# Patient Record
Sex: Female | Born: 1935 | Race: White | Hispanic: No | Marital: Married | Smoking: Former smoker
Health system: Southern US, Community
[De-identification: ages and names within clinical notes are randomized; demographics above are authoritative.]

## PROBLEM LIST (undated history)

## (undated) ENCOUNTER — Emergency Department (HOSPITAL_COMMUNITY): Admission: EM | Payer: Medicare HMO

## (undated) DIAGNOSIS — K635 Polyp of colon: Secondary | ICD-10-CM

## (undated) DIAGNOSIS — I10 Essential (primary) hypertension: Secondary | ICD-10-CM

## (undated) DIAGNOSIS — I509 Heart failure, unspecified: Secondary | ICD-10-CM

## (undated) DIAGNOSIS — K219 Gastro-esophageal reflux disease without esophagitis: Secondary | ICD-10-CM

## (undated) DIAGNOSIS — K8020 Calculus of gallbladder without cholecystitis without obstruction: Secondary | ICD-10-CM

## (undated) DIAGNOSIS — E119 Type 2 diabetes mellitus without complications: Secondary | ICD-10-CM

## (undated) DIAGNOSIS — A059 Bacterial foodborne intoxication, unspecified: Secondary | ICD-10-CM

## (undated) DIAGNOSIS — F419 Anxiety disorder, unspecified: Secondary | ICD-10-CM

## (undated) DIAGNOSIS — K5790 Diverticulosis of intestine, part unspecified, without perforation or abscess without bleeding: Secondary | ICD-10-CM

## (undated) DIAGNOSIS — C4492 Squamous cell carcinoma of skin, unspecified: Secondary | ICD-10-CM

## (undated) DIAGNOSIS — E669 Obesity, unspecified: Secondary | ICD-10-CM

## (undated) DIAGNOSIS — E785 Hyperlipidemia, unspecified: Secondary | ICD-10-CM

## (undated) DIAGNOSIS — I214 Non-ST elevation (NSTEMI) myocardial infarction: Secondary | ICD-10-CM

## (undated) HISTORY — PX: CHOLECYSTECTOMY: SHX55

## (undated) HISTORY — DX: Hyperlipidemia, unspecified: E78.5

## (undated) HISTORY — PX: ABDOMINAL HYSTERECTOMY: SHX81

## (undated) HISTORY — DX: Squamous cell carcinoma of skin, unspecified: C44.92

## (undated) HISTORY — PX: REPLACEMENT TOTAL KNEE BILATERAL: SUR1225

## (undated) HISTORY — DX: Essential (primary) hypertension: I10

## (undated) HISTORY — DX: Anxiety disorder, unspecified: F41.9

## (undated) HISTORY — DX: Non-ST elevation (NSTEMI) myocardial infarction: I21.4

## (undated) HISTORY — DX: Polyp of colon: K63.5

## (undated) HISTORY — DX: Type 2 diabetes mellitus without complications: E11.9

## (undated) HISTORY — DX: Calculus of gallbladder without cholecystitis without obstruction: K80.20

## (undated) HISTORY — DX: Gastro-esophageal reflux disease without esophagitis: K21.9

## (undated) HISTORY — DX: Diverticulosis of intestine, part unspecified, without perforation or abscess without bleeding: K57.90

## (undated) HISTORY — DX: Obesity, unspecified: E66.9

## (undated) HISTORY — DX: Bacterial foodborne intoxication, unspecified: A05.9

## (undated) HISTORY — PX: JOINT REPLACEMENT: SHX530

## (undated) HISTORY — PX: BREAST BIOPSY: SHX20

---
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
## 2015-12-25 LAB — BASIC METABOLIC PANEL
BUN: 17 (ref 4–21)
BUN: 17 (ref 4–21)
Creatinine: 0.8 (ref 0.5–1.1)
Creatinine: 0.8 (ref <=1.1)
GLUCOSE: 156
GLUCOSE: 156
Potassium: 4.7 (ref 3.4–5.3)
Potassium: 4.7 (ref 3.4–5.3)
SODIUM: 144 (ref 137–147)
SODIUM: 144 (ref 137–147)

## 2015-12-25 LAB — HEMOGLOBIN A1C
HEMOGLOBIN A1C: 7.1
Hemoglobin A1C: 7.1

## 2015-12-25 LAB — MICROALBUMIN, URINE
Microalb, Ur: 18
Microalb, Ur: 18

## 2016-07-14 LAB — HM DEXA SCAN

## 2016-07-31 LAB — BASIC METABOLIC PANEL
BUN: 14 (ref 4–21)
Creatinine: 0.8 (ref 0.5–1.1)
Glucose: 213
Potassium: 4.2 (ref 3.4–5.3)
Sodium: 142 (ref 137–147)

## 2016-07-31 LAB — CBC AND DIFFERENTIAL
HEMATOCRIT: 37 (ref 36–46)
HEMOGLOBIN: 12.7 (ref 12.0–16.0)
PLATELETS: 188 (ref 150–399)
WBC: 5.8

## 2016-07-31 LAB — HEMOGLOBIN A1C: Hemoglobin A1C: 7.4

## 2016-07-31 LAB — LIPID PANEL
Cholesterol: 101 (ref 0–200)
HDL: 33 — AB (ref 35–70)
LDL CALC: 59
TRIGLYCERIDES: 91 (ref 40–160)

## 2016-07-31 LAB — HEPATIC FUNCTION PANEL
ALK PHOS: 98 (ref 25–125)
ALT: 22 (ref 7–35)
AST: 17 (ref 13–35)
BILIRUBIN, TOTAL: 0.2

## 2016-10-19 ENCOUNTER — Ambulatory Visit: Payer: Self-pay | Admitting: Family Medicine

## 2017-01-13 LAB — CBC AND DIFFERENTIAL
HCT: 41 (ref 36–46)
Hemoglobin: 13 (ref 12.0–16.0)
Platelets: 220 (ref 150–399)
WBC: 10

## 2017-01-13 LAB — BASIC METABOLIC PANEL
BUN: 17 (ref 4–21)
CREATININE: 0.9 (ref 0.5–1.1)
GLUCOSE: 162
Potassium: 4 (ref 3.4–5.3)
Sodium: 140 (ref 137–147)

## 2017-01-13 LAB — HEPATIC FUNCTION PANEL
ALK PHOS: 93 (ref 25–125)
ALT: 32 (ref 7–35)
AST: 28 (ref 13–35)
BILIRUBIN, TOTAL: 0.4

## 2017-01-13 LAB — HEMOGLOBIN A1C: Hemoglobin A1C: 8.1

## 2017-01-13 LAB — TSH: TSH: 2.35 (ref 0.41–5.90)

## 2017-04-22 ENCOUNTER — Encounter: Payer: Self-pay | Admitting: Adult Health

## 2017-04-22 ENCOUNTER — Ambulatory Visit (INDEPENDENT_AMBULATORY_CARE_PROVIDER_SITE_OTHER): Payer: Medicare Other | Admitting: Adult Health

## 2017-04-22 DIAGNOSIS — E785 Hyperlipidemia, unspecified: Secondary | ICD-10-CM | POA: Insufficient documentation

## 2017-04-22 DIAGNOSIS — K219 Gastro-esophageal reflux disease without esophagitis: Secondary | ICD-10-CM | POA: Insufficient documentation

## 2017-04-22 DIAGNOSIS — F419 Anxiety disorder, unspecified: Secondary | ICD-10-CM | POA: Diagnosis not present

## 2017-04-22 DIAGNOSIS — E1159 Type 2 diabetes mellitus with other circulatory complications: Secondary | ICD-10-CM | POA: Insufficient documentation

## 2017-04-22 DIAGNOSIS — R7309 Other abnormal glucose: Secondary | ICD-10-CM | POA: Diagnosis not present

## 2017-04-22 DIAGNOSIS — I10 Essential (primary) hypertension: Secondary | ICD-10-CM | POA: Insufficient documentation

## 2017-04-22 MED ORDER — LORAZEPAM 1 MG PO TABS: 1.0000 mg | ORAL_TABLET | Freq: Every day | ORAL | 0 refills | Status: DC | PRN

## 2017-04-22 NOTE — Assessment & Plan Note (Signed)
She has been pre-diabetic > 5 years then last A1c April 2018 was >8-her Metformin was increased to 1000mg  Q HS-she is tolerating well and denies SE/hypoglycemia. Will re-check A1c 2 months and will adjust meds as indicated. Provided info on Diabetic diet and instructed to check AM BS and bring log at next appt.

## 2017-04-22 NOTE — Progress Notes (Signed)
Subjective:    Patient ID: Andrea Brooks, female    DOB: 20-Apr-1936, 81 y.o.   MRN: [id number]  HPI:  [name] presents to establish as a new pt.  She is a very pleasant 81 year old female.  PMH:  HTN, GERD, HL, impaired glucose metabolism, and anxiety.  She recently moved from [place] and is living with her daughter while their home is being renovated-which has caused acute increase in daily stress/anxiety.  She has been taking Lorazepam > 15 years and denies sedation or abuse of med (she has not record in [place] Controlled Substance Database).  She denies tobacco/ETOH use.  She has been pre-diabetic > 5 years then last A1c April 2018 was >8-her Metformin was increased to 1000mg  Q HS-she is tolerating well and denies SE/hypoglycemia.  She denies acute pain and only active compliant and anxiety.  Patient Care Team    Relationship Specialty Notifications Start End  Esaw Grandchild, NP PCP - General Family Medicine  04/22/17     Patient Active Problem List   Diagnosis Date Noted  . Hyperlipidemia 04/22/2017  . GERD (gastroesophageal reflux disease) 04/22/2017  . Impaired glucose metabolism 04/22/2017  . HTN (hypertension) 04/22/2017  . Anxiety 04/22/2017     Past Medical History:  Diagnosis Date  . Diabetes mellitus without complication ([place])   . Hyperlipidemia   . Hypertension      Past Surgical History:  Procedure Laterality Date  . ABDOMINAL HYSTERECTOMY     total  . CHOLECYSTECTOMY    . JOINT REPLACEMENT Bilateral    knee     Family History  Problem Relation Age of Onset  . Cancer Mother        colon  . Hypertension Mother   . Stroke Father   . Stroke Sister   . Stroke Brother   . Graves' disease Daughter   . Cancer Paternal Aunt        breast  . Graves' disease Sister      History  Drug Use No     History  Alcohol Use No     History  Smoking Status  . Former Smoker  . Packs/day: 1.00  . Years: 2.00  . Quit date: 11/24/1959  Smokeless Tobacco   . Never Used     Outpatient Encounter Prescriptions as of 04/22/2017  Medication Sig  . aspirin EC 81 MG tablet Take 81 mg by mouth daily.  Marland Kitchen ezetimibe-simvastatin (VYTORIN) 10-20 MG tablet Take 1 tablet by mouth daily.  Marland Kitchen LORazepam (ATIVAN) 1 MG tablet Take 1 tablet (1 mg total) by mouth daily as needed for anxiety.  . metFORMIN (GLUCOPHAGE) 500 MG tablet Take 1,000 mg by mouth 2 (two) times daily with a meal.  . omeprazole (PRILOSEC) 10 MG capsule Take 10 mg by mouth daily as needed.  . valsartan (DIOVAN) 160 MG tablet Take 160 mg by mouth daily.  . [DISCONTINUED] LORazepam (ATIVAN) 1 MG tablet Take 1 mg by mouth every 8 (eight) hours as needed for anxiety.   No facility-administered encounter medications on file as of 04/22/2017.     Allergies: Patient has no known allergies.  Body mass index is 35.72 kg/m.  Blood pressure (!) 147/83, pulse 86, height 5' 3.75" (1.619 m), weight 206 lb 8 oz (93.7 kg).     Review of Systems  Constitutional: Positive for fatigue. Negative for activity change, appetite change, chills, diaphoresis, fever and unexpected weight change.  HENT: Negative for congestion.   Eyes: Negative  for visual disturbance.  Respiratory: Negative for cough, chest tightness, shortness of breath, wheezing and stridor.   Cardiovascular: Negative for chest pain, palpitations and leg swelling.  Gastrointestinal: Negative for abdominal distention, abdominal pain, blood in stool, constipation, diarrhea, nausea and vomiting.  Endocrine: Negative for cold intolerance, heat intolerance, polydipsia, polyphagia and polyuria.  Genitourinary: Negative for difficulty urinating, flank pain and hematuria.  Musculoskeletal: Positive for arthralgias, joint swelling and myalgias. Negative for back pain, gait problem, neck pain and neck stiffness.  Skin: Negative for color change, pallor, rash and wound.  Allergic/Immunologic: Negative for immunocompromised state.  Neurological:  Negative for dizziness, tremors, weakness, light-headedness and headaches.  Hematological: Does not bruise/bleed easily.  Psychiatric/Behavioral: Negative for agitation, behavioral problems, confusion, decreased concentration, dysphoric mood, hallucinations, self-injury, sleep disturbance and suicidal ideas. The patient is nervous/anxious. The patient is not hyperactive.        Objective:   Physical Exam  Constitutional: She is oriented to person, place, and time. She appears well-developed and well-nourished. No distress.  HENT:  Head: Normocephalic and atraumatic.  Right Ear: External ear normal.  Left Ear: External ear normal.  Eyes: Conjunctivae are normal. Pupils are equal, round, and reactive to light.  Neck: Normal range of motion. Neck supple.  Cardiovascular: Normal rate, regular rhythm, normal heart sounds and intact distal pulses.   No murmur heard. Pulmonary/Chest: Effort normal and breath sounds normal. No respiratory distress. She has no wheezes. She has no rales. She exhibits no tenderness.  Lymphadenopathy:    She has no cervical adenopathy.  Neurological: She is alert and oriented to person, place, and time. Coordination normal.  Skin: Skin is warm and dry. No rash noted. She is not diaphoretic. No erythema. No pallor.  Psychiatric: She has a normal mood and affect. Her behavior is normal. Judgment and thought content normal.  Nursing note and vitals reviewed.         Assessment & Plan:   1. Hyperlipidemia, unspecified hyperlipidemia type   2. Gastroesophageal reflux disease, esophagitis presence not specified   3. Impaired glucose metabolism   4. Essential hypertension   5. Anxiety     Impaired glucose metabolism She has been pre-diabetic > 5 years then last A1c April 2018 was >8-her Metformin was increased to 1000mg  Q HS-she is tolerating well and denies SE/hypoglycemia. Will re-check A1c 2 months and will adjust meds as indicated. Provided info on  Diabetic diet and instructed to check AM BS and bring log at next appt.  GERD (gastroesophageal reflux disease) Continue Omeprazole daily. Avoid acidic foods.  Hyperlipidemia Continue Vytorin 10/20 daily. Will obtain last lipid panel from previous PCP.  HTN (hypertension) Continue valsartan 160mg  daily. Reduce sodium intake, increase water intake.  Anxiety  She has been taking Lorazepam > 15 years and denies sedation or abuse of med (she has not record in [place] Controlled Substance Database).  She denies tobacco/ETOH use. Recent move has increased acute stress/anxiety. Discussed this class of medication is not recommended for geriatric pt's. Pt prefers to continue since med control sx's well and she has not had SE. 30d Lorazepam Rx provided.    FOLLOW-UP:  Return in about 2 months (around 06/22/2017) for Regular Follow Up, HTN, Diabetes, General Anxiety Disorder.

## 2017-04-22 NOTE — Patient Instructions (Addendum)
Carbohydrate Counting for Diabetes Mellitus, Adult Carbohydrate counting is a method for keeping track of how many carbohydrates you eat. Eating carbohydrates naturally increases the amount of sugar (glucose) in the blood. Counting how many carbohydrates you eat helps keep your blood glucose within normal limits, which helps you manage your diabetes (diabetes mellitus). It is important to know how many carbohydrates you can safely have in each meal. This is different for every person. A diet and nutrition specialist (registered dietitian) can help you make a meal plan and calculate how many carbohydrates you should have at each meal and snack. Carbohydrates are found in the following foods:  Grains, such as breads and cereals.  Dried beans and soy products.  Starchy vegetables, such as potatoes, peas, and corn.  Fruit and fruit juices.  Milk and yogurt.  Sweets and snack foods, such as cake, cookies, candy, chips, and soft drinks.  How do I count carbohydrates? There are two ways to count carbohydrates in food. You can use either of the methods or a combination of both. Reading "Nutrition Facts" on packaged food The "Nutrition Facts" list is included on the labels of almost all packaged foods and beverages in the [place] It includes:  The serving size.  Information about nutrients in each serving, including the grams (g) of carbohydrate per serving.  To use the "Nutrition Facts":  Decide how many servings you will have.  Multiply the number of servings by the number of carbohydrates per serving.  The resulting number is the total amount of carbohydrates that you will be having.  Learning standard serving sizes of other foods When you eat foods containing carbohydrates that are not packaged or do not include "Nutrition Facts" on the label, you need to measure the servings in order to count the amount of carbohydrates:  Measure the foods that you will eat with a food scale or  measuring cup, if needed.  Decide how many standard-size servings you will eat.  Multiply the number of servings by 15. Most carbohydrate-rich foods have about 15 g of carbohydrates per serving. ? For example, if you eat 8 oz (170 g) of strawberries, you will have eaten 2 servings and 30 g of carbohydrates (2 servings x 15 g = 30 g).  For foods that have more than one food mixed, such as soups and casseroles, you must count the carbohydrates in each food that is included.  The following list contains standard serving sizes of common carbohydrate-rich foods. Each of these servings has about 15 g of carbohydrates:   hamburger bun or  English muffin.   oz (15 mL) syrup.   oz (14 g) jelly.  1 slice of bread.  1 six-inch tortilla.  3 oz (85 g) cooked rice or pasta.  4 oz (113 g) cooked dried beans.  4 oz (113 g) starchy vegetable, such as peas, corn, or potatoes.  4 oz (113 g) hot cereal.  4 oz (113 g) mashed potatoes or  of a large baked potato.  4 oz (113 g) canned or frozen fruit.  4 oz (120 mL) fruit juice.  4-6 crackers.  6 chicken nuggets.  6 oz (170 g) unsweetened dry cereal.  6 oz (170 g) plain fat-free yogurt or yogurt sweetened with artificial sweeteners.  8 oz (240 mL) milk.  8 oz (170 g) fresh fruit or one small piece of fruit.  24 oz (680 g) popped popcorn.  Example of carbohydrate counting Sample meal  3 oz (85 g) chicken breast.    6 oz (170 g) brown rice.  4 oz (113 g) corn.  8 oz (240 mL) milk.  8 oz (170 g) strawberries with sugar-free whipped topping. Carbohydrate calculation 1. Identify the foods that contain carbohydrates: ? Rice. ? Corn. ? Milk. ? Strawberries. 2. Calculate how many servings you have of each food: ? 2 servings rice. ? 1 serving corn. ? 1 serving milk. ? 1 serving strawberries. 3. Multiply each number of servings by 15 g: ? 2 servings rice x 15 g = 30 g. ? 1 serving corn x 15 g = 15 g. ? 1 serving milk x 15  g = 15 g. ? 1 serving strawberries x 15 g = 15 g. 4. Add together all of the amounts to find the total grams of carbohydrates eaten: ? 30 g + 15 g + 15 g + 15 g = 75 g of carbohydrates total. This information is not intended to replace advice given to you by your health care provider. Make sure you discuss any questions you have with your health care provider. Document Released: 11/09/2005 Document Revised: 05/29/2016 Document Reviewed: 04/22/2016 Elsevier Interactive Patient Education  2018 [place].   Blood Glucose Monitoring, Adult Monitoring your blood sugar (glucose) helps you manage your diabetes. It also helps you and your health care provider determine how well your diabetes management plan is working. Blood glucose monitoring involves checking your blood glucose as often as directed, and keeping a record (log) of your results over time. Why should I monitor my blood glucose? Checking your blood glucose regularly can:  Help you understand how food, exercise, illnesses, and medicines affect your blood glucose.  Let you know what your blood glucose is at any time. You can quickly tell if you are having low blood glucose (hypoglycemia) or high blood glucose (hyperglycemia).  Help you and your health care provider adjust your medicines as needed.  When should I check my blood glucose? Follow instructions from your health care provider about how often to check your blood glucose. This may depend on:  The type of diabetes you have.  How well-controlled your diabetes is.  Medicines you are taking.  If you have type 1 diabetes:  Check your blood glucose at least 2 times a day.  Also check your blood glucose: ? Before every insulin injection. ? Before and after exercise. ? Between meals. ? 2 hours after a meal. ? Occasionally between 2:00 a.m. and 3:00 a.m., as directed. ? Before potentially dangerous tasks, like driving or using heavy machinery. ? At bedtime.  You may  need to check your blood glucose more often, up to 6-10 times a day: ? If you use an insulin pump. ? If you need multiple daily injections (MDI). ? If your diabetes is not well-controlled. ? If you are ill. ? If you have a history of severe hypoglycemia. ? If you have a history of not knowing when your blood glucose is getting low (hypoglycemia unawareness). If you have type 2 diabetes:  If you take insulin or other diabetes medicines, check your blood glucose at least 2 times a day.  If you are on intensive insulin therapy, check your blood glucose at least 4 times a day. Occasionally, you may also need to check between 2:00 a.m. and 3:00 a.m., as directed.  Also check your blood glucose: ? Before and after exercise. ? Before potentially dangerous tasks, like driving or using heavy machinery.  You may need to check your blood glucose more often  if: ? Your medicine is being adjusted. ? Your diabetes is not well-controlled. ? You are ill. What is a blood glucose log?  A blood glucose log is a record of your blood glucose readings. It helps you and your health care provider: ? Look for patterns in your blood glucose over time. ? Adjust your diabetes management plan as needed.  Every time you check your blood glucose, write down your result and notes about things that may be affecting your blood glucose, such as your diet and exercise for the day.  Most glucose meters store a record of glucose readings in the meter. Some meters allow you to download your records to a computer. How do I check my blood glucose? Follow these steps to get accurate readings of your blood glucose: Supplies needed   Blood glucose meter.  Test strips for your meter. Each meter has its own strips. You must use the strips that come with your meter.  A needle to prick your finger (lancet). Do not use lancets more than once.  A device that holds the lancet (lancing device).  A journal or log book to write  down your results. Procedure  Wash your hands with soap and water.  Prick the side of your finger (not the tip) with the lancet. Use a different finger each time.  Gently rub the finger until a small drop of blood appears.  Follow instructions that come with your meter for inserting the test strip, applying blood to the strip, and using your blood glucose meter.  Write down your result and any notes. Alternative testing sites  Some meters allow you to use areas of your body other than your finger (alternative sites) to test your blood.  If you think you may have hypoglycemia, or if you have hypoglycemia unawareness, do not use alternative sites. Use your finger instead.  Alternative sites may not be as accurate as the fingers, because blood flow is slower in these areas. This means that the result you get may be delayed, and it may be different from the result that you would get from your finger.  The most common alternative sites are: ? Forearm. ? Thigh. ? Palm of the hand. Additional tips  Always keep your supplies with you.  If you have questions or need help, all blood glucose meters have a 24-hour "hotline" number that you can call. You may also contact your health care provider.  After you use a few boxes of test strips, adjust (calibrate) your blood glucose meter by following instructions that came with your meter. This information is not intended to replace advice given to you by your health care provider. Make sure you discuss any questions you have with your health care provider.   Document Released: 11/12/2003 Document Revised: 05/29/2016 Document Reviewed: 04/20/2016 Elsevier Interactive Patient Education  2017 [place] all medications as directed. [place] for Lorazepam provided. Please bring blood sugar log with you at next appt in 2 months. Please call clinic with any questions/concerns.

## 2017-04-22 NOTE — Assessment & Plan Note (Signed)
Continue Vytorin 10/20 daily. Will obtain last lipid panel from previous PCP.

## 2017-04-22 NOTE — Assessment & Plan Note (Addendum)
She has been taking Lorazepam > 15 years and denies sedation or abuse of med (she has not record in [place] Controlled Substance Database).  She denies tobacco/ETOH use. Recent move has increased acute stress/anxiety. Discussed this class of medication is not recommended for geriatric pt's. Pt prefers to continue since med control sx's well and she has not had SE. 30d Lorazepam Rx provided.

## 2017-04-22 NOTE — Assessment & Plan Note (Signed)
Continue valsartan 160mg  daily. Reduce sodium intake, increase water intake.

## 2017-04-22 NOTE — Assessment & Plan Note (Signed)
Continue Omeprazole daily. Avoid acidic foods.

## 2017-05-20 ENCOUNTER — Telehealth: Payer: Self-pay

## 2017-05-20 NOTE — Telephone Encounter (Signed)
Prior authorization for lorazepam done via cover my meds.  Medication approved, pharmacy aware.

## 2017-05-27 LAB — HM DIABETES EYE EXAM

## 2017-06-24 ENCOUNTER — Encounter: Payer: Self-pay | Admitting: Adult Health

## 2017-06-24 ENCOUNTER — Ambulatory Visit (INDEPENDENT_AMBULATORY_CARE_PROVIDER_SITE_OTHER): Payer: Medicare Other | Admitting: Adult Health

## 2017-06-24 VITALS — BP 134/74 | HR 77 | Ht 63.75 in | Wt 197.2 lb

## 2017-06-24 DIAGNOSIS — M546 Pain in thoracic spine: Secondary | ICD-10-CM | POA: Insufficient documentation

## 2017-06-24 DIAGNOSIS — I10 Essential (primary) hypertension: Secondary | ICD-10-CM | POA: Diagnosis not present

## 2017-06-24 DIAGNOSIS — E785 Hyperlipidemia, unspecified: Secondary | ICD-10-CM | POA: Diagnosis not present

## 2017-06-24 DIAGNOSIS — R7309 Other abnormal glucose: Secondary | ICD-10-CM | POA: Diagnosis not present

## 2017-06-24 DIAGNOSIS — R195 Other fecal abnormalities: Secondary | ICD-10-CM | POA: Insufficient documentation

## 2017-06-24 DIAGNOSIS — F419 Anxiety disorder, unspecified: Secondary | ICD-10-CM | POA: Diagnosis not present

## 2017-06-24 DIAGNOSIS — G8929 Other chronic pain: Secondary | ICD-10-CM

## 2017-06-24 LAB — POCT GLYCOSYLATED HEMOGLOBIN (HGB A1C): Hemoglobin A1C: 6.4

## 2017-06-24 NOTE — Assessment & Plan Note (Signed)
BP at goal 134/74 Continue valsartan 160mg  daily. Denies cardiac sx's at present.

## 2017-06-24 NOTE — Assessment & Plan Note (Signed)
Continue heart healthy diet and still awaiting labs from previous PCP. CPE in 4 months.

## 2017-06-24 NOTE — Assessment & Plan Note (Addendum)
A1c today 6.4 Reduced Metformin 500mg  2 tabs BID to 1 tab BID. She has lost 10lbs since last OV Continue heart healthy diet and daily walking. Diabetic foot exam normal today. Will re-check A1c in 4 months at her CPE.

## 2017-06-24 NOTE — Assessment & Plan Note (Signed)
Very well controlled, only required single dose of Lorazepam 1mg  in May 2018.

## 2017-06-24 NOTE — Assessment & Plan Note (Signed)
She was hospitalized 2014 for pneumonia and strong/constant cough caused R thoracic pain that eventually resolved, since then it will re-emerge with strenuous exertion (ie. Lifting boxes/moving that last few months). A few weeks ago R thoracic "soreness" developed rated 8/10 she has been treating with heating pad and OTC Acetaminophen.  Since the move completed last month, the soreness resolved.  She just wanted to make mention in case is occurs again.

## 2017-06-24 NOTE — Patient Instructions (Signed)

## 2017-06-24 NOTE — Progress Notes (Signed)
Subjective:    Patient ID: Andrea Brooks, female    DOB: Jan 21, 1936, 81 y.o.   MRN: [id number]  HPI:  Andrea Brooks is here for f/u:  HTN, T2D.  She has been taking Metformin 1000mg  BID, following a heart healthy diet, performing daily lifting/walking/moving boxes due to a move/home remodel since April 2018.  She has lost 10lbs since last OV and A1c today 6.4 (last A1c >8 in Spring 2018)-GREAT JOB!  She continues to take Vytorin10/20mg , Omeprazole 10mg , Diovan 160mg  daily. She has only required one dose of Lorazepam 1mg  Q HS in May.  She and her husband now live in 1800 sq foot "in law suite" attached to her daughter's home.  She  Reports am BS running 110-130s, denies episodes of hypoglycemia.  She denies EOTH/tobacco use. Two acute complaints today: 1)   She was hospitalized 2014 for pneumonia and strong/constant cough caused R thoracic pain that eventually resolved, since then it will re-emerge with strenuous exertion (ie. Lifting boxes/moving that last few months). A few weeks ago R thoracic "soreness" developed rated 8/10 she has been treating with heating pad and OTC Acetaminophen.  Since the move completed last month, the soreness resolved.  She just wanted to make mention in case is occurs again. 2) Loose stools accompanied with "tightness around my anus" that occurs 2-3 times a week in the morning.  She denies abdominal pain, or blood in urine/stool.  Andrea Brooks' last colonoscopy revealed a polyp and her repeat screening is due 2019.  Her mother had colon ca.  She had 10 lb wt loss since last OV, however she has been improving diet, Metformin dosage was increased in Spring, and she has been moving a lot more than usual with the move, home re-model.  Patient Care Team    Relationship Specialty Notifications Start End  Esaw Grandchild, NP PCP - General Family Medicine  04/22/17     Patient Active Problem List   Diagnosis Date Noted  . Loose stools 06/24/2017  . Right-sided thoracic back pain  06/24/2017  . Hyperlipidemia 04/22/2017  . GERD (gastroesophageal reflux disease) 04/22/2017  . Impaired glucose metabolism 04/22/2017  . HTN (hypertension) 04/22/2017  . Anxiety 04/22/2017     Past Medical History:  Diagnosis Date  . Diabetes mellitus without complication ([place])   . Hyperlipidemia   . Hypertension      Past Surgical History:  Procedure Laterality Date  . ABDOMINAL HYSTERECTOMY     total  . CHOLECYSTECTOMY    . JOINT REPLACEMENT Bilateral    knee     Family History  Problem Relation Age of Onset  . Cancer Mother        colon  . Hypertension Mother   . Stroke Father   . Stroke Sister   . Stroke Brother   . Graves' disease Daughter   . Cancer Paternal Aunt        breast  . Graves' disease Sister      History  Drug Use No     History  Alcohol Use No     History  Smoking Status  . Former Smoker  . Packs/day: 1.00  . Years: 2.00  . Quit date: 11/24/1959  Smokeless Tobacco  . Never Used     Outpatient Encounter Prescriptions as of 06/24/2017  Medication Sig  . aspirin EC 81 MG tablet Take 81 mg by mouth daily.  Marland Kitchen ezetimibe-simvastatin (VYTORIN) 10-20 MG tablet Take 1 tablet by mouth daily.  Marland Kitchen LORazepam (ATIVAN)  1 MG tablet Take 1 tablet (1 mg total) by mouth daily as needed for anxiety.  . metFORMIN (GLUCOPHAGE) 500 MG tablet Take 500 mg by mouth 2 (two) times daily with a meal.  . omeprazole (PRILOSEC) 10 MG capsule Take 10 mg by mouth daily as needed.  . valsartan (DIOVAN) 160 MG tablet Take 160 mg by mouth daily.   No facility-administered encounter medications on file as of 06/24/2017.     Allergies: Patient has no known allergies.  Body mass index is 34.12 kg/m.  Blood pressure 134/74, pulse 77, height 5' 3.75" (1.619 m), weight 197 lb 3.2 oz (89.4 kg).     Review of Systems  Constitutional: Positive for fatigue. Negative for activity change, appetite change, chills, diaphoresis, fever and unexpected weight change.   Eyes: Negative for visual disturbance.  Respiratory: Negative for cough, chest tightness, shortness of breath, wheezing and stridor.   Cardiovascular: Negative for chest pain, palpitations and leg swelling.  Gastrointestinal: Positive for diarrhea. Negative for abdominal distention, abdominal pain, blood in stool, constipation, nausea and vomiting.  Endocrine: Negative for cold intolerance, heat intolerance, polydipsia, polyphagia and polyuria.  Genitourinary: Negative for difficulty urinating, flank pain and hematuria.  Musculoskeletal: Negative for arthralgias, back pain, gait problem, joint swelling, myalgias, neck pain and neck stiffness.  Skin: Negative for color change, pallor, rash and wound.  Allergic/Immunologic: Negative for immunocompromised state.  Neurological: Negative for dizziness, tremors, weakness and headaches.  Hematological: Does not bruise/bleed easily.  Psychiatric/Behavioral: Negative for agitation, dysphoric mood, self-injury, sleep disturbance and suicidal ideas. The patient is not nervous/anxious.        Objective:   Physical Exam  Constitutional: She is oriented to person, place, and time. She appears well-developed and well-nourished. No distress.  HENT:  Head: Normocephalic and atraumatic.  Right Ear: External ear normal.  Left Ear: External ear normal.  Eyes: Pupils are equal, round, and reactive to light. Conjunctivae are normal.  Neck: Normal range of motion. Neck supple.  Cardiovascular: Normal rate, regular rhythm, normal heart sounds and intact distal pulses.   No murmur heard. Pulmonary/Chest: Breath sounds normal. No respiratory distress. She has no wheezes. She has no rales. She exhibits no tenderness.  Abdominal: Soft. Bowel sounds are normal. She exhibits no distension and no mass. There is no tenderness. There is no rebound and no guarding.  Musculoskeletal: Normal range of motion. She exhibits no edema, tenderness or deformity.   Lymphadenopathy:    She has no cervical adenopathy.  Neurological: She is alert and oriented to person, place, and time. Coordination normal.  Skin: Skin is warm and dry. No rash noted. She is not diaphoretic. No erythema. No pallor.  Psychiatric: She has a normal mood and affect. Her behavior is normal. Judgment and thought content normal.  Nursing note and vitals reviewed.         Assessment & Plan:   1. Impaired glucose metabolism   2. Loose stools   3. Essential hypertension   4. Hyperlipidemia, unspecified hyperlipidemia type   5. Anxiety   6. Chronic right-sided thoracic back pain     Impaired glucose metabolism A1c today 6.4 Reduced Metformin 500mg  2 tabs BID to 1 tab BID. She has lost 10lbs since last OV Continue heart healthy diet and daily walking. Diabetic foot exam normal today. Will re-check A1c in 4 months at her CPE.  HTN (hypertension) BP at goal 134/74 Continue valsartan 160mg  daily. Denies cardiac sx's at present.  Hyperlipidemia Continue heart healthy diet and still  awaiting labs from previous PCP. CPE in 4 months.  Anxiety Very well controlled, only required single dose of Lorazepam 1mg  in May 2018.  Loose stools Family hx of colon ca. GI referral placed.   Right-sided thoracic back pain She was hospitalized 2014 for pneumonia and strong/constant cough caused R thoracic pain that eventually resolved, since then it will re-emerge with strenuous exertion (ie. Lifting boxes/moving that last few months). A few weeks ago R thoracic "soreness" developed rated 8/10 she has been treating with heating pad and OTC Acetaminophen.  Since the move completed last month, the soreness resolved.  She just wanted to make mention in case is occurs again.    FOLLOW-UP:  Return in about 4 months (around 10/24/2017) for Regular Follow Up, CPE.

## 2017-06-24 NOTE — Assessment & Plan Note (Signed)
Family hx of colon ca. GI referral placed.

## 2017-06-25 ENCOUNTER — Encounter: Payer: Self-pay | Admitting: Physician Assistant

## 2017-07-06 ENCOUNTER — Ambulatory Visit (INDEPENDENT_AMBULATORY_CARE_PROVIDER_SITE_OTHER): Payer: Medicare Other | Admitting: Adult Health

## 2017-07-06 ENCOUNTER — Encounter: Payer: Self-pay | Admitting: Adult Health

## 2017-07-06 ENCOUNTER — Ambulatory Visit: Payer: Medicare Other

## 2017-07-06 VITALS — BP 112/72 | HR 76 | Ht 63.75 in | Wt 197.1 lb

## 2017-07-06 DIAGNOSIS — M79671 Pain in right foot: Secondary | ICD-10-CM | POA: Diagnosis not present

## 2017-07-06 NOTE — Patient Instructions (Addendum)
Heel Spur A heel spur is a bony growth that forms on the bottom of your heel bone (calcaneus). Heel spurs are common and do not always cause pain. However, heel spurs often cause inflammation in the strong band of tissue that runs underneath the bone of your foot (plantar fascia). When this happens, you may feel pain on the bottom of your foot, near your heel. What are the causes? The cause of heel spurs is not completely understood. They may be caused by pressure on the heel. Or, they may stem from the muscle attachments (tendons) near the spur pulling on the heel. What increases the risk? You may be at risk for a heel spur if you:  Are older than 40.  Are overweight.  Have wear and tear arthritis (osteoarthritis).  Have plantar fascia inflammation.  What are the signs or symptoms? Some people have heel spurs but no symptoms. If you do have symptoms, they may include:  Pain in the bottom of your heel.  Pain that is worse when you first get out of bed.  Pain that gets worse after walking or standing.  How is this diagnosed? Your health care provider may diagnose a heel spur based on your symptoms and a physical exam. You may also have an X-ray of your foot to check for a bony growth coming from the calcaneus. How is this treated? Treatment aims to relieve the pain from the heel spur. This may include:  Stretching exercises.  Losing weight.  Wearing specific shoes, inserts, or orthotics for comfort and support.  Wearing splints at night to properly position your feet.  Taking over-the-counter medicine to relieve pain.  Being treated with high-intensity sound waves to break up the heel spur (extracorporeal shock wave therapy).  Getting steroid injections in your heel to reduce swelling and ease pain.  Having surgery if your heel spur causes long-term (chronic) pain.  Follow these instructions at home:  Take medicines only as directed by your health care provider.  Ask  your health care provider if you should use ice or cold packs on the painful areas of your heel or foot.  Avoid activities that cause you pain until you recover or as directed by your health care provider.  Stretch before exercising or being physically active.  Wear supportive shoes that fit well as directed by your health care provider. You might need to buy new shoes. Wearing old shoes or shoes that do not fit correctly may not provide the support that you need.  Lose weight if your health care provider thinks you should. This can relieve pressure on your foot that may be causing pain and discomfort. Contact a health care provider if:  Your pain continues or gets worse. This information is not intended to replace advice given to you by your health care provider. Make sure you discuss any questions you have with your health care provider. Document Released: 12/16/2005 Document Revised: 04/16/2016 Document Reviewed: 01/10/2014 Elsevier Interactive Patient Education  [employer].   We will call when xray results are available. Apply ice to R heel for 20 mins several times daily. Rest as needed. Alternate OTC Acetaminophen and Aleve per manufacturer's instructions. If symptoms worsen/persist, then please call clinic. NICE TO SEE YOU!

## 2017-07-06 NOTE — Progress Notes (Signed)
Subjective:    Patient ID: Andrea Brooks, female    DOB: 1936-06-10, 81 y.o.   MRN: [id number]  HPI:  Ms. Hintze presents with R heel pain the spontaneously began > 2 weeks ago.  She denies acute injury/accident prior to onset of pain.  The pain is localized over heel and most pronounced with the first steps after waking and standing up from seated position.  Pain is rated at 8/10 and described as a dull ache.  She was previously dx'd with plantar fasciitis a few months ago and sx's successfully treated with "frozen water bottle" rolling.  She has been resting/elevating/OTC Aleve to tx pain. She denies this type of pain ever occurring before. Husband at [facility] during [place].  Patient Care Team    Relationship Specialty Notifications Start End  Esaw Grandchild, NP PCP - General Family Medicine  04/22/17     Patient Active Problem List   Diagnosis Date Noted  . Pain of right heel 07/06/2017  . Loose stools 06/24/2017  . Right-sided thoracic back pain 06/24/2017  . Hyperlipidemia 04/22/2017  . GERD (gastroesophageal reflux disease) 04/22/2017  . Impaired glucose metabolism 04/22/2017  . HTN (hypertension) 04/22/2017  . Anxiety 04/22/2017     Past Medical History:  Diagnosis Date  . Diabetes mellitus without complication ([place])   . Hyperlipidemia   . Hypertension      Past Surgical History:  Procedure Laterality Date  . ABDOMINAL HYSTERECTOMY     total  . CHOLECYSTECTOMY    . JOINT REPLACEMENT Bilateral    knee     Family History  Problem Relation Age of Onset  . Cancer Mother        colon  . Hypertension Mother   . Stroke Father   . Stroke Sister   . Stroke Brother   . Graves' disease Daughter   . Cancer Paternal Aunt        breast  . Graves' disease Sister      History  Drug Use No     History  Alcohol Use No     History  Smoking Status  . Former Smoker  . Packs/day: 1.00  . Years: 2.00  . Quit date: 11/24/1959  Smokeless Tobacco  . Never Used      Outpatient Encounter Prescriptions as of 07/06/2017  Medication Sig  . aspirin EC 81 MG tablet Take 81 mg by mouth daily.  Marland Kitchen ezetimibe-simvastatin (VYTORIN) 10-20 MG tablet Take 1 tablet by mouth daily.  Marland Kitchen LORazepam (ATIVAN) 1 MG tablet Take 1 tablet (1 mg total) by mouth daily as needed for anxiety.  . metFORMIN (GLUCOPHAGE) 500 MG tablet Take 500 mg by mouth 2 (two) times daily with a meal.  . omeprazole (PRILOSEC) 10 MG capsule Take 10 mg by mouth daily as needed.  . valsartan (DIOVAN) 160 MG tablet Take 160 mg by mouth daily.   No facility-administered encounter medications on file as of 07/06/2017.     Allergies: Patient has no known allergies.  Body mass index is 34.1 kg/m.  Blood pressure 112/72, pulse 76, height 5' 3.75" (1.619 m), weight 197 lb 1.6 oz (89.4 kg).    Review of Systems  Constitutional: Positive for activity change and fatigue. Negative for appetite change, chills, diaphoresis, fever and unexpected weight change.  Respiratory: Negative for cough, chest tightness, shortness of breath, wheezing and stridor.   Cardiovascular: Negative for chest pain, palpitations and leg swelling.  Musculoskeletal: Positive for arthralgias, gait problem and myalgias. Negative for  back pain, joint swelling, neck pain and neck stiffness.  Hematological: Does not bruise/bleed easily.  Psychiatric/Behavioral: Positive for sleep disturbance.       Objective:   Physical Exam  Constitutional: She is oriented to person, place, and time. She appears well-developed and well-nourished. No distress.  HENT:  Head: Normocephalic and atraumatic.  Right Ear: External ear normal.  Left Ear: External ear normal.  Eyes: Pupils are equal, round, and reactive to light. Conjunctivae are normal.  Musculoskeletal: Normal range of motion. She exhibits tenderness. She exhibits no edema or deformity.       Right ankle: Achilles tendon exhibits no pain, no defect and normal Thompson's test  results.       Right foot: There is tenderness and bony tenderness. There is normal range of motion, no swelling, normal capillary refill, no crepitus, no deformity and no laceration.       Feet:  TTP over heel  Neurological: She is alert and oriented to person, place, and time. Coordination normal.  Skin: Skin is warm and dry. No rash noted. She is not diaphoretic. No erythema. No pallor.  Psychiatric: She has a normal mood and affect. Her behavior is normal. Judgment and thought content normal.          Assessment & Plan:   1. Pain of right heel     Pain of right heel XRay Results: FINDINGS: AP and lateral views of the hindfoot only are submitted. The AP view is limited due to technical fractures. The lateral view reveals normal contour of the calcaneus and talus. There is is small plantar calcaneal spur as well as Achilles region spur. There is no acute bony abnormality of the calcaneus. Limited visualization of the ankle reveals preservation of the joint mortise. The soft tissues of the heel are unremarkable. IMPRESSION: Small plantar and Achilles region calcaneal spurs. No acute bony Abnormality. TX:  Stretching exercises.  Losing weight.  Wearing specific shoes, inserts, or orthotics for comfort and support.  Wearing splints at night to properly position your feet.  Taking over-the-counter medicine to relieve pain.  If pain continues then we can discuss more aggressive therapy-  1) extracorporeal shock eave therapy 2) cortisone injections 3) referral for surgery.  Pt called/updated.    FOLLOW-UP:  Return if symptoms worsen or fail to improve.

## 2017-07-06 NOTE — Assessment & Plan Note (Signed)
XRay Results: FINDINGS: AP and lateral views of the hindfoot only are submitted. The AP view is limited due to technical fractures. The lateral view reveals normal contour of the calcaneus and talus. There is is small plantar calcaneal spur as well as Achilles region spur. There is no acute bony abnormality of the calcaneus. Limited visualization of the ankle reveals preservation of the joint mortise. The soft tissues of the heel are unremarkable. IMPRESSION: Small plantar and Achilles region calcaneal spurs. No acute bony Abnormality. TX:  Stretching exercises.  Losing weight.  Wearing specific shoes, inserts, or orthotics for comfort and support.  Wearing splints at night to properly position your feet.  Taking over-the-counter medicine to relieve pain.  If pain continues then we can discuss more aggressive therapy-  1) extracorporeal shock eave therapy 2) cortisone injections 3) referral for surgery.  Pt called/updated.

## 2017-07-06 NOTE — Progress Notes (Signed)
Good Afternoon Andrea Brooks, Can you please call Andrea Brooks and share that her R foot xray revealed a small planar and achilles region spurs, no acute boy abnormality noted.  To treat the pain please try:  Stretching exercises.  Losing weight.  Wearing specific shoes, inserts, or orthotics for comfort and support.  Wearing splints at night to properly position your feet.  Taking over-the-counter medicine to relieve pain. Apply ice  If pain continues then we can discuss more aggressive therapy-  1) extracorporeal shock eave therapy 2) cortisone injections 3) referral for surgery

## 2017-07-13 ENCOUNTER — Ambulatory Visit (INDEPENDENT_AMBULATORY_CARE_PROVIDER_SITE_OTHER): Payer: Medicare Other | Admitting: Physician Assistant

## 2017-07-13 ENCOUNTER — Other Ambulatory Visit (INDEPENDENT_AMBULATORY_CARE_PROVIDER_SITE_OTHER): Payer: Medicare Other

## 2017-07-13 ENCOUNTER — Encounter: Payer: Self-pay | Admitting: Physician Assistant

## 2017-07-13 VITALS — BP 124/76 | HR 84 | Ht 63.75 in | Wt 199.5 lb

## 2017-07-13 DIAGNOSIS — Z1211 Encounter for screening for malignant neoplasm of colon: Secondary | ICD-10-CM

## 2017-07-13 DIAGNOSIS — R634 Abnormal weight loss: Secondary | ICD-10-CM

## 2017-07-13 DIAGNOSIS — Z86010 Personal history of colonic polyps: Secondary | ICD-10-CM | POA: Diagnosis not present

## 2017-07-13 DIAGNOSIS — Z860101 Personal history of adenomatous and serrated colon polyps: Secondary | ICD-10-CM

## 2017-07-13 DIAGNOSIS — R197 Diarrhea, unspecified: Secondary | ICD-10-CM | POA: Diagnosis not present

## 2017-07-13 DIAGNOSIS — Z800 Family history of malignant neoplasm of digestive organs: Secondary | ICD-10-CM | POA: Diagnosis not present

## 2017-07-13 DIAGNOSIS — R194 Change in bowel habit: Secondary | ICD-10-CM

## 2017-07-13 LAB — COMPREHENSIVE METABOLIC PANEL
ALBUMIN: 3.7 g/dL (ref 3.5–5.2)
ALK PHOS: 66 U/L (ref 39–117)
ALT: 14 U/L (ref 0–35)
AST: 19 U/L (ref 0–37)
BUN: 15 mg/dL (ref 6–23)
CALCIUM: 9.3 mg/dL (ref 8.4–10.5)
CO2: 28 mEq/L (ref 19–32)
CREATININE: 0.81 mg/dL (ref 0.40–1.20)
Chloride: 104 mEq/L (ref 96–112)
GFR: 72.5 mL/min (ref >60.00)
Glucose, Bld: 125 mg/dL — ABNORMAL HIGH (ref 70–99)
POTASSIUM: 4.2 meq/L (ref 3.5–5.1)
Sodium: 139 mEq/L (ref 135–145)
Total Bilirubin: 0.4 mg/dL (ref 0.2–1.2)
Total Protein: 7.2 g/dL (ref 6.0–8.3)

## 2017-07-13 LAB — CBC WITH DIFFERENTIAL/PLATELET
BASOS ABS: 0 10*3/uL (ref 0.0–0.1)
Basophils Relative: 0.4 % (ref 0.0–3.0)
EOS ABS: 0.1 10*3/uL (ref 0.0–0.7)
Eosinophils Relative: 1.4 % (ref 0.0–5.0)
HEMATOCRIT: 37.6 % (ref 36.0–46.0)
HEMOGLOBIN: 12.6 g/dL (ref 12.0–15.0)
LYMPHS PCT: 19.3 % (ref 12.0–46.0)
Lymphs Abs: 1.4 10*3/uL (ref 0.7–4.0)
MCHC: 33.6 g/dL (ref 30.0–36.0)
MCV: 86.8 fl (ref 78.0–100.0)
Monocytes Absolute: 0.5 10*3/uL (ref 0.1–1.0)
Monocytes Relative: 7.8 % (ref 3.0–12.0)
Neutro Abs: 5 10*3/uL (ref 1.4–7.7)
Neutrophils Relative %: 71.1 % (ref 43.0–77.0)
Platelets: 193 10*3/uL (ref 150.0–400.0)
RBC: 4.33 Mil/uL (ref 3.87–5.11)
RDW: 14.4 % (ref 11.5–15.5)
WBC: 7.1 10*3/uL (ref 4.0–10.5)

## 2017-07-13 MED ORDER — DICYCLOMINE HCL 10 MG PO CAPS: ORAL_CAPSULE | ORAL | 2 refills | Status: DC

## 2017-07-13 MED ORDER — NA SULFATE-K SULFATE-MG SULF 17.5-3.13-1.6 GM/177ML PO SOLN: ORAL | 0 refills | Status: DC

## 2017-07-13 NOTE — Progress Notes (Signed)
Subjective:    Patient ID: Andrea Brooks, female    DOB: 09/25/1936, 81 y.o.   MRN: [id number]  HPI Andrea Brooks is a pleasant 81 year old white female, new to GI today referred by Kerby Nora NP/family medicine for evaluation of loose stools and weight loss. Patient has history of hypertension, GERD, adult-onset diabetes mellitus, remote cholecystectomy, diverticulosis and history of colon polyps. She and her husband recently relocated to [place] from [place] to live near their daughter. Patient has had several colonoscopies and brought reports with her. Last colonoscopy was done in 2014 per Dr. Marin Olp with finding of sigmoid diverticulosis, and no polyps found at that time. She had colonoscopy also in 2013 and a path report from that colonoscopy showed 1 tubular adenoma 5 mm. She also had 2 adenomatous polyps removed in 2012. She reports family history of colon cancer in her mother deceased in her early 48s secondary to colon cancer. Patient also had an episode of diverticulitis about 4 years ago Patient says her current symptoms started 8 or 9 months ago. She says she has been very stressed about moving and packing up there home. She says she was working a lot during the day and definitely eating less than usual and gradually lost about 30 pounds. She also started having diarrhea which would typically occur after eating breakfast. She says occasionally later in the day she would develop extreme urgency and have another more explosive episode of diarrhea. She has not had any changes to her medication regimen. She has been on metformin long-term. Interestingly over the past couple of weeks now that they are settled in [place] her diarrhea has pretty much subsided and she's actually been a bit constipated. Her weight has also leveled off between 197 and 199 pounds. She denies any abdominal pain or cramping, no nausea no melena or hematochezia. She is concerned about colon cancer given the changes  in the bowel habits and weight loss.  Review of Systems Pertinent positive and negative review of systems were noted in the above HPI section.  All other review of systems was otherwise negative.  Outpatient Encounter Prescriptions as of 07/13/2017  Medication Sig  . aspirin EC 81 MG tablet Take 81 mg by mouth daily.  Marland Kitchen ezetimibe-simvastatin (VYTORIN) 10-20 MG tablet Take 1 tablet by mouth daily.  Marland Kitchen LORazepam (ATIVAN) 1 MG tablet Take 1 tablet (1 mg total) by mouth daily as needed for anxiety.  . metFORMIN (GLUCOPHAGE) 500 MG tablet Take 500 mg by mouth 2 (two) times daily with a meal.  . omeprazole (PRILOSEC) 10 MG capsule Take 10 mg by mouth daily as needed.  . valsartan (DIOVAN) 160 MG tablet Take 160 mg by mouth daily.  Marland Kitchen dicyclomine (BENTYL) 10 MG capsule Take 1 tab by mouth 30 min before a meal  as needed for cramping and diarrhea.  . Na Sulfate-K Sulfate-Mg Sulf 17.5-3.13-1.6 GM/180ML SOLN Take as directed for colonoscopy.   No facility-administered encounter medications on file as of 07/13/2017.    No Known Allergies Patient Active Problem List   Diagnosis Date Noted  . Pain of right heel 07/06/2017  . Loose stools 06/24/2017  . Right-sided thoracic back pain 06/24/2017  . Hyperlipidemia 04/22/2017  . GERD (gastroesophageal reflux disease) 04/22/2017  . Impaired glucose metabolism 04/22/2017  . HTN (hypertension) 04/22/2017  . Anxiety 04/22/2017   Social History   Social History  . Marital status: Married    Spouse name: N/A  . Number of children: 1  . Years of  education: N/A   Occupational History  . retired    Social History Main Topics  . Smoking status: Former Smoker    Packs/day: 1.00    Years: 2.00    Quit date: 11/24/1959  . Smokeless tobacco: Never Used  . Alcohol use No  . Drug use: No  . Sexual activity: Not Currently   Other Topics Concern  . Not on file   Social History Narrative  . No narrative on file    Andrea Brooks's family history includes  Cancer in her paternal aunt; Colon cancer in her mother; Berenice Primas' disease in her daughter and sister; Heart failure in her father; Hypertension in her mother; Stroke in her brother, father, and sister.      Objective:    Vitals:   07/13/17 0947  BP: 124/76  Pulse: 84    Physical Exam  well-developed older white female in no acute distress, very pleasant accompanied by her husband blood pressure 124/76 pulse 84 with 3, weight 199, BMI of 34.5. HEENT; nontraumatic normocephalic EOMI PERRLA sclerae anicteric, Cardiovascular; regular rate and rhythm with S1-S2 no murmur rub or gallop, Pulmonary ;clear bilaterally, Abdomen; soft, nontender no palpable mass or hepatosplenomegaly bowel sounds are present, cholecystectomy scar Rectal ;exam not done, Extremities ;no clubbing cyanosis or edema skin warm and dry, Neuropsych ;mood and affect appropriate         Assessment & Plan:   #38 81 year old white female with 8-9 month history of diarrhea generally postprandially and gradual weight loss of 30 pounds. These both coincided with significant increased stress surrounding a moved from [place] to [place]. Weight has now leveled off over the past several weeks and diarrhea has subsided. I suspect both stress-induced/IBS, however with significant family history, personal history of adenomatous colon polyps we will rule out occult colon lesion #2 positive family history of colon cancer in patient's mother #3 personal history of adenomatous colon polyps-last colonoscopy 2014 done in [place] #4 diverticulosis #5 GERD #6 adult-onset diabetes mellitus #7 hypertension  Plan; CBC with differential, CMET Patient will be scheduled for colonoscopy with Dr. Loletha Carrow . Procedure discussed in detail with the patient including risks and benefits and she is agreeable to proceed Start Bentyl 10 mg one half hour before meals on a when necessary basis. Patient is asked to contact us should she  have any significant recurrence of diarrhea, and  will monitor her weight.   Andrea Brooks S Rodolph Hagemann PA-C 07/13/2017   Cc: Esaw Grandchild, NP

## 2017-07-13 NOTE — Patient Instructions (Addendum)
Please go to the basement level to have your labs drawn.  We have sent the following medications to your pharmacy for you to pick up at your convenience: Andrea Brooks.  1. Suprep for the colonoscopy 2. Bentyl ( Dicyclomine) 10 mg.   You have been scheduled for a colonoscopy. Please follow written instructions given to you at your visit today.  Please pick up your prep supplies at the pharmacy within the next 1-3 days.  If you use inhalers (even only as needed), please bring them with you on the day of your procedure. Your physician has requested that you go to www.startemmi.com and enter the access code given to you at your visit today. This web site gives a general overview about your procedure. However, you should still follow specific instructions given to you by our office regarding your preparation for the procedure.

## 2017-07-14 NOTE — Progress Notes (Signed)
Thank you for sending this case to me. I have reviewed the entire note, and the outlined plan seems appropriate.  It does sound like stress-related IBS symptoms. Colonoscopy warranted because she had a TA polyp 5 yrs ago and to take Bx to rule out microscopic colitis.  Wilfrid Lund, MD

## 2017-07-20 ENCOUNTER — Telehealth: Payer: Self-pay

## 2017-07-20 NOTE — Telephone Encounter (Signed)
Pt received letter from [place] regarding recall of valsartan from certain manufacturers.  Pt wanted to ensure that her supply is not affected.    Contacted [facility] and spoke [place].  She stated that this pt's supply was from a manufacturer that IS NOT affected by the recall.  LVM informing pt of this information.  Charyl Bigger, CMA

## 2017-07-28 ENCOUNTER — Encounter: Payer: Self-pay | Admitting: Gastroenterology

## 2017-07-30 ENCOUNTER — Other Ambulatory Visit: Payer: Self-pay | Admitting: Adult Health

## 2017-07-30 NOTE — Telephone Encounter (Signed)
Patient left VM requesting we contact [facility] w/ new Rx for her metformin meds--no refills on file at mail order pharmacy.--- Pls  call pt if any questions. --glh

## 2017-08-02 MED ORDER — METFORMIN HCL 500 MG PO TABS: 500.0000 mg | ORAL_TABLET | Freq: Two times a day (BID) | ORAL | 2 refills | Status: DC

## 2017-08-02 NOTE — Telephone Encounter (Signed)
We have not prescribed these medications for the patient previously.  Please review and refill if appropriate.  T. Yitzel Shasteen, CMA  

## 2017-08-03 ENCOUNTER — Other Ambulatory Visit: Payer: Self-pay

## 2017-08-03 MED ORDER — VALSARTAN 160 MG PO TABS: 160.0000 mg | ORAL_TABLET | Freq: Every day | ORAL | 2 refills | Status: DC

## 2017-08-03 MED ORDER — VALSARTAN 160 MG PO TABS
160.0000 mg | ORAL_TABLET | Freq: Every day | ORAL | 2 refills | Status: DC
Start: 2017-08-03 — End: 2017-08-03

## 2017-08-03 NOTE — Telephone Encounter (Signed)
We have not prescribed these medications for the patient previously.  Please review and refill if appropriate.  T. Nelson, CMA  

## 2017-08-03 NOTE — Addendum Note (Signed)
Addended by: Fonnie Mu on: 08/03/2017 02:36 PM   Modules accepted: Orders

## 2017-08-05 ENCOUNTER — Ambulatory Visit (INDEPENDENT_AMBULATORY_CARE_PROVIDER_SITE_OTHER): Payer: Medicare Other | Admitting: Podiatry

## 2017-08-05 ENCOUNTER — Ambulatory Visit (INDEPENDENT_AMBULATORY_CARE_PROVIDER_SITE_OTHER): Payer: Medicare Other

## 2017-08-05 ENCOUNTER — Encounter: Payer: Self-pay | Admitting: Podiatry

## 2017-08-05 ENCOUNTER — Telehealth: Payer: Self-pay

## 2017-08-05 VITALS — BP 126/80 | HR 66 | Resp 16

## 2017-08-05 DIAGNOSIS — M722 Plantar fascial fibromatosis: Secondary | ICD-10-CM

## 2017-08-05 MED ORDER — MELOXICAM 15 MG PO TABS: 15.0000 mg | ORAL_TABLET | Freq: Every day | ORAL | 3 refills | Status: DC

## 2017-08-05 MED ORDER — LOSARTAN POTASSIUM 100 MG PO TABS: 100.0000 mg | ORAL_TABLET | Freq: Every day | ORAL | 3 refills | Status: DC

## 2017-08-05 NOTE — Telephone Encounter (Signed)
[facility] called stating that valsartan has been recalled by the manufacturer and is unavailable.  Andrea Brooks spoke with pharmacist and changed therapy to losartan 100mg  QD.  Charyl Bigger, CMA

## 2017-08-05 NOTE — Patient Instructions (Signed)

## 2017-08-05 NOTE — Progress Notes (Signed)
Subjective:  Patient ID: Andrea Brooks, female    DOB: Jan 14, 1936,  MRN: [id number] HPI Chief Complaint  Patient presents with  . Foot Pain    Plantar heel right - aching x 2 months, AM pain, uses a cane somedays, tried ice and advil    81 y.o. female presents with the above complaint.   She presents today with chief complaint of bilateral heel pain has been aching for 2 months states that she uses a cane on some days and she's tried ice and Advil to no avail. She states that her hemoglobin A1c was 6.8.  Past Medical History:  Diagnosis Date  . Colon polyps   . Diabetes mellitus without complication ([place])   . Diverticulosis   . Gallstones   . GERD (gastroesophageal reflux disease)   . Hyperlipidemia   . Hypertension   . Obesity    Past Surgical History:  Procedure Laterality Date  . ABDOMINAL HYSTERECTOMY     total  . CHOLECYSTECTOMY    . JOINT REPLACEMENT Bilateral    knee  . REPLACEMENT TOTAL KNEE BILATERAL      Current Outpatient Prescriptions:  .  aspirin EC 81 MG tablet, Take 81 mg by mouth daily., Disp: , Rfl:  .  dicyclomine (BENTYL) 10 MG capsule, Take 1 tab by mouth 30 min before a meal  as needed for cramping and diarrhea., Disp: 30 capsule, Rfl: 2 .  ezetimibe-simvastatin (VYTORIN) 10-20 MG tablet, Take 1 tablet by mouth daily., Disp: , Rfl:  .  LORazepam (ATIVAN) 1 MG tablet, Take 1 tablet (1 mg total) by mouth daily as needed for anxiety., Disp: 30 tablet, Rfl: 0 .  losartan (COZAAR) 100 MG tablet, Take 1 tablet (100 mg total) by mouth daily., Disp: 90 tablet, Rfl: 3 .  metFORMIN (GLUCOPHAGE) 500 MG tablet, Take 1 tablet (500 mg total) by mouth 2 (two) times daily with a meal., Disp: 180 tablet, Rfl: 2 .  Na Sulfate-K Sulfate-Mg Sulf 17.5-3.13-1.6 GM/180ML SOLN, Take as directed for colonoscopy., Disp: 354 mL, Rfl: 0 .  omeprazole (PRILOSEC) 10 MG capsule, Take 10 mg by mouth daily as needed., Disp: , Rfl:  .  valsartan (DIOVAN) 160 MG tablet, , Disp: , Rfl:     No Known Allergies Review of Systems  Musculoskeletal: Positive for gait problem.  All other systems reviewed and are negative.  Objective:   Vitals:   08/05/17 1334  BP: 126/80  Pulse: 66  Resp: 16   General AA&O x3. Patient is alert and oriented.  Vascular Dorsalis pedis and posterior tibial pulses 2/4 bilat. Brisk capillary refill to all digits. Pedal hair present.  Neurologic Epicritic sensation grossly intact.  Dermatologic No open lesions. Interspaces clear of maceration. Nails well groomed and normal in appearance.  Orthopedic: MMT 5/5 in dorsiflexion, plantarflexion, inversion, and eversion. Normal joint ROM without pain or crepitus.She has pain on palpation medial calcaneal tubercle of the right heel.    Radiographs:  Radiographs 3 views taken office today demonstrate osseously mature individual no fractures are identified. Soft tissue increasing density at the plantar fascia calcaneal insertion site is identified.  Assessment & Plan:   Assessment: Plantar fasciitis right.  Plan: We discussed the etiology pathology conservative versus surgical therapies. We also discussed appropriate shoe gear stretching exercises ice therapy issue here modifications. Injected the right heel today with Kenalog and local anesthetic. Posterior compartment pressure brace. I will follow up with her in 1 month. Also dispensed night splint.     Karel Turpen  T. [place], [place]

## 2017-08-10 ENCOUNTER — Ambulatory Visit (AMBULATORY_SURGERY_CENTER): Payer: Medicare Other | Admitting: Gastroenterology

## 2017-08-10 ENCOUNTER — Encounter: Payer: Self-pay | Admitting: Gastroenterology

## 2017-08-10 VITALS — BP 115/55 | HR 60 | Temp 97.1°F | Resp 12 | Ht 63.0 in | Wt 199.0 lb

## 2017-08-10 DIAGNOSIS — D123 Benign neoplasm of transverse colon: Secondary | ICD-10-CM

## 2017-08-10 DIAGNOSIS — Z86010 Personal history of colonic polyps: Secondary | ICD-10-CM

## 2017-08-10 DIAGNOSIS — D124 Benign neoplasm of descending colon: Secondary | ICD-10-CM | POA: Diagnosis not present

## 2017-08-10 DIAGNOSIS — Z800 Family history of malignant neoplasm of digestive organs: Secondary | ICD-10-CM

## 2017-08-10 MED ORDER — SODIUM CHLORIDE 0.9 % IV SOLN: 500.0000 mL | INTRAVENOUS | Status: DC

## 2017-08-10 NOTE — Patient Instructions (Signed)
YOU HAD AN ENDOSCOPIC PROCEDURE TODAY AT [facility]:   Refer to the procedure report that was given to you for any specific questions about what was found during the examination.  If the procedure report does not answer your questions, please call your gastroenterologist to clarify.  If you requested that your care partner not be given the details of your procedure findings, then the procedure report has been included in a sealed envelope for you to review at your convenience later.  YOU SHOULD EXPECT: Some feelings of bloating in the abdomen. Passage of more gas than usual.  Walking can help get rid of the air that was put into your GI tract during the procedure and reduce the bloating. If you had a lower endoscopy (such as a colonoscopy or flexible sigmoidoscopy) you may notice spotting of blood in your stool or on the toilet paper. If you underwent a bowel prep for your procedure, you may not have a normal bowel movement for a few days.  Please Note:  You might notice some irritation and congestion in your nose or some drainage.  This is from the oxygen used during your procedure.  There is no need for concern and it should clear up in a day or so.  SYMPTOMS TO REPORT IMMEDIATELY:   Following lower endoscopy (colonoscopy or flexible sigmoidoscopy):  Excessive amounts of blood in the stool  Significant tenderness or worsening of abdominal pains  Swelling of the abdomen that is new, acute  Fever of 100F or higher   Please see handouts given for Diverticulosis and Polyps. For urgent or emergent issues, a gastroenterologist can be reached at any hour by calling [phone].   DIET:  We do recommend a small meal at first, but then you may proceed to your regular diet.  Drink plenty of fluids but you should avoid alcoholic beverages for 24 hours.  ACTIVITY:  You should plan to take it easy for the rest of today and you should NOT DRIVE or use heavy machinery until tomorrow  (because of the sedation medicines used during the test).    FOLLOW UP: Our staff will call the number listed on your records the next business day following your procedure to check on you and address any questions or concerns that you may have regarding the information given to you following your procedure. If we do not reach you, we will leave a message.  However, if you are feeling well and you are not experiencing any problems, there is no need to return our call.  We will assume that you have returned to your regular daily activities without incident.  If any biopsies were taken you will be contacted by phone or by letter within the next 1-3 weeks.  Please call us at [phone] if you have not heard about the biopsies in 3 weeks.    SIGNATURES/CONFIDENTIALITY: You and/or your care partner have signed paperwork which will be entered into your electronic medical record.  These signatures attest to the fact that that the information above on your After Visit Summary has been reviewed and is understood.  Full responsibility of the confidentiality of this discharge information lies with you and/or your care-partner.  Thank you for letting us take care of your healthcare needs today.

## 2017-08-10 NOTE — Op Note (Signed)
[facility] Patient Name: Andrea Brooks Procedure Date: 08/10/2017 8:27 AM MRN: [id number] Endoscopist: [place]. Loletha Carrow , MD Age: 81 Referring MD:  Date of Birth: [birth year] Gender: Female Account #: [id number] Procedure:                Colonoscopy Indications:              Surveillance: Personal history of adenomatous                            polyps on last colonoscopy 5 years ago Medicines:                Monitored Anesthesia Care Procedure:                Pre-Anesthesia Assessment:                           - Prior to the procedure, a History and Physical                            was performed, and patient medications and                            allergies were reviewed. The patient's tolerance of                            previous anesthesia was also reviewed. The risks                            and benefits of the procedure and the sedation                            options and risks were discussed with the patient.                            All questions were answered, and informed consent                            was obtained. Anticoagulants: The patient has taken                            aspirin. It was decided not to withhold this                            medication prior to the procedure. ASA Grade                            Assessment: II - A patient with mild systemic                            disease. After reviewing the risks and benefits,                            the patient was deemed in satisfactory condition to  undergo the procedure.                           After obtaining informed consent, the colonoscope                            was passed under direct vision. Throughout the                            procedure, the patient's blood pressure, pulse, and                            oxygen saturations were monitored continuously. The                            Colonoscope was introduced through the anus and                           advanced to the the cecum, identified by                            appendiceal orifice and ileocecal valve. The                            colonoscopy was performed without difficulty. The                            patient tolerated the procedure well. The quality                            of the bowel preparation was excellent. The                            ileocecal valve, appendiceal orifice, and rectum                            were photographed. The quality of the bowel                            preparation was evaluated using the BBPS [facility]                            Bowel Preparation Scale) with scores of: Right                            Colon = 3, Transverse Colon = 3 and Left Colon = 3                            (entire mucosa seen well with no residual staining,                            small fragments of stool or opaque liquid). The  total BBPS score equals 9. The bowel preparation                            used was SUPREP. Scope In: 8:36:55 AM Scope Out: 8:52:48 AM Scope Withdrawal Time: 0 hours 12 minutes 32 seconds  Total Procedure Duration: 0 hours 15 minutes 53 seconds  Findings:                 The digital rectal exam findings include decreased                            sphincter tone.                           Diverticula were found in the left colon.                           Three sessile polyps were found in the descending                            colon and transverse colon. The polyps were 4 mm in                            size. These polyps were removed with a cold snare.                            Resection and retrieval were complete.                           The exam was otherwise without abnormality on                            direct and retroflexion views. Complications:            No immediate complications. Estimated Blood Loss:     Estimated blood loss was minimal. Impression:                - Decreased sphincter tone found on digital rectal                            exam.                           - Diverticulosis in the left colon.                           - Three 4 mm polyps in the descending colon and in                            the transverse colon, removed with a cold snare.                            Resected and retrieved.                           - The examination was otherwise normal  on direct                            and retroflexion views.                           The patient had been having diarrhea that resolved                            a month ago, and was suspected to have been                            IBS-related. Recommendation:           - Patient has a contact number available for                            emergencies. The signs and symptoms of potential                            delayed complications were discussed with the                            patient. Return to normal activities tomorrow.                            Written discharge instructions were provided to the                            patient.                           - Resume previous diet.                           - Continue present medications.                           - Await pathology results.                           - No repeat polyp surveillance colonoscopy due to                            age. [name], MD 08/10/2017 8:57:02 AM This report has been signed electronically.

## 2017-08-10 NOTE — Progress Notes (Signed)
Called to room to assist during endoscopic procedure.  Patient ID and intended procedure confirmed with present staff. Received instructions for my participation in the procedure from the performing physician.  

## 2017-08-10 NOTE — Progress Notes (Signed)
Spontaneous respirations throughout. VSS. Resting comfortably. To PACU on room air. Report to  RN. 

## 2017-08-11 ENCOUNTER — Telehealth: Payer: Self-pay | Admitting: *Deleted

## 2017-08-11 NOTE — Telephone Encounter (Signed)
  Follow up Call-  Call back number 08/10/2017  Post procedure Call Back phone  # [phone]  Permission to leave phone message Yes  Some recent data might be hidden     Patient questions:  Do you have a fever, pain , or abdominal swelling? No. Pain Score  0 *  Have you tolerated food without any problems? Yes.    Have you been able to return to your normal activities? Yes.    Do you have any questions about your discharge instructions: Diet   No. Medications  No. Follow up visit  No.  Do you have questions or concerns about your Care? No.  Actions: * If pain score is 4 or above: No action needed, pain <4.

## 2017-08-12 ENCOUNTER — Encounter: Payer: Self-pay | Admitting: Gastroenterology

## 2017-09-02 ENCOUNTER — Encounter: Payer: Self-pay | Admitting: Podiatry

## 2017-09-02 ENCOUNTER — Ambulatory Visit (INDEPENDENT_AMBULATORY_CARE_PROVIDER_SITE_OTHER): Payer: Medicare Other | Admitting: Podiatry

## 2017-09-02 DIAGNOSIS — M722 Plantar fascial fibromatosis: Secondary | ICD-10-CM

## 2017-09-02 NOTE — Progress Notes (Signed)
She presents today for follow-up of her plantar fasciitis to her right heel. She states that she is doing nearly 100% better or the very least 80% better.  Objective: Vital signs are stable she is alert and oriented 3. Pulses are palpable. She still retains pain to the plantar central calcaneal tubercle or central band of the plantar fascia insertion.  Assessment: Plantar fasciitis.  Plan: Injected the right heel again today with Kenalog and local anesthetic. Continue walker conservative therapies.

## 2017-09-13 ENCOUNTER — Telehealth: Payer: Self-pay

## 2017-09-13 NOTE — Telephone Encounter (Signed)
Pt brought in papers from [facility] pertaining to Losartan be used as a replacement for Valsartan since this medication has been temporarily unavailable.  Pt had questions re: which one she should use.  Advised pt that if valsartan is available, she should continue this medication, but if it is not, then she should take the losartan.  Reminded pt to NOT take both of these medications, but to only use one or the other.  Pt expressed understanding and is agreeable.  Charyl Bigger, CMA

## 2017-09-30 ENCOUNTER — Encounter: Payer: Self-pay | Admitting: Podiatry

## 2017-09-30 ENCOUNTER — Ambulatory Visit: Payer: Medicare Other | Admitting: Podiatry

## 2017-09-30 DIAGNOSIS — M722 Plantar fascial fibromatosis: Secondary | ICD-10-CM

## 2017-09-30 NOTE — Progress Notes (Signed)
She presents today for follow-up of her plantar fasciitis. She states that it hurts right here and he just does not seem to be getting any better. She states that she is 80% improved but this one spot is severely painful.  Objective: Vital signs are stable she is alert and oriented 3. Pulses are palpable. Neurologic sensorium is intact. She has severe pain on direct palpation of the medial band of plantar fascia at his insertion site right heel.  Assessment: Medial band plantar fasciitis insertion site.  Plan: I injected the area directly at the point of attachment of the calcaneus and the plantar fascia today. She states that this was exquisitely painful but she tolerated it very well. She is going continue all of her other current therapies and follow up with me in the near future.

## 2017-10-28 ENCOUNTER — Ambulatory Visit: Payer: Medicare Other | Admitting: Podiatry

## 2017-10-28 ENCOUNTER — Encounter: Payer: Self-pay | Admitting: Podiatry

## 2017-10-28 DIAGNOSIS — M722 Plantar fascial fibromatosis: Secondary | ICD-10-CM | POA: Diagnosis not present

## 2017-10-28 NOTE — Progress Notes (Signed)
She presents today with a chief complaint of plantar fasciitis.  States that the plantar medial side has healed up and feels great except for now it feels like this in the middle of the lateral aspect of the right heel.  Continues to wear plantar fascial brace.  Objective: Vital signs are stable alert and oriented x3 pulses remain palpable no calf pain.  No pain on palpation medial calcaneal tubercle.  She does however have pain on palpation to the lateral calcaneal tubercle with no overlying erythema no edema.  Assessment: Resolution of the fasciitis to the medial band of the right foot however she does have central and lateral band plantar fasciitis.  Plan: Went ahead and injected the central and lateral plantar fascial bands today after sterile Betadine skin prep with 20 mg of Kenalog and local anesthetic 5 mg of Marcaine.  She tolerated procedure well and will follow up with me in 1 month.

## 2017-11-04 ENCOUNTER — Ambulatory Visit (INDEPENDENT_AMBULATORY_CARE_PROVIDER_SITE_OTHER): Payer: Medicare Other | Admitting: Adult Health

## 2017-11-04 ENCOUNTER — Encounter: Payer: Self-pay | Admitting: Adult Health

## 2017-11-04 VITALS — BP 138/78 | HR 76 | Ht 63.75 in | Wt 200.9 lb

## 2017-11-04 DIAGNOSIS — Z1231 Encounter for screening mammogram for malignant neoplasm of breast: Secondary | ICD-10-CM

## 2017-11-04 DIAGNOSIS — E7849 Other hyperlipidemia: Secondary | ICD-10-CM | POA: Diagnosis not present

## 2017-11-04 DIAGNOSIS — Z0000 Encounter for general adult medical examination without abnormal findings: Secondary | ICD-10-CM | POA: Diagnosis not present

## 2017-11-04 DIAGNOSIS — I10 Essential (primary) hypertension: Secondary | ICD-10-CM | POA: Diagnosis not present

## 2017-11-04 DIAGNOSIS — R7309 Other abnormal glucose: Secondary | ICD-10-CM | POA: Diagnosis not present

## 2017-11-04 DIAGNOSIS — Z1239 Encounter for other screening for malignant neoplasm of breast: Secondary | ICD-10-CM

## 2017-11-04 NOTE — Progress Notes (Signed)
Subjective:   Andrea Brooks is a 81 y.o. female who presents for Medicare Annual (Subsequent) preventive examination. She denies acute complaints or any sig change in current health status.  She feels that overall her health is "very good" and she is "very happy in my life". Review of Systems: General:   Denies fever, chills, unexplained weight loss.  Optho/Auditory:   Denies visual changes, blurred vision/LOV Respiratory:   Denies SOB, DOE more than baseline levels.  Cardiovascular:   Denies chest pain, palpitations, new onset peripheral edema  Gastrointestinal:   Denies nausea, vomiting, diarrhea.  Genitourinary: Denies dysuria, freq/ urgency, flank pain or discharge from genitals.  Endocrine:     Denies hot or cold intolerance, polyuria, polydipsia. Musculoskeletal:   Denies unexplained myalgias, joint swelling, unexplained arthralgias, gait problems.  Skin:  Denies rash, suspicious lesions Neurological:     Denies dizziness, unexplained weakness, numbness  Psychiatric/Behavioral:   Denies mood changes, suicidal or homicidal ideations, hallucinations          Objective:     Vitals: BP 138/78   Pulse 76   Ht 5' 3.75" (1.619 m)   Wt 200 lb 14.4 oz (91.1 kg)   BMI 34.76 kg/m    Body mass index is 34.76 kg/m.  Advanced Directives 08/10/2017 04/22/2017  Does Patient Have a Medical Advance Directive? Yes Yes  Type of Paramedic of [place];Living will [place];Living will    Tobacco Social History   Tobacco Use  Smoking Status Former Smoker  . Packs/day: 1.00  . Years: 2.00  . Pack years: 2.00  . Last attempt to quit: 11/24/1959  . Years since quitting: 57.9  Smokeless Tobacco Never Used     Counseling given: Not Answered  Past Medical History:  Diagnosis Date  . Anxiety   . Colon polyps   . Diabetes mellitus without complication ([place])   . Diverticulosis   . Gallstones   . GERD (gastroesophageal reflux disease)    . Hyperlipidemia   . Hypertension   . Obesity    Past Surgical History:  Procedure Laterality Date  . ABDOMINAL HYSTERECTOMY     total  . CHOLECYSTECTOMY    . JOINT REPLACEMENT Bilateral    knee  . REPLACEMENT TOTAL KNEE BILATERAL     Family History  Problem Relation Age of Onset  . Hypertension Mother   . Colon cancer Mother   . Stroke Father   . Heart failure Father   . Stroke Sister   . Stroke Brother   . Graves' disease Daughter   . Cancer Paternal Aunt        breast  . Graves' disease Sister   . Bone cancer Sister   . Esophageal cancer Neg Hx   . Rectal cancer Neg Hx   . Liver cancer Neg Hx    Social History   Socioeconomic History  . Marital status: Married    Spouse name: None  . Number of children: 1  . Years of education: None  . Highest education level: None  Social Needs  . Financial resource strain: None  . Food insecurity - worry: None  . Food insecurity - inability: None  . Transportation needs - medical: None  . Transportation needs - non-medical: None  Occupational History  . Occupation: retired  Tobacco Use  . Smoking status: Former Smoker    Packs/day: 1.00    Years: 2.00    Pack years: 2.00    Last attempt to  quit: 11/24/1959    Years since quitting: 57.9  . Smokeless tobacco: Never Used  Substance and Sexual Activity  . Alcohol use: No  . Drug use: No  . Sexual activity: Not Currently  Other Topics Concern  . None  Social History Narrative  . None    Outpatient Encounter Medications as of 11/04/2017  Medication Sig  . aspirin EC 81 MG tablet Take 81 mg by mouth daily.  Marland Kitchen dicyclomine (BENTYL) 10 MG capsule Take 1 tab by mouth 30 min before a meal  as needed for cramping and diarrhea.  . ezetimibe-simvastatin (VYTORIN) 10-20 MG tablet Take 1 tablet by mouth daily.  Marland Kitchen LORazepam (ATIVAN) 1 MG tablet Take 1 tablet (1 mg total) by mouth daily as needed for anxiety.  Marland Kitchen losartan (COZAAR) 100 MG tablet Take 1 tablet (100 mg total) by  mouth daily.  . metFORMIN (GLUCOPHAGE) 500 MG tablet Take 1 tablet (500 mg total) by mouth 2 (two) times daily with a meal.  . omeprazole (PRILOSEC) 10 MG capsule Take 10 mg by mouth daily as needed.  . valsartan (DIOVAN) 160 MG tablet Take 160 mg by mouth daily.   . [DISCONTINUED] meloxicam (MOBIC) 15 MG tablet Take 1 tablet (15 mg total) by mouth daily.   Facility-Administered Encounter Medications as of 11/04/2017  Medication  . 0.9 %  sodium chloride infusion    Activities of Daily Living In your present state of health, do you have any difficulty performing the following activities: 11/04/2017  Hearing? N  Vision? N  Difficulty concentrating or making decisions? N  Walking or climbing stairs? N  Dressing or bathing? N  Doing errands, shopping? N  Some recent data might be hidden    Timed Get Up and Go performed: No  Patient Care Team: Esaw Grandchild, NP as PCP - General (Family Medicine)    Assessment:    Exercise Activities and Dietary recommendations Current Exercise Habits: Home exercise routine, Type of exercise: walking, Time (Minutes): 10, Frequency (Times/Week): 5, Weekly Exercise (Minutes/Week): 50, Intensity: Mild  Goals- Continue daily walking and heart healthy diet  Continue current medications Mammogram order placed, does not require additional screening measures at this time. Continue with Podiatrist as directed. Schedule full physical in 6 months  Fall Risk Fall Risk  11/04/2017 04/22/2017  Falls in the past year? No No   Is the patient's home free of loose throw rugs in walkways, pet beds, electrical cords, etc?   no      Grab bars in the bathroom? no      Handrails on the stairs?   yes      Adequate lighting?   yes  Depression Screen PHQ 2/9 Scores 11/04/2017 04/22/2017  PHQ - 2 Score 0 0  PHQ- 9 Score 3 -     Cognitive Function     6CIT Screen 11/04/2017  What Year? 0 points  What month? 0 points  What time? 0 points  Count back from 20  0 points  Months in reverse 0 points  Repeat phrase 0 points  Total Score 0    Immunization History  Administered Date(s) Administered  . Influenza-Unspecified 10/05/2017  . Pneumococcal Conjugate-13 10/08/2014, 10/05/2017  . Pneumococcal Polysaccharide-23 09/08/1999  . Tdap 07/14/2016  . Zoster 09/05/2012   Screening Tests Health Maintenance  Topic Date Due  . PNA vac Low Risk Adult (2 of 2 - PPSV23) 10/09/2015  . INFLUENZA VACCINE  06/23/2017  . TETANUS/TDAP  07/14/2026  . DEXA SCAN  Completed   Cancer Screenings: Lung:Low Dose CT Chest recommended if Age 59-80 years, 30 pack-year currently smoking OR have quit w/in 15years. Patient does not qualify. Breast: Up to date on Mammogram? No  - order placed Up to date of Bone Density/Dexa? Yes Colorectal: Not indicated  Additional Screenings:  Hepatitis B/HIV/Syphillis: Not indicated Hepatitis C Screening: Not indicated     Plan:    I have personally reviewed and noted the following in the patient's chart:   . Medical and social history . Use of alcohol, tobacco or illicit drugs  . Current medications and supplements . Functional ability and status . Nutritional status . Physical activity . Advanced directives . List of other physicians . Hospitalizations, surgeries, and ER visits in previous 12 months . Vitals . Screenings to include cognitive, depression, and falls . Referrals and appointments  In addition, I have reviewed and discussed with patient certain preventive protocols, quality metrics, and best practice recommendations. A written personalized care plan for preventive services as well as general preventive health recommendations were provided to patient.     Esaw Grandchild, NP  11/04/2017

## 2017-11-04 NOTE — Patient Instructions (Addendum)
Andrea Brooks , Thank you for taking time to come for your Medicare Wellness Visit. I appreciate your ongoing commitment to your health goals. Please review the following plan we discussed and let me know if I can assist you in the future.   These are the goals we discussed: Continue daily walking and heart healthy diet  Continue current medications Mammogram order placed, does not require additional screening measures at this time. Continue with Podiatrist as directed.  This is a list of the screening recommended for you and due dates:  Health Maintenance  Topic Date Due  . Pneumonia vaccines (2 of 2 - PPSV23) 10/05/2018  . Tetanus Vaccine  07/14/2026  . Flu Shot  Completed  . DEXA scan (bone density measurement)  Completed    Heart-Healthy Eating Plan Many factors influence your heart health, including eating and exercise habits. Heart (coronary) risk increases with abnormal blood fat (lipid) levels. Heart-healthy meal planning includes limiting unhealthy fats, increasing healthy fats, and making other small dietary changes. This includes maintaining a healthy body weight to help keep lipid levels within a normal range. What is my plan? Your health care provider recommends that you:  Get no more than __25___% of the total calories in your daily diet from fat.  Limit your intake of saturated fat to less than __5____% of your total calories each day.  Limit the amount of cholesterol in your diet to less than ___300___ mg per day.  What types of fat should I choose?  Choose healthy fats more often. Choose monounsaturated and polyunsaturated fats, such as olive oil and canola oil, flaxseeds, walnuts, almonds, and seeds.  Eat more omega-3 fats. Good choices include salmon, mackerel, sardines, tuna, flaxseed oil, and ground flaxseeds. Aim to eat fish at least two times each week.  Limit saturated fats. Saturated fats are primarily found in animal products, such as meats, butter, and  cream. Plant sources of saturated fats include palm oil, palm kernel oil, and coconut oil.  Avoid foods with partially hydrogenated oils in them. These contain trans fats. Examples of foods that contain trans fats are stick margarine, some tub margarines, cookies, crackers, and other baked goods. What general guidelines do I need to follow?  Check food labels carefully to identify foods with trans fats or high amounts of saturated fat.  Fill one half of your plate with vegetables and green salads. Eat 4-5 servings of vegetables per day. A serving of vegetables equals 1 cup of raw leafy vegetables,  cup of raw or cooked cut-up vegetables, or  cup of vegetable juice.  Fill one fourth of your plate with whole grains. Look for the word "whole" as the first word in the ingredient list.  Fill one fourth of your plate with lean protein foods.  Eat 4-5 servings of fruit per day. A serving of fruit equals one medium whole fruit,  cup of dried fruit,  cup of fresh, frozen, or canned fruit, or  cup of 100% fruit juice.  Eat more foods that contain soluble fiber. Examples of foods that contain this type of fiber are apples, broccoli, carrots, beans, peas, and barley. Aim to get 20-30 g of fiber per day.  Eat more home-cooked food and less restaurant, buffet, and fast food.  Limit or avoid alcohol.  Limit foods that are high in starch and sugar.  Avoid fried foods.  Cook foods by using methods other than frying. Baking, boiling, grilling, and broiling are all great options. Other fat-reducing suggestions include: ?  Removing the skin from poultry. ? Removing all visible fats from meats. ? Skimming the fat off of stews, soups, and gravies before serving them. ? Steaming vegetables in water or broth.  Lose weight if you are overweight. Losing just 5-10% of your initial body weight can help your overall health and prevent diseases such as diabetes and heart disease.  Increase your consumption of  nuts, legumes, and seeds to 4-5 servings per week. One serving of dried beans or legumes equals  cup after being cooked, one serving of nuts equals 1 ounces, and one serving of seeds equals  ounce or 1 tablespoon.  You may need to monitor your salt (sodium) intake, especially if you have high blood pressure. Talk with your health care provider or dietitian to get more information about reducing sodium. What foods can I eat? Grains  Breads, including [place], white, pita, wheat, raisin, rye, oatmeal, and [place]. Tortillas that are neither fried nor made with lard or trans fat. Low-fat rolls, including hotdog and hamburger buns and English muffins. Biscuits. Muffins. Waffles. Pancakes. Light popcorn. Whole-grain cereals. Flatbread. Melba toast. Pretzels. Breadsticks. Rusks. Low-fat snacks and crackers, including oyster, saltine, matzo, graham, animal, and rye. Rice and pasta, including brown rice and those that are made with whole wheat. Vegetables All vegetables. Fruits All fruits, but limit coconut. Meats and Other Protein Sources Lean, well-trimmed beef, veal, pork, and lamb. Chicken and [place] without skin. All fish and shellfish. Wild duck, rabbit, pheasant, and venison. Egg whites or low-cholesterol egg substitutes. Dried beans, peas, lentils, and tofu.Seeds and most nuts. Dairy Low-fat or nonfat cheeses, including ricotta, string, and mozzarella. Skim or 1% milk that is liquid, powdered, or evaporated. Buttermilk that is made with low-fat milk. Nonfat or low-fat yogurt. Beverages Mineral water. Diet carbonated beverages. Sweets and Desserts Sherbets and fruit ices. Honey, jam, marmalade, jelly, and syrups. Meringues and gelatins. Pure sugar candy, such as hard candy, jelly beans, gumdrops, mints, marshmallows, and small amounts of dark chocolate. [employer] Eat all sweets and desserts in moderation. Fats and Oils Nonhydrogenated (trans-free) margarines. Vegetable oils, including  soybean, sesame, sunflower, olive, peanut, safflower, corn, canola, and cottonseed. Salad dressings or mayonnaise that are made with a vegetable oil. Limit added fats and oils that you use for cooking, baking, salads, and as spreads. Other Cocoa powder. Coffee and tea. All seasonings and condiments. The items listed above may not be a complete list of recommended foods or beverages. Contact your dietitian for more options. What foods are not recommended? Grains Breads that are made with saturated or trans fats, oils, or whole milk. Croissants. Butter rolls. Cheese breads. Sweet rolls. Donuts. Buttered popcorn. Chow mein noodles. High-fat crackers, such as cheese or butter crackers. Meats and Other Protein Sources Fatty meats, such as hotdogs, short ribs, sausage, spareribs, bacon, ribeye roast or steak, and mutton. High-fat deli meats, such as salami and bologna. Caviar. Domestic duck and goose. Organ meats, such as kidney, liver, sweetbreads, brains, gizzard, chitterlings, and heart. Dairy Cream, sour cream, cream cheese, and creamed cottage cheese. Whole milk cheeses, including blue (bleu), Monterey Jack, [place], [place], American, [place], Swiss, [place], [place], and [place]. Whole or 2% milk that is liquid, evaporated, or condensed. Whole buttermilk. Cream sauce or high-fat cheese sauce. Yogurt that is made from whole milk. Beverages Regular sodas and drinks with added sugar. Sweets and Desserts Frosting. Pudding. Cookies. Cakes other than angel food cake. Candy that has milk chocolate or white chocolate, hydrogenated fat, butter, coconut, or unknown  ingredients. Buttered syrups. Full-fat ice cream or ice cream drinks. Fats and Oils Gravy that has suet, meat fat, or shortening. Cocoa butter, hydrogenated oils, palm oil, coconut oil, palm kernel oil. These can often be found in baked products, candy, fried foods, nondairy creamers, and whipped toppings. Solid fats and shortenings, including bacon  fat, salt pork, lard, and butter. Nondairy cream substitutes, such as coffee creamers and sour cream substitutes. Salad dressings that are made of unknown oils, cheese, or sour cream. The items listed above may not be a complete list of foods and beverages to avoid. Contact your dietitian for more information. This information is not intended to replace advice given to you by your health care provider. Make sure you discuss any questions you have with your health care provider. Document Released: 08/18/2008 Document Revised: 05/29/2016 Document Reviewed: 05/03/2014 Elsevier Interactive Patient Education  2017 [employer].  Please schedule full physical in 6 months GREAT TO SEE YOU!!!

## 2017-11-05 ENCOUNTER — Telehealth: Payer: Self-pay | Admitting: Adult Health

## 2017-11-05 LAB — LIPID PANEL
CHOL/HDL RATIO: 3.5 ratio (ref 0.0–4.4)
Cholesterol, Total: 156 mg/dL (ref 100–199)
HDL: 44 mg/dL (ref >39)
LDL CALC: 94 mg/dL (ref 0–99)
Triglycerides: 92 mg/dL (ref 0–149)
VLDL CHOLESTEROL CAL: 18 mg/dL (ref 5–40)

## 2017-11-05 LAB — COMPREHENSIVE METABOLIC PANEL
ALK PHOS: 68 IU/L (ref 39–117)
ALT: 15 IU/L (ref 0–32)
AST: 20 IU/L (ref 0–40)
Albumin/Globulin Ratio: 1.5 (ref 1.2–2.2)
Albumin: 4.2 g/dL (ref 3.5–4.8)
BILIRUBIN TOTAL: 0.5 mg/dL (ref 0.0–1.2)
BUN/Creatinine Ratio: 20 (ref 12–28)
BUN: 15 mg/dL (ref 8–27)
CHLORIDE: 104 mmol/L (ref 96–106)
CO2: 29 mmol/L (ref 20–29)
Calcium: 9.7 mg/dL (ref 8.7–10.3)
Creatinine, Ser: 0.75 mg/dL (ref 0.57–1.00)
GFR calc Af Amer: 88 mL/min/{1.73_m2} (ref >59)
GFR calc non Af Amer: 76 mL/min/{1.73_m2} (ref >59)
GLUCOSE: 108 mg/dL — AB (ref 65–99)
Globulin, Total: 2.8 g/dL (ref 1.5–4.5)
Potassium: 4.6 mmol/L (ref 3.5–5.2)
Sodium: 144 mmol/L (ref 134–144)
Total Protein: 7 g/dL (ref 6.0–8.5)

## 2017-11-05 LAB — CBC WITH DIFFERENTIAL/PLATELET
BASOS ABS: 0 10*3/uL (ref 0.0–0.2)
Basos: 0 %
EOS (ABSOLUTE): 0.1 10*3/uL (ref 0.0–0.4)
Eos: 1 %
Hematocrit: 40 % (ref 34.0–46.6)
Hemoglobin: 13.1 g/dL (ref 11.1–15.9)
IMMATURE GRANS (ABS): 0 10*3/uL (ref 0.0–0.1)
Immature Granulocytes: 0 %
LYMPHS ABS: 1.4 10*3/uL (ref 0.7–3.1)
LYMPHS: 20 %
MCH: 28.7 pg (ref 26.6–33.0)
MCHC: 32.8 g/dL (ref 31.5–35.7)
MCV: 88 fL (ref 79–97)
Monocytes Absolute: 0.5 10*3/uL (ref 0.1–0.9)
Monocytes: 8 %
NEUTROS ABS: 4.9 10*3/uL (ref 1.4–7.0)
Neutrophils: 71 %
PLATELETS: 217 10*3/uL (ref 150–379)
RBC: 4.56 x10E6/uL (ref 3.77–5.28)
RDW: 14.4 % (ref 12.3–15.4)
WBC: 6.9 10*3/uL (ref 3.4–10.8)

## 2017-11-05 LAB — HEMOGLOBIN A1C
ESTIMATED AVERAGE GLUCOSE: 137 mg/dL
HEMOGLOBIN A1C: 6.4 % — AB (ref 4.8–5.6)

## 2017-11-05 LAB — TSH: TSH: 2.23 u[IU]/mL (ref 0.450–4.500)

## 2017-11-05 NOTE — Telephone Encounter (Signed)
Patient called states she was reading AVS and saw her Mammogram was scheduled @ [facility]-- Pt does NOT want to go there advised she told provider she wanted the [facility] @ [facility]-[phone])-forwarding message to [place] to reschedule .  -glh

## 2017-11-30 ENCOUNTER — Ambulatory Visit: Payer: Medicare Other | Admitting: Podiatry

## 2017-11-30 ENCOUNTER — Encounter: Payer: Self-pay | Admitting: Podiatry

## 2017-11-30 DIAGNOSIS — M722 Plantar fascial fibromatosis: Secondary | ICD-10-CM | POA: Diagnosis not present

## 2017-11-30 NOTE — Progress Notes (Signed)
She presents today for follow-up of plantar fasciitis states that it still seems to bother me a bit but is on the outside of the foot as she points to the right foot.  Objective: Vital signs are stable alert and oriented x3.  Pulses are palpable.  She has no pain on palpation medial calcaneal tubercle of the right heel though she does have tenderness on palpation of the lateral calcaneal tubercle of the right heel.  No open lesions or wounds are noted.  Assessment: Well-healing medial plantar fasciitis lateral plantar fasciitis prevalent today.  Plan: After sterile Betadine skin prep and verbal confirmation I injected 20 mg of Kenalog 5 mg Marcaine to the lateral aspect of the right heel.  Tolerated this well.  We discussed the alternative options such as orthotics and physical therapy should this fail to render her asymptomatic.  Follow-up with her in 1 month.

## 2017-12-13 ENCOUNTER — Other Ambulatory Visit: Payer: Self-pay | Admitting: Adult Health

## 2017-12-13 ENCOUNTER — Ambulatory Visit
Admission: RE | Admit: 2017-12-13 | Discharge: 2017-12-13 | Disposition: A | Discharge status: Home / Self Care | Payer: Medicare Other | Source: Ambulatory Visit | Attending: Adult Health | Admitting: Adult Health

## 2017-12-13 DIAGNOSIS — Z1231 Encounter for screening mammogram for malignant neoplasm of breast: Secondary | ICD-10-CM | POA: Insufficient documentation

## 2017-12-13 DIAGNOSIS — Z1239 Encounter for other screening for malignant neoplasm of breast: Secondary | ICD-10-CM

## 2017-12-13 MED ORDER — VALSARTAN 160 MG PO TABS: 160.0000 mg | ORAL_TABLET | Freq: Every day | ORAL | 0 refills | Status: DC

## 2017-12-13 MED ORDER — LORAZEPAM 1 MG PO TABS: 1.0000 mg | ORAL_TABLET | Freq: Every day | ORAL | 0 refills | Status: DC | PRN

## 2017-12-13 NOTE — Telephone Encounter (Signed)
We have not prescribed the valsartan for the patient previously.  Please review and refill if appropriate.  Charyl Bigger, CMA

## 2017-12-13 NOTE — Telephone Encounter (Signed)
Patient is requesting a refill of her lorazepam and valsartan sent to [facility]

## 2017-12-13 NOTE — Addendum Note (Signed)
Addended by: Fonnie Mu on: 12/13/2017 01:48 PM   Modules accepted: Orders

## 2017-12-21 ENCOUNTER — Other Ambulatory Visit: Payer: Self-pay | Admitting: *Deleted

## 2017-12-21 ENCOUNTER — Inpatient Hospital Stay
Admission: RE | Admit: 2017-12-21 | Discharge: 2017-12-21 | Disposition: A | Discharge status: Home / Self Care | Payer: Self-pay | Source: Ambulatory Visit | Attending: *Deleted | Admitting: *Deleted

## 2017-12-21 DIAGNOSIS — Z9289 Personal history of other medical treatment: Secondary | ICD-10-CM

## 2017-12-30 ENCOUNTER — Encounter: Payer: Self-pay | Admitting: Podiatry

## 2017-12-30 ENCOUNTER — Ambulatory Visit: Payer: Medicare Other | Admitting: Podiatry

## 2017-12-30 DIAGNOSIS — M722 Plantar fascial fibromatosis: Secondary | ICD-10-CM | POA: Diagnosis not present

## 2018-01-02 NOTE — Progress Notes (Signed)
She presents today for follow-up of her plantar fasciitis.  States that is 95% improved.  There is only 1 [address] right here that even hurts as she points to the medial calcaneal tubercle.  Assessment: Residual plantar fasciitis 95% improved.  Plan: After sterile Betadine skin prep injected dexamethasone to the point of maximal tenderness a total of 2 mg was injected with local anesthetic and a 30-gauge needle.  Tolerated procedure well follow-up with her as needed.

## 2018-02-23 ENCOUNTER — Telehealth: Payer: Self-pay

## 2018-02-23 NOTE — Telephone Encounter (Signed)
We received a telephone advice record from the [facility] that stated that the patient's daughter Daphane Shepherd) had called on 02/17/2018 @ 8:13 PM.  "Patient c/o vomiting and fever - 101. Patient was very tired but alert. Last urin 8 hours ago but feels like she can now. No trouble urinating. Vomiting start abt 1 AM and last time patient vomited was at 1 pm same day."  The fax does not list what was done for the patient and sates that triage will not be completed.  States that the patient is not a patient at the practice. Caller had stated that she was not a patient, more likely meaning her and not the patient.  I called the patient yesterday and left a message for her to call me back.  Patient called me and states that she is feeling much better and that she has been pushing water.  She states that everyone in the household has gotten the same thing, but all are improving.  Patient states that she did receive a call back from the nurse that night with instructions on what to do and red flag symptoms.  MPulliam, CMA/RT(R)

## 2018-03-13 ENCOUNTER — Other Ambulatory Visit: Payer: Self-pay | Admitting: Adult Health

## 2018-03-16 NOTE — Progress Notes (Signed)
Subjective:    Patient ID: Andrea Brooks, female    DOB: 04-29-1936, 83 y.o.   MRN: [id number]  HPI:  06/24/17 OV: Andrea Brooks is here for f/u:  HTN, T2D.  She has been taking Metformin 1000mg  BID, following a heart healthy diet, performing daily lifting/walking/moving boxes due to a move/home remodel since April 2018.  She has lost 10lbs since last OV and A1c today 6.4 (last A1c >8 in Spring 2018)-GREAT JOB!  She continues to take Vytorin10/20mg , Omeprazole 10mg , Diovan 160mg  daily. She has only required one dose of Lorazepam 1mg  Q HS in May.  She and her husband now live in 1800 sq foot "in law suite" attached to her daughter's home.  She  Reports am BS running 110-130s, denies episodes of hypoglycemia.  She denies EOTH/tobacco use. Two acute complaints today: 1)   She was hospitalized 2014 for pneumonia and strong/constant cough caused R thoracic pain that eventually resolved, since then it will re-emerge with strenuous exertion (ie. Lifting boxes/moving that last few months). A few weeks ago R thoracic "soreness" developed rated 8/10 she has been treating with heating pad and OTC Acetaminophen.  Since the move completed last month, the soreness resolved.  She just wanted to make mention in case is occurs again. 2) Loose stools accompanied with "tightness around my anus" that occurs 2-3 times a week in the morning.  She denies abdominal pain, or blood in urine/stool.  Andrea Brooks' last colonoscopy revealed a polyp and her repeat screening is due 2019.  Her mother had colon ca.  She had 10 lb wt loss since last OV, however she has been improving diet, Metformin dosage was increased in Spring, and she has been moving a lot more than usual with the move, home re-model.  03/17/18 OV: Andrea Brooks is her first regular f/u: HTN, T2D She reports medication compliance and denies SE. AM BS 120-130s, denies episodes of hypoglycemia She remains active with walking and yard/house work. Home renovation is about  "995 finished". She continues to use Lorazepam sparingly   Lab Results  Component Value Date   HGBA1C 5.9 03/17/2018   HGBA1C 6.4 (H) 11/04/2017   HGBA1C 6.4 06/24/2017   Patient Care Team    Relationship Specialty Notifications Start End  Esaw Grandchild, NP PCP - General Family Medicine  04/22/17     Patient Active Problem List   Diagnosis Date Noted  . Healthcare maintenance 03/17/2018  . Pain of right heel 07/06/2017  . Loose stools 06/24/2017  . Right-sided thoracic back pain 06/24/2017  . Hyperlipidemia 04/22/2017  . GERD (gastroesophageal reflux disease) 04/22/2017  . Impaired glucose metabolism 04/22/2017  . HTN (hypertension) 04/22/2017  . Anxiety 04/22/2017     Past Medical History:  Diagnosis Date  . Anxiety   . Colon polyps   . Diabetes mellitus without complication ([place])   . Diverticulosis   . Gallstones   . GERD (gastroesophageal reflux disease)   . Hyperlipidemia   . Hypertension   . Obesity      Past Surgical History:  Procedure Laterality Date  . ABDOMINAL HYSTERECTOMY     total  . BREAST BIOPSY Left    neg  . CHOLECYSTECTOMY    . JOINT REPLACEMENT Bilateral    knee  . REPLACEMENT TOTAL KNEE BILATERAL       Family History  Problem Relation Age of Onset  . Hypertension Mother   . Colon cancer Mother   . Stroke Father   . Heart failure  Father   . Stroke Sister   . Stroke Brother   . Graves' disease Daughter   . Cancer Paternal Aunt        breast  . Breast cancer Paternal Aunt   . Graves' disease Sister   . Bone cancer Sister   . Breast cancer Maternal Aunt   . Esophageal cancer Neg Hx   . Rectal cancer Neg Hx   . Liver cancer Neg Hx      Social History   Substance and Sexual Activity  Drug Use No     Social History   Substance and Sexual Activity  Alcohol Use No     Social History   Tobacco Use  Smoking Status Former Smoker  . Packs/day: 1.00  . Years: 2.00  . Pack years: 2.00  . Last attempt to quit:  11/24/1959  . Years since quitting: 58.3  Smokeless Tobacco Never Used     Outpatient Encounter Medications as of 03/17/2018  Medication Sig  . aspirin EC 81 MG tablet Take 81 mg by mouth daily.  Marland Kitchen dicyclomine (BENTYL) 10 MG capsule Take 1 tab by mouth 30 min before a meal  as needed for cramping and diarrhea.  . ezetimibe-simvastatin (VYTORIN) 10-20 MG tablet Take 1 tablet by mouth daily.  Marland Kitchen LORazepam (ATIVAN) 1 MG tablet Take 1 tablet (1 mg total) by mouth daily as needed for anxiety.  . metFORMIN (GLUCOPHAGE) 500 MG tablet Take 1 tablet (500 mg total) by mouth 2 (two) times daily with a meal.  . omeprazole (PRILOSEC) 10 MG capsule Take 2.5 mg by mouth daily.   . valsartan (DIOVAN) 160 MG tablet Take 1 tablet (160 mg total) by mouth daily. PATIENT MUST HAVE OFFICE VISIT PRIOR TO ANY FURTHER REFILLS  . [DISCONTINUED] losartan (COZAAR) 100 MG tablet Take 1 tablet (100 mg total) by mouth daily.  . [DISCONTINUED] valsartan (DIOVAN) 160 MG tablet Take 1 tablet (160 mg total) by mouth daily. PATIENT MUST HAVE OFFICE VISIT PRIOR TO ANY FURTHER REFILLS  . [DISCONTINUED] doxycycline (VIBRA-TABS) 100 MG tablet Take 1 tablet (100 mg total) by mouth 2 (two) times daily.   Facility-Administered Encounter Medications as of 03/17/2018  Medication  . 0.9 %  sodium chloride infusion    Allergies: Patient has no known allergies.  Body mass index is 35.27 kg/m.  Blood pressure (!) 159/76, pulse 76, height 5' 3.75" (1.619 m), weight 203 lb 14.4 oz (92.5 kg), SpO2 98 %.  Review of Systems  Constitutional: Positive for fatigue. Negative for activity change, appetite change, chills, diaphoresis, fever and unexpected weight change.  Eyes: Negative for visual disturbance.  Respiratory: Negative for cough, chest tightness, shortness of breath, wheezing and stridor.   Cardiovascular: Negative for chest pain, palpitations and leg swelling.  Gastrointestinal: Negative for abdominal distention, abdominal pain,  blood in stool, constipation, diarrhea, nausea and vomiting.  Endocrine: Negative for cold intolerance, heat intolerance, polydipsia, polyphagia and polyuria.  Genitourinary: Negative for difficulty urinating, flank pain and hematuria.  Musculoskeletal: Positive for arthralgias and myalgias. Negative for back pain, gait problem, joint swelling, neck pain and neck stiffness.  Skin: Negative for color change, pallor, rash and wound.  Allergic/Immunologic: Negative for immunocompromised state.  Neurological: Negative for dizziness, tremors, weakness and headaches.  Hematological: Does not bruise/bleed easily.  Psychiatric/Behavioral: Negative for agitation, dysphoric mood, self-injury, sleep disturbance and suicidal ideas. The patient is not nervous/anxious.        Objective:   Physical Exam  Constitutional: She is oriented to  person, place, and time. She appears well-developed and well-nourished. No distress.  HENT:  Head: Normocephalic and atraumatic.  Right Ear: External ear normal.  Left Ear: External ear normal.  Eyes: Pupils are equal, round, and reactive to light. Conjunctivae are normal.  Cardiovascular: Normal rate, regular rhythm, normal heart sounds and intact distal pulses.  No murmur heard. Pulmonary/Chest: Breath sounds normal. No respiratory distress. She has no wheezes. She has no rales. She exhibits no tenderness.  Neurological: She is alert and oriented to person, place, and time. Coordination normal.  Skin: Skin is warm and dry. No rash noted. She is not diaphoretic. No erythema. No pallor.  Psychiatric: She has a normal mood and affect. Her behavior is normal. Judgment and thought content normal.  Nursing note and vitals reviewed.     Assessment & Plan:   1. Impaired glucose metabolism   2. Hypertension, unspecified type   3. Healthcare maintenance     HTN (hypertension) BP slightly above goal Continue Valsartan 160 QD  Losartan 100mg  d/c'd- this was used to  replace Valsartan during recall and not meant to be used with Losartan. Explained to pt. If BP remains above goal at f/u, will consider increasing dosage- max dosage 320mg /day  Impaired glucose metabolism Lab Results  Component Value Date   HGBA1C 5.9 03/17/2018   HGBA1C 6.4 (H) 11/04/2017   HGBA1C 6.4 06/24/2017  AM BS 120-130s, denies episodes of hypoglycemia Microalbumin-normal Continue Metformin 500mg  BID   Healthcare maintenance Please continue all medications as directed. A1c looks great today- 5.9! Microalbumin- normal! If you need refill on Lorazepam please call your pharmacy and we will send rx. For insomnia, please try OTC Melatonin. Recommend Medicare Wellness visit in 3 months.    FOLLOW-UP:  Return in about 3 months (around 06/16/2018) for Medical Wellness.

## 2018-03-17 ENCOUNTER — Telehealth: Payer: Self-pay

## 2018-03-17 ENCOUNTER — Encounter: Payer: Self-pay | Admitting: Adult Health

## 2018-03-17 ENCOUNTER — Ambulatory Visit: Payer: Medicare Other | Admitting: Adult Health

## 2018-03-17 VITALS — BP 159/76 | HR 76 | Ht 63.75 in | Wt 203.9 lb

## 2018-03-17 DIAGNOSIS — I10 Essential (primary) hypertension: Secondary | ICD-10-CM

## 2018-03-17 DIAGNOSIS — Z0000 Encounter for general adult medical examination without abnormal findings: Secondary | ICD-10-CM

## 2018-03-17 DIAGNOSIS — R7309 Other abnormal glucose: Secondary | ICD-10-CM | POA: Diagnosis not present

## 2018-03-17 LAB — POCT UA - MICROALBUMIN
CREATININE, POC: 200 mg/dL
Microalbumin Ur, POC: 30 mg/L

## 2018-03-17 LAB — POCT GLYCOSYLATED HEMOGLOBIN (HGB A1C): Hemoglobin A1C: 5.9

## 2018-03-17 MED ORDER — DOXYCYCLINE HYCLATE 100 MG PO TABS: 100.0000 mg | ORAL_TABLET | Freq: Two times a day (BID) | ORAL | 0 refills | Status: DC

## 2018-03-17 MED ORDER — VALSARTAN 160 MG PO TABS: 160.0000 mg | ORAL_TABLET | Freq: Every day | ORAL | 2 refills | Status: DC

## 2018-03-17 NOTE — Patient Instructions (Signed)
Mediterranean Diet A Mediterranean diet refers to food and lifestyle choices that are based on the traditions of countries located on the [place]. This way of eating has been shown to help prevent certain conditions and improve outcomes for people who have chronic diseases, like kidney disease and heart disease. What are tips for following this plan? Lifestyle  Cook and eat meals together with your family, when possible.  Drink enough fluid to keep your urine clear or pale yellow.  Be physically active every day. This includes: ? Aerobic exercise like running or swimming. ? Leisure activities like gardening, walking, or housework.  Get 7-8 hours of sleep each night.  If recommended by your health care provider, drink red wine in moderation. This means 1 glass a day for nonpregnant women and 2 glasses a day for men. A glass of wine equals 5 oz (150 mL). Reading food labels  Check the serving size of packaged foods. For foods such as rice and pasta, the serving size refers to the amount of cooked product, not dry.  Check the total fat in packaged foods. Avoid foods that have saturated fat or trans fats.  Check the ingredients list for added sugars, such as corn syrup. Shopping  At the grocery store, buy most of your food from the areas near the walls of the store. This includes: ? Fresh fruits and vegetables (produce). ? Grains, beans, nuts, and seeds. Some of these may be available in unpackaged forms or large amounts (in bulk). ? Fresh seafood. ? Poultry and eggs. ? Low-fat dairy products.  Buy whole ingredients instead of prepackaged foods.  Buy fresh fruits and vegetables in-season from local farmers markets.  Buy frozen fruits and vegetables in resealable bags.  If you do not have access to quality fresh seafood, buy precooked frozen shrimp or canned fish, such as tuna, salmon, or sardines.  Buy small amounts of raw or cooked vegetables, salads, or olives from the  deli or salad bar at your store.  Stock your pantry so you always have certain foods on hand, such as olive oil, canned tuna, canned tomatoes, rice, pasta, and beans. Cooking  Cook foods with extra-virgin olive oil instead of using butter or other vegetable oils.  Have meat as a side dish, and have vegetables or grains as your main dish. This means having meat in small portions or adding small amounts of meat to foods like pasta or stew.  Use beans or vegetables instead of meat in common dishes like chili or lasagna.  Experiment with different cooking methods. Try roasting or broiling vegetables instead of steaming or sauteing them.  Add frozen vegetables to soups, stews, pasta, or rice.  Add nuts or seeds for added healthy fat at each meal. You can add these to yogurt, salads, or vegetable dishes.  Marinate fish or vegetables using olive oil, lemon juice, garlic, and fresh herbs. Meal planning  Plan to eat 1 vegetarian meal one day each week. Try to work up to 2 vegetarian meals, if possible.  Eat seafood 2 or more times a week.  Have healthy snacks readily available, such as: ? Vegetable sticks with hummus. ? Greek yogurt. ? Fruit and nut trail mix.  Eat balanced meals throughout the week. This includes: ? Fruit: 2-3 servings a day ? Vegetables: 4-5 servings a day ? Low-fat dairy: 2 servings a day ? Fish, poultry, or lean meat: 1 serving a day ? Beans and legumes: 2 or more servings a week ? Nuts   and seeds: 1-2 servings a day ? Whole grains: 6-8 servings a day ? Extra-virgin olive oil: 3-4 servings a day  Limit red meat and sweets to only a few servings a month What are my food choices?  Mediterranean diet ? Recommended ? Grains: Whole-grain pasta. Brown rice. Bulgar wheat. Polenta. Couscous. Whole-wheat bread. [place]. ? Vegetables: Artichokes. Beets. Broccoli. Cabbage. Carrots. Eggplant. Green beans. Chard. Kale. Spinach. Onions. Leeks. Peas. Squash.  Tomatoes. Peppers. Radishes. ? Fruits: Apples. Apricots. Avocado. Berries. Bananas. Cherries. Dates. Figs. Grapes. Lemons. Melon. Oranges. Peaches. Plums. Pomegranate. ? Meats and other protein foods: Beans. Almonds. Sunflower seeds. Pine nuts. Peanuts. [place]. Salmon. Scallops. Shrimp. [place]. Tilapia. Clams. Oysters. Eggs. ? Dairy: Low-fat milk. Cheese. Greek yogurt. ? Beverages: Water. Red wine. Herbal tea. ? Fats and oils: Extra virgin olive oil. Avocado oil. Grape seed oil. ? Sweets and desserts: [place] yogurt with honey. Baked apples. Poached pears. Trail mix. ? Seasoning and other foods: Basil. Cilantro. Coriander. Cumin. Mint. Parsley. Sage. Rosemary. Tarragon. Garlic. Oregano. Thyme. Pepper. Balsalmic vinegar. Tahini. Hummus. Tomato sauce. Olives. Mushrooms. ? Limit these ? Grains: Prepackaged pasta or rice dishes. Prepackaged cereal with added sugar. ? Vegetables: Deep fried potatoes (french fries). ? Fruits: Fruit canned in syrup. ? Meats and other protein foods: Beef. Pork. Lamb. Poultry with skin. Hot dogs. [place] Salines. ? Dairy: Ice cream. Sour cream. Whole milk. ? Beverages: Juice. Sugar-sweetened soft drinks. Beer. Liquor and spirits. ? Fats and oils: Butter. Canola oil. Vegetable oil. Beef fat (tallow). Lard. ? Sweets and desserts: Cookies. Cakes. Pies. Candy. ? Seasoning and other foods: Mayonnaise. Premade sauces and marinades. ? The items listed may not be a complete list. Talk with your dietitian about what dietary choices are right for you. Summary  The Mediterranean diet includes both food and lifestyle choices.  Eat a variety of fresh fruits and vegetables, beans, nuts, seeds, and whole grains.  Limit the amount of red meat and sweets that you eat.  Talk with your health care provider about whether it is safe for you to drink red wine in moderation. This means 1 glass a day for nonpregnant women and 2 glasses a day for men. A glass of wine equals 5 oz (150 mL). This information  is not intended to replace advice given to you by your health care provider. Make sure you discuss any questions you have with your health care provider. Document Released: 07/02/2016 Document Revised: 08/04/2016 Document Reviewed: 07/02/2016 Elsevier Interactive Patient Education  2018 [employer].   Hypertension Hypertension, commonly called high blood pressure, is when the force of blood pumping through the arteries is too strong. The arteries are the blood vessels that carry blood from the heart throughout the body. Hypertension forces the heart to work harder to pump blood and may cause arteries to become narrow or stiff. Having untreated or uncontrolled hypertension can cause heart attacks, strokes, kidney disease, and other problems. A blood pressure reading consists of a higher number over a lower number. Ideally, your blood pressure should be below 120/80. The first ("top") number is called the systolic pressure. It is a measure of the pressure in your arteries as your heart beats. The second ("bottom") number is called the diastolic pressure. It is a measure of the pressure in your arteries as the heart relaxes. What are the causes? The cause of this condition is not known. What increases the risk? Some risk factors for high blood pressure are under your control. Others are not. Factors you can  change  Smoking.  Having type 2 diabetes mellitus, high cholesterol, or both.  Not getting enough exercise or physical activity.  Being overweight.  Having too much fat, sugar, calories, or salt (sodium) in your diet.  Drinking too much alcohol. Factors that are difficult or impossible to change  Having chronic kidney disease.  Having a family history of high blood pressure.  Age. Risk increases with age.  Race. You may be at higher risk if you are African-American.  Gender. Men are at higher risk than women before age 70. After age 39, women are at higher risk than  men.  Having obstructive sleep apnea.  Stress. What are the signs or symptoms? Extremely high blood pressure (hypertensive crisis) may cause:  Headache.  Anxiety.  Shortness of breath.  Nosebleed.  Nausea and vomiting.  Severe chest pain.  Jerky movements you cannot control (seizures).  How is this diagnosed? This condition is diagnosed by measuring your blood pressure while you are seated, with your arm resting on a surface. The cuff of the blood pressure monitor will be placed directly against the skin of your upper arm at the level of your heart. It should be measured at least twice using the same arm. Certain conditions can cause a difference in blood pressure between your right and left arms. Certain factors can cause blood pressure readings to be lower or higher than normal (elevated) for a short period of time:  When your blood pressure is higher when you are in a health care provider's office than when you are at home, this is called white coat hypertension. Most people with this condition do not need medicines.  When your blood pressure is higher at home than when you are in a health care provider's office, this is called masked hypertension. Most people with this condition may need medicines to control blood pressure.  If you have a high blood pressure reading during one visit or you have normal blood pressure with other risk factors:  You may be asked to return on a different day to have your blood pressure checked again.  You may be asked to monitor your blood pressure at home for 1 week or longer.  If you are diagnosed with hypertension, you may have other blood or imaging tests to help your health care provider understand your overall risk for other conditions. How is this treated? This condition is treated by making healthy lifestyle changes, such as eating healthy foods, exercising more, and reducing your alcohol intake. Your health care provider may prescribe  medicine if lifestyle changes are not enough to get your blood pressure under control, and if:  Your systolic blood pressure is above 130.  Your diastolic blood pressure is above 80.  Your personal target blood pressure may vary depending on your medical conditions, your age, and other factors. Follow these instructions at home: Eating and drinking  Eat a diet that is high in fiber and potassium, and low in sodium, added sugar, and fat. An example eating plan is called the DASH (Dietary Approaches to Stop Hypertension) diet. To eat this way: ? Eat plenty of fresh fruits and vegetables. Try to fill half of your plate at each meal with fruits and vegetables. ? Eat whole grains, such as whole wheat pasta, brown rice, or whole grain bread. Fill about one quarter of your plate with whole grains. ? Eat or drink low-fat dairy products, such as skim milk or low-fat yogurt. ? Avoid fatty cuts of meat, processed  or cured meats, and poultry with skin. Fill about one quarter of your plate with lean proteins, such as fish, chicken without skin, beans, eggs, and tofu. ? Avoid premade and processed foods. These tend to be higher in sodium, added sugar, and fat.  Reduce your daily sodium intake. Most people with hypertension should eat less than 1,500 mg of sodium a day.  Limit alcohol intake to no more than 1 drink a day for nonpregnant women and 2 drinks a day for men. One drink equals 12 oz of beer, 5 oz of wine, or 1 oz of hard liquor. Lifestyle  Work with your health care provider to maintain a healthy body weight or to lose weight. Ask what an ideal weight is for you.  Get at least 30 minutes of exercise that causes your heart to beat faster (aerobic exercise) most days of the week. Activities may include walking, swimming, or biking.  Include exercise to strengthen your muscles (resistance exercise), such as pilates or lifting weights, as part of your weekly exercise routine. Try to do these types  of exercises for 30 minutes at least 3 days a week.  Do not use any products that contain nicotine or tobacco, such as cigarettes and e-cigarettes. If you need help quitting, ask your health care provider.  Monitor your blood pressure at home as told by your health care provider.  Keep all follow-up visits as told by your health care provider. This is important. Medicines  Take over-the-counter and prescription medicines only as told by your health care provider. Follow directions carefully. Blood pressure medicines must be taken as prescribed.  Do not skip doses of blood pressure medicine. Doing this puts you at risk for problems and can make the medicine less effective.  Ask your health care provider about side effects or reactions to medicines that you should watch for. Contact a health care provider if:  You think you are having a reaction to a medicine you are taking.  You have headaches that keep coming back (recurring).  You feel dizzy.  You have swelling in your ankles.  You have trouble with your vision. Get help right away if:  You develop a severe headache or confusion.  You have unusual weakness or numbness.  You feel faint.  You have severe pain in your chest or abdomen.  You vomit repeatedly.  You have trouble breathing. Summary  Hypertension is when the force of blood pumping through your arteries is too strong. If this condition is not controlled, it may put you at risk for serious complications.  Your personal target blood pressure may vary depending on your medical conditions, your age, and other factors. For most people, a normal blood pressure is less than 120/80.  Hypertension is treated with lifestyle changes, medicines, or a combination of both. Lifestyle changes include weight loss, eating a healthy, low-sodium diet, exercising more, and limiting alcohol. This information is not intended to replace advice given to you by your health care provider.  Make sure you discuss any questions you have with your health care provider. Document Released: 11/09/2005 Document Revised: 10/07/2016 Document Reviewed: 10/07/2016 Elsevier Interactive Patient Education  2018 [employer].  Hypertension Hypertension, commonly called high blood pressure, is when the force of blood pumping through the arteries is too strong. The arteries are the blood vessels that carry blood from the heart throughout the body. Hypertension forces the heart to work harder to pump blood and may cause arteries to become narrow or stiff.  Having untreated or uncontrolled hypertension can cause heart attacks, strokes, kidney disease, and other problems. A blood pressure reading consists of a higher number over a lower number. Ideally, your blood pressure should be below 120/80. The first ("top") number is called the systolic pressure. It is a measure of the pressure in your arteries as your heart beats. The second ("bottom") number is called the diastolic pressure. It is a measure of the pressure in your arteries as the heart relaxes. What are the causes? The cause of this condition is not known. What increases the risk? Some risk factors for high blood pressure are under your control. Others are not. Factors you can change  Smoking.  Having type 2 diabetes mellitus, high cholesterol, or both.  Not getting enough exercise or physical activity.  Being overweight.  Having too much fat, sugar, calories, or salt (sodium) in your diet.  Drinking too much alcohol. Factors that are difficult or impossible to change  Having chronic kidney disease.  Having a family history of high blood pressure.  Age. Risk increases with age.  Race. You may be at higher risk if you are African-American.  Gender. Men are at higher risk than women before age 61. After age 29, women are at higher risk than men.  Having obstructive sleep apnea.  Stress. What are the signs or  symptoms? Extremely high blood pressure (hypertensive crisis) may cause:  Headache.  Anxiety.  Shortness of breath.  Nosebleed.  Nausea and vomiting.  Severe chest pain.  Jerky movements you cannot control (seizures).  How is this diagnosed? This condition is diagnosed by measuring your blood pressure while you are seated, with your arm resting on a surface. The cuff of the blood pressure monitor will be placed directly against the skin of your upper arm at the level of your heart. It should be measured at least twice using the same arm. Certain conditions can cause a difference in blood pressure between your right and left arms. Certain factors can cause blood pressure readings to be lower or higher than normal (elevated) for a short period of time:  When your blood pressure is higher when you are in a health care provider's office than when you are at home, this is called white coat hypertension. Most people with this condition do not need medicines.  When your blood pressure is higher at home than when you are in a health care provider's office, this is called masked hypertension. Most people with this condition may need medicines to control blood pressure.  If you have a high blood pressure reading during one visit or you have normal blood pressure with other risk factors:  You may be asked to return on a different day to have your blood pressure checked again.  You may be asked to monitor your blood pressure at home for 1 week or longer.  If you are diagnosed with hypertension, you may have other blood or imaging tests to help your health care provider understand your overall risk for other conditions. How is this treated? This condition is treated by making healthy lifestyle changes, such as eating healthy foods, exercising more, and reducing your alcohol intake. Your health care provider may prescribe medicine if lifestyle changes are not enough to get your blood pressure  under control, and if:  Your systolic blood pressure is above 130.  Your diastolic blood pressure is above 80.  Your personal target blood pressure may vary depending on your medical conditions, your age, and  other factors. Follow these instructions at home: Eating and drinking  Eat a diet that is high in fiber and potassium, and low in sodium, added sugar, and fat. An example eating plan is called the DASH (Dietary Approaches to Stop Hypertension) diet. To eat this way: ? Eat plenty of fresh fruits and vegetables. Try to fill half of your plate at each meal with fruits and vegetables. ? Eat whole grains, such as whole wheat pasta, brown rice, or whole grain bread. Fill about one quarter of your plate with whole grains. ? Eat or drink low-fat dairy products, such as skim milk or low-fat yogurt. ? Avoid fatty cuts of meat, processed or cured meats, and poultry with skin. Fill about one quarter of your plate with lean proteins, such as fish, chicken without skin, beans, eggs, and tofu. ? Avoid premade and processed foods. These tend to be higher in sodium, added sugar, and fat.  Reduce your daily sodium intake. Most people with hypertension should eat less than 1,500 mg of sodium a day.  Limit alcohol intake to no more than 1 drink a day for nonpregnant women and 2 drinks a day for men. One drink equals 12 oz of beer, 5 oz of wine, or 1 oz of hard liquor. Lifestyle  Work with your health care provider to maintain a healthy body weight or to lose weight. Ask what an ideal weight is for you.  Get at least 30 minutes of exercise that causes your heart to beat faster (aerobic exercise) most days of the week. Activities may include walking, swimming, or biking.  Include exercise to strengthen your muscles (resistance exercise), such as pilates or lifting weights, as part of your weekly exercise routine. Try to do these types of exercises for 30 minutes at least 3 days a week.  Do not use any  products that contain nicotine or tobacco, such as cigarettes and e-cigarettes. If you need help quitting, ask your health care provider.  Monitor your blood pressure at home as told by your health care provider.  Keep all follow-up visits as told by your health care provider. This is important. Medicines  Take over-the-counter and prescription medicines only as told by your health care provider. Follow directions carefully. Blood pressure medicines must be taken as prescribed.  Do not skip doses of blood pressure medicine. Doing this puts you at risk for problems and can make the medicine less effective.  Ask your health care provider about side effects or reactions to medicines that you should watch for. Contact a health care provider if:  You think you are having a reaction to a medicine you are taking.  You have headaches that keep coming back (recurring).  You feel dizzy.  You have swelling in your ankles.  You have trouble with your vision. Get help right away if:  You develop a severe headache or confusion.  You have unusual weakness or numbness.  You feel faint.  You have severe pain in your chest or abdomen.  You vomit repeatedly.  You have trouble breathing. Summary  Hypertension is when the force of blood pumping through your arteries is too strong. If this condition is not controlled, it may put you at risk for serious complications.  Your personal target blood pressure may vary depending on your medical conditions, your age, and other factors. For most people, a normal blood pressure is less than 120/80.  Hypertension is treated with lifestyle changes, medicines, or a combination of both. Lifestyle changes  include weight loss, eating a healthy, low-sodium diet, exercising more, and limiting alcohol. This information is not intended to replace advice given to you by your health care provider. Make sure you discuss any questions you have with your health care  provider. Document Released: 11/09/2005 Document Revised: 10/07/2016 Document Reviewed: 10/07/2016 Elsevier Interactive Patient Education  2018 [employer]. Insomnia Insomnia is a sleep disorder that makes it difficult to fall asleep or to stay asleep. Insomnia can cause tiredness (fatigue), low energy, difficulty concentrating, mood swings, and poor performance at work or school. There are three different ways to classify insomnia:  Difficulty falling asleep.  Difficulty staying asleep.  Waking up too early in the morning.  Any type of insomnia can be long-term (chronic) or short-term (acute). Both are common. Short-term insomnia usually lasts for three months or less. Chronic insomnia occurs at least three times a week for longer than three months. What are the causes? Insomnia may be caused by another condition, situation, or substance, such as:  Anxiety.  Certain medicines.  Gastroesophageal reflux disease (GERD) or other gastrointestinal conditions.  Asthma or other breathing conditions.  Restless legs syndrome, sleep apnea, or other sleep disorders.  Chronic pain.  Menopause. This may include hot flashes.  Stroke.  Abuse of alcohol, tobacco, or illegal drugs.  Depression.  Caffeine.  Neurological disorders, such as Alzheimer disease.  An overactive thyroid (hyperthyroidism).  The cause of insomnia may not be known. What increases the risk? Risk factors for insomnia include:  Gender. Women are more commonly affected than men.  Age. Insomnia is more common as you get older.  Stress. This may involve your professional or personal life.  Income. Insomnia is more common in people with lower income.  Lack of exercise.  Irregular work schedule or night shifts.  Traveling between different time zones.  What are the signs or symptoms? If you have insomnia, trouble falling asleep or trouble staying asleep is the main symptom. This may lead to other  symptoms, such as:  Feeling fatigued.  Feeling nervous about going to sleep.  Not feeling rested in the morning.  Having trouble concentrating.  Feeling irritable, anxious, or depressed.  How is this treated? Treatment for insomnia depends on the cause. If your insomnia is caused by an underlying condition, treatment will focus on addressing the condition. Treatment may also include:  Medicines to help you sleep.  Counseling or therapy.  Lifestyle adjustments.  Follow these instructions at home:  Take medicines only as directed by your health care provider.  Keep regular sleeping and waking hours. Avoid naps.  Keep a sleep diary to help you and your health care provider figure out what could be causing your insomnia. Include: ? When you sleep. ? When you wake up during the night. ? How well you sleep. ? How rested you feel the next day. ? Any side effects of medicines you are taking. ? What you eat and drink.  Make your bedroom a comfortable place where it is easy to fall asleep: ? Put up shades or special blackout curtains to block light from outside. ? Use a white noise machine to block noise. ? Keep the temperature cool.  Exercise regularly as directed by your health care provider. Avoid exercising right before bedtime.  Use relaxation techniques to manage stress. Ask your health care provider to suggest some techniques that may work well for you. These may include: ? Breathing exercises. ? Routines to release muscle tension. ? Visualizing peaceful scenes.  Cut back on alcohol, caffeinated beverages, and cigarettes, especially close to bedtime. These can disrupt your sleep.  Do not overeat or eat spicy foods right before bedtime. This can lead to digestive discomfort that can make it hard for you to sleep.  Limit screen use before bedtime. This includes: ? Watching TV. ? Using your smartphone, tablet, and computer.  Stick to a routine. This can help you fall  asleep faster. Try to do a quiet activity, brush your teeth, and go to bed at the same time each night.  Get out of bed if you are still awake after 15 minutes of trying to sleep. Keep the lights down, but try reading or doing a quiet activity. When you feel sleepy, go back to bed.  Make sure that you drive carefully. Avoid driving if you feel very sleepy.  Keep all follow-up appointments as directed by your health care provider. This is important. Contact a health care provider if:  You are tired throughout the day or have trouble in your daily routine due to sleepiness.  You continue to have sleep problems or your sleep problems get worse. Get help right away if:  You have serious thoughts about hurting yourself or someone else. This information is not intended to replace advice given to you by your health care provider. Make sure you discuss any questions you have with your health care provider. Document Released: 11/06/2000 Document Revised: 04/10/2016 Document Reviewed: 08/10/2014 Elsevier Interactive Patient Education  [employer].   Please continue all medications as directed. A1c looks great today- 5.9! Microalbumin- normal! If you need refill on Lorazepam please call your pharmacy and we will send rx. For insomnia, please try OTC Melatonin. Recommend Medicare Wellness visit in 3 months. YOU ARE DOING GREAT! NICE TO SEE YOU!

## 2018-03-17 NOTE — Telephone Encounter (Signed)
Pt is to only take valsartan and d/c losartan per Mina Marble, NP.  LVM for pt to return call to discuss.  Charyl Bigger, CMA

## 2018-03-17 NOTE — Telephone Encounter (Signed)
Left 2nd VM requesting pt call to discuss ASAP.  Charyl Bigger, CMA

## 2018-03-17 NOTE — Assessment & Plan Note (Addendum)
BP slightly above goal Continue Valsartan 160 QD  Losartan 100mg  d/c'd- this was used to replace Valsartan during recall and not meant to be used with Losartan. Explained to pt. If BP remains above goal at f/u, will consider increasing dosage- max dosage 320mg /day

## 2018-03-17 NOTE — Telephone Encounter (Signed)
Pt informed to DC losartan, check BPs daily and if consistently >140/90 she is to call our office.  Pt expressed understanding and is agreeable.  Charyl Bigger, CMA

## 2018-03-17 NOTE — Assessment & Plan Note (Signed)
Please continue all medications as directed. A1c looks great today- 5.9! Microalbumin- normal! If you need refill on Lorazepam please call your pharmacy and we will send rx. For insomnia, please try OTC Melatonin. Recommend Medicare Wellness visit in 3 months.

## 2018-03-17 NOTE — Assessment & Plan Note (Addendum)
Lab Results  Component Value Date   HGBA1C 5.9 03/17/2018   HGBA1C 6.4 (H) 11/04/2017   HGBA1C 6.4 06/24/2017  AM BS 120-130s, denies episodes of hypoglycemia Microalbumin-normal Continue Metformin 500mg  BID

## 2018-03-24 ENCOUNTER — Telehealth: Payer: Self-pay | Admitting: Adult Health

## 2018-03-24 NOTE — Telephone Encounter (Signed)
Please advise.  T. Amica Harron, CMA 

## 2018-03-24 NOTE — Telephone Encounter (Signed)
Patient walked in and said she has been having bad episodes of diverticulitis the past couple days. Talked with [name] who recommends f/u with GI and patient is agreeable (need referral in Epic please). But in the short term she would like to speak with someone clinical about things she can do in the short term to help. Please advise

## 2018-03-24 NOTE — Telephone Encounter (Signed)
Pt stated that she is feeling better and that she has been eating clear liquid diet. She stated that she had CT 8-10 years ago but she hasn't had a flare up until recently. She stated that she will try OTC medication. W.Lakeesha Fontanilla, CCMA.

## 2018-03-24 NOTE — Telephone Encounter (Signed)
Good Afternoon Whitney, Can you please cal Ms. Hestand and tell her that treatment of diverticulitis includes clear liquid diet (to rest the bowel) and OTC Acetaminophen per manufacturer's instructions. Can you please ask if she has ever had CT of abdomen to confirm dx of diverticulitis? She will need to be evaluated for further treatment. Thanks! [name]

## 2018-03-30 ENCOUNTER — Encounter: Payer: Self-pay | Admitting: Family Medicine

## 2018-03-30 ENCOUNTER — Ambulatory Visit: Payer: Medicare Other | Admitting: Family Medicine

## 2018-03-30 VITALS — BP 131/86 | HR 87 | Ht 63.75 in | Wt 195.2 lb

## 2018-03-30 DIAGNOSIS — K582 Mixed irritable bowel syndrome: Secondary | ICD-10-CM | POA: Diagnosis not present

## 2018-03-30 DIAGNOSIS — K580 Irritable bowel syndrome with diarrhea: Secondary | ICD-10-CM | POA: Insufficient documentation

## 2018-03-30 DIAGNOSIS — K5792 Diverticulitis of intestine, part unspecified, without perforation or abscess without bleeding: Secondary | ICD-10-CM | POA: Diagnosis not present

## 2018-03-30 NOTE — Patient Instructions (Signed)
For the next 48 hours I want you to follow a clear liquid diet.     Then, if you remain completely asymptomatic without any abdominal pain or discomfort, for the next 5 days I want you to have a softened, bland diet.   Please see the handouts that I provided to you for details on what this means. -After 7 days following the above diet, then you can slowly progress to your normal diet.    Diverticulitis Diverticulitis is infection or inflammation of small pouches (diverticula) in the colon that form due to a condition called diverticulosis. Diverticula can trap stool (feces) and bacteria, causing infection and inflammation. Diverticulitis may cause severe stomach pain and diarrhea. It may lead to tissue damage in the colon that causes bleeding. The diverticula may also burst (rupture) and cause infected stool to enter other areas of the abdomen. Complications of diverticulitis can include:  Bleeding.  Severe infection.  Severe pain.  Rupture (perforation) of the colon.  Blockage (obstruction) of the colon.  What are the causes? This condition is caused by stool becoming trapped in the diverticula, which allows bacteria to grow in the diverticula. This leads to inflammation and infection. What increases the risk? You are more likely to develop this condition if:  You have diverticulosis. The risk for diverticulosis increases if: ? You are overweight or obese. ? You use tobacco products. ? You do not get enough exercise.  You eat a diet that does not include enough fiber. High-fiber foods include fruits, vegetables, beans, nuts, and whole grains.  What are the signs or symptoms? Symptoms of this condition may include:  Pain and tenderness in the abdomen. The pain is normally located on the left side of the abdomen, but it may occur in other areas.  Fever and chills.  Bloating.  Cramping.  Nausea.  Vomiting.  Changes in bowel routines.  Blood in your stool.  How is this  diagnosed? This condition is diagnosed based on:  Your medical history.  A physical exam.  Tests to make sure there is nothing else causing your condition. These tests may include: ? Blood tests. ? Urine tests. ? Imaging tests of the abdomen, including X-rays, ultrasounds, MRIs, or CT scans.  How is this treated? Most cases of this condition are mild and can be treated at home. Treatment may include:  Taking over-the-counter pain medicines.  Following a clear liquid diet.  Taking antibiotic medicines by mouth.  Rest.  More severe cases may need to be treated at a hospital. Treatment may include:  Not eating or drinking.  Taking prescription pain medicine.  Receiving antibiotic medicines through an IV tube.  Receiving fluids and nutrition through an IV tube.  Surgery.  When your condition is under control, your health care provider may recommend that you have a colonoscopy. This is an exam to look at the entire large intestine. During the exam, a lubricated, bendable tube is inserted into the anus and then passed into the rectum, colon, and other parts of the large intestine. A colonoscopy can show how severe your diverticula are and whether something else may be causing your symptoms. Follow these instructions at home: Medicines  Take over-the-counter and prescription medicines only as told by your health care provider. These include fiber supplements, probiotics, and stool softeners.  If you were prescribed an antibiotic medicine, take it as told by your health care provider. Do not stop taking the antibiotic even if you start to feel better.  Do not  drive or use heavy machinery while taking prescription pain medicine. General instructions  Follow a full liquid diet or another diet as directed by your health care provider. After your symptoms improve, your health care provider may tell you to change your diet. He or she may recommend that you eat a diet that contains at  least 25 g (25 grams) of fiber daily. Fiber makes it easier to pass stool. Healthy sources of fiber include: ? Berries. One cup contains 4-8 grams of fiber. ? Beans or lentils. One half cup contains 5-8 grams of fiber. ? Green vegetables. One cup contains 4 grams of fiber.  Exercise for at least 30 minutes, 3 times each week. You should exercise hard enough to raise your heart rate and break a sweat.  Keep all follow-up visits as told by your health care provider. This is important. You may need a colonoscopy. Contact a health care provider if:  Your pain does not improve.  You have a hard time drinking or eating food.  Your bowel movements do not return to normal. Get help right away if:  Your pain gets worse.  Your symptoms do not get better with treatment.  Your symptoms suddenly get worse.  You have a fever.  You vomit more than one time.  You have stools that are bloody, black, or tarry. Summary  Diverticulitis is infection or inflammation of small pouches (diverticula) in the colon that form due to a condition called diverticulosis. Diverticula can trap stool (feces) and bacteria, causing infection and inflammation.  You are at higher risk for this condition if you have diverticulosis and you eat a diet that does not include enough fiber.  Most cases of this condition are mild and can be treated at home. More severe cases may need to be treated at a hospital.  When your condition is under control, your health care provider may recommend that you have an exam called a colonoscopy. This exam can show how severe your diverticula are and whether something else may be causing your symptoms. This information is not intended to replace advice given to you by your health care provider. Make sure you discuss any questions you have with your health care provider. Document Released: 08/19/2005 Document Revised: 12/12/2016 Document Reviewed: 12/12/2016 Elsevier Interactive Patient  Education  [employer].

## 2018-03-30 NOTE — Progress Notes (Signed)
Pt here for an acute care OV today   Impression and Recommendations:    1. Diverticulitis-  mild symptoms   2. Irritable bowel syndrome with both constipation and diarrhea-has chronic symptoms treated by GI w meds     1. Diverticulitis- mild/ IBS with constipation and diarrhea -Do not eat for the next 48 hours- only clear liquids. Progress from clear liquid to soft diet, slowly over time. See AVS  -Pt understands if she develops new symptoms like fevers, severe abdominal pain etc. - she will go to the emergency department.  She looks very comfortable and stable today.  -call your GI doctor and follow up with them for your chronic diarrhea mixed with constipation due to IBS.   -MULTIPLE Handouts and information provided.    Education and routine counseling performed. Handouts provided  Gross side effects, risk and benefits, and alternatives of medications and treatment plan in general discussed with patient.  Patient is aware that all medications have potential side effects and we are unable to predict every side effect or drug-drug interaction that may occur.   Patient will call with any questions prior to using medication if they have concerns.  Expresses verbal understanding and consents to current therapy and treatment regimen.  No barriers to understanding were identified.  Red flag symptoms and signs discussed in detail.  Patient expressed understanding regarding what to do in case of emergency\urgent symptoms   Please see AVS handed out to patient at the end of our visit for further patient instructions/ counseling done pertaining to today's office visit.   Return if symptoms worsen or fail to improve, for Follow-up with your GI doctor near future for IBS, also with [name] your PCP near future.     Note: This document was prepared occasionally using Dragon voice recognition software and may include unintentional dictation errors in addition to a scribe.   This document  serves as a record of services personally performed by Mellody Dance, DO. It was created on her behalf by Mayer Masker, a trained medical scribe. The creation of this record is based on the scribe's personal observations and the provider's statements to them.   I have reviewed the above medical documentation for accuracy and completeness and I concur.  Mellody Dance 03/30/18 5:28 PM  --------------------------------------------------------------------------------------------------------------------------------------------------------------------------------------------------------------------------------------------    Subjective:    CC:  Chief Complaint  Patient presents with  . Diarrhea    HPI: Andrea Brooks is a 82 y.o. female who presents to [place] at [facility] today for issues as discussed below.  She has a h/o diverticulitis and IBS and believes she is having a current diverticulitis flare up. She has had a colonoscopy recently 08-10-17.   Her symptoms began about 2 weeks ago. She has LLQ abdominal pain, diarrhea (which stopped 2 days ago) and constipation (which comes and goes). She ate some cheerios this morning. She tried taking tylenol without relief.   She denies nausea. She took dicyclomine before, prescribed by her GI doctor. She has taken flagyl before for a previous flare up with relief.  She describes this pain as like being in labor. Currently, she states her symptoms are mild and she is not in any severe pain. She has been eating normal foods without issue.   She usually watches what she eats. She will take out seeds of vegetables and fruits to prevent this from flaring up. She states recently she ate corn and strawberry shortcake.    Problem  Diverticulitis-  mild symptoms  Irritable Bowel Syndrome With Both Constipation and Diarrhea     Wt Readings from Last 3 Encounters:  03/30/18 195 lb 3.2 oz (88.5 kg)  03/17/18 203 lb 14.4 oz  (92.5 kg)  11/04/17 200 lb 14.4 oz (91.1 kg)   BP Readings from Last 3 Encounters:  03/30/18 131/86  03/17/18 (!) 159/76  11/04/17 138/78   BMI Readings from Last 3 Encounters:  03/30/18 33.77 kg/m  03/17/18 35.27 kg/m  11/04/17 34.76 kg/m     Patient Care Team    Relationship Specialty Notifications Start End  Esaw Grandchild, NP PCP - General Family Medicine  04/22/17      Patient Active Problem List   Diagnosis Date Noted  . Diverticulitis-  mild symptoms 03/30/2018  . Irritable bowel syndrome with both constipation and diarrhea 03/30/2018  . Healthcare maintenance 03/17/2018  . Pain of right heel 07/06/2017  . Loose stools 06/24/2017  . Right-sided thoracic back pain 06/24/2017  . Hyperlipidemia 04/22/2017  . GERD (gastroesophageal reflux disease) 04/22/2017  . Impaired glucose metabolism 04/22/2017  . HTN (hypertension) 04/22/2017  . Anxiety 04/22/2017    Past Medical history, Surgical history, Family history, Social history, Allergies and Medications have been entered into the medical record, reviewed and changed as needed.    Current Meds  Medication Sig  . aspirin EC 81 MG tablet Take 81 mg by mouth daily.  Marland Kitchen dicyclomine (BENTYL) 10 MG capsule Take 1 tab by mouth 30 min before a meal  as needed for cramping and diarrhea.  . ezetimibe-simvastatin (VYTORIN) 10-20 MG tablet Take 1 tablet by mouth daily.  Marland Kitchen LORazepam (ATIVAN) 1 MG tablet Take 1 tablet (1 mg total) by mouth daily as needed for anxiety.  . metFORMIN (GLUCOPHAGE) 500 MG tablet Take 1 tablet (500 mg total) by mouth 2 (two) times daily with a meal.  . omeprazole (PRILOSEC) 10 MG capsule Take 2.5 mg by mouth daily.   . valsartan (DIOVAN) 160 MG tablet Take 1 tablet (160 mg total) by mouth daily. PATIENT MUST HAVE OFFICE VISIT PRIOR TO ANY FURTHER REFILLS   Current Facility-Administered Medications for the 03/30/18 encounter (Office Visit) with Mellody Dance, DO  Medication  . 0.9 %  sodium chloride  infusion    Allergies:  No Known Allergies   Review of Systems: General:   Denies fever, chills, unexplained weight loss.  Optho/Auditory:   Denies visual changes, blurred vision/LOV Respiratory:   Denies wheeze, DOE more than baseline levels.  Cardiovascular:   Denies chest pain, palpitations, new onset peripheral edema  Gastrointestinal:   Denies nausea, vomiting.  Genitourinary: Denies dysuria, freq/ urgency, flank pain or discharge from genitals.  Endocrine:     Denies hot or cold intolerance, polyuria, polydipsia. Musculoskeletal:   Denies unexplained myalgias, joint swelling, unexplained arthralgias, gait problems.  Skin:  Denies new onset rash, suspicious lesions Neurological:     Denies dizziness, unexplained weakness, numbness  Psychiatric/Behavioral:   Denies mood changes, suicidal or homicidal ideations, hallucinations    Objective:   Blood pressure 131/86, pulse 87, height 5' 3.75" (1.619 m), weight 195 lb 3.2 oz (88.5 kg), SpO2 97 %. Body mass index is 33.77 kg/m. General:  Well Developed, well nourished, appropriate for stated age.  Neuro:  Alert and oriented,  extra-ocular muscles intact  HEENT:  Normocephalic, atraumatic, neck supple Skin:  no gross rash, warm, pink. Cardiac:  RRR, S1 S2 Respiratory:  ECTA B/L and A/P, Not using accessory muscles, speaking in full  sentences- unlabored. Vascular:  Ext warm, no cyanosis apprec.; cap RF less 2 sec. Psych:  No HI/SI, judgement and insight good, Euthymic mood. Full Affect.

## 2018-05-01 ENCOUNTER — Other Ambulatory Visit: Payer: Self-pay | Admitting: Adult Health

## 2018-05-12 ENCOUNTER — Encounter: Payer: Medicare Other | Admitting: Adult Health

## 2018-06-02 LAB — HM DIABETES EYE EXAM

## 2018-06-15 ENCOUNTER — Encounter: Payer: Self-pay | Admitting: Adult Health

## 2018-06-15 ENCOUNTER — Ambulatory Visit (INDEPENDENT_AMBULATORY_CARE_PROVIDER_SITE_OTHER): Payer: Medicare Other | Admitting: Adult Health

## 2018-06-15 VITALS — BP 139/74 | HR 80 | Ht 63.75 in | Wt 194.2 lb

## 2018-06-15 DIAGNOSIS — Z0000 Encounter for general adult medical examination without abnormal findings: Secondary | ICD-10-CM | POA: Diagnosis not present

## 2018-06-15 DIAGNOSIS — F419 Anxiety disorder, unspecified: Secondary | ICD-10-CM

## 2018-06-15 DIAGNOSIS — E119 Type 2 diabetes mellitus without complications: Secondary | ICD-10-CM

## 2018-06-15 DIAGNOSIS — I10 Essential (primary) hypertension: Secondary | ICD-10-CM

## 2018-06-15 DIAGNOSIS — K582 Mixed irritable bowel syndrome: Secondary | ICD-10-CM

## 2018-06-15 DIAGNOSIS — R7309 Other abnormal glucose: Secondary | ICD-10-CM

## 2018-06-15 LAB — POCT GLYCOSYLATED HEMOGLOBIN (HGB A1C): Hemoglobin A1C: 6.9 % — AB (ref 4.0–5.6)

## 2018-06-15 MED ORDER — LORAZEPAM 1 MG PO TABS: 1.0000 mg | ORAL_TABLET | Freq: Every day | ORAL | 0 refills | Status: DC | PRN

## 2018-06-15 NOTE — Assessment & Plan Note (Signed)
[place] Controlled Substance Database reviewed- no aberrancies noted She continues to use 1/2-1 tab Lorazepam 1mg  very sparingly, estimates once Q1-2 weeks This is 3rd RF on medication- Controlled Substance Contract completed today RF sent in

## 2018-06-15 NOTE — Progress Notes (Addendum)
Subjective:    Patient ID: Andrea Brooks, female    DOB: [birth year], 82 y.o.   MRN: [id number]  HPI  06/24/17 OV: Andrea Brooks is here for f/u:  HTN, T2D.  She has been taking Metformin 1000mg  BID, following a heart healthy diet, performing daily lifting/walking/moving boxes due to a move/home remodel since April 2018.  She has lost 10lbs since last OV and A1c today 6.4 (last A1c >8 in Spring 2018)-GREAT JOB!  She continues to take Vytorin10/20mg , Omeprazole 10mg , Diovan 160mg  daily. She has only required one dose of Lorazepam 1mg  Q HS in May.  She and her husband now live in 1800 sq foot "in law suite" attached to her daughter's home.  She  Reports am BS running 110-130s, denies episodes of hypoglycemia.  She denies EOTH/tobacco use. Two acute complaints today: 1)   She was hospitalized 2014 for pneumonia and strong/constant cough caused R thoracic pain that eventually resolved, since then it will re-emerge with strenuous exertion (ie. Lifting boxes/moving that last few months). A few weeks ago R thoracic "soreness" developed rated 8/10 she has been treating with heating pad and OTC Acetaminophen.  Since the move completed last month, the soreness resolved.  She just wanted to make mention in case is occurs again. 2) Loose stools accompanied with "tightness around my anus" that occurs 2-3 times a week in the morning.  She denies abdominal pain, or blood in urine/stool.  Ms. Althoff' last colonoscopy revealed a polyp and her repeat screening is due 2019.  Her mother had colon ca.  She had 10 lb wt loss since last OV, however she has been improving diet, Metformin dosage was increased in Spring, and she has been moving a lot more than usual with the move, home re-model.  03/17/18 OV: Andrea Brooks is here for regular f/u: HTN, T2D She reports medication compliance and denies SE. AM BS 120-130s, denies episodes of hypoglycemia She remains active with walking and yard/house work. Home renovation is about  "99.5% finished". She continues to use Lorazepam sparingly   06/15/2018 OV: Andrea Brooks is here for regular f/u- HTN, impaired glucose tolerance She reports increase in CHO/sugar intake, "b/c my husband keeps bringing it into the house" She reports AM BS 100-130s, denies episodes of hypoglycemia She estimates to use 1/2 tab of Lorazepam 1mg  once every 1-2 weeks She denies sedation or hx of falls She estimates to drink >75 ox water/day She continue to remain quite active She continues to experience IBS-D, she has upcoming appt with GI on 06/22/18  Lab Results  Component Value Date   HGBA1C 6.9 (A) 06/15/2018   HGBA1C 5.9 03/17/2018   HGBA1C 6.4 (H) 11/04/2017   Patient Care Team    Relationship Specialty Notifications Start End  Esaw Grandchild, NP PCP - General Family Medicine  04/22/17     Patient Active Problem List   Diagnosis Date Noted  . Diabetes mellitus without complication ([facility]) 79/12/4095  . Diverticulitis-  mild symptoms 03/30/2018  . Irritable bowel syndrome with both constipation and diarrhea 03/30/2018  . Healthcare maintenance 03/17/2018  . Pain of right heel 07/06/2017  . Loose stools 06/24/2017  . Right-sided thoracic back pain 06/24/2017  . Hyperlipidemia 04/22/2017  . GERD (gastroesophageal reflux disease) 04/22/2017  . Impaired glucose metabolism 04/22/2017  . HTN (hypertension) 04/22/2017  . Anxiety 04/22/2017     Past Medical History:  Diagnosis Date  . Anxiety   . Colon polyps   . Diabetes mellitus without complication ([place])   .  Diverticulosis   . Gallstones   . GERD (gastroesophageal reflux disease)   . Hyperlipidemia   . Hypertension   . Obesity      Past Surgical History:  Procedure Laterality Date  . ABDOMINAL HYSTERECTOMY     total  . BREAST BIOPSY Left    neg  . CHOLECYSTECTOMY    . JOINT REPLACEMENT Bilateral    knee  . REPLACEMENT TOTAL KNEE BILATERAL       Family History  Problem Relation Age of Onset  . Hypertension  Mother   . Colon cancer Mother   . Stroke Father   . Heart failure Father   . Stroke Sister   . Stroke Brother   . Graves' disease Daughter   . Cancer Paternal Aunt        breast  . Breast cancer Paternal Aunt   . Graves' disease Sister   . Bone cancer Sister   . Breast cancer Maternal Aunt   . Esophageal cancer Neg Hx   . Rectal cancer Neg Hx   . Liver cancer Neg Hx      Social History   Substance and Sexual Activity  Drug Use No     Social History   Substance and Sexual Activity  Alcohol Use No     Social History   Tobacco Use  Smoking Status Former Smoker  . Packs/day: 1.00  . Years: 2.00  . Pack years: 2.00  . Last attempt to quit: 11/24/1959  . Years since quitting: 58.5  Smokeless Tobacco Never Used     Outpatient Encounter Medications as of 06/15/2018  Medication Sig  . aspirin EC 81 MG tablet Take 81 mg by mouth daily.  Marland Kitchen dicyclomine (BENTYL) 10 MG capsule Take 1 tab by mouth 30 min before a meal  as needed for cramping and diarrhea.  . ezetimibe-simvastatin (VYTORIN) 10-20 MG tablet Take 1 tablet by mouth daily.  Marland Kitchen LORazepam (ATIVAN) 1 MG tablet Take 1 tablet (1 mg total) by mouth daily as needed for anxiety.  . metFORMIN (GLUCOPHAGE) 500 MG tablet TAKE 1 TABLET TWICE A DAY WITH MEALS  . omeprazole (PRILOSEC) 10 MG capsule Take 2.5 mg by mouth daily.   . valsartan (DIOVAN) 160 MG tablet Take 1 tablet (160 mg total) by mouth daily. PATIENT MUST HAVE OFFICE VISIT PRIOR TO ANY FURTHER REFILLS  . [DISCONTINUED] LORazepam (ATIVAN) 1 MG tablet Take 1 tablet (1 mg total) by mouth daily as needed for anxiety.   Facility-Administered Encounter Medications as of 06/15/2018  Medication  . 0.9 %  sodium chloride infusion    Allergies: Patient has no known allergies.  Body mass index is 33.6 kg/m.  Blood pressure 139/74, pulse 80, height 5' 3.75" (1.619 m), weight 194 lb 3.2 oz (88.1 kg), SpO2 99 %.  Review of Systems  Constitutional: Positive for  fatigue. Negative for activity change, appetite change, chills, diaphoresis, fever and unexpected weight change.  Eyes: Negative for visual disturbance.  Respiratory: Negative for cough, chest tightness, shortness of breath, wheezing and stridor.   Cardiovascular: Negative for chest pain, palpitations and leg swelling.  Gastrointestinal: Negative for abdominal distention, abdominal pain, blood in stool, constipation, diarrhea, nausea and vomiting.  Endocrine: Negative for cold intolerance, heat intolerance, polydipsia, polyphagia and polyuria.  Genitourinary: Negative for difficulty urinating, flank pain and hematuria.  Musculoskeletal: Positive for arthralgias and myalgias. Negative for back pain, gait problem, joint swelling, neck pain and neck stiffness.  Skin: Negative for color change, pallor, rash and wound.  Allergic/Immunologic: Negative for immunocompromised state.  Neurological: Negative for dizziness, tremors, weakness and headaches.  Hematological: Does not bruise/bleed easily.  Psychiatric/Behavioral: Negative for agitation, dysphoric mood, self-injury, sleep disturbance and suicidal ideas. The patient is not nervous/anxious.        Objective:   Physical Exam  Constitutional: She is oriented to person, place, and time. She appears well-developed and well-nourished. No distress.  HENT:  Head: Normocephalic and atraumatic.  Right Ear: External ear normal.  Left Ear: External ear normal.  Eyes: Pupils are equal, round, and reactive to light. Conjunctivae are normal.  Cardiovascular: Normal rate, regular rhythm, normal heart sounds and intact distal pulses.  No murmur heard. Pulmonary/Chest: Breath sounds normal. No respiratory distress. She has no wheezes. She has no rales. She exhibits no tenderness.  Neurological: She is alert and oriented to person, place, and time. Coordination normal.  Skin: Skin is warm and dry. No rash noted. She is not diaphoretic. No erythema. No  pallor.  Psychiatric: She has a normal mood and affect. Her behavior is normal. Judgment and thought content normal.  Nursing note and vitals reviewed.     Assessment & Plan:   1. Impaired glucose metabolism   2. Hypertension, unspecified type   3. Diabetes mellitus without complication ([facility])   4. Anxiety   5. Irritable bowel syndrome with both constipation and diarrhea   6. Healthcare maintenance     HTN (hypertension) BP at goal 139/74, HR 80 Continue on Valsartan 160mg  QD  Diabetes mellitus without complication ([facility]) Lab Results  Component Value Date   HGBA1C 6.9 (A) 06/15/2018   HGBA1C 5.9 03/17/2018   HGBA1C 6.4 (H) 11/04/2017   Continue Metformin 500mg  BID Reduce sugar intake by 50% Reduce overall CHO intake and continue to move as often as possible.   [place] Controlled Substance Database reviewed- no aberrancies noted She continues to use 1/2-1 tab Lorazepam 1mg  very sparingly, estimates once Q1-2 weeks This is 3rd RF on medication- Controlled Substance Contract completed today RF sent in   Irritable bowel syndrome with both constipation and diarrhea Has appt with GI on 06/22/18  Healthcare maintenance Slight increase in A1c- reduce sugar intake by at least 50%. Continue to remain as active as possible. Follow diabetic diet and continue checking your blood sugar. Controlled Substance Contract completed today- refill sent in on Lorazepam- continue to use sparingly. Follow-up in 3 months, sooner if needed.    FOLLOW-UP:  No follow-ups on file.

## 2018-06-15 NOTE — Assessment & Plan Note (Signed)
Has appt with GI on 06/22/18

## 2018-06-15 NOTE — Assessment & Plan Note (Signed)
BP at goal 139/74, HR 80 Continue on Valsartan 160mg  QD

## 2018-06-15 NOTE — Assessment & Plan Note (Signed)
Slight increase in A1c- reduce sugar intake by at least 50%. Continue to remain as active as possible. Follow diabetic diet and continue checking your blood sugar. Controlled Substance Contract completed today- refill sent in on Lorazepam- continue to use sparingly. Follow-up in 3 months, sooner if needed.

## 2018-06-15 NOTE — Patient Instructions (Signed)
Diabetes Mellitus and Nutrition When you have diabetes (diabetes mellitus), it is very important to have healthy eating habits because your blood sugar (glucose) levels are greatly affected by what you eat and drink. Eating healthy foods in the appropriate amounts, at about the same times every day, can help you:  Control your blood glucose.  Lower your risk of heart disease.  Improve your blood pressure.  Reach or maintain a healthy weight.  Every person with diabetes is different, and each person has different needs for a meal plan. Your health care provider may recommend that you work with a diet and nutrition specialist (dietitian) to make a meal plan that is best for you. Your meal plan may vary depending on factors such as:  The calories you need.  The medicines you take.  Your weight.  Your blood glucose, blood pressure, and cholesterol levels.  Your activity level.  Other health conditions you have, such as heart or kidney disease.  How do carbohydrates affect me? Carbohydrates affect your blood glucose level more than any other type of food. Eating carbohydrates naturally increases the amount of glucose in your blood. Carbohydrate counting is a method for keeping track of how many carbohydrates you eat. Counting carbohydrates is important to keep your blood glucose at a healthy level, especially if you use insulin or take certain oral diabetes medicines. It is important to know how many carbohydrates you can safely have in each meal. This is different for every person. Your dietitian can help you calculate how many carbohydrates you should have at each meal and for snack. Foods that contain carbohydrates include:  Bread, cereal, rice, pasta, and crackers.  Potatoes and corn.  Peas, beans, and lentils.  Milk and yogurt.  Fruit and juice.  Desserts, such as cakes, cookies, ice cream, and candy.  How does alcohol affect me? Alcohol can cause a sudden decrease in blood  glucose (hypoglycemia), especially if you use insulin or take certain oral diabetes medicines. Hypoglycemia can be a life-threatening condition. Symptoms of hypoglycemia (sleepiness, dizziness, and confusion) are similar to symptoms of having too much alcohol. If your health care provider says that alcohol is safe for you, follow these guidelines:  Limit alcohol intake to no more than 1 drink per day for nonpregnant women and 2 drinks per day for men. One drink equals 12 oz of beer, 5 oz of wine, or 1 oz of hard liquor.  Do not drink on an empty stomach.  Keep yourself hydrated with water, diet soda, or unsweetened iced tea.  Keep in mind that regular soda, juice, and other mixers may contain a lot of sugar and must be counted as carbohydrates.  What are tips for following this plan? Reading food labels  Start by checking the serving size on the label. The amount of calories, carbohydrates, fats, and other nutrients listed on the label are based on one serving of the food. Many foods contain more than one serving per package.  Check the total grams (g) of carbohydrates in one serving. You can calculate the number of servings of carbohydrates in one serving by dividing the total carbohydrates by 15. For example, if a food has 30 g of total carbohydrates, it would be equal to 2 servings of carbohydrates.  Check the number of grams (g) of saturated and trans fats in one serving. Choose foods that have low or no amount of these fats.  Check the number of milligrams (mg) of sodium in one serving. Most people   should limit total sodium intake to less than 2,300 mg per day.  Always check the nutrition information of foods labeled as "low-fat" or "nonfat". These foods may be higher in added sugar or refined carbohydrates and should be avoided.  Talk to your dietitian to identify your daily goals for nutrients listed on the label. Shopping  Avoid buying canned, premade, or processed foods. These  foods tend to be high in fat, sodium, and added sugar.  Shop around the outside edge of the grocery store. This includes fresh fruits and vegetables, bulk grains, fresh meats, and fresh dairy. Cooking  Use low-heat cooking methods, such as baking, instead of high-heat cooking methods like deep frying.  Cook using healthy oils, such as olive, canola, or sunflower oil.  Avoid cooking with butter, cream, or high-fat meats. Meal planning  Eat meals and snacks regularly, preferably at the same times every day. Avoid going long periods of time without eating.  Eat foods high in fiber, such as fresh fruits, vegetables, beans, and whole grains. Talk to your dietitian about how many servings of carbohydrates you can eat at each meal.  Eat 4-6 ounces of lean protein each day, such as lean meat, chicken, fish, eggs, or tofu. 1 ounce is equal to 1 ounce of meat, chicken, or fish, 1 egg, or 1/4 cup of tofu.  Eat some foods each day that contain healthy fats, such as avocado, nuts, seeds, and fish. Lifestyle   Check your blood glucose regularly.  Exercise at least 30 minutes 5 or more days each week, or as told by your health care provider.  Take medicines as told by your health care provider.  Do not use any products that contain nicotine or tobacco, such as cigarettes and e-cigarettes. If you need help quitting, ask your health care provider.  Work with a Social worker or diabetes educator to identify strategies to manage stress and any emotional and social challenges. What are some questions to ask my health care provider?  Do I need to meet with a diabetes educator?  Do I need to meet with a dietitian?  What number can I call if I have questions?  When are the best times to check my blood glucose? Where to find more information:  American Diabetes Association: diabetes.org/food-and-fitness/food  Academy of Nutrition and Dietetics:  PokerClues.dk  [employer] of Diabetes and Digestive and Kidney Diseases (NIH): ContactWire.be Summary  A healthy meal plan will help you control your blood glucose and maintain a healthy lifestyle.  Working with a diet and nutrition specialist (dietitian) can help you make a meal plan that is best for you.  Keep in mind that carbohydrates and alcohol have immediate effects on your blood glucose levels. It is important to count carbohydrates and to use alcohol carefully. This information is not intended to replace advice given to you by your health care provider. Make sure you discuss any questions you have with your health care provider. Document Released: 08/06/2005 Document Revised: 12/14/2016 Document Reviewed: 12/14/2016 Elsevier Interactive Patient Education  2018 [employer].  Insomnia Insomnia is a sleep disorder that makes it difficult to fall asleep or to stay asleep. Insomnia can cause tiredness (fatigue), low energy, difficulty concentrating, mood swings, and poor performance at work or school. There are three different ways to classify insomnia:  Difficulty falling asleep.  Difficulty staying asleep.  Waking up too early in the morning.  Any type of insomnia can be long-term (chronic) or short-term (acute). Both are common. Short-term insomnia  usually lasts for three months or less. Chronic insomnia occurs at least three times a week for longer than three months. What are the causes? Insomnia may be caused by another condition, situation, or substance, such as:  Anxiety.  Certain medicines.  Gastroesophageal reflux disease (GERD) or other gastrointestinal conditions.  Asthma or other breathing conditions.  Restless legs syndrome, sleep apnea, or other sleep disorders.  Chronic pain.  Menopause. This may include hot  flashes.  Stroke.  Abuse of alcohol, tobacco, or illegal drugs.  Depression.  Caffeine.  Neurological disorders, such as Alzheimer disease.  An overactive thyroid (hyperthyroidism).  The cause of insomnia may not be known. What increases the risk? Risk factors for insomnia include:  Gender. Women are more commonly affected than men.  Age. Insomnia is more common as you get older.  Stress. This may involve your professional or personal life.  Income. Insomnia is more common in people with lower income.  Lack of exercise.  Irregular work schedule or night shifts.  Traveling between different time zones.  What are the signs or symptoms? If you have insomnia, trouble falling asleep or trouble staying asleep is the main symptom. This may lead to other symptoms, such as:  Feeling fatigued.  Feeling nervous about going to sleep.  Not feeling rested in the morning.  Having trouble concentrating.  Feeling irritable, anxious, or depressed.  How is this treated? Treatment for insomnia depends on the cause. If your insomnia is caused by an underlying condition, treatment will focus on addressing the condition. Treatment may also include:  Medicines to help you sleep.  Counseling or therapy.  Lifestyle adjustments.  Follow these instructions at home:  Take medicines only as directed by your health care provider.  Keep regular sleeping and waking hours. Avoid naps.  Keep a sleep diary to help you and your health care provider figure out what could be causing your insomnia. Include: ? When you sleep. ? When you wake up during the night. ? How well you sleep. ? How rested you feel the next day. ? Any side effects of medicines you are taking. ? What you eat and drink.  Make your bedroom a comfortable place where it is easy to fall asleep: ? Put up shades or special blackout curtains to block light from outside. ? Use a white noise machine to block noise. ? Keep  the temperature cool.  Exercise regularly as directed by your health care provider. Avoid exercising right before bedtime.  Use relaxation techniques to manage stress. Ask your health care provider to suggest some techniques that may work well for you. These may include: ? Breathing exercises. ? Routines to release muscle tension. ? Visualizing peaceful scenes.  Cut back on alcohol, caffeinated beverages, and cigarettes, especially close to bedtime. These can disrupt your sleep.  Do not overeat or eat spicy foods right before bedtime. This can lead to digestive discomfort that can make it hard for you to sleep.  Limit screen use before bedtime. This includes: ? Watching TV. ? Using your smartphone, tablet, and computer.  Stick to a routine. This can help you fall asleep faster. Try to do a quiet activity, brush your teeth, and go to bed at the same time each night.  Get out of bed if you are still awake after 15 minutes of trying to sleep. Keep the lights down, but try reading or doing a quiet activity. When you feel sleepy, go back to bed.  Make sure that you drive  carefully. Avoid driving if you feel very sleepy.  Keep all follow-up appointments as directed by your health care provider. This is important. Contact a health care provider if:  You are tired throughout the day or have trouble in your daily routine due to sleepiness.  You continue to have sleep problems or your sleep problems get worse. Get help right away if:  You have serious thoughts about hurting yourself or someone else. This information is not intended to replace advice given to you by your health care provider. Make sure you discuss any questions you have with your health care provider. Document Released: 11/06/2000 Document Revised: 04/10/2016 Document Reviewed: 08/10/2014 Elsevier Interactive Patient Education  2018 [place].  Slight increase in A1c- reduce sugar intake by at least 50%. Continue to  remain as active as possible. Follow diabetic diet and continue checking your blood sugar. Controlled Substance Contract completed today- refill sent in on Lorazepam- continue to use sparingly. Follow-up in 3 months, sooner if needed. NICE TO SEE YOU!

## 2018-06-15 NOTE — Assessment & Plan Note (Signed)
Lab Results  Component Value Date   HGBA1C 6.9 (A) 06/15/2018   HGBA1C 5.9 03/17/2018   HGBA1C 6.4 (H) 11/04/2017   Continue Metformin 500mg  BID Reduce sugar intake by 50% Reduce overall CHO intake and continue to move as often as possible.

## 2018-06-15 NOTE — Addendum Note (Signed)
Addended by: Mina Marble D on: 06/15/2018 12:00 PM   Modules accepted: Level of Service

## 2018-06-17 ENCOUNTER — Other Ambulatory Visit: Payer: Self-pay

## 2018-06-17 DIAGNOSIS — C4492 Squamous cell carcinoma of skin, unspecified: Secondary | ICD-10-CM

## 2018-06-17 HISTORY — DX: Squamous cell carcinoma of skin, unspecified: C44.92

## 2018-06-21 ENCOUNTER — Ambulatory Visit: Payer: Medicare Other | Admitting: Gastroenterology

## 2018-06-27 ENCOUNTER — Telehealth: Payer: Self-pay | Admitting: Adult Health

## 2018-06-27 ENCOUNTER — Telehealth: Payer: Self-pay | Admitting: Gastroenterology

## 2018-06-27 NOTE — Telephone Encounter (Signed)
Pt states that she has been having watery diarrhea x 2 days.  She also called GI and was instructed to use Gatorade, but they could not get her in for an OV.  Pt denies any other symptoms.  Discussed with pt proper use of Imodium as she was taking it incorrectly.  Advised pt to follow directions on box and to drink either Gatorade or Pedialyte to prevent dehydration.  Pt expressed understanding and is agreeable.  Charyl Bigger, CMA

## 2018-06-27 NOTE — Telephone Encounter (Addendum)
(  I am seeing this after hours.)  Andrea Brooks, I have not seen this patient since her colonoscopy last year, and am afraid I do not have an appointment sooner than what she has been given.  I recommend imodium and a visit with either her primary care provider or an urgent care to make sure she is not dehydrated and perhaps perform some stool testing if they think appropriate after seeing her.

## 2018-06-27 NOTE — Telephone Encounter (Signed)
Patient has been having diarrhea since Saturday. Denies abdominal pain, no N/V. Numerous stools daily, very watery. She has tried several doses of imodium, not really helping much. She did start drinking some Gatorade yesterday, did eat some oats/banana this morning.

## 2018-06-27 NOTE — Telephone Encounter (Signed)
Patient called states she has had severe diarrhea for the past 48 hrs and doesn't know what to do-- Pt wants to know if she should come in to be seen by PCP or go to ED.  --advised pt to contact GI provider to see what they suggest & I am forwarding message to medical assistant to review w/ provider & call pt back w/advice@ [phone]  --glh

## 2018-06-28 ENCOUNTER — Emergency Department (HOSPITAL_BASED_OUTPATIENT_CLINIC_OR_DEPARTMENT_OTHER): Payer: Medicare Other

## 2018-06-28 ENCOUNTER — Other Ambulatory Visit: Payer: Self-pay

## 2018-06-28 ENCOUNTER — Inpatient Hospital Stay (HOSPITAL_BASED_OUTPATIENT_CLINIC_OR_DEPARTMENT_OTHER)
Admission: EM | Admit: 2018-06-28 | Discharge: 2018-07-02 | DRG: 682 | Disposition: A | Discharge status: Home / Self Care | Payer: Medicare Other | Attending: Internal Medicine | Admitting: Internal Medicine

## 2018-06-28 ENCOUNTER — Encounter (HOSPITAL_BASED_OUTPATIENT_CLINIC_OR_DEPARTMENT_OTHER): Payer: Self-pay | Admitting: *Deleted

## 2018-06-28 DIAGNOSIS — E860 Dehydration: Secondary | ICD-10-CM

## 2018-06-28 DIAGNOSIS — I152 Hypertension secondary to endocrine disorders: Secondary | ICD-10-CM | POA: Diagnosis present

## 2018-06-28 DIAGNOSIS — R197 Diarrhea, unspecified: Secondary | ICD-10-CM

## 2018-06-28 DIAGNOSIS — K219 Gastro-esophageal reflux disease without esophagitis: Secondary | ICD-10-CM | POA: Diagnosis present

## 2018-06-28 DIAGNOSIS — I700 Atherosclerosis of aorta: Secondary | ICD-10-CM | POA: Diagnosis not present

## 2018-06-28 DIAGNOSIS — A09 Infectious gastroenteritis and colitis, unspecified: Secondary | ICD-10-CM | POA: Diagnosis not present

## 2018-06-28 DIAGNOSIS — I5031 Acute diastolic (congestive) heart failure: Secondary | ICD-10-CM | POA: Diagnosis not present

## 2018-06-28 DIAGNOSIS — Z9049 Acquired absence of other specified parts of digestive tract: Secondary | ICD-10-CM

## 2018-06-28 DIAGNOSIS — R748 Abnormal levels of other serum enzymes: Secondary | ICD-10-CM | POA: Diagnosis not present

## 2018-06-28 DIAGNOSIS — E876 Hypokalemia: Secondary | ICD-10-CM | POA: Diagnosis not present

## 2018-06-28 DIAGNOSIS — N183 Chronic kidney disease, stage 3 (moderate): Secondary | ICD-10-CM | POA: Diagnosis not present

## 2018-06-28 DIAGNOSIS — E785 Hyperlipidemia, unspecified: Secondary | ICD-10-CM | POA: Diagnosis not present

## 2018-06-28 DIAGNOSIS — A040 Enteropathogenic Escherichia coli infection: Secondary | ICD-10-CM | POA: Diagnosis present

## 2018-06-28 DIAGNOSIS — I2510 Atherosclerotic heart disease of native coronary artery without angina pectoris: Secondary | ICD-10-CM | POA: Diagnosis not present

## 2018-06-28 DIAGNOSIS — E1122 Type 2 diabetes mellitus with diabetic chronic kidney disease: Secondary | ICD-10-CM | POA: Diagnosis not present

## 2018-06-28 DIAGNOSIS — I051 Rheumatic mitral insufficiency: Secondary | ICD-10-CM | POA: Diagnosis not present

## 2018-06-28 DIAGNOSIS — I214 Non-ST elevation (NSTEMI) myocardial infarction: Secondary | ICD-10-CM | POA: Diagnosis not present

## 2018-06-28 DIAGNOSIS — Z7984 Long term (current) use of oral hypoglycemic drugs: Secondary | ICD-10-CM | POA: Diagnosis not present

## 2018-06-28 DIAGNOSIS — N179 Acute kidney failure, unspecified: Secondary | ICD-10-CM | POA: Diagnosis present

## 2018-06-28 DIAGNOSIS — Z96653 Presence of artificial knee joint, bilateral: Secondary | ICD-10-CM | POA: Diagnosis present

## 2018-06-28 DIAGNOSIS — Z90710 Acquired absence of both cervix and uterus: Secondary | ICD-10-CM

## 2018-06-28 DIAGNOSIS — I130 Hypertensive heart and chronic kidney disease with heart failure and stage 1 through stage 4 chronic kidney disease, or unspecified chronic kidney disease: Secondary | ICD-10-CM | POA: Diagnosis present

## 2018-06-28 DIAGNOSIS — Z7982 Long term (current) use of aspirin: Secondary | ICD-10-CM | POA: Diagnosis not present

## 2018-06-28 DIAGNOSIS — E669 Obesity, unspecified: Secondary | ICD-10-CM | POA: Diagnosis present

## 2018-06-28 DIAGNOSIS — Z6833 Body mass index (BMI) 33.0-33.9, adult: Secondary | ICD-10-CM | POA: Diagnosis not present

## 2018-06-28 DIAGNOSIS — I248 Other forms of acute ischemic heart disease: Secondary | ICD-10-CM | POA: Diagnosis present

## 2018-06-28 DIAGNOSIS — Z823 Family history of stroke: Secondary | ICD-10-CM | POA: Diagnosis not present

## 2018-06-28 DIAGNOSIS — E119 Type 2 diabetes mellitus without complications: Secondary | ICD-10-CM | POA: Diagnosis not present

## 2018-06-28 DIAGNOSIS — R7989 Other specified abnormal findings of blood chemistry: Secondary | ICD-10-CM | POA: Diagnosis not present

## 2018-06-28 DIAGNOSIS — K580 Irritable bowel syndrome with diarrhea: Secondary | ICD-10-CM | POA: Diagnosis not present

## 2018-06-28 DIAGNOSIS — Z87891 Personal history of nicotine dependence: Secondary | ICD-10-CM

## 2018-06-28 DIAGNOSIS — I420 Dilated cardiomyopathy: Secondary | ICD-10-CM | POA: Diagnosis not present

## 2018-06-28 DIAGNOSIS — Z8249 Family history of ischemic heart disease and other diseases of the circulatory system: Secondary | ICD-10-CM | POA: Diagnosis not present

## 2018-06-28 DIAGNOSIS — F419 Anxiety disorder, unspecified: Secondary | ICD-10-CM | POA: Diagnosis present

## 2018-06-28 DIAGNOSIS — J90 Pleural effusion, not elsewhere classified: Secondary | ICD-10-CM | POA: Diagnosis not present

## 2018-06-28 DIAGNOSIS — Z86010 Personal history of colonic polyps: Secondary | ICD-10-CM | POA: Diagnosis not present

## 2018-06-28 DIAGNOSIS — Z79899 Other long term (current) drug therapy: Secondary | ICD-10-CM | POA: Diagnosis not present

## 2018-06-28 DIAGNOSIS — I10 Essential (primary) hypertension: Secondary | ICD-10-CM | POA: Diagnosis not present

## 2018-06-28 DIAGNOSIS — I5033 Acute on chronic diastolic (congestive) heart failure: Secondary | ICD-10-CM | POA: Diagnosis not present

## 2018-06-28 LAB — URINALYSIS, ROUTINE W REFLEX MICROSCOPIC
Bilirubin Urine: NEGATIVE
GLUCOSE, UA: NEGATIVE mg/dL
Hgb urine dipstick: NEGATIVE
KETONES UR: NEGATIVE mg/dL
LEUKOCYTES UA: NEGATIVE
NITRITE: NEGATIVE
PROTEIN: NEGATIVE mg/dL
Specific Gravity, Urine: <1.005 — ABNORMAL LOW (ref 1.005–1.030)
pH: 6 (ref 5.0–8.0)

## 2018-06-28 LAB — CBC
HEMATOCRIT: 35.5 % — AB (ref 36.0–46.0)
Hemoglobin: 11.6 g/dL — ABNORMAL LOW (ref 12.0–15.0)
MCH: 28.8 pg (ref 26.0–34.0)
MCHC: 32.7 g/dL (ref 30.0–36.0)
MCV: 88.1 fL (ref 78.0–100.0)
PLATELETS: 173 10*3/uL (ref 150–400)
RBC: 4.03 MIL/uL (ref 3.87–5.11)
RDW: 14.4 % (ref 11.5–15.5)
WBC: 5.4 10*3/uL (ref 4.0–10.5)

## 2018-06-28 LAB — CBC WITH DIFFERENTIAL/PLATELET
BASOS ABS: 0 10*3/uL (ref 0.0–0.1)
Basophils Relative: 0 %
Eosinophils Absolute: 0.1 10*3/uL (ref 0.0–0.7)
Eosinophils Relative: 1 %
HEMATOCRIT: 39.3 % (ref 36.0–46.0)
Hemoglobin: 13.1 g/dL (ref 12.0–15.0)
LYMPHS PCT: 19 %
Lymphs Abs: 1.1 10*3/uL (ref 0.7–4.0)
MCH: 29 pg (ref 26.0–34.0)
MCHC: 33.3 g/dL (ref 30.0–36.0)
MCV: 86.9 fL (ref 78.0–100.0)
MONO ABS: 1.1 10*3/uL — AB (ref 0.1–1.0)
Monocytes Relative: 19 %
NEUTROS ABS: 3.4 10*3/uL (ref 1.7–7.7)
Neutrophils Relative %: 61 %
PLATELETS: 172 10*3/uL (ref 150–400)
RBC: 4.52 MIL/uL (ref 3.87–5.11)
RDW: 14.6 % (ref 11.5–15.5)
WBC: 5.6 10*3/uL (ref 4.0–10.5)

## 2018-06-28 LAB — COMPREHENSIVE METABOLIC PANEL
ALT: 20 U/L (ref 0–44)
ANION GAP: 11 (ref 5–15)
AST: 29 U/L (ref 15–41)
Albumin: 3.6 g/dL (ref 3.5–5.0)
Alkaline Phosphatase: 61 U/L (ref 38–126)
BILIRUBIN TOTAL: 0.5 mg/dL (ref 0.3–1.2)
BUN: 25 mg/dL — ABNORMAL HIGH (ref 8–23)
CHLORIDE: 105 mmol/L (ref 98–111)
CO2: 22 mmol/L (ref 22–32)
Calcium: 8.7 mg/dL — ABNORMAL LOW (ref 8.9–10.3)
Creatinine, Ser: 2.53 mg/dL — ABNORMAL HIGH (ref 0.44–1.00)
GFR, EST AFRICAN AMERICAN: 20 mL/min — AB (ref >60)
GFR, EST NON AFRICAN AMERICAN: 17 mL/min — AB (ref >60)
Glucose, Bld: 114 mg/dL — ABNORMAL HIGH (ref 70–99)
POTASSIUM: 3.9 mmol/L (ref 3.5–5.1)
Sodium: 138 mmol/L (ref 135–145)
TOTAL PROTEIN: 7.5 g/dL (ref 6.5–8.1)

## 2018-06-28 LAB — CREATININE, SERUM
Creatinine, Ser: 2.54 mg/dL — ABNORMAL HIGH (ref 0.44–1.00)
GFR, EST AFRICAN AMERICAN: 20 mL/min — AB (ref >60)
GFR, EST NON AFRICAN AMERICAN: 17 mL/min — AB (ref >60)

## 2018-06-28 LAB — C DIFFICILE QUICK SCREEN W PCR REFLEX
C DIFFICILE (CDIFF) INTERP: NOT DETECTED
C DIFFICILE (CDIFF) TOXIN: NEGATIVE
C Diff antigen: NEGATIVE

## 2018-06-28 LAB — HEMOGLOBIN A1C
Hgb A1c MFr Bld: 6.3 % — ABNORMAL HIGH (ref 4.8–5.6)
Mean Plasma Glucose: 134.11 mg/dL

## 2018-06-28 LAB — LIPASE, BLOOD: LIPASE: 29 U/L (ref 11–51)

## 2018-06-28 MED ORDER — INSULIN ASPART 100 UNIT/ML ~~LOC~~ SOLN
0.0000 [IU] | Freq: Three times a day (TID) | SUBCUTANEOUS | Status: DC
  Administered 2018-06-29: 1 [IU] via SUBCUTANEOUS

## 2018-06-28 MED ORDER — ACETAMINOPHEN 325 MG PO TABS: 650.0000 mg | ORAL_TABLET | Freq: Four times a day (QID) | ORAL | Status: DC | PRN

## 2018-06-28 MED ORDER — ASPIRIN EC 81 MG PO TBEC
81.0000 mg | DELAYED_RELEASE_TABLET | Freq: Every day | ORAL | Status: DC
  Administered 2018-06-29 – 2018-07-02 (×4): 81 mg via ORAL
  Filled 2018-06-28 (×4): qty 1

## 2018-06-28 MED ORDER — PANTOPRAZOLE SODIUM 20 MG PO TBEC
20.0000 mg | DELAYED_RELEASE_TABLET | Freq: Every day | ORAL | Status: DC
  Administered 2018-06-29 – 2018-07-02 (×4): 20 mg via ORAL
  Filled 2018-06-28 (×4): qty 1

## 2018-06-28 MED ORDER — LORAZEPAM 1 MG PO TABS: 1.0000 mg | ORAL_TABLET | Freq: Every day | ORAL | Status: DC | PRN

## 2018-06-28 MED ORDER — LACTATED RINGERS IV SOLN
INTRAVENOUS | Status: DC
  Administered 2018-06-28 – 2018-06-29 (×3): via INTRAVENOUS

## 2018-06-28 MED ORDER — INSULIN ASPART 100 UNIT/ML ~~LOC~~ SOLN: 0.0000 [IU] | Freq: Every day | SUBCUTANEOUS | Status: DC

## 2018-06-28 MED ORDER — SODIUM CHLORIDE 0.9 % IV BOLUS
1000.0000 mL | Freq: Once | INTRAVENOUS | Status: AC
  Administered 2018-06-28: 1000 mL via INTRAVENOUS

## 2018-06-28 MED ORDER — EZETIMIBE-SIMVASTATIN 10-20 MG PO TABS
1.0000 | ORAL_TABLET | Freq: Every day | ORAL | Status: DC
  Administered 2018-06-29 – 2018-07-02 (×4): 1 via ORAL
  Filled 2018-06-28 (×4): qty 1

## 2018-06-28 MED ORDER — ACETAMINOPHEN 650 MG RE SUPP: 650.0000 mg | Freq: Four times a day (QID) | RECTAL | Status: DC | PRN

## 2018-06-28 MED ORDER — IOPAMIDOL (ISOVUE-300) INJECTION 61%
30.0000 mL | Freq: Once | INTRAVENOUS | Status: AC | PRN
  Administered 2018-06-28: 30 mL via ORAL

## 2018-06-28 MED ORDER — HEPARIN SODIUM (PORCINE) 5000 UNIT/ML IJ SOLN
5000.0000 [IU] | Freq: Three times a day (TID) | INTRAMUSCULAR | Status: DC
  Administered 2018-06-28 – 2018-07-01 (×10): 5000 [IU] via SUBCUTANEOUS
  Filled 2018-06-28 (×11): qty 1

## 2018-06-28 NOTE — ED Provider Notes (Signed)
Emergency Department Provider Note   I have reviewed the triage vital signs and the nursing notes.   HISTORY  Chief Complaint Weakness and Diarrhea   HPI Andrea Brooks is a 82 y.o. female with PMH of DM, Diverticulosis, HLD, HTN, and elevated BMI from and for evaluation of watery diarrhea for the past 4 days.  Patient denies any abdominal pain or cramping.  He is able to drink fluids but does not feel like eating solid foods.  She has not seen any blood in the bowel movements.  She has been taking Imodium and trying to drink Gatorade and Pedialyte.  She has not been on antibiotics in the last 30 days.  She denies any recent travel history.  She states that her husband had some mild GI illness 1 to 2 weeks ago but nothing this severe.  She does have history of diverticulitis and reports a history of IBS but states the flare symptoms are not felt like this in the past. No UTI symptoms.    Past Medical History:  Diagnosis Date  . Anxiety   . Colon polyps   . Diabetes mellitus without complication ([place])   . Diverticulosis   . Gallstones   . GERD (gastroesophageal reflux disease)   . Hyperlipidemia   . Hypertension   . Obesity     Patient Active Problem List   Diagnosis Date Noted  . AKI (acute kidney injury) ([place]) 06/28/2018  . Watery diarrhea 06/28/2018  . Diabetes mellitus without complication ([place]) 39/76/7341  . Diverticulitis-  mild symptoms 03/30/2018  . Irritable bowel syndrome with both constipation and diarrhea 03/30/2018  . Healthcare maintenance 03/17/2018  . Pain of right heel 07/06/2017  . Loose stools 06/24/2017  . Right-sided thoracic back pain 06/24/2017  . Hyperlipidemia 04/22/2017  . GERD (gastroesophageal reflux disease) 04/22/2017  . Impaired glucose metabolism 04/22/2017  . Essential hypertension 04/22/2017  . Anxiety 04/22/2017    Past Surgical History:  Procedure Laterality Date  . ABDOMINAL HYSTERECTOMY     total  . BREAST BIOPSY Left    neg   . CHOLECYSTECTOMY    . JOINT REPLACEMENT Bilateral    knee  . REPLACEMENT TOTAL KNEE BILATERAL      Allergies Patient has no known allergies.  Family History  Problem Relation Age of Onset  . Hypertension Mother   . Colon cancer Mother   . Stroke Father   . Heart failure Father   . Stroke Sister   . Stroke Brother   . Graves' disease Daughter   . Cancer Paternal Aunt        breast  . Breast cancer Paternal Aunt   . Graves' disease Sister   . Bone cancer Sister   . Breast cancer Maternal Aunt   . Esophageal cancer Neg Hx   . Rectal cancer Neg Hx   . Liver cancer Neg Hx     Social History Social History   Tobacco Use  . Smoking status: Former Smoker    Packs/day: 1.00    Years: 2.00    Pack years: 2.00    Last attempt to quit: 11/24/1959    Years since quitting: 58.6  . Smokeless tobacco: Never Used  Substance Use Topics  . Alcohol use: No  . Drug use: No    Review of Systems  Constitutional: No fever/chills Eyes: No visual changes. ENT: No sore throat. Cardiovascular: Denies chest pain. Respiratory: Denies shortness of breath. Gastrointestinal: No abdominal pain.  No nausea, no vomiting. Positive  diarrhea.  No constipation. Genitourinary: Negative for dysuria. Musculoskeletal: Negative for back pain. Skin: Negative for rash. Neurological: Negative for headaches, focal weakness or numbness.  10-point ROS otherwise negative.  ____________________________________________   PHYSICAL EXAM:  VITAL SIGNS: ED Triage Vitals  Enc Vitals Group     BP 06/28/18 1116 (!) 131/52     Pulse Rate 06/28/18 1116 96     Resp 06/28/18 1116 18     Temp 06/28/18 1116 98.7 F (37.1 C)     Temp Source 06/28/18 1116 Oral     SpO2 06/28/18 1116 100 %     Weight 06/28/18 1117 194 lb (88 kg)     Height 06/28/18 1117 5\' 3"  (1.6 m)     Pain Score 06/28/18 1117 0   Constitutional: Alert and oriented. Well appearing and in no acute distress. Eyes: Conjunctivae are normal.   Head: Atraumatic. Nose: No congestion/rhinnorhea. Mouth/Throat: Mucous membranes are slightly dry.  Neck: No stridor. Cardiovascular: Normal rate, regular rhythm. Good peripheral circulation. Grossly normal heart sounds.   Respiratory: Normal respiratory effort.  No retractions. Lungs CTAB. Gastrointestinal: Soft and nontender. No distention.  Musculoskeletal: No lower extremity tenderness nor edema. No gross deformities of extremities. Neurologic:  Normal speech and language. No gross focal neurologic deficits are appreciated.  Skin:  Skin is warm, dry and intact. No rash noted.  ____________________________________________   LABS (all labs ordered are listed, but only abnormal results are displayed)  Labs Reviewed  COMPREHENSIVE METABOLIC PANEL - Abnormal; Notable for the following components:      Result Value   Glucose, Bld 114 (*)    BUN 25 (*)    Creatinine, Ser 2.53 (*)    Calcium 8.7 (*)    GFR calc non Af Amer 17 (*)    GFR calc Af Amer 20 (*)    All other components within normal limits  CBC WITH DIFFERENTIAL/PLATELET - Abnormal; Notable for the following components:   Monocytes Absolute 1.1 (*)    All other components within normal limits  URINALYSIS, ROUTINE W REFLEX MICROSCOPIC - Abnormal; Notable for the following components:   Specific Gravity, Urine <1.005 (*)    All other components within normal limits  C DIFFICILE QUICK SCREEN W PCR REFLEX  GASTROINTESTINAL PANEL BY PCR, STOOL (REPLACES STOOL CULTURE)  LIPASE, BLOOD   ____________________________________________  RADIOLOGY  Ct Abdomen Pelvis Wo Contrast  Result Date: 06/28/2018 CLINICAL DATA:  Diarrhea over the last 4 days.  Elevated creatinine. EXAM: CT ABDOMEN AND PELVIS WITHOUT CONTRAST TECHNIQUE: Multidetector CT imaging of the abdomen and pelvis was performed following the standard protocol without IV contrast. COMPARISON:  None. FINDINGS: Lower chest: Normal except for a small calcified granuloma  in the right lower lobe. Hepatobiliary: No liver parenchymal abnormality is seen. Previous cholecystectomy. Pancreas: Normal Spleen: Normal Adrenals/Urinary Tract: Adrenal glands are normal. The right kidney is normal. Left kidney is normal except for a 2.7 cm cyst in the lateral midportion. No hydronephrosis. No bladder abnormality seen. Stomach/Bowel: No sign of bowel obstruction. Liquid stool noted within the colon consistent with the clinical history of diarrhea. Sigmoid diverticulosis without imaging evidence of diverticulitis presently. No sign of bowel wall thickening. Vascular/Lymphatic: Aortic atherosclerosis. No aneurysm. IVC is normal. No retroperitoneal adenopathy. Reproductive: Previous hysterectomy.  No pelvic mass. Other: No free fluid or air. Musculoskeletal: Ordinary chronic lower lumbar degenerative changes. IMPRESSION: Liquid stool throughout the colon consistent with the clinical history of diarrhea. No evidence of bowel obstruction or convincing bowel wall edema.  Previous cholecystectomy.  Previous hysterectomy. Aortic atherosclerosis. Left renal cyst. Sigmoid diverticulosis without evidence of diverticulitis. Low level diverticulitis can be inapparent at imaging. Electronically Signed   By: Nelson Chimes M.D.   On: 06/28/2018 14:53    ____________________________________________   PROCEDURES  Procedure(s) performed:   Procedures  None  ____________________________________________   INITIAL IMPRESSION / ASSESSMENT AND PLAN / ED COURSE  Pertinent labs & imaging results that were available during my care of the patient were reviewed by me and considered in my medical decision making (see chart for details).  Resents to the emergency department for evaluation of diarrhea which is been persistent and watery for the past 4 days.  No blood.  Patient does have some mild umbilical abdominal discomfort but no rebound or guarding.  She also tells me that she has had some unintentional  weight loss over the past year but her last colonoscopy was in 2018 and she states was normal at that time.  She was told by gastroneurology at that time she would not need any further colonoscopy or screening.  Given the patient's age and some mild periumbilical discomfort plan for CT abdomen pelvis along with labs, IV fluids.   03:00 PM CT reviewed with no acute findings other than diarrhea. No colitis. With significant AKI and continued symptoms here plan for admit for IVF and follow stool cultures.   Discussed patient's case with Hospitalist to request admission. Patient and family (if present) updated with plan. Care transferred to Hospitalist service.  I reviewed all nursing notes, vitals, pertinent old records, EKGs, labs, imaging (as available).  ____________________________________________  FINAL CLINICAL IMPRESSION(S) / ED DIAGNOSES  Final diagnoses:  AKI (acute kidney injury) ([place])  Dehydration  Diarrhea of presumed infectious origin     MEDICATIONS GIVEN DURING THIS VISIT:  Medications  lactated ringers infusion ( Intravenous New Bag/Given 06/28/18 1514)  sodium chloride 0.9 % bolus 1,000 mL (0 mLs Intravenous Stopped 06/28/18 1315)  iopamidol (ISOVUE-300) 61 % injection 30 mL (30 mLs Oral Contrast Given 06/28/18 1226)    Note:  This document was prepared using Dragon voice recognition software and may include unintentional dictation errors.  Nanda Quinton, MD Emergency Medicine    Shizuo Biskup, [facility], MD 06/28/18 [phone]

## 2018-06-28 NOTE — Telephone Encounter (Signed)
Patient advised to continue imodium and contact PCP or go to urgent care. Patient would prefer to go to an urgent care to evaluate for dehydration possible stool studies.

## 2018-06-28 NOTE — ED Triage Notes (Signed)
PT HAS HAD DIARRHEA SINCE Saturday, she also feels weak.

## 2018-06-28 NOTE — H&P (Signed)
History and Physical    Andrea Brooks BDZ:[id number] DOB: [birth year] DOA: 06/28/2018  PCP: Esaw Grandchild, NP  Patient coming from: home  I have personally briefly reviewed patient's old medical records in [place]  Chief Complaint: diarrhea  HPI: Andrea Brooks is a 82 y.o. female with medical history significant of anxiety, type 2 diabetes, reflux, hypertension who presents with diarrhea since last Saturday.  She notes that her diarrhea started around noon on Saturday.  She describes it as being violent and nonstop.  Sunday she felt generally poorly.  Yesterday she forced fluids all day.  She called her doctors who told her to try Pedialyte and Gatorade.  This morning it was suggested she go to the emergency department due to her persistent symptoms.  She denies any chest pain, shortness of breath, fevers, chills.  She denies any abdominal pain or nausea or vomiting.  She notes a history of diverticulitis and some cramping when she eats certain foods.  She denies any dysuria.  Her husband did notably have diarrhea few weeks ago, but nothing like hers.  She denies any recent travel or antibiotic use.  ED Course: Labs, CT, IVF. Admit for diarrhea and AKI.  Review of Systems: As per HPI otherwise 10 point review of systems negative.   Past Medical History:  Diagnosis Date  . Anxiety   . Colon polyps   . Diabetes mellitus without complication ([place])   . Diverticulosis   . Gallstones   . GERD (gastroesophageal reflux disease)   . Hyperlipidemia   . Hypertension   . Obesity     Past Surgical History:  Procedure Laterality Date  . ABDOMINAL HYSTERECTOMY     total  . BREAST BIOPSY Left    neg  . CHOLECYSTECTOMY    . JOINT REPLACEMENT Bilateral    knee  . REPLACEMENT TOTAL KNEE BILATERAL       reports that she quit smoking about 58 years ago. She has a 2.00 pack-year smoking history. She has never used smokeless tobacco. She reports that she does not drink alcohol or use  drugs.  No Known Allergies  Family History  Problem Relation Age of Onset  . Hypertension Mother   . Colon cancer Mother   . Stroke Father   . Heart failure Father   . Stroke Sister   . Stroke Brother   . Graves' disease Daughter   . Cancer Paternal Aunt        breast  . Breast cancer Paternal Aunt   . Graves' disease Sister   . Bone cancer Sister   . Breast cancer Maternal Aunt   . Esophageal cancer Neg Hx   . Rectal cancer Neg Hx   . Liver cancer Neg Hx    Prior to Admission medications   Medication Sig Start Date End Date Taking? Authorizing Provider  aspirin EC 81 MG tablet Take 81 mg by mouth daily.   Yes [provider]  ezetimibe-simvastatin (VYTORIN) 10-20 MG tablet Take 1 tablet by mouth daily.   Yes [provider]  LORazepam (ATIVAN) 1 MG tablet Take 1 tablet (1 mg total) by mouth daily as needed for anxiety. 06/15/18  Yes Danford, [name] D, NP  metFORMIN (GLUCOPHAGE) 500 MG tablet TAKE 1 TABLET TWICE A DAY WITH MEALS 05/02/18  Yes Danford, Katy D, NP  Omeprazole (PRILOSEC PO) Take 2.5 mg by mouth daily.   Yes [provider]  valsartan (DIOVAN) 160 MG tablet Take 1 tablet (160 mg total)  by mouth daily. PATIENT MUST HAVE OFFICE VISIT PRIOR TO ANY FURTHER REFILLS 03/17/18  Yes Danford, Berna Spare, NP  dicyclomine (BENTYL) 10 MG capsule Take 1 tab by mouth 30 min before a meal  as needed for cramping and diarrhea. 07/13/17   Alfredia Ferguson, PA-C    Physical Exam: Vitals:   06/28/18 1116 06/28/18 1117 06/28/18 1411 06/28/18 1515  BP: (!) 131/52  (!) 126/50 123/62  Pulse: 96  76 77  Resp: 18  18   Temp: 98.7 F (37.1 C)     TempSrc: Oral     SpO2: 100%  97% 98%  Weight:  88 kg (194 lb)    Height:  5\' 3"  (1.6 m)      Constitutional: NAD, calm, comfortable Vitals:   06/28/18 1116 06/28/18 1117 06/28/18 1411 06/28/18 1515  BP: (!) 131/52  (!) 126/50 123/62  Pulse: 96  76 77  Resp: 18  18   Temp: 98.7 F (37.1 C)     TempSrc: Oral       SpO2: 100%  97% 98%  Weight:  88 kg (194 lb)    Height:  5\' 3"  (1.6 m)     Eyes: PERRL, lids and conjunctivae normal ENMT: Mucous membranes are moist. Posterior pharynx clear of any exudate or lesions.Normal dentition.  Neck: normal, supple, no masses, no thyromegaly Respiratory: clear to auscultation bilaterally, no wheezing, no crackles. Normal respiratory effort. No accessory muscle use.  Cardiovascular: Regular rate and rhythm, no murmurs / rubs / gallops. No extremity edema. 2+ pedal pulses. No carotid bruits.  Abdomen: no tenderness, no masses palpated. No hepatosplenomegaly. Bowel sounds positive.  Musculoskeletal: no clubbing / cyanosis. No joint deformity upper and lower extremities. Good ROM, no contractures. Normal muscle tone.  Skin: no rashes, lesions, ulcers. No induration Neurologic: CN 2-12 grossly intact. Sensation intact. Strength 5/5 in all 4.  Psychiatric: Normal judgment and insight. Alert and oriented x 3. Normal mood.   Labs on Admission: I have personally reviewed following labs and imaging studies  CBC: Recent Labs  Lab 06/28/18 1134 06/28/18 1745  WBC 5.6 5.4  NEUTROABS 3.4  --   HGB 13.1 11.6*  HCT 39.3 35.5*  MCV 86.9 88.1  PLT 172 443   Basic Metabolic Panel: Recent Labs  Lab 06/28/18 1134 06/28/18 1745  NA 138  --   K 3.9  --   CL 105  --   CO2 22  --   GLUCOSE 114*  --   BUN 25*  --   CREATININE 2.53* 2.54*  CALCIUM 8.7*  --    GFR: Estimated Creatinine Clearance: 18.9 mL/min (A) (by C-G formula based on SCr of 2.54 mg/dL (H)). Liver Function Tests: Recent Labs  Lab 06/28/18 1134  AST 29  ALT 20  ALKPHOS 61  BILITOT 0.5  PROT 7.5  ALBUMIN 3.6   Recent Labs  Lab 06/28/18 1134  LIPASE 29   No results for input(s): AMMONIA in the last 168 hours. Coagulation Profile: No results for input(s): INR, PROTIME in the last 168 hours. Cardiac Enzymes: No results for input(s): CKTOTAL, CKMB, CKMBINDEX, TROPONINI in the last 168  hours. BNP (last 3 results) No results for input(s): PROBNP in the last 8760 hours. HbA1C: No results for input(s): HGBA1C in the last 72 hours. CBG: No results for input(s): GLUCAP in the last 168 hours. Lipid Profile: No results for input(s): CHOL, HDL, LDLCALC, TRIG, CHOLHDL, LDLDIRECT in the last 72 hours. Thyroid Function Tests: No  results for input(s): TSH, T4TOTAL, FREET4, T3FREE, THYROIDAB in the last 72 hours. Anemia Panel: No results for input(s): VITAMINB12, FOLATE, FERRITIN, TIBC, IRON, RETICCTPCT in the last 72 hours. Urine analysis:    Component Value Date/Time   COLORURINE YELLOW 06/28/2018 [place] 06/28/2018 1340   LABSPEC <1.005 (L) 06/28/2018 1340   PHURINE 6.0 06/28/2018 1340   GLUCOSEU NEGATIVE 06/28/2018 1340   HGBUR NEGATIVE 06/28/2018 1340   BILIRUBINUR NEGATIVE 06/28/2018 1340   KETONESUR NEGATIVE 06/28/2018 1340   PROTEINUR NEGATIVE 06/28/2018 1340   NITRITE NEGATIVE 06/28/2018 1340   LEUKOCYTESUR NEGATIVE 06/28/2018 1340    Radiological Exams on Admission: Ct Abdomen Pelvis Wo Contrast  Result Date: 06/28/2018 CLINICAL DATA:  Diarrhea over the last 4 days.  Elevated creatinine. EXAM: CT ABDOMEN AND PELVIS WITHOUT CONTRAST TECHNIQUE: Multidetector CT imaging of the abdomen and pelvis was performed following the standard protocol without IV contrast. COMPARISON:  None. FINDINGS: Lower chest: Normal except for a small calcified granuloma in the right lower lobe. Hepatobiliary: No liver parenchymal abnormality is seen. Previous cholecystectomy. Pancreas: Normal Spleen: Normal Adrenals/Urinary Tract: Adrenal glands are normal. The right kidney is normal. Left kidney is normal except for a 2.7 cm cyst in the lateral midportion. No hydronephrosis. No bladder abnormality seen. Stomach/Bowel: No sign of bowel obstruction. Liquid stool noted within the colon consistent with the clinical history of diarrhea. Sigmoid diverticulosis without imaging  evidence of diverticulitis presently. No sign of bowel wall thickening. Vascular/Lymphatic: Aortic atherosclerosis. No aneurysm. IVC is normal. No retroperitoneal adenopathy. Reproductive: Previous hysterectomy.  No pelvic mass. Other: No free fluid or air. Musculoskeletal: Ordinary chronic lower lumbar degenerative changes. IMPRESSION: Liquid stool throughout the colon consistent with the clinical history of diarrhea. No evidence of bowel obstruction or convincing bowel wall edema. Previous cholecystectomy.  Previous hysterectomy. Aortic atherosclerosis. Left renal cyst. Sigmoid diverticulosis without evidence of diverticulitis. Low level diverticulitis can be inapparent at imaging. Electronically Signed   By: Nelson Chimes M.D.   On: 06/28/2018 14:53    EKG: Independently reviewed. none  Assessment/Plan Active Problems:   Essential hypertension   Diabetes mellitus without complication (H[facility])   AKI (acute kidney injury) ([place])   Watery diarrhea  Diarrhea: Patient has had diarrhea since Saturday.  Follow C. difficile and GI pathogen panel.  Suspect infectious gastroenteritis. Soft diet Maintenance IV fluids GI pathogen panel and C. difficile pending CT abdomen pelvis with liquid stool throughout the colon without evidence of obstruction or bowel wall edema  Acute kidney injury Baseline creatinine less than 1, elevated to 2.53 today in the setting of diarrhea above Likely secondary to dehydration from above Urine is bland Hold ARB and follow with IV fluids  Hypertension Hold ARB  Type 2 diabetes Hold metformin Sliding scale insulin  Hyperlipidemia: Continue home meds, continue aspirin for primary prevention  Anxiety: Continue Ativan as needed  GERD: Continue PPI  DVT prophylaxis: heparin  Code Status: full  Family Communication: none at bedside  Disposition Plan: pending Consults called: none  Admission status: inpatient   Fayrene Helper MD [facility] Pager  [phone]  If 7PM-7AM, please contact night-coverage [email or web address] Password [facility]  06/28/2018, 6:41 PM

## 2018-06-28 NOTE — Progress Notes (Addendum)
82 year old female with past medical history of diabetes mellitus without complications, diverticulosis, essential hypertension with no recent exposure to antibiotics, has not been recently admitted to the hospital, no recent travel history that comes into the med center [place] for 4 days of watery diarrhea and abdominal cramping.  As per patient her husband had a something similar 1 to 2 weeks prior but nothing as severe. She has not been able to keep solid foods down she denies any nausea or vomiting, but has been able to drink fluids.  We will admit her to MedSurg floor with AKI. C. difficile PCR and viral panel have been sent by the ED physician

## 2018-06-29 DIAGNOSIS — E119 Type 2 diabetes mellitus without complications: Secondary | ICD-10-CM

## 2018-06-29 DIAGNOSIS — I10 Essential (primary) hypertension: Secondary | ICD-10-CM

## 2018-06-29 DIAGNOSIS — K580 Irritable bowel syndrome with diarrhea: Secondary | ICD-10-CM

## 2018-06-29 LAB — GASTROINTESTINAL PANEL BY PCR, STOOL (REPLACES STOOL CULTURE)
Adenovirus F40/41: NOT DETECTED
Astrovirus: NOT DETECTED
CRYPTOSPORIDIUM: NOT DETECTED
CYCLOSPORA CAYETANENSIS: NOT DETECTED
Campylobacter species: NOT DETECTED
ENTAMOEBA HISTOLYTICA: NOT DETECTED
Enteroaggregative E coli (EAEC): NOT DETECTED
Enteropathogenic E coli (EPEC): DETECTED — AB
Enterotoxigenic E coli (ETEC): NOT DETECTED
Giardia lamblia: NOT DETECTED
NOROVIRUS GI/GII: NOT DETECTED
Plesimonas shigelloides: NOT DETECTED
Rotavirus A: NOT DETECTED
SALMONELLA SPECIES: NOT DETECTED
SAPOVIRUS (I, II, IV, AND V): NOT DETECTED
SHIGELLA/ENTEROINVASIVE E COLI (EIEC): NOT DETECTED
Shiga like toxin producing E coli (STEC): NOT DETECTED
VIBRIO CHOLERAE: NOT DETECTED
VIBRIO SPECIES: NOT DETECTED
YERSINIA ENTEROCOLITICA: NOT DETECTED

## 2018-06-29 LAB — MAGNESIUM: Magnesium: 1.6 mg/dL — ABNORMAL LOW (ref 1.7–2.4)

## 2018-06-29 LAB — COMPREHENSIVE METABOLIC PANEL
ALK PHOS: 56 U/L (ref 38–126)
ALT: 17 U/L (ref 0–44)
AST: 23 U/L (ref 15–41)
Albumin: 3.1 g/dL — ABNORMAL LOW (ref 3.5–5.0)
Anion gap: 9 (ref 5–15)
BILIRUBIN TOTAL: 0.7 mg/dL (ref 0.3–1.2)
BUN: 26 mg/dL — AB (ref 8–23)
CO2: 22 mmol/L (ref 22–32)
CREATININE: 2.32 mg/dL — AB (ref 0.44–1.00)
Calcium: 8.8 mg/dL — ABNORMAL LOW (ref 8.9–10.3)
Chloride: 110 mmol/L (ref 98–111)
GFR calc Af Amer: 22 mL/min — ABNORMAL LOW (ref >60)
GFR calc non Af Amer: 19 mL/min — ABNORMAL LOW (ref >60)
GLUCOSE: 124 mg/dL — AB (ref 70–99)
Potassium: 4 mmol/L (ref 3.5–5.1)
Sodium: 141 mmol/L (ref 135–145)
TOTAL PROTEIN: 6.3 g/dL — AB (ref 6.5–8.1)

## 2018-06-29 LAB — GLUCOSE, CAPILLARY
GLUCOSE-CAPILLARY: 102 mg/dL — AB (ref 70–99)
GLUCOSE-CAPILLARY: 131 mg/dL — AB (ref 70–99)
GLUCOSE-CAPILLARY: 114 mg/dL — AB (ref 70–99)
GLUCOSE-CAPILLARY: 117 mg/dL — AB (ref 70–99)
Glucose-Capillary: 127 mg/dL — ABNORMAL HIGH (ref 70–99)
Glucose-Capillary: 137 mg/dL — ABNORMAL HIGH (ref 70–99)

## 2018-06-29 LAB — CBC
HCT: 35.8 % — ABNORMAL LOW (ref 36.0–46.0)
Hemoglobin: 11.8 g/dL — ABNORMAL LOW (ref 12.0–15.0)
MCH: 28.8 pg (ref 26.0–34.0)
MCHC: 33 g/dL (ref 30.0–36.0)
MCV: 87.3 fL (ref 78.0–100.0)
PLATELETS: 167 10*3/uL (ref 150–400)
RBC: 4.1 MIL/uL (ref 3.87–5.11)
RDW: 14.4 % (ref 11.5–15.5)
WBC: 4.9 10*3/uL (ref 4.0–10.5)

## 2018-06-29 MED ORDER — SODIUM CHLORIDE 0.45 % IV SOLN
INTRAVENOUS | Status: DC
  Administered 2018-06-29 – 2018-07-01 (×5): via INTRAVENOUS

## 2018-06-29 MED ORDER — DICYCLOMINE HCL 10 MG PO CAPS
10.0000 mg | ORAL_CAPSULE | Freq: Three times a day (TID) | ORAL | Status: DC | PRN
  Administered 2018-07-01: 10 mg via ORAL
  Filled 2018-06-29 (×3): qty 1

## 2018-06-29 MED ORDER — ONDANSETRON HCL 4 MG/2ML IJ SOLN
4.0000 mg | Freq: Four times a day (QID) | INTRAMUSCULAR | Status: DC | PRN
  Administered 2018-06-29: 4 mg via INTRAVENOUS
  Filled 2018-06-29: qty 2

## 2018-06-29 MED ORDER — SODIUM CHLORIDE 0.9 % IV SOLN
500.0000 mg | INTRAVENOUS | Status: AC
  Administered 2018-06-29 – 2018-07-01 (×3): 500 mg via INTRAVENOUS
  Filled 2018-06-29 (×3): qty 500

## 2018-06-29 MED ORDER — MAGNESIUM SULFATE 2 GM/50ML IV SOLN
2.0000 g | Freq: Once | INTRAVENOUS | Status: AC
  Administered 2018-06-29: 2 g via INTRAVENOUS
  Filled 2018-06-29: qty 50

## 2018-06-29 MED ORDER — ZOLPIDEM TARTRATE 5 MG PO TABS
5.0000 mg | ORAL_TABLET | Freq: Every evening | ORAL | Status: DC | PRN
Start: 2018-06-29 — End: 2018-07-02
  Administered 2018-06-29 – 2018-07-01 (×3): 5 mg via ORAL
  Filled 2018-06-29 (×3): qty 1

## 2018-06-29 NOTE — Progress Notes (Signed)
Nutrition Brief Note  Patient identified on the Malnutrition Screening Tool (MST) Report  Patient consumed 100% of breakfast this morning providing ~860 kcal and 14g protein. Weights are stable x 1 year.  Wt Readings from Last 15 Encounters:  06/29/18 194 lb 3.6 oz (88.1 kg)  06/15/18 194 lb 3.2 oz (88.1 kg)  03/30/18 195 lb 3.2 oz (88.5 kg)  03/17/18 203 lb 14.4 oz (92.5 kg)  11/04/17 200 lb 14.4 oz (91.1 kg)  08/10/17 199 lb (90.3 kg)  07/13/17 199 lb 8 oz (90.5 kg)  07/06/17 197 lb 1.6 oz (89.4 kg)  06/24/17 197 lb 3.2 oz (89.4 kg)  04/22/17 206 lb 8 oz (93.7 kg)    Body mass index is 34.41 kg/m. Patient meets criteria for obesity based on current BMI.   Current diet order is soft, patient is consuming approximately 100% of meals at this time. Labs and medications reviewed.   No nutrition interventions warranted at this time. If nutrition issues arise, please consult RD.   Clayton Bibles, MS, RD, [place] Dietitian Pager: [phone] After Hours Pager: [phone]

## 2018-06-29 NOTE — Progress Notes (Signed)
PROGRESS NOTE  Andrea Brooks ZSW:[id number] DOB: 07/31/1936 DOA: 06/28/2018 PCP: Esaw Grandchild, NP  HPI/Recap of past 23 hours: 82 year old female with medical history significant for type 2 diabetes, anxiety, GERD, hypertension, IBS presented to the ER with worsening diarrhea for the past 5 days with multiple, watery, non-bloody episodes per day.  Denies any significant abdominal pain/nausea/vomiting.  Reported husband had diarrhea a few weeks ago but resolved spontaneously and was not as bad as has.  Patient denies any recent travel or any antibiotic use.  Patient has been following with [facility] and has been diagnosed with irritable bowel syndrome (reports alternating diarrhea with constipation).  In the ED, patient noted to have significant AKI.  Patient admitted for further management.   Today, patient reported diarrhea has slowed down, had only one episode last night none since this a.m.  Denies any abdominal pain, nausea/vomiting, chest pain, fever/chills.  Assessment/Plan: Active Problems:   Essential hypertension   Diabetes mellitus without complication (HCC)   AKI (acute kidney injury) ([place])   Watery diarrhea  Diarrhea likely 2/2 IBS flare, r/o infectious etiology Vs diverticulitis Afebrile, no leukocytosis History of irritable bowel syndrome (reports alternating episodes of diarrhea with constipation). Follows with [facility] for IBS C. difficile negative, stool panel pending CT abdomen/pelvis showed liquid stool throughout the colon consistent with the clinical history of diarrhea, no evidence of bowel obstruction.  Sigmoid diverticulosis without evidence of diverticulitis PO Bentyl prn Continue IV fluids  AKI Likely due to above--> dehydration Normal baseline creatinine, 2.53 on admission Continue IV fluids Continue to hold ARB  Hypertension BP somewhat soft Hold ARB  Type 2 diabetes Last A1c 6.3 SSI, Accu-Cheks, hypoglycemic protocol Hold home metformin due to  AKI  Anxiety Continue Ativan as needed  GERD Continue PPI  Hyperlipidemia Continue home meds     Code Status: Full  Family Communication: None at bedside  Disposition Plan: Home once creatinine significantly improves   Consultants:  None  Procedures:  None  Antimicrobials:  None  DVT prophylaxis: Heparin   Objective: Vitals:   06/28/18 1515 06/28/18 1719 06/28/18 2046 06/29/18 0526  BP: 123/62 (!) 151/61 (!) 133/50 (!) 128/57  Pulse: 77 70 71 69  Resp:   19 19  Temp:   98.2 F (36.8 C) 98.1 F (36.7 C)  TempSrc:   Oral Oral  SpO2: 98% 97% 100% 98%  Weight:    88.1 kg (194 lb 3.6 oz)  Height:        Intake/Output Summary (Last 24 hours) at 06/29/2018 1322 Last data filed at 06/29/2018 0800 Gross per 24 hour  Intake 1458.73 ml  Output -  Net 1458.73 ml   Filed Weights   06/28/18 1117 06/29/18 0526  Weight: 88 kg (194 lb) 88.1 kg (194 lb 3.6 oz)    Exam:   General: NAD  Cardiovascular: S1, S2 present  Respiratory: CTA B  Abdomen: Soft, nontender, nondistended, bowel sounds present  Musculoskeletal: No pedal edema bilaterally  Skin: Normal  Psychiatry: Normal mood   Data Reviewed: CBC: Recent Labs  Lab 06/28/18 1134 06/28/18 1745 06/29/18 0613  WBC 5.6 5.4 4.9  NEUTROABS 3.4  --   --   HGB 13.1 11.6* 11.8*  HCT 39.3 35.5* 35.8*  MCV 86.9 88.1 87.3  PLT 172 173 322   Basic Metabolic Panel: Recent Labs  Lab 06/28/18 1134 06/28/18 1745 06/29/18 0613  NA 138  --  141  K 3.9  --  4.0  CL 105  --  110  CO2 22  --  22  GLUCOSE 114*  --  124*  BUN 25*  --  26*  CREATININE 2.53* 2.54* 2.32*  CALCIUM 8.7*  --  8.8*  MG  --   --  1.6*   GFR: Estimated Creatinine Clearance: 20.7 mL/min (A) (by C-G formula based on SCr of 2.32 mg/dL (H)). Liver Function Tests: Recent Labs  Lab 06/28/18 1134 06/29/18 0613  AST 29 23  ALT 20 17  ALKPHOS 61 56  BILITOT 0.5 0.7  PROT 7.5 6.3*  ALBUMIN 3.6 3.1*   Recent Labs  Lab  06/28/18 1134  LIPASE 29   No results for input(s): AMMONIA in the last 168 hours. Coagulation Profile: No results for input(s): INR, PROTIME in the last 168 hours. Cardiac Enzymes: No results for input(s): CKTOTAL, CKMB, CKMBINDEX, TROPONINI in the last 168 hours. BNP (last 3 results) No results for input(s): PROBNP in the last 8760 hours. HbA1C: Recent Labs    06/28/18 1745  HGBA1C 6.3*   CBG: No results for input(s): GLUCAP in the last 168 hours. Lipid Profile: No results for input(s): CHOL, HDL, LDLCALC, TRIG, CHOLHDL, LDLDIRECT in the last 72 hours. Thyroid Function Tests: No results for input(s): TSH, T4TOTAL, FREET4, T3FREE, THYROIDAB in the last 72 hours. Anemia Panel: No results for input(s): VITAMINB12, FOLATE, FERRITIN, TIBC, IRON, RETICCTPCT in the last 72 hours. Urine analysis:    Component Value Date/Time   COLORURINE YELLOW 06/28/2018 1340   APPEARANCEUR CLEAR 06/28/2018 1340   LABSPEC <1.005 (L) 06/28/2018 1340   PHURINE 6.0 06/28/2018 1340   GLUCOSEU NEGATIVE 06/28/2018 1340   HGBUR NEGATIVE 06/28/2018 1340   BILIRUBINUR NEGATIVE 06/28/2018 1340   KETONESUR NEGATIVE 06/28/2018 1340   PROTEINUR NEGATIVE 06/28/2018 1340   NITRITE NEGATIVE 06/28/2018 1340   LEUKOCYTESUR NEGATIVE 06/28/2018 1340   Sepsis Labs: @LABRCNTIP (procalcitonin:4,lacticidven:4)  ) Recent Results (from the past 240 hour(s))  C difficile quick scan w PCR reflex     Status: None   Collection Time: 06/28/18  3:35 PM  Result Value Ref Range Status   C Diff antigen NEGATIVE NEGATIVE Final   C Diff toxin NEGATIVE NEGATIVE Final   C Diff interpretation No C. difficile detected.  Final    Comment: Performed at [facility], [place] [address], [place], [place] [address]      Studies: Ct Abdomen Pelvis Wo Contrast  Result Date: 06/28/2018 CLINICAL DATA:  Diarrhea over the last 4 days.  Elevated creatinine. EXAM: CT ABDOMEN AND PELVIS WITHOUT CONTRAST TECHNIQUE: Multidetector CT  imaging of the abdomen and pelvis was performed following the standard protocol without IV contrast. COMPARISON:  None. FINDINGS: Lower chest: Normal except for a small calcified granuloma in the right lower lobe. Hepatobiliary: No liver parenchymal abnormality is seen. Previous cholecystectomy. Pancreas: Normal Spleen: Normal Adrenals/Urinary Tract: Adrenal glands are normal. The right kidney is normal. Left kidney is normal except for a 2.7 cm cyst in the lateral midportion. No hydronephrosis. No bladder abnormality seen. Stomach/Bowel: No sign of bowel obstruction. Liquid stool noted within the colon consistent with the clinical history of diarrhea. Sigmoid diverticulosis without imaging evidence of diverticulitis presently. No sign of bowel wall thickening. Vascular/Lymphatic: Aortic atherosclerosis. No aneurysm. IVC is normal. No retroperitoneal adenopathy. Reproductive: Previous hysterectomy.  No pelvic mass. Other: No free fluid or air. Musculoskeletal: Ordinary chronic lower lumbar degenerative changes. IMPRESSION: Liquid stool throughout the colon consistent with the clinical history of diarrhea. No evidence of bowel obstruction or convincing bowel wall  edema. Previous cholecystectomy.  Previous hysterectomy. Aortic atherosclerosis. Left renal cyst. Sigmoid diverticulosis without evidence of diverticulitis. Low level diverticulitis can be inapparent at imaging. Electronically Signed   By: Nelson Chimes M.D.   On: 06/28/2018 14:53    Scheduled Meds: . aspirin EC  81 mg Oral Daily  . ezetimibe-simvastatin  1 tablet Oral Daily  . heparin  5,000 Units Subcutaneous Q8H  . insulin aspart  0-5 Units Subcutaneous QHS  . insulin aspart  0-9 Units Subcutaneous TID WC  . pantoprazole  20 mg Oral Daily    Continuous Infusions: . sodium chloride 125 mL/hr at 06/29/18 1026     LOS: 1 day     Alma Friendly, MD [facility]   If 7PM-7AM, please contact  night-coverage [email or web address] Password [email or web address] 06/29/2018, 1:22 PM

## 2018-06-30 LAB — CBC WITH DIFFERENTIAL/PLATELET
BASOS PCT: 0 %
Basophils Absolute: 0 10*3/uL (ref 0.0–0.1)
EOS ABS: 0 10*3/uL (ref 0.0–0.7)
Eosinophils Relative: 1 %
HCT: 31.6 % — ABNORMAL LOW (ref 36.0–46.0)
Hemoglobin: 10.6 g/dL — ABNORMAL LOW (ref 12.0–15.0)
Lymphocytes Relative: 17 %
Lymphs Abs: 1.2 10*3/uL (ref 0.7–4.0)
MCH: 29 pg (ref 26.0–34.0)
MCHC: 33.5 g/dL (ref 30.0–36.0)
MCV: 86.6 fL (ref 78.0–100.0)
MONO ABS: 0.6 10*3/uL (ref 0.1–1.0)
MONOS PCT: 9 %
Neutro Abs: 5.1 10*3/uL (ref 1.7–7.7)
Neutrophils Relative %: 73 %
PLATELETS: 189 10*3/uL (ref 150–400)
RBC: 3.65 MIL/uL — ABNORMAL LOW (ref 3.87–5.11)
RDW: 14.3 % (ref 11.5–15.5)
WBC: 7 10*3/uL (ref 4.0–10.5)

## 2018-06-30 LAB — BASIC METABOLIC PANEL
Anion gap: 8 (ref 5–15)
BUN: 24 mg/dL — ABNORMAL HIGH (ref 8–23)
CALCIUM: 8.4 mg/dL — AB (ref 8.9–10.3)
CO2: 22 mmol/L (ref 22–32)
CREATININE: 1.74 mg/dL — AB (ref 0.44–1.00)
Chloride: 109 mmol/L (ref 98–111)
GFR, EST AFRICAN AMERICAN: 31 mL/min — AB (ref >60)
GFR, EST NON AFRICAN AMERICAN: 27 mL/min — AB (ref >60)
Glucose, Bld: 107 mg/dL — ABNORMAL HIGH (ref 70–99)
Potassium: 4 mmol/L (ref 3.5–5.1)
SODIUM: 139 mmol/L (ref 135–145)

## 2018-06-30 LAB — GLUCOSE, CAPILLARY
GLUCOSE-CAPILLARY: 78 mg/dL (ref 70–99)
Glucose-Capillary: 104 mg/dL — ABNORMAL HIGH (ref 70–99)
Glucose-Capillary: 119 mg/dL — ABNORMAL HIGH (ref 70–99)
Glucose-Capillary: 134 mg/dL — ABNORMAL HIGH (ref 70–99)

## 2018-06-30 LAB — MAGNESIUM: MAGNESIUM: 1.9 mg/dL (ref 1.7–2.4)

## 2018-06-30 NOTE — Progress Notes (Signed)
PROGRESS NOTE  Andrea Brooks QIW:[id number] DOB: 1936/09/03 DOA: 06/28/2018 PCP: Esaw Grandchild, NP  HPI/Recap of past 72 hours: 82 year old female with medical history significant for type 2 diabetes, anxiety, GERD, hypertension, IBS presented to the ER with worsening diarrhea for the past 5 days with multiple, watery, non-bloody episodes per day.  Denies any significant abdominal pain/nausea/vomiting.  Reported husband had diarrhea a few weeks ago but resolved spontaneously and was not as bad as has.  Patient denies any recent travel or any antibiotic use.  Patient has been following with [facility] and has been diagnosed with irritable bowel syndrome (reports alternating diarrhea with constipation).  In the ED, patient noted to have significant AKI.  Patient admitted for further management.   Today, patient reported significant multiple episodes of diarrhea overnight. Had an episode of vomiting. Denies any abdominal pain, chest pain, fever/chills.  Assessment/Plan: Active Problems:   Essential hypertension   Diabetes mellitus without complication (HCC)   AKI (acute kidney injury) ([place])   Watery diarrhea  Acute gastroenteritis 2/2 Enteropathogenic E.coli Still with ongoing diarrhea Afebrile, no leukocytosis History of irritable bowel syndrome (reports alternating episodes of diarrhea with constipation). Follows with [facility] for IBS C. difficile negative, stool panel revealed E.coli CT abdomen/pelvis showed liquid stool throughout the colon consistent with the clinical history of diarrhea, no evidence of bowel obstruction.  Sigmoid diverticulosis without evidence of diverticulitis Started on IV Azithromycin for 3 doses Continue IV fluids  AKI Improving Likely due to above--> dehydration Normal baseline creatinine, 2.53 on admission Continue IV fluids Continue to hold ARB  Hypertension BP somewhat soft Hold ARB  Type 2 diabetes Last A1c 6.3 SSI, Accu-Cheks, hypoglycemic  protocol Hold home metformin due to AKI  Anxiety Continue Ativan as needed  GERD Continue PPI  Hyperlipidemia Continue home meds     Code Status: Full  Family Communication: None at bedside  Disposition Plan: Home once creatinine significantly improves and diarrhea somewhat slows down   Consultants:  None  Procedures:  None  Antimicrobials:  Azithromycin  DVT prophylaxis: Heparin   Objective: Vitals:   06/29/18 1434 06/29/18 2019 06/30/18 0535 06/30/18 1431  BP: (!) 125/57 (!) 147/64 (!) 123/55 (!) 135/57  Pulse: 66 79 75 68  Resp:  19 18   Temp:  98.2 F (36.8 C) 99.1 F (37.3 C) 97.7 F (36.5 C)  TempSrc:    Oral  SpO2: 98% 98% 98% 100%  Weight:      Height:        Intake/Output Summary (Last 24 hours) at 06/30/2018 1742 Last data filed at 06/30/2018 0900 Gross per 24 hour  Intake 1660 ml  Output -  Net 1660 ml   Filed Weights   06/28/18 1117 06/29/18 0526  Weight: 88 kg 88.1 kg    Exam:   General: NAD  Cardiovascular: S1, S2 present  Respiratory: CTA B  Abdomen: Soft, nontender, nondistended, bowel sounds present  Musculoskeletal: No pedal edema bilaterally  Skin: Normal  Psychiatry: Normal mood   Data Reviewed: CBC: Recent Labs  Lab 06/28/18 1134 06/28/18 1745 06/29/18 0613 06/30/18 0548  WBC 5.6 5.4 4.9 7.0  NEUTROABS 3.4  --   --  5.1  HGB 13.1 11.6* 11.8* 10.6*  HCT 39.3 35.5* 35.8* 31.6*  MCV 86.9 88.1 87.3 86.6  PLT 172 173 167 417   Basic Metabolic Panel: Recent Labs  Lab 06/28/18 1134 06/28/18 1745 06/29/18 0613 06/30/18 0548  NA 138  --  141 139  K  3.9  --  4.0 4.0  CL 105  --  110 109  CO2 22  --  22 22  GLUCOSE 114*  --  124* 107*  BUN 25*  --  26* 24*  CREATININE 2.53* 2.54* 2.32* 1.74*  CALCIUM 8.7*  --  8.8* 8.4*  MG  --   --  1.6* 1.9   GFR: Estimated Creatinine Clearance: 27.6 mL/min (A) (by C-G formula based on SCr of 1.74 mg/dL (H)). Liver Function Tests: Recent Labs  Lab  06/28/18 1134 06/29/18 0613  AST 29 23  ALT 20 17  ALKPHOS 61 56  BILITOT 0.5 0.7  PROT 7.5 6.3*  ALBUMIN 3.6 3.1*   Recent Labs  Lab 06/28/18 1134  LIPASE 29   No results for input(s): AMMONIA in the last 168 hours. Coagulation Profile: No results for input(s): INR, PROTIME in the last 168 hours. Cardiac Enzymes: No results for input(s): CKTOTAL, CKMB, CKMBINDEX, TROPONINI in the last 168 hours. BNP (last 3 results) No results for input(s): PROBNP in the last 8760 hours. HbA1C: Recent Labs    06/28/18 1745  HGBA1C 6.3*   CBG: Recent Labs  Lab 06/29/18 1106 06/29/18 1639 06/29/18 2021 06/30/18 0753 06/30/18 1115  GLUCAP 127* 137* 117* 78 104*   Lipid Profile: No results for input(s): CHOL, HDL, LDLCALC, TRIG, CHOLHDL, LDLDIRECT in the last 72 hours. Thyroid Function Tests: No results for input(s): TSH, T4TOTAL, FREET4, T3FREE, THYROIDAB in the last 72 hours. Anemia Panel: No results for input(s): VITAMINB12, FOLATE, FERRITIN, TIBC, IRON, RETICCTPCT in the last 72 hours. Urine analysis:    Component Value Date/Time   COLORURINE YELLOW 06/28/2018 1340   APPEARANCEUR CLEAR 06/28/2018 1340   LABSPEC <1.005 (L) 06/28/2018 1340   PHURINE 6.0 06/28/2018 1340   GLUCOSEU NEGATIVE 06/28/2018 1340   HGBUR NEGATIVE 06/28/2018 1340   BILIRUBINUR NEGATIVE 06/28/2018 1340   KETONESUR NEGATIVE 06/28/2018 1340   PROTEINUR NEGATIVE 06/28/2018 1340   NITRITE NEGATIVE 06/28/2018 1340   LEUKOCYTESUR NEGATIVE 06/28/2018 1340   Sepsis Labs: @LABRCNTIP (procalcitonin:4,lacticidven:4)  ) Recent Results (from the past 240 hour(s))  C difficile quick scan w PCR reflex     Status: None   Collection Time: 06/28/18  3:35 PM  Result Value Ref Range Status   C Diff antigen NEGATIVE NEGATIVE Final   C Diff toxin NEGATIVE NEGATIVE Final   C Diff interpretation No C. difficile detected.  Final    Comment: Performed at [facility], [place] [address] [place], [place] [address]   Gastrointestinal Panel by PCR , Stool     Status: Abnormal   Collection Time: 06/28/18  3:35 PM  Result Value Ref Range Status   Campylobacter species NOT DETECTED NOT DETECTED Final   Plesimonas shigelloides NOT DETECTED NOT DETECTED Final   Salmonella species NOT DETECTED NOT DETECTED Final   Yersinia enterocolitica NOT DETECTED NOT DETECTED Final   Vibrio species NOT DETECTED NOT DETECTED Final   Vibrio cholerae NOT DETECTED NOT DETECTED Final   Enteroaggregative E coli (EAEC) NOT DETECTED NOT DETECTED Final   Enteropathogenic E coli (EPEC) DETECTED (A) NOT DETECTED Final    Comment: RESULT CALLED TO, READ BACK BY AND VERIFIED WITH: THEHAITSE GEBRU @2041  06/29/18 AKT    Enterotoxigenic E coli (ETEC) NOT DETECTED NOT DETECTED Final   Shiga like toxin producing E coli (STEC) NOT DETECTED NOT DETECTED Final   Shigella/Enteroinvasive E coli (EIEC) NOT DETECTED NOT DETECTED Final   Cryptosporidium NOT DETECTED NOT DETECTED Final  Cyclospora cayetanensis NOT DETECTED NOT DETECTED Final   Entamoeba histolytica NOT DETECTED NOT DETECTED Final   Giardia lamblia NOT DETECTED NOT DETECTED Final   Adenovirus F40/41 NOT DETECTED NOT DETECTED Final   Astrovirus NOT DETECTED NOT DETECTED Final   Norovirus GI/GII NOT DETECTED NOT DETECTED Final   Rotavirus A NOT DETECTED NOT DETECTED Final   Sapovirus (I, II, IV, and V) NOT DETECTED NOT DETECTED Final    Comment: Performed at [facility], [address], [place], [place] [address]      Studies: No results found.  Scheduled Meds: . aspirin EC  81 mg Oral Daily  . ezetimibe-simvastatin  1 tablet Oral Daily  . heparin  5,000 Units Subcutaneous Q8H  . insulin aspart  0-5 Units Subcutaneous QHS  . insulin aspart  0-9 Units Subcutaneous TID WC  . pantoprazole  20 mg Oral Daily    Continuous Infusions: . sodium chloride 100 mL/hr at 06/30/18 1035  . azithromycin 500 mg (06/29/18 2201)     LOS: 2 days     Alma Friendly, MD [facility]   If 7PM-7AM, please contact night-coverage [email or web address] Password [email or web address] 06/30/2018, 5:42 PM

## 2018-06-30 NOTE — Care Management Note (Signed)
Case Management Note  Patient Details  Name: Andrea Brooks MRN: [id number] Date of Birth: 1936-05-29  Subjective/Objective:          Constant diarrhea in patient withonfirmed ibs.  Family hx of husband having same two weeks ago but resolved on its own. Iv flds and iv abx.  Action/Plan: Will follow for hhc needs and progress.  Expected Discharge Date:  (unknown)               Expected Discharge Plan:  Home/Self Care  In-House Referral:     Discharge planning Services  CM Consult  Post Acute Care Choice:    Choice offered to:     DME Arranged:    DME Agency:     HH Arranged:    HH Agency:     Status of Service:  In process, will continue to follow  If discussed at Long Length of Stay Meetings, dates discussed:    Additional Comments:  Leeroy Cha, RN 06/30/2018, 3:50 PM

## 2018-07-01 DIAGNOSIS — A09 Infectious gastroenteritis and colitis, unspecified: Secondary | ICD-10-CM

## 2018-07-01 LAB — BASIC METABOLIC PANEL
Anion gap: 9 (ref 5–15)
BUN: 19 mg/dL (ref 8–23)
CHLORIDE: 111 mmol/L (ref 98–111)
CO2: 22 mmol/L (ref 22–32)
Calcium: 8.2 mg/dL — ABNORMAL LOW (ref 8.9–10.3)
Creatinine, Ser: 1.51 mg/dL — ABNORMAL HIGH (ref 0.44–1.00)
GFR, EST AFRICAN AMERICAN: 37 mL/min — AB (ref >60)
GFR, EST NON AFRICAN AMERICAN: 32 mL/min — AB (ref >60)
Glucose, Bld: 109 mg/dL — ABNORMAL HIGH (ref 70–99)
POTASSIUM: 3.9 mmol/L (ref 3.5–5.1)
SODIUM: 142 mmol/L (ref 135–145)

## 2018-07-01 LAB — GLUCOSE, CAPILLARY
GLUCOSE-CAPILLARY: 120 mg/dL — AB (ref 70–99)
GLUCOSE-CAPILLARY: 160 mg/dL — AB (ref 70–99)
Glucose-Capillary: 118 mg/dL — ABNORMAL HIGH (ref 70–99)
Glucose-Capillary: 116 mg/dL — ABNORMAL HIGH (ref 70–99)

## 2018-07-01 NOTE — Progress Notes (Signed)
PROGRESS NOTE  Andrea Brooks FVC:[id number] DOB: [birth year] DOA: 06/28/2018 PCP: Esaw Grandchild, NP  HPI/Recap of past 28 hours: 82 year old female with medical history significant for type 2 diabetes, anxiety, GERD, hypertension, IBS presented to the ER with worsening diarrhea for the past 5 days with multiple, watery, non-bloody episodes per day.  Denies any significant abdominal pain/nausea/vomiting.  Reported husband had diarrhea a few weeks ago but resolved spontaneously and was not as bad as has.  Patient denies any recent travel or any antibiotic use.  Patient has been following with [facility] and has been diagnosed with irritable bowel syndrome (reports alternating diarrhea with constipation).  In the ED, patient noted to have significant AKI.  Patient admitted for further management.   Today, patient reported feeling better, diarrhea is slowing down. Denies any abdominal pain, N/V, chest pain, fever/chills.  Assessment/Plan: Active Problems:   Essential hypertension   Diabetes mellitus without complication (HCC)   AKI (acute kidney injury) ([place])   Watery diarrhea  Acute gastroenteritis 2/2 Enteropathogenic E.coli Diarrhea resolving Afebrile, no leukocytosis History of irritable bowel syndrome (reports alternating episodes of diarrhea with constipation). Follows with [facility] for IBS C. difficile negative, stool panel revealed E.coli CT abdomen/pelvis showed liquid stool throughout the colon consistent with the clinical history of diarrhea, no evidence of bowel obstruction.  Sigmoid diverticulosis without evidence of diverticulitis Started on IV Azithromycin X 3 doses Continue gentle IV fluids  AKI Improving Likely due to above--> dehydration Normal baseline creatinine, 2.53 on admission Continue IV fluids Continue to hold ARB  Hypertension BP stable Continue to hold ARB  Type 2 diabetes Last A1c 6.3 SSI, Accu-Cheks, hypoglycemic protocol Hold home metformin due  to AKI  Anxiety Continue Ativan as needed  GERD Continue PPI  Hyperlipidemia Continue home meds     Code Status: Full  Family Communication: None at bedside  Disposition Plan: Home likely 07/02/18   Consultants:  None  Procedures:  None  Antimicrobials:  Azithromycin  DVT prophylaxis: Heparin   Objective: Vitals:   06/30/18 1431 07/01/18 0452 07/01/18 0453 07/01/18 1505  BP: (!) 135/57 (!) 136/57  (!) 145/61  Pulse: 68 69  66  Resp:  20  20  Temp: 97.7 F (36.5 C) 98.4 F (36.9 C)  98.1 F (36.7 C)  TempSrc: Oral   Oral  SpO2: 100% 95%  98%  Weight:   88.1 kg   Height:        Intake/Output Summary (Last 24 hours) at 07/01/2018 1549 Last data filed at 07/01/2018 1400 Gross per 24 hour  Intake 840 ml  Output -  Net 840 ml   Filed Weights   06/28/18 1117 06/29/18 0526 07/01/18 0453  Weight: 88 kg 88.1 kg 88.1 kg    Exam:   General: NAD  Cardiovascular: S1, S2 present  Respiratory: CTA B  Abdomen: Soft, nontender, nondistended, bowel sounds present  Musculoskeletal: No pedal edema bilaterally  Skin: Normal  Psychiatry: Normal mood   Data Reviewed: CBC: Recent Labs  Lab 06/28/18 1134 06/28/18 1745 06/29/18 0613 06/30/18 0548  WBC 5.6 5.4 4.9 7.0  NEUTROABS 3.4  --   --  5.1  HGB 13.1 11.6* 11.8* 10.6*  HCT 39.3 35.5* 35.8* 31.6*  MCV 86.9 88.1 87.3 86.6  PLT 172 173 167 638   Basic Metabolic Panel: Recent Labs  Lab 06/28/18 1134 06/28/18 1745 06/29/18 0613 06/30/18 0548 07/01/18 0558  NA 138  --  141 139 142  K 3.9  --  4.0 4.0 3.9  CL 105  --  110 109 111  CO2 22  --  22 22 22   GLUCOSE 114*  --  124* 107* 109*  BUN 25*  --  26* 24* 19  CREATININE 2.53* 2.54* 2.32* 1.74* 1.51*  CALCIUM 8.7*  --  8.8* 8.4* 8.2*  MG  --   --  1.6* 1.9  --    GFR: Estimated Creatinine Clearance: 31.8 mL/min (A) (by C-G formula based on SCr of 1.51 mg/dL (H)). Liver Function Tests: Recent Labs  Lab 06/28/18 1134 06/29/18 0613    AST 29 23  ALT 20 17  ALKPHOS 61 56  BILITOT 0.5 0.7  PROT 7.5 6.3*  ALBUMIN 3.6 3.1*   Recent Labs  Lab 06/28/18 1134  LIPASE 29   No results for input(s): AMMONIA in the last 168 hours. Coagulation Profile: No results for input(s): INR, PROTIME in the last 168 hours. Cardiac Enzymes: No results for input(s): CKTOTAL, CKMB, CKMBINDEX, TROPONINI in the last 168 hours. BNP (last 3 results) No results for input(s): PROBNP in the last 8760 hours. HbA1C: Recent Labs    06/28/18 1745  HGBA1C 6.3*   CBG: Recent Labs  Lab 06/30/18 1115 06/30/18 1703 06/30/18 2118 07/01/18 0736 07/01/18 1226  GLUCAP 104* 119* 134* 120* 118*   Lipid Profile: No results for input(s): CHOL, HDL, LDLCALC, TRIG, CHOLHDL, LDLDIRECT in the last 72 hours. Thyroid Function Tests: No results for input(s): TSH, T4TOTAL, FREET4, T3FREE, THYROIDAB in the last 72 hours. Anemia Panel: No results for input(s): VITAMINB12, FOLATE, FERRITIN, TIBC, IRON, RETICCTPCT in the last 72 hours. Urine analysis:    Component Value Date/Time   COLORURINE YELLOW 06/28/2018 1340   APPEARANCEUR CLEAR 06/28/2018 1340   LABSPEC <1.005 (L) 06/28/2018 1340   PHURINE 6.0 06/28/2018 1340   GLUCOSEU NEGATIVE 06/28/2018 1340   HGBUR NEGATIVE 06/28/2018 1340   BILIRUBINUR NEGATIVE 06/28/2018 1340   KETONESUR NEGATIVE 06/28/2018 1340   PROTEINUR NEGATIVE 06/28/2018 1340   NITRITE NEGATIVE 06/28/2018 1340   LEUKOCYTESUR NEGATIVE 06/28/2018 1340   Sepsis Labs: @LABRCNTIP (procalcitonin:4,lacticidven:4)  ) Recent Results (from the past 240 hour(s))  C difficile quick scan w PCR reflex     Status: None   Collection Time: 06/28/18  3:35 PM  Result Value Ref Range Status   C Diff antigen NEGATIVE NEGATIVE Final   C Diff toxin NEGATIVE NEGATIVE Final   C Diff interpretation No C. difficile detected.  Final    Comment: Performed at [facility], [place] [address] [place], [place] [address]  Gastrointestinal Panel by  PCR , Stool     Status: Abnormal   Collection Time: 06/28/18  3:35 PM  Result Value Ref Range Status   Campylobacter species NOT DETECTED NOT DETECTED Final   Plesimonas shigelloides NOT DETECTED NOT DETECTED Final   Salmonella species NOT DETECTED NOT DETECTED Final   Yersinia enterocolitica NOT DETECTED NOT DETECTED Final   Vibrio species NOT DETECTED NOT DETECTED Final   Vibrio cholerae NOT DETECTED NOT DETECTED Final   Enteroaggregative E coli (EAEC) NOT DETECTED NOT DETECTED Final   Enteropathogenic E coli (EPEC) DETECTED (A) NOT DETECTED Final    Comment: RESULT CALLED TO, READ BACK BY AND VERIFIED WITH: THEHAITSE GEBRU @2041  06/29/18 AKT    Enterotoxigenic E coli (ETEC) NOT DETECTED NOT DETECTED Final   Shiga like toxin producing E coli (STEC) NOT DETECTED NOT DETECTED Final   Shigella/Enteroinvasive E coli (EIEC) NOT DETECTED NOT DETECTED Final   Cryptosporidium  NOT DETECTED NOT DETECTED Final   Cyclospora cayetanensis NOT DETECTED NOT DETECTED Final   Entamoeba histolytica NOT DETECTED NOT DETECTED Final   Giardia lamblia NOT DETECTED NOT DETECTED Final   Adenovirus F40/41 NOT DETECTED NOT DETECTED Final   Astrovirus NOT DETECTED NOT DETECTED Final   Norovirus GI/GII NOT DETECTED NOT DETECTED Final   Rotavirus A NOT DETECTED NOT DETECTED Final   Sapovirus (I, II, IV, and V) NOT DETECTED NOT DETECTED Final    Comment: Performed at [facility], [address], [place], [place] [address]      Studies: No results found.  Scheduled Meds: . aspirin EC  81 mg Oral Daily  . ezetimibe-simvastatin  1 tablet Oral Daily  . heparin  5,000 Units Subcutaneous Q8H  . insulin aspart  0-5 Units Subcutaneous QHS  . insulin aspart  0-9 Units Subcutaneous TID WC  . pantoprazole  20 mg Oral Daily    Continuous Infusions: . sodium chloride 75 mL/hr at 07/01/18 0732  . azithromycin 500 mg (06/30/18 2120)     LOS: 3 days     Alma Friendly, MD [facility]   If 7PM-7AM, please contact night-coverage [email or web address] Password [facility] 07/01/2018, 3:49 PM

## 2018-07-01 NOTE — Care Management Important Message (Signed)
Important Message  Patient Details  Name: Andrea Brooks MRN: [id number] Date of Birth: 1936-03-01   Medicare Important Message Given:  Yes    Kerin Salen 07/01/2018, 10:55 AMImportant Message  Patient Details  Name: Andrea Brooks MRN: [id number] Date of Birth: 1935/12/18   Medicare Important Message Given:  Yes    Kerin Salen 07/01/2018, 10:55 AM

## 2018-07-02 ENCOUNTER — Other Ambulatory Visit: Payer: Self-pay

## 2018-07-02 ENCOUNTER — Observation Stay (HOSPITAL_COMMUNITY)
Admission: EM | Admit: 2018-07-02 | Discharge: 2018-07-05 | Disposition: A | Discharge status: Home / Self Care | Payer: Medicare Other | Attending: Internal Medicine | Admitting: Internal Medicine

## 2018-07-02 ENCOUNTER — Encounter (HOSPITAL_COMMUNITY): Payer: Self-pay | Admitting: *Deleted

## 2018-07-02 ENCOUNTER — Emergency Department (HOSPITAL_COMMUNITY): Payer: Medicare Other

## 2018-07-02 DIAGNOSIS — K219 Gastro-esophageal reflux disease without esophagitis: Secondary | ICD-10-CM | POA: Insufficient documentation

## 2018-07-02 DIAGNOSIS — E1122 Type 2 diabetes mellitus with diabetic chronic kidney disease: Secondary | ICD-10-CM | POA: Insufficient documentation

## 2018-07-02 DIAGNOSIS — Z7982 Long term (current) use of aspirin: Secondary | ICD-10-CM | POA: Insufficient documentation

## 2018-07-02 DIAGNOSIS — Z87891 Personal history of nicotine dependence: Secondary | ICD-10-CM | POA: Insufficient documentation

## 2018-07-02 DIAGNOSIS — I509 Heart failure, unspecified: Secondary | ICD-10-CM

## 2018-07-02 DIAGNOSIS — N183 Chronic kidney disease, stage 3 (moderate): Secondary | ICD-10-CM | POA: Insufficient documentation

## 2018-07-02 DIAGNOSIS — I700 Atherosclerosis of aorta: Secondary | ICD-10-CM | POA: Insufficient documentation

## 2018-07-02 DIAGNOSIS — Z90710 Acquired absence of both cervix and uterus: Secondary | ICD-10-CM | POA: Insufficient documentation

## 2018-07-02 DIAGNOSIS — Z96653 Presence of artificial knee joint, bilateral: Secondary | ICD-10-CM | POA: Insufficient documentation

## 2018-07-02 DIAGNOSIS — I420 Dilated cardiomyopathy: Secondary | ICD-10-CM | POA: Insufficient documentation

## 2018-07-02 DIAGNOSIS — Z79899 Other long term (current) drug therapy: Secondary | ICD-10-CM | POA: Insufficient documentation

## 2018-07-02 DIAGNOSIS — R748 Abnormal levels of other serum enzymes: Secondary | ICD-10-CM | POA: Diagnosis not present

## 2018-07-02 DIAGNOSIS — I5031 Acute diastolic (congestive) heart failure: Secondary | ICD-10-CM | POA: Diagnosis not present

## 2018-07-02 DIAGNOSIS — Z6833 Body mass index (BMI) 33.0-33.9, adult: Secondary | ICD-10-CM | POA: Insufficient documentation

## 2018-07-02 DIAGNOSIS — I051 Rheumatic mitral insufficiency: Secondary | ICD-10-CM | POA: Insufficient documentation

## 2018-07-02 DIAGNOSIS — Z823 Family history of stroke: Secondary | ICD-10-CM | POA: Insufficient documentation

## 2018-07-02 DIAGNOSIS — E1159 Type 2 diabetes mellitus with other circulatory complications: Secondary | ICD-10-CM | POA: Diagnosis present

## 2018-07-02 DIAGNOSIS — I130 Hypertensive heart and chronic kidney disease with heart failure and stage 1 through stage 4 chronic kidney disease, or unspecified chronic kidney disease: Secondary | ICD-10-CM | POA: Insufficient documentation

## 2018-07-02 DIAGNOSIS — Z86010 Personal history of colonic polyps: Secondary | ICD-10-CM | POA: Insufficient documentation

## 2018-07-02 DIAGNOSIS — I214 Non-ST elevation (NSTEMI) myocardial infarction: Secondary | ICD-10-CM

## 2018-07-02 DIAGNOSIS — E785 Hyperlipidemia, unspecified: Secondary | ICD-10-CM | POA: Insufficient documentation

## 2018-07-02 DIAGNOSIS — E119 Type 2 diabetes mellitus without complications: Secondary | ICD-10-CM | POA: Diagnosis not present

## 2018-07-02 DIAGNOSIS — Z9889 Other specified postprocedural states: Secondary | ICD-10-CM | POA: Insufficient documentation

## 2018-07-02 DIAGNOSIS — Z8249 Family history of ischemic heart disease and other diseases of the circulatory system: Secondary | ICD-10-CM | POA: Insufficient documentation

## 2018-07-02 DIAGNOSIS — Z9049 Acquired absence of other specified parts of digestive tract: Secondary | ICD-10-CM | POA: Insufficient documentation

## 2018-07-02 DIAGNOSIS — Z7984 Long term (current) use of oral hypoglycemic drugs: Secondary | ICD-10-CM | POA: Insufficient documentation

## 2018-07-02 DIAGNOSIS — I2510 Atherosclerotic heart disease of native coronary artery without angina pectoris: Secondary | ICD-10-CM | POA: Diagnosis not present

## 2018-07-02 DIAGNOSIS — I10 Essential (primary) hypertension: Secondary | ICD-10-CM | POA: Diagnosis not present

## 2018-07-02 DIAGNOSIS — E669 Obesity, unspecified: Secondary | ICD-10-CM | POA: Insufficient documentation

## 2018-07-02 DIAGNOSIS — R778 Other specified abnormalities of plasma proteins: Secondary | ICD-10-CM | POA: Diagnosis present

## 2018-07-02 DIAGNOSIS — J90 Pleural effusion, not elsewhere classified: Secondary | ICD-10-CM | POA: Insufficient documentation

## 2018-07-02 DIAGNOSIS — F419 Anxiety disorder, unspecified: Secondary | ICD-10-CM | POA: Insufficient documentation

## 2018-07-02 DIAGNOSIS — R7989 Other specified abnormal findings of blood chemistry: Secondary | ICD-10-CM | POA: Diagnosis present

## 2018-07-02 DIAGNOSIS — I5033 Acute on chronic diastolic (congestive) heart failure: Secondary | ICD-10-CM | POA: Insufficient documentation

## 2018-07-02 LAB — I-STAT TROPONIN, ED: TROPONIN I, POC: 0.09 ng/mL — AB (ref 0.00–0.08)

## 2018-07-02 LAB — COMPREHENSIVE METABOLIC PANEL
ALBUMIN: 3.4 g/dL — AB (ref 3.5–5.0)
ALK PHOS: 60 U/L (ref 38–126)
ALT: 19 U/L (ref 0–44)
ANION GAP: 11 (ref 5–15)
AST: 28 U/L (ref 15–41)
BILIRUBIN TOTAL: 0.3 mg/dL (ref 0.3–1.2)
BUN: 12 mg/dL (ref 8–23)
CALCIUM: 8.4 mg/dL — AB (ref 8.9–10.3)
CO2: 22 mmol/L (ref 22–32)
Chloride: 107 mmol/L (ref 98–111)
Creatinine, Ser: 1.1 mg/dL — ABNORMAL HIGH (ref 0.44–1.00)
GFR calc Af Amer: 54 mL/min — ABNORMAL LOW (ref >60)
GFR, EST NON AFRICAN AMERICAN: 46 mL/min — AB (ref >60)
GLUCOSE: 143 mg/dL — AB (ref 70–99)
Potassium: 3.7 mmol/L (ref 3.5–5.1)
Sodium: 140 mmol/L (ref 135–145)
TOTAL PROTEIN: 7.2 g/dL (ref 6.5–8.1)

## 2018-07-02 LAB — BASIC METABOLIC PANEL
ANION GAP: 7 (ref 5–15)
BUN: 14 mg/dL (ref 8–23)
CALCIUM: 7.9 mg/dL — AB (ref 8.9–10.3)
CO2: 20 mmol/L — AB (ref 22–32)
CREATININE: 1.15 mg/dL — AB (ref 0.44–1.00)
Chloride: 112 mmol/L — ABNORMAL HIGH (ref 98–111)
GFR, EST AFRICAN AMERICAN: 51 mL/min — AB (ref >60)
GFR, EST NON AFRICAN AMERICAN: 44 mL/min — AB (ref >60)
Glucose, Bld: 105 mg/dL — ABNORMAL HIGH (ref 70–99)
Potassium: 3.7 mmol/L (ref 3.5–5.1)
SODIUM: 139 mmol/L (ref 135–145)

## 2018-07-02 LAB — CBC WITH DIFFERENTIAL/PLATELET
BASOS PCT: 0 %
Basophils Absolute: 0 10*3/uL (ref 0.0–0.1)
Eosinophils Absolute: 0 10*3/uL (ref 0.0–0.7)
Eosinophils Relative: 0 %
HCT: 35.9 % — ABNORMAL LOW (ref 36.0–46.0)
HEMOGLOBIN: 12 g/dL (ref 12.0–15.0)
Lymphocytes Relative: 9 %
Lymphs Abs: 0.9 10*3/uL (ref 0.7–4.0)
MCH: 28.6 pg (ref 26.0–34.0)
MCHC: 33.4 g/dL (ref 30.0–36.0)
MCV: 85.7 fL (ref 78.0–100.0)
MONOS PCT: 9 %
Monocytes Absolute: 0.9 10*3/uL (ref 0.1–1.0)
NEUTROS PCT: 82 %
Neutro Abs: 8.7 10*3/uL — ABNORMAL HIGH (ref 1.7–7.7)
Platelets: 220 10*3/uL (ref 150–400)
RBC: 4.19 MIL/uL (ref 3.87–5.11)
RDW: 13.8 % (ref 11.5–15.5)
WBC: 10.5 10*3/uL (ref 4.0–10.5)

## 2018-07-02 LAB — GLUCOSE, CAPILLARY
Glucose-Capillary: 112 mg/dL — ABNORMAL HIGH (ref 70–99)
Glucose-Capillary: 107 mg/dL — ABNORMAL HIGH (ref 70–99)

## 2018-07-02 LAB — BRAIN NATRIURETIC PEPTIDE: B Natriuretic Peptide: 345.5 pg/mL — ABNORMAL HIGH (ref 0.0–100.0)

## 2018-07-02 LAB — D-DIMER, QUANTITATIVE: D-Dimer, Quant: 1.31 ug/mL-FEU — ABNORMAL HIGH (ref 0.00–0.50)

## 2018-07-02 LAB — TROPONIN I: Troponin I: 0.14 ng/mL (ref <0.03)

## 2018-07-02 MED ORDER — EZETIMIBE-SIMVASTATIN 10-20 MG PO TABS
1.0000 | ORAL_TABLET | Freq: Every day | ORAL | Status: DC
  Filled 2018-07-02: qty 1

## 2018-07-02 MED ORDER — IOPAMIDOL (ISOVUE-370) INJECTION 76%
INTRAVENOUS | Status: AC
  Filled 2018-07-02: qty 100

## 2018-07-02 MED ORDER — PANTOPRAZOLE SODIUM 20 MG PO TBEC
20.0000 mg | DELAYED_RELEASE_TABLET | Freq: Every day | ORAL | Status: DC
  Administered 2018-07-03 – 2018-07-05 (×2): 20 mg via ORAL
  Filled 2018-07-02 (×3): qty 1

## 2018-07-02 MED ORDER — ASPIRIN 81 MG PO CHEW
324.0000 mg | CHEWABLE_TABLET | Freq: Once | ORAL | Status: AC
  Administered 2018-07-02: 324 mg via ORAL
  Filled 2018-07-02: qty 4

## 2018-07-02 MED ORDER — IOPAMIDOL (ISOVUE-370) INJECTION 76%
100.0000 mL | Freq: Once | INTRAVENOUS | Status: AC | PRN
  Administered 2018-07-02: 75 mL via INTRAVENOUS

## 2018-07-02 MED ORDER — INSULIN ASPART 100 UNIT/ML ~~LOC~~ SOLN
0.0000 [IU] | Freq: Three times a day (TID) | SUBCUTANEOUS | Status: DC
  Administered 2018-07-03: 2 [IU] via SUBCUTANEOUS
  Administered 2018-07-03 – 2018-07-04 (×2): 1 [IU] via SUBCUTANEOUS
  Administered 2018-07-05: 2 [IU] via SUBCUTANEOUS
  Administered 2018-07-05: 1 [IU] via SUBCUTANEOUS

## 2018-07-02 MED ORDER — NITROGLYCERIN 0.4 MG SL SUBL
0.4000 mg | SUBLINGUAL_TABLET | SUBLINGUAL | Status: AC | PRN
  Administered 2018-07-02 – 2018-07-03 (×3): 0.4 mg via SUBLINGUAL
  Filled 2018-07-02 (×2): qty 1

## 2018-07-02 MED ORDER — SODIUM CHLORIDE 0.9 % IV SOLN
250.0000 mL | INTRAVENOUS | Status: DC | PRN
  Administered 2018-07-03: 250 mL via INTRAVENOUS

## 2018-07-02 MED ORDER — ZOLPIDEM TARTRATE 5 MG PO TABS: 5.0000 mg | ORAL_TABLET | Freq: Every evening | ORAL | 0 refills | Status: DC | PRN

## 2018-07-02 MED ORDER — PANTOPRAZOLE SODIUM 20 MG PO TBEC: 20.0000 mg | DELAYED_RELEASE_TABLET | Freq: Every day | ORAL | 0 refills | Status: DC

## 2018-07-02 MED ORDER — LORAZEPAM 1 MG PO TABS: 1.0000 mg | ORAL_TABLET | Freq: Every day | ORAL | Status: DC | PRN

## 2018-07-02 MED ORDER — ONDANSETRON HCL 4 MG/2ML IJ SOLN: 4.0000 mg | Freq: Four times a day (QID) | INTRAMUSCULAR | Status: DC | PRN

## 2018-07-02 MED ORDER — DICYCLOMINE HCL 10 MG PO CAPS: 10.0000 mg | ORAL_CAPSULE | Freq: Three times a day (TID) | ORAL | Status: DC | PRN

## 2018-07-02 MED ORDER — IRBESARTAN 150 MG PO TABS
150.0000 mg | ORAL_TABLET | Freq: Every day | ORAL | Status: DC
  Administered 2018-07-03 – 2018-07-05 (×2): 150 mg via ORAL
  Filled 2018-07-02 (×2): qty 1

## 2018-07-02 MED ORDER — FUROSEMIDE 10 MG/ML IJ SOLN
40.0000 mg | Freq: Once | INTRAMUSCULAR | Status: AC
  Administered 2018-07-02: 40 mg via INTRAVENOUS
  Filled 2018-07-02: qty 4

## 2018-07-02 MED ORDER — DICYCLOMINE HCL 10 MG PO CAPS: 10.0000 mg | ORAL_CAPSULE | Freq: Three times a day (TID) | ORAL | 0 refills | Status: DC | PRN

## 2018-07-02 MED ORDER — ACETAMINOPHEN 500 MG PO TABS
1000.0000 mg | ORAL_TABLET | Freq: Four times a day (QID) | ORAL | Status: DC | PRN
  Administered 2018-07-03 – 2018-07-04 (×4): 1000 mg via ORAL
  Filled 2018-07-02 (×4): qty 2

## 2018-07-02 MED ORDER — SODIUM CHLORIDE 0.9% FLUSH
3.0000 mL | Freq: Two times a day (BID) | INTRAVENOUS | Status: DC
Start: 2018-07-02 — End: 2018-07-05
  Administered 2018-07-03 – 2018-07-05 (×4): 3 mL via INTRAVENOUS

## 2018-07-02 MED ORDER — ENOXAPARIN SODIUM 40 MG/0.4ML ~~LOC~~ SOLN
40.0000 mg | SUBCUTANEOUS | Status: DC
  Administered 2018-07-03: 40 mg via SUBCUTANEOUS
  Filled 2018-07-02: qty 0.4

## 2018-07-02 MED ORDER — ASPIRIN EC 81 MG PO TBEC
81.0000 mg | DELAYED_RELEASE_TABLET | Freq: Every day | ORAL | Status: DC
  Administered 2018-07-03 – 2018-07-05 (×2): 81 mg via ORAL
  Filled 2018-07-02 (×3): qty 1

## 2018-07-02 MED ORDER — SODIUM CHLORIDE 0.9% FLUSH: 3.0000 mL | INTRAVENOUS | Status: DC | PRN

## 2018-07-02 MED ORDER — ZOLPIDEM TARTRATE 5 MG PO TABS
5.0000 mg | ORAL_TABLET | Freq: Every evening | ORAL | Status: DC | PRN
  Administered 2018-07-03 – 2018-07-04 (×3): 5 mg via ORAL
  Filled 2018-07-02 (×3): qty 1

## 2018-07-02 NOTE — H&P (Signed)
History and Physical    Andrea Brooks FFM:[id number] DOB: 06/05/1936 DOA: 07/02/2018  PCP: Esaw Grandchild, NP  Patient coming from: Home  I have personally briefly reviewed patient's old medical records in [place]  Chief Complaint: SOB, chest tightness  HPI: Andrea Brooks is a 82 y.o. female with medical history significant of DM2, HTN.  Patient was recently admitted to our service on 8/6, discharged earlier today due to AKI associated with E.Coli diarrhea.  Diarrhea and AKI resolved (creat back down to 1.1, and one formed stool this AM, none since today).  However, patient had SOB, chest tightness at home.  She remains off of Losartan at home which was held for AKI during admit.  Presents to ED.  Chest tightness intermittent, in chest and B shoulders.  Some SOB.   ED Course: Chest tightness resolved with 1 SL NTG.  Trop 0.14, BNP 345.  CTA chest shows no PE, shows small B pleural effusions.   Review of Systems: As per HPI otherwise 10 point review of systems negative.   Past Medical History:  Diagnosis Date  . Anxiety   . Colon polyps   . Diabetes mellitus without complication (Andrea Brooks)   . Diverticulosis   . Gallstones   . GERD (gastroesophageal reflux disease)   . Hyperlipidemia   . Hypertension   . Obesity     Past Surgical History:  Procedure Laterality Date  . ABDOMINAL HYSTERECTOMY     total  . BREAST BIOPSY Left    neg  . CHOLECYSTECTOMY    . JOINT REPLACEMENT Bilateral    knee  . REPLACEMENT TOTAL KNEE BILATERAL       reports that she quit smoking about 58 years ago. She has a 2.00 pack-year smoking history. She has never used smokeless tobacco. She reports that she does not drink alcohol or use drugs.  No Known Allergies  Family History  Problem Relation Age of Onset  . Hypertension Mother   . Colon cancer Mother   . Stroke Father   . Heart failure Father   . Stroke Sister   . Stroke Brother   . Graves' disease Daughter   . Cancer Paternal  Aunt        breast  . Breast cancer Paternal Aunt   . Graves' disease Sister   . Bone cancer Sister   . Breast cancer Maternal Aunt   . Esophageal cancer Neg Hx   . Rectal cancer Neg Hx   . Liver cancer Neg Hx      Prior to Admission medications   Medication Sig Start Date End Date Taking? Authorizing Provider  acetaminophen (TYLENOL) 500 MG tablet Take 1,000 mg by mouth every 6 (six) hours as needed for moderate pain.   Yes [provider]  aspirin EC 81 MG tablet Take 81 mg by mouth daily.   Yes [provider]  dicyclomine (BENTYL) 10 MG capsule Take 1 capsule (10 mg total) by mouth 3 (three) times daily as needed for spasms. 07/02/18 08/01/18 Yes Alma Friendly, MD  ezetimibe-simvastatin (VYTORIN) 10-20 MG tablet Take 1 tablet by mouth daily.   Yes [provider]  LORazepam (ATIVAN) 1 MG tablet Take 1 tablet (1 mg total) by mouth daily as needed for anxiety. 06/15/18  Yes Danford, [name] D, NP  metFORMIN (GLUCOPHAGE) 500 MG tablet TAKE 1 TABLET TWICE A DAY WITH MEALS Patient taking differently: Take 500 mg by mouth 2 (two) times daily with a meal.  05/02/18  Yes Danford, [name] D, NP  pantoprazole (PROTONIX) 20 MG tablet Take 1 tablet (20 mg total) by mouth daily. 07/03/18 08/02/18 Yes Alma Friendly, MD  valsartan (DIOVAN) 160 MG tablet Take 1 tablet (160 mg total) by mouth daily. PATIENT MUST HAVE OFFICE VISIT PRIOR TO ANY FURTHER REFILLS Patient taking differently: Take 160 mg by mouth daily.  03/17/18  Yes Danford, [name] D, NP  zolpidem (AMBIEN) 5 MG tablet Take 1 tablet (5 mg total) by mouth at bedtime as needed for sleep. 07/02/18  Yes Alma Friendly, MD    Physical Exam: Vitals:   07/02/18 2020 07/02/18 2155  BP: (!) 172/87 (!) 171/71  Pulse: 77 75  Resp: (!) 25 (!) 35  Temp: 98.2 F (36.8 C)   TempSrc: Oral   SpO2: 94% 95%    Constitutional: NAD, calm, comfortable Eyes: PERRL, lids and conjunctivae normal ENMT: Mucous membranes are  moist. Posterior pharynx clear of any exudate or lesions.Normal dentition.  Neck: normal, supple, no masses, no thyromegaly Respiratory: clear to auscultation bilaterally, no wheezing, no crackles. Normal respiratory effort. No accessory muscle use.  Cardiovascular: Trace BLE edema Abdomen: no tenderness, no masses palpated. No hepatosplenomegaly. Bowel sounds positive.  Musculoskeletal: no clubbing / cyanosis. No joint deformity upper and lower extremities. Good ROM, no contractures. Normal muscle tone.  Skin: no rashes, lesions, ulcers. No induration Neurologic: CN 2-12 grossly intact. Sensation intact, DTR normal. Strength 5/5 in all 4.  Psychiatric: Normal judgment and insight. Alert and oriented x 3. Normal mood.    Labs on Admission: I have personally reviewed following labs and imaging studies  CBC: Recent Labs  Lab 06/28/18 1134 06/28/18 1745 06/29/18 0613 06/30/18 0548 07/02/18 2106  WBC 5.6 5.4 4.9 7.0 10.5  NEUTROABS 3.4  --   --  5.1 8.7*  HGB 13.1 11.6* 11.8* 10.6* 12.0  HCT 39.3 35.5* 35.8* 31.6* 35.9*  MCV 86.9 88.1 87.3 86.6 85.7  PLT 172 173 167 189 287   Basic Metabolic Panel: Recent Labs  Lab 06/29/18 0613 06/30/18 0548 07/01/18 0558 07/02/18 0611 07/02/18 2106  NA 141 139 142 139 140  K 4.0 4.0 3.9 3.7 3.7  CL 110 109 111 112* 107  CO2 22 22 22  20* 22  GLUCOSE 124* 107* 109* 105* 143*  BUN 26* 24* 19 14 12   CREATININE 2.32* 1.74* 1.51* 1.15* 1.10*  CALCIUM 8.8* 8.4* 8.2* 7.9* 8.4*  MG 1.6* 1.9  --   --   --    GFR: Estimated Creatinine Clearance: 43 mL/min (A) (by C-G formula based on SCr of 1.1 mg/dL (H)). Liver Function Tests: Recent Labs  Lab 06/28/18 1134 06/29/18 0613 07/02/18 2106  AST 29 23 28   ALT 20 17 19   ALKPHOS 61 56 60  BILITOT 0.5 0.7 0.3  PROT 7.5 6.3* 7.2  ALBUMIN 3.6 3.1* 3.4*   Recent Labs  Lab 06/28/18 1134  LIPASE 29   No results for input(s): AMMONIA in the last 168 hours. Coagulation Profile: No results for  input(s): INR, PROTIME in the last 168 hours. Cardiac Enzymes: Recent Labs  Lab 07/02/18 2106  TROPONINI 0.14*   BNP (last 3 results) No results for input(s): PROBNP in the last 8760 hours. HbA1C: No results for input(s): HGBA1C in the last 72 hours. CBG: Recent Labs  Lab 07/01/18 1226 07/01/18 1620 07/01/18 2003 07/02/18 0740 07/02/18 1141  GLUCAP 118* 116* 160* 112* 107*   Lipid Profile: No results for input(s): CHOL, HDL, LDLCALC, TRIG, CHOLHDL, LDLDIRECT  in the last 72 hours. Thyroid Function Tests: No results for input(s): TSH, T4TOTAL, FREET4, T3FREE, THYROIDAB in the last 72 hours. Anemia Panel: No results for input(s): VITAMINB12, FOLATE, FERRITIN, TIBC, IRON, RETICCTPCT in the last 72 hours. Urine analysis:    Component Value Date/Time   COLORURINE YELLOW 06/28/2018 1340   APPEARANCEUR CLEAR 06/28/2018 1340   LABSPEC <1.005 (L) 06/28/2018 1340   PHURINE 6.0 06/28/2018 1340   GLUCOSEU NEGATIVE 06/28/2018 1340   HGBUR NEGATIVE 06/28/2018 1340   BILIRUBINUR NEGATIVE 06/28/2018 1340   KETONESUR NEGATIVE 06/28/2018 1340   PROTEINUR NEGATIVE 06/28/2018 1340   NITRITE NEGATIVE 06/28/2018 1340   LEUKOCYTESUR NEGATIVE 06/28/2018 1340    Radiological Exams on Admission: Dg Chest 2 View  Result Date: 07/02/2018 CLINICAL DATA:  Shortness of breath. EXAM: CHEST - 2 VIEW COMPARISON:  None FINDINGS: Mild cardiac enlargement. Pulmonary vascular congestion. No airspace opacities. The visualized skeletal structures are unremarkable. IMPRESSION: 1. Cardiac enlargement and pulmonary vascular congestion. Electronically Signed   By: Kerby Moors M.D.   On: 07/02/2018 20:48   Ct Angio Chest Pe W/cm &/or Wo Cm  Result Date: 07/02/2018 CLINICAL DATA:  Intermediate probability for pulmonary embolus. Mid sternal chest pain. EXAM: CT ANGIOGRAPHY CHEST WITH CONTRAST TECHNIQUE: Multidetector CT imaging of the chest was performed using the standard protocol during bolus administration  of intravenous contrast. Multiplanar CT image reconstructions and MIPs were obtained to evaluate the vascular anatomy. CONTRAST:  76mL ISOVUE-370 IOPAMIDOL (ISOVUE-370) INJECTION 76% COMPARISON:  None. FINDINGS: Cardiovascular: Moderate cardiac enlargement. No pericardial effusion. Aortic atherosclerosis. Calcification in the LAD and RCA coronary artery noted. The main pulmonary artery appears patent. No saddle embolus. No lobar or segmental pulmonary artery filling defects identified. Mediastinum/Nodes: Normal appearance of the thyroid gland. There is diminished AP diameter of the trachea which may be a manifestation of tracheobronchomalacia. Normal appearance of the esophagus. No supraclavicular, axillary or mediastinal adenopathy no hilar adenopathy. Lungs/Pleura: Small bilateral pleural effusions noted, left greater than right. Subsegmental atelectasis within the left lower lobe. Mosaic attenuation pattern is identified bilaterally which may be a manifestation of small airways disease. Upper Abdomen: No acute abnormality. Musculoskeletal: No chest wall abnormality. No acute or significant osseous findings. Review of the MIP images confirms the above findings. IMPRESSION: 1. No evidence for acute pulmonary embolus. 2. Small bilateral pleural effusions left greater than right. 3. Multi vessel coronary artery atherosclerotic calcifications. Aortic Atherosclerosis (ICD10-I70.0). Electronically Signed   By: Kerby Moors M.D.   On: 07/02/2018 22:45    EKG: Independently reviewed.  Assessment/Plan Principal Problem:   Acute diastolic CHF (congestive heart failure) (HCC) Active Problems:   Essential hypertension   Diabetes mellitus without complication (HCC)   Elevated troponin    1. Acute CHF - most likely patient has fluid overload from our (sucessful) fluid resuscitation for her AKI and diarrhea over the last 4 days.  DDx includes ACS though this seems less likely and cards wasn't too impressed  either.  PE and Aortic dissection have been reasonably ruled out by the CTA study. 1. CHF pathway 2. Lasix 40mg  IV x1 now 3. Will hold off on ordering further lasix for the moment to allow for re-assessment in AM 4. Serial trops 5. Tele monitor 6. 2d echo 7. Got ASA in ED 8. EDP spoke with cards who will put her on the list and see in AM. 9. Didn't recommend heparin gtt if pain resolved 10. PRN SL NTG for CP (currently resolved) 2. HTN - 1. Resume ARB  3. DM2 - 1. Hold metformin (got contrast study) 2. Sensitive SSI AC  DVT prophylaxis: Lovenox Code Status: Full Family Communication: Family at bedside Disposition Plan: Home after admit Consults called: EDP spoke with  Admission status: Place in [place], [place] [facility] Pager [phone] Only works nights!  If 7AM-7PM, please contact the primary day team physician taking care of patient  [email or web address] Password [facility]  07/02/2018, 11:27 PM

## 2018-07-02 NOTE — ED Triage Notes (Signed)
Pt arrives to ED with her family with c/o SOB. Pt reports she was just d/c today from the hospital and since being home she has had shortness of breath and soreness in her shoulders and back area. She says she feels like she cannot take a full deep breath.

## 2018-07-02 NOTE — ED Notes (Addendum)
Date and time results received: 07/02/18 2251   Test: troponin Critical Value: 0.14  Name of Provider Notified: Regenia Skeeter, MD  Orders Received? Or Actions Taken?:

## 2018-07-02 NOTE — ED Provider Notes (Signed)
[facility] DEPT Provider Note   CSN: [id number] Arrival date & time: 07/02/18  2009     History   Chief Complaint Chief Complaint  Patient presents with  . Shortness of Breath    HPI Andrea Brooks is a 82 y.o. female.  HPI  82 year old female who was discharged this afternoon from the hospital for acute kidney injury and diarrhea presents with shortness of breath and pain.  She states that just prior to leaving the hospital she developed soreness across her chest, anterior neck, and back.  She states that she did not tell anyone about this.  On the way home she started complaining of it as well as trouble getting a full breath.  Pain does not seem to worsen with breathing but she feels like she can get a full breath.  No coughing or leg swelling.  She has been receiving IV fluids for multiple days due to the acute kidney injury.  No heart history.  She does have hypertension, hyperlipidemia, diabetes.  She took Tylenol without any relief.  Her discomforts about a 7 out of 10.  Occasionally the chest pain will go to her back but often times the back pain and chest pain are separate.  No abdominal pain or recurrent vomiting or diarrhea.  Past Medical History:  Diagnosis Date  . Anxiety   . Colon polyps   . Diabetes mellitus without complication ([place])   . Diverticulosis   . Gallstones   . GERD (gastroesophageal reflux disease)   . Hyperlipidemia   . Hypertension   . Obesity     Patient Active Problem List   Diagnosis Date Noted  . Acute diastolic CHF (congestive heart failure) ([place]) 07/02/2018  . Elevated troponin 07/02/2018  . AKI (acute kidney injury) ([place]) 06/28/2018  . Watery diarrhea 06/28/2018  . Diabetes mellitus without complication ([place]) 09/98/3382  . Diverticulitis-  mild symptoms 03/30/2018  . Irritable bowel syndrome with both constipation and diarrhea 03/30/2018  . Healthcare maintenance 03/17/2018  . Pain of right heel 07/06/2017   . Loose stools 06/24/2017  . Right-sided thoracic back pain 06/24/2017  . Hyperlipidemia 04/22/2017  . GERD (gastroesophageal reflux disease) 04/22/2017  . Impaired glucose metabolism 04/22/2017  . Essential hypertension 04/22/2017  . Anxiety 04/22/2017    Past Surgical History:  Procedure Laterality Date  . ABDOMINAL HYSTERECTOMY     total  . BREAST BIOPSY Left    neg  . CHOLECYSTECTOMY    . JOINT REPLACEMENT Bilateral    knee  . REPLACEMENT TOTAL KNEE BILATERAL       OB History   None      Home Medications    Prior to Admission medications   Medication Sig Start Date End Date Taking? Authorizing Provider  acetaminophen (TYLENOL) 500 MG tablet Take 1,000 mg by mouth every 6 (six) hours as needed for moderate pain.   Yes [provider]  aspirin EC 81 MG tablet Take 81 mg by mouth daily.   Yes [provider]  dicyclomine (BENTYL) 10 MG capsule Take 1 capsule (10 mg total) by mouth 3 (three) times daily as needed for spasms. 07/02/18 08/01/18 Yes Alma Friendly, MD  ezetimibe-simvastatin (VYTORIN) 10-20 MG tablet Take 1 tablet by mouth daily.   Yes [provider]  LORazepam (ATIVAN) 1 MG tablet Take 1 tablet (1 mg total) by mouth daily as needed for anxiety. 06/15/18  Yes Danford, [name] D, NP  metFORMIN (GLUCOPHAGE) 500 MG tablet TAKE 1 TABLET  TWICE A DAY WITH MEALS Patient taking differently: Take 500 mg by mouth 2 (two) times daily with a meal.  05/02/18  Yes Danford, Katy D, NP  pantoprazole (PROTONIX) 20 MG tablet Take 1 tablet (20 mg total) by mouth daily. 07/03/18 08/02/18 Yes Alma Friendly, MD  valsartan (DIOVAN) 160 MG tablet Take 1 tablet (160 mg total) by mouth daily. PATIENT MUST HAVE OFFICE VISIT PRIOR TO ANY FURTHER REFILLS Patient taking differently: Take 160 mg by mouth daily.  03/17/18  Yes Danford, [name] D, NP  zolpidem (AMBIEN) 5 MG tablet Take 1 tablet (5 mg total) by mouth at bedtime as needed for sleep. 07/02/18  Yes  Alma Friendly, MD    Family History Family History  Problem Relation Age of Onset  . Hypertension Mother   . Colon cancer Mother   . Stroke Father   . Heart failure Father   . Stroke Sister   . Stroke Brother   . Graves' disease Daughter   . Cancer Paternal Aunt        breast  . Breast cancer Paternal Aunt   . Graves' disease Sister   . Bone cancer Sister   . Breast cancer Maternal Aunt   . Esophageal cancer Neg Hx   . Rectal cancer Neg Hx   . Liver cancer Neg Hx     Social History Social History   Tobacco Use  . Smoking status: Former Smoker    Packs/day: 1.00    Years: 2.00    Pack years: 2.00    Last attempt to quit: 11/24/1959    Years since quitting: 58.6  . Smokeless tobacco: Never Used  Substance Use Topics  . Alcohol use: No  . Drug use: No     Allergies   Patient has no known allergies.   Review of Systems Review of Systems  Constitutional: Negative for fever.  Respiratory: Positive for shortness of breath.   Cardiovascular: Positive for chest pain. Negative for leg swelling.  Gastrointestinal: Negative for abdominal pain, diarrhea and vomiting.  Musculoskeletal: Positive for back pain and neck pain.  All other systems reviewed and are negative.    Physical Exam Updated Vital Signs BP (!) 159/78 (BP Location: Left Arm)   Pulse 75   Temp 98.2 F (36.8 C) (Oral)   Resp 11   SpO2 95%   Physical Exam  Constitutional: She is oriented to person, place, and time. She appears well-developed and well-nourished.  Non-toxic appearance. She does not appear ill. No distress.  HENT:  Head: Normocephalic and atraumatic.  Right Ear: External ear normal.  Left Ear: External ear normal.  Nose: Nose normal.  Eyes: Right eye exhibits no discharge. Left eye exhibits no discharge.  Cardiovascular: Normal rate, regular rhythm and normal heart sounds.  Pulmonary/Chest: Effort normal. No accessory muscle usage. Tachypnea noted. No respiratory distress.  She has no wheezes. She has rales (mild, bibasilar).  Abdominal: Soft. She exhibits no distension. There is no tenderness.  Musculoskeletal:       Right lower leg: She exhibits no edema.       Left lower leg: She exhibits no edema.  Neurological: She is alert and oriented to person, place, and time.  Skin: Skin is warm and dry.  Nursing note and vitals reviewed.    ED Treatments / Results  Labs (all labs ordered are listed, but only abnormal results are displayed) Labs Reviewed  COMPREHENSIVE METABOLIC PANEL - Abnormal; Notable for the following components:  Result Value   Glucose, Bld 143 (*)    Creatinine, Ser 1.10 (*)    Calcium 8.4 (*)    Albumin 3.4 (*)    GFR calc non Af Amer 46 (*)    GFR calc Af Amer 54 (*)    All other components within normal limits  BRAIN NATRIURETIC PEPTIDE - Abnormal; Notable for the following components:   B Natriuretic Peptide 345.5 (*)    All other components within normal limits  CBC WITH DIFFERENTIAL/PLATELET - Abnormal; Notable for the following components:   HCT 35.9 (*)    Neutro Abs 8.7 (*)    All other components within normal limits  D-DIMER, QUANTITATIVE (NOT AT [facility]) - Abnormal; Notable for the following components:   D-Dimer, Quant 1.31 (*)    All other components within normal limits  TROPONIN I - Abnormal; Notable for the following components:   Troponin I 0.14 (*)    All other components within normal limits  I-STAT TROPONIN, ED - Abnormal; Notable for the following components:   Troponin i, poc 0.09 (*)    All other components within normal limits  TROPONIN I  TROPONIN I  TROPONIN I  BASIC METABOLIC PANEL    EKG EKG Interpretation  Date/Time:  Saturday July 02 2018 20:26:05 EDT Ventricular Rate:  77 PR Interval:    QRS Duration: 96 QT Interval:  364 QTC Calculation: 412 R Axis:   -28 Text Interpretation:  Sinus rhythm Borderline left axis deviation Low voltage, precordial leads Consider anterior infarct  Baseline wander in lead(s) I III aVL No old tracing to compare Confirmed by Sherwood Gambler [phone]) on 07/02/2018 8:28:14 PM   Radiology Dg Chest 2 View  Result Date: 07/02/2018 CLINICAL DATA:  Shortness of breath. EXAM: CHEST - 2 VIEW COMPARISON:  None FINDINGS: Mild cardiac enlargement. Pulmonary vascular congestion. No airspace opacities. The visualized skeletal structures are unremarkable. IMPRESSION: 1. Cardiac enlargement and pulmonary vascular congestion. Electronically Signed   By: Kerby Moors M.D.   On: 07/02/2018 20:48   Ct Angio Chest Pe W/cm &/or Wo Cm  Result Date: 07/02/2018 CLINICAL DATA:  Intermediate probability for pulmonary embolus. Mid sternal chest pain. EXAM: CT ANGIOGRAPHY CHEST WITH CONTRAST TECHNIQUE: Multidetector CT imaging of the chest was performed using the standard protocol during bolus administration of intravenous contrast. Multiplanar CT image reconstructions and MIPs were obtained to evaluate the vascular anatomy. CONTRAST:  85mL ISOVUE-370 IOPAMIDOL (ISOVUE-370) INJECTION 76% COMPARISON:  None. FINDINGS: Cardiovascular: Moderate cardiac enlargement. No pericardial effusion. Aortic atherosclerosis. Calcification in the LAD and RCA coronary artery noted. The main pulmonary artery appears patent. No saddle embolus. No lobar or segmental pulmonary artery filling defects identified. Mediastinum/Nodes: Normal appearance of the thyroid gland. There is diminished AP diameter of the trachea which may be a manifestation of tracheobronchomalacia. Normal appearance of the esophagus. No supraclavicular, axillary or mediastinal adenopathy no hilar adenopathy. Lungs/Pleura: Small bilateral pleural effusions noted, left greater than right. Subsegmental atelectasis within the left lower lobe. Mosaic attenuation pattern is identified bilaterally which may be a manifestation of small airways disease. Upper Abdomen: No acute abnormality. Musculoskeletal: No chest wall abnormality. No  acute or significant osseous findings. Review of the MIP images confirms the above findings. IMPRESSION: 1. No evidence for acute pulmonary embolus. 2. Small bilateral pleural effusions left greater than right. 3. Multi vessel coronary artery atherosclerotic calcifications. Aortic Atherosclerosis (ICD10-I70.0). Electronically Signed   By: Kerby Moors M.D.   On: 07/02/2018 22:45    Procedures .Critical Care  Performed by: Sherwood Gambler, MD Authorized by: Sherwood Gambler, MD   Critical care provider statement:    Critical care time (minutes):  35   Critical care time was exclusive of:  Separately billable procedures and treating other patients   Critical care was necessary to treat or prevent imminent or life-threatening deterioration of the following conditions:  Cardiac failure and respiratory failure   Critical care was time spent personally by me on the following activities:  Development of treatment plan with patient or surrogate, discussions with consultants, evaluation of patient's response to treatment, examination of patient, obtaining history from patient or surrogate, ordering and performing treatments and interventions, ordering and review of laboratory studies, ordering and review of radiographic studies, pulse oximetry, re-evaluation of patient's condition and review of old charts   (including critical care time)  Medications Ordered in ED Medications  nitroGLYCERIN (NITROSTAT) SL tablet 0.4 mg (0.4 mg Sublingual Given 07/02/18 2158)  iopamidol (ISOVUE-370) 76 % injection (has no administration in time range)  acetaminophen (TYLENOL) tablet 1,000 mg (has no administration in time range)  aspirin EC tablet 81 mg (has no administration in time range)  dicyclomine (BENTYL) capsule 10 mg (has no administration in time range)  ezetimibe-simvastatin (VYTORIN) 10-20 MG per tablet 1 tablet (has no administration in time range)  LORazepam (ATIVAN) tablet 1 mg (has no administration in  time range)  pantoprazole (PROTONIX) EC tablet 20 mg (has no administration in time range)  irbesartan (AVAPRO) tablet 150 mg (has no administration in time range)  zolpidem (AMBIEN) tablet 5 mg (has no administration in time range)  sodium chloride flush (NS) 0.9 % injection 3 mL (has no administration in time range)  sodium chloride flush (NS) 0.9 % injection 3 mL (has no administration in time range)  0.9 %  sodium chloride infusion (has no administration in time range)  ondansetron (ZOFRAN) injection 4 mg (has no administration in time range)  enoxaparin (LOVENOX) injection 40 mg (has no administration in time range)  insulin aspart (novoLOG) injection 0-9 Units (has no administration in time range)  aspirin chewable tablet 324 mg (324 mg Oral Given 07/02/18 2106)  iopamidol (ISOVUE-370) 76 % injection 100 mL (75 mLs Intravenous Contrast Given 07/02/18 2209)  furosemide (LASIX) injection 40 mg (40 mg Intravenous Given 07/02/18 2335)     Initial Impression / Assessment and Plan / ED Course  I have reviewed the triage vital signs and the nursing notes.  Pertinent labs & imaging results that were available during my care of the patient were reviewed by me and considered in my medical decision making (see chart for details).     Patient's shortness of breath and chest discomfort appear to be coming from fluid overload.  This is likely related to her recent admission where she was given IV fluids for significant dehydration and acute kidney injury.  No known cardiac history.  Low level troponin is probably from the strain on her heart as a secondary mechanism given the CHF.  I discussed with cardiology fellow, Dr. Charissa Bash, who agrees and recommends heparin only if the patient develops ischemic chest pain.  She was given 1 nitroglycerin and her chest discomfort is gone.  However she also states is very positional.  I have given her aspirin and a dose of Lasix.  CT does not show a PE. Discussed with  Dr. Alcario Drought, will hold off on heparin as she is pain free at this time. Cards to consult tomorrow.   Final Clinical Impressions(s) /  ED Diagnoses   Final diagnoses:  Acute congestive heart failure, unspecified heart failure type [facility])    ED Discharge Orders    None       Sherwood Gambler, MD 07/02/18 [phone]

## 2018-07-02 NOTE — ED Notes (Signed)
ED TO INPATIENT HANDOFF REPORT  Name/Age/Gender Andrea Brooks 82 y.o. female  Code Status    Code Status Orders  (From admission, onward)         Start     Ordered   07/02/18 2310  Full code  Continuous     07/02/18 2311        Code Status History    Date Active Date Inactive Code Status Order ID Comments User Context   06/28/2018 1719 07/02/2018 1551 Full Code [id number]  Elodia Florence., MD Inpatient      Home/SNF/Other Home  Chief Complaint Shortness of Breath/ Sorness of Mouth and Shoulders  Level of Care/Admitting Diagnosis ED Disposition    ED Disposition Condition [facility] Area: [facility] [100102]  Level of Care: Telemetry [5]  Admit to tele based on following criteria: Monitor for Ischemic changes  Admit to tele based on following criteria: Acute CHF  Diagnosis: Acute diastolic CHF (congestive heart failure) [facility]) [[id number]]  Admitting Physician: Etta Quill [phone]  Attending Physician: Etta Quill [[id number]]  PT Class (Do Not Modify): Observation [104]  PT Acc Code (Do Not Modify): Observation [10022]       Medical History Past Medical History:  Diagnosis Date  . Anxiety   . Colon polyps   . Diabetes mellitus without complication ([place])   . Diverticulosis   . Gallstones   . GERD (gastroesophageal reflux disease)   . Hyperlipidemia   . Hypertension   . Obesity     Allergies No Known Allergies  IV Location/Drains/Wounds Patient Lines/Drains/Airways Status   Active Line/Drains/Airways    Name:   Placement date:   Placement time:   Site:   Days:   Peripheral IV 07/02/18 Right Antecubital   07/02/18    2105    Antecubital   less than 1          Labs/Imaging Results for orders placed or performed during the hospital encounter of 07/02/18 (from the past 48 hour(s))  Brain natriuretic peptide     Status: Abnormal   Collection Time: 07/02/18  8:48 PM  Result Value Ref Range   B Natriuretic  Peptide 345.5 (H) 0.0 - 100.0 pg/mL    Comment: Performed at [facility], [place] [address] [place], [place] [address]  Comprehensive metabolic panel     Status: Abnormal   Collection Time: 07/02/18  9:06 PM  Result Value Ref Range   Sodium 140 135 - 145 mmol/L   Potassium 3.7 3.5 - 5.1 mmol/L   Chloride 107 98 - 111 mmol/L   CO2 22 22 - 32 mmol/L   Glucose, Bld 143 (H) 70 - 99 mg/dL   BUN 12 8 - 23 mg/dL   Creatinine, Ser 1.10 (H) 0.44 - 1.00 mg/dL   Calcium 8.4 (L) 8.9 - 10.3 mg/dL   Total Protein 7.2 6.5 - 8.1 g/dL   Albumin 3.4 (L) 3.5 - 5.0 g/dL   AST 28 15 - 41 U/L   ALT 19 0 - 44 U/L   Alkaline Phosphatase 60 38 - 126 U/L   Total Bilirubin 0.3 0.3 - 1.2 mg/dL   GFR calc non Af Amer 46 (L) >60 mL/min   GFR calc Af Amer 54 (L) >60 mL/min    Comment: (NOTE) The eGFR has been calculated using the CKD EPI equation. This calculation has not been validated in all clinical situations. eGFR's persistently <60 mL/min signify possible Chronic Kidney Disease.  Anion gap 11 5 - 15    Comment: Performed at [facility], [place] [address], [place], [place] [address]  CBC with Differential     Status: Abnormal   Collection Time: 07/02/18  9:06 PM  Result Value Ref Range   WBC 10.5 4.0 - 10.5 K/uL   RBC 4.19 3.87 - 5.11 MIL/uL   Hemoglobin 12.0 12.0 - 15.0 g/dL   HCT 35.9 (L) 36.0 - 46.0 %   MCV 85.7 78.0 - 100.0 fL   MCH 28.6 26.0 - 34.0 pg   MCHC 33.4 30.0 - 36.0 g/dL   RDW 13.8 11.5 - 15.5 %   Platelets 220 150 - 400 K/uL   Neutrophils Relative % 82 %   Neutro Abs 8.7 (H) 1.7 - 7.7 K/uL   Lymphocytes Relative 9 %   Lymphs Abs 0.9 0.7 - 4.0 K/uL   Monocytes Relative 9 %   Monocytes Absolute 0.9 0.1 - 1.0 K/uL   Eosinophils Relative 0 %   Eosinophils Absolute 0.0 0.0 - 0.7 K/uL   Basophils Relative 0 %   Basophils Absolute 0.0 0.0 - 0.1 K/uL    Comment: Performed at [facility], [place] [address] [place], [place]  [address]  D-dimer, quantitative     Status: Abnormal   Collection Time: 07/02/18  9:06 PM  Result Value Ref Range   D-Dimer, Quant 1.31 (H) 0.00 - 0.50 ug/mL-FEU    Comment: (NOTE) At the manufacturer cut-off of 0.50 ug/mL FEU, this assay has been documented to exclude PE with a sensitivity and negative predictive value of 97 to 99%.  At this time, this assay has not been approved by the FDA to exclude DVT/VTE. Results should be correlated with clinical presentation. Performed at [facility], [place] [address], [place], [place] [address]   Troponin I     Status: Abnormal   Collection Time: 07/02/18  9:06 PM  Result Value Ref Range   Troponin I 0.14 (HH) <0.03 ng/mL    Comment: CRITICAL RESULT CALLED TO, READ BACK BY AND VERIFIED WITH: Geryl Councilman RN [id number] 07/02/2018 [place] Performed at [place] [address], [place], [place] [address]   I-stat troponin, ED     Status: Abnormal   Collection Time: 07/02/18  9:15 PM  Result Value Ref Range   Troponin i, poc 0.09 (HH) 0.00 - 0.08 ng/mL   Comment NOTIFIED PHYSICIAN    Comment 3            Comment: Due to the release kinetics of cTnI, a negative result within the first hours of the onset of symptoms does not rule out myocardial infarction with certainty. If myocardial infarction is still suspected, repeat the test at appropriate intervals.    Dg Chest 2 View  Result Date: 07/02/2018 CLINICAL DATA:  Shortness of breath. EXAM: CHEST - 2 VIEW COMPARISON:  None FINDINGS: Mild cardiac enlargement. Pulmonary vascular congestion. No airspace opacities. The visualized skeletal structures are unremarkable. IMPRESSION: 1. Cardiac enlargement and pulmonary vascular congestion. Electronically Signed   By: Kerby Moors M.D.   On: 07/02/2018 20:48   Ct Angio Chest Pe W/cm &/or Wo Cm  Result Date: 07/02/2018 CLINICAL DATA:  Intermediate probability for pulmonary embolus. Mid sternal chest pain. EXAM: CT  ANGIOGRAPHY CHEST WITH CONTRAST TECHNIQUE: Multidetector CT imaging of the chest was performed using the standard protocol during bolus administration of intravenous contrast. Multiplanar CT image reconstructions and MIPs were obtained to evaluate  the vascular anatomy. CONTRAST:  66m ISOVUE-370 IOPAMIDOL (ISOVUE-370) INJECTION 76% COMPARISON:  None. FINDINGS: Cardiovascular: Moderate cardiac enlargement. No pericardial effusion. Aortic atherosclerosis. Calcification in the LAD and RCA coronary artery noted. The main pulmonary artery appears patent. No saddle embolus. No lobar or segmental pulmonary artery filling defects identified. Mediastinum/Nodes: Normal appearance of the thyroid gland. There is diminished AP diameter of the trachea which may be a manifestation of tracheobronchomalacia. Normal appearance of the esophagus. No supraclavicular, axillary or mediastinal adenopathy no hilar adenopathy. Lungs/Pleura: Small bilateral pleural effusions noted, left greater than right. Subsegmental atelectasis within the left lower lobe. Mosaic attenuation pattern is identified bilaterally which may be a manifestation of small airways disease. Upper Abdomen: No acute abnormality. Musculoskeletal: No chest wall abnormality. No acute or significant osseous findings. Review of the MIP images confirms the above findings. IMPRESSION: 1. No evidence for acute pulmonary embolus. 2. Small bilateral pleural effusions left greater than right. 3. Multi vessel coronary artery atherosclerotic calcifications. Aortic Atherosclerosis (ICD10-I70.0). Electronically Signed   By: TKerby MoorsM.D.   On: 07/02/2018 22:45    Pending Labs Unresulted Labs (From admission, onward)    Start     Ordered   07/03/18 01610 Basic metabolic panel  Daily,   R     07/02/18 2311   07/02/18 2308  Troponin I (q 6hr x 3)  Now then every 6 hours,   R     07/02/18 2307          Vitals/Pain Today's Vitals   07/02/18 2020 07/02/18 2110 07/02/18  2155 07/02/18 2333  BP: (!) 172/87  (!) 171/71 (!) 159/78  Pulse: 77  75 75  Resp: (!) 25  (!) 35 11  Temp: 98.2 F (36.8 C)     TempSrc: Oral     SpO2: 94%  95% 95%  PainSc:  8       Isolation Precautions No active isolations  Medications Medications  nitroGLYCERIN (NITROSTAT) SL tablet 0.4 mg (0.4 mg Sublingual Given 07/02/18 2158)  iopamidol (ISOVUE-370) 76 % injection (has no administration in time range)  acetaminophen (TYLENOL) tablet 1,000 mg (has no administration in time range)  aspirin EC tablet 81 mg (has no administration in time range)  dicyclomine (BENTYL) capsule 10 mg (has no administration in time range)  ezetimibe-simvastatin (VYTORIN) 10-20 MG per tablet 1 tablet (has no administration in time range)  LORazepam (ATIVAN) tablet 1 mg (has no administration in time range)  pantoprazole (PROTONIX) EC tablet 20 mg (has no administration in time range)  irbesartan (AVAPRO) tablet 150 mg (has no administration in time range)  zolpidem (AMBIEN) tablet 5 mg (has no administration in time range)  sodium chloride flush (NS) 0.9 % injection 3 mL (has no administration in time range)  sodium chloride flush (NS) 0.9 % injection 3 mL (has no administration in time range)  0.9 %  sodium chloride infusion (has no administration in time range)  ondansetron (ZOFRAN) injection 4 mg (has no administration in time range)  enoxaparin (LOVENOX) injection 40 mg (has no administration in time range)  insulin aspart (novoLOG) injection 0-9 Units (has no administration in time range)  aspirin chewable tablet 324 mg (324 mg Oral Given 07/02/18 2106)  iopamidol (ISOVUE-370) 76 % injection 100 mL (75 mLs Intravenous Contrast Given 07/02/18 2209)  furosemide (LASIX) injection 40 mg (40 mg Intravenous Given 07/02/18 2335)    Mobility walks with person assist

## 2018-07-02 NOTE — Progress Notes (Signed)
Discharge instructions and medications discussed with patient.  AVS given to patient.  All questions answered.  

## 2018-07-02 NOTE — Discharge Summary (Signed)
Discharge Summary  Andrea Brooks BJS:[id number] DOB: [birth year]  PCP: Andrea Marble D, NP  Admit date: 06/28/2018 Discharge date: 07/02/2018  Time spent: 40 mins  Recommendations for Outpatient Follow-up:  1. PCP  Discharge Diagnoses:  Active Hospital Problems   Diagnosis Date Noted  . AKI (acute kidney injury) ([place]) 06/28/2018  . Watery diarrhea 06/28/2018  . Diabetes mellitus without complication ([place]) 60/73/7106  . Essential hypertension 04/22/2017    Resolved Hospital Problems  No resolved problems to display.    Discharge Condition: Stable  Diet recommendation: Heart healthy, diary free  Vitals:   07/01/18 1505 07/02/18 0410  BP: (!) 145/61 (!) 130/59  Pulse: 66 64  Resp: 20 18  Temp: 98.1 F (36.7 C) 98.7 F (37.1 C)  SpO2: 98% 95%    History of present illness:  82 year old female with medical history significant for type 2 diabetes, anxiety, GERD, hypertension, IBS presented to the ER with worsening diarrhea for the past 5 days with multiple, watery, non-bloody episodes per day.  Denies any significant abdominal pain/nausea/vomiting.  Reported husband had diarrhea a few weeks ago but resolved spontaneously and was not as bad as has.  Patient denies any recent travel or any antibiotic use.  Patient has been following with [facility] and has been diagnosed with irritable bowel syndrome (reports alternating diarrhea with constipation).  In the ED, patient noted to have significant AKI.  Patient admitted for further management.   Today, patient reported feeling better. Had 1 episode of diarrhea overnight which was more formed. Denies any abdominal pain, N/V, chest pain, fever/chills. [name], all questions answered.  Hospital Course:  Active Problems:   Essential hypertension   Diabetes mellitus without complication ([place])   AKI (acute kidney injury) ([place])   Watery diarrhea  Acute gastroenteritis 2/2 Enteropathogenic E.coli Improved, diarrhea  resolved Afebrile, no leukocytosis History of irritable bowel syndrome (reports alternating episodes of diarrhea with constipation). Follows with [facility] for IBS C. difficile negative, stool panel revealed E.coli CT abdomen/pelvis showed liquid stool throughout the colon consistent with the clinical history of diarrhea, no evidence of bowel obstruction.  Sigmoid diverticulosis without evidence of diverticulitis Completed IV Azithromycin X 3 doses Advised to stay hydrated  Hx of IBS Continue home bentyl prn  AKI Improving Likely due to above--> dehydration Normal baseline creatinine, 2.53 on admission-->1.15 S/P IV fluids, encouraged to stay hydrated Follow up with PCP with repeat labs  Hypertension BP stable Continue to home ARB  Type 2 diabetes Last A1c 6.3 Continue home metformin  Anxiety Continue Ativan as needed  GERD Continue PPI  Hyperlipidemia Continue home meds   Procedures:  None   Consultations:  None  Discharge Exam: BP (!) 130/59 (BP Location: Left Arm)   Pulse 64   Temp 98.7 F (37.1 C) (Oral)   Resp 18   Ht 5\' 3"  (1.6 m)   Wt 85.7 kg   SpO2 95%   BMI 33.47 kg/m   General: NAD Cardiovascular: S1, S2 present Respiratory: CTAB   Discharge Instructions You were cared for by a hospitalist during your hospital stay. If you have any questions about your discharge medications or the care you received while you were in the hospital after you are discharged, you can call the unit and asked to speak with the hospitalist on call if the hospitalist that took care of you is not available. Once you are discharged, your primary care physician will handle any further medical issues. Please note that NO REFILLS for  any discharge medications will be authorized once you are discharged, as it is imperative that you return to your primary care physician (or establish a relationship with a primary care physician if you do not have one) for your aftercare  needs so that they can reassess your need for medications and monitor your lab values.   Allergies as of 07/02/2018   No Known Allergies     Medication List    STOP taking these medications   omeprazole 10 MG capsule Commonly known as:  PRILOSEC Replaced by:  pantoprazole 20 MG tablet   PRILOSEC PO     TAKE these medications   aspirin EC 81 MG tablet Take 81 mg by mouth daily.   dicyclomine 10 MG capsule Commonly known as:  BENTYL Take 1 capsule (10 mg total) by mouth 3 (three) times daily as needed for spasms. What changed:    how much to take  how to take this  when to take this  reasons to take this  additional instructions   ezetimibe-simvastatin 10-20 MG tablet Commonly known as:  VYTORIN Take 1 tablet by mouth daily.   LORazepam 1 MG tablet Commonly known as:  ATIVAN Take 1 tablet (1 mg total) by mouth daily as needed for anxiety.   metFORMIN 500 MG tablet Commonly known as:  GLUCOPHAGE TAKE 1 TABLET TWICE A DAY WITH MEALS   pantoprazole 20 MG tablet Commonly known as:  PROTONIX Take 1 tablet (20 mg total) by mouth daily. Start taking on:  07/03/2018 Replaces:  omeprazole 10 MG capsule   valsartan 160 MG tablet Commonly known as:  DIOVAN Take 1 tablet (160 mg total) by mouth daily. PATIENT MUST HAVE OFFICE VISIT PRIOR TO ANY FURTHER REFILLS   zolpidem 5 MG tablet Commonly known as:  AMBIEN Take 1 tablet (5 mg total) by mouth at bedtime as needed for sleep.      No Known Allergies Follow-up Information    Danford, Berna Spare, NP. Schedule an appointment as soon as possible for a visit in 1 week(s).   Specialty:  Family Medicine Contact information: [place] [address] [phone]            The results of significant diagnostics from this hospitalization (including imaging, microbiology, ancillary and laboratory) are listed below for reference.    Significant Diagnostic Studies: Ct Abdomen Pelvis Wo  Contrast  Result Date: 06/28/2018 CLINICAL DATA:  Diarrhea over the last 4 days.  Elevated creatinine. EXAM: CT ABDOMEN AND PELVIS WITHOUT CONTRAST TECHNIQUE: Multidetector CT imaging of the abdomen and pelvis was performed following the standard protocol without IV contrast. COMPARISON:  None. FINDINGS: Lower chest: Normal except for a small calcified granuloma in the right lower lobe. Hepatobiliary: No liver parenchymal abnormality is seen. Previous cholecystectomy. Pancreas: Normal Spleen: Normal Adrenals/Urinary Tract: Adrenal glands are normal. The right kidney is normal. Left kidney is normal except for a 2.7 cm cyst in the lateral midportion. No hydronephrosis. No bladder abnormality seen. Stomach/Bowel: No sign of bowel obstruction. Liquid stool noted within the colon consistent with the clinical history of diarrhea. Sigmoid diverticulosis without imaging evidence of diverticulitis presently. No sign of bowel wall thickening. Vascular/Lymphatic: Aortic atherosclerosis. No aneurysm. IVC is normal. No retroperitoneal adenopathy. Reproductive: Previous hysterectomy.  No pelvic mass. Other: No free fluid or air. Musculoskeletal: Ordinary chronic lower lumbar degenerative changes. IMPRESSION: Liquid stool throughout the colon consistent with the clinical history of diarrhea. No evidence of bowel obstruction or convincing bowel wall edema.  Previous cholecystectomy.  Previous hysterectomy. Aortic atherosclerosis. Left renal cyst. Sigmoid diverticulosis without evidence of diverticulitis. Low level diverticulitis can be inapparent at imaging. Electronically Signed   By: Nelson Chimes M.Brooks.   On: 06/28/2018 14:53    Microbiology: Recent Results (from the past 240 hour(s))  C difficile quick scan w PCR reflex     Status: None   Collection Time: 06/28/18  3:35 PM  Result Value Ref Range Status   C Diff antigen NEGATIVE NEGATIVE Final   C Diff toxin NEGATIVE NEGATIVE Final   C Diff interpretation No C.  difficile detected.  Final    Comment: Performed at [facility], [place] [address] [place], [place] [address]  Gastrointestinal Panel by PCR , Stool     Status: Abnormal   Collection Time: 06/28/18  3:35 PM  Result Value Ref Range Status   Campylobacter species NOT DETECTED NOT DETECTED Final   Plesimonas shigelloides NOT DETECTED NOT DETECTED Final   Salmonella species NOT DETECTED NOT DETECTED Final   Yersinia enterocolitica NOT DETECTED NOT DETECTED Final   Vibrio species NOT DETECTED NOT DETECTED Final   Vibrio cholerae NOT DETECTED NOT DETECTED Final   Enteroaggregative E coli (EAEC) NOT DETECTED NOT DETECTED Final   Enteropathogenic E coli (EPEC) DETECTED (A) NOT DETECTED Final    Comment: RESULT CALLED TO, READ BACK BY AND VERIFIED WITH: THEHAITSE GEBRU @2041  06/29/18 AKT    Enterotoxigenic E coli (ETEC) NOT DETECTED NOT DETECTED Final   Shiga like toxin producing E coli (STEC) NOT DETECTED NOT DETECTED Final   Shigella/Enteroinvasive E coli (EIEC) NOT DETECTED NOT DETECTED Final   Cryptosporidium NOT DETECTED NOT DETECTED Final   Cyclospora cayetanensis NOT DETECTED NOT DETECTED Final   Entamoeba histolytica NOT DETECTED NOT DETECTED Final   Giardia lamblia NOT DETECTED NOT DETECTED Final   Adenovirus F40/41 NOT DETECTED NOT DETECTED Final   Astrovirus NOT DETECTED NOT DETECTED Final   Norovirus GI/GII NOT DETECTED NOT DETECTED Final   Rotavirus A NOT DETECTED NOT DETECTED Final   Sapovirus (I, II, IV, and V) NOT DETECTED NOT DETECTED Final    Comment: Performed at [facility], [address] [place], [place] [address]     Labs: Basic Metabolic Panel: Recent Labs  Lab 06/28/18 1134 06/28/18 1745 06/29/18 0613 06/30/18 0548 07/01/18 0558 07/02/18 0611  NA 138  --  141 139 142 139  K 3.9  --  4.0 4.0 3.9 3.7  CL 105  --  110 109 111 112*  CO2 22  --  22 22 22  20*  GLUCOSE 114*  --  124* 107* 109* 105*  BUN 25*  --  26* 24* 19 14  CREATININE 2.53*  2.54* 2.32* 1.74* 1.51* 1.15*  CALCIUM 8.7*  --  8.8* 8.4* 8.2* 7.9*  MG  --   --  1.6* 1.9  --   --    Liver Function Tests: Recent Labs  Lab 06/28/18 1134 06/29/18 0613  AST 29 23  ALT 20 17  ALKPHOS 61 56  BILITOT 0.5 0.7  PROT 7.5 6.3*  ALBUMIN 3.6 3.1*   Recent Labs  Lab 06/28/18 1134  LIPASE 29   No results for input(s): AMMONIA in the last 168 hours. CBC: Recent Labs  Lab 06/28/18 1134 06/28/18 1745 06/29/18 0613 06/30/18 0548  WBC 5.6 5.4 4.9 7.0  NEUTROABS 3.4  --   --  5.1  HGB 13.1 11.6* 11.8* 10.6*  HCT 39.3 35.5* 35.8* 31.6*  MCV 86.9 88.1 87.3 86.6  PLT 172 173 167 189   Cardiac Enzymes: No results for input(s): CKTOTAL, CKMB, CKMBINDEX, TROPONINI in the last 168 hours. BNP: BNP (last 3 results) No results for input(s): BNP in the last 8760 hours.  ProBNP (last 3 results) No results for input(s): PROBNP in the last 8760 hours.  CBG: Recent Labs  Lab 07/01/18 1226 07/01/18 1620 07/01/18 2003 07/02/18 0740 07/02/18 1141  GLUCAP 118* 116* 160* 112* 107*       Signed:  Alma Friendly, MD [facility] 07/02/2018, 12:03 PM

## 2018-07-03 ENCOUNTER — Observation Stay (HOSPITAL_BASED_OUTPATIENT_CLINIC_OR_DEPARTMENT_OTHER): Payer: Medicare Other

## 2018-07-03 ENCOUNTER — Other Ambulatory Visit: Payer: Self-pay

## 2018-07-03 DIAGNOSIS — I10 Essential (primary) hypertension: Secondary | ICD-10-CM

## 2018-07-03 DIAGNOSIS — R7989 Other specified abnormal findings of blood chemistry: Secondary | ICD-10-CM | POA: Diagnosis not present

## 2018-07-03 DIAGNOSIS — R748 Abnormal levels of other serum enzymes: Secondary | ICD-10-CM

## 2018-07-03 DIAGNOSIS — I5033 Acute on chronic diastolic (congestive) heart failure: Secondary | ICD-10-CM | POA: Diagnosis not present

## 2018-07-03 DIAGNOSIS — I5031 Acute diastolic (congestive) heart failure: Secondary | ICD-10-CM | POA: Diagnosis not present

## 2018-07-03 DIAGNOSIS — E119 Type 2 diabetes mellitus without complications: Secondary | ICD-10-CM | POA: Diagnosis not present

## 2018-07-03 DIAGNOSIS — I130 Hypertensive heart and chronic kidney disease with heart failure and stage 1 through stage 4 chronic kidney disease, or unspecified chronic kidney disease: Secondary | ICD-10-CM | POA: Diagnosis not present

## 2018-07-03 DIAGNOSIS — I2510 Atherosclerotic heart disease of native coronary artery without angina pectoris: Secondary | ICD-10-CM | POA: Diagnosis not present

## 2018-07-03 DIAGNOSIS — I509 Heart failure, unspecified: Secondary | ICD-10-CM | POA: Diagnosis not present

## 2018-07-03 DIAGNOSIS — E7800 Pure hypercholesterolemia, unspecified: Secondary | ICD-10-CM

## 2018-07-03 DIAGNOSIS — I340 Nonrheumatic mitral (valve) insufficiency: Secondary | ICD-10-CM

## 2018-07-03 DIAGNOSIS — I214 Non-ST elevation (NSTEMI) myocardial infarction: Secondary | ICD-10-CM

## 2018-07-03 LAB — BASIC METABOLIC PANEL
ANION GAP: 11 (ref 5–15)
BUN: 11 mg/dL (ref 8–23)
CALCIUM: 8.4 mg/dL — AB (ref 8.9–10.3)
CHLORIDE: 104 mmol/L (ref 98–111)
CO2: 25 mmol/L (ref 22–32)
CREATININE: 1.2 mg/dL — AB (ref 0.44–1.00)
GFR calc non Af Amer: 42 mL/min — ABNORMAL LOW (ref >60)
GFR, EST AFRICAN AMERICAN: 48 mL/min — AB (ref >60)
Glucose, Bld: 123 mg/dL — ABNORMAL HIGH (ref 70–99)
Potassium: 3.3 mmol/L — ABNORMAL LOW (ref 3.5–5.1)
SODIUM: 140 mmol/L (ref 135–145)

## 2018-07-03 LAB — ECHOCARDIOGRAM COMPLETE: WEIGHTICAEL: 2982.4 [oz_av]

## 2018-07-03 LAB — GLUCOSE, CAPILLARY
GLUCOSE-CAPILLARY: 124 mg/dL — AB (ref 70–99)
GLUCOSE-CAPILLARY: 147 mg/dL — AB (ref 70–99)
GLUCOSE-CAPILLARY: 188 mg/dL — AB (ref 70–99)
Glucose-Capillary: 106 mg/dL — ABNORMAL HIGH (ref 70–99)
Glucose-Capillary: 142 mg/dL — ABNORMAL HIGH (ref 70–99)

## 2018-07-03 LAB — HEPARIN LEVEL (UNFRACTIONATED): Heparin Unfractionated: 0.13 IU/mL — ABNORMAL LOW (ref 0.30–0.70)

## 2018-07-03 LAB — PROTIME-INR
INR: 1.28
Prothrombin Time: 15.9 seconds — ABNORMAL HIGH (ref 11.4–15.2)

## 2018-07-03 LAB — TROPONIN I
TROPONIN I: 0.1 ng/mL — AB (ref <0.03)
TROPONIN I: 0.13 ng/mL — AB (ref <0.03)
Troponin I: 0.11 ng/mL (ref <0.03)

## 2018-07-03 LAB — APTT: aPTT: 37 seconds — ABNORMAL HIGH (ref 24–36)

## 2018-07-03 MED ORDER — METOPROLOL TARTRATE 25 MG PO TABS
25.0000 mg | ORAL_TABLET | Freq: Two times a day (BID) | ORAL | Status: DC
  Administered 2018-07-03 (×2): 25 mg via ORAL
  Filled 2018-07-03 (×2): qty 1

## 2018-07-03 MED ORDER — SODIUM CHLORIDE 0.9% FLUSH
3.0000 mL | Freq: Two times a day (BID) | INTRAVENOUS | Status: DC
  Administered 2018-07-03 – 2018-07-04 (×2): 3 mL via INTRAVENOUS

## 2018-07-03 MED ORDER — POTASSIUM CHLORIDE CRYS ER 20 MEQ PO TBCR
40.0000 meq | EXTENDED_RELEASE_TABLET | Freq: Once | ORAL | Status: AC
  Administered 2018-07-03: 40 meq via ORAL
  Filled 2018-07-03: qty 2

## 2018-07-03 MED ORDER — ATORVASTATIN CALCIUM 40 MG PO TABS
40.0000 mg | ORAL_TABLET | Freq: Every day | ORAL | Status: DC
  Administered 2018-07-03: 40 mg via ORAL
  Filled 2018-07-03: qty 1

## 2018-07-03 MED ORDER — SODIUM CHLORIDE 0.9% FLUSH: 3.0000 mL | INTRAVENOUS | Status: DC | PRN

## 2018-07-03 MED ORDER — HEPARIN BOLUS VIA INFUSION
3500.0000 [IU] | Freq: Once | INTRAVENOUS | Status: AC
  Administered 2018-07-03: 3500 [IU] via INTRAVENOUS
  Filled 2018-07-03: qty 3500

## 2018-07-03 MED ORDER — ASPIRIN 81 MG PO CHEW
81.0000 mg | CHEWABLE_TABLET | ORAL | Status: AC
  Administered 2018-07-04: 81 mg via ORAL
  Filled 2018-07-03: qty 1

## 2018-07-03 MED ORDER — NITROGLYCERIN 2 % TD OINT
0.5000 [in_us] | TOPICAL_OINTMENT | Freq: Three times a day (TID) | TRANSDERMAL | Status: DC
  Administered 2018-07-03 – 2018-07-05 (×4): 0.5 [in_us] via TOPICAL
  Filled 2018-07-03: qty 30

## 2018-07-03 MED ORDER — SODIUM CHLORIDE 0.9 % IV SOLN: 250.0000 mL | INTRAVENOUS | Status: DC | PRN

## 2018-07-03 MED ORDER — HEPARIN (PORCINE) IN NACL 100-0.45 UNIT/ML-% IJ SOLN
850.0000 [IU]/h | INTRAMUSCULAR | Status: DC
  Administered 2018-07-03: 850 [IU]/h via INTRAVENOUS
  Filled 2018-07-03: qty 250

## 2018-07-03 MED ORDER — SODIUM CHLORIDE 0.9 % IV SOLN
INTRAVENOUS | Status: DC
  Administered 2018-07-04: 06:00:00 via INTRAVENOUS

## 2018-07-03 MED ORDER — HEPARIN (PORCINE) IN NACL 100-0.45 UNIT/ML-% IJ SOLN
1100.0000 [IU]/h | INTRAMUSCULAR | Status: DC
  Administered 2018-07-04: 1100 [IU]/h via INTRAVENOUS
  Filled 2018-07-03: qty 250

## 2018-07-03 MED ORDER — HEPARIN BOLUS VIA INFUSION
2000.0000 [IU] | Freq: Once | INTRAVENOUS | Status: AC
  Administered 2018-07-03: 2000 [IU] via INTRAVENOUS
  Filled 2018-07-03: qty 2000

## 2018-07-03 MED ORDER — MAGNESIUM SULFATE 2 GM/50ML IV SOLN
2.0000 g | Freq: Once | INTRAVENOUS | Status: AC
Start: 2018-07-03 — End: 2018-07-03
  Administered 2018-07-03: 2 g via INTRAVENOUS
  Filled 2018-07-03: qty 50

## 2018-07-03 NOTE — Progress Notes (Signed)
Nurse notified me of CP - has 2 types - 1 is SSCP, other is left chest wall soreness under left breast/axilla from where echo tech had to push probe to get images. VSS, mildly hypertensive today per chart. 2 SL NTG resolved the SSCP. EKG nonacute (reviewed with Dr. Aundra Dubin). Continue IV heparin per pharmacy. Add NTG paste. Plan cath tomorrow as outlined in chart. If anginal-type CP recurs and unable to get pain free or develops evolving EKG changes, will need to expedite cath. D/w nurse. Damaree Sargent PA-C

## 2018-07-03 NOTE — Progress Notes (Signed)
Nitro paste removed at 10:00pm per orders. Will continue to monitor patient.

## 2018-07-03 NOTE — Progress Notes (Signed)
RN in room with patient who appeared very tired, had been sitting up in chair for hours.  This RN helped patient back to bed, upon laying down flat patient immediately c/o chest pain rated at a 9/10, some shortness of breath.  Nitro given sublingual at 1407, reduced pain to 8.  Second nitro given, pain gone per patient.  Cardiology pa notified of above, orders received.  Will continue to monitor.

## 2018-07-03 NOTE — Progress Notes (Signed)
  Echocardiogram 2D Echocardiogram has been performed.  Andrea Brooks 07/03/2018, 8:27 AM

## 2018-07-03 NOTE — Progress Notes (Signed)
Troponi in ER tonight was .14 - Lab reports her last Troponi is .13.  Pt remains asymptomatic, VSS, diuresing well.  Next Troponi will be at 0700.   She remains SR in the 80's.   [facility] on call has been notified.

## 2018-07-03 NOTE — Progress Notes (Addendum)
PROGRESS NOTE  Andrea Brooks NTI:[id number] DOB: Mar 11, 1936 DOA: 07/02/2018 PCP: Esaw Grandchild, NP  HPI/Recap of past 24 hours: Andrea Brooks is a 82 y.o. female with medical history significant of DM2, HTN.  Patient was recently admitted to our service on 8/6, discharged earlier today due to AKI associated with E.Coli diarrhea.  Diarrhea and AKI resolved (creat back down to 1.1, and one formed stool this AM, none since today).  However, patient had SOB, chest tightness at home.  She remains off of Losartan at home which was held for AKI during admit.  Presents to ED. Trop 0.14, BNP 345.  CTA chest shows no PE, shows small B pleural effusions.  07/03/2018: Patient seen and examined at her bedside.  Reports intermittent moderate chest pain substernal nonradiating.  Denies dyspnea or palpitations.  Assessment/Plan: Principal Problem:   Acute diastolic CHF (congestive heart failure) (HCC) Active Problems:   Essential hypertension   Diabetes mellitus without complication ([facility])   Elevated troponin   Acute congestive heart failure (HCC)   Non-ST elevation (NSTEMI) myocardial infarction (HCC)  Acute diastolic CHF exacerbation 2D echo done on 07/03/2018 revealed LVEF 60 to 65% with normal wall motion Cardiology consulted and following Strict I's and O's and daily weight Cardiac meds per cardiology  Elevated troponin most likely demand ischemia Peaked at 0.14 and trended down Personally reviewed twelve-lead EKG done on 07/02/2018 which revealed sinus rhythm rate of 77 with no specific ST-T changes. Continue to monitor on telemetry Cardiology recommends cardiac catheterization to define coronary anatomy  Hypokalemia Potassium 3.3 Repleted with p.o. potassium supplement and IV magnesium once  Hypertension Resume home medications Continue Irbesartan Start Lopressor 25 twice daily per cardiology  Hyperlipidemia Resume home medications Start Lipitor 40 mg daily per  cardiology  Obesity Weight loss outpatient  CKD 3 Creatinine appears to be at baseline GFR 42 Avoid nephrotoxic agent/hypotension/dehydration Repeat BMP in the morning     Code Status: Full  Family Communication: None bedside  Disposition Plan: Home when cardiology signs off   Consultants:  Cardiology  Procedures:  None  Antimicrobials: None  DVT prophylaxis: Heparin drip   Objective: Vitals:   07/03/18 0200 07/03/18 0457 07/03/18 0500 07/03/18 1013  BP: (!) 154/60 (!) 138/59  (!) 153/64  Pulse: 86 78  84  Resp: 18 16    Temp:  98.4 F (36.9 C)    TempSrc:  Oral    SpO2:  96%  97%  Weight:   84.6 kg     Intake/Output Summary (Last 24 hours) at 07/03/2018 1236 Last data filed at 07/03/2018 1025 Gross per 24 hour  Intake 720 ml  Output 4050 ml  Net -3330 ml   Filed Weights   07/03/18 0500  Weight: 84.6 kg    Exam:  . General: 82 y.o. year-old female well developed well nourished in no acute distress.  Alert and oriented x3. . Cardiovascular: Regular rate and rhythm with no rubs or gallops.  No thyromegaly or JVD noted.   Marland Kitchen Respiratory: Clear to auscultation with no wheezes or rales. Good inspiratory effort. . Abdomen: Soft nontender nondistended with normal bowel sounds x4 quadrants. . Musculoskeletal: No lower extremity edema. 2/4 pulses in all 4 extremities. . Skin: No ulcerative lesions noted or rashes, . Psychiatry: Mood is appropriate for condition and setting   Data Reviewed: CBC: Recent Labs  Lab 06/28/18 1134 06/28/18 1745 06/29/18 0613 06/30/18 0548 07/02/18 2106  WBC 5.6 5.4 4.9 7.0 10.5  NEUTROABS 3.4  --   --  5.1 8.7*  HGB 13.1 11.6* 11.8* 10.6* 12.0  HCT 39.3 35.5* 35.8* 31.6* 35.9*  MCV 86.9 88.1 87.3 86.6 85.7  PLT 172 173 167 189 381   Basic Metabolic Panel: Recent Labs  Lab 06/29/18 0613 06/30/18 0548 07/01/18 0558 07/02/18 0611 07/02/18 2106 07/03/18 0657  NA 141 139 142 139 140 140  K 4.0 4.0 3.9 3.7 3.7  3.3*  CL 110 109 111 112* 107 104  CO2 22 22 22  20* 22 25  GLUCOSE 124* 107* 109* 105* 143* 123*  BUN 26* 24* 19 14 12 11   CREATININE 2.32* 1.74* 1.51* 1.15* 1.10* 1.20*  CALCIUM 8.8* 8.4* 8.2* 7.9* 8.4* 8.4*  MG 1.6* 1.9  --   --   --   --    GFR: Estimated Creatinine Clearance: 39.2 mL/min (A) (by C-G formula based on SCr of 1.2 mg/dL (H)). Liver Function Tests: Recent Labs  Lab 06/28/18 1134 06/29/18 0613 07/02/18 2106  AST 29 23 28   ALT 20 17 19   ALKPHOS 61 56 60  BILITOT 0.5 0.7 0.3  PROT 7.5 6.3* 7.2  ALBUMIN 3.6 3.1* 3.4*   Recent Labs  Lab 06/28/18 1134  LIPASE 29   No results for input(s): AMMONIA in the last 168 hours. Coagulation Profile: Recent Labs  Lab 07/03/18 0954  INR 1.28   Cardiac Enzymes: Recent Labs  Lab 07/02/18 2106 07/03/18 0050 07/03/18 0657  TROPONINI 0.14* 0.13* 0.11*   BNP (last 3 results) No results for input(s): PROBNP in the last 8760 hours. HbA1C: No results for input(s): HGBA1C in the last 72 hours. CBG: Recent Labs  Lab 07/02/18 0740 07/02/18 1141 07/03/18 0029 07/03/18 0721 07/03/18 1138  GLUCAP 112* 107* 124* 106* 188*   Lipid Profile: No results for input(s): CHOL, HDL, LDLCALC, TRIG, CHOLHDL, LDLDIRECT in the last 72 hours. Thyroid Function Tests: No results for input(s): TSH, T4TOTAL, FREET4, T3FREE, THYROIDAB in the last 72 hours. Anemia Panel: No results for input(s): VITAMINB12, FOLATE, FERRITIN, TIBC, IRON, RETICCTPCT in the last 72 hours. Urine analysis:    Component Value Date/Time   COLORURINE YELLOW 06/28/2018 1340   APPEARANCEUR CLEAR 06/28/2018 1340   LABSPEC <1.005 (L) 06/28/2018 1340   PHURINE 6.0 06/28/2018 1340   GLUCOSEU NEGATIVE 06/28/2018 1340   HGBUR NEGATIVE 06/28/2018 1340   BILIRUBINUR NEGATIVE 06/28/2018 1340   KETONESUR NEGATIVE 06/28/2018 1340   PROTEINUR NEGATIVE 06/28/2018 1340   NITRITE NEGATIVE 06/28/2018 1340   LEUKOCYTESUR NEGATIVE 06/28/2018 1340   Sepsis  Labs: @LABRCNTIP (procalcitonin:4,lacticidven:4)  ) Recent Results (from the past 240 hour(s))  C difficile quick scan w PCR reflex     Status: None   Collection Time: 06/28/18  3:35 PM  Result Value Ref Range Status   C Diff antigen NEGATIVE NEGATIVE Final   C Diff toxin NEGATIVE NEGATIVE Final   C Diff interpretation No C. difficile detected.  Final    Comment: Performed at [facility], [place] [address] [place], [place] [address]  Gastrointestinal Panel by PCR , Stool     Status: Abnormal   Collection Time: 06/28/18  3:35 PM  Result Value Ref Range Status   Campylobacter species NOT DETECTED NOT DETECTED Final   Plesimonas shigelloides NOT DETECTED NOT DETECTED Final   Salmonella species NOT DETECTED NOT DETECTED Final   Yersinia enterocolitica NOT DETECTED NOT DETECTED Final   Vibrio species NOT DETECTED NOT DETECTED Final   Vibrio cholerae NOT DETECTED NOT DETECTED Final   Enteroaggregative E coli (EAEC) NOT DETECTED  NOT DETECTED Final   Enteropathogenic E coli (EPEC) DETECTED (A) NOT DETECTED Final    Comment: RESULT CALLED TO, READ BACK BY AND VERIFIED WITH: THEHAITSE GEBRU @2041  06/29/18 AKT    Enterotoxigenic E coli (ETEC) NOT DETECTED NOT DETECTED Final   Shiga like toxin producing E coli (STEC) NOT DETECTED NOT DETECTED Final   Shigella/Enteroinvasive E coli (EIEC) NOT DETECTED NOT DETECTED Final   Cryptosporidium NOT DETECTED NOT DETECTED Final   Cyclospora cayetanensis NOT DETECTED NOT DETECTED Final   Entamoeba histolytica NOT DETECTED NOT DETECTED Final   Giardia lamblia NOT DETECTED NOT DETECTED Final   Adenovirus F40/41 NOT DETECTED NOT DETECTED Final   Astrovirus NOT DETECTED NOT DETECTED Final   Norovirus GI/GII NOT DETECTED NOT DETECTED Final   Rotavirus A NOT DETECTED NOT DETECTED Final   Sapovirus (I, II, IV, and V) NOT DETECTED NOT DETECTED Final    Comment: Performed at [facility], [address] [place], [place] [address]       Studies: Dg Chest 2 View  Result Date: 07/02/2018 CLINICAL DATA:  Shortness of breath. EXAM: CHEST - 2 VIEW COMPARISON:  None FINDINGS: Mild cardiac enlargement. Pulmonary vascular congestion. No airspace opacities. The visualized skeletal structures are unremarkable. IMPRESSION: 1. Cardiac enlargement and pulmonary vascular congestion. Electronically Signed   By: Kerby Moors M.D.   On: 07/02/2018 20:48   Ct Angio Chest Pe W/cm &/or Wo Cm  Result Date: 07/02/2018 CLINICAL DATA:  Intermediate probability for pulmonary embolus. Mid sternal chest pain. EXAM: CT ANGIOGRAPHY CHEST WITH CONTRAST TECHNIQUE: Multidetector CT imaging of the chest was performed using the standard protocol during bolus administration of intravenous contrast. Multiplanar CT image reconstructions and MIPs were obtained to evaluate the vascular anatomy. CONTRAST:  4mL ISOVUE-370 IOPAMIDOL (ISOVUE-370) INJECTION 76% COMPARISON:  None. FINDINGS: Cardiovascular: Moderate cardiac enlargement. No pericardial effusion. Aortic atherosclerosis. Calcification in the LAD and RCA coronary artery noted. The main pulmonary artery appears patent. No saddle embolus. No lobar or segmental pulmonary artery filling defects identified. Mediastinum/Nodes: Normal appearance of the thyroid gland. There is diminished AP diameter of the trachea which may be a manifestation of tracheobronchomalacia. Normal appearance of the esophagus. No supraclavicular, axillary or mediastinal adenopathy no hilar adenopathy. Lungs/Pleura: Small bilateral pleural effusions noted, left greater than right. Subsegmental atelectasis within the left lower lobe. Mosaic attenuation pattern is identified bilaterally which may be a manifestation of small airways disease. Upper Abdomen: No acute abnormality. Musculoskeletal: No chest wall abnormality. No acute or significant osseous findings. Review of the MIP images confirms the above findings. IMPRESSION: 1. No evidence for  acute pulmonary embolus. 2. Small bilateral pleural effusions left greater than right. 3. Multi vessel coronary artery atherosclerotic calcifications. Aortic Atherosclerosis (ICD10-I70.0). Electronically Signed   By: Kerby Moors M.D.   On: 07/02/2018 22:45    Scheduled Meds: . [START ON 07/04/2018] aspirin  81 mg Oral Pre-Cath  . aspirin EC  81 mg Oral Daily  . atorvastatin  40 mg Oral q1800  . insulin aspart  0-9 Units Subcutaneous TID WC  . irbesartan  150 mg Oral Daily  . metoprolol tartrate  25 mg Oral BID  . pantoprazole  20 mg Oral Daily  . sodium chloride flush  3 mL Intravenous Q12H  . sodium chloride flush  3 mL Intravenous Q12H    Continuous Infusions: . sodium chloride    . sodium chloride    . [START ON 07/04/2018] sodium chloride    . heparin 850 Units/hr (07/03/18  1019)     LOS: 0 days     Kayleen Memos, MD [facility] Pager [phone]  If 7PM-7AM, please contact night-coverage [email or web address] Password [email or web address] 07/03/2018, 12:36 PM

## 2018-07-03 NOTE — Progress Notes (Signed)
ANTICOAGULATION CONSULT NOTE - Initial Consult  Pharmacy Consult for heparin drip Indication: chest pain/ACS  No Known Allergies  Patient Measurements: Weight: 186 lb 6.4 oz (84.6 kg) Heparin Dosing Weight: 72 kg  Vital Signs: Temp: 98.4 F (36.9 C) (08/11 0457) Temp Source: Oral (08/11 0457) BP: 138/59 (08/11 0457) Pulse Rate: 78 (08/11 0457)  Labs: Recent Labs    07/02/18 0611 07/02/18 2106 07/03/18 0050 07/03/18 0657  HGB  --  12.0  --   --   HCT  --  35.9*  --   --   PLT  --  220  --   --   CREATININE 1.15* 1.10*  --  1.20*  TROPONINI  --  0.14* 0.13* 0.11*    Estimated Creatinine Clearance: 39.2 mL/min (A) (by C-G formula based on SCr of 1.2 mg/dL (H)).   Medical History: Past Medical History:  Diagnosis Date  . Anxiety   . Colon polyps   . Diabetes mellitus without complication ([place])   . Diverticulosis   . Gallstones   . GERD (gastroesophageal reflux disease)   . Hyperlipidemia   . Hypertension   . Obesity     Assessment: Patient's PMH significant for T2DM, HTN, HLD - hospitalized last week with AKI and E.coli diarrhea. Patient admitted with SOB and chest tightness. Cardiology following, planning for cardiac cath.   Patient was not taking any anticoagulants prior to admission, but did receive enoxaparin 40 mg subQ ~0100 this morning.  Baseline labs ordered  Today, 07/03/18  Hgb 12  Plt 220  Troponin: 0.14 --> 0.13 --> 0.11  Received enoxaparin 40 mg subQ at 0100 this morning. Has been discontinued  Goal of Therapy:  Heparin level 0.3-0.7 units/ml Monitor platelets by anticoagulation protocol: Yes   Plan:  Give 3500 units bolus x 1 (slightly lower bolus as pt received enoxaparin dose overnight) Start heparin infusion at 850 units/hr Check anti-Xa level in 8 hours and daily while on heparin Continue to monitor H&H and platelets  Lenis Noon, PharmD Clinical Pharmacist 07/03/2018,9:53 AM

## 2018-07-03 NOTE — Progress Notes (Addendum)
Admission weight at 0001 was incorrect.   Much care taken with this AM's standing weight.   Pt has voided 2800 cc's plus 1 uncollected urine since admission 6 hours ago.  Her weight this AM is 84.55 kgs.  Her weight prior to discharge on 8/10 was 85.7 Kgs.

## 2018-07-03 NOTE — Consult Note (Signed)
Admit date: 07/02/2018 Referring Physician Dr. Nevada Crane Primary Physician  Dr. Mina Marble Primary Cardiologist None - New Reason for Consultation  CHF  HPI: Andrea Brooks is a 82 y.o. female who is being seen today for the evaluation of CHF at the request of Dr. Nevada Crane.  This is a 82 year old female with a history of type 2 diabetes mellitus, hypertension, GERD, hyperlipidemia and obesity who was hospitalized last week with AKI associated with E. coli diarrhea.  This resolved and creatinine at discharge was 1.1.  While at home the patient started having shortness of breath and chest tightness with radiation to her shoulders bilaterally and presented to the emergency room.  She was noted to have a troponin of 0.14 and a BNP of 345.  Chest CTA showed no evidence of PE but did show small bilateral pleural effusions.  There was also noted to be calcifications of the LAD and RCA with moderate cardiac enlargement on the chest CT.  Cardiology is now asked to evaluate.  EKG showed normal sinus rhythm with poor R wave progression in the anterior precordial leads but no acute ST changes.  2D echocardiogram is pending.       PMH:   Past Medical History:  Diagnosis Date  . Anxiety   . Colon polyps   . Diabetes mellitus without complication ([place])   . Diverticulosis   . Gallstones   . GERD (gastroesophageal reflux disease)   . Hyperlipidemia   . Hypertension   . Obesity      PSH:   Past Surgical History:  Procedure Laterality Date  . ABDOMINAL HYSTERECTOMY     total  . BREAST BIOPSY Left    neg  . CHOLECYSTECTOMY    . JOINT REPLACEMENT Bilateral    knee  . REPLACEMENT TOTAL KNEE BILATERAL      Allergies:  Patient has no known allergies. Prior to Admit Meds:   Medications Prior to Admission  Medication Sig Dispense Refill Last Dose  . acetaminophen (TYLENOL) 500 MG tablet Take 1,000 mg by mouth every 6 (six) hours as needed for moderate pain.   07/02/2018 at Unknown time  . aspirin EC  81 MG tablet Take 81 mg by mouth daily.   07/02/2018 at Unknown time  . dicyclomine (BENTYL) 10 MG capsule Take 1 capsule (10 mg total) by mouth 3 (three) times daily as needed for spasms. 60 capsule 0 07/02/2018 at Unknown time  . ezetimibe-simvastatin (VYTORIN) 10-20 MG tablet Take 1 tablet by mouth daily.   07/02/2018 at Unknown time  . LORazepam (ATIVAN) 1 MG tablet Take 1 tablet (1 mg total) by mouth daily as needed for anxiety. 30 tablet 0 Past Month at Unknown time  . metFORMIN (GLUCOPHAGE) 500 MG tablet TAKE 1 TABLET TWICE A DAY WITH MEALS (Patient taking differently: Take 500 mg by mouth 2 (two) times daily with a meal. ) 180 tablet 0 07/02/2018 at Unknown time  . pantoprazole (PROTONIX) 20 MG tablet Take 1 tablet (20 mg total) by mouth daily. 30 tablet 0 07/02/2018 at Unknown time  . valsartan (DIOVAN) 160 MG tablet Take 1 tablet (160 mg total) by mouth daily. PATIENT MUST HAVE OFFICE VISIT PRIOR TO ANY FURTHER REFILLS (Patient taking differently: Take 160 mg by mouth daily. ) 90 tablet 2 07/02/2018 at Unknown time  . zolpidem (AMBIEN) 5 MG tablet Take 1 tablet (5 mg total) by mouth at bedtime as needed for sleep. 30 tablet 0 07/01/2018 at Unknown time   Fam HX:  Family History  Problem Relation Age of Onset  . Hypertension Mother   . Colon cancer Mother   . Stroke Father   . Heart failure Father   . Stroke Sister   . Stroke Brother   . Graves' disease Daughter   . Cancer Paternal Aunt        breast  . Breast cancer Paternal Aunt   . Graves' disease Sister   . Bone cancer Sister   . Breast cancer Maternal Aunt   . Esophageal cancer Neg Hx   . Rectal cancer Neg Hx   . Liver cancer Neg Hx    Social HX:    Social History   Socioeconomic History  . Marital status: Married    Spouse name: Not on file  . Number of children: 1  . Years of education: Not on file  . Highest education level: Not on file  Occupational History  . Occupation: retired  Scientific laboratory technician  . Financial  resource strain: Not on file  . Food insecurity:    Worry: Not on file    Inability: Not on file  . Transportation needs:    Medical: Not on file    Non-medical: Not on file  Tobacco Use  . Smoking status: Former Smoker    Packs/day: 1.00    Years: 2.00    Pack years: 2.00    Last attempt to quit: 11/24/1959    Years since quitting: 58.6  . Smokeless tobacco: Never Used  Substance and Sexual Activity  . Alcohol use: No  . Drug use: No  . Sexual activity: Not Currently  Lifestyle  . Physical activity:    Days per week: Not on file    Minutes per session: Not on file  . Stress: Not on file  Relationships  . Social connections:    Talks on phone: Not on file    Gets together: Not on file    Attends religious service: Not on file    Active member of club or organization: Not on file    Attends meetings of clubs or organizations: Not on file    Relationship status: Not on file  . Intimate partner violence:    Fear of current or ex partner: Not on file    Emotionally abused: Not on file    Physically abused: Not on file    Forced sexual activity: Not on file  Other Topics Concern  . Not on file  Social History Narrative  . Not on file     ROS:  All  ROS were addressed and are negative except what is stated in the HPI  Physical Exam: Blood pressure (!) 138/59, pulse 78, temperature 98.4 F (36.9 C), temperature source Oral, resp. rate 16, weight 84.6 kg, SpO2 96 %.    General: Well developed, well nourished, in no acute distress Head: Eyes PERRLA, No xanthomas.   Normal cephalic and atramatic  Lungs:   Clear bilaterally to auscultation and percussion. Heart:   HRRR S1 S2 Pulses are 2+ & equal.            No carotid bruit. No JVD.  No abdominal bruits. No femoral bruits. Abdomen: Bowel sounds are positive, abdomen soft and non-tender without masses or                  Hernia's noted. Msk:  Back normal, normal gait. Normal strength and tone for age. Extremities:   No  clubbing, cyanosis or edema.  DP +1 Neuro:  Alert and oriented X 3. Psych:  Good affect, responds appropriately    Labs:   Lab Results  Component Value Date   WBC 10.5 07/02/2018   HGB 12.0 07/02/2018   HCT 35.9 (L) 07/02/2018   MCV 85.7 07/02/2018   PLT 220 07/02/2018    Recent Labs  Lab 07/02/18 2106 07/03/18 0657  NA 140 140  K 3.7 3.3*  CL 107 104  CO2 22 25  BUN 12 11  CREATININE 1.10* 1.20*  CALCIUM 8.4* 8.4*  PROT 7.2  --   BILITOT 0.3  --   ALKPHOS 60  --   ALT 19  --   AST 28  --   GLUCOSE 143* 123*   No results found for: PTT No results found for: INR, PROTIME Lab Results  Component Value Date   TROPONINI 0.11 (HH) 07/03/2018     Lab Results  Component Value Date   CHOL 156 11/04/2017   CHOL 101 07/31/2016   Lab Results  Component Value Date   HDL 44 11/04/2017   HDL 33 (A) 07/31/2016   Lab Results  Component Value Date   LDLCALC 94 11/04/2017   LDLCALC 59 07/31/2016   Lab Results  Component Value Date   TRIG 92 11/04/2017   TRIG 91 07/31/2016   Lab Results  Component Value Date   CHOLHDL 3.5 11/04/2017   No results found for: LDLDIRECT    Radiology:  Dg Chest 2 View  Result Date: 07/02/2018 CLINICAL DATA:  Shortness of breath. EXAM: CHEST - 2 VIEW COMPARISON:  None FINDINGS: Mild cardiac enlargement. Pulmonary vascular congestion. No airspace opacities. The visualized skeletal structures are unremarkable. IMPRESSION: 1. Cardiac enlargement and pulmonary vascular congestion. Electronically Signed   By: Kerby Moors M.D.   On: 07/02/2018 20:48   Ct Angio Chest Pe W/cm &/or Wo Cm  Result Date: 07/02/2018 CLINICAL DATA:  Intermediate probability for pulmonary embolus. Mid sternal chest pain. EXAM: CT ANGIOGRAPHY CHEST WITH CONTRAST TECHNIQUE: Multidetector CT imaging of the chest was performed using the standard protocol during bolus administration of intravenous contrast. Multiplanar CT image reconstructions and MIPs were obtained to  evaluate the vascular anatomy. CONTRAST:  37mL ISOVUE-370 IOPAMIDOL (ISOVUE-370) INJECTION 76% COMPARISON:  None. FINDINGS: Cardiovascular: Moderate cardiac enlargement. No pericardial effusion. Aortic atherosclerosis. Calcification in the LAD and RCA coronary artery noted. The main pulmonary artery appears patent. No saddle embolus. No lobar or segmental pulmonary artery filling defects identified. Mediastinum/Nodes: Normal appearance of the thyroid gland. There is diminished AP diameter of the trachea which may be a manifestation of tracheobronchomalacia. Normal appearance of the esophagus. No supraclavicular, axillary or mediastinal adenopathy no hilar adenopathy. Lungs/Pleura: Small bilateral pleural effusions noted, left greater than right. Subsegmental atelectasis within the left lower lobe. Mosaic attenuation pattern is identified bilaterally which may be a manifestation of small airways disease. Upper Abdomen: No acute abnormality. Musculoskeletal: No chest wall abnormality. No acute or significant osseous findings. Review of the MIP images confirms the above findings. IMPRESSION: 1. No evidence for acute pulmonary embolus. 2. Small bilateral pleural effusions left greater than right. 3. Multi vessel coronary artery atherosclerotic calcifications. Aortic Atherosclerosis (ICD10-I70.0). Electronically Signed   By: Kerby Moors M.D.   On: 07/02/2018 22:45     Telemetry    Normal sinus rhythm- Personally Reviewed  ECG    Normal sinus rhythm with poor R wave progression anterior leads but no acute ST changes- Personally Reviewed   ASSESSMENT/PLAN:   1.  Chest pain/NSTEMI -Worrisome for  possible underlying coronary ischemia -Troponin only minimally elevated with flat trend (0.14>0.13>0.11) and could be related to acute CHF -Chest CT shows coronary artery calcifications in the LAD and RCA. -EKG is nonischemic but does have poor R wave progression in the anterior precordial leads -2D echo  pending -She has multiple cardiac risk factors including her post menopausal state, type 2 diabetes mellitus, hypertension, obesity and remote history of tobacco use -Recommend cardiac catheterization to define coronary anatomy. -Cardiac catheterization was discussed with the patient fully. The patient understands that risks include but are not limited to stroke (1 in 1000), death (1 in 26), kidney failure [usually temporary] (1 in 500), bleeding (1 in 200), allergic reaction [possibly serious] (1 in 200).  The patient understands and is willing to proceed.   -will make NPO after MN for cath in am -Continue aspirin 81 mg daily, statin -Start IV heparin per pharmacy -Start Lopressor 25 mg twice daily  2.  Acute CHF -2D echo pending to assess LV function -BNP mildly elevated at 345 -Chest CT showed small bilateral pleural effusions -She received 40 mg IV Lasix yesterday on admission and put out 3.3 L and is now net -3.2 L.  3.  Type 2 diabetes mellitus -Check HbA1c -Management per TRH  4.  Hypertension -BP borderline controlled at 138-154/59-60 mmHg -Continue ARB -Start Lopressor 25 mg twice daily  5.  Hyperlipidemia -LDL was 94 in December 2018 -Change Vytorin to Lipitor 40 mg daily -Repeat FLP in a.m.  6.  Hypokalemia -Replete per TRH   Fransico Him, MD  07/03/2018  8:40 AM

## 2018-07-03 NOTE — Progress Notes (Signed)
Substernal chest pain resolved at this time, Tylenol and heat packs in use for left axillary pain which patient has had since echo this morning.  Patient resting in bed, will continue to monitor.

## 2018-07-03 NOTE — Plan of Care (Signed)
Patient watched PCI video, consent form signed.  Verbalizes understanding that she is nothing by mouth after midnight for cardiac cath 07/04/18.  No further CP since episode at 1400, patient verbalizes understanding she is to alert nurse immediately if she has any further CP/jaw pain/shortness of breath episodes.  Husband at bedside.

## 2018-07-03 NOTE — Progress Notes (Addendum)
[facility] for heparin drip Indication: chest pain/ACS  No Known Allergies  Patient Measurements: Weight: 186 lb 6.4 oz (84.6 kg) Heparin Dosing Weight: 72 kg  Vital Signs: Temp: 99 F (37.2 C) (08/11 2048) Temp Source: Oral (08/11 2048) BP: 147/67 (08/11 2048) Pulse Rate: 69 (08/11 2048)  Labs: Recent Labs    07/02/18 9480  07/02/18 2106 07/03/18 0050 07/03/18 0657 07/03/18 0954 07/03/18 1222 07/03/18 1822  HGB  --   --  12.0  --   --   --   --   --   HCT  --   --  35.9*  --   --   --   --   --   PLT  --   --  220  --   --   --   --   --   APTT  --   --   --   --   --  37*  --   --   LABPROT  --   --   --   --   --  15.9*  --   --   INR  --   --   --   --   --  1.28  --   --   HEPARINUNFRC  --   --   --   --   --   --   --  0.13*  CREATININE 1.15*  --  1.10*  --  1.20*  --   --   --   TROPONINI  --    < > 0.14* 0.13* 0.11*  --  0.10*  --    < > = values in this interval not displayed.    Estimated Creatinine Clearance: 39.2 mL/min (A) (by C-G formula based on SCr of 1.2 mg/dL (H)).   Medical History: Past Medical History:  Diagnosis Date  . Anxiety   . Colon polyps   . Diabetes mellitus without complication ([place])   . Diverticulosis   . Gallstones   . GERD (gastroesophageal reflux disease)   . Hyperlipidemia   . Hypertension   . Obesity     Assessment: Patient's PMH significant for T2DM, HTN, HLD - hospitalized last week with AKI and E.coli diarrhea. Patient admitted with SOB and chest tightness. Cardiology following, planning for cardiac cath.   Patient was not taking any anticoagulants prior to admission, but did receive enoxaparin 40 mg subQ ~0100 this morning.   Today, 07/03/18  Baseline PT/INR: 15.9/1.28, aPTT: 37 seconds   CBC: Hgb and Pltc WNL   Troponin: 0.14 --> 0.13 --> 0.11 --> 0.1  Received enoxaparin 40 mg subQ at 0100 this morning. Has been discontinued  1822 heparin level = 0.13 units/mL,  subtherapeutic on heparin infusion at 850 units/hr  No bleeding or complications of therapy per nursing   Goal of Therapy:  Heparin level 0.3-0.7 units/ml Monitor platelets by anticoagulation protocol: Yes   Plan:   Heparin 2000 unit IV bolus x 1, then increase heparin infusion to 1100 units/hr  Heparin level 8 hours after rate change  CBC in AM  F/u timing of cardiac cath (per notes, will be tomorrow)    Lindell Spar, PharmD, BCPS Pager: [phone] 07/03/2018 9:07 PM

## 2018-07-04 ENCOUNTER — Encounter (HOSPITAL_COMMUNITY)
Admission: EM | Disposition: A | Discharge status: Home / Self Care | Payer: Self-pay | Source: Home / Self Care | Attending: Emergency Medicine

## 2018-07-04 DIAGNOSIS — I5031 Acute diastolic (congestive) heart failure: Secondary | ICD-10-CM | POA: Diagnosis not present

## 2018-07-04 DIAGNOSIS — R748 Abnormal levels of other serum enzymes: Secondary | ICD-10-CM | POA: Diagnosis not present

## 2018-07-04 DIAGNOSIS — I214 Non-ST elevation (NSTEMI) myocardial infarction: Secondary | ICD-10-CM | POA: Diagnosis not present

## 2018-07-04 DIAGNOSIS — I10 Essential (primary) hypertension: Secondary | ICD-10-CM | POA: Diagnosis not present

## 2018-07-04 DIAGNOSIS — E119 Type 2 diabetes mellitus without complications: Secondary | ICD-10-CM | POA: Diagnosis not present

## 2018-07-04 HISTORY — PX: LEFT HEART CATH AND CORONARY ANGIOGRAPHY: CATH118249

## 2018-07-04 LAB — BASIC METABOLIC PANEL
Anion gap: 10 (ref 5–15)
BUN: 14 mg/dL (ref 8–23)
CALCIUM: 8.1 mg/dL — AB (ref 8.9–10.3)
CO2: 23 mmol/L (ref 22–32)
CREATININE: 1.01 mg/dL — AB (ref 0.44–1.00)
Chloride: 104 mmol/L (ref 98–111)
GFR calc non Af Amer: 52 mL/min — ABNORMAL LOW (ref >60)
GFR, EST AFRICAN AMERICAN: 60 mL/min — AB (ref >60)
Glucose, Bld: 134 mg/dL — ABNORMAL HIGH (ref 70–99)
Potassium: 3.7 mmol/L (ref 3.5–5.1)
SODIUM: 137 mmol/L (ref 135–145)

## 2018-07-04 LAB — LIPID PANEL
CHOLESTEROL: 85 mg/dL (ref 0–200)
HDL: 24 mg/dL — ABNORMAL LOW (ref >40)
LDL CALC: 35 mg/dL (ref 0–99)
TRIGLYCERIDES: 128 mg/dL (ref <150)
Total CHOL/HDL Ratio: 3.5 RATIO
VLDL: 26 mg/dL (ref 0–40)

## 2018-07-04 LAB — CBC
HEMATOCRIT: 35.1 % — AB (ref 36.0–46.0)
HEMOGLOBIN: 11.6 g/dL — AB (ref 12.0–15.0)
MCH: 28.4 pg (ref 26.0–34.0)
MCHC: 33 g/dL (ref 30.0–36.0)
MCV: 86 fL (ref 78.0–100.0)
Platelets: 244 10*3/uL (ref 150–400)
RBC: 4.08 MIL/uL (ref 3.87–5.11)
RDW: 14.4 % (ref 11.5–15.5)
WBC: 11.1 10*3/uL — ABNORMAL HIGH (ref 4.0–10.5)

## 2018-07-04 LAB — HEPARIN LEVEL (UNFRACTIONATED): Heparin Unfractionated: 0.26 IU/mL — ABNORMAL LOW (ref 0.30–0.70)

## 2018-07-04 LAB — GLUCOSE, CAPILLARY
GLUCOSE-CAPILLARY: 138 mg/dL — AB (ref 70–99)
GLUCOSE-CAPILLARY: 100 mg/dL — AB (ref 70–99)
Glucose-Capillary: 111 mg/dL — ABNORMAL HIGH (ref 70–99)
Glucose-Capillary: 196 mg/dL — ABNORMAL HIGH (ref 70–99)

## 2018-07-04 SURGERY — LEFT HEART CATH AND CORONARY ANGIOGRAPHY
Anesthesia: LOCAL

## 2018-07-04 MED ORDER — HEPARIN SODIUM (PORCINE) 1000 UNIT/ML IJ SOLN
INTRAMUSCULAR | Status: AC
  Filled 2018-07-04: qty 1

## 2018-07-04 MED ORDER — MIDAZOLAM HCL 2 MG/2ML IJ SOLN
INTRAMUSCULAR | Status: DC | PRN
  Administered 2018-07-04: 1 mg via INTRAVENOUS

## 2018-07-04 MED ORDER — HEPARIN SODIUM (PORCINE) 1000 UNIT/ML IJ SOLN
INTRAMUSCULAR | Status: DC | PRN
  Administered 2018-07-04: 4500 [IU] via INTRAVENOUS

## 2018-07-04 MED ORDER — SODIUM CHLORIDE 0.9% FLUSH
3.0000 mL | Freq: Two times a day (BID) | INTRAVENOUS | Status: DC
  Administered 2018-07-04 – 2018-07-05 (×2): 3 mL via INTRAVENOUS

## 2018-07-04 MED ORDER — HEPARIN (PORCINE) IN NACL 1000-0.9 UT/500ML-% IV SOLN
INTRAVENOUS | Status: AC
  Filled 2018-07-04: qty 500

## 2018-07-04 MED ORDER — HEPARIN (PORCINE) IN NACL 2-0.9 UNITS/ML
INTRAMUSCULAR | Status: DC | PRN
  Administered 2018-07-04: 10 mL via INTRA_ARTERIAL

## 2018-07-04 MED ORDER — SODIUM CHLORIDE 0.9 % IV SOLN: INTRAVENOUS | Status: AC

## 2018-07-04 MED ORDER — HEPARIN (PORCINE) IN NACL 1000-0.9 UT/500ML-% IV SOLN
INTRAVENOUS | Status: DC | PRN
  Administered 2018-07-04 (×2): 500 mL

## 2018-07-04 MED ORDER — VERAPAMIL HCL 2.5 MG/ML IV SOLN
INTRAVENOUS | Status: AC
  Filled 2018-07-04: qty 2

## 2018-07-04 MED ORDER — HEPARIN (PORCINE) IN NACL 100-0.45 UNIT/ML-% IJ SOLN: 1300.0000 [IU]/h | INTRAMUSCULAR | Status: DC

## 2018-07-04 MED ORDER — LIDOCAINE HCL (PF) 1 % IJ SOLN
INTRAMUSCULAR | Status: AC
  Filled 2018-07-04: qty 30

## 2018-07-04 MED ORDER — IOHEXOL 350 MG/ML SOLN
INTRAVENOUS | Status: DC | PRN
  Administered 2018-07-04: 60 mL via INTRA_ARTERIAL

## 2018-07-04 MED ORDER — FENTANYL CITRATE (PF) 100 MCG/2ML IJ SOLN
INTRAMUSCULAR | Status: DC | PRN
  Administered 2018-07-04 (×2): 25 ug via INTRAVENOUS

## 2018-07-04 MED ORDER — FENTANYL CITRATE (PF) 100 MCG/2ML IJ SOLN
INTRAMUSCULAR | Status: AC
  Filled 2018-07-04: qty 2

## 2018-07-04 MED ORDER — ACETAMINOPHEN 325 MG PO TABS
650.0000 mg | ORAL_TABLET | ORAL | Status: DC | PRN
  Administered 2018-07-04: 650 mg via ORAL
  Filled 2018-07-04: qty 2

## 2018-07-04 MED ORDER — HEPARIN SODIUM (PORCINE) 5000 UNIT/ML IJ SOLN
5000.0000 [IU] | Freq: Three times a day (TID) | INTRAMUSCULAR | Status: DC
  Administered 2018-07-04 – 2018-07-05 (×3): 5000 [IU] via SUBCUTANEOUS
  Filled 2018-07-04 (×3): qty 1

## 2018-07-04 MED ORDER — LIDOCAINE HCL (PF) 1 % IJ SOLN
INTRAMUSCULAR | Status: DC | PRN
  Administered 2018-07-04: 2 mL

## 2018-07-04 MED ORDER — ONDANSETRON HCL 4 MG/2ML IJ SOLN: 4.0000 mg | Freq: Four times a day (QID) | INTRAMUSCULAR | Status: DC | PRN

## 2018-07-04 MED ORDER — MIDAZOLAM HCL 2 MG/2ML IJ SOLN
INTRAMUSCULAR | Status: AC
  Filled 2018-07-04: qty 2

## 2018-07-04 MED ORDER — SODIUM CHLORIDE 0.9% FLUSH: 3.0000 mL | INTRAVENOUS | Status: DC | PRN

## 2018-07-04 MED ORDER — SODIUM CHLORIDE 0.9 % IV SOLN: 250.0000 mL | INTRAVENOUS | Status: DC | PRN

## 2018-07-04 SURGICAL SUPPLY — 11 items
CATH 5FR JL3.5 JR4 ANG PIG MP (CATHETERS) ×2 IMPLANT
CATH INFINITI 5 FR 3DRC (CATHETERS) ×2 IMPLANT
CATH INFINITI 5FR AL1 (CATHETERS) ×2 IMPLANT
DEVICE RAD COMP TR BAND LRG (VASCULAR PRODUCTS) ×2 IMPLANT
GLIDESHEATH SLEND SS 6F .021 (SHEATH) ×2 IMPLANT
GUIDEWIRE INQWIRE 1.5J.035X260 (WIRE) ×1 IMPLANT
INQWIRE 1.5J .035X260CM (WIRE) ×2
KIT HEART LEFT (KITS) ×2 IMPLANT
PACK CARDIAC CATHETERIZATION (CUSTOM PROCEDURE TRAY) ×2 IMPLANT
TRANSDUCER W/STOPCOCK (MISCELLANEOUS) ×2 IMPLANT
TUBING CIL FLEX 10 FLL-RA (TUBING) ×2 IMPLANT

## 2018-07-04 NOTE — Progress Notes (Signed)
Ambulated to bathroom; back to stretcher laying down. Tolerated activity well.Family visiting. Ate [place] box meal.

## 2018-07-04 NOTE — Interval H&P Note (Signed)
Cath Lab Visit (complete for each Cath Lab visit)  Clinical Evaluation Leading to the Procedure:   ACS: Yes.    Non-ACS:    Anginal Classification: CCS IV  Anti-ischemic medical therapy: Minimal Therapy (1 class of medications)  Non-Invasive Test Results: No non-invasive testing performed  Prior CABG: No previous CABG      History and Physical Interval Note:  07/04/2018 2:59 PM  Andrea Brooks  has presented today for surgery, with the diagnosis of NSTEMI  The various methods of treatment have been discussed with the patient and family. After consideration of risks, benefits and other options for treatment, the patient has consented to  Procedure(s): LEFT HEART CATH AND CORONARY ANGIOGRAPHY (N/A) as a surgical intervention .  The patient's history has been reviewed, patient examined, no change in status, stable for surgery.  I have reviewed the patient's chart and labs.  Questions were answered to the patient's satisfaction.     Larae Grooms

## 2018-07-04 NOTE — Progress Notes (Signed)
Offered CHF program to pt, however the pt declined at present time.

## 2018-07-04 NOTE — Progress Notes (Signed)
PROGRESS NOTE  Andrea Brooks RCV:[id number] DOB: 24-Aug-1936 DOA: 07/02/2018 PCP: Esaw Grandchild, NP  HPI/Recap of past 24 hours: Andrea Brooks is a 82 y.o. female with medical history significant of DM2, HTN.  Patient was recently admitted to our service on 8/6, discharged earlier today due to AKI associated with E.Coli diarrhea.  Diarrhea and AKI resolved (creat back down to 1.1, and one formed stool this AM, none since today).  However, patient had SOB, chest tightness at home.  She remains off of Losartan at home which was held for AKI during admit.  Presents to ED. Trop 0.14, BNP 345.  CTA chest shows no PE, shows small B pleural effusions. Cardiology consulted and following.  07/04/18: Seen and examined at her bedside this morning. Reports persistent mild discomfort to left chest and jaw. Taken to the cath lab and returned this evening.  Assessment/Plan: Principal Problem:   Acute diastolic CHF (congestive heart failure) (HCC) Active Problems:   Essential hypertension   Diabetes mellitus without complication (HCC)   Elevated troponin   Acute congestive heart failure (HCC)   Non-ST elevation (NSTEMI) myocardial infarction (HCC)  Acute diastolic CHF exacerbation 2D echo done on 07/03/2018 revealed LVEF 60 to 65% with normal wall motion Cardiology consulted and following Strict I's and O's and daily weight Cardiac meds per cardiology  Elevated troponin most likely demand ischemia Peaked at 0.14 and trended down Personally reviewed twelve-lead EKG done on 07/02/2018 which revealed sinus rhythm rate of 77 with no specific ST-T changes. Post Left heart cath Needs to continue medical management  Hypokalemia, repleted Repleted with p.o. potassium supplement and IV magnesium once Repeat BMP in am  Hypertension BP is stable Continue Irbesartan C/w Lopressor 25 twice daily per cardiology  Hyperlipidemia C/w Lipitor 40 mg daily per cardiology  Obesity Weight loss  outpatient  CKD 3 Creatinine appears to be at baseline GFR 42 Avoid nephrotoxic agent/hypotension/dehydration Repeat BMP in the morning     Code Status: Full  Family Communication: None bedside  Disposition Plan: Home, possibly tomorrow, when cardiology signs off   Consultants:  Cardiology  Procedures:  Left heart cath  Antimicrobials: None  DVT prophylaxis: Heparin drip   Objective: Vitals:   07/04/18 1821 07/04/18 1835 07/04/18 1850 07/04/18 1920  BP: (!) 165/65 (!) 166/59 (!) 157/59 (!) 152/56  Pulse: 82 80 78 82  Resp: (!) 24 (!) 22 (!) 23 (!) 26  Temp:      TempSrc:      SpO2: 95% 92% 97% 97%  Weight:      Height:        Intake/Output Summary (Last 24 hours) at 07/04/2018 2048 Last data filed at 07/04/2018 0700 Gross per 24 hour  Intake 40.77 ml  Output 600 ml  Net -559.23 ml   Filed Weights   07/03/18 0500 07/04/18 0529  Weight: 84.6 kg 84.6 kg    Exam:  . General: 83 y.o. year-old female WD WN NAD A&O x3 . Cardiovascular: RRR no rubs or gallops. No JVD or thyromegaly. Marland Kitchen Respiratory: Clear to auscultation with no wheezes or rales. Good inspiratory effort. . Abdomen: Soft nontender nondistended with normal bowel sounds x4 quadrants. . Musculoskeletal: No lower extremity edema. 2/4 pulses in all 4 extremities. . Skin: No ulcerative lesions noted or rashes, . Psychiatry: Mood is appropriate for condition and setting   Data Reviewed: CBC: Recent Labs  Lab 06/28/18 1134 06/28/18 1745 06/29/18 0613 06/30/18 0548 07/02/18 2106 07/04/18 0526  WBC 5.6 5.4 4.9  7.0 10.5 11.1*  NEUTROABS 3.4  --   --  5.1 8.7*  --   HGB 13.1 11.6* 11.8* 10.6* 12.0 11.6*  HCT 39.3 35.5* 35.8* 31.6* 35.9* 35.1*  MCV 86.9 88.1 87.3 86.6 85.7 86.0  PLT 172 173 167 189 220 924   Basic Metabolic Panel: Recent Labs  Lab 06/29/18 0613 06/30/18 0548 07/01/18 0558 07/02/18 0611 07/02/18 2106 07/03/18 0657 07/04/18 0526  NA 141 139 142 139 140 140 137  K  4.0 4.0 3.9 3.7 3.7 3.3* 3.7  CL 110 109 111 112* 107 104 104  CO2 22 22 22  20* 22 25 23   GLUCOSE 124* 107* 109* 105* 143* 123* 134*  BUN 26* 24* 19 14 12 11 14   CREATININE 2.32* 1.74* 1.51* 1.15* 1.10* 1.20* 1.01*  CALCIUM 8.8* 8.4* 8.2* 7.9* 8.4* 8.4* 8.1*  MG 1.6* 1.9  --   --   --   --   --    GFR: Estimated Creatinine Clearance: 46.6 mL/min (A) (by C-G formula based on SCr of 1.01 mg/dL (H)). Liver Function Tests: Recent Labs  Lab 06/28/18 1134 06/29/18 0613 07/02/18 2106  AST 29 23 28   ALT 20 17 19   ALKPHOS 61 56 60  BILITOT 0.5 0.7 0.3  PROT 7.5 6.3* 7.2  ALBUMIN 3.6 3.1* 3.4*   Recent Labs  Lab 06/28/18 1134  LIPASE 29   No results for input(s): AMMONIA in the last 168 hours. Coagulation Profile: Recent Labs  Lab 07/03/18 0954  INR 1.28   Cardiac Enzymes: Recent Labs  Lab 07/02/18 2106 07/03/18 0050 07/03/18 0657 07/03/18 1222  TROPONINI 0.14* 0.13* 0.11* 0.10*   BNP (last 3 results) No results for input(s): PROBNP in the last 8760 hours. HbA1C: No results for input(s): HGBA1C in the last 72 hours. CBG: Recent Labs  Lab 07/03/18 1615 07/03/18 2050 07/04/18 0723 07/04/18 1125 07/04/18 1632  GLUCAP 147* 142* 138* 111* 100*   Lipid Profile: Recent Labs    07/04/18 0526  CHOL 85  HDL 24*  LDLCALC 35  TRIG 128  CHOLHDL 3.5   Thyroid Function Tests: No results for input(s): TSH, T4TOTAL, FREET4, T3FREE, THYROIDAB in the last 72 hours. Anemia Panel: No results for input(s): VITAMINB12, FOLATE, FERRITIN, TIBC, IRON, RETICCTPCT in the last 72 hours. Urine analysis:    Component Value Date/Time   COLORURINE YELLOW 06/28/2018 1340   APPEARANCEUR CLEAR 06/28/2018 1340   LABSPEC <1.005 (L) 06/28/2018 1340   PHURINE 6.0 06/28/2018 1340   GLUCOSEU NEGATIVE 06/28/2018 1340   HGBUR NEGATIVE 06/28/2018 1340   BILIRUBINUR NEGATIVE 06/28/2018 1340   KETONESUR NEGATIVE 06/28/2018 1340   PROTEINUR NEGATIVE 06/28/2018 1340   NITRITE NEGATIVE  06/28/2018 1340   LEUKOCYTESUR NEGATIVE 06/28/2018 1340   Sepsis Labs: @LABRCNTIP (procalcitonin:4,lacticidven:4)  ) Recent Results (from the past 240 hour(s))  C difficile quick scan w PCR reflex     Status: None   Collection Time: 06/28/18  3:35 PM  Result Value Ref Range Status   C Diff antigen NEGATIVE NEGATIVE Final   C Diff toxin NEGATIVE NEGATIVE Final   C Diff interpretation No C. difficile detected.  Final    Comment: Performed at [facility], [place] [address], [place], [place] [address]  Gastrointestinal Panel by PCR , Stool     Status: Abnormal   Collection Time: 06/28/18  3:35 PM  Result Value Ref Range Status   Campylobacter species NOT DETECTED NOT DETECTED Final   Plesimonas shigelloides NOT DETECTED NOT DETECTED Final  Salmonella species NOT DETECTED NOT DETECTED Final   Yersinia enterocolitica NOT DETECTED NOT DETECTED Final   Vibrio species NOT DETECTED NOT DETECTED Final   Vibrio cholerae NOT DETECTED NOT DETECTED Final   Enteroaggregative E coli (EAEC) NOT DETECTED NOT DETECTED Final   Enteropathogenic E coli (EPEC) DETECTED (A) NOT DETECTED Final    Comment: RESULT CALLED TO, READ BACK BY AND VERIFIED WITH: THEHAITSE GEBRU @2041  06/29/18 AKT    Enterotoxigenic E coli (ETEC) NOT DETECTED NOT DETECTED Final   Shiga like toxin producing E coli (STEC) NOT DETECTED NOT DETECTED Final   Shigella/Enteroinvasive E coli (EIEC) NOT DETECTED NOT DETECTED Final   Cryptosporidium NOT DETECTED NOT DETECTED Final   Cyclospora cayetanensis NOT DETECTED NOT DETECTED Final   Entamoeba histolytica NOT DETECTED NOT DETECTED Final   Giardia lamblia NOT DETECTED NOT DETECTED Final   Adenovirus F40/41 NOT DETECTED NOT DETECTED Final   Astrovirus NOT DETECTED NOT DETECTED Final   Norovirus GI/GII NOT DETECTED NOT DETECTED Final   Rotavirus A NOT DETECTED NOT DETECTED Final   Sapovirus (I, II, IV, and V) NOT DETECTED NOT DETECTED Final    Comment: Performed at [facility], [address], [place], [place] [address]      Studies: No results found.  Scheduled Meds: . aspirin EC  81 mg Oral Daily  . atorvastatin  40 mg Oral q1800  . heparin  5,000 Units Subcutaneous Q8H  . insulin aspart  0-9 Units Subcutaneous TID WC  . irbesartan  150 mg Oral Daily  . nitroGLYCERIN  0.5 inch Topical TID  . pantoprazole  20 mg Oral Daily  . sodium chloride flush  3 mL Intravenous Q12H  . sodium chloride flush  3 mL Intravenous Q12H    Continuous Infusions: . sodium chloride 250 mL (07/03/18 1509)  . sodium chloride       LOS: 0 days     Kayleen Memos, MD [facility] Pager [phone]  If 7PM-7AM, please contact night-coverage [email or web address] Password [facility] 07/04/2018, 8:48 PM

## 2018-07-04 NOTE — Progress Notes (Signed)
ANTICOAGULATION CONSULT NOTE   Pharmacy Consult for heparin drip Indication: chest pain/ACS  No Known Allergies  Patient Measurements: Height: 5\' 3"  (160 cm) Weight: 186 lb 8 oz (84.6 kg) IBW/kg (Calculated) : 52.4 Heparin Dosing Weight: 72 kg  Vital Signs: Temp: 98.9 F (37.2 C) (08/12 0529) Temp Source: Oral (08/12 0529) BP: 186/87 (08/12 0529) Pulse Rate: 71 (08/12 0529)  Labs: Recent Labs    07/02/18 2106 07/03/18 0050 07/03/18 0657 07/03/18 0954 07/03/18 1222 07/03/18 1822 07/04/18 0526  HGB 12.0  --   --   --   --   --  11.6*  HCT 35.9*  --   --   --   --   --  35.1*  PLT 220  --   --   --   --   --  244  APTT  --   --   --  37*  --   --   --   LABPROT  --   --   --  15.9*  --   --   --   INR  --   --   --  1.28  --   --   --   HEPARINUNFRC  --   --   --   --   --  0.13* 0.26*  CREATININE 1.10*  --  1.20*  --   --   --  1.01*  TROPONINI 0.14* 0.13* 0.11*  --  0.10*  --   --     Estimated Creatinine Clearance: 46.6 mL/min (A) (by C-G formula based on SCr of 1.01 mg/dL (H)).   Medical History: Past Medical History:  Diagnosis Date  . Anxiety   . Colon polyps   . Diabetes mellitus without complication ([place])   . Diverticulosis   . Gallstones   . GERD (gastroesophageal reflux disease)   . Hyperlipidemia   . Hypertension   . Obesity     Assessment: Patient's PMH significant for T2DM, HTN, HLD - hospitalized last week with AKI and E.coli diarrhea. Patient admitted with SOB and chest tightness. Cardiology following, planning for cardiac cath.   Patient was not taking any anticoagulants prior to admission, but did receive enoxaparin 40 mg subQ ~0100 this morning.   Today, 07/04/18  Baseline PT/INR: 15.9/1.28, aPTT: 37 seconds   CBC: Hgb and Pltc WNL   Troponin: 0.14 --> 0.13 --> 0.11 --> 0.1  Received enoxaparin 40 mg subQ at 0100 this morning. Has been discontinued  1822 heparin level = 0.13 units/mL, subtherapeutic on heparin infusion at 850  units/hr  No bleeding or complications of therapy per nursing   Goal of Therapy:  Heparin level 0.3-0.7 units/ml Monitor platelets by anticoagulation protocol: Yes   Plan:    Increase heparin infusion to 1300 units/hr  Recheck HL in 8 hours  CBC in AM  F/u timing of cardiac cath    Dorrene German 07/03/2018 9:07 PM

## 2018-07-04 NOTE — Progress Notes (Signed)
Initial Nutrition Assessment  DOCUMENTATION CODES:   Obesity unspecified  INTERVENTION:  - Diet advancement as medically feasible. - Will perform NFPE at follow-up.  NUTRITION DIAGNOSIS:   Inadequate oral intake related to inability to eat as evidenced by NPO status.  GOAL:   Patient will meet greater than or equal to 90% of their needs  MONITOR:   Diet advancement, PO intake, Weight trends, Labs, I & O's  REASON FOR ASSESSMENT:   Malnutrition Screening Tool  ASSESSMENT:   82 year old female with hx of HTN, DM, and hyperlipidemia. She discharged from the hospital on 8/9 (admit for AKI and diarrhea) and presented to the ED later that day for SOB and pain. She states that just prior to leaving the hospital she developed soreness across her chest, anterior neck, and back; she had not told anyone about this. No coughing or leg swelling.  She had been receiving IV fluids for multiple days due to the AKI. No abdominal pain or recurrent vomiting or diarrhea.  Patient placed on Carb Modified diet 8/10 at 11:11 PM and then to NPO at midnight but allowed to have clears until 5:00 AM. Flow sheet indicates that patient age a bagel for breakfast and tuna salad for lunch yesterday. She was [facility] to [facility] Lab about 40 minutes ago. Unable to obtain any information from previous hospitalization or since returning to the hospital on 8/10.   Per Dr. Juel Burrow note yesterday afternoon: acute on CHF exacerbation, elevated troponin thought to be 2/2 demand ischemia, hypokalemia, HTN, obesity with plan for weight loss as an outpatient.    Medications reviewed; sliding scale Novolog, 2 g IV Mg sulfate x1 dose yesterday, 40 mEq oral KCl x1 dose yesterday. Labs reviewed; CBGs: 138 and 111 mg/dL today, creatinine: 1.01 mg/dL, Ca: 8.1 mg/dL, GFR: 52 mL/min.     NUTRITION - FOCUSED PHYSICAL EXAM:  Unable to perform.   Diet Order:   Diet Order            Diet NPO time specified Except for: Sips  with Meds  Diet effective midnight              EDUCATION NEEDS:   No education needs have been identified at this time  Skin:  Skin Assessment: Reviewed RN Assessment  Last BM:  8/10  Height:   Ht Readings from Last 1 Encounters:  07/03/18 5\' 3"  (1.6 m)    Weight:   Wt Readings from Last 1 Encounters:  07/04/18 84.6 kg    Ideal Body Weight:  52.27 kg  BMI:  Body mass index is 33.04 kg/m.  Estimated Nutritional Needs:   Kcal:  1690-1860 (20-22 kcal/kg)  Protein:  75-85 grams  Fluid:  >/= 1.2 L/day     Jarome Matin, MS, RD, LDN, [facility] Inpatient Clinical Dietitian Pager # [phone] After hours/weekend pager # [phone]

## 2018-07-04 NOTE — H&P (View-Only) (Signed)
Progress Note  Patient Name: Andrea Brooks Date of Encounter: 07/04/2018  Primary Cardiologist: Fransico Him, MD   Subjective   Ms. Mckeag denies any further substernal chest discomfort.  She has having left lower rib pain with movement and deep breathing, reproducible to palpation.  She denies dyspnea or orthopnea.  Inpatient Medications    Scheduled Meds: . aspirin EC  81 mg Oral Daily  . atorvastatin  40 mg Oral q1800  . insulin aspart  0-9 Units Subcutaneous TID WC  . irbesartan  150 mg Oral Daily  . metoprolol tartrate  25 mg Oral BID  . nitroGLYCERIN  0.5 inch Topical TID  . pantoprazole  20 mg Oral Daily  . sodium chloride flush  3 mL Intravenous Q12H  . sodium chloride flush  3 mL Intravenous Q12H   Continuous Infusions: . sodium chloride 250 mL (07/03/18 1509)  . sodium chloride    . sodium chloride 10 mL/hr at 07/04/18 0549  . heparin 1,300 Units/hr (07/04/18 0615)   PRN Meds: sodium chloride, sodium chloride, acetaminophen, dicyclomine, LORazepam, ondansetron (ZOFRAN) IV, sodium chloride flush, sodium chloride flush, zolpidem   Vital Signs    Vitals:   07/03/18 2048 07/03/18 2216 07/04/18 0529 07/04/18 0728  BP: (!) 147/67  (!) 186/87 139/63  Pulse: 69  71 70  Resp: 16  16   Temp: 99 F (37.2 C)  98.9 F (37.2 C)   TempSrc: Oral  Oral   SpO2: 97%  92%   Weight:   84.6 kg   Height:  5\' 3"  (1.6 m)      Intake/Output Summary (Last 24 hours) at 07/04/2018 1022 Last data filed at 07/04/2018 0700 Gross per 24 hour  Intake 2246.17 ml  Output 1500 ml  Net 746.17 ml   Filed Weights   07/03/18 0500 07/04/18 0529  Weight: 84.6 kg 84.6 kg    Telemetry    This rhythm in the 70's- Personally Reviewed  ECG    Sinus rhythm with 1st degree A-V block, Low voltage QRS, poor R wave progression, 70 bpm- Personally Reviewed  Physical Exam   GEN: No acute distress.   Neck: No JVD Cardiac: RRR, no murmurs, rubs, or gallops.  Respiratory: Clear to  auscultation bilaterally. GI: Soft, nontender, non-distended  MS: No edema; No deformity. Neuro:  Nonfocal  Psych: Normal affect   Labs    Chemistry Recent Labs  Lab 06/28/18 1134  06/29/18 0613  07/02/18 2106 07/03/18 0657 07/04/18 0526  NA 138  --  141   < > 140 140 137  K 3.9  --  4.0   < > 3.7 3.3* 3.7  CL 105  --  110   < > 107 104 104  CO2 22  --  22   < > 22 25 23   GLUCOSE 114*  --  124*   < > 143* 123* 134*  BUN 25*  --  26*   < > 12 11 14   CREATININE 2.53*   < > 2.32*   < > 1.10* 1.20* 1.01*  CALCIUM 8.7*  --  8.8*   < > 8.4* 8.4* 8.1*  PROT 7.5  --  6.3*  --  7.2  --   --   ALBUMIN 3.6  --  3.1*  --  3.4*  --   --   AST 29  --  23  --  28  --   --   ALT 20  --  17  --  19  --   --   ALKPHOS 61  --  56  --  60  --   --   BILITOT 0.5  --  0.7  --  0.3  --   --   GFRNONAA 17*   < > 19*   < > 46* 42* 52*  GFRAA 20*   < > 22*   < > 54* 48* 60*  ANIONGAP 11  --  9   < > 11 11 10    < > = values in this interval not displayed.     Hematology Recent Labs  Lab 06/30/18 0548 07/02/18 2106 07/04/18 0526  WBC 7.0 10.5 11.1*  RBC 3.65* 4.19 4.08  HGB 10.6* 12.0 11.6*  HCT 31.6* 35.9* 35.1*  MCV 86.6 85.7 86.0  MCH 29.0 28.6 28.4  MCHC 33.5 33.4 33.0  RDW 14.3 13.8 14.4  PLT 189 220 244    Cardiac Enzymes Recent Labs  Lab 07/02/18 2106 07/03/18 0050 07/03/18 0657 07/03/18 1222  TROPONINI 0.14* 0.13* 0.11* 0.10*    Recent Labs  Lab 07/02/18 2115  TROPIPOC 0.09*     BNP Recent Labs  Lab 07/02/18 2048  BNP 345.5*     DDimer  Recent Labs  Lab 07/02/18 2106  DDIMER 1.31*     Radiology    Dg Chest 2 View  Result Date: 07/02/2018 CLINICAL DATA:  Shortness of breath. EXAM: CHEST - 2 VIEW COMPARISON:  None FINDINGS: Mild cardiac enlargement. Pulmonary vascular congestion. No airspace opacities. The visualized skeletal structures are unremarkable. IMPRESSION: 1. Cardiac enlargement and pulmonary vascular congestion. Electronically Signed   By:  Kerby Moors M.D.   On: 07/02/2018 20:48   Ct Angio Chest Pe W/cm &/or Wo Cm  Result Date: 07/02/2018 CLINICAL DATA:  Intermediate probability for pulmonary embolus. Mid sternal chest pain. EXAM: CT ANGIOGRAPHY CHEST WITH CONTRAST TECHNIQUE: Multidetector CT imaging of the chest was performed using the standard protocol during bolus administration of intravenous contrast. Multiplanar CT image reconstructions and MIPs were obtained to evaluate the vascular anatomy. CONTRAST:  80mL ISOVUE-370 IOPAMIDOL (ISOVUE-370) INJECTION 76% COMPARISON:  None. FINDINGS: Cardiovascular: Moderate cardiac enlargement. No pericardial effusion. Aortic atherosclerosis. Calcification in the LAD and RCA coronary artery noted. The main pulmonary artery appears patent. No saddle embolus. No lobar or segmental pulmonary artery filling defects identified. Mediastinum/Nodes: Normal appearance of the thyroid gland. There is diminished AP diameter of the trachea which may be a manifestation of tracheobronchomalacia. Normal appearance of the esophagus. No supraclavicular, axillary or mediastinal adenopathy no hilar adenopathy. Lungs/Pleura: Small bilateral pleural effusions noted, left greater than right. Subsegmental atelectasis within the left lower lobe. Mosaic attenuation pattern is identified bilaterally which may be a manifestation of small airways disease. Upper Abdomen: No acute abnormality. Musculoskeletal: No chest wall abnormality. No acute or significant osseous findings. Review of the MIP images confirms the above findings. IMPRESSION: 1. No evidence for acute pulmonary embolus. 2. Small bilateral pleural effusions left greater than right. 3. Multi vessel coronary artery atherosclerotic calcifications. Aortic Atherosclerosis (ICD10-I70.0). Electronically Signed   By: Kerby Moors M.D.   On: 07/02/2018 22:45    Cardiac Studies   Echocardiogram 07/03/2018 Study Conclusions - Left ventricle: The cavity size was normal.  Wall thickness was   increased in a pattern of moderate LVH. Systolic function was   normal. The estimated ejection fraction was in the range of 60%   to 65%. Diastolic function is abnormal, indeterminant grade.   There is evidence of elevated  LA pressure. Wall motion was   normal; there were no regional wall motion abnormalities. - Aortic valve: Valve area (VTI): 2.01 cm^2. Valve area (Vmax):   2.11 cm^2. Valve area (Vmean): 2.38 cm^2. - Mitral valve: There was mild regurgitation. - Left atrium: The atrium was mildly dilated. - Right ventricle: The cavity size was mildly dilated. Wall   thickness was normal. - Pulmonary arteries: Systolic pressure was mildly increased. PA   peak pressure: 34 mm Hg (S). - Technically adequate study.   Patient Profile     82 y.o. female with history of type 2 diabetes, hypertension, GERD, hyperlipidemia and obesity who was hospitalized last week with acute kidney injury associated with E. coli diarrhea.  On 07/02/2018 she developed shortness of breath and chest pain that radiated to her shoulders.  Troponin 0 0.14  Assessment & Plan    Non-STEMI -Initial troponin 0.14, trended down to 0.10.  BNP 345.5. -No evidence of PE on CTA chest, but did show small bilateral pleural effusions.  Also noted coronary calcifications of the LAD and RCA with moderate cardiac enlargement on the chest CT. -EKG with poor R wave progression, but no acute ST changes -Echo showed normal LV function with moderate LVH and no regional wall motion abnormalities -IV heparin infusing.  Continued on aspirin and statin.  Beta-blocker added. -No further substernal chest discomfort that radiated to the neck as she had on presentation but she is now having left lower rib pain worse with deep breathing and movement. -Plan for cardiac catheterization today to further evaluate coronary arteries -Creatinine stable today 1.01, down from 1.20 yesterday  Acute CHF -Echo was normal LV  function -BNP 345 -Bilateral pleural effusions on chest CT -She has received 1 dose of Lasix 40 mg IV   Type 2 diabetes mellitus -HbA1c 6.3, adequate control -Management per [facility]  Hypertension -BP borderline controlled at 138-186/60's mmHg -Continue ARB -Lopressor 25 mg twice daily added  Hyperlipidemia -LDL was 94 in December 2018.  Lipid panel done this morning shows LDL of 35. -Vytorin to high intensity statin, Lipitor 40 mg daily  Hypokalemia -Up from 3.3-3.7 with repletion by primary      For questions or updates, please contact [facility] Please consult [email or web address] for contact info under Cardiology/STEMI.      Signed, Daune Perch, NP  07/04/2018, 10:22 AM

## 2018-07-04 NOTE — Progress Notes (Signed)
TR BAND REMOVAL  LOCATION:    Radial rt wrist  DEFLATED PER PROTOCOL:   yes  TIME BAND OFF / DRESSING APPLIED:    1900/ gauze and tegaderm  SITE UPON ARRIVAL:    Level 0  SITE AFTER BAND REMOVAL:    Level 0; faint bruise around puncture site where band was compressing  CIRCULATION SENSATION AND MOVEMENT:    Within Normal Limits : yes, rt hand and fingers warm and pink, sensation present, rt arm elevated on pillow  COMMENTS:

## 2018-07-04 NOTE — Progress Notes (Signed)
Progress Note  Patient Name: Andrea Brooks Date of Encounter: 07/04/2018  Primary Cardiologist: Fransico Him, MD   Subjective   Ms. Couzens denies any further substernal chest discomfort.  She has having left lower rib pain with movement and deep breathing, reproducible to palpation.  She denies dyspnea or orthopnea.  Inpatient Medications    Scheduled Meds: . aspirin EC  81 mg Oral Daily  . atorvastatin  40 mg Oral q1800  . insulin aspart  0-9 Units Subcutaneous TID WC  . irbesartan  150 mg Oral Daily  . metoprolol tartrate  25 mg Oral BID  . nitroGLYCERIN  0.5 inch Topical TID  . pantoprazole  20 mg Oral Daily  . sodium chloride flush  3 mL Intravenous Q12H  . sodium chloride flush  3 mL Intravenous Q12H   Continuous Infusions: . sodium chloride 250 mL (07/03/18 1509)  . sodium chloride    . sodium chloride 10 mL/hr at 07/04/18 0549  . heparin 1,300 Units/hr (07/04/18 0615)   PRN Meds: sodium chloride, sodium chloride, acetaminophen, dicyclomine, LORazepam, ondansetron (ZOFRAN) IV, sodium chloride flush, sodium chloride flush, zolpidem   Vital Signs    Vitals:   07/03/18 2048 07/03/18 2216 07/04/18 0529 07/04/18 0728  BP: (!) 147/67  (!) 186/87 139/63  Pulse: 69  71 70  Resp: 16  16   Temp: 99 F (37.2 C)  98.9 F (37.2 C)   TempSrc: Oral  Oral   SpO2: 97%  92%   Weight:   84.6 kg   Height:  5\' 3"  (1.6 m)      Intake/Output Summary (Last 24 hours) at 07/04/2018 1022 Last data filed at 07/04/2018 0700 Gross per 24 hour  Intake 2246.17 ml  Output 1500 ml  Net 746.17 ml   Filed Weights   07/03/18 0500 07/04/18 0529  Weight: 84.6 kg 84.6 kg    Telemetry    This rhythm in the 70's- Personally Reviewed  ECG    Sinus rhythm with 1st degree A-V block, Low voltage QRS, poor R wave progression, 70 bpm- Personally Reviewed  Physical Exam   GEN: No acute distress.   Neck: No JVD Cardiac: RRR, no murmurs, rubs, or gallops.  Respiratory: Clear to  auscultation bilaterally. GI: Soft, nontender, non-distended  MS: No edema; No deformity. Neuro:  Nonfocal  Psych: Normal affect   Labs    Chemistry Recent Labs  Lab 06/28/18 1134  06/29/18 0613  07/02/18 2106 07/03/18 0657 07/04/18 0526  NA 138  --  141   < > 140 140 137  K 3.9  --  4.0   < > 3.7 3.3* 3.7  CL 105  --  110   < > 107 104 104  CO2 22  --  22   < > 22 25 23   GLUCOSE 114*  --  124*   < > 143* 123* 134*  BUN 25*  --  26*   < > 12 11 14   CREATININE 2.53*   < > 2.32*   < > 1.10* 1.20* 1.01*  CALCIUM 8.7*  --  8.8*   < > 8.4* 8.4* 8.1*  PROT 7.5  --  6.3*  --  7.2  --   --   ALBUMIN 3.6  --  3.1*  --  3.4*  --   --   AST 29  --  23  --  28  --   --   ALT 20  --  17  --  19  --   --   ALKPHOS 61  --  56  --  60  --   --   BILITOT 0.5  --  0.7  --  0.3  --   --   GFRNONAA 17*   < > 19*   < > 46* 42* 52*  GFRAA 20*   < > 22*   < > 54* 48* 60*  ANIONGAP 11  --  9   < > 11 11 10    < > = values in this interval not displayed.     Hematology Recent Labs  Lab 06/30/18 0548 07/02/18 2106 07/04/18 0526  WBC 7.0 10.5 11.1*  RBC 3.65* 4.19 4.08  HGB 10.6* 12.0 11.6*  HCT 31.6* 35.9* 35.1*  MCV 86.6 85.7 86.0  MCH 29.0 28.6 28.4  MCHC 33.5 33.4 33.0  RDW 14.3 13.8 14.4  PLT 189 220 244    Cardiac Enzymes Recent Labs  Lab 07/02/18 2106 07/03/18 0050 07/03/18 0657 07/03/18 1222  TROPONINI 0.14* 0.13* 0.11* 0.10*    Recent Labs  Lab 07/02/18 2115  TROPIPOC 0.09*     BNP Recent Labs  Lab 07/02/18 2048  BNP 345.5*     DDimer  Recent Labs  Lab 07/02/18 2106  DDIMER 1.31*     Radiology    Dg Chest 2 View  Result Date: 07/02/2018 CLINICAL DATA:  Shortness of breath. EXAM: CHEST - 2 VIEW COMPARISON:  None FINDINGS: Mild cardiac enlargement. Pulmonary vascular congestion. No airspace opacities. The visualized skeletal structures are unremarkable. IMPRESSION: 1. Cardiac enlargement and pulmonary vascular congestion. Electronically Signed   By:  Kerby Moors M.D.   On: 07/02/2018 20:48   Ct Angio Chest Pe W/cm &/or Wo Cm  Result Date: 07/02/2018 CLINICAL DATA:  Intermediate probability for pulmonary embolus. Mid sternal chest pain. EXAM: CT ANGIOGRAPHY CHEST WITH CONTRAST TECHNIQUE: Multidetector CT imaging of the chest was performed using the standard protocol during bolus administration of intravenous contrast. Multiplanar CT image reconstructions and MIPs were obtained to evaluate the vascular anatomy. CONTRAST:  98mL ISOVUE-370 IOPAMIDOL (ISOVUE-370) INJECTION 76% COMPARISON:  None. FINDINGS: Cardiovascular: Moderate cardiac enlargement. No pericardial effusion. Aortic atherosclerosis. Calcification in the LAD and RCA coronary artery noted. The main pulmonary artery appears patent. No saddle embolus. No lobar or segmental pulmonary artery filling defects identified. Mediastinum/Nodes: Normal appearance of the thyroid gland. There is diminished AP diameter of the trachea which may be a manifestation of tracheobronchomalacia. Normal appearance of the esophagus. No supraclavicular, axillary or mediastinal adenopathy no hilar adenopathy. Lungs/Pleura: Small bilateral pleural effusions noted, left greater than right. Subsegmental atelectasis within the left lower lobe. Mosaic attenuation pattern is identified bilaterally which may be a manifestation of small airways disease. Upper Abdomen: No acute abnormality. Musculoskeletal: No chest wall abnormality. No acute or significant osseous findings. Review of the MIP images confirms the above findings. IMPRESSION: 1. No evidence for acute pulmonary embolus. 2. Small bilateral pleural effusions left greater than right. 3. Multi vessel coronary artery atherosclerotic calcifications. Aortic Atherosclerosis (ICD10-I70.0). Electronically Signed   By: Kerby Moors M.D.   On: 07/02/2018 22:45    Cardiac Studies   Echocardiogram 07/03/2018 Study Conclusions - Left ventricle: The cavity size was normal.  Wall thickness was   increased in a pattern of moderate LVH. Systolic function was   normal. The estimated ejection fraction was in the range of 60%   to 65%. Diastolic function is abnormal, indeterminant grade.   There is evidence of elevated  LA pressure. Wall motion was   normal; there were no regional wall motion abnormalities. - Aortic valve: Valve area (VTI): 2.01 cm^2. Valve area (Vmax):   2.11 cm^2. Valve area (Vmean): 2.38 cm^2. - Mitral valve: There was mild regurgitation. - Left atrium: The atrium was mildly dilated. - Right ventricle: The cavity size was mildly dilated. Wall   thickness was normal. - Pulmonary arteries: Systolic pressure was mildly increased. PA   peak pressure: 34 mm Hg (S). - Technically adequate study.   Patient Profile     82 y.o. female with history of type 2 diabetes, hypertension, GERD, hyperlipidemia and obesity who was hospitalized last week with acute kidney injury associated with E. coli diarrhea.  On 07/02/2018 she developed shortness of breath and chest pain that radiated to her shoulders.  Troponin 0 0.14  Assessment & Plan    Non-STEMI -Initial troponin 0.14, trended down to 0.10.  BNP 345.5. -No evidence of PE on CTA chest, but did show small bilateral pleural effusions.  Also noted coronary calcifications of the LAD and RCA with moderate cardiac enlargement on the chest CT. -EKG with poor R wave progression, but no acute ST changes -Echo showed normal LV function with moderate LVH and no regional wall motion abnormalities -IV heparin infusing.  Continued on aspirin and statin.  Beta-blocker added. -No further substernal chest discomfort that radiated to the neck as she had on presentation but she is now having left lower rib pain worse with deep breathing and movement. -Plan for cardiac catheterization today to further evaluate coronary arteries -Creatinine stable today 1.01, down from 1.20 yesterday  Acute CHF -Echo was normal LV  function -BNP 345 -Bilateral pleural effusions on chest CT -She has received 1 dose of Lasix 40 mg IV   Type 2 diabetes mellitus -HbA1c 6.3, adequate control -Management per [facility]  Hypertension -BP borderline controlled at 138-186/60's mmHg -Continue ARB -Lopressor 25 mg twice daily added  Hyperlipidemia -LDL was 94 in December 2018.  Lipid panel done this morning shows LDL of 35. -Vytorin to high intensity statin, Lipitor 40 mg daily  Hypokalemia -Up from 3.3-3.7 with repletion by primary      For questions or updates, please contact [facility] Please consult [email or web address] for contact info under Cardiology/STEMI.      Signed, Daune Perch, NP  07/04/2018, 10:22 AM

## 2018-07-04 NOTE — Progress Notes (Addendum)
PROGRESS NOTE  Andrea Brooks CHY:[id number] DOB: [birth year] DOA: 06/28/2018 PCP: Esaw Grandchild, NP  HPI/Recap of past 24 hours: Andrea Brooks is a 82 y.o. female with medical history significant of DM2, HTN.  Patient was recently admitted to our service on 8/6, discharged earlier today due to AKI associated with E.Coli diarrhea.  Diarrhea and AKI resolved (creat back down to 1.1, and one formed stool this AM, none since today).  However, patient had SOB, chest tightness at home.  She remains off of Losartan at home which was held for AKI during admit.  Presents to ED. Trop 0.14, BNP 345.  CTA chest shows no PE, shows small B pleural effusions.  07/04/2018: Patient seen and examined at her bedside.  Reports intermittent mild pain to her left chest and jaw.  Denies dyspnea or palpitations.  Left heart cath planned today.    Assessment/Plan: Active Problems:   Essential hypertension   Diabetes mellitus without complication (HCC)   AKI (acute kidney injury) ([place])   Watery diarrhea  Acute diastolic CHF exacerbation 2D echo done on 07/03/2018 revealed LVEF 60 to 65% with normal wall motion Cardiology consulted and following Strict I's and O's and daily weight Cardiac meds per cardiology  Elevated troponin most likely demand ischemia Peaked at 0.14 and trended down Personally reviewed twelve-lead EKG done on 07/02/2018 which revealed sinus rhythm rate of 77 with no specific ST-T changes. Cardiology recommends cardiac catheterization to define coronary anatomy Left heart cath on 07/04/2018 Monitor on telemetry  Resolved hypokalemia After repletion  Hypertension Resume home medications Continue Irbesartan Continue Lopressor 25 twice daily per cardiology  Hyperlipidemia Resume home medications Continue Lipitor 40 mg daily per cardiology  Obesity Weight loss outpatient  CKD 3, stable Creatinine improving from 1.20 to 1.01 Creatinine at baseline, with GFR of 52 Avoid nephrotoxic  agent/hypotension/dehydration Repeat BMP in the morning     Code Status: Full  Family Communication: None bedside  Disposition Plan: Home when cardiology signs off.   Consultants:  Cardiology  Procedures:  None  Antimicrobials: None  DVT prophylaxis: Heparin drip   Objective: Vitals:   07/01/18 0453 07/01/18 1505 07/02/18 0410 07/02/18 0500  BP:  (!) 145/61 (!) 130/59   Pulse:  66 64   Resp:  20 18   Temp:  98.1 F (36.7 C) 98.7 F (37.1 C)   TempSrc:  Oral Oral   SpO2:  98% 95%   Weight: 88.1 kg   85.7 kg  Height:       No intake or output data in the 24 hours ending 07/04/18 1528 Filed Weights   06/29/18 0526 07/01/18 0453 07/02/18 0500  Weight: 88.1 kg 88.1 kg 85.7 kg    Exam:  . General: 82 y.o. year-old female well-developed well-nourished no acute distress.  Alert and oriented x3. . Cardiovascular: Regular rate and rhythm with no rubs or gallops.  No thyromegaly or JVD noted.   Marland Kitchen Respiratory: Clear to auscultation with no wheezes or rales. Good inspiratory effort. . Abdomen: Soft nontender nondistended with normal bowel sounds x4 quadrants. . Musculoskeletal: No lower extremity edema. 2/4 pulses in all 4 extremities. . Skin: No ulcerative lesions noted or rashes . Psychiatry: Mood is appropriate for condition and setting   Data Reviewed: CBC: Recent Labs  Lab 06/28/18 1134 06/28/18 1745 06/29/18 0613 06/30/18 0548 07/02/18 2106 07/04/18 0526  WBC 5.6 5.4 4.9 7.0 10.5 11.1*  NEUTROABS 3.4  --   --  5.1 8.7*  --   HGB  13.1 11.6* 11.8* 10.6* 12.0 11.6*  HCT 39.3 35.5* 35.8* 31.6* 35.9* 35.1*  MCV 86.9 88.1 87.3 86.6 85.7 86.0  PLT 172 173 167 189 220 578   Basic Metabolic Panel: Recent Labs  Lab 06/29/18 0613 06/30/18 0548 07/01/18 0558 07/02/18 0611 07/02/18 2106 07/03/18 0657 07/04/18 0526  NA 141 139 142 139 140 140 137  K 4.0 4.0 3.9 3.7 3.7 3.3* 3.7  CL 110 109 111 112* 107 104 104  CO2 22 22 22  20* 22 25 23   GLUCOSE  124* 107* 109* 105* 143* 123* 134*  BUN 26* 24* 19 14 12 11 14   CREATININE 2.32* 1.74* 1.51* 1.15* 1.10* 1.20* 1.01*  CALCIUM 8.8* 8.4* 8.2* 7.9* 8.4* 8.4* 8.1*  MG 1.6* 1.9  --   --   --   --   --    GFR: Estimated Creatinine Clearance: 46.8 mL/min (A) (by C-G formula based on SCr of 1.01 mg/dL (H)). Liver Function Tests: Recent Labs  Lab 06/28/18 1134 06/29/18 0613 07/02/18 2106  AST 29 23 28   ALT 20 17 19   ALKPHOS 61 56 60  BILITOT 0.5 0.7 0.3  PROT 7.5 6.3* 7.2  ALBUMIN 3.6 3.1* 3.4*   Recent Labs  Lab 06/28/18 1134  LIPASE 29   No results for input(s): AMMONIA in the last 168 hours. Coagulation Profile: Recent Labs  Lab 07/03/18 0954  INR 1.28   Cardiac Enzymes: Recent Labs  Lab 07/02/18 2106 07/03/18 0050 07/03/18 0657 07/03/18 1222  TROPONINI 0.14* 0.13* 0.11* 0.10*   BNP (last 3 results) No results for input(s): PROBNP in the last 8760 hours. HbA1C: No results for input(s): HGBA1C in the last 72 hours. CBG: Recent Labs  Lab 07/03/18 1138 07/03/18 1615 07/03/18 2050 07/04/18 0723 07/04/18 1125  GLUCAP 188* 147* 142* 138* 111*   Lipid Profile: Recent Labs    07/04/18 0526  CHOL 85  HDL 24*  LDLCALC 35  TRIG 128  CHOLHDL 3.5   Thyroid Function Tests: No results for input(s): TSH, T4TOTAL, FREET4, T3FREE, THYROIDAB in the last 72 hours. Anemia Panel: No results for input(s): VITAMINB12, FOLATE, FERRITIN, TIBC, IRON, RETICCTPCT in the last 72 hours. Urine analysis:    Component Value Date/Time   COLORURINE YELLOW 06/28/2018 1340   APPEARANCEUR CLEAR 06/28/2018 1340   LABSPEC <1.005 (L) 06/28/2018 1340   PHURINE 6.0 06/28/2018 1340   GLUCOSEU NEGATIVE 06/28/2018 1340   HGBUR NEGATIVE 06/28/2018 1340   BILIRUBINUR NEGATIVE 06/28/2018 1340   KETONESUR NEGATIVE 06/28/2018 1340   PROTEINUR NEGATIVE 06/28/2018 1340   NITRITE NEGATIVE 06/28/2018 1340   LEUKOCYTESUR NEGATIVE 06/28/2018 1340   Sepsis  Labs: @LABRCNTIP (procalcitonin:4,lacticidven:4)  ) Recent Results (from the past 240 hour(s))  C difficile quick scan w PCR reflex     Status: None   Collection Time: 06/28/18  3:35 PM  Result Value Ref Range Status   C Diff antigen NEGATIVE NEGATIVE Final   C Diff toxin NEGATIVE NEGATIVE Final   C Diff interpretation No C. difficile detected.  Final    Comment: Performed at [facility], [place] [address] [place], [place] [address]  Gastrointestinal Panel by PCR , Stool     Status: Abnormal   Collection Time: 06/28/18  3:35 PM  Result Value Ref Range Status   Campylobacter species NOT DETECTED NOT DETECTED Final   Plesimonas shigelloides NOT DETECTED NOT DETECTED Final   Salmonella species NOT DETECTED NOT DETECTED Final   Yersinia enterocolitica NOT DETECTED NOT DETECTED Final  Vibrio species NOT DETECTED NOT DETECTED Final   Vibrio cholerae NOT DETECTED NOT DETECTED Final   Enteroaggregative E coli (EAEC) NOT DETECTED NOT DETECTED Final   Enteropathogenic E coli (EPEC) DETECTED (A) NOT DETECTED Final    Comment: RESULT CALLED TO, READ BACK BY AND VERIFIED WITH: THEHAITSE GEBRU @2041  06/29/18 AKT    Enterotoxigenic E coli (ETEC) NOT DETECTED NOT DETECTED Final   Shiga like toxin producing E coli (STEC) NOT DETECTED NOT DETECTED Final   Shigella/Enteroinvasive E coli (EIEC) NOT DETECTED NOT DETECTED Final   Cryptosporidium NOT DETECTED NOT DETECTED Final   Cyclospora cayetanensis NOT DETECTED NOT DETECTED Final   Entamoeba histolytica NOT DETECTED NOT DETECTED Final   Giardia lamblia NOT DETECTED NOT DETECTED Final   Adenovirus F40/41 NOT DETECTED NOT DETECTED Final   Astrovirus NOT DETECTED NOT DETECTED Final   Norovirus GI/GII NOT DETECTED NOT DETECTED Final   Rotavirus A NOT DETECTED NOT DETECTED Final   Sapovirus (I, II, IV, and V) NOT DETECTED NOT DETECTED Final    Comment: Performed at [facility], [address], [place], [place] [address]       Studies: No results found.  Scheduled Meds:   Continuous Infusions:    LOS: 4 days     Kayleen Memos, MD [facility] Pager [phone]  If 7PM-7AM, please contact night-coverage [email or web address] Password [facility] 07/04/2018, 3:28 PM

## 2018-07-05 ENCOUNTER — Encounter (HOSPITAL_COMMUNITY): Payer: Self-pay | Admitting: Interventional Cardiology

## 2018-07-05 DIAGNOSIS — I5031 Acute diastolic (congestive) heart failure: Secondary | ICD-10-CM | POA: Diagnosis not present

## 2018-07-05 DIAGNOSIS — R748 Abnormal levels of other serum enzymes: Secondary | ICD-10-CM | POA: Diagnosis not present

## 2018-07-05 DIAGNOSIS — I10 Essential (primary) hypertension: Secondary | ICD-10-CM | POA: Diagnosis not present

## 2018-07-05 LAB — GLUCOSE, CAPILLARY
GLUCOSE-CAPILLARY: 135 mg/dL — AB (ref 70–99)
Glucose-Capillary: 170 mg/dL — ABNORMAL HIGH (ref 70–99)

## 2018-07-05 MED ORDER — METOPROLOL TARTRATE 25 MG PO TABS
12.5000 mg | ORAL_TABLET | Freq: Two times a day (BID) | ORAL | Status: DC
  Administered 2018-07-05: 12.5 mg via ORAL
  Filled 2018-07-05: qty 1

## 2018-07-05 MED ORDER — ATORVASTATIN CALCIUM 40 MG PO TABS: 40.0000 mg | ORAL_TABLET | Freq: Every day | ORAL | 0 refills | Status: DC

## 2018-07-05 MED ORDER — LACTULOSE 10 GM/15ML PO SOLN
10.0000 g | Freq: Once | ORAL | Status: AC
  Administered 2018-07-05: 10 g via ORAL
  Filled 2018-07-05: qty 30

## 2018-07-05 MED ORDER — METOPROLOL TARTRATE 25 MG PO TABS: 12.5000 mg | ORAL_TABLET | Freq: Two times a day (BID) | ORAL | 0 refills | Status: DC

## 2018-07-05 MED ORDER — METFORMIN HCL 500 MG PO TABS: 500.0000 mg | ORAL_TABLET | Freq: Two times a day (BID) | ORAL | 0 refills | Status: DC

## 2018-07-05 NOTE — Discharge Summary (Signed)
Physician Discharge Summary  Andrea Brooks YHC:[id number] DOB: [birth year] DOA: 07/02/2018  PCP: Esaw Grandchild, NP  Admit date: 07/02/2018 Discharge date: 07/05/2018  Admitted From: home  Disposition: home Recommendations for Outpatient Follow-up:  1. Follow up with PCP in 1-2 weeks 2. Please obtain BMP/CBC in one week 3. Follow-up with cardiology on August 30th.   Home Health:none Equipment/Devices none Discharge Condition: stable CODE STATUS full Diet recommendation cardiac brief/Interim Summary:81 y.o.femalewith medical history significant ofDM2, HTN. Patient was recently admitted to our service on 8/6, discharged earlier today due to AKI associated with E.Coli diarrhea. Diarrhea and AKI resolved (creat back down to 1.1, and one formed stool this AM, none since today). However, patient had SOB, chest tightness at home. She remains off of Losartan at home which was held for AKI during admit. Presents to ED. Trop 0.14, BNP 345. CTA chest shows no PE, shows small B pleural effusions. Cardiology consulted and following.  07/04/18: Seen and examined at her bedside this morning. Reports persistent mild discomfort to left chest and jaw. Taken to the cath lab and returned this evening.   Discharge Diagnoses:  Principal Problem:   Acute diastolic CHF (congestive heart failure) (HCC) Active Problems:   Essential hypertension   Diabetes mellitus without complication (HCC)   Elevated troponin   Acute congestive heart failure (HCC)   Non-ST elevation (NSTEMI) myocardial infarction (HCC)  nstemi-s/p cath-nonobstructive CAD.continue apirin,ace and metoprolol.  Follow-up with cardiology. Hypertension BP is stable Continue Irbesartan C/w Lopressor 25 twice daily per cardiology  Hyperlipidemia C/w Lipitor 40 mg daily per cardiology  CKD 3 Creatinine appears to be at baseline  DM restart metformin 07/07/18 due to recent cath.  Constipation she was given a dose of lactulose on  the day of discharge constipation was resolved.   Discharge Instructions   Allergies as of 07/05/2018   No Known Allergies     Medication List    TAKE these medications   acetaminophen 500 MG tablet Commonly known as:  TYLENOL Take 1,000 mg by mouth every 6 (six) hours as needed for moderate pain.   aspirin EC 81 MG tablet Take 81 mg by mouth daily.   atorvastatin 40 MG tablet Commonly known as:  LIPITOR Take 1 tablet (40 mg total) by mouth daily at 6 PM.   dicyclomine 10 MG capsule Commonly known as:  BENTYL Take 1 capsule (10 mg total) by mouth 3 (three) times daily as needed for spasms.   ezetimibe-simvastatin 10-20 MG tablet Commonly known as:  VYTORIN Take 1 tablet by mouth daily.   LORazepam 1 MG tablet Commonly known as:  ATIVAN Take 1 tablet (1 mg total) by mouth daily as needed for anxiety.   metFORMIN 500 MG tablet Commonly known as:  GLUCOPHAGE Take 1 tablet (500 mg total) by mouth 2 (two) times daily with a meal. Start taking 07/07/2018 What changed:  additional instructions   pantoprazole 20 MG tablet Commonly known as:  PROTONIX Take 1 tablet (20 mg total) by mouth daily.   valsartan 160 MG tablet Commonly known as:  DIOVAN Take 1 tablet (160 mg total) by mouth daily. PATIENT MUST HAVE OFFICE VISIT PRIOR TO ANY FURTHER REFILLS What changed:  additional instructions   zolpidem 5 MG tablet Commonly known as:  AMBIEN Take 1 tablet (5 mg total) by mouth at bedtime as needed for sleep.      Follow-up Information    Hilty, Nadean Corwin, MD Follow up.   Specialty:  Cardiology Why:  Cardiology  hospital follow up on August 30th at 10:00. Please arrive 15 minutes early for check in.  Contact information: [place] [address] [phone]          No Known Allergies  Consultations:  Cardiology   Procedures/Studies: Ct Abdomen Pelvis Wo Contrast  Result Date: 06/28/2018 CLINICAL DATA:  Diarrhea over the last 4  days.  Elevated creatinine. EXAM: CT ABDOMEN AND PELVIS WITHOUT CONTRAST TECHNIQUE: Multidetector CT imaging of the abdomen and pelvis was performed following the standard protocol without IV contrast. COMPARISON:  None. FINDINGS: Lower chest: Normal except for a small calcified granuloma in the right lower lobe. Hepatobiliary: No liver parenchymal abnormality is seen. Previous cholecystectomy. Pancreas: Normal Spleen: Normal Adrenals/Urinary Tract: Adrenal glands are normal. The right kidney is normal. Left kidney is normal except for a 2.7 cm cyst in the lateral midportion. No hydronephrosis. No bladder abnormality seen. Stomach/Bowel: No sign of bowel obstruction. Liquid stool noted within the colon consistent with the clinical history of diarrhea. Sigmoid diverticulosis without imaging evidence of diverticulitis presently. No sign of bowel wall thickening. Vascular/Lymphatic: Aortic atherosclerosis. No aneurysm. IVC is normal. No retroperitoneal adenopathy. Reproductive: Previous hysterectomy.  No pelvic mass. Other: No free fluid or air. Musculoskeletal: Ordinary chronic lower lumbar degenerative changes. IMPRESSION: Liquid stool throughout the colon consistent with the clinical history of diarrhea. No evidence of bowel obstruction or convincing bowel wall edema. Previous cholecystectomy.  Previous hysterectomy. Aortic atherosclerosis. Left renal cyst. Sigmoid diverticulosis without evidence of diverticulitis. Low level diverticulitis can be inapparent at imaging. Electronically Signed   By: Nelson Chimes M.D.   On: 06/28/2018 14:53   Dg Chest 2 View  Result Date: 07/02/2018 CLINICAL DATA:  Shortness of breath. EXAM: CHEST - 2 VIEW COMPARISON:  None FINDINGS: Mild cardiac enlargement. Pulmonary vascular congestion. No airspace opacities. The visualized skeletal structures are unremarkable. IMPRESSION: 1. Cardiac enlargement and pulmonary vascular congestion. Electronically Signed   By: Kerby Moors M.D.    On: 07/02/2018 20:48   Ct Angio Chest Pe W/cm &/or Wo Cm  Result Date: 07/02/2018 CLINICAL DATA:  Intermediate probability for pulmonary embolus. Mid sternal chest pain. EXAM: CT ANGIOGRAPHY CHEST WITH CONTRAST TECHNIQUE: Multidetector CT imaging of the chest was performed using the standard protocol during bolus administration of intravenous contrast. Multiplanar CT image reconstructions and MIPs were obtained to evaluate the vascular anatomy. CONTRAST:  93mL ISOVUE-370 IOPAMIDOL (ISOVUE-370) INJECTION 76% COMPARISON:  None. FINDINGS: Cardiovascular: Moderate cardiac enlargement. No pericardial effusion. Aortic atherosclerosis. Calcification in the LAD and RCA coronary artery noted. The main pulmonary artery appears patent. No saddle embolus. No lobar or segmental pulmonary artery filling defects identified. Mediastinum/Nodes: Normal appearance of the thyroid gland. There is diminished AP diameter of the trachea which may be a manifestation of tracheobronchomalacia. Normal appearance of the esophagus. No supraclavicular, axillary or mediastinal adenopathy no hilar adenopathy. Lungs/Pleura: Small bilateral pleural effusions noted, left greater than right. Subsegmental atelectasis within the left lower lobe. Mosaic attenuation pattern is identified bilaterally which may be a manifestation of small airways disease. Upper Abdomen: No acute abnormality. Musculoskeletal: No chest wall abnormality. No acute or significant osseous findings. Review of the MIP images confirms the above findings. IMPRESSION: 1. No evidence for acute pulmonary embolus. 2. Small bilateral pleural effusions left greater than right. 3. Multi vessel coronary artery atherosclerotic calcifications. Aortic Atherosclerosis (ICD10-I70.0). Electronically Signed   By: Kerby Moors M.D.   On: 07/02/2018 22:45    (Echo, Carotid, EGD, Colonoscopy, ERCP)  Subjective:   Discharge Exam: Vitals:   07/05/18 0530 07/05/18 1401  BP: 139/62 (!)  134/58  Pulse: 74 77  Resp: 18   Temp: 98.2 F (36.8 C) 98.9 F (37.2 C)  SpO2: 94% 94%   Vitals:   07/04/18 1920 07/04/18 2134 07/05/18 0530 07/05/18 1401  BP: (!) 152/56 138/62 139/62 (!) 134/58  Pulse: 82 82 74 77  Resp: (!) 26 19 18    Temp:  99.3 F (37.4 C) 98.2 F (36.8 C) 98.9 F (37.2 C)  TempSrc:  Oral Oral Oral  SpO2: 97% 98% 94% 94%  Weight:   84.8 kg   Height:        General: Pt is alert, awake, not in acute distress Cardiovascular: RRR, S1/S2 +, no rubs, no gallops Respiratory: CTA bilaterally, no wheezing, no rhonchi Abdominal: Soft, NT, ND, bowel sounds + Extremities: no edema, no cyanosis    The results of significant diagnostics from this hospitalization (including imaging, microbiology, ancillary and laboratory) are listed below for reference.     Microbiology: Recent Results (from the past 240 hour(s))  C difficile quick scan w PCR reflex     Status: None   Collection Time: 06/28/18  3:35 PM  Result Value Ref Range Status   C Diff antigen NEGATIVE NEGATIVE Final   C Diff toxin NEGATIVE NEGATIVE Final   C Diff interpretation No C. difficile detected.  Final    Comment: Performed at [facility], [place] [address] [place], [place] [address]  Gastrointestinal Panel by PCR , Stool     Status: Abnormal   Collection Time: 06/28/18  3:35 PM  Result Value Ref Range Status   Campylobacter species NOT DETECTED NOT DETECTED Final   Plesimonas shigelloides NOT DETECTED NOT DETECTED Final   Salmonella species NOT DETECTED NOT DETECTED Final   Yersinia enterocolitica NOT DETECTED NOT DETECTED Final   Vibrio species NOT DETECTED NOT DETECTED Final   Vibrio cholerae NOT DETECTED NOT DETECTED Final   Enteroaggregative E coli (EAEC) NOT DETECTED NOT DETECTED Final   Enteropathogenic E coli (EPEC) DETECTED (A) NOT DETECTED Final    Comment: RESULT CALLED TO, READ BACK BY AND VERIFIED WITH: THEHAITSE GEBRU @2041  06/29/18 AKT    Enterotoxigenic E coli (ETEC)  NOT DETECTED NOT DETECTED Final   Shiga like toxin producing E coli (STEC) NOT DETECTED NOT DETECTED Final   Shigella/Enteroinvasive E coli (EIEC) NOT DETECTED NOT DETECTED Final   Cryptosporidium NOT DETECTED NOT DETECTED Final   Cyclospora cayetanensis NOT DETECTED NOT DETECTED Final   Entamoeba histolytica NOT DETECTED NOT DETECTED Final   Giardia lamblia NOT DETECTED NOT DETECTED Final   Adenovirus F40/41 NOT DETECTED NOT DETECTED Final   Astrovirus NOT DETECTED NOT DETECTED Final   Norovirus GI/GII NOT DETECTED NOT DETECTED Final   Rotavirus A NOT DETECTED NOT DETECTED Final   Sapovirus (I, II, IV, and V) NOT DETECTED NOT DETECTED Final    Comment: Performed at [facility], [place] [address] [place], [place] [address]     Labs: BNP (last 3 results) Recent Labs    07/02/18 2048  BNP 703.5*   Basic Metabolic Panel: Recent Labs  Lab 06/29/18 0613 06/30/18 0548 07/01/18 0558 07/02/18 0611 07/02/18 2106 07/03/18 0657 07/04/18 0526  NA 141 139 142 139 140 140 137  K 4.0 4.0 3.9 3.7 3.7 3.3* 3.7  CL 110 109 111 112* 107 104 104  CO2 22 22 22  20* 22 25 23   GLUCOSE 124* 107* 109*  105* 143* 123* 134*  BUN 26* 24* 19 14 12 11 14   CREATININE 2.32* 1.74* 1.51* 1.15* 1.10* 1.20* 1.01*  CALCIUM 8.8* 8.4* 8.2* 7.9* 8.4* 8.4* 8.1*  MG 1.6* 1.9  --   --   --   --   --    Liver Function Tests: Recent Labs  Lab 06/29/18 0613 07/02/18 2106  AST 23 28  ALT 17 19  ALKPHOS 56 60  BILITOT 0.7 0.3  PROT 6.3* 7.2  ALBUMIN 3.1* 3.4*   No results for input(s): LIPASE, AMYLASE in the last 168 hours. No results for input(s): AMMONIA in the last 168 hours. CBC: Recent Labs  Lab 06/28/18 1745 06/29/18 0613 06/30/18 0548 07/02/18 2106 07/04/18 0526  WBC 5.4 4.9 7.0 10.5 11.1*  NEUTROABS  --   --  5.1 8.7*  --   HGB 11.6* 11.8* 10.6* 12.0 11.6*  HCT 35.5* 35.8* 31.6* 35.9* 35.1*  MCV 88.1 87.3 86.6 85.7 86.0  PLT 173 167 189 220 244   Cardiac Enzymes: Recent Labs   Lab 07/02/18 2106 07/03/18 0050 07/03/18 0657 07/03/18 1222  TROPONINI 0.14* 0.13* 0.11* 0.10*   BNP: Invalid input(s): POCBNP CBG: Recent Labs  Lab 07/04/18 1125 07/04/18 1632 07/04/18 2139 07/05/18 0730 07/05/18 1154  GLUCAP 111* 100* 196* 135* 170*   D-Dimer Recent Labs    07/02/18 2106  DDIMER 1.31*   Hgb A1c No results for input(s): HGBA1C in the last 72 hours. Lipid Profile Recent Labs    07/04/18 0526  CHOL 85  HDL 24*  LDLCALC 35  TRIG 128  CHOLHDL 3.5   Thyroid function studies No results for input(s): TSH, T4TOTAL, T3FREE, THYROIDAB in the last 72 hours.  Invalid input(s): FREET3 Anemia work up No results for input(s): VITAMINB12, FOLATE, FERRITIN, TIBC, IRON, RETICCTPCT in the last 72 hours. Urinalysis    Component Value Date/Time   COLORURINE YELLOW 06/28/2018 1340   APPEARANCEUR CLEAR 06/28/2018 1340   LABSPEC <1.005 (L) 06/28/2018 1340   PHURINE 6.0 06/28/2018 1340   GLUCOSEU NEGATIVE 06/28/2018 1340   HGBUR NEGATIVE 06/28/2018 1340   BILIRUBINUR NEGATIVE 06/28/2018 1340   KETONESUR NEGATIVE 06/28/2018 1340   PROTEINUR NEGATIVE 06/28/2018 1340   NITRITE NEGATIVE 06/28/2018 1340   LEUKOCYTESUR NEGATIVE 06/28/2018 1340   Sepsis Labs Invalid input(s): PROCALCITONIN,  WBC,  LACTICIDVEN Microbiology Recent Results (from the past 240 hour(s))  C difficile quick scan w PCR reflex     Status: None   Collection Time: 06/28/18  3:35 PM  Result Value Ref Range Status   C Diff antigen NEGATIVE NEGATIVE Final   C Diff toxin NEGATIVE NEGATIVE Final   C Diff interpretation No C. difficile detected.  Final    Comment: Performed at [facility], [place] [address], [place], [place] [address]  Gastrointestinal Panel by PCR , Stool     Status: Abnormal   Collection Time: 06/28/18  3:35 PM  Result Value Ref Range Status   Campylobacter species NOT DETECTED NOT DETECTED Final   Plesimonas shigelloides NOT DETECTED NOT DETECTED Final   Salmonella  species NOT DETECTED NOT DETECTED Final   Yersinia enterocolitica NOT DETECTED NOT DETECTED Final   Vibrio species NOT DETECTED NOT DETECTED Final   Vibrio cholerae NOT DETECTED NOT DETECTED Final   Enteroaggregative E coli (EAEC) NOT DETECTED NOT DETECTED Final   Enteropathogenic E coli (EPEC) DETECTED (A) NOT DETECTED Final    Comment: RESULT CALLED TO, READ BACK BY AND VERIFIED WITH: THEHAITSE GEBRU @2041   06/29/18 AKT    Enterotoxigenic E coli (ETEC) NOT DETECTED NOT DETECTED Final   Shiga like toxin producing E coli (STEC) NOT DETECTED NOT DETECTED Final   Shigella/Enteroinvasive E coli (EIEC) NOT DETECTED NOT DETECTED Final   Cryptosporidium NOT DETECTED NOT DETECTED Final   Cyclospora cayetanensis NOT DETECTED NOT DETECTED Final   Entamoeba histolytica NOT DETECTED NOT DETECTED Final   Giardia lamblia NOT DETECTED NOT DETECTED Final   Adenovirus F40/41 NOT DETECTED NOT DETECTED Final   Astrovirus NOT DETECTED NOT DETECTED Final   Norovirus GI/GII NOT DETECTED NOT DETECTED Final   Rotavirus A NOT DETECTED NOT DETECTED Final   Sapovirus (I, II, IV, and V) NOT DETECTED NOT DETECTED Final    Comment: Performed at [facility], [address], [place], [place] [address]     Time coordinating discharge:  35  minutes  SIGNED:   Georgette Shell, MD  [facility] 07/05/2018, 2:09 PM Pager   If 7PM-7AM, please contact night-coverage [email or web address] Password [email or web address]

## 2018-07-05 NOTE — Progress Notes (Signed)
Patient discharge teaching given, including activity, diet, follow-up appoints, and medications. Patient verbalized understanding of all discharge instructions. IV access was d/c'd. Vitals are stable. Skin is intact except as charted in most recent assessments. Pt to be escorted out by NT, to be driven home by family. 

## 2018-07-05 NOTE — Progress Notes (Addendum)
Pt continue to decline HH at present time. Pt is home with her husband.

## 2018-07-05 NOTE — Progress Notes (Signed)
Progress Note  Patient Name: Andrea Brooks Date of Encounter: 07/05/2018  Primary Cardiologist: Fransico Him, MD   Subjective   Feeling well.  No chest pain or shortness of breath.   Inpatient Medications    Scheduled Meds: . aspirin EC  81 mg Oral Daily  . atorvastatin  40 mg Oral q1800  . heparin  5,000 Units Subcutaneous Q8H  . insulin aspart  0-9 Units Subcutaneous TID WC  . irbesartan  150 mg Oral Daily  . nitroGLYCERIN  0.5 inch Topical TID  . pantoprazole  20 mg Oral Daily  . sodium chloride flush  3 mL Intravenous Q12H  . sodium chloride flush  3 mL Intravenous Q12H   Continuous Infusions: . sodium chloride Stopped (07/05/18 0806)  . sodium chloride     PRN Meds: sodium chloride, sodium chloride, acetaminophen, dicyclomine, LORazepam, ondansetron (ZOFRAN) IV, sodium chloride flush, sodium chloride flush, zolpidem   Vital Signs    Vitals:   07/04/18 1850 07/04/18 1920 07/04/18 2134 07/05/18 0530  BP: (!) 157/59 (!) 152/56 138/62 139/62  Pulse: 78 82 82 74  Resp: (!) 23 (!) 26 19 18   Temp:   99.3 F (37.4 C) 98.2 F (36.8 C)  TempSrc:   Oral Oral  SpO2: 97% 97% 98% 94%  Weight:    84.8 kg  Height:        Intake/Output Summary (Last 24 hours) at 07/05/2018 1107 Last data filed at 07/05/2018 0720 Gross per 24 hour  Intake 120 ml  Output 600 ml  Net -480 ml   Filed Weights   07/03/18 0500 07/04/18 0529 07/05/18 0530  Weight: 84.6 kg 84.6 kg 84.8 kg    Telemetry    Sinus rhythm 60's-70's - Personally Reviewed  ECG    No new tracings - Personally Reviewed  Physical Exam   GEN: No acute distress.   Neck: No JVD Cardiac: RRR, no murmurs, rubs, or gallops.  Respiratory: Clear to auscultation bilaterally. GI: Soft, nontender, non-distended  MS: No edema; No deformity. Neuro:  Nonfocal  Psych: Normal affect   Labs    Chemistry Recent Labs  Lab 06/28/18 1134  06/29/18 0613  07/02/18 2106 07/03/18 0657 07/04/18 0526  NA 138  --  141    < > 140 140 137  K 3.9  --  4.0   < > 3.7 3.3* 3.7  CL 105  --  110   < > 107 104 104  CO2 22  --  22   < > 22 25 23   GLUCOSE 114*  --  124*   < > 143* 123* 134*  BUN 25*  --  26*   < > 12 11 14   CREATININE 2.53*   < > 2.32*   < > 1.10* 1.20* 1.01*  CALCIUM 8.7*  --  8.8*   < > 8.4* 8.4* 8.1*  PROT 7.5  --  6.3*  --  7.2  --   --   ALBUMIN 3.6  --  3.1*  --  3.4*  --   --   AST 29  --  23  --  28  --   --   ALT 20  --  17  --  19  --   --   ALKPHOS 61  --  56  --  60  --   --   BILITOT 0.5  --  0.7  --  0.3  --   --   GFRNONAA 17*   < >  19*   < > 46* 42* 52*  GFRAA 20*   < > 22*   < > 54* 48* 60*  ANIONGAP 11  --  9   < > 11 11 10    < > = values in this interval not displayed.     Hematology Recent Labs  Lab 06/30/18 0548 07/02/18 2106 07/04/18 0526  WBC 7.0 10.5 11.1*  RBC 3.65* 4.19 4.08  HGB 10.6* 12.0 11.6*  HCT 31.6* 35.9* 35.1*  MCV 86.6 85.7 86.0  MCH 29.0 28.6 28.4  MCHC 33.5 33.4 33.0  RDW 14.3 13.8 14.4  PLT 189 220 244    Cardiac Enzymes Recent Labs  Lab 07/02/18 2106 07/03/18 0050 07/03/18 0657 07/03/18 1222  TROPONINI 0.14* 0.13* 0.11* 0.10*    Recent Labs  Lab 07/02/18 2115  TROPIPOC 0.09*     BNP Recent Labs  Lab 07/02/18 2048  BNP 345.5*     DDimer  Recent Labs  Lab 07/02/18 2106  DDIMER 1.31*     Radiology    No results found.  Cardiac Studies   LEFT HEART CATH AND CORONARY ANGIOGRAPHY 07/04/18  Conclusion    Prox LAD lesion is 25% stenosed.  Mid RCA lesion is 10% stenosed.  Prox Cx to Mid Cx lesion is 10% stenosed.  LV end diastolic pressure is mildly elevated. LVEDP 16 mm Hg.  There is no aortic valve stenosis.   Nonobstructive CAD.    Continue aggressive secondary prevention and management of diastolic heart failure.      Echocardiogram 07/03/2018 Study Conclusions - Left ventricle: The cavity size was normal. Wall thickness was increased in a pattern of moderate LVH. Systolic function was normal.  The estimated ejection fraction was in the range of 60% to 65%. Diastolic function is abnormal, indeterminant grade. There is evidence of elevated LA pressure. Wall motion was normal; there were no regional wall motion abnormalities. - Aortic valve: Valve area (VTI): 2.01 cm^2. Valve area (Vmax): 2.11 cm^2. Valve area (Vmean): 2.38 cm^2. - Mitral valve: There was mild regurgitation. - Left atrium: The atrium was mildly dilated. - Right ventricle: The cavity size was mildly dilated. Wall thickness was normal. - Pulmonary arteries: Systolic pressure was mildly increased. PA peak pressure: 34 mm Hg (S). - Technically adequate study.  Patient Profile     82 y.o. female with history of type 2 diabetes, hypertension, GERD, hyperlipidemia and obesity who was hospitalized last week with acute kidney injury associated with E. coli diarrhea.  On 07/02/2018 she developed shortness of breath and chest pain that radiated to her shoulders.  Troponin 0.14  Assessment & Plan    Elevated troponin -Max troponin 0.14, trended down to 0.10.  BNP 345.5 No evidence of PE on CTA of the chest, but did show small bilateral pleural effusions.  Also noted coronary calcifications of the LAD and RCA with moderate cardiac enlargement on the chest CT. -Patient was taken for cardiac catheterization yesterday for definitive coronary evaluation and found to have minimal nonobstructive disease. LV end diastolic pressure is mildly elevated. LVEDP 16 mm Hg. Recommendation for treatment of Diastolic failure. -Cath site stable.  -Renal function is stable post cath with serum creatinine 1.01 this morning -No further chest pain.  Will discontinue nitroglycerin.  Acute diastolic CHF -Echo was normal LV function -BNP 345 -Bilateral pleural effusions on chest CT -She has received 1 dose of Lasix 40 mg IV -Elevated end diastolic pressure on catheterization -No orthopnea, PND or dyspnea.  -continue good BP  control,  low sodium/heart healthy diet discussed with pt. I will arrange for follow up in our office. Also advised to increase her activity level starting with some seated exercises. She wishes to see Dr. Debara Pickett as he is her husband's cardiologist.   [facility] will sign off.   Medication Recommendations:  Continue current meds Other recommendations (labs, testing, etc):  n/a Follow up as an outpatient:  I will arrange for follow up with Dr. Debara Pickett.  Her husband sees him as well.   For questions or updates, please contact [place] Please consult [email or web address] for contact info under Cardiology/STEMI.      Signed, Toure Edmonds C. Oval Linsey, MD, [facility]  07/05/2018 2:39 PM

## 2018-07-08 ENCOUNTER — Telehealth: Payer: Self-pay | Admitting: Internal Medicine

## 2018-07-08 NOTE — Telephone Encounter (Signed)
Called patient she stated that she had a small knot show up on her arm and was concerned.  Patient denies any pain, swelling, redness, not hot to touch, no tingling in fingers or arm.   Patient only mentions the slight raised area, along with bruising. Patient was advised to continue to ice it as she was doing, and of course to call if any of the above symptoms began, and to rest arm. Patient verbalized understanding.

## 2018-07-08 NOTE — Telephone Encounter (Signed)
New Message    Patient is calling because she just had a heart cath and now just has a knot at the injection site. Please call.

## 2018-07-12 ENCOUNTER — Encounter: Payer: Self-pay | Admitting: Adult Health

## 2018-07-12 ENCOUNTER — Ambulatory Visit: Payer: Medicare Other | Admitting: Adult Health

## 2018-07-12 VITALS — BP 116/77 | HR 93 | Ht 63.75 in | Wt 187.3 lb

## 2018-07-12 DIAGNOSIS — I10 Essential (primary) hypertension: Secondary | ICD-10-CM

## 2018-07-12 DIAGNOSIS — E119 Type 2 diabetes mellitus without complications: Secondary | ICD-10-CM

## 2018-07-12 DIAGNOSIS — R197 Diarrhea, unspecified: Secondary | ICD-10-CM | POA: Diagnosis not present

## 2018-07-12 DIAGNOSIS — N179 Acute kidney failure, unspecified: Secondary | ICD-10-CM | POA: Diagnosis not present

## 2018-07-12 DIAGNOSIS — Z09 Encounter for follow-up examination after completed treatment for conditions other than malignant neoplasm: Secondary | ICD-10-CM | POA: Diagnosis not present

## 2018-07-12 NOTE — Assessment & Plan Note (Signed)
  Continue all medications as directed, with two exceptions- Hold off on stating on Atorvastatin 40mg  until you see Dr. Debara Pickett.  You are already taking Vytorin and your last lipid panel 07/07/18 was stable. Discuss GERD medications with Gastroenterologist tomorrow. Do not take Zolpidem (Ambien) 5mg  Continue to stay well hydrated and follow a Mediterranean diet. Do not over exert yourself until you are seen my Cardiology- follow up in Aug 30,2019 Please keep follow up with GI tomorrow. We will call you when lab results are available. Follow up here in 3 months.

## 2018-07-12 NOTE — Progress Notes (Signed)
Subjective:    Patient ID: Andrea Brooks, female    DOB: March 20, 1936, 82 y.o.   MRN: [id number]  HPI:  Andrea Brooks is here for hospital f/u after TWO recent admissions- 06/28/18-/80/10-19: 82 year old female with medical history significant for type 2 diabetes, anxiety, GERD, hypertension, IBS presented to the ER with worsening diarrhea for the past 5 days with multiple, watery, non-bloody episodes per day. Denies any significant abdominal pain/nausea/vomiting. Reported husband had diarrhea a few weeks ago but resolved spontaneously and was not as bad as has. Patient denies any recent travel or any antibiotic use. Patient has been following with [facility] and has been diagnosed with irritable bowel syndrome (reports alternating diarrhea with constipation). In the ED, patient noted to have significant AKI. Patient admitted for further management. C. difficile negative, stool panel revealed E.coli CT abdomen/pelvis showed liquid stool throughout the colon consistent with the clinical history of diarrhea, no evidence of bowel obstruction. Sigmoid diverticulosis without evidence of diverticulitis Completed IV AzithromycinX 3doses Followed by  GI Normal baseline creatinine, 2.53 on admission-->1.15 S/P IV fluids, encouraged to stay hydrated   07/02/18-07/05/18 discharged earlier today due to AKI associated with E.Coli diarrhea. Diarrhea and AKI resolved (creat back down to 1.1, and one formed stool this AM, none since today). However, patient had SOB, chest tightness at home. She remains off of Losartan at home which was held for AKI during admit. Presents to ED. Trop 0.14, BNP 345. CTA chest shows no PE, shows small B pleural effusions.Cardiology consulted and following. 07/04/18:LEFT HEART CATH AND CORONARY ANGIOGRAPHY nstemi-s/p cath-nonobstructive CAD.continue apirin,ace and metoprolol.  Follow-up with cardiology. Hypertension BP is stable Continue Irbesartan C/wLopressor 25  twice daily per cardiology Hyperlipidemia C/wLipitor 40 mg daily per cardiology CKD 3 Creatinine appears to be at baseline DM restart metformin 07/07/18 due to recent cath.  She has follow up with Cardiology- 07/22/18/Dr. Hilty BMP/CBC drawn today She is feeling "a little better each day" She reports bowel/bladder habits have returned to normal She denies acute cardiac sx's since d/c  Reviewed all hospital notes,labs, imaging, procedures at length  Patient Care Team    Relationship Specialty Notifications Start End  Esaw Grandchild, NP PCP - General Family Medicine  04/22/17   Sueanne Margarita, MD PCP - Cardiology Cardiology Admissions 07/04/18     Patient Active Problem List   Diagnosis Date Noted  . Hospital discharge follow-up 07/12/2018  . Acute congestive heart failure ([place])   . Non-ST elevation (NSTEMI) myocardial infarction ([place])   . Acute diastolic CHF (congestive heart failure) ([place]) 07/02/2018  . Elevated troponin 07/02/2018  . AKI (acute kidney injury) ([place]) 06/28/2018  . Diabetes mellitus without complication ([place]) 03/50/0938  . Diverticulitis-  mild symptoms 03/30/2018  . Irritable bowel syndrome with both constipation and diarrhea 03/30/2018  . Pain of right heel 07/06/2017  . Loose stools 06/24/2017  . Right-sided thoracic back pain 06/24/2017  . Hyperlipidemia 04/22/2017  . GERD (gastroesophageal reflux disease) 04/22/2017  . Impaired glucose metabolism 04/22/2017  . Essential hypertension 04/22/2017  . Anxiety 04/22/2017     Past Medical History:  Diagnosis Date  . Anxiety   . Colon polyps   . Diabetes mellitus without complication ([place])   . Diverticulosis   . Gallstones   . GERD (gastroesophageal reflux disease)   . Hyperlipidemia   . Hypertension   . Obesity      Past Surgical History:  Procedure Laterality Date  . ABDOMINAL HYSTERECTOMY     total  .  BREAST BIOPSY Left    neg  . CHOLECYSTECTOMY    . JOINT REPLACEMENT Bilateral     knee  . LEFT HEART CATH AND CORONARY ANGIOGRAPHY N/A 07/04/2018   Procedure: LEFT HEART CATH AND CORONARY ANGIOGRAPHY;  Surgeon: Jettie Booze, MD;  Location: [facility];  Service: Cardiovascular;  Laterality: N/A;  . REPLACEMENT TOTAL KNEE BILATERAL       Family History  Problem Relation Age of Onset  . Hypertension Mother   . Colon cancer Mother   . Stroke Father   . Heart failure Father   . Stroke Sister   . Stroke Brother   . Graves' disease Daughter   . Cancer Paternal Aunt        breast  . Breast cancer Paternal Aunt   . Graves' disease Sister   . Bone cancer Sister   . Breast cancer Maternal Aunt   . Esophageal cancer Neg Hx   . Rectal cancer Neg Hx   . Liver cancer Neg Hx      Social History   Substance and Sexual Activity  Drug Use No     Social History   Substance and Sexual Activity  Alcohol Use No     Social History   Tobacco Use  Smoking Status Former Smoker  . Packs/day: 1.00  . Years: 2.00  . Pack years: 2.00  . Last attempt to quit: 11/24/1959  . Years since quitting: 58.6  Smokeless Tobacco Never Used     Outpatient Encounter Medications as of 07/12/2018  Medication Sig Note  . acetaminophen (TYLENOL) 500 MG tablet Take 1,000 mg by mouth every 6 (six) hours as needed for moderate pain.   Marland Kitchen aspirin EC 81 MG tablet Take 81 mg by mouth daily.   Marland Kitchen dicyclomine (BENTYL) 10 MG capsule Take 1 capsule (10 mg total) by mouth 3 (three) times daily as needed for spasms.   Marland Kitchen ezetimibe-simvastatin (VYTORIN) 10-20 MG tablet Take 1 tablet by mouth daily.   Marland Kitchen LORazepam (ATIVAN) 1 MG tablet Take 1 tablet (1 mg total) by mouth daily as needed for anxiety.   . metFORMIN (GLUCOPHAGE) 500 MG tablet Take 1 tablet (500 mg total) by mouth 2 (two) times daily with a meal. Start taking 07/07/2018   . valsartan (DIOVAN) 160 MG tablet Take 1 tablet (160 mg total) by mouth daily. PATIENT MUST HAVE OFFICE VISIT PRIOR TO ANY FURTHER REFILLS (Patient taking  differently: Take 160 mg by mouth daily. ) 06/28/2018: On Hold.  . [DISCONTINUED] atorvastatin (LIPITOR) 40 MG tablet Take 1 tablet (40 mg total) by mouth daily at 6 PM.   . [DISCONTINUED] metoprolol tartrate (LOPRESSOR) 25 MG tablet Take 0.5 tablets (12.5 mg total) by mouth 2 (two) times daily.   . [DISCONTINUED] pantoprazole (PROTONIX) 20 MG tablet Take 1 tablet (20 mg total) by mouth daily.   . [DISCONTINUED] zolpidem (AMBIEN) 5 MG tablet Take 1 tablet (5 mg total) by mouth at bedtime as needed for sleep.    No facility-administered encounter medications on file as of 07/12/2018.     Allergies: Patient has no known allergies.  Body mass index is 32.4 kg/m.  Blood pressure 116/77, pulse 93, height 5' 3.75" (1.619 m), weight 187 lb 4.8 oz (85 kg), SpO2 98 %.   Review of Systems  Constitutional: Positive for fatigue. Negative for activity change, appetite change, chills, diaphoresis, fever and unexpected weight change.  Eyes: Negative for visual disturbance.  Respiratory: Negative for cough, chest tightness, shortness  of breath, wheezing and stridor.   Cardiovascular: Negative for chest pain, palpitations and leg swelling.  Gastrointestinal: Negative for abdominal distention, abdominal pain, blood in stool, constipation, diarrhea, nausea and vomiting.  Genitourinary: Negative for difficulty urinating and flank pain.  Musculoskeletal: Positive for arthralgias, joint swelling and myalgias. Negative for back pain, gait problem, neck pain and neck stiffness.  Skin: Positive for color change and wound. Negative for pallor and rash.  Neurological: Negative for dizziness.  Hematological: Does not bruise/bleed easily.  Psychiatric/Behavioral: Negative for behavioral problems, confusion, decreased concentration, dysphoric mood, hallucinations, self-injury, sleep disturbance and suicidal ideas. The patient is nervous/anxious. The patient is not hyperactive.        Objective:   Physical Exam    Constitutional: She is oriented to person, place, and time. She appears well-developed and well-nourished. No distress.  HENT:  Head: Normocephalic and atraumatic.  Right Ear: External ear normal.  Left Ear: External ear normal.  Nose: Nose normal.  Mouth/Throat: Oropharynx is clear and moist.  Eyes: Pupils are equal, round, and reactive to light. Conjunctivae and EOM are normal.  Cardiovascular: Normal rate, regular rhythm, normal heart sounds and intact distal pulses.  No murmur heard. Pulmonary/Chest: Effort normal and breath sounds normal. No stridor. No respiratory distress. She has no wheezes. She has no rales. She exhibits no tenderness.  Neurological: She is alert and oriented to person, place, and time.  Skin: Skin is warm and dry. Capillary refill takes less than 2 seconds. Bruising noted. No rash noted. She is not diaphoretic. No erythema. No pallor.     Ecchymosis noted on R distal radius One small, mobile cyst noted  No open tissue/drainage noted  Psychiatric: She has a normal mood and affect. Her behavior is normal. Judgment and thought content normal.      Assessment & Plan:   1. AKI (acute kidney injury) ([place])   2. Watery diarrhea   3. Hospital discharge follow-up   4. Diabetes mellitus without complication ([place])   5. Essential hypertension     Hospital discharge follow-up  Continue all medications as directed, with two exceptions- Hold off on stating on Atorvastatin 40mg  until you see Dr. Debara Pickett.  You are already taking Vytorin and your last lipid panel 07/07/18 was stable. Discuss GERD medications with Gastroenterologist tomorrow. Do not take Zolpidem (Ambien) 5mg  Continue to stay well hydrated and follow a Mediterranean diet. Do not over exert yourself until you are seen my Cardiology- follow up in Aug 30,2019 Please keep follow up with GI tomorrow. We will call you when lab results are available. Follow up here in 3 months.  AKI (acute kidney injury)  ([place]) 07/12/18 BMP- Improving  Creat 0.96, GFR 56   Diabetes mellitus without complication ([place]) Lab Results  Component Value Date   HGBA1C 6.3 (H) 06/28/2018   HGBA1C 6.9 (A) 06/15/2018   HGBA1C 5.9 03/17/2018    Essential hypertension BP at goal  116/77, HR 93 Currently on Valsartan 160mg  QD  Pt was in the office today for 45+ minutes, I spent >75% of time in face to face counseling of various medical concerns and in coordination of care  FOLLOW-UP:  No follow-ups on file.

## 2018-07-12 NOTE — Patient Instructions (Addendum)
Radial Site Care Refer to this sheet in the next few weeks. These instructions provide you with information about caring for yourself after your procedure. Your health care provider may also give you more specific instructions. Your treatment has been planned according to current medical practices, but problems sometimes occur. Call your health care provider if you have any problems or questions after your procedure. What can I expect after the procedure? After your procedure, it is typical to have the following:  Bruising at the radial site that usually fades within 1-2 weeks.  Blood collecting in the tissue (hematoma) that may be painful to the touch. It should usually decrease in size and tenderness within 1-2 weeks.  Follow these instructions at home:  Take medicines only as directed by your health care provider.  You may shower 24-48 hours after the procedure or as directed by your health care provider. Remove the bandage (dressing) and gently wash the site with plain soap and water. Pat the area dry with a clean towel. Do not rub the site, because this may cause bleeding.  Do not take baths, swim, or use a hot tub until your health care provider approves.  Check your insertion site every day for redness, swelling, or drainage.  Do not apply powder or lotion to the site.  Do not flex or bend the affected arm for 24 hours or as directed by your health care provider.  Do not push or pull heavy objects with the affected arm for 24 hours or as directed by your health care provider.  Do not lift over 10 lb (4.5 kg) for 5 days after your procedure or as directed by your health care provider.  Ask your health care provider when it is okay to: ? Return to work or school. ? Resume usual physical activities or sports. ? Resume sexual activity.  Do not drive home if you are discharged the same day as the procedure. Have someone else drive you.  You may drive 24 hours after the procedure  unless otherwise instructed by your health care provider.  Do not operate machinery or power tools for 24 hours after the procedure.  If your procedure was done as an outpatient procedure, which means that you went home the same day as your procedure, a responsible adult should be with you for the first 24 hours after you arrive home.  Keep all follow-up visits as directed by your health care provider. This is important. Contact a health care provider if:  You have a fever.  You have chills.  You have increased bleeding from the radial site. Hold pressure on the site. Get help right away if:  You have unusual pain at the radial site.  You have redness, warmth, or swelling at the radial site.  You have drainage (other than a small amount of blood on the dressing) from the radial site.  The radial site is bleeding, and the bleeding does not stop after 30 minutes of holding steady pressure on the site.  Your arm or hand becomes pale, cool, tingly, or numb. This information is not intended to replace advice given to you by your health care provider. Make sure you discuss any questions you have with your health care provider. Document Released: 12/12/2010 Document Revised: 04/16/2016 Document Reviewed: 05/28/2014 Elsevier Interactive Patient Education  2018 [place] all medications as directed, with two exceptions- Hold off on stating on Atorvastatin 40mg  until you see Dr. Debara Pickett.  You are already taking  Vytorin and your last lipid panel 07/07/18 was stable. Discuss GERD medications with Gastroenterologist tomorrow. Do not take Zolpidem (Ambien) 5mg  Continue to stay well hydrated and follow a Mediterranean diet. Do not over exert yourself until you are seen my Cardiology- follow up in Aug 30,2019 Please keep follow up with GI tomorrow. We will call you when lab results are available. Follow up here in 3 months. NICE TO SEE YOU!

## 2018-07-13 ENCOUNTER — Encounter

## 2018-07-13 ENCOUNTER — Encounter: Payer: Self-pay | Admitting: Gastroenterology

## 2018-07-13 ENCOUNTER — Ambulatory Visit: Payer: Medicare Other | Admitting: Gastroenterology

## 2018-07-13 VITALS — BP 118/62 | HR 69 | Ht 64.0 in | Wt 186.0 lb

## 2018-07-13 DIAGNOSIS — K580 Irritable bowel syndrome with diarrhea: Secondary | ICD-10-CM

## 2018-07-13 DIAGNOSIS — R140 Abdominal distension (gaseous): Secondary | ICD-10-CM

## 2018-07-13 LAB — CBC WITH DIFFERENTIAL/PLATELET
BASOS: 0 %
Basophils Absolute: 0 10*3/uL (ref 0.0–0.2)
EOS (ABSOLUTE): 0.1 10*3/uL (ref 0.0–0.4)
Eos: 1 %
Hematocrit: 35.5 % (ref 34.0–46.6)
Hemoglobin: 11.1 g/dL (ref 11.1–15.9)
Immature Grans (Abs): 0 10*3/uL (ref 0.0–0.1)
Immature Granulocytes: 0 %
LYMPHS ABS: 1.2 10*3/uL (ref 0.7–3.1)
Lymphs: 14 %
MCH: 27.4 pg (ref 26.6–33.0)
MCHC: 31.3 g/dL — AB (ref 31.5–35.7)
MCV: 88 fL (ref 79–97)
MONOS ABS: 0.8 10*3/uL (ref 0.1–0.9)
Monocytes: 10 %
NEUTROS ABS: 6.3 10*3/uL (ref 1.4–7.0)
NEUTROS PCT: 75 %
PLATELETS: 385 10*3/uL (ref 150–450)
RBC: 4.05 x10E6/uL (ref 3.77–5.28)
RDW: 14.5 % (ref 12.3–15.4)
WBC: 8.5 10*3/uL (ref 3.4–10.8)

## 2018-07-13 LAB — COMPREHENSIVE METABOLIC PANEL
A/G RATIO: 1 — AB (ref 1.2–2.2)
ALT: 13 IU/L (ref 0–32)
AST: 21 IU/L (ref 0–40)
Albumin: 3.5 g/dL (ref 3.5–4.8)
Alkaline Phosphatase: 80 IU/L (ref 39–117)
BUN/Creatinine Ratio: 17 (ref 12–28)
BUN: 16 mg/dL (ref 8–27)
Bilirubin Total: 0.3 mg/dL (ref 0.0–1.2)
CO2: 19 mmol/L — ABNORMAL LOW (ref 20–29)
Calcium: 9.1 mg/dL (ref 8.7–10.3)
Chloride: 104 mmol/L (ref 96–106)
Creatinine, Ser: 0.96 mg/dL (ref 0.57–1.00)
GFR calc Af Amer: 65 mL/min/{1.73_m2} (ref >59)
GFR calc non Af Amer: 56 mL/min/{1.73_m2} — ABNORMAL LOW (ref >59)
Globulin, Total: 3.4 g/dL (ref 1.5–4.5)
Glucose: 157 mg/dL — ABNORMAL HIGH (ref 65–99)
POTASSIUM: 5.5 mmol/L — AB (ref 3.5–5.2)
SODIUM: 142 mmol/L (ref 134–144)
Total Protein: 6.9 g/dL (ref 6.0–8.5)

## 2018-07-13 NOTE — Assessment & Plan Note (Signed)
BP at goal  116/77, HR 93 Currently on Valsartan 160mg  QD

## 2018-07-13 NOTE — Progress Notes (Signed)
Andrea Brooks GI Progress Note  Chief Complaint: Chronic diarrhea  Subjective  History:  This is a very pleasant 82 year old woman last seen for colonoscopy in September 2018 for history of adenomatous colon polyps.  She had seen our PA a month prior to that for over 6 months of chronic diarrhea and weight loss that the patient had largely attributed to stress moving from [place] to [place] to be near her daughter.  The symptoms have largely subsided at the time of office visit. Colonoscopy September 2018 revealed 3 diminutive adenomatous polyps and otherwise normal colon; no additional biopsies were taken because the patient reported resolution of the diarrhea.  No future surveillance colonoscopy was recommended due to her age. She has been on metformin for years and had a prior cholecystectomy.  Andrea Brooks was seen in the ED on August 6 for severe diarrhea with volume depletion and elevated creatinine.  Stool study from August 6 revealed enteropathogenic E. coli for which she was given 3 days of azithromycin. She was then readmitted August 10 with chest pressure, troponin 0 0.14 and elevated BNP at 345.  She is felt to have pulmonary edema and underwent cardiac catheterization.  Impression was nonobstructive coronary disease requiring medical management, status post NSTEMI, according to the discharge summary.  She had brief constipation during that hospital stay relieved with lactulose.  Aug 18 wt 199 lbs, 06/15/18  Wt 194 Jun 28, 2018 194, 07/13/18 186 lbs (after hospital diuresis)   For about 6 weeks prior to the recent admission, she was having more problems with intermittent diarrhea, sometimes a few times per day with some urgency and accidents.  She denies nocturnal diarrhea, her appetite is good but she was still concerned about ongoing weight loss.  Above records indicate that her weight has been overall stable for about the last year. She denies rectal bleeding. Andrea Brooks  previously took the dicyclomine on rare occasions, and it most only ever took it once a day.  She has not needed any since recent hospital discharge.  She had some abdominal cramps and loose stool yesterday.  In fact, her diarrhea symptoms have been improved overall since the recent hospitalization for infection compared to before.  She had originally made this appointment when things were doing poorly.  ROS: Cardiovascular:  no chest pain since recent hospital discharge. Respiratory: no dyspnea She still feels generally fatigued and hopes to get back to feeling like herself  in the next several weeks. Arthralgias Remainder systems negative except as above The patient's Past Medical, Family and Social History were reviewed and are on file in the EMR.  Objective:  Med list reviewed  Current Outpatient Medications:  .  acetaminophen (TYLENOL) 500 MG tablet, Take 1,000 mg by mouth every 6 (six) hours as needed for moderate pain., Disp: , Rfl:  .  aspirin EC 81 MG tablet, Take 81 mg by mouth daily., Disp: , Rfl:  .  dicyclomine (BENTYL) 10 MG capsule, Take 1 capsule (10 mg total) by mouth 3 (three) times daily as needed for spasms., Disp: 60 capsule, Rfl: 0 .  ezetimibe-simvastatin (VYTORIN) 10-20 MG tablet, Take 1 tablet by mouth daily., Disp: , Rfl:  .  LORazepam (ATIVAN) 1 MG tablet, Take 1 tablet (1 mg total) by mouth daily as needed for anxiety., Disp: 30 tablet, Rfl: 0 .  metFORMIN (GLUCOPHAGE) 500 MG tablet, Take 1 tablet (500 mg total) by mouth 2 (two) times daily with a meal. Start taking 07/07/2018, Disp: 180 tablet, Rfl:  0 .  omeprazole (PRILOSEC OTC) 20 MG tablet, Take 20 mg by mouth daily., Disp: , Rfl:    Vital signs in last 24 hrs: Vitals:   07/13/18 1333  BP: 118/62  Pulse: 69    Physical Exam  She is well-appearing, well-hydrated, alert, conversational and pleasant and accompanied by her husband.  HEENT: sclera anicteric, oral mucosa moist without lesions  Neck:  supple, no thyromegaly, JVD or lymphadenopathy  Cardiac: RRR without murmurs, S1S2 heard, no peripheral edema  Pulm: clear to auscultation bilaterally, normal RR and effort noted.  She is not able to completely lay back due to neck pain.  Abdomen: soft, no tenderness, with active bowel sounds. No guarding or palpable hepatosplenomegaly.  Skin; warm and dry, no jaundice or rash  Recent Labs:  CMP Latest Ref Rng & Units 07/12/2018 07/04/2018 07/03/2018  Glucose 65 - 99 mg/dL 157(H) 134(H) 123(H)  BUN 8 - 27 mg/dL 16 14 11   Creatinine 0.57 - 1.00 mg/dL 0.96 1.01(H) 1.20(H)  Sodium 134 - 144 mmol/L 142 137 140  Potassium 3.5 - 5.2 mmol/L 5.5(H) 3.7 3.3(L)  Chloride 96 - 106 mmol/L 104 104 104  CO2 20 - 29 mmol/L 19(L) 23 25  Calcium 8.7 - 10.3 mg/dL 9.1 8.1(L) 8.4(L)  Total Protein 6.0 - 8.5 g/dL 6.9 - -  Total Bilirubin 0.0 - 1.2 mg/dL 0.3 - -  Alkaline Phos 39 - 117 IU/L 80 - -  AST 0 - 40 IU/L 21 - -  ALT 0 - 32 IU/L 13 - -     Radiologic studies:  CT abdomen pelvis 06/28/2018 with copious liquid stool in colon consistent with diarrheal illness.  Diverticulosis without diverticulitis.  Other chronic stable findings as reported.  Full report on file.  @ASSESSMENTPLANBEGIN @ Assessment: Encounter Diagnoses  Name Primary?  . Irritable bowel syndrome with diarrhea Yes  . Abdominal bloating     She has chronic IBS with diarrhea that has come and gone over time.  It was flaring up on her a few months ago for unclear reasons.  It is lately improved for unclear reasons.  Biopsies were not taken during colonoscopy to rule out microscopic colitis because the patient reports that her diarrhea had resolved.  While that remains a possibility, this still seems more like IBS and the weight has behaved.  It also typically responds well to dicyclomine, though not sure she has taken sufficient doses during periods of worsened symptoms to achieve adequate control.  I do not think further GI testing is  needed at this time. Plan: Some written dietary advice related to IBS and gas/bloating were given. As needed use of dicyclomine, can increase to regular dosing as needed. She was encouraged to call me as needed for advice.   Total time 30 minutes, over half spent face-to-face with patient in counseling and coordination of care.  Extensive recent records were reviewed.  Nelida Meuse III

## 2018-07-13 NOTE — Assessment & Plan Note (Signed)
Lab Results  Component Value Date   HGBA1C 6.3 (H) 06/28/2018   HGBA1C 6.9 (A) 06/15/2018   HGBA1C 5.9 03/17/2018

## 2018-07-13 NOTE — Patient Instructions (Addendum)
If you are age 80 or older, your body mass index should be between 23-30. Your Body mass index is 31.93 kg/m. If this is out of the aforementioned range listed, please consider follow up with your Primary Care Provider.  If you are age 47 or younger, your body mass index should be between 19-25. Your Body mass index is 31.93 kg/m. If this is out of the aformentioned range listed, please consider follow up with your Primary Care Provider.   It was a pleasure to see you today!  Dr. Loletha Carrow   Food Guidelines for a sensitive stomach  Many people have difficulty digesting certain foods, causing a variety of distressing and embarrassing symptoms such as abdominal pain, bloating and gas.  These foods may need to be avoided or consumed in small amounts.  Here are some tips that might be helpful for you.  1.   Lactose intolerance is the difficulty or complete inability to digest lactose, the natural sugar in milk and anything made from milk.  This condition is harmless, common, and can begin any time during life.  Some people can digest a modest amount of lactose while others cannot tolerate any.  Also, not all dairy products contain equal amounts of lactose.  For example, hard cheeses such as parmesan have less lactose than soft cheeses such as cheddar.  Yogurt has less lactose than milk or cheese.  Many packaged foods (even many brands of bread) have milk, so read ingredient lists carefully.  It is difficult to test for lactose intolerance, so just try avoiding lactose as much as possible for a week and see what happens with your symptoms.  If you seem to be lactose intolerant, the best plan is to avoid it (but make sure you get calcium from another source).  The next best thing is to use lactase enzyme supplements, available over the counter everywhere.  Just know that many lactose intolerant people need to take several tablets with each serving of dairy to avoid symptoms.  Lastly, a lot of restaurant food is  made with milk or butter.  Many are things you might not suspect, such as mashed potatoes, rice and pasta (cooked with butter) and "grilled" items.  If you are lactose intolerant, it never hurts to ask your server what has milk or butter.  2.   Fiber is an important part of your diet, but not all fiber is well-tolerated.  Insoluble fiber such as bran is often consumed by normal gut bacteria and converted into gas.  Soluble fiber such as oats, squash, carrots and green beans are typically tolerated better.  3.   Some types of carbohydrates can be poorly digested.  Examples include: fructose (apples, cherries, pears, raisins and other dried fruits), fructans (onions, zucchini, large amounts of wheat), sorbitol/mannitol/xylitol and sucralose/Splenda (common artificial sweeteners), and raffinose (lentils, broccoli, cabbage, asparagus, brussel sprouts, many types of beans).  Do a Development worker, community for [employer] and you will find helpful information. Beano, a dietary supplement, will often help with raffinose-containing foods.  As with lactase tablets, you may need several per serving.  4.   Whenever possible, avoid processed food&meats and chemical additives.  High fructose corn syrup, a common sweetener, may be difficult to digest.  Eggs and soy (comes from the soybean, and added to many foods now) are other common bloating/gassy foods.  - Dr. Herma Ard [facility]

## 2018-07-13 NOTE — Assessment & Plan Note (Addendum)
07/12/18 BMP- Improving  Creat 0.96, GFR 56

## 2018-07-14 ENCOUNTER — Telehealth: Payer: Self-pay

## 2018-07-14 NOTE — Telephone Encounter (Signed)
Andrea Brooks is feeling better. She states the GI doctor would like her to stop the Metformin for a few days to see if it is what is causing the stomach issues.   She also received a beta blocker, Lopressor 12.5 mg half tablet bid, and is not happy about taking it. She wants to take Diovan only. Please advise.

## 2018-07-14 NOTE — Telephone Encounter (Signed)
Unable to leave message, voicemail is full.

## 2018-07-14 NOTE — Telephone Encounter (Signed)
Afternoon Levada Dy, Thank you for forwarding the communication. Ms. Rogowski should call her Cardiologist in regards to the BB that he started her on. Thanks! [name]

## 2018-07-22 ENCOUNTER — Ambulatory Visit: Payer: Medicare Other | Admitting: Internal Medicine

## 2018-07-22 ENCOUNTER — Encounter: Payer: Self-pay | Admitting: Internal Medicine

## 2018-07-22 VITALS — BP 148/82 | HR 78 | Ht 65.0 in | Wt 187.8 lb

## 2018-07-22 DIAGNOSIS — I2510 Atherosclerotic heart disease of native coronary artery without angina pectoris: Secondary | ICD-10-CM | POA: Diagnosis not present

## 2018-07-22 DIAGNOSIS — I5031 Acute diastolic (congestive) heart failure: Secondary | ICD-10-CM | POA: Diagnosis not present

## 2018-07-22 DIAGNOSIS — E7849 Other hyperlipidemia: Secondary | ICD-10-CM

## 2018-07-22 MED ORDER — CARVEDILOL 3.125 MG PO TABS: 3.1250 mg | ORAL_TABLET | Freq: Two times a day (BID) | ORAL | 3 refills | Status: DC

## 2018-07-22 NOTE — Patient Instructions (Signed)
Medication Instructions:   START carvedilol 3.125mg  twice daily CONTINUE other current medications  Labwork:  FASTING lab work in 3 months to recheck cholesterol  Testing/Procedures:  NONE  Follow-Up:  Your physician recommends that you schedule a follow-up appointment in [place] with Dr. Debara Pickett (after lab work)   If you need a refill on your cardiac medications before your next appointment, please call your pharmacy.  Any Other Special Instructions Will Be Listed Below (If Applicable).

## 2018-07-22 NOTE — Progress Notes (Signed)
OFFICE NOTE  Chief Complaint:  Hospital follow-up  Primary Care Physician: Andrea Grandchild, NP  HPI:  Andrea Brooks is a 82 y.o. female with a past medial history significant for anxiety, type 2 diabetes, GERD, hypertension and dyslipidemia, who recently was admitted for persistent diarrhea, found to have an E. coli infection.  Subsequently after significant hydration she developed what was suspected to be acute diastolic congestive heart failure.  This required diuresis and she was seen by Dr. Radford Brooks and Dr. Oval Brooks.  She was noted to have elevated troponin was referred for heart catheterization which showed mild nonobstructive coronary disease.  As her husband is a patient of mine, she requested to follow-up with me.  She reports her diarrhea has improved significantly.  She saw Dr. Loletha Brooks, who is been working with her to treat that.  While hospitalized they recommended starting appropriate heart failure treatment.  An echo was performed which showed normal systolic function and mild diastolic dysfunction.  It was recommended she go on to low-dose metoprolol and that her Vytorin's be switched over to atorvastatin for better LDL reduction.  She was leery of making those changes and wanted to discuss it with me further.  She did see her primary care provider in follow-up in a repeat lipid profile was performed.  This was 2 weeks ago and demonstrated total cholesterol of 85, triglycerides 128, HDL 24 and LDL 35.  At this point, it is not likely that she needs to switch cholesterol medications, and the low numbers are most likely related to her diarrhea over the past several weeks.  I suspect they are in fact falsely lowered because of this.  Overall though she feels better, denying any worsening shortness of breath or chest pain.  Pressure is mildly elevated 148/82 today.  She says is been running in the 140s at home.  PMHx:  Past Medical History:  Diagnosis Date  . Anxiety   . Colon polyps   .  Diabetes mellitus without complication ([place])   . Diverticulosis   . Gallstones   . GERD (gastroesophageal reflux disease)   . Hyperlipidemia   . Hypertension   . Obesity     Past Surgical History:  Procedure Laterality Date  . ABDOMINAL HYSTERECTOMY     total  . BREAST BIOPSY Left    neg  . CHOLECYSTECTOMY    . JOINT REPLACEMENT Bilateral    knee  . LEFT HEART CATH AND CORONARY ANGIOGRAPHY N/A 07/04/2018   Procedure: LEFT HEART CATH AND CORONARY ANGIOGRAPHY;  Surgeon: Andrea Booze, MD;  Location: [facility];  Service: Cardiovascular;  Laterality: N/A;  . REPLACEMENT TOTAL KNEE BILATERAL      FAMHx:  Family History  Problem Relation Age of Onset  . Hypertension Mother   . Colon cancer Mother   . Stroke Father   . Heart failure Father   . Stroke Sister   . Stroke Brother   . Graves' disease Daughter   . Breast cancer Paternal Aunt   . Graves' disease Sister   . Bone cancer Sister   . Breast cancer Maternal Aunt   . Esophageal cancer Neg Hx   . Rectal cancer Neg Hx   . Liver cancer Neg Hx     SOCHx:   reports that she quit smoking about 58 years ago. She has a 2.00 pack-year smoking history. She has never used smokeless tobacco. She reports that she does not drink alcohol or use drugs.  ALLERGIES:  No  Known Allergies  ROS: Pertinent items noted in HPI and remainder of comprehensive ROS otherwise negative.  HOME MEDS: Current Outpatient Medications on File Prior to Visit  Medication Sig Dispense Refill  . acetaminophen (TYLENOL) 500 MG tablet Take 1,000 mg by mouth every 6 (six) hours as needed for moderate pain.    Marland Kitchen aspirin EC 81 MG tablet Take 81 mg by mouth daily.    Marland Kitchen dicyclomine (BENTYL) 10 MG capsule Take 1 capsule (10 mg total) by mouth 3 (three) times daily as needed for spasms. 60 capsule 0  . ezetimibe-simvastatin (VYTORIN) 10-20 MG tablet Take 1 tablet by mouth daily.    Marland Kitchen LORazepam (ATIVAN) 1 MG tablet Take 1 tablet (1 mg total) by  mouth daily as needed for anxiety. 30 tablet 0  . metFORMIN (GLUCOPHAGE) 500 MG tablet Take 1 tablet (500 mg total) by mouth 2 (two) times daily with a meal. Start taking 07/07/2018 180 tablet 0  . omeprazole (PRILOSEC OTC) 20 MG tablet Take 20 mg by mouth daily.    . valsartan (DIOVAN) 160 MG tablet Take 160 mg by mouth daily.     No current facility-administered medications on file prior to visit.     LABS/IMAGING: No results found for this or any previous visit (from the past 48 hour(s)). No results found.  LIPID PANEL:    Component Value Date/Time   CHOL 85 07/04/2018 0526   CHOL 156 11/04/2017 1131   TRIG 128 07/04/2018 0526   HDL 24 (L) 07/04/2018 0526   HDL 44 11/04/2017 1131   CHOLHDL 3.5 07/04/2018 0526   VLDL 26 07/04/2018 0526   LDLCALC 35 07/04/2018 0526   LDLCALC 94 11/04/2017 1131     WEIGHTS: Wt Readings from Last 3 Encounters:  07/22/18 187 lb 12.8 oz (85.2 kg)  07/13/18 186 lb (84.4 kg)  07/12/18 187 lb 4.8 oz (85 kg)    VITALS: BP (!) 148/82   Pulse 78   Ht 5\' 5"  (1.651 m)   Wt 187 lb 12.8 oz (85.2 kg)   SpO2 96%   BMI 31.25 kg/m   EXAM: General appearance: alert and no distress Neck: no carotid bruit, no JVD and thyroid not enlarged, symmetric, no tenderness/mass/nodules Lungs: clear to auscultation bilaterally Heart: regular rate and rhythm, S1, S2 normal, no murmur, click, rub or gallop Abdomen: soft, non-tender; bowel sounds normal; no masses,  no organomegaly Extremities: extremities normal, atraumatic, no cyanosis or edema Pulses: 2+ and symmetric Skin: Skin color, texture, turgor normal. No rashes or lesions Neurologic: Grossly normal Psych: Pleasant  EKG: Deferred  ASSESSMENT: 1. Acute diastolic congestive heart failure (EF 60 to 65%, 06/2018) 2. Dyslipidemia 3. Pretension 4. Mild nonobstructive coronary disease (cath 06/2018)  PLAN: 1.   Mrs. Andrea Brooks had what sounds like acute diastolic congestive heart failure after volume  overload which was iatrogenic treatment for E. coli diarrhea.  Fortunately that resolved, but suggest that she has an underlying stiff heart.  She was found to have mild nonobstructive coronary disease by cath but needs aggressive medical therapy.  Fortunately with her recent diarrhea on top of her current combination statin/Zetia, she had significant reduction in her lipid profile.  I do not see indication to switch her to atorvastatin.  I do agree with recommendation for beta-blocker, but would recommend carvedilol 3.125 mg twice daily for better blood pressure control.  This should also improve filling time and decrease her tendency for diastolic congestive heart failure.  Follow-up in 6 months.  Chrissie Noa  C. Debara Pickett, MD, [facility], [place] Director of the [facility]  [facility] Diplomate of the American Board of Clinical Lipidology Attending Cardiologist  Direct Dial: [phone]  Fax: [phone]  Website:  [email or web address] 07/22/2018, 4:58 PM

## 2018-07-31 ENCOUNTER — Other Ambulatory Visit: Payer: Self-pay | Admitting: Adult Health

## 2018-08-19 ENCOUNTER — Telehealth: Payer: Self-pay | Admitting: Adult Health

## 2018-08-19 NOTE — Telephone Encounter (Signed)
Patient called states she called [facility] for  Rx refill on: metFORMIN (GLUCOPHAGE) 500 MG tablet [[id number]]   Order Details  Dose: 500 mg Route: Oral Frequency: 2 times daily with meals  Dispense Quantity: 180 tablet Refills: 0 Fills remaining: --        Sig: Take 1 tablet (500 mg total) by mouth 2 (two) times daily with a meal. Start taking 07/07/2018          --- ( But was told needed to speak with her provider--- See on 07/05/18 medication [facility] to [facility] to [facility], pt says only has 5 days supply left (take 2 pills daily).   --Patient states should be sent to:  Preferred Pharmacies      [place] [place] [phone] (Phone) [phone] (Fax)   --forwarding request to medical assistant. --glh

## 2018-08-19 NOTE — Telephone Encounter (Signed)
Spoke with pt and advised RX was sent to [facility] on 07/05/18.  Pt states that she was unaware of RX, but she is concerned that the RX at [facility] may cost more than $10, which is her copay thru [facility].  Advised pt to contact [facility] for pricing.  Pt called back and states that a 90 day supply is $1.97 so she will obtain this RX from [facility].  Charyl Bigger, CMA

## 2018-09-08 ENCOUNTER — Encounter (HOSPITAL_COMMUNITY): Payer: Self-pay

## 2018-09-08 ENCOUNTER — Other Ambulatory Visit: Payer: Self-pay

## 2018-09-08 ENCOUNTER — Emergency Department (HOSPITAL_COMMUNITY)
Admission: EM | Admit: 2018-09-08 | Discharge: 2018-09-08 | Disposition: A | Discharge status: Home / Self Care | Payer: Medicare Other | Attending: Emergency Medicine | Admitting: Emergency Medicine

## 2018-09-08 DIAGNOSIS — Z7984 Long term (current) use of oral hypoglycemic drugs: Secondary | ICD-10-CM | POA: Insufficient documentation

## 2018-09-08 DIAGNOSIS — Z79899 Other long term (current) drug therapy: Secondary | ICD-10-CM | POA: Diagnosis not present

## 2018-09-08 DIAGNOSIS — Z87891 Personal history of nicotine dependence: Secondary | ICD-10-CM | POA: Insufficient documentation

## 2018-09-08 DIAGNOSIS — Z7982 Long term (current) use of aspirin: Secondary | ICD-10-CM | POA: Insufficient documentation

## 2018-09-08 DIAGNOSIS — I10 Essential (primary) hypertension: Secondary | ICD-10-CM | POA: Insufficient documentation

## 2018-09-08 DIAGNOSIS — Z96653 Presence of artificial knee joint, bilateral: Secondary | ICD-10-CM | POA: Insufficient documentation

## 2018-09-08 DIAGNOSIS — R112 Nausea with vomiting, unspecified: Secondary | ICD-10-CM

## 2018-09-08 DIAGNOSIS — E119 Type 2 diabetes mellitus without complications: Secondary | ICD-10-CM | POA: Insufficient documentation

## 2018-09-08 LAB — COMPREHENSIVE METABOLIC PANEL
ALK PHOS: 65 U/L (ref 38–126)
ALT: 17 U/L (ref 0–44)
ANION GAP: 13 (ref 5–15)
AST: 24 U/L (ref 15–41)
Albumin: 4.2 g/dL (ref 3.5–5.0)
BILIRUBIN TOTAL: 1 mg/dL (ref 0.3–1.2)
BUN: 16 mg/dL (ref 8–23)
CALCIUM: 9.8 mg/dL (ref 8.9–10.3)
CO2: 25 mmol/L (ref 22–32)
Chloride: 102 mmol/L (ref 98–111)
Creatinine, Ser: 0.93 mg/dL (ref 0.44–1.00)
GFR calc Af Amer: >60 mL/min (ref >60)
GFR, EST NON AFRICAN AMERICAN: 57 mL/min — AB (ref >60)
GLUCOSE: 174 mg/dL — AB (ref 70–99)
POTASSIUM: 3.7 mmol/L (ref 3.5–5.1)
Sodium: 140 mmol/L (ref 135–145)
Total Protein: 8.5 g/dL — ABNORMAL HIGH (ref 6.5–8.1)

## 2018-09-08 LAB — CBC WITH DIFFERENTIAL/PLATELET
Abs Immature Granulocytes: 0.05 10*3/uL (ref 0.00–0.07)
BASOS PCT: 0 %
Basophils Absolute: 0 10*3/uL (ref 0.0–0.1)
Eosinophils Absolute: 0 10*3/uL (ref 0.0–0.5)
Eosinophils Relative: 0 %
HCT: 44.2 % (ref 36.0–46.0)
Hemoglobin: 13.9 g/dL (ref 12.0–15.0)
Immature Granulocytes: 0 %
Lymphocytes Relative: 9 %
Lymphs Abs: 1 10*3/uL (ref 0.7–4.0)
MCH: 27.5 pg (ref 26.0–34.0)
MCHC: 31.4 g/dL (ref 30.0–36.0)
MCV: 87.4 fL (ref 80.0–100.0)
MONO ABS: 0.6 10*3/uL (ref 0.1–1.0)
MONOS PCT: 5 %
Neutro Abs: 9.9 10*3/uL — ABNORMAL HIGH (ref 1.7–7.7)
Neutrophils Relative %: 86 %
PLATELETS: 239 10*3/uL (ref 150–400)
RBC: 5.06 MIL/uL (ref 3.87–5.11)
RDW: 14 % (ref 11.5–15.5)
WBC: 11.7 10*3/uL — ABNORMAL HIGH (ref 4.0–10.5)
nRBC: 0 % (ref 0.0–0.2)

## 2018-09-08 LAB — URINALYSIS, ROUTINE W REFLEX MICROSCOPIC
Bacteria, UA: NONE SEEN
GLUCOSE, UA: NEGATIVE mg/dL
Hgb urine dipstick: NEGATIVE
Ketones, ur: 5 mg/dL — AB
Nitrite: NEGATIVE
Specific Gravity, Urine: 1.034 — ABNORMAL HIGH (ref 1.005–1.030)
pH: 5 (ref 5.0–8.0)

## 2018-09-08 LAB — LIPASE, BLOOD: Lipase: 33 U/L (ref 11–51)

## 2018-09-08 MED ORDER — METOCLOPRAMIDE HCL 10 MG PO TABS: 10.0000 mg | ORAL_TABLET | Freq: Four times a day (QID) | ORAL | 0 refills | Status: DC | PRN

## 2018-09-08 MED ORDER — METOCLOPRAMIDE HCL 5 MG/ML IJ SOLN: 5.0000 mg | Freq: Once | INTRAMUSCULAR | Status: DC | PRN

## 2018-09-08 MED ORDER — SODIUM CHLORIDE 0.9 % IV BOLUS
1000.0000 mL | Freq: Once | INTRAVENOUS | Status: AC
  Administered 2018-09-08: 1000 mL via INTRAVENOUS

## 2018-09-08 MED ORDER — ONDANSETRON HCL 4 MG/2ML IJ SOLN
4.0000 mg | Freq: Once | INTRAMUSCULAR | Status: AC
  Administered 2018-09-08: 4 mg via INTRAVENOUS
  Filled 2018-09-08: qty 2

## 2018-09-08 MED ORDER — ONDANSETRON 8 MG PO TBDP: 8.0000 mg | ORAL_TABLET | Freq: Three times a day (TID) | ORAL | 0 refills | Status: DC | PRN

## 2018-09-08 NOTE — ED Provider Notes (Signed)
[facility] DEPT Provider Note: Georgena Spurling, MD, FACEP  CSN: [id number] MRN: [id number] ARRIVAL: 09/08/18 at [place]: [place]  Vomiting   HISTORY OF PRESENT ILLNESS  09/08/18 3:41 AM Andrea Brooks is a 82 y.o. female with about a 24-hour history of nausea and vomiting.  The vomiting has been profuse and is described as bilious.  It has also contains some undigested food.  It has not contain blood.  She has not had associated fever, chills, abdominal pain, abdominal cramping or diarrhea.  She denies chest pain or shortness of breath.  She does have generalized weakness.  Beginning about 930PM yesterday evening she developed hiccups which caused epigastric discomfort but the hiccups have subsequently resolved.    Past Medical History:  Diagnosis Date  . Anxiety   . Colon polyps   . Diabetes mellitus without complication ([place])   . Diverticulosis   . Gallstones   . GERD (gastroesophageal reflux disease)   . Hyperlipidemia   . Hypertension   . Obesity     Past Surgical History:  Procedure Laterality Date  . ABDOMINAL HYSTERECTOMY     total  . BREAST BIOPSY Left    neg  . CHOLECYSTECTOMY    . JOINT REPLACEMENT Bilateral    knee  . LEFT HEART CATH AND CORONARY ANGIOGRAPHY N/A 07/04/2018   Procedure: LEFT HEART CATH AND CORONARY ANGIOGRAPHY;  Surgeon: Jettie Booze, MD;  Location: [facility];  Service: Cardiovascular;  Laterality: N/A;  . REPLACEMENT TOTAL KNEE BILATERAL      Family History  Problem Relation Age of Onset  . Hypertension Mother   . Colon cancer Mother   . Stroke Father   . Heart failure Father   . Stroke Sister   . Stroke Brother   . Graves' disease Daughter   . Breast cancer Paternal Aunt   . Graves' disease Sister   . Bone cancer Sister   . Breast cancer Maternal Aunt   . Esophageal cancer Neg Hx   . Rectal cancer Neg Hx   . Liver cancer Neg Hx     Social History   Tobacco Use  . Smoking status:  Former Smoker    Packs/day: 1.00    Years: 2.00    Pack years: 2.00    Last attempt to quit: 11/24/1959    Years since quitting: 58.8  . Smokeless tobacco: Never Used  Substance Use Topics  . Alcohol use: No  . Drug use: No    Prior to Admission medications   Medication Sig Start Date End Date Taking? Authorizing Provider  aspirin EC 81 MG tablet Take 81 mg by mouth daily.   Yes [provider]  carvedilol (COREG) 3.125 MG tablet Take 1 tablet (3.125 mg total) by mouth 2 (two) times daily. 07/22/18 10/20/18 Yes Hilty, Nadean Corwin, MD  ezetimibe-simvastatin (VYTORIN) 10-20 MG tablet Take 1 tablet by mouth daily.   Yes [provider]  LORazepam (ATIVAN) 1 MG tablet Take 1 tablet (1 mg total) by mouth daily as needed for anxiety. 06/15/18  Yes Danford, [name] D, NP  metFORMIN (GLUCOPHAGE) 500 MG tablet Take 1 tablet (500 mg total) by mouth 2 (two) times daily with a meal. Start taking 07/07/2018 07/05/18  Yes Georgette Shell, MD  omeprazole (PRILOSEC OTC) 20 MG tablet Take 20 mg by mouth daily.   Yes [provider]  valsartan (DIOVAN) 160 MG tablet Take 160 mg by mouth daily.   Yes [provider]  acetaminophen (TYLENOL) 500 MG tablet Take 1,000 mg by mouth every 6 (six) hours as needed for moderate pain.    [provider]  dicyclomine (BENTYL) 10 MG capsule Take 1 capsule (10 mg total) by mouth 3 (three) times daily as needed for spasms. 07/02/18 08/01/18  Alma Friendly, MD    Allergies Patient has no known allergies.   REVIEW OF SYSTEMS  Negative except as noted here or in the History of Present Illness.   PHYSICAL EXAMINATION  Initial Vital Signs Blood pressure (!) 159/93, pulse 94, temperature 98 F (36.7 C), temperature source Oral, resp. rate 16, SpO2 97 %.  Examination General: Well-developed, well-nourished female in no acute distress; appearance consistent with age of record HENT: normocephalic; atraumatic Eyes: pupils  equal, round and reactive to light; extraocular muscles intact Neck: supple Heart: regular rate and rhythm Lungs: clear to auscultation bilaterally Abdomen: soft; nondistended; nontender; no masses or hepatosplenomegaly; bowel sounds present Extremities: No deformity; full range of motion; pulses normal Neurologic: Awake, alert and oriented; motor function intact in all extremities and symmetric; no facial droop Skin: Warm and dry Psychiatric: Normal mood and affect   RESULTS  Summary of this visit's results, reviewed by myself:   EKG Interpretation  Date/Time:    Ventricular Rate:    PR Interval:    QRS Duration:   QT Interval:    QTC Calculation:   R Axis:     Text Interpretation:        Laboratory Studies: Results for orders placed or performed during the hospital encounter of 09/08/18 (from the past 24 hour(s))  Urinalysis, Routine w reflex microscopic     Status: Abnormal   Collection Time: 09/08/18  4:01 AM  Result Value Ref Range   Color, Urine AMBER (A) YELLOW   APPearance HAZY (A) CLEAR   Specific Gravity, Urine 1.034 (H) 1.005 - 1.030   pH 5.0 5.0 - 8.0   Glucose, UA NEGATIVE NEGATIVE mg/dL   Hgb urine dipstick NEGATIVE NEGATIVE   Bilirubin Urine SMALL (A) NEGATIVE   Ketones, ur 5 (A) NEGATIVE mg/dL   Protein, ur >=300 (A) NEGATIVE mg/dL   Nitrite NEGATIVE NEGATIVE   Leukocytes, UA TRACE (A) NEGATIVE   RBC / HPF 0-5 0 - 5 RBC/hpf   WBC, UA 11-20 0 - 5 WBC/hpf   Bacteria, UA NONE SEEN NONE SEEN   Squamous Epithelial / LPF 0-5 0 - 5   Mucus PRESENT    Hyaline Casts, UA PRESENT    Non Squamous Epithelial 0-5 (A) NONE SEEN  Lipase, blood     Status: None   Collection Time: 09/08/18  4:12 AM  Result Value Ref Range   Lipase 33 11 - 51 U/L  Comprehensive metabolic panel     Status: Abnormal   Collection Time: 09/08/18  4:12 AM  Result Value Ref Range   Sodium 140 135 - 145 mmol/L   Potassium 3.7 3.5 - 5.1 mmol/L   Chloride 102 98 - 111 mmol/L   CO2 25  22 - 32 mmol/L   Glucose, Bld 174 (H) 70 - 99 mg/dL   BUN 16 8 - 23 mg/dL   Creatinine, Ser 0.93 0.44 - 1.00 mg/dL   Calcium 9.8 8.9 - 10.3 mg/dL   Total Protein 8.5 (H) 6.5 - 8.1 g/dL   Albumin 4.2 3.5 - 5.0 g/dL   AST 24 15 - 41 U/L   ALT 17 0 - 44 U/L   Alkaline Phosphatase 65  38 - 126 U/L   Total Bilirubin 1.0 0.3 - 1.2 mg/dL   GFR calc non Af Amer 57 (L) >60 mL/min   GFR calc Af Amer >60 >60 mL/min   Anion gap 13 5 - 15  CBC with Differential/Platelet     Status: Abnormal   Collection Time: 09/08/18  4:12 AM  Result Value Ref Range   WBC 11.7 (H) 4.0 - 10.5 K/uL   RBC 5.06 3.87 - 5.11 MIL/uL   Hemoglobin 13.9 12.0 - 15.0 g/dL   HCT 44.2 36.0 - 46.0 %   MCV 87.4 80.0 - 100.0 fL   MCH 27.5 26.0 - 34.0 pg   MCHC 31.4 30.0 - 36.0 g/dL   RDW 14.0 11.5 - 15.5 %   Platelets 239 150 - 400 K/uL   nRBC 0.0 0.0 - 0.2 %   Neutrophils Relative % 86 %   Neutro Abs 9.9 (H) 1.7 - 7.7 K/uL   Lymphocytes Relative 9 %   Lymphs Abs 1.0 0.7 - 4.0 K/uL   Monocytes Relative 5 %   Monocytes Absolute 0.6 0.1 - 1.0 K/uL   Eosinophils Relative 0 %   Eosinophils Absolute 0.0 0.0 - 0.5 K/uL   Basophils Relative 0 %   Basophils Absolute 0.0 0.0 - 0.1 K/uL   Immature Granulocytes 0 %   Abs Immature Granulocytes 0.05 0.00 - 0.07 K/uL   Imaging Studies: No results found.  ED COURSE and MDM  Nursing notes and initial vitals signs, including pulse oximetry, reviewed.  Vitals:   09/08/18 0333 09/08/18 0430 09/08/18 0500 09/08/18 0530  BP: (!) 159/93 130/66 (!) 144/69 (!) 152/66  Pulse: 94 85 68 73  Resp: 16   14  Temp: 98 F (36.7 C)     TempSrc: Oral     SpO2: 97% 93% 92% 93%   5:52 AM Patient has not vomited since receiving IV fluid bolus and Zofran.  She does not currently have hiccups.  She is drinking fluids without emesis.  PROCEDURES    ED DIAGNOSES     ICD-10-CM   1. Nausea and vomiting in adult R11.2        Dainel Arcidiacono, Jenny Reichmann, MD 09/08/18 [phone]

## 2018-09-08 NOTE — ED Triage Notes (Signed)
Pt reports that she has been vomiting for the last 24 hours, but worsened with breakfast this morning. She endorses an e. coli infection in August. She also endorses hiccups that were causing epigastric pain, but they have subsided. A&Ox4. Ambulatory.

## 2018-09-08 NOTE — ED Notes (Addendum)
Patient able to transfer from bed to wheelchair to go to BR with no assistance; tolerated well. Per family, patient needing to use wheelchair.

## 2018-09-14 ENCOUNTER — Ambulatory Visit (INDEPENDENT_AMBULATORY_CARE_PROVIDER_SITE_OTHER): Payer: Medicare Other | Admitting: Adult Health

## 2018-09-14 ENCOUNTER — Encounter: Payer: Self-pay | Admitting: Adult Health

## 2018-09-14 VITALS — BP 139/85 | HR 67 | Ht 65.0 in | Wt 186.0 lb

## 2018-09-14 DIAGNOSIS — Z780 Asymptomatic menopausal state: Secondary | ICD-10-CM | POA: Diagnosis not present

## 2018-09-14 DIAGNOSIS — I10 Essential (primary) hypertension: Secondary | ICD-10-CM | POA: Diagnosis not present

## 2018-09-14 DIAGNOSIS — E119 Type 2 diabetes mellitus without complications: Secondary | ICD-10-CM

## 2018-09-14 DIAGNOSIS — E7849 Other hyperlipidemia: Secondary | ICD-10-CM

## 2018-09-14 DIAGNOSIS — F419 Anxiety disorder, unspecified: Secondary | ICD-10-CM

## 2018-09-14 DIAGNOSIS — E2839 Other primary ovarian failure: Secondary | ICD-10-CM | POA: Diagnosis not present

## 2018-09-14 MED ORDER — EZETIMIBE-SIMVASTATIN 10-20 MG PO TABS: 1.0000 | ORAL_TABLET | Freq: Every day | ORAL | 0 refills | Status: DC

## 2018-09-14 MED ORDER — LORAZEPAM 1 MG PO TABS: 1.0000 mg | ORAL_TABLET | Freq: Every day | ORAL | 0 refills | Status: DC | PRN

## 2018-09-14 NOTE — Patient Instructions (Addendum)
Diabetes Mellitus and Nutrition When you have diabetes (diabetes mellitus), it is very important to have healthy eating habits because your blood sugar (glucose) levels are greatly affected by what you eat and drink. Eating healthy foods in the appropriate amounts, at about the same times every day, can help you:  Control your blood glucose.  Lower your risk of heart disease.  Improve your blood pressure.  Reach or maintain a healthy weight.  Every person with diabetes is different, and each person has different needs for a meal plan. Your health care provider may recommend that you work with a diet and nutrition specialist (dietitian) to make a meal plan that is best for you. Your meal plan may vary depending on factors such as:  The calories you need.  The medicines you take.  Your weight.  Your blood glucose, blood pressure, and cholesterol levels.  Your activity level.  Other health conditions you have, such as heart or kidney disease.  How do carbohydrates affect me? Carbohydrates affect your blood glucose level more than any other type of food. Eating carbohydrates naturally increases the amount of glucose in your blood. Carbohydrate counting is a method for keeping track of how many carbohydrates you eat. Counting carbohydrates is important to keep your blood glucose at a healthy level, especially if you use insulin or take certain oral diabetes medicines. It is important to know how many carbohydrates you can safely have in each meal. This is different for every person. Your dietitian can help you calculate how many carbohydrates you should have at each meal and for snack. Foods that contain carbohydrates include:  Bread, cereal, rice, pasta, and crackers.  Potatoes and corn.  Peas, beans, and lentils.  Milk and yogurt.  Fruit and juice.  Desserts, such as cakes, cookies, ice cream, and candy.  How does alcohol affect me? Alcohol can cause a sudden decrease in blood  glucose (hypoglycemia), especially if you use insulin or take certain oral diabetes medicines. Hypoglycemia can be a life-threatening condition. Symptoms of hypoglycemia (sleepiness, dizziness, and confusion) are similar to symptoms of having too much alcohol. If your health care provider says that alcohol is safe for you, follow these guidelines:  Limit alcohol intake to no more than 1 drink per day for nonpregnant women and 2 drinks per day for men. One drink equals 12 oz of beer, 5 oz of wine, or 1 oz of hard liquor.  Do not drink on an empty stomach.  Keep yourself hydrated with water, diet soda, or unsweetened iced tea.  Keep in mind that regular soda, juice, and other mixers may contain a lot of sugar and must be counted as carbohydrates.  What are tips for following this plan? Reading food labels  Start by checking the serving size on the label. The amount of calories, carbohydrates, fats, and other nutrients listed on the label are based on one serving of the food. Many foods contain more than one serving per package.  Check the total grams (g) of carbohydrates in one serving. You can calculate the number of servings of carbohydrates in one serving by dividing the total carbohydrates by 15. For example, if a food has 30 g of total carbohydrates, it would be equal to 2 servings of carbohydrates.  Check the number of grams (g) of saturated and trans fats in one serving. Choose foods that have low or no amount of these fats.  Check the number of milligrams (mg) of sodium in one serving. Most people   should limit total sodium intake to less than 2,300 mg per day.  Always check the nutrition information of foods labeled as "low-fat" or "nonfat". These foods may be higher in added sugar or refined carbohydrates and should be avoided.  Talk to your dietitian to identify your daily goals for nutrients listed on the label. Shopping  Avoid buying canned, premade, or processed foods. These  foods tend to be high in fat, sodium, and added sugar.  Shop around the outside edge of the grocery store. This includes fresh fruits and vegetables, bulk grains, fresh meats, and fresh dairy. Cooking  Use low-heat cooking methods, such as baking, instead of high-heat cooking methods like deep frying.  Cook using healthy oils, such as olive, canola, or sunflower oil.  Avoid cooking with butter, cream, or high-fat meats. Meal planning  Eat meals and snacks regularly, preferably at the same times every day. Avoid going long periods of time without eating.  Eat foods high in fiber, such as fresh fruits, vegetables, beans, and whole grains. Talk to your dietitian about how many servings of carbohydrates you can eat at each meal.  Eat 4-6 ounces of lean protein each day, such as lean meat, chicken, fish, eggs, or tofu. 1 ounce is equal to 1 ounce of meat, chicken, or fish, 1 egg, or 1/4 cup of tofu.  Eat some foods each day that contain healthy fats, such as avocado, nuts, seeds, and fish. Lifestyle   Check your blood glucose regularly.  Exercise at least 30 minutes 5 or more days each week, or as told by your health care provider.  Take medicines as told by your health care provider.  Do not use any products that contain nicotine or tobacco, such as cigarettes and e-cigarettes. If you need help quitting, ask your health care provider.  Work with a Social worker or diabetes educator to identify strategies to manage stress and any emotional and social challenges. What are some questions to ask my health care provider?  Do I need to meet with a diabetes educator?  Do I need to meet with a dietitian?  What number can I call if I have questions?  When are the best times to check my blood glucose? Where to find more information:  American Diabetes Association: diabetes.org/food-and-fitness/food  Academy of Nutrition and Dietetics:  PokerClues.dk  [employer] of Diabetes and Digestive and Kidney Diseases (NIH): ContactWire.be Summary  A healthy meal plan will help you control your blood glucose and maintain a healthy lifestyle.  Working with a diet and nutrition specialist (dietitian) can help you make a meal plan that is best for you.  Keep in mind that carbohydrates and alcohol have immediate effects on your blood glucose levels. It is important to count carbohydrates and to use alcohol carefully. This information is not intended to replace advice given to you by your health care provider. Make sure you discuss any questions you have with your health care provider. Document Released: 08/06/2005 Document Revised: 12/14/2016 Document Reviewed: 12/14/2016 Elsevier Interactive Patient Education  2018 [employer].  1) Continue all medications as directed. Lorazepam refilled. 2) DEXA ordered.  Please start Women's Once a day multi-vitamin, please ask local pharmacist for their recommendation. 3) Please schedule morning follow-up appt in 3 weeks, we will re-check cholesterol and A1c at that time. NICE TO SEE YOU!

## 2018-09-14 NOTE — Assessment & Plan Note (Signed)
Lab Results  Component Value Date   HGBA1C 6.3 (H) 06/28/2018   HGBA1C 6.9 (A) 06/15/2018   HGBA1C 5.9 03/17/2018   Currently on Metformin 500mg  BID Denies episodes of hypogylcemia Follows diabetic diet

## 2018-09-14 NOTE — Assessment & Plan Note (Signed)
BP at goal 139/85, HR 67 Continue Coreg 3.125 BID  Followed by cards Q6M

## 2018-09-14 NOTE — Progress Notes (Signed)
Subjective:    Patient ID: Andrea Brooks, female    DOB: 1936/07/24, 82 y.o.   MRN: [id number]  HPI: [name] is here with several issues to discuss: 1) Reports "feelong more stooped over".  She reports never having had DEXA scan. She is not currently taking Vit d or Calcium Supplment 2) She reports home BP readings: SBP 120-140 DBP 70-80 Denies CP/increased dyspnea/palpitations 3) Bout of N/V/hiccups last week that required ED visit, treated with Ondansetron and Metoclopramide. She denies any GI sx's currently, just lingering fatigue She is non-fasting today, therefore cannot obtain lipid panel today Of note- Cards OV 07/22/18: Started on carvedilol 3.125 mg twice daily for better blood pressure f/u in 6 months  Patient Care Team    Relationship Specialty Notifications Start End  Esaw Grandchild, NP PCP - General Family Medicine  04/22/17   Sueanne Margarita, MD PCP - Cardiology Cardiology Admissions 07/04/18     Patient Active Problem List   Diagnosis Date Noted  . Hospital discharge follow-up 07/12/2018  . Acute congestive heart failure ([place])   . Non-ST elevation (NSTEMI) myocardial infarction ([place])   . Acute diastolic (congestive) heart failure ([place]) 07/02/2018  . Elevated troponin 07/02/2018  . AKI (acute kidney injury) ([place]) 06/28/2018  . Diabetes mellitus without complication ([place]) 34/19/6222  . Diverticulitis-  mild symptoms 03/30/2018  . Irritable bowel syndrome with both constipation and diarrhea 03/30/2018  . Pain of right heel 07/06/2017  . Loose stools 06/24/2017  . Right-sided thoracic back pain 06/24/2017  . Hyperlipidemia 04/22/2017  . GERD (gastroesophageal reflux disease) 04/22/2017  . Impaired glucose metabolism 04/22/2017  . Essential hypertension 04/22/2017  . Anxiety 04/22/2017     Past Medical History:  Diagnosis Date  . Anxiety   . Colon polyps   . Diabetes mellitus without complication ([place])   . Diverticulosis   . Food poisoning   .  Gallstones   . GERD (gastroesophageal reflux disease)   . Hyperlipidemia   . Hypertension   . Obesity      Past Surgical History:  Procedure Laterality Date  . ABDOMINAL HYSTERECTOMY     total  . BREAST BIOPSY Left    neg  . CHOLECYSTECTOMY    . JOINT REPLACEMENT Bilateral    knee  . LEFT HEART CATH AND CORONARY ANGIOGRAPHY N/A 07/04/2018   Procedure: LEFT HEART CATH AND CORONARY ANGIOGRAPHY;  Surgeon: Jettie Booze, MD;  Location: [facility];  Service: Cardiovascular;  Laterality: N/A;  . REPLACEMENT TOTAL KNEE BILATERAL       Family History  Problem Relation Age of Onset  . Hypertension Mother   . Colon cancer Mother   . Stroke Father   . Heart failure Father   . Stroke Sister   . Stroke Brother   . Graves' disease Daughter   . Breast cancer Paternal Aunt   . Graves' disease Sister   . Bone cancer Sister   . Breast cancer Maternal Aunt   . Esophageal cancer Neg Hx   . Rectal cancer Neg Hx   . Liver cancer Neg Hx      Social History   Substance and Sexual Activity  Drug Use No     Social History   Substance and Sexual Activity  Alcohol Use No     Social History   Tobacco Use  Smoking Status Former Smoker  . Packs/day: 1.00  . Years: 2.00  . Pack years: 2.00  . Last attempt to quit: 11/24/1959  .  Years since quitting: 58.8  Smokeless Tobacco Never Used     Outpatient Encounter Medications as of 09/14/2018  Medication Sig  . acetaminophen (TYLENOL) 500 MG tablet Take 1,000 mg by mouth every 6 (six) hours as needed for moderate pain.  Marland Kitchen aspirin EC 81 MG tablet Take 81 mg by mouth daily.  . carvedilol (COREG) 3.125 MG tablet Take 1 tablet (3.125 mg total) by mouth 2 (two) times daily.  Marland Kitchen ezetimibe-simvastatin (VYTORIN) 10-20 MG tablet Take 1 tablet by mouth daily.  Marland Kitchen LORazepam (ATIVAN) 1 MG tablet Take 1 tablet (1 mg total) by mouth daily as needed for anxiety.  . metFORMIN (GLUCOPHAGE) 500 MG tablet Take 1 tablet (500 mg total) by  mouth 2 (two) times daily with a meal. Start taking 07/07/2018  . omeprazole (PRILOSEC OTC) 20 MG tablet Take 20 mg by mouth daily.  . valsartan (DIOVAN) 160 MG tablet Take 160 mg by mouth daily.  Marland Kitchen dicyclomine (BENTYL) 10 MG capsule Take 1 capsule (10 mg total) by mouth 3 (three) times daily as needed for spasms.  . [DISCONTINUED] metoCLOPramide (REGLAN) 10 MG tablet Take 1 tablet (10 mg total) by mouth every 6 (six) hours as needed (Hiccups).  . [DISCONTINUED] ondansetron (ZOFRAN ODT) 8 MG disintegrating tablet Take 1 tablet (8 mg total) by mouth every 8 (eight) hours as needed for nausea or vomiting.   No facility-administered encounter medications on file as of 09/14/2018.     Allergies: Patient has no known allergies.  Body mass index is 30.95 kg/m.  Blood pressure 139/85, pulse 67, height 5\' 5"  (1.651 m), weight 186 lb (84.4 kg), SpO2 100 %.  Review of Systems  Constitutional: Positive for fatigue. Negative for activity change, appetite change, chills, diaphoresis, fever and unexpected weight change.  Respiratory: Negative for cough, chest tightness, shortness of breath, wheezing and stridor.   Cardiovascular: Negative for chest pain, palpitations and leg swelling.  Gastrointestinal: Negative for abdominal distention, abdominal pain, blood in stool, constipation, diarrhea, nausea and vomiting.  Genitourinary: Negative for difficulty urinating and flank pain.  Skin: Negative for color change, pallor, rash and wound.  Neurological: Negative for dizziness and headaches.  Psychiatric/Behavioral: Positive for sleep disturbance. Negative for behavioral problems, confusion, decreased concentration, dysphoric mood, hallucinations, self-injury and suicidal ideas. The patient is nervous/anxious. The patient is not hyperactive.        Objective:   Physical Exam  Constitutional: She is oriented to person, place, and time. She appears well-developed and well-nourished. No distress.  HENT:   Head: Normocephalic and atraumatic.  Right Ear: External ear normal.  Left Ear: External ear normal.  Nose: Nose normal.  Mouth/Throat: Oropharynx is clear and moist.  Cardiovascular: Normal rate, regular rhythm, normal heart sounds and intact distal pulses.  No murmur heard. Pulmonary/Chest: Effort normal and breath sounds normal. No stridor. No respiratory distress. She has no wheezes. She has no rales. She exhibits no tenderness.  Neurological: She is alert and oriented to person, place, and time.  Skin: Skin is warm and dry. Capillary refill takes less than 2 seconds. No rash noted. She is not diaphoretic. No erythema. No pallor.  Psychiatric: She has a normal mood and affect. Her behavior is normal. Judgment and thought content normal.  Vitals reviewed.     Assessment & Plan:   1. Postmenopausal   2. Estrogen deficiency   3. Essential hypertension   4. Diabetes mellitus without complication ([place])   5. Anxiety   6. Other hyperlipidemia  Essential hypertension BP at goal 139/85, HR 67 Continue Coreg 3.125 BID  Followed by cards Q6M  Diabetes mellitus without complication (HCC) Lab Results  Component Value Date   HGBA1C 6.3 (H) 06/28/2018   HGBA1C 6.9 (A) 06/15/2018   HGBA1C 5.9 03/17/2018   Currently on Metformin 500mg  BID Denies episodes of hypogylcemia Follows diabetic diet  [place] Controlled Substance Database reviewed- no aberrancies noted. Refill on Lorazepam provided She in on controlled substance contract  Hyperlipidemia Will check lipids at f/u in 3 weeks Currently on vytorin 10/20mg  QD    FOLLOW-UP:  Return in about 3 weeks (around 10/05/2018) for Regular Follow Up, HTN, Fasting Labs, A1c re-check.

## 2018-09-14 NOTE — Assessment & Plan Note (Signed)
[place] Controlled Substance Database reviewed- no aberrancies noted. Refill on Lorazepam provided She in on controlled substance contract

## 2018-09-14 NOTE — Assessment & Plan Note (Signed)
Will check lipids at f/u in 3 weeks Currently on vytorin 10/20mg  QD

## 2018-09-28 NOTE — Progress Notes (Signed)
Subjective:    Patient ID: Andrea Brooks, female    DOB: [birth year], 82 y.o.   MRN: [id number]  HPI:09/14/18 OV:  [name] is here with several issues to discuss: 1) Reports "feeling more stooped over".  She reports never having had DEXA scan. She is not currently taking Vit d or Calcium Supplment 2) She reports home BP readings: SBP 120-140 DBP 70-80 Denies CP/increased dyspnea/palpitations 3) Bout of N/V/hiccups last week that required ED visit, treated with Ondansetron and Metoclopramide. She denies any GI sx's currently, just lingering fatigue She is non-fasting today, therefore cannot obtain lipid panel today Of note- Cards OV 07/22/18: Started on carvedilol 3.125 mg twice daily for better blood pressure control f/u in 6 months  10/03/18 OV: [name] is here for f/u: HTN, T2D She reports AM BS 110-120s, denies episodes of hypoglycemia She has been increasing regular walking with dog and in her yard She has dramatically reduced CHO/sugar intake She denies CP/dyspnea/dizziness/HA/palpitations Recent OV with GI, she likely has IBS treated with diet modifications Foot Examination- normal Next cards OV 11/12/29/17  Lab Results  Component Value Date   HGBA1C 6.3 (H) 06/28/2018   HGBA1C 6.9 (A) 06/15/2018   HGBA1C 5.9 03/17/2018    Patient Care Team    Relationship Specialty Notifications Start End  Esaw Grandchild, NP PCP - General Family Medicine  04/22/17   Sueanne Margarita, MD PCP - Cardiology Cardiology Admissions 07/04/18     Patient Active Problem List   Diagnosis Date Noted  . Acute congestive heart failure ([place])   . Non-ST elevation (NSTEMI) myocardial infarction ([place])   . Acute diastolic (congestive) heart failure ([place]) 07/02/2018  . Elevated troponin 07/02/2018  . Diabetes mellitus without complication ([place]) 09/98/3382  . Diverticulitis-  mild symptoms 03/30/2018  . Irritable bowel syndrome with both constipation and diarrhea 03/30/2018  . Healthcare  maintenance 03/17/2018  . Pain of right heel 07/06/2017  . Loose stools 06/24/2017  . Right-sided thoracic back pain 06/24/2017  . Hyperlipidemia 04/22/2017  . GERD (gastroesophageal reflux disease) 04/22/2017  . Impaired glucose metabolism 04/22/2017  . Essential hypertension 04/22/2017  . Anxiety 04/22/2017     Past Medical History:  Diagnosis Date  . Anxiety   . Colon polyps   . Diabetes mellitus without complication ([place])   . Diverticulosis   . Food poisoning   . Gallstones   . GERD (gastroesophageal reflux disease)   . Hyperlipidemia   . Hypertension   . Obesity      Past Surgical History:  Procedure Laterality Date  . ABDOMINAL HYSTERECTOMY     total  . BREAST BIOPSY Left    neg  . CHOLECYSTECTOMY    . JOINT REPLACEMENT Bilateral    knee  . LEFT HEART CATH AND CORONARY ANGIOGRAPHY N/A 07/04/2018   Procedure: LEFT HEART CATH AND CORONARY ANGIOGRAPHY;  Surgeon: Jettie Booze, MD;  Location: [facility];  Service: Cardiovascular;  Laterality: N/A;  . REPLACEMENT TOTAL KNEE BILATERAL       Family History  Problem Relation Age of Onset  . Hypertension Mother   . Colon cancer Mother   . Stroke Father   . Heart failure Father   . Stroke Sister   . Stroke Brother   . Graves' disease Daughter   . Breast cancer Paternal Aunt   . Graves' disease Sister   . Bone cancer Sister   . Breast cancer Maternal Aunt   . Esophageal cancer Neg Hx   .  Rectal cancer Neg Hx   . Liver cancer Neg Hx      Social History   Substance and Sexual Activity  Drug Use No     Social History   Substance and Sexual Activity  Alcohol Use No     Social History   Tobacco Use  Smoking Status Former Smoker  . Packs/day: 1.00  . Years: 2.00  . Pack years: 2.00  . Last attempt to quit: 11/24/1959  . Years since quitting: 58.8  Smokeless Tobacco Never Used     Outpatient Encounter Medications as of 10/03/2018  Medication Sig  . acetaminophen (TYLENOL) 500  MG tablet Take 1,000 mg by mouth every 6 (six) hours as needed for moderate pain.  Marland Kitchen aspirin EC 81 MG tablet Take 81 mg by mouth daily.  . carvedilol (COREG) 3.125 MG tablet Take 1 tablet (3.125 mg total) by mouth 2 (two) times daily.  Marland Kitchen ezetimibe-simvastatin (VYTORIN) 10-20 MG tablet Take 1 tablet by mouth daily.  Marland Kitchen LORazepam (ATIVAN) 1 MG tablet Take 1 tablet (1 mg total) by mouth daily as needed for anxiety.  . metFORMIN (GLUCOPHAGE) 500 MG tablet 1 tab with breakfast and 1/2 tab with dinner  . omeprazole (PRILOSEC OTC) 20 MG tablet Take 20 mg by mouth daily.  . valsartan (DIOVAN) 160 MG tablet Take 160 mg by mouth daily.  . [DISCONTINUED] metFORMIN (GLUCOPHAGE) 500 MG tablet Take 1 tablet (500 mg total) by mouth 2 (two) times daily with a meal. Start taking 07/07/2018  . dicyclomine (BENTYL) 10 MG capsule Take 1 capsule (10 mg total) by mouth 3 (three) times daily as needed for spasms.   No facility-administered encounter medications on file as of 10/03/2018.     Allergies: Patient has no known allergies.  Body mass index is 31.62 kg/m.  Blood pressure (!) 149/83, pulse 73, resp. rate 16, weight 190 lb (86.2 kg), SpO2 99 %.  Review of Systems  Constitutional: Positive for fatigue. Negative for activity change, appetite change, chills, diaphoresis, fever and unexpected weight change.  Respiratory: Negative for cough, chest tightness, shortness of breath, wheezing and stridor.   Cardiovascular: Negative for chest pain, palpitations and leg swelling.  Gastrointestinal: Negative for abdominal distention, abdominal pain, blood in stool, constipation, diarrhea, nausea and vomiting.  Genitourinary: Negative for difficulty urinating and flank pain.  Skin: Negative for color change, pallor, rash and wound.  Neurological: Negative for dizziness and headaches.  Psychiatric/Behavioral: Positive for sleep disturbance. Negative for behavioral problems, confusion, decreased concentration, dysphoric  mood, hallucinations, self-injury and suicidal ideas. The patient is nervous/anxious. The patient is not hyperactive.        Objective:   Physical Exam  Constitutional: She is oriented to person, place, and time. She appears well-developed and well-nourished. No distress.  HENT:  Head: Normocephalic and atraumatic.  Right Ear: External ear normal.  Left Ear: External ear normal.  Nose: Nose normal.  Mouth/Throat: Oropharynx is clear and moist.  Cardiovascular: Normal rate, regular rhythm, normal heart sounds and intact distal pulses.  No murmur heard. Pulmonary/Chest: Effort normal and breath sounds normal. No stridor. No respiratory distress. She has no wheezes. She has no rales. She exhibits no tenderness.  Neurological: She is alert and oriented to person, place, and time.  Skin: Skin is warm and dry. Capillary refill takes less than 2 seconds. No rash noted. She is not diaphoretic. No erythema. No pallor.  Psychiatric: She has a normal mood and affect. Her behavior is normal. Judgment and thought content  normal.  Vitals reviewed.     Assessment & Plan:   1. Essential hypertension   2. Diabetes mellitus without complication ([place])   3. Healthcare maintenance   4. Other hyperlipidemia     Diabetes mellitus without complication ([place]) Continue all medications as directed, with one change: Metformin dose- 1/2 tablet with dinner A1c- 5.8, down from 6.3 in August Continue to follow Diabetic diet and walk as often as possible. Continue to check blood sugar in am and if you experience any <100 or consistent >140, please call clinic. Drink plenty of water.   Healthcare maintenance Follow-up in 3 months. Follow-up with Cardiologist as directed.   Essential hypertension BP 149/83, HR 73 Currently taking Carvedilol 3.125 BID Followed by cards, next OV 10/18/18  Hyperlipidemia 06/2018: Tot chol 85 LDL 35 TGs 128 HDL 24 Currently on Vytorin 10/20mg  QD    FOLLOW-UP:   Return in about 3 months (around 01/03/2019) for Regular Follow Up, HTN, Diabetes.

## 2018-10-03 ENCOUNTER — Encounter: Payer: Self-pay | Admitting: Adult Health

## 2018-10-03 ENCOUNTER — Ambulatory Visit (INDEPENDENT_AMBULATORY_CARE_PROVIDER_SITE_OTHER): Payer: Medicare Other | Admitting: Adult Health

## 2018-10-03 VITALS — BP 149/83 | HR 73 | Resp 16 | Wt 190.0 lb

## 2018-10-03 DIAGNOSIS — I10 Essential (primary) hypertension: Secondary | ICD-10-CM | POA: Diagnosis not present

## 2018-10-03 DIAGNOSIS — E7849 Other hyperlipidemia: Secondary | ICD-10-CM

## 2018-10-03 DIAGNOSIS — E119 Type 2 diabetes mellitus without complications: Secondary | ICD-10-CM | POA: Diagnosis not present

## 2018-10-03 DIAGNOSIS — Z0000 Encounter for general adult medical examination without abnormal findings: Secondary | ICD-10-CM | POA: Diagnosis not present

## 2018-10-03 LAB — POCT GLYCOSYLATED HEMOGLOBIN (HGB A1C): HBA1C, POC (PREDIABETIC RANGE): 5.8 % (ref 5.7–6.4)

## 2018-10-03 MED ORDER — METFORMIN HCL 500 MG PO TABS: ORAL_TABLET | ORAL | 1 refills | Status: DC

## 2018-10-03 NOTE — Assessment & Plan Note (Addendum)
BP 149/83, HR 73 Currently taking Carvedilol 3.125 BID Followed by cards, next OV 10/18/18

## 2018-10-03 NOTE — Assessment & Plan Note (Signed)
Follow-up in 3 months. Follow-up with Cardiologist as directed.

## 2018-10-03 NOTE — Assessment & Plan Note (Signed)
Continue all medications as directed, with one change: Metformin dose- 1/2 tablet with dinner A1c- 5.8, down from 6.3 in August Continue to follow Diabetic diet and walk as often as possible. Continue to check blood sugar in am and if you experience any <100 or consistent >140, please call clinic. Drink plenty of water.

## 2018-10-03 NOTE — Assessment & Plan Note (Signed)
06/2018: Tot chol 85 LDL 35 TGs 128 HDL 24 Currently on Vytorin 10/20mg  QD

## 2018-10-03 NOTE — Addendum Note (Signed)
Addended by: Narda Rutherford on: 10/03/2018 09:53 AM   Modules accepted: Orders

## 2018-10-03 NOTE — Patient Instructions (Signed)
Diabetes Insipidus Diabetes insipidus (DI) is a rare condition that causes the body to produce more urine than normal, which leads to thirst and dehydration. The urine is made mostly of water (dilute urine). There are four types of DI:  Central DI. This is the most common type.  Dipsogenic DI.  Nephrogenic DI.  Pregnancy-related DI.  The most common forms are related to decreased production of the hormone that regulates urine production (antidiuretic hormone) or resistance to this hormone. DI can be managed with treatment and is not usually serious. This condition is not related to type 1 or type 2 diabetes mellitus, in which blood sugar (glucose) levels are too high. DI affects mostly adults, but it can happen at any age. What are the causes? Central DI is caused by damage to the pituitary gland or hypothalamus in the brain. Dipsogenic DI is caused by a defect in the thirst mechanism in the brain. This defect causes you to drink too much fluid. These may result from:  Brain surgery.  Infection.  Inflammation.  Brain tumor.  Head injury.  Nephrogenic DI is caused by the kidneys not responding to the antidiuretic hormone in the body. This may result from:  Chronic kidney disease (CKD).  Certain medicines, such as lithium.  Low potassium levels.  High calcium levels.  Pregnancy-related DI is caused by the antidiuretic hormone not working properly in the body. This results from having a temporary form of diabetes mellitus that develops during pregnancy (gestational diabetes mellitus). What are the signs or symptoms? Symptoms of this condition include:  Excessive urination. This means urinating more than 10 cups (2.4 L) during a period of 24 hours.  Excessive thirst.  Frequent nighttime urination (nocturia).  Nausea.  Diarrhea.  How is this diagnosed? This condition may be diagnosed based on:  Your medical history.  A physical exam.  Blood tests.  Urine  tests.  How is this treated? Once your specific type of diabetes insipidus is diagnosed, treatment may include one or more of the following:  Increasing or limiting your fluid intake.  Taking medicines that contain artificial (synthetic) versions of the antidiuretic hormone.  Stopping certain medicines that you take.  Correcting the balance of minerals (electrolytes) in your body.  Changing your diet. You may be put on a low-protein or low-sodium diet.  You may need to visit your health care provider regularly to make sure your condition is being treated properly. You may also need to work with providers who specialize in:  Kidney problems (nephrologist).  Hormone disorders (endocrinologist).  Follow these instructions at home:  Follow instructions from your health care provider about how much fluid and water to drink. You may be directed to drink more fluids and water, or to limit how much fluid and water you drink.  Follow instructions from your health care provider about eating or drinking restrictions.  Take over-the-counter and prescription medicines only as told by your health care provider.  Return to your normal activities as told by your health care provider. Ask your health care provider what activities are safe for you.  If directed, monitor your risk of dehydration in extreme heat.  Keep all follow-up visits as told by your health care provider. This is important.  Carry a medical alert card or wear medical alert jewelry. Contact a health care provider if:  You continue to have symptoms after treatment. Get help right away if:  You have extreme thirst.  You have symptoms of severe dehydration, such as  rapid heart rate, muscle cramps, or confusion. Summary  Diabetes insipidus (DI) is a rare condition that causes the body to produce more urine than normal, which leads to thirst and dehydration.  Follow instructions from your health care provider about eating  or drinking restrictions.  Treatment may include increasing or limiting your fluid intake and correcting the balance of minerals (electrolytes) in your body.  Get help right away if you have symptoms of severe dehydration, such as rapid heart rate, muscle cramps, or confusion. This information is not intended to replace advice given to you by your health care provider. Make sure you discuss any questions you have with your health care provider. Document Released: 11/14/2013 Document Revised: 08/18/2016 Document Reviewed: 08/10/2016 Elsevier Interactive Patient Education  2018 [place] all medications as directed, with one change: Metformin dose- 1/2 tablet with dinner A1c- 5.8, down from 6.3 in August Continue to follow Diabetic diet and walk as often as possible. Continue to check blood sugar in am and if you experience any <100 or consistent >140, please call clinic. Drink plenty of water. Follow-up in 3 months. Follow-up with Cardiologist as directed.  GREAT JOB ON YOUR A1c! NICE TO SEE YOU!

## 2018-10-12 ENCOUNTER — Ambulatory Visit
Admission: RE | Admit: 2018-10-12 | Discharge: 2018-10-12 | Disposition: A | Discharge status: Home / Self Care | Payer: Medicare Other | Source: Ambulatory Visit | Attending: Adult Health | Admitting: Adult Health

## 2018-10-12 DIAGNOSIS — Z780 Asymptomatic menopausal state: Secondary | ICD-10-CM | POA: Diagnosis not present

## 2018-10-12 DIAGNOSIS — E2839 Other primary ovarian failure: Secondary | ICD-10-CM | POA: Insufficient documentation

## 2018-10-13 ENCOUNTER — Other Ambulatory Visit: Payer: Medicare Other

## 2018-10-13 NOTE — Progress Notes (Signed)
Pt brought orders from Dr. Debara Pickett.  Charyl Bigger, CMA

## 2018-10-14 LAB — LIPID PANEL
CHOLESTEROL TOTAL: 120 mg/dL (ref 100–199)
Chol/HDL Ratio: 3.9 ratio (ref 0.0–4.4)
HDL: 31 mg/dL — AB (ref >39)
LDL CALC: 57 mg/dL (ref 0–99)
Triglycerides: 162 mg/dL — ABNORMAL HIGH (ref 0–149)
VLDL CHOLESTEROL CAL: 32 mg/dL (ref 5–40)

## 2018-10-18 ENCOUNTER — Encounter: Payer: Self-pay | Admitting: Internal Medicine

## 2018-10-18 ENCOUNTER — Ambulatory Visit: Payer: Medicare Other | Admitting: Internal Medicine

## 2018-10-18 VITALS — BP 136/78 | HR 78 | Ht 65.0 in | Wt 184.6 lb

## 2018-10-18 DIAGNOSIS — I2510 Atherosclerotic heart disease of native coronary artery without angina pectoris: Secondary | ICD-10-CM

## 2018-10-18 DIAGNOSIS — I10 Essential (primary) hypertension: Secondary | ICD-10-CM | POA: Diagnosis not present

## 2018-10-18 DIAGNOSIS — E7849 Other hyperlipidemia: Secondary | ICD-10-CM

## 2018-10-18 NOTE — Progress Notes (Signed)
OFFICE NOTE  Chief Complaint:  Routine follow-up  Primary Care Physician: Esaw Grandchild, NP  HPI:  Andrea Brooks is a 82 y.o. female with a past medial history significant for anxiety, type 2 diabetes, GERD, hypertension and dyslipidemia, who recently was admitted for persistent diarrhea, found to have an E. coli infection.  Subsequently after significant hydration she developed what was suspected to be acute diastolic congestive heart failure.  This required diuresis and she was seen by Dr. Radford Pax and Dr. Oval Linsey.  She was noted to have elevated troponin was referred for heart catheterization which showed mild nonobstructive coronary disease.  As her husband is a patient of mine, she requested to follow-up with me.  She reports her diarrhea has improved significantly.  She saw Dr. Loletha Carrow, who is been working with her to treat that.  While hospitalized they recommended starting appropriate heart failure treatment.  An echo was performed which showed normal systolic function and mild diastolic dysfunction.  It was recommended she go on to low-dose metoprolol and that her Vytorin's be switched over to atorvastatin for better LDL reduction.  She was leery of making those changes and wanted to discuss it with me further.  She did see her primary care provider in follow-up in a repeat lipid profile was performed.  This was 2 weeks ago and demonstrated total cholesterol of 85, triglycerides 128, HDL 24 and LDL 35.  At this point, it is not likely that she needs to switch cholesterol medications, and the low numbers are most likely related to her diarrhea over the past several weeks.  I suspect they are in fact falsely lowered because of this.  Overall though she feels better, denying any worsening shortness of breath or chest pain.  Pressure is mildly elevated 148/82 today.  She says is been running in the 140s at home.  10/18/2018  Andrea Brooks is seen today in routine follow-up.  Overall she is doing  well.  She is down about 6 pounds since I last saw her.  Some of that was fluid related to her diastolic heart failure also after her significant E. coli infection she is not had much of an appetite and continues to have IBS-D symptoms.  Overall she denies any chest pain or worsening shortness of breath.  We did repeat some labs indicated a small increase in her lipid profile.  Total cholesterol now 120, triglycerides 162, HDL 31 and LDL 57.  She says she did have a decrease in metformin due to improving hemoglobin A1c is 5.6.  EKG today shows sinus rhythm with left anterior fascicular block at 78.  PMHx:  Past Medical History:  Diagnosis Date  . Anxiety   . Colon polyps   . Diabetes mellitus without complication ([place])   . Diverticulosis   . Food poisoning   . Gallstones   . GERD (gastroesophageal reflux disease)   . Hyperlipidemia   . Hypertension   . Obesity     Past Surgical History:  Procedure Laterality Date  . ABDOMINAL HYSTERECTOMY     total  . BREAST BIOPSY Left    neg  . CHOLECYSTECTOMY    . JOINT REPLACEMENT Bilateral    knee  . LEFT HEART CATH AND CORONARY ANGIOGRAPHY N/A 07/04/2018   Procedure: LEFT HEART CATH AND CORONARY ANGIOGRAPHY;  Surgeon: Jettie Booze, MD;  Location: [facility];  Service: Cardiovascular;  Laterality: N/A;  . REPLACEMENT TOTAL KNEE BILATERAL      FAMHx:  Family  History  Problem Relation Age of Onset  . Hypertension Mother   . Colon cancer Mother   . Stroke Father   . Heart failure Father   . Stroke Sister   . Stroke Brother   . Graves' disease Daughter   . Breast cancer Paternal Aunt   . Graves' disease Sister   . Bone cancer Sister   . Breast cancer Maternal Aunt   . Esophageal cancer Neg Hx   . Rectal cancer Neg Hx   . Liver cancer Neg Hx     SOCHx:   reports that she quit smoking about 58 years ago. She has a 2.00 pack-year smoking history. She has never used smokeless tobacco. She reports that she does not drink  alcohol or use drugs.  ALLERGIES:  No Known Allergies  ROS: Pertinent items noted in HPI and remainder of comprehensive ROS otherwise negative.  HOME MEDS: Current Outpatient Medications on File Prior to Visit  Medication Sig Dispense Refill  . acetaminophen (TYLENOL) 500 MG tablet Take 1,000 mg by mouth every 6 (six) hours as needed for moderate pain.    Marland Kitchen aspirin EC 81 MG tablet Take 81 mg by mouth daily.    . carvedilol (COREG) 3.125 MG tablet Take 1 tablet (3.125 mg total) by mouth 2 (two) times daily. 180 tablet 3  . ezetimibe-simvastatin (VYTORIN) 10-20 MG tablet Take 1 tablet by mouth daily. 90 tablet 0  . LORazepam (ATIVAN) 1 MG tablet Take 1 tablet (1 mg total) by mouth daily as needed for anxiety. 30 tablet 0  . metFORMIN (GLUCOPHAGE) 500 MG tablet 1 tab with breakfast and 1/2 tab with dinner 180 tablet 1  . omeprazole (PRILOSEC OTC) 20 MG tablet Take 20 mg by mouth daily.    . valsartan (DIOVAN) 160 MG tablet Take 160 mg by mouth daily.    Marland Kitchen dicyclomine (BENTYL) 10 MG capsule Take 1 capsule (10 mg total) by mouth 3 (three) times daily as needed for spasms. 60 capsule 0   No current facility-administered medications on file prior to visit.     LABS/IMAGING: No results found for this or any previous visit (from the past 48 hour(s)). No results found.  LIPID PANEL:    Component Value Date/Time   CHOL 120 10/13/2018 0903   TRIG 162 (H) 10/13/2018 0903   HDL 31 (L) 10/13/2018 0903   CHOLHDL 3.9 10/13/2018 0903   CHOLHDL 3.5 07/04/2018 0526   VLDL 26 07/04/2018 0526   LDLCALC 57 10/13/2018 0903     WEIGHTS: Wt Readings from Last 3 Encounters:  10/18/18 184 lb 9.6 oz (83.7 kg)  10/03/18 190 lb (86.2 kg)  09/14/18 186 lb (84.4 kg)    VITALS: BP 136/78   Pulse 78   Ht 5\' 5"  (1.651 m)   Wt 184 lb 9.6 oz (83.7 kg)   BMI 30.72 kg/m   EXAM: General appearance: alert and no distress Neck: no carotid bruit, no JVD and thyroid not enlarged, symmetric, no  tenderness/mass/nodules Lungs: clear to auscultation bilaterally Heart: regular rate and rhythm, S1, S2 normal, no murmur, click, rub or gallop Abdomen: soft, non-tender; bowel sounds normal; no masses,  no organomegaly Extremities: extremities normal, atraumatic, no cyanosis or edema Pulses: 2+ and symmetric Skin: Skin color, texture, turgor normal. No rashes or lesions Neurologic: Grossly normal Psych: Pleasant  EKG: Sinus rhythm 78 left anterior fascicular block-personally reviewed  ASSESSMENT: 1. Acute diastolic congestive heart failure (EF 60 to 65%, 06/2018) 2. Dyslipidemia 3. Hypertension 4. Mild  nonobstructive coronary disease (cath 06/2018)  PLAN: 1.   Andrea Brooks continues to do well and is lost an additional 6 pounds has been last saw her.  She appears compensated with regards to heart failure.  Her triglycerides are slightly higher may be related to a decrease in her metformin however her hemoglobin A1c was 5.6.  She still has a fairly well treated lipid profile.  Blood pressures controlled.  Plan follow-up with me in 6 months or sooner as necessary.  Pixie Casino, MD, [facility], [place] Director of the Advanced Lipid Disorders &  Cardiovascular Risk Reduction Clinic Diplomate of the American Board of Clinical Lipidology Attending Cardiologist  Direct Dial: [phone]  Fax: [phone]  Website:  [email or web address] 10/18/2018, 11:58 AM

## 2018-10-18 NOTE — Patient Instructions (Addendum)
Medication Instructions:  Continue current medications If you need a refill on your cardiac medications before your next appointment, please call your pharmacy.   Lab work: NONE  Testing/Procedures: NONE  Follow-Up: At [facility], you and your health needs are our priority.  As part of our continuing mission to provide you with exceptional heart care, we have created designated Provider Care Teams.  These Care Teams include your primary Cardiologist (physician) and Advanced Practice Providers (APPs -  Physician Assistants and Nurse Practitioners) who all work together to provide you with the care you need, when you need it. You will need a follow up appointment in 6 months.  Please call our office 2 months in advance to schedule this appointment.  You may see Dr. Hilty or one of the following Advanced Practice Providers on your designated Care Team: Hao Meng, PA-C . Angela Duke, PA-C  Any Other Special Instructions Will Be Listed Below (If Applicable).    

## 2018-11-04 ENCOUNTER — Other Ambulatory Visit: Payer: Self-pay | Admitting: Adult Health

## 2018-11-04 DIAGNOSIS — Z1231 Encounter for screening mammogram for malignant neoplasm of breast: Secondary | ICD-10-CM

## 2018-12-14 ENCOUNTER — Other Ambulatory Visit: Payer: Self-pay | Admitting: Adult Health

## 2018-12-16 ENCOUNTER — Ambulatory Visit
Admission: RE | Admit: 2018-12-16 | Discharge: 2018-12-16 | Disposition: A | Discharge status: Home / Self Care | Payer: Medicare HMO | Source: Ambulatory Visit | Attending: Adult Health | Admitting: Adult Health

## 2018-12-16 ENCOUNTER — Other Ambulatory Visit: Payer: Self-pay | Admitting: Adult Health

## 2018-12-16 DIAGNOSIS — Z1231 Encounter for screening mammogram for malignant neoplasm of breast: Secondary | ICD-10-CM | POA: Insufficient documentation

## 2018-12-28 NOTE — Progress Notes (Signed)
Subjective:    Patient ID: Andrea Brooks, female    DOB: 1936/05/01, 83 y.o.   MRN: [id number]  HPI:09/14/18 OV:  Andrea Brooks is here with several issues to discuss: 1) Reports "feeling more stooped over".  She reports never having had DEXA scan. She is not currently taking Vit d or Calcium Supplment 2) She reports home BP readings: SBP 120-140 DBP 70-80 Denies CP/increased dyspnea/palpitations 3) Bout of N/V/hiccups last week that required ED visit, treated with Ondansetron and Metoclopramide. She denies any GI sx's currently, just lingering fatigue She is non-fasting today, therefore cannot obtain lipid panel today Of note- Cards OV 07/22/18: Started on carvedilol 3.125 mg twice daily for better blood pressure control f/u in 6 months  10/03/18 OV: Andrea Brooks is here for f/u: HTN, T2D She reports AM BS 110-120s, denies episodes of hypoglycemia She has been increasing regular walking with dog and in her yard She has dramatically reduced CHO/sugar intake She denies CP/dyspnea/dizziness/HA/palpitations Recent OV with GI, she likely has IBS treated with diet modifications Foot Examination- normal Next cards OV 11/12/29/17  01/03/2019 OV: Andrea Brooks is here for 3 month f/u: HTN, T2D Home BG- 110-120, denies episodes of hypoglycemia She reports continues chronic back pain, despite daily stretching and home exercise- agreeable to PT referral She reports medications compliance, denies SE She continues to drink water all day and consistently follows diabetic diet She estimates to 1/2 Lorazepam tablet 1-2 times/month  Lab Results  Component Value Date   HGBA1C 6.3 (A) 01/03/2019   HGBA1C 5.8 10/03/2018   HGBA1C 6.3 (H) 06/28/2018    Patient Care Team    Relationship Specialty Notifications Start End  Mina Marble D, NP PCP - General Family Medicine  04/22/17   Sueanne Margarita, MD PCP - Cardiology Cardiology Admissions 07/04/18     Patient Active Problem List   Diagnosis Date  Noted  . Chronic thoracic back pain 01/03/2019  . Acute congestive heart failure ([place])   . Non-ST elevation (NSTEMI) myocardial infarction ([place])   . Acute diastolic (congestive) heart failure ([place]) 07/02/2018  . Elevated troponin 07/02/2018  . Diabetes mellitus without complication ([place]) 41/93/7902  . Diverticulitis-  mild symptoms 03/30/2018  . Irritable bowel syndrome with both constipation and diarrhea 03/30/2018  . Healthcare maintenance 03/17/2018  . Pain of right heel 07/06/2017  . Loose stools 06/24/2017  . Right-sided thoracic back pain 06/24/2017  . Hyperlipidemia 04/22/2017  . GERD (gastroesophageal reflux disease) 04/22/2017  . Impaired glucose metabolism 04/22/2017  . Essential hypertension 04/22/2017  . Anxiety 04/22/2017     Past Medical History:  Diagnosis Date  . Anxiety   . Colon polyps   . Diabetes mellitus without complication ([place])   . Diverticulosis   . Food poisoning   . Gallstones   . GERD (gastroesophageal reflux disease)   . Hyperlipidemia   . Hypertension   . Obesity      Past Surgical History:  Procedure Laterality Date  . ABDOMINAL HYSTERECTOMY     total  . BREAST BIOPSY Left    neg  . CHOLECYSTECTOMY    . JOINT REPLACEMENT Bilateral    knee  . LEFT HEART CATH AND CORONARY ANGIOGRAPHY N/A 07/04/2018   Procedure: LEFT HEART CATH AND CORONARY ANGIOGRAPHY;  Surgeon: Jettie Booze, MD;  Location: [facility];  Service: Cardiovascular;  Laterality: N/A;  . REPLACEMENT TOTAL KNEE BILATERAL       Family History  Problem Relation Age of Onset  . Hypertension  Mother   . Colon cancer Mother   . Stroke Father   . Heart failure Father   . Stroke Sister   . Stroke Brother   . Graves' disease Daughter   . Breast cancer Paternal Aunt   . Graves' disease Sister   . Bone cancer Sister   . Breast cancer Maternal Aunt   . Esophageal cancer Neg Hx   . Rectal cancer Neg Hx   . Liver cancer Neg Hx      Social History    Substance and Sexual Activity  Drug Use No     Social History   Substance and Sexual Activity  Alcohol Use No     Social History   Tobacco Use  Smoking Status Former Smoker  . Packs/day: 1.00  . Years: 2.00  . Pack years: 2.00  . Last attempt to quit: 11/24/1959  . Years since quitting: 59.1  Smokeless Tobacco Never Used     Outpatient Encounter Medications as of 01/03/2019  Medication Sig  . acetaminophen (TYLENOL) 500 MG tablet Take 1,000 mg by mouth every 6 (six) hours as needed for moderate pain.  Marland Kitchen aspirin EC 81 MG tablet Take 81 mg by mouth daily.  . carvedilol (COREG) 3.125 MG tablet Take 3.125 mg by mouth 2 (two) times daily with a meal.  . dicyclomine (BENTYL) 10 MG capsule Take 10 mg by mouth 4 (four) times daily -  before meals and at bedtime.  Marland Kitchen ezetimibe-simvastatin (VYTORIN) 10-20 MG tablet TAKE 1 TABLET DAILY  . LORazepam (ATIVAN) 1 MG tablet Take 1 tablet (1 mg total) by mouth daily as needed for anxiety.  . metFORMIN (GLUCOPHAGE) 500 MG tablet 1 tab with breakfast and 1 tab with dinner  . omeprazole (PRILOSEC OTC) 20 MG tablet Take 20 mg by mouth daily.  . valsartan (DIOVAN) 160 MG tablet Take 160 mg by mouth daily.  . [DISCONTINUED] LORazepam (ATIVAN) 1 MG tablet Take 1 tablet (1 mg total) by mouth daily as needed for anxiety.  . [DISCONTINUED] metFORMIN (GLUCOPHAGE) 500 MG tablet 1 tab with breakfast and 1/2 tab with dinner  . [DISCONTINUED] carvedilol (COREG) 3.125 MG tablet Take 1 tablet (3.125 mg total) by mouth 2 (two) times daily.  . [DISCONTINUED] dicyclomine (BENTYL) 10 MG capsule Take 1 capsule (10 mg total) by mouth 3 (three) times daily as needed for spasms.   No facility-administered encounter medications on file as of 01/03/2019.     Allergies: Patient has no known allergies.  Body mass index is 30.5 kg/m.  Blood pressure 130/72, pulse 74, temperature 98 F (36.7 C), temperature source Oral, height 5\' 5"  (1.651 m), weight 183 lb 4.8 oz  (83.1 kg), SpO2 98 %.  Review of Systems  Constitutional: Positive for fatigue. Negative for activity change, appetite change, chills, diaphoresis, fever and unexpected weight change.  Respiratory: Negative for cough, chest tightness, shortness of breath, wheezing and stridor.   Cardiovascular: Negative for chest pain, palpitations and leg swelling.  Gastrointestinal: Negative for abdominal distention, abdominal pain, blood in stool, constipation, diarrhea, nausea and vomiting.  Genitourinary: Negative for difficulty urinating and flank pain.  Musculoskeletal: Positive for arthralgias, back pain, gait problem, myalgias and neck stiffness. Negative for joint swelling and neck pain.  Skin: Negative for color change, pallor, rash and wound.  Neurological: Negative for dizziness and headaches.  Hematological: Does not bruise/bleed easily.  Psychiatric/Behavioral: Positive for sleep disturbance. Negative for behavioral problems, confusion, decreased concentration, dysphoric mood, hallucinations, self-injury and suicidal ideas. The patient  is not nervous/anxious and is not hyperactive.        Objective:   Physical Exam Vitals signs reviewed.  Constitutional:      General: She is not in acute distress.    Appearance: She is well-developed. She is not diaphoretic.  HENT:     Head: Normocephalic and atraumatic.     Right Ear: External ear normal.     Left Ear: External ear normal.     Nose: Nose normal.  Cardiovascular:     Rate and Rhythm: Normal rate and regular rhythm.     Heart sounds: Normal heart sounds. No murmur.  Pulmonary:     Effort: Pulmonary effort is normal. No respiratory distress.     Breath sounds: Normal breath sounds. No stridor. No wheezing or rales.  Chest:     Chest wall: No tenderness.  Musculoskeletal:        General: Tenderness present.     Thoracic back: She exhibits decreased range of motion and tenderness.  Skin:    General: Skin is warm and dry.      Capillary Refill: Capillary refill takes less than 2 seconds.     Coloration: Skin is not pale.     Findings: No erythema or rash.  Neurological:     Mental Status: She is alert and oriented to person, place, and time.  Psychiatric:        Behavior: Behavior normal.        Thought Content: Thought content normal.        Judgment: Judgment normal.       Assessment & Plan:   1. Diabetes mellitus without complication ([place])   2. Chronic thoracic back pain, unspecified back pain laterality   3. Healthcare maintenance   4. Anxiety   5. Essential hypertension     Healthcare maintenance Continue all medications as directed, with one change- Metformin 500mg  one tablet with breakfast and dinner (instead of half tab with dinner). Continue to drink plenty of water and follow Diabetic diet. Remain as active as possible and to assist with chronic back pain- referral to Physical Therapy placed. Follow-up 3 months Medicare Wellness.  [place] Controlled Substance Database reviewed- no aberrancies noted She estimates to 1/2 Lorazepam tablet 1-2 times/month Refill sent in  Diabetes mellitus without complication ([facility]) Lab Results  Component Value Date   HGBA1C 6.3 (A) 01/03/2019   HGBA1C 5.8 10/03/2018   HGBA1C 6.3 (H) 06/28/2018  Increased Metformin 500mg  back to BID to adjust for small bump in A1c  Essential hypertension BP at goal 130/72, HR 74 Continue current regime, remain as active as possible  Chronic thoracic back pain PT referral placed    FOLLOW-UP:  Return in about 3 months (around 04/03/2019) for Medical Weight Loss.

## 2019-01-03 ENCOUNTER — Encounter: Payer: Self-pay | Admitting: Adult Health

## 2019-01-03 ENCOUNTER — Ambulatory Visit: Payer: Medicare HMO | Admitting: Adult Health

## 2019-01-03 VITALS — BP 130/72 | HR 74 | Temp 98.0°F | Ht 65.0 in | Wt 183.3 lb

## 2019-01-03 DIAGNOSIS — G8929 Other chronic pain: Secondary | ICD-10-CM

## 2019-01-03 DIAGNOSIS — E119 Type 2 diabetes mellitus without complications: Secondary | ICD-10-CM | POA: Diagnosis not present

## 2019-01-03 DIAGNOSIS — Z0000 Encounter for general adult medical examination without abnormal findings: Secondary | ICD-10-CM | POA: Diagnosis not present

## 2019-01-03 DIAGNOSIS — M546 Pain in thoracic spine: Secondary | ICD-10-CM | POA: Diagnosis not present

## 2019-01-03 DIAGNOSIS — F419 Anxiety disorder, unspecified: Secondary | ICD-10-CM

## 2019-01-03 DIAGNOSIS — R69 Illness, unspecified: Secondary | ICD-10-CM | POA: Diagnosis not present

## 2019-01-03 DIAGNOSIS — I10 Essential (primary) hypertension: Secondary | ICD-10-CM

## 2019-01-03 LAB — POCT GLYCOSYLATED HEMOGLOBIN (HGB A1C): HEMOGLOBIN A1C: 6.3 % — AB (ref 4.0–5.6)

## 2019-01-03 MED ORDER — LORAZEPAM 1 MG PO TABS: 1.0000 mg | ORAL_TABLET | Freq: Every day | ORAL | 0 refills | Status: DC | PRN

## 2019-01-03 MED ORDER — METFORMIN HCL 500 MG PO TABS: ORAL_TABLET | ORAL | 1 refills | Status: DC

## 2019-01-03 NOTE — Assessment & Plan Note (Signed)
Lab Results  Component Value Date   HGBA1C 6.3 (A) 01/03/2019   HGBA1C 5.8 10/03/2018   HGBA1C 6.3 (H) 06/28/2018  Increased Metformin 500mg  back to BID to adjust for small bump in A1c

## 2019-01-03 NOTE — Assessment & Plan Note (Addendum)
BP at goal 130/72, HR 74 Continue current regime, remain as active as possible

## 2019-01-03 NOTE — Patient Instructions (Addendum)

## 2019-01-03 NOTE — Assessment & Plan Note (Signed)
Continue all medications as directed, with one change- Metformin 500mg  one tablet with breakfast and dinner (instead of half tab with dinner). Continue to drink plenty of water and follow Diabetic diet. Remain as active as possible and to assist with chronic back pain- referral to Physical Therapy placed. Follow-up 3 months Medicare Wellness.

## 2019-01-03 NOTE — Assessment & Plan Note (Signed)
[place] Controlled Substance Database reviewed- no aberrancies noted She estimates to 1/2 Lorazepam tablet 1-2 times/month Refill sent in

## 2019-01-03 NOTE — Assessment & Plan Note (Signed)
PT referral placed.

## 2019-01-16 ENCOUNTER — Ambulatory Visit: Payer: Medicare HMO | Attending: Adult Health | Admitting: Physical Therapy

## 2019-01-16 ENCOUNTER — Encounter: Payer: Self-pay | Admitting: Physical Therapy

## 2019-01-16 ENCOUNTER — Other Ambulatory Visit: Payer: Self-pay

## 2019-01-16 DIAGNOSIS — M546 Pain in thoracic spine: Secondary | ICD-10-CM | POA: Diagnosis not present

## 2019-01-16 DIAGNOSIS — R293 Abnormal posture: Secondary | ICD-10-CM

## 2019-01-16 NOTE — Therapy (Signed)
[place], [place], [address] Phone: [phone]   Fax:  [phone]  Physical Therapy Evaluation  Patient Details  Name: Andrea Brooks MRN: [id number] Date of Birth: March 16, 1936 Referring Provider (PT): Mina Marble, NP   Encounter Date: 01/16/2019  PT End of Session - 01/16/19 1313    Visit Number  1    Number of Visits  12    Date for PT Re-Evaluation  02/27/19    Authorization Type  [employer] Medicare    PT Start Time  1147    PT Stop Time  1234    PT Time Calculation (min)  47 min    Activity Tolerance  Patient tolerated treatment well    Behavior During Therapy  [facility] for tasks assessed/performed       Past Medical History:  Diagnosis Date  . Anxiety   . Colon polyps   . Diabetes mellitus without complication ([place])   . Diverticulosis   . Food poisoning   . Gallstones   . GERD (gastroesophageal reflux disease)   . Hyperlipidemia   . Hypertension   . Obesity     Past Surgical History:  Procedure Laterality Date  . ABDOMINAL HYSTERECTOMY     total  . BREAST BIOPSY Left    neg  . CHOLECYSTECTOMY    . JOINT REPLACEMENT Bilateral    knee  . LEFT HEART CATH AND CORONARY ANGIOGRAPHY N/A 07/04/2018   Procedure: LEFT HEART CATH AND CORONARY ANGIOGRAPHY;  Surgeon: Jettie Booze, MD;  Location: [facility];  Service: Cardiovascular;  Laterality: N/A;  . REPLACEMENT TOTAL KNEE BILATERAL      There were no vitals filed for this visit.   Subjective Assessment - 01/16/19 1154    Subjective  Pt. is a 83 y/o female referred to PT with c/o upper back pain. Pt. reports began having increased difficulty with walking tolerance following hospitalization this past August for E. Coli infection, also had further decrease in activity tolerance for walking after hospitalization in October for nausea/vomiting issues-see PMH. Pt. denies radicular symptoms or new bowel/bladder changes. Pt. also had  recent bone density scan which was normal. Her primary complaints are difficulty maintaining upright posture and limited tolerance activities such as shopping in community due to thoracic pain.    Pertinent History  anxiety, diabetes, hospitalizations 8/19, 10/19, IBS    Limitations  Walking;House hold activities    How long can you sit comfortably?  no limitations    How long can you stand comfortably?  no limitations    How long can you walk comfortably?  5 minutes    Patient Stated Goals  "Be erect and walk"    Currently in Pain?  No/denies   no pain at eval but reports dull thoracic region pain 5/10 with walking for shopping        [facility] PT Assessment - 01/16/19 0001      Assessment   Medical Diagnosis  Chronic thoracic back pain    Referring Provider (PT)  Mina Marble, NP    Onset Date/Surgical Date  06/28/18    Next MD Visit  04/04/2019    Prior Therapy  none      Precautions   Precautions  None      Restrictions   Weight Bearing Restrictions  No      Balance Screen   Has the patient fallen in the past 6 months  No      Home Environment  Living Environment  --   lives with spouse in 2 story home     Prior Function   Level of Independence  Independent with community mobility without device      Cognition   Overall Cognitive Status  Within Functional Limits for tasks assessed      Observation/Other Assessments   Focus on Therapeutic Outcomes (FOTO)   60% limited      Posture/Postural Control   Posture Comments  Increased thoracic kyphosis with forward head, rounded shoulders bilat.      ROM / Strength   AROM / PROM / Strength  AROM;Strength      AROM   AROM Assessment Site  Shoulder;Cervical;Lumbar    Right/Left Shoulder  Right;Left    Right Shoulder Flexion  120 Degrees    Right Shoulder ABduction  110 Degrees    Right Shoulder Internal Rotation  --   reach to T10   Right Shoulder External Rotation  --   50 deg at 0 deg abd   Left Shoulder Flexion   130 Degrees    Left Shoulder ABduction  110 Degrees    Left Shoulder Internal Rotation  --   reach to T10   Left Shoulder External Rotation  --   50 deg at 0 deg abd   Cervical Flexion  53    Cervical Extension  32    Cervical - Right Side Bend  30    Cervical - Left Side Bend  30    Cervical - Right Rotation  60    Cervical - Left Rotation  55    Lumbar Flexion  65    Lumbar Extension  10    Lumbar - Right Side Bend  25    Lumbar - Left Side Bend  18    Lumbar - Right Rotation  60%    Lumbar - Left Rotation  50%      Strength   Overall Strength Comments  Bilat. UE/LE MMTs grossly 5/5 excepting right shoulder ER 4/5      Flexibility   Soft Tissue Assessment /Muscle Length  --   Tightness bilat. thoracolumbar paraspinals     Ambulation/Gait   Gait Comments  Pt. ambulates in clinic independently without AD, no signifcant gait deficits noted                Objective measurements completed on examination: See above findings.      [facility] Adult PT Treatment/Exercise - 01/16/19 0001      Exercises   Exercises  --   brief HEP instruction-see handout            PT Education - 01/16/19 1312    Education Details  HEP, posture, symptom etiology    Person(s) Educated  Patient;Spouse    Methods  Explanation;Demonstration;Tactile cues;Verbal cues;Handout    Comprehension  Returned demonstration;Verbalized understanding       PT Short Term Goals - 01/16/19 1319      PT SHORT TERM GOAL #1   Title  Independent with HEP    Baseline  needs HEP    Time  3    Period  Weeks    Status  New      PT SHORT TERM GOAL #2   Title  Return postural demos to decrease thoracic pain and improve walking tolerance    Baseline  needs cues/has difficulty    Time  3    Period  Weeks    Status  New  PT Long Term Goals - 01/16/19 1320      PT LONG TERM GOAL #1   Title  Improve FOTO outcome measure score to 46% or less impairment    Baseline  60% limited    Time  6     Period  Weeks    Status  New    Target Date  02/27/19      PT LONG TERM GOAL #2   Title  Tolerate ambulation for grocery shopping periods 30-40 min with thoracic pain 2/10 or less    Baseline  difficulty/unable, pain 5/10    Time  6    Period  Weeks    Status  New    Target Date  02/27/19      PT LONG TERM GOAL #3   Title  Increase bilat. shoulder flexion AROM 10-20 deg to improve ability to put dishes away in overhead cabinets    Baseline  Rt. 120, Lt. 130    Time  6    Period  Weeks    Status  New    Target Date  02/27/19      PT LONG TERM GOAL #4   Title  Independent with advanced HEP for independent therapy progression    Time  6    Period  Weeks    Status  New    Target Date  02/27/19             Plan - 01/16/19 1313    Clinical Impression Statement  Pt. presents with thoracic pain with postural compromise with associated functional limitations for mobility tolerance. Given exam findings and subjective report would suspect associated with muscular deconditioning s/p hospitalizations. Pt. would benefit from PT to address current associated functional limitations.    History and Personal Factors relevant to plan of care:  Diabetes, anxiety, age, comorbidities, IBS    Clinical Presentation  Stable    Clinical Decision Making  Low    Rehab Potential  Good    PT Frequency  2x / week    PT Duration  6 weeks    PT Treatment/Interventions  ADLs/Self Care Home Management;Moist Heat;Cryotherapy;Ultrasound;Electrical Stimulation;Gait training;Functional mobility training;Manual techniques;Dry needling;Patient/family education;Therapeutic activities;Therapeutic exercise;Neuromuscular re-education;Taping    PT Next Visit Plan  Review HEP as needed, NUSTEP warm up, postural strengthening, add cervical retractions and pec stretch as well as sit<>stand/box squat from table as tolerated, STM if needed thoracolumbar paraspinals, heat prn    PT Home Exercise Plan  seated thoracic  extension, Theraband row/scapular retraction, Theraband ext, supine Theraband horiz. abduction    Consulted and Agree with Plan of Care  Patient       Patient will benefit from skilled therapeutic intervention in order to improve the following deficits and impairments:  Pain, Improper body mechanics, Postural dysfunction, Difficulty walking, Decreased activity tolerance, Decreased endurance, Decreased strength  Visit Diagnosis: Pain in thoracic spine  Abnormal posture     Problem List Patient Active Problem List   Diagnosis Date Noted  . Chronic thoracic back pain 01/03/2019  . Acute congestive heart failure ([place])   . Non-ST elevation (NSTEMI) myocardial infarction ([place])   . Acute diastolic (congestive) heart failure ([place]) 07/02/2018  . Elevated troponin 07/02/2018  . Diabetes mellitus without complication ([place]) 89/16/9450  . Diverticulitis-  mild symptoms 03/30/2018  . Irritable bowel syndrome with both constipation and diarrhea 03/30/2018  . Healthcare maintenance 03/17/2018  . Pain of right heel 07/06/2017  . Loose stools 06/24/2017  . Right-sided thoracic back pain  06/24/2017  . Hyperlipidemia 04/22/2017  . GERD (gastroesophageal reflux disease) 04/22/2017  . Impaired glucose metabolism 04/22/2017  . Essential hypertension 04/22/2017  . Anxiety 04/22/2017   Beaulah Dinning, PT, DPT 01/16/19 1:23 PM  [facility] [address] [place], [place], [address] Phone: [phone]   Fax:  [phone]  Name: Andrea Brooks MRN: [id number] Date of Birth: 11/18/1936

## 2019-01-19 ENCOUNTER — Telehealth: Payer: Self-pay | Admitting: Internal Medicine

## 2019-01-19 ENCOUNTER — Telehealth: Payer: Self-pay | Admitting: Adult Health

## 2019-01-19 NOTE — Telephone Encounter (Signed)
Patient think fatigue / weakness caused by : ezetimibe-simvastatin (VYTORIN) 10-20 MG tablet [[id number]]   Order Details  Dose, Route, Frequency: As Directed   Dispense Quantity: 90 tablet Refills: 0 Fills remaining: --        Sig: TAKE 1 TABLET DAILY     ---Patient request provider / medical assistant contact her to discuss issue ,she has already spoken to Dr. Debara Pickett nurse/ [place] @ [phone] to be removed frm Cholesterol meds but was advised to contact PCP to see if cutting back on Vytorin will help reduce weakness & fatigue.  --glh

## 2019-01-19 NOTE — Telephone Encounter (Signed)
Pt aware of recommendations and pt agrees /cy

## 2019-01-19 NOTE — Telephone Encounter (Signed)
Spoke with pt and pt  has noted for approx 2 months has felt weakness feels like is slumping in posture (bending over when walking) bone density  testing was fine Also notes when putting items in cabinet has limb weakness as well Pt feels that Carvedilol may be culprit. Will forward message to Dr Debara Pickett for review .Adonis Housekeeper

## 2019-01-19 NOTE — Telephone Encounter (Signed)
Seems unlikely - that is a low dose and she has been on it for over 6 months ... not sure why she would have that side effect now. Would advise she mention this to her PCP for more work-up. She needs the beta blocker for her heart failure.  Dr. Lemmie Evens

## 2019-01-19 NOTE — Telephone Encounter (Signed)
  Pt c/o medication issue:  1. Name of Medication: carvedilol (COREG) 3.125 MG tablet  2. How are you currently taking this medication (dosage and times per day)? Take 3.125 mg by mouth 2 (two) times daily with a meal.  3. Are you having a reaction (difficulty breathing--STAT)?  No  4. What is your medication issue? Fatigue, can't walk any length without bending over

## 2019-01-20 NOTE — Telephone Encounter (Signed)
LVM for pt to call to discuss.  T. Delia Slatten, CMA  

## 2019-01-23 ENCOUNTER — Encounter: Payer: Self-pay | Admitting: Physical Therapy

## 2019-01-23 ENCOUNTER — Ambulatory Visit: Payer: Medicare HMO | Attending: Adult Health | Admitting: Physical Therapy

## 2019-01-23 DIAGNOSIS — M546 Pain in thoracic spine: Secondary | ICD-10-CM | POA: Diagnosis not present

## 2019-01-23 DIAGNOSIS — R293 Abnormal posture: Secondary | ICD-10-CM | POA: Diagnosis not present

## 2019-01-23 NOTE — Telephone Encounter (Signed)
Please advise.  T. Nelson, CMA 

## 2019-01-23 NOTE — Therapy (Signed)
[place], [place], [address] Phone: [phone]   Fax:  [phone]  Physical Therapy Treatment  Patient Details  Name: Andrea Brooks MRN: [id number] Date of Birth: [birth year] Referring Provider (PT): Mina Marble, NP   Encounter Date: 01/23/2019  PT End of Session - 01/23/19 1104    Visit Number  2    Number of Visits  12    Date for PT Re-Evaluation  02/27/19    Authorization Type  [employer]na Medicare    PT Start Time  1100    PT Stop Time  1145    PT Time Calculation (min)  45 min       Past Medical History:  Diagnosis Date  . Anxiety   . Colon polyps   . Diabetes mellitus without complication ([place])   . Diverticulosis   . Food poisoning   . Gallstones   . GERD (gastroesophageal reflux disease)   . Hyperlipidemia   . Hypertension   . Obesity     Past Surgical History:  Procedure Laterality Date  . ABDOMINAL HYSTERECTOMY     total  . BREAST BIOPSY Left    neg  . CHOLECYSTECTOMY    . JOINT REPLACEMENT Bilateral    knee  . LEFT HEART CATH AND CORONARY ANGIOGRAPHY N/A 07/04/2018   Procedure: LEFT HEART CATH AND CORONARY ANGIOGRAPHY;  Surgeon: Jettie Booze, MD;  Location: [place] [facility];  Service: Cardiovascular;  Laterality: N/A;  . REPLACEMENT TOTAL KNEE BILATERAL      There were no vitals filed for this visit.                    [facility] Adult PT Treatment/Exercise - 01/23/19 0001      Exercises   Exercises  Shoulder      Shoulder Exercises: Supine   Horizontal ABduction  Strengthening;15 reps;Theraband    Theraband Level (Shoulder Horizontal ABduction)  Level 2 (Red)    External Rotation  Strengthening;15 reps;Theraband    Theraband Level (Shoulder External Rotation)  Level 2 (Red)      Shoulder Exercises: Seated   Other Seated Exercises  Sit-stand -hands on thighs       Shoulder Exercises: Standing   Extension  15 reps    Theraband Level (Shoulder  Extension)  Level 3 (Green)    Row  15 reps    Theraband Level (Shoulder Row)  Level 3 (Green)      Shoulder Exercises: ROM/Strengthening   Other ROM/Strengthening Exercises  open books x 5 each way with tactile cues to maintain lower body on side       Shoulder Exercises: Stretch   Corner Stretch Limitations  doorway and supine T stretch wuth UE     Cross Chest Stretch  10 seconds;5 reps               PT Short Term Goals - 01/16/19 1319      PT SHORT TERM GOAL #1   Title  Independent with HEP    Baseline  needs HEP    Time  3    Period  Weeks    Status  New      PT SHORT TERM GOAL #2   Title  Return postural demos to decrease thoracic pain and improve walking tolerance    Baseline  needs cues/has difficulty    Time  3    Period  Weeks    Status  New  PT Long Term Goals - 01/16/19 1320      PT LONG TERM GOAL #1   Title  Improve FOTO outcome measure score to 46% or less impairment    Baseline  60% limited    Time  6    Period  Weeks    Status  New    Target Date  02/27/19      PT LONG TERM GOAL #2   Title  Tolerate ambulation for grocery shopping periods 30-40 min with thoracic pain 2/10 or less    Baseline  difficulty/unable, pain 5/10    Time  6    Period  Weeks    Status  New    Target Date  02/27/19      PT LONG TERM GOAL #3   Title  Increase bilat. shoulder flexion AROM 10-20 deg to improve ability to put dishes away in overhead cabinets    Baseline  Rt. 120, Lt. 130    Time  6    Period  Weeks    Status  New    Target Date  02/27/19      PT LONG TERM GOAL #4   Title  Independent with advanced HEP for independent therapy progression    Time  6    Period  Weeks    Status  New    Target Date  02/27/19            Plan - 01/23/19 1150    Clinical Impression Statement  Pt reports new onset right buttock pain over last 2 days , improved with NSAIDs OTC. She reports compliance with HEP. Reviewed HEP and progressed per POC to  sit-stands and pec stretching, updated HEP. Increased soreness. Advised her to hold off on HEP for rest of day and see how she feels tomorrow prior to resuming. Advised she should excpect soreness.     PT Next Visit Plan  Review HEP as needed, NUSTEP warm up, postural strengthening, add cervical retractions and pec stretch as well as sit<>stand/box squat from table as tolerated, STM if needed thoracolumbar paraspinals, heat prn    PT Home Exercise Plan  seated thoracic extension, Theraband row/scapular retraction, Theraband ext, supine Theraband horiz. abduction, open books from side, sit-stand, pec stretch in doorway     Consulted and Agree with Plan of Care  Patient       Patient will benefit from skilled therapeutic intervention in order to improve the following deficits and impairments:  Pain, Improper body mechanics, Postural dysfunction, Difficulty walking, Decreased activity tolerance, Decreased endurance, Decreased strength  Visit Diagnosis: Pain in thoracic spine  Abnormal posture     Problem List Patient Active Problem List   Diagnosis Date Noted  . Chronic thoracic back pain 01/03/2019  . Acute congestive heart failure ([place])   . Non-ST elevation (NSTEMI) myocardial infarction ([place])   . Acute diastolic (congestive) heart failure ([place]) 07/02/2018  . Elevated troponin 07/02/2018  . Diabetes mellitus without complication ([place]) 97/67/3419  . Diverticulitis-  mild symptoms 03/30/2018  . Irritable bowel syndrome with both constipation and diarrhea 03/30/2018  . Healthcare maintenance 03/17/2018  . Pain of right heel 07/06/2017  . Loose stools 06/24/2017  . Right-sided thoracic back pain 06/24/2017  . Hyperlipidemia 04/22/2017  . GERD (gastroesophageal reflux disease) 04/22/2017  . Impaired glucose metabolism 04/22/2017  . Essential hypertension 04/22/2017  . Anxiety 04/22/2017    Dorene Ar, PTA 01/23/2019, 11:54 AM  [facility]  [place], [place], [address] Phone: [phone]   Fax:  [phone]  Name: Andrea Brooks MRN: [id number] Date of Birth: September 18, 1936

## 2019-01-24 NOTE — Telephone Encounter (Signed)
Good Morning [name], Can you please call and ask [name] and if she is agreeable to once weekly Vytorin Thanks! [name]

## 2019-01-25 ENCOUNTER — Ambulatory Visit: Payer: Medicare HMO | Admitting: Physical Therapy

## 2019-01-25 DIAGNOSIS — M546 Pain in thoracic spine: Secondary | ICD-10-CM | POA: Diagnosis not present

## 2019-01-25 DIAGNOSIS — R293 Abnormal posture: Secondary | ICD-10-CM

## 2019-01-25 NOTE — Telephone Encounter (Signed)
Spoke with pt who has concerns that she is now taking carvedilol and has starting experiencing fatigue and pain with bending over.  She is concerned that this medication may be reacting with her Vytorin.  Please advise.  Charyl Bigger, CMA

## 2019-01-25 NOTE — Therapy (Signed)
[place], [place], [address] Phone: [phone]   Fax:  [phone]  Physical Therapy Treatment  Patient Details  Name: Andrea Brooks MRN: [id number] Date of Birth: Sep 15, 1936 Referring Provider (PT): Mina Marble, NP   Encounter Date: 01/25/2019  PT End of Session - 01/25/19 1123    Visit Number  3    Number of Visits  12    Date for PT Re-Evaluation  02/27/19    Authorization Type  [employer]na Medicare    PT Start Time  2811    PT Stop Time  1137    PT Time Calculation (min)  48 min    Activity Tolerance  Patient tolerated treatment well    Behavior During Therapy  [facility] for tasks assessed/performed       Past Medical History:  Diagnosis Date  . Anxiety   . Colon polyps   . Diabetes mellitus without complication ([place])   . Diverticulosis   . Food poisoning   . Gallstones   . GERD (gastroesophageal reflux disease)   . Hyperlipidemia   . Hypertension   . Obesity     Past Surgical History:  Procedure Laterality Date  . ABDOMINAL HYSTERECTOMY     total  . BREAST BIOPSY Left    neg  . CHOLECYSTECTOMY    . JOINT REPLACEMENT Bilateral    knee  . LEFT HEART CATH AND CORONARY ANGIOGRAPHY N/A 07/04/2018   Procedure: LEFT HEART CATH AND CORONARY ANGIOGRAPHY;  Surgeon: Jettie Booze, MD;  Location: [facility];  Service: Cardiovascular;  Laterality: N/A;  . REPLACEMENT TOTAL KNEE BILATERAL      There were no vitals filed for this visit.  Subjective Assessment - 01/25/19 1052    Subjective  No upper back pain pre-tx. Having fatigue issues with walking prolonged distances but short distances now "tolerable" with back pain. Pt. continues to have some pain from lumnar region into right buttock, intermittently into right lower leg down to ankle mostly noted with supine positioning/sleep disturbance.    Currently in Pain?  Yes    Pain Score  3     Pain Location  Back    Pain Orientation   Lower;Right    Pain Descriptors / Indicators  Dull;Aching    Pain Type  Acute pain    Pain Radiating Towards  right buttock    Pain Onset  1 to 4 weeks ago    Pain Frequency  Intermittent    Aggravating Factors   supine positioning    Pain Relieving Factors  medication    Effect of Pain on Daily Activities  causes sleep disturbance         [facility] PT Assessment - 01/25/19 0001      AROM   Right Shoulder Flexion  140 Degrees    Left Shoulder Flexion  140 Degrees                   [facility] Adult PT Treatment/Exercise - 01/25/19 0001      Exercises   Exercises  Shoulder;Lumbar      Lumbar Exercises: Stretches   Single Knee to Chest Stretch  Right;Left;3 reps;20 seconds    Single Knee to Chest Stretch Limitations  for thoracolumbar stretch    Double Knee to Chest Stretch  --   x10 with 55 cm ball 5-10 sec holds for thoracolumbar stretch   Piriformis Stretch  Right;3 reps;30 seconds      Shoulder Exercises: Supine  Horizontal ABduction  Strengthening;Both;15 reps    Theraband Level (Shoulder Horizontal ABduction)  Level 2 (Red)    External Rotation  Strengthening;Both;15 reps    Theraband Level (Shoulder External Rotation)  Level 2 (Red)    Flexion  --   wand AAROM in hooklying for thoracic extension x 10     Shoulder Exercises: Standing   Extension  Strengthening;Both;15 reps    Theraband Level (Shoulder Extension)  Level 3 (Green)    Row  Both;15 reps    Theraband Level (Shoulder Row)  Level 4 (Blue)      Manual Therapy   Manual Therapy  Soft tissue mobilization;Muscle Energy Technique    Soft tissue mobilization  right piriformis incl. IASTM with foam roll    Muscle Energy Technique  "shotgun" MET 5 sec x 5 ea. and right hamstring, left hip flexor isometrics 5 sec x 5 ea.             PT Education - 01/25/19 1130    Education Details  POC, potential symptom etiology for buttock/leg pain and recommendation to see MD for assessment of this if symptoms  persist    Person(s) Educated  Patient    Methods  Explanation    Comprehension  Verbalized understanding       PT Short Term Goals - 01/25/19 1127      PT SHORT TERM GOAL #1   Title  Independent with HEP    Baseline  met for initial HEP, will update prn    Time  3    Period  Weeks    Status  Achieved      PT SHORT TERM GOAL #2   Title  Return postural demos to decrease thoracic pain and improve walking tolerance    Baseline  needs cues/has difficulty    Time  3    Period  Weeks    Status  On-going        PT Long Term Goals - 01/25/19 1128      PT LONG TERM GOAL #1   Title  Improve FOTO outcome measure score to 46% or less impairment    Baseline  60% limited at eval, not retested today    Time  6    Period  Weeks    Status  On-going      PT LONG TERM GOAL #2   Title  Tolerate ambulation for grocery shopping periods 30-40 min with thoracic pain 2/10 or less    Baseline  progressing but has difficulty  more prolonged periods    Time  6    Period  Weeks    Status  On-going      PT LONG TERM GOAL #3   Title  Increase bilat. shoulder flexion AROM 10-20 deg to improve ability to put dishes away in overhead cabinets    Baseline  met, 140 deg bilat.    Time  6    Period  Weeks    Status  Achieved      PT LONG TERM GOAL #4   Title  Independent with advanced HEP for independent therapy progression    Baseline  ongoing    Time  6    Period  Weeks    Status  On-going            Plan - 01/25/19 1125    Clinical Impression Statement  Mild improvement of thoracic pain from baseline. Some concern for lumbar radiculopathy re: c/o right buttock pain with radiating intermittently  to right ankle so if these symptoms persist recommend follow up with MD to assess. Brief METs added today due to right anterior innominate rotation noted which suspect could be contriburing to back pain symptoms.    Stability/Clinical Decision Making  Evolving/Moderate complexity   due to right  buttock/right RLE pain   Clinical Decision Making  Moderate    Rehab Potential  Good    PT Frequency  2x / week    PT Duration  6 weeks    PT Treatment/Interventions  ADLs/Self Care Home Management;Moist Heat;Cryotherapy;Ultrasound;Electrical Stimulation;Gait training;Functional mobility training;Manual techniques;Dry needling;Patient/family education;Therapeutic activities;Therapeutic exercise;Neuromuscular re-education;Taping    PT Next Visit Plan  Monitor pain in right buttock region/right leg otherwise continue plan of care    PT Home Exercise Plan  seated thoracic extension, Theraband row/scapular retraction, Theraband ext, supine Theraband horiz. abduction, open books from side, sit-stand, pec stretch in doorway     Consulted and Agree with Plan of Care  Patient       Patient will benefit from skilled therapeutic intervention in order to improve the following deficits and impairments:  Pain, Improper body mechanics, Postural dysfunction, Difficulty walking, Decreased activity tolerance, Decreased endurance, Decreased strength  Visit Diagnosis: Pain in thoracic spine  Abnormal posture     Problem List Patient Active Problem List   Diagnosis Date Noted  . Chronic thoracic back pain 01/03/2019  . Acute congestive heart failure ([place])   . Non-ST elevation (NSTEMI) myocardial infarction ([place])   . Acute diastolic (congestive) heart failure ([place]) 07/02/2018  . Elevated troponin 07/02/2018  . Diabetes mellitus without complication ([place]) 54/86/2824  . Diverticulitis-  mild symptoms 03/30/2018  . Irritable bowel syndrome with both constipation and diarrhea 03/30/2018  . Healthcare maintenance 03/17/2018  . Pain of right heel 07/06/2017  . Loose stools 06/24/2017  . Right-sided thoracic back pain 06/24/2017  . Hyperlipidemia 04/22/2017  . GERD (gastroesophageal reflux disease) 04/22/2017  . Impaired glucose metabolism 04/22/2017  . Essential hypertension 04/22/2017  . Anxiety  04/22/2017   Beaulah Dinning, PT, DPT 01/25/19 11:38 AM  [facility] [address] [place], [place], [address] Phone: [phone]   Fax:  [phone]  Name: Moneisha Vosler MRN: [id number] Date of Birth: 1936/09/27

## 2019-01-25 NOTE — Telephone Encounter (Signed)
LVM for pt to call to discuss.  T. Cristy Colmenares, CMA  

## 2019-01-26 NOTE — Telephone Encounter (Signed)
Left message with pt's spouse for pt to return call.  Charyl Bigger, CMA

## 2019-01-26 NOTE — Telephone Encounter (Signed)
Good Morning [name], I have read several messages between Ms. Corigliano and her cardiologist with the same complaint. She has been on Carvedilol >6 months and she needs to be on BB for cardiac protection and he does not believe that it is causes her sx's. Confirm how she is taking the Vytorin- QD or once weekly I placed a PT referral for her, has she started? I think would be very beneficial to treat the sx's that she is describing If she would like to discuss further please have her make an OV Thanks! [name]

## 2019-01-27 NOTE — Telephone Encounter (Signed)
Spoke with pt who states that she has had 3 sessions this week with PT and has not experienced any relief yet.  Offered pt an appointment to further discuss with [facility].  Pt states that she would like to do this.  Pt transferred to front desk to schedule appt.  Charyl Bigger, CMA

## 2019-01-30 ENCOUNTER — Encounter: Payer: Self-pay | Admitting: Physical Therapy

## 2019-01-30 ENCOUNTER — Ambulatory Visit: Payer: Medicare HMO | Admitting: Physical Therapy

## 2019-01-30 VITALS — BP 148/84 | HR 64

## 2019-01-30 DIAGNOSIS — R293 Abnormal posture: Secondary | ICD-10-CM

## 2019-01-30 DIAGNOSIS — M546 Pain in thoracic spine: Secondary | ICD-10-CM

## 2019-01-30 NOTE — Therapy (Signed)
[place], [place], [address] Phone: [phone]   Fax:  [phone]  Physical Therapy Treatment  Patient Details  Name: Andrea Brooks MRN: [id number] Date of Birth: 08/19/1936 Referring Provider (PT): Mina Marble, NP   Encounter Date: 01/30/2019  PT End of Session - 01/30/19 1248    Visit Number  5    Number of Visits  12    Date for PT Re-Evaluation  02/27/19    Authorization Type  Aetna Medicare    PT Start Time  1050    PT Stop Time  1138    PT Time Calculation (min)  48 min    Activity Tolerance  Patient tolerated treatment well    Behavior During Therapy  [facility] for tasks assessed/performed       Past Medical History:  Diagnosis Date  . Anxiety   . Colon polyps   . Diabetes mellitus without complication ([place])   . Diverticulosis   . Food poisoning   . Gallstones   . GERD (gastroesophageal reflux disease)   . Hyperlipidemia   . Hypertension   . Obesity     Past Surgical History:  Procedure Laterality Date  . ABDOMINAL HYSTERECTOMY     total  . BREAST BIOPSY Left    neg  . CHOLECYSTECTOMY    . JOINT REPLACEMENT Bilateral    knee  . LEFT HEART CATH AND CORONARY ANGIOGRAPHY N/A 07/04/2018   Procedure: LEFT HEART CATH AND CORONARY ANGIOGRAPHY;  Surgeon: Jettie Booze, MD;  Location: [facility];  Service: Cardiovascular;  Laterality: N/A;  . REPLACEMENT TOTAL KNEE BILATERAL      Vitals:   01/30/19 1053  BP: (!) 148/84  Pulse: 64  SpO2: 96%    Subjective Assessment - 01/30/19 1053    Subjective  Pt. reports pain into right buttock and leg has improved since last visit, only about once per week now. She reports tolerance for standing and walking has improved but still c/o difficulty maintaining upright posture. Pt. reports for the past 1-2 months she has had some soreness with "breathing"/walking in chest region.  She reports this has been present but did not mention at  previous visits, she reports mentioned to her PCP but is unclear when. See plan/assessment.    Pertinent History  anxiety, diabetes, hospitalizations 8/19, 10/19, IBS    Patient Stated Goals  "Be erect and walk"    Currently in Pain?  Yes    Pain Score  3     Pain Location  Chest    Pain Orientation  Anterior    Pain Descriptors / Indicators  Dull    Pain Onset  More than a month ago   pt. reports has been having for 1-2 months   Aggravating Factors   walking                       [facility] Adult PT Treatment/Exercise - 01/30/19 0001      Lumbar Exercises: Stretches   Double Knee to Chest Stretch  --   x 15 reps AAROM with 65 cm P-ball   Double Knee to Chest Stretch Limitations  for thoracolumbar stretch      Lumbar Exercises: Aerobic   Nustep  L2 x 5 min UE/LE      Lumbar Exercises: Standing   Other Standing Lumbar Exercises   hip hinge flexion to gluts touching all in standing x 10, "mini"dead lift for 10 lb.  KB pick up from stool (started with low table but switched to stool)    Other Standing Lumbar Exercises  attempted "hip hinge" with stick behind back but unable due to limited shoulder ROM      Lumbar Exercises: Seated   Sit to Stand  10 reps      Shoulder Exercises: Standing   Extension  Strengthening;Both;20 reps    Theraband Level (Shoulder Extension)  Level 3 (Green)    Extension Limitations  cues for hand position and angle ROM    Row  Both;20 reps    Theraband Level (Shoulder Row)  Level 4 (Blue)      Shoulder Exercises: Music therapist Stretch  3 reps;20 seconds    Other Shoulder Stretches  seated thoracic ext in chair with right foot on stool, therapist assist upper body x 10 reps      Manual Therapy   Manual Therapy  Joint mobilization    Joint Mobilization  Gentle thoracolumba mobilization in left sidelying, gentle LAD right hip in supine for lumbopelvic stretch             PT Education - 01/30/19 1247    Education Details  exercise  form, POC, improvements, alert PCP/follow up as needed ongoing chest pain symptoms    Person(s) Educated  Patient    Methods  Explanation;Demonstration;Tactile cues;Verbal cues    Comprehension  Verbalized understanding;Returned demonstration       PT Short Term Goals - 01/25/19 1127      PT SHORT TERM GOAL #1   Title  Independent with HEP    Baseline  met for initial HEP, will update prn    Time  3    Period  Weeks    Status  Achieved      PT SHORT TERM GOAL #2   Title  Return postural demos to decrease thoracic pain and improve walking tolerance    Baseline  needs cues/has difficulty    Time  3    Period  Weeks    Status  On-going        PT Long Term Goals - 01/25/19 1128      PT LONG TERM GOAL #1   Title  Improve FOTO outcome measure score to 46% or less impairment    Baseline  60% limited at eval, not retested today    Time  6    Period  Weeks    Status  On-going      PT LONG TERM GOAL #2   Title  Tolerate ambulation for grocery shopping periods 30-40 min with thoracic pain 2/10 or less    Baseline  progressing but has difficulty  more prolonged periods    Time  6    Period  Weeks    Status  On-going      PT LONG TERM GOAL #3   Title  Increase bilat. shoulder flexion AROM 10-20 deg to improve ability to put dishes away in overhead cabinets    Baseline  met, 140 deg bilat.    Time  6    Period  Weeks    Status  Achieved      PT LONG TERM GOAL #4   Title  Independent with advanced HEP for independent therapy progression    Baseline  ongoing    Time  6    Period  Weeks    Status  On-going            Plan - 01/30/19 1249  Clinical Impression Statement  Pt. had not previously alerted to chest pain symptoms/reports 1-2 month history and thinks she informed PCP (message sent today to alert). BP mildly elevated otherwise vitals within normal limits and no exacerbation of symptoms with session today. Pt. improving in terms of functional status for  positional tolerance and decreased pain. Expect progress to address postural strength and stability will be ongoing/take more time.    Stability/Clinical Decision Making  Evolving/Moderate complexity    Clinical Decision Making  Moderate    Rehab Potential  Good    PT Frequency  2x / week    PT Duration  6 weeks    PT Treatment/Interventions  ADLs/Self Care Home Management;Moist Heat;Cryotherapy;Ultrasound;Electrical Stimulation;Gait training;Functional mobility training;Manual techniques;Dry needling;Patient/family education;Therapeutic activities;Therapeutic exercise;Neuromuscular re-education;Taping    PT Next Visit Plan  Monitor chest pain and pain in right buttock region/right leg otherwise continue plan of care, check vitals as needed    PT Home Exercise Plan  seated thoracic extension, Theraband row/scapular retraction, Theraband ext, supine Theraband horiz. abduction, open books from side, sit-stand, pec stretch in doorway     Consulted and Agree with Plan of Care  Patient       Patient will benefit from skilled therapeutic intervention in order to improve the following deficits and impairments:  Pain, Improper body mechanics, Postural dysfunction, Difficulty walking, Decreased activity tolerance, Decreased endurance, Decreased strength  Visit Diagnosis: Pain in thoracic spine  Abnormal posture     Problem List Patient Active Problem List   Diagnosis Date Noted  . Chronic thoracic back pain 01/03/2019  . Acute congestive heart failure ([place])   . Non-ST elevation (NSTEMI) myocardial infarction ([place])   . Acute diastolic (congestive) heart failure ([place]) 07/02/2018  . Elevated troponin 07/02/2018  . Diabetes mellitus without complication ([place]) 47/18/5501  . Diverticulitis-  mild symptoms 03/30/2018  . Irritable bowel syndrome with both constipation and diarrhea 03/30/2018  . Healthcare maintenance 03/17/2018  . Pain of right heel 07/06/2017  . Loose stools 06/24/2017  .  Right-sided thoracic back pain 06/24/2017  . Hyperlipidemia 04/22/2017  . GERD (gastroesophageal reflux disease) 04/22/2017  . Impaired glucose metabolism 04/22/2017  . Essential hypertension 04/22/2017  . Anxiety 04/22/2017    Beaulah Dinning, PT, DPT 01/30/19 12:56 PM  [facility] [address] [place], [place], [address] Phone: [phone]   Fax:  [phone]  Name: Jailen Lung MRN: [id number] Date of Birth: 06/16/1936

## 2019-02-01 ENCOUNTER — Encounter: Payer: Self-pay | Admitting: Physical Therapy

## 2019-02-01 ENCOUNTER — Other Ambulatory Visit: Payer: Self-pay

## 2019-02-01 ENCOUNTER — Ambulatory Visit: Payer: Medicare HMO | Admitting: Physical Therapy

## 2019-02-01 DIAGNOSIS — M546 Pain in thoracic spine: Secondary | ICD-10-CM | POA: Diagnosis not present

## 2019-02-01 DIAGNOSIS — R293 Abnormal posture: Secondary | ICD-10-CM | POA: Diagnosis not present

## 2019-02-01 NOTE — Therapy (Signed)
[place], [place], [address] Phone: [phone]   Fax:  [phone]  Physical Therapy Treatment  Patient Details  Name: Andrea Brooks MRN: [id number] Date of Birth: Mar 18, 1936 Referring Provider (PT): Mina Marble, NP   Encounter Date: 02/01/2019  PT End of Session - 02/01/19 1132    Visit Number  6    Number of Visits  12    Date for PT Re-Evaluation  02/27/19    Authorization Type  Aetna Medicare    PT Start Time  1100    PT Stop Time  1145    PT Time Calculation (min)  45 min       Past Medical History:  Diagnosis Date  . Anxiety   . Colon polyps   . Diabetes mellitus without complication ([place])   . Diverticulosis   . Food poisoning   . Gallstones   . GERD (gastroesophageal reflux disease)   . Hyperlipidemia   . Hypertension   . Obesity     Past Surgical History:  Procedure Laterality Date  . ABDOMINAL HYSTERECTOMY     total  . BREAST BIOPSY Left    neg  . CHOLECYSTECTOMY    . JOINT REPLACEMENT Bilateral    knee  . LEFT HEART CATH AND CORONARY ANGIOGRAPHY N/A 07/04/2018   Procedure: LEFT HEART CATH AND CORONARY ANGIOGRAPHY;  Surgeon: Jettie Booze, MD;  Location: [facility];  Service: Cardiovascular;  Laterality: N/A;  . REPLACEMENT TOTAL KNEE BILATERAL      There were no vitals filed for this visit.  Subjective Assessment - 02/01/19 1108    Subjective  A little sore. I cannot seem to straighten up. I was sore in hamstrings and lower back after last visit.     Currently in Pain?  Yes    Pain Score  3     Pain Location  Back    Pain Orientation  Lower    Pain Descriptors / Indicators  Sore                       [facility] Adult PT Treatment/Exercise - 02/01/19 0001      Lumbar Exercises: Stretches   Hip Flexor Stretch  60 seconds    Hip Flexor Stretch Limitations  with massage roller       Lumbar Exercises: Aerobic   Nustep  L4 x 5 min UE/LE       Lumbar Exercises: Standing   Other Standing Lumbar Exercises  hip hinge to wall touch- cues to keep shoulders aligned with spine- much difficulty with this. attempted 10# kettle bell squats however she could not maintain good form       Shoulder Exercises: Standing   Row  Both;20 reps    Theraband Level (Shoulder Row)  Level 4 (Blue)      Shoulder Exercises: Stretch   Corner Stretch  3 reps;20 seconds               PT Short Term Goals - 01/25/19 1127      PT SHORT TERM GOAL #1   Title  Independent with HEP    Baseline  met for initial HEP, will update prn    Time  3    Period  Weeks    Status  Achieved      PT SHORT TERM GOAL #2   Title  Return postural demos to decrease thoracic pain and improve walking tolerance  Baseline  needs cues/has difficulty    Time  3    Period  Weeks    Status  On-going        PT Long Term Goals - 01/25/19 1128      PT LONG TERM GOAL #1   Title  Improve FOTO outcome measure score to 46% or less impairment    Baseline  60% limited at eval, not retested today    Time  6    Period  Weeks    Status  On-going      PT LONG TERM GOAL #2   Title  Tolerate ambulation for grocery shopping periods 30-40 min with thoracic pain 2/10 or less    Baseline  progressing but has difficulty  more prolonged periods    Time  6    Period  Weeks    Status  On-going      PT LONG TERM GOAL #3   Title  Increase bilat. shoulder flexion AROM 10-20 deg to improve ability to put dishes away in overhead cabinets    Baseline  met, 140 deg bilat.    Time  6    Period  Weeks    Status  Achieved      PT LONG TERM GOAL #4   Title  Independent with advanced HEP for independent therapy progression    Baseline  ongoing    Time  6    Period  Weeks    Status  On-going            Plan - 02/01/19 1210    Clinical Impression Statement  Primary PT received response from patient's MD. She recommended she keep her current appt with cardiologist and go to  ED or call sooner if any acute chest pain symptoms occur. She reports to me she is not having anymore chest pain. She does feel a pulling in chest when she tries to stand up straight. Continued with hip hinge/dead lift mechanics. She needs max cues to engage lats and keep shoulders back with hip hinge. Stretched hip flexors for assist in upright posture.     PT Next Visit Plan  Monitor chest pain and pain in right buttock region/right leg otherwise continue plan of care, check vitals as needed    PT Home Exercise Plan  seated thoracic extension, Theraband row/scapular retraction, Theraband ext, supine Theraband horiz. abduction, open books from side, sit-stand, pec stretch in doorway     Consulted and Agree with Plan of Care  Patient       Patient will benefit from skilled therapeutic intervention in order to improve the following deficits and impairments:  Pain, Improper body mechanics, Postural dysfunction, Difficulty walking, Decreased activity tolerance, Decreased endurance, Decreased strength  Visit Diagnosis: Pain in thoracic spine  Abnormal posture     Problem List Patient Active Problem List   Diagnosis Date Noted  . Chronic thoracic back pain 01/03/2019  . Acute congestive heart failure ([place])   . Non-ST elevation (NSTEMI) myocardial infarction ([place])   . Acute diastolic (congestive) heart failure ([place]) 07/02/2018  . Elevated troponin 07/02/2018  . Diabetes mellitus without complication ([place]) 01/60/1093  . Diverticulitis-  mild symptoms 03/30/2018  . Irritable bowel syndrome with both constipation and diarrhea 03/30/2018  . Healthcare maintenance 03/17/2018  . Pain of right heel 07/06/2017  . Loose stools 06/24/2017  . Right-sided thoracic back pain 06/24/2017  . Hyperlipidemia 04/22/2017  . GERD (gastroesophageal reflux disease) 04/22/2017  . Impaired glucose metabolism 04/22/2017  .  Essential hypertension 04/22/2017  . Anxiety 04/22/2017    Dorene Ar,  PTA 02/01/2019, 12:20 PM  [facility] [address] [place], [place], [address] Phone: [phone]   Fax:  [phone]  Name: Gabryelle Whitmoyer MRN: [id number] Date of Birth: 1935/12/28

## 2019-02-05 NOTE — Progress Notes (Deleted)
Subjective:    Patient ID: Andrea Brooks, female    DOB: [birth year], 83 y.o.   MRN: [id number]  HPI:  Ms. Truluck presents with   Patient Care Team    Relationship Specialty Notifications Start End  Esaw Grandchild, NP PCP - General Family Medicine  04/22/17   Sueanne Margarita, MD PCP - Cardiology Cardiology Admissions 07/04/18     Patient Active Problem List   Diagnosis Date Noted  . Chronic thoracic back pain 01/03/2019  . Acute congestive heart failure ([place])   . Non-ST elevation (NSTEMI) myocardial infarction ([place])   . Acute diastolic (congestive) heart failure ([place]) 07/02/2018  . Elevated troponin 07/02/2018  . Diabetes mellitus without complication ([place]) 05/16/7627  . Diverticulitis-  mild symptoms 03/30/2018  . Irritable bowel syndrome with both constipation and diarrhea 03/30/2018  . Healthcare maintenance 03/17/2018  . Pain of right heel 07/06/2017  . Loose stools 06/24/2017  . Right-sided thoracic back pain 06/24/2017  . Hyperlipidemia 04/22/2017  . GERD (gastroesophageal reflux disease) 04/22/2017  . Impaired glucose metabolism 04/22/2017  . Essential hypertension 04/22/2017  . Anxiety 04/22/2017     Past Medical History:  Diagnosis Date  . Anxiety   . Colon polyps   . Diabetes mellitus without complication ([place])   . Diverticulosis   . Food poisoning   . Gallstones   . GERD (gastroesophageal reflux disease)   . Hyperlipidemia   . Hypertension   . Obesity      Past Surgical History:  Procedure Laterality Date  . ABDOMINAL HYSTERECTOMY     total  . BREAST BIOPSY Left    neg  . CHOLECYSTECTOMY    . JOINT REPLACEMENT Bilateral    knee  . LEFT HEART CATH AND CORONARY ANGIOGRAPHY N/A 07/04/2018   Procedure: LEFT HEART CATH AND CORONARY ANGIOGRAPHY;  Surgeon: Jettie Booze, MD;  Location: [facility];  Service: Cardiovascular;  Laterality: N/A;  . REPLACEMENT TOTAL KNEE BILATERAL       Family History  Problem Relation Age of Onset  .  Hypertension Mother   . Colon cancer Mother   . Stroke Father   . Heart failure Father   . Stroke Sister   . Stroke Brother   . Graves' disease Daughter   . Breast cancer Paternal Aunt   . Graves' disease Sister   . Bone cancer Sister   . Breast cancer Maternal Aunt   . Esophageal cancer Neg Hx   . Rectal cancer Neg Hx   . Liver cancer Neg Hx      Social History   Substance and Sexual Activity  Drug Use No     Social History   Substance and Sexual Activity  Alcohol Use No     Social History   Tobacco Use  Smoking Status Former Smoker  . Packs/day: 1.00  . Years: 2.00  . Pack years: 2.00  . Last attempt to quit: 11/24/1959  . Years since quitting: 59.2  Smokeless Tobacco Never Used     Outpatient Encounter Medications as of 02/09/2019  Medication Sig  . acetaminophen (TYLENOL) 500 MG tablet Take 1,000 mg by mouth every 6 (six) hours as needed for moderate pain.  Marland Kitchen aspirin EC 81 MG tablet Take 81 mg by mouth daily.  . carvedilol (COREG) 3.125 MG tablet Take 3.125 mg by mouth 2 (two) times daily with a meal.  . dicyclomine (BENTYL) 10 MG capsule Take 10 mg by mouth 4 (four) times daily -  before  meals and at bedtime.  Marland Kitchen ezetimibe-simvastatin (VYTORIN) 10-20 MG tablet TAKE 1 TABLET DAILY  . LORazepam (ATIVAN) 1 MG tablet Take 1 tablet (1 mg total) by mouth daily as needed for anxiety.  . metFORMIN (GLUCOPHAGE) 500 MG tablet 1 tab with breakfast and 1 tab with dinner  . omeprazole (PRILOSEC OTC) 20 MG tablet Take 20 mg by mouth daily.  . valsartan (DIOVAN) 160 MG tablet Take 160 mg by mouth daily.   No facility-administered encounter medications on file as of 02/09/2019.     Allergies: Patient has no known allergies.  There is no height or weight on file to calculate BMI.  There were no vitals taken for this visit.     Review of Systems     Objective:   Physical Exam        Assessment & Plan:  No diagnosis found.  No problem-specific Assessment  & Plan notes found for this encounter.    FOLLOW-UP:  No follow-ups on file.

## 2019-02-06 ENCOUNTER — Ambulatory Visit: Payer: Medicare HMO | Admitting: Physical Therapy

## 2019-02-06 ENCOUNTER — Encounter: Payer: Self-pay | Admitting: Physical Therapy

## 2019-02-06 ENCOUNTER — Other Ambulatory Visit: Payer: Self-pay

## 2019-02-06 DIAGNOSIS — R293 Abnormal posture: Secondary | ICD-10-CM | POA: Diagnosis not present

## 2019-02-06 DIAGNOSIS — M546 Pain in thoracic spine: Secondary | ICD-10-CM | POA: Diagnosis not present

## 2019-02-06 NOTE — Therapy (Signed)
[place], [place], [address] Phone: [phone]   Fax:  [phone]  Physical Therapy Treatment  Patient Details  Name: Andrea Brooks MRN: [id number] Date of Birth: 03/30/1936 Referring Provider (PT): Mina Marble, NP   Encounter Date: 02/06/2019  PT End of Session - 02/06/19 1100    Visit Number  7    Number of Visits  12    Date for PT Re-Evaluation  02/27/19    Authorization Type  [employer] Medicare, progress note by visit 10, KX at 15 visits    PT Start Time  1056    PT Stop Time  1139    PT Time Calculation (min)  43 min    Activity Tolerance  Patient tolerated treatment well    Behavior During Therapy  [facility] for tasks assessed/performed       Past Medical History:  Diagnosis Date  . Anxiety   . Colon polyps   . Diabetes mellitus without complication ([place])   . Diverticulosis   . Food poisoning   . Gallstones   . GERD (gastroesophageal reflux disease)   . Hyperlipidemia   . Hypertension   . Obesity     Past Surgical History:  Procedure Laterality Date  . ABDOMINAL HYSTERECTOMY     total  . BREAST BIOPSY Left    neg  . CHOLECYSTECTOMY    . JOINT REPLACEMENT Bilateral    knee  . LEFT HEART CATH AND CORONARY ANGIOGRAPHY N/A 07/04/2018   Procedure: LEFT HEART CATH AND CORONARY ANGIOGRAPHY;  Surgeon: Jettie Booze, MD;  Location: [facility];  Service: Cardiovascular;  Laterality: N/A;  . REPLACEMENT TOTAL KNEE BILATERAL      There were no vitals filed for this visit.  Subjective Assessment - 02/06/19 1057    Subjective  Pt. reports had frustrating weekend due to weakness-notes difficulty lifting laundry basket, difficulty performing chores involving bending. No current c/o chest pain as had been noted last week.    Currently in Pain?  No/denies    Pain Score  0-No pain         [facility] PT Assessment - 02/06/19 0001      AROM   Lumbar Flexion  70    Lumbar Extension  10     Lumbar - Right Side Bend  20    Lumbar - Left Side Bend  20    Lumbar - Right Rotation  50%    Lumbar - Left Rotation  50%                   [facility] Adult PT Treatment/Exercise - 02/06/19 0001      Lumbar Exercises: Stretches   Double Knee to Chest Stretch  --   DKTC AAROM for thoracolumbar stretch x 15 reps    Double Knee to Chest Stretch Limitations  with 55 cm ball      Lumbar Exercises: Aerobic   Nustep  L4 x 5 min UE/LE      Lumbar Exercises: Standing   Other Standing Lumbar Exercises  partial squat at counter 2x10    Other Standing Lumbar Exercises  pick up crate (unweighted at 8 lb. empty weight) from stool with work on Office manager to simulate lifting and carrying Stage manager      Lumbar Exercises: Supine   Pelvic Tilt  15 reps    Pelvic Tilt Limitations  verbal/tactile and visual cues for form    Bent Knee Raise  10 reps  Bent Knee Raise Limitations  cues for pelvic tilt    Bridge  10 reps    Bridge Limitations  legs on bolster    Other Supine Lumbar Exercises  abd. bracing with pelvic tilt/opp. UE raises x 10 ea. bilat. with 2 lb. DB      Shoulder Exercises: Supine   Horizontal ABduction  Strengthening;Both;20 reps   crossing "X" diagonals   Theraband Level (Shoulder Horizontal ABduction)  Level 2 (Red)   verbal/tactile cues for form     Shoulder Exercises: ROM/Strengthening   Cybex Row Limitations  3 sets of 10 with 15 lb. 1st set, 20 lbs. 2nd 2 sets      Shoulder Exercises: Stretch   Corner Stretch Limitations  doorway pec stretch 3x20 sec cues for foot position and proper distancing with body position             PT Education - 02/06/19 1142    Education Details  POC, lifting mechanics, anatomy with muscle involved for lifting/carrying    Person(s) Educated  Patient    Methods  Explanation;Demonstration    Comprehension  Verbalized understanding;Returned demonstration       PT Short Term Goals - 02/06/19 1145      PT SHORT TERM  GOAL #1   Title  Independent with HEP    Baseline  met for initial HEP, will update prn    Time  3    Period  Weeks    Status  Achieved      PT SHORT TERM GOAL #2   Title  Return postural demos to decrease thoracic pain and improve walking tolerance    Baseline  improving but still needes cues    Time  3    Period  Weeks    Status  On-going        PT Long Term Goals - 02/06/19 1146      PT LONG TERM GOAL #1   Title  Improve FOTO outcome measure score to 46% or less impairment    Baseline  60% limited at eval, not retested today    Time  6    Period  Weeks    Status  On-going      PT LONG TERM GOAL #2   Title  Tolerate ambulation for grocery shopping periods 30-40 min with thoracic pain 2/10 or less    Baseline  progressing but has difficulty  more prolonged periods    Time  6    Period  Weeks    Status  On-going      PT LONG TERM GOAL #3   Title  Increase bilat. shoulder flexion AROM 10-20 deg to improve ability to put dishes away in overhead cabinets    Baseline  met, 140 deg bilat.    Time  6    Period  Weeks    Status  Achieved      PT LONG TERM GOAL #4   Title  Independent with advanced HEP for independent therapy progression    Baseline  ongoing    Time  6    Period  Weeks    Status  On-going            Plan - 02/06/19 1143    Clinical Impression Statement  Postural weakness multi-factorial with combination core and posterior chain weakness with upper back weakness and postural kyphosis. Given factors including age and duration of symptoms expect progress will be gradual due to time needed to achieve functional strength gains.  Stability/Clinical Decision Making  Evolving/Moderate complexity    Clinical Decision Making  Moderate    Rehab Potential  Good    PT Frequency  2x / week    PT Duration  6 weeks    PT Treatment/Interventions  ADLs/Self Care Home Management;Moist Heat;Cryotherapy;Ultrasound;Electrical Stimulation;Gait training;Functional  mobility training;Manual techniques;Dry needling;Patient/family education;Therapeutic activities;Therapeutic exercise;Neuromuscular re-education;Taping    PT Next Visit Plan  Monitor chest pain and pain in right buttock region/right leg otherwise continue plan of care, check vitals as needed    PT Home Exercise Plan  seated thoracic extension, Theraband row/scapular retraction, Theraband ext, supine Theraband horiz. abduction, open books from side, sit-stand, pec stretch in doorway     Consulted and Agree with Plan of Care  Patient       Patient will benefit from skilled therapeutic intervention in order to improve the following deficits and impairments:  Pain, Improper body mechanics, Postural dysfunction, Difficulty walking, Decreased activity tolerance, Decreased endurance, Decreased strength  Visit Diagnosis: Pain in thoracic spine  Abnormal posture     Problem List Patient Active Problem List   Diagnosis Date Noted  . Chronic thoracic back pain 01/03/2019  . Acute congestive heart failure ([place])   . Non-ST elevation (NSTEMI) myocardial infarction ([place])   . Acute diastolic (congestive) heart failure ([place]) 07/02/2018  . Elevated troponin 07/02/2018  . Diabetes mellitus without complication ([place]) 24/49/7530  . Diverticulitis-  mild symptoms 03/30/2018  . Irritable bowel syndrome with both constipation and diarrhea 03/30/2018  . Healthcare maintenance 03/17/2018  . Pain of right heel 07/06/2017  . Loose stools 06/24/2017  . Right-sided thoracic back pain 06/24/2017  . Hyperlipidemia 04/22/2017  . GERD (gastroesophageal reflux disease) 04/22/2017  . Impaired glucose metabolism 04/22/2017  . Essential hypertension 04/22/2017  . Anxiety 04/22/2017    Beaulah Dinning, PT, DPT 02/06/19 11:47 AM  [facility] [address] [place], [place], [address] Phone: [phone]   Fax:  [phone]  Name: Andrea Brooks MRN:  [id number] Date of Birth: 1936-09-01

## 2019-02-08 ENCOUNTER — Telehealth: Payer: Self-pay | Admitting: Physical Therapy

## 2019-02-08 ENCOUNTER — Ambulatory Visit: Payer: Medicare HMO | Admitting: Physical Therapy

## 2019-02-08 NOTE — Telephone Encounter (Signed)
Called patient to check on status regarding today's therapy appointment (did not show for 11 AM appointment but reports had called to cancel). Pt. Wishing to hold off on therapy at this time due to health concerns regarding coronavirus and her higher risk status for this. Further therapy visits cancelled and patient will continue with home exercises.

## 2019-02-08 NOTE — Therapy (Signed)
[facility] [address] [place], [place], [address] Phone: [phone]   Fax:  [phone]  Physical Therapy Treatment/Discharge  Patient Details  Name: Andrea Brooks MRN: [id number] Date of Birth: 10/06/1936 Referring Provider (PT): Katy Danford, NP   Encounter Date: 02/06/2019    Past Medical History:  Diagnosis Date  . Anxiety   . Colon polyps   . Diabetes mellitus without complication ([facility])   . Diverticulosis   . Food poisoning   . Gallstones   . GERD (gastroesophageal reflux disease)   . Hyperlipidemia   . Hypertension   . Obesity     Past Surgical History:  Procedure Laterality Date  . ABDOMINAL HYSTERECTOMY     total  . BREAST BIOPSY Left    neg  . CHOLECYSTECTOMY    . JOINT REPLACEMENT Bilateral    knee  . LEFT HEART CATH AND CORONARY ANGIOGRAPHY N/A 07/04/2018   Procedure: LEFT HEART CATH AND CORONARY ANGIOGRAPHY;  Surgeon: Varanasi, Jayadeep S, MD;  Location: [facility];  Service: Cardiovascular;  Laterality: N/A;  . REPLACEMENT TOTAL KNEE BILATERAL      There were no vitals filed for this visit.                              PT Short Term Goals - 02/06/19 1145      PT SHORT TERM GOAL #1   Title  Independent with HEP    Baseline  met for initial HEP, will update prn    Time  3    Period  Weeks    Status  Achieved      PT SHORT TERM GOAL #2   Title  Return postural demos to decrease thoracic pain and improve walking tolerance    Baseline  improving but still needes cues    Time  3    Period  Weeks    Status  On-going        PT Long Term Goals - 02/06/19 1146      PT LONG TERM GOAL #1   Title  Improve FOTO outcome measure score to 46% or less impairment    Baseline  60% limited at eval, not retested today    Time  6    Period  Weeks    Status  On-going      PT LONG TERM GOAL #2   Title  Tolerate ambulation for grocery shopping periods 30-40 min with  thoracic pain 2/10 or less    Baseline  progressing but has difficulty  more prolonged periods    Time  6    Period  Weeks    Status  On-going      PT LONG TERM GOAL #3   Title  Increase bilat. shoulder flexion AROM 10-20 deg to improve ability to put dishes away in overhead cabinets    Baseline  met, 140 deg bilat.    Time  6    Period  Weeks    Status  Achieved      PT LONG TERM GOAL #4   Title  Independent with advanced HEP for independent therapy progression    Baseline  ongoing    Time  6    Period  Weeks    Status  On-going              Patient will benefit from skilled therapeutic intervention in order to improve the following deficits and   impairments:  Pain, Improper body mechanics, Postural dysfunction, Difficulty walking, Decreased activity tolerance, Decreased endurance, Decreased strength  Visit Diagnosis: Pain in thoracic spine  Abnormal posture     Problem List Patient Active Problem List   Diagnosis Date Noted  . Chronic thoracic back pain 01/03/2019  . Acute congestive heart failure ([place])   . Non-ST elevation (NSTEMI) myocardial infarction ([place])   . Acute diastolic (congestive) heart failure ([place]) 07/02/2018  . Elevated troponin 07/02/2018  . Diabetes mellitus without complication ([place]) 45/85/9292  . Diverticulitis-  mild symptoms 03/30/2018  . Irritable bowel syndrome with both constipation and diarrhea 03/30/2018  . Healthcare maintenance 03/17/2018  . Pain of right heel 07/06/2017  . Loose stools 06/24/2017  . Right-sided thoracic back pain 06/24/2017  . Hyperlipidemia 04/22/2017  . GERD (gastroesophageal reflux disease) 04/22/2017  . Impaired glucose metabolism 04/22/2017  . Essential hypertension 04/22/2017  . Anxiety 04/22/2017      PHYSICAL THERAPY DISCHARGE SUMMARY  Visits from Start of Care: 7  Current functional level related to goals / functional outcomes: Patient requests discharge/wishes to cancel remaining therapy  visits due to concerns regarding coronavirus. She will continue with her home exercises. Remaining deficits: Postural + core weakness   Education / Equipment: POC  Plan: Patient agrees to discharge.  Patient goals were partially met. Patient is being discharged due to the patient's request.  ?????           Beaulah Dinning, PT, DPT 02/08/19 11:32 AM   [facility] [address] [place], [place], [address] Phone: [phone]   Fax:  [phone]  Name: Andrea Brooks MRN: [id number] Date of Birth: May 11, 1936

## 2019-02-09 ENCOUNTER — Ambulatory Visit: Payer: Medicare HMO | Admitting: Adult Health

## 2019-02-13 ENCOUNTER — Ambulatory Visit: Payer: Medicare HMO | Admitting: Physical Therapy

## 2019-02-15 ENCOUNTER — Ambulatory Visit: Payer: Medicare HMO | Admitting: Physical Therapy

## 2019-02-15 ENCOUNTER — Encounter: Payer: Self-pay | Admitting: *Deleted

## 2019-02-16 ENCOUNTER — Other Ambulatory Visit: Payer: Self-pay

## 2019-02-16 ENCOUNTER — Ambulatory Visit (INDEPENDENT_AMBULATORY_CARE_PROVIDER_SITE_OTHER): Payer: Medicare HMO | Admitting: Adult Health

## 2019-02-16 DIAGNOSIS — F419 Anxiety disorder, unspecified: Secondary | ICD-10-CM | POA: Diagnosis not present

## 2019-02-16 DIAGNOSIS — R69 Illness, unspecified: Secondary | ICD-10-CM | POA: Diagnosis not present

## 2019-02-16 DIAGNOSIS — E119 Type 2 diabetes mellitus without complications: Secondary | ICD-10-CM

## 2019-02-16 DIAGNOSIS — K582 Mixed irritable bowel syndrome: Secondary | ICD-10-CM | POA: Diagnosis not present

## 2019-02-16 DIAGNOSIS — I10 Essential (primary) hypertension: Secondary | ICD-10-CM | POA: Diagnosis not present

## 2019-02-16 DIAGNOSIS — G8929 Other chronic pain: Secondary | ICD-10-CM | POA: Diagnosis not present

## 2019-02-16 DIAGNOSIS — M546 Pain in thoracic spine: Secondary | ICD-10-CM | POA: Diagnosis not present

## 2019-02-16 MED ORDER — LORAZEPAM 1 MG PO TABS: 1.0000 mg | ORAL_TABLET | Freq: Every day | ORAL | 0 refills | Status: DC | PRN

## 2019-02-16 MED ORDER — METOCLOPRAMIDE HCL 5 MG PO TABS: 5.0000 mg | ORAL_TABLET | Freq: Three times a day (TID) | ORAL | 0 refills | Status: DC | PRN

## 2019-02-16 NOTE — Addendum Note (Signed)
Addended by: Mina Marble D on: 02/16/2019 11:56 AM   Modules accepted: Level of Service

## 2019-02-16 NOTE — Assessment & Plan Note (Signed)
AM BG 111 Denies episodes of hypoglycemia Continue Metformin 500mg  BID

## 2019-02-16 NOTE — Assessment & Plan Note (Signed)
She was going to PT twice weekly, she completed three weeks therapy Per pt- therapy consisted of lunges, band stretches for upper body, stationary bike riding, message, rolling Last PT appt was 3/9/20200 therapy paused due to COVID-19 Pandemic  Advised to continue home PT and add in heating pad to R hip twice daily. DO not use Oral CBD oil to treat musculoskeletal pain

## 2019-02-16 NOTE — Assessment & Plan Note (Signed)
[place] Controlled Substance Database reviewed- no aberrancies noted Reports increase need for lorazepam due to COVID-19 pandemic- refill sent in She denies medication SE

## 2019-02-16 NOTE — Assessment & Plan Note (Signed)
Home BP 145/68, HR 65 Continue Valsartan 160mg  QD, Carvedilol 3.125mg  BID

## 2019-02-16 NOTE — Assessment & Plan Note (Signed)
With occasional hiccups Refill on metoclopramide (reglan) 5mg  sent in If sx's worsen advised to call GI/Dr. Loletha Carrow

## 2019-02-16 NOTE — Progress Notes (Addendum)
Subjective:    Patient ID: Andrea Brooks, female    DOB: Apr 28, 1936, 83 y.o.   MRN: [id number] Virtual Visit via Telephone Note  I connected with Andrea Brooks on 02/20/19 at  9:45 AM EDT by telephone and verified that I am speaking with the correct person using two identifiers.   I discussed the limitations, risks, security and privacy concerns of performing an evaluation and management service by telephone and the availability of in person appointments. The clerical staff discussed with the patient that there may be a patient responsible charge related to this service. The patient expressed understanding and agreed to proceed.   HPI:  Andrea Brooks calls in today with several issues: 1) 4 days ago she reports 4 x episodes of emesis, then developed hiccups She took two doses Reglan and both sx's resolved She requests refill on Reglan, last rx was from 09/08/2018  ED visit She is followed by GI/Dr. Loletha Carrow- she has not called their office about these GI issues 2)  She was going to PT twice weekly, she completed three weeks therapy Per pt- therapy consisted of lunges, band stretches for upper body, stationary bike riding, message, rolling Last PT appt was 3/9/20200 therapy paused due to COVID-19 Pandemic  She reports continued R sciatica pain and would like MRI- this is not an acute issue Discussed that non-urgent imaging is on hold due to COVID-19 precautions- advised to continue home PT and add in heating pad to R hip twice daily. 3) Oral CBD oil to treat musculoskeletal pain- discussed current FDA recommendations and advised her NOT to use due to possible interactions with rx medications. 4) She requests refill on Lorazepam, she has been using anxiolytic more frequently that usual due to current "world situation" She denies medication SE She denies ETOH use She reports home VS- BP 145/68, HR 65 Oral temp 97.42f AM BG 111, denies episodes of hypoglycemia She denies  CP/dyspnea/dizziness/HA/palpitations  Patient Care Team    Relationship Specialty Notifications Start End  Andrea Grandchild, NP PCP - General Family Medicine  04/22/17   Sueanne Margarita, MD PCP - Cardiology Cardiology Admissions 07/04/18     Patient Active Problem List   Diagnosis Date Noted  . Chronic thoracic back pain 01/03/2019  . Acute congestive heart failure ([place])   . Non-ST elevation (NSTEMI) myocardial infarction ([place])   . Acute diastolic (congestive) heart failure ([place]) 07/02/2018  . Elevated troponin 07/02/2018  . Diabetes mellitus without complication ([place]) 91/63/8466  . Diverticulitis-  mild symptoms 03/30/2018  . Irritable bowel syndrome with both constipation and diarrhea 03/30/2018  . Healthcare maintenance 03/17/2018  . Pain of right heel 07/06/2017  . Loose stools 06/24/2017  . Right-sided thoracic back pain 06/24/2017  . Hyperlipidemia 04/22/2017  . GERD (gastroesophageal reflux disease) 04/22/2017  . Impaired glucose metabolism 04/22/2017  . Essential hypertension 04/22/2017  . Anxiety 04/22/2017     Past Medical History:  Diagnosis Date  . Anxiety   . Colon polyps   . Diabetes mellitus without complication ([place])   . Diverticulosis   . Food poisoning   . Gallstones   . GERD (gastroesophageal reflux disease)   . Hyperlipidemia   . Hypertension   . Obesity   . 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 ABDOMINAL HYSTERECTOMY     total  . BREAST BIOPSY Left  neg  . CHOLECYSTECTOMY    . JOINT REPLACEMENT Bilateral    knee  . LEFT HEART CATH AND CORONARY ANGIOGRAPHY N/A 07/04/2018   Procedure: LEFT HEART CATH AND CORONARY ANGIOGRAPHY;  Surgeon: Jettie Booze, MD;  Location: [facility];  Service: Cardiovascular;  Laterality: N/A;  . REPLACEMENT TOTAL KNEE BILATERAL       Family History  Problem Relation Age of Onset  .  Hypertension Mother   . Colon cancer Mother   . Stroke Father   . Heart failure Father   . Stroke Sister   . Stroke Brother   . Graves' disease Daughter   . Breast cancer Paternal Aunt   . Graves' disease Sister   . Bone cancer Sister   . Breast cancer Maternal Aunt   . Esophageal cancer Neg Hx   . Rectal cancer Neg Hx   . Liver cancer Neg Hx      Social History   Substance and Sexual Activity  Drug Use No     Social History   Substance and Sexual Activity  Alcohol Use No     Social History   Tobacco Use  Smoking Status Former Smoker  . Packs/day: 1.00  . Years: 2.00  . Pack years: 2.00  . Last attempt to quit: 11/24/1959  . Years since quitting: 59.2  Smokeless Tobacco Never Used     Outpatient Encounter Medications as of 02/16/2019  Medication Sig  . acetaminophen (TYLENOL) 500 MG tablet Take 1,000 mg by mouth every 6 (six) hours as needed for moderate pain.  Marland Kitchen aspirin EC 81 MG tablet Take 81 mg by mouth daily.  . carvedilol (COREG) 3.125 MG tablet Take 3.125 mg by mouth 2 (two) times daily with a meal.  . dicyclomine (BENTYL) 10 MG capsule Take 10 mg by mouth 4 (four) times daily -  before meals and at bedtime.  Marland Kitchen ezetimibe-simvastatin (VYTORIN) 10-20 MG tablet TAKE 1 TABLET DAILY  . LORazepam (ATIVAN) 1 MG tablet Take 1 tablet (1 mg total) by mouth daily as needed for anxiety.  . metFORMIN (GLUCOPHAGE) 500 MG tablet 1 tab with breakfast and 1 tab with dinner  . metoCLOPramide (REGLAN) 5 MG tablet Take 1 tablet (5 mg total) by mouth every 8 (eight) hours as needed for nausea.  Marland Kitchen omeprazole (PRILOSEC OTC) 20 MG tablet Take 20 mg by mouth daily.  . valsartan (DIOVAN) 160 MG tablet Take 160 mg by mouth daily.  . [DISCONTINUED] LORazepam (ATIVAN) 1 MG tablet Take 1 tablet (1 mg total) by mouth daily as needed for anxiety.   No facility-administered encounter medications on file as of 02/16/2019.     Allergies: Patient has no known allergies.  There is no  height or weight on file to calculate BMI.  There were no vitals taken for this visit.   Review of Systems  Constitutional: Positive for fatigue. Negative for activity change, appetite change, chills, diaphoresis, fever and unexpected weight change.  HENT: Negative for congestion.   Eyes: Negative for visual disturbance.  Respiratory: Negative for cough, chest tightness, shortness of breath, wheezing and stridor.   Cardiovascular: Negative for chest pain, palpitations and leg swelling.  Gastrointestinal: Negative for abdominal distention, abdominal pain, blood in stool, constipation, diarrhea, nausea and vomiting.  Endocrine: Negative for cold intolerance, heat intolerance, polydipsia, polyphagia and polyuria.  Genitourinary: Negative for difficulty urinating, dysuria and flank pain.  Musculoskeletal: Negative for arthralgias, back pain, gait problem, joint swelling, myalgias, neck pain and neck stiffness.  Skin: Negative for color change, pallor, rash and wound.  Neurological: Negative for dizziness and headaches.  Hematological: Does not bruise/bleed easily.  Psychiatric/Behavioral: Negative for agitation, behavioral problems, confusion, decreased concentration, hallucinations, self-injury, sleep disturbance and suicidal ideas. The patient is nervous/anxious. The patient is not hyperactive.       Objective:   Physical Exam No acute distress noted during telephone conversation         Assessment & Plan:   1. Essential hypertension   2. Anxiety   3. Irritable bowel syndrome with both constipation and diarrhea   4. Diabetes mellitus without complication ([place])   5. Chronic right-sided thoracic back pain     Essential hypertension Home BP 145/68, HR 65 Continue Valsartan 160mg  QD, Carvedilol 3.125mg  BID  Anxiety [place] Controlled Substance Database reviewed- no aberrancies noted Reports increase need for lorazepam due to COVID-19 pandemic- refill sent in She denies  medication SE   Irritable bowel syndrome with both constipation and diarrhea With occasional hiccups Refill on metoclopramide (reglan) 5mg  sent in If sx's worsen advised to call GI/Dr. Loletha Carrow  Diabetes mellitus without complication ([facility]) AM BG 111 Denies episodes of hypoglycemia Continue Metformin 500mg  BID  Right-sided thoracic back pain She was going to PT twice weekly, she completed three weeks therapy Per pt- therapy consisted of lunges, band stretches for upper body, stationary bike riding, message, rolling Last PT appt was 3/9/20200 therapy paused due to COVID-19 Pandemic  Advised to continue home PT and add in heating pad to R hip twice daily. DO not use Oral CBD oil to treat musculoskeletal pain    FOLLOW-UP:  No follow-ups on file.

## 2019-03-14 ENCOUNTER — Other Ambulatory Visit: Payer: Self-pay | Admitting: Adult Health

## 2019-04-04 ENCOUNTER — Other Ambulatory Visit: Payer: Self-pay

## 2019-04-04 ENCOUNTER — Ambulatory Visit (INDEPENDENT_AMBULATORY_CARE_PROVIDER_SITE_OTHER): Payer: Medicare HMO | Admitting: Adult Health

## 2019-04-04 ENCOUNTER — Encounter: Payer: Self-pay | Admitting: Adult Health

## 2019-04-04 VITALS — BP 132/72 | HR 73 | Ht 65.0 in | Wt 175.0 lb

## 2019-04-04 DIAGNOSIS — Z0000 Encounter for general adult medical examination without abnormal findings: Secondary | ICD-10-CM | POA: Diagnosis not present

## 2019-04-04 IMAGING — CT CT ABD-PELV W/O CM
2 of 4 series · 16 of 46 positions shown, 18 images · non-contrast
Comparison: None.

CLINICAL DATA: Diarrhea over the last 4 days.  Elevated creatinine.

EXAM:
CT ABDOMEN AND PELVIS WITHOUT CONTRAST
TECHNIQUE: Multidetector CT imaging of the abdomen and pelvis was performed
following the standard protocol without IV contrast.

[Series 2: axial st · axial · 0.98mm/px · z∈[-480,-56]mm · 13 of 93 slices shown, 15 images]
[im 4/93  soft-tissue]
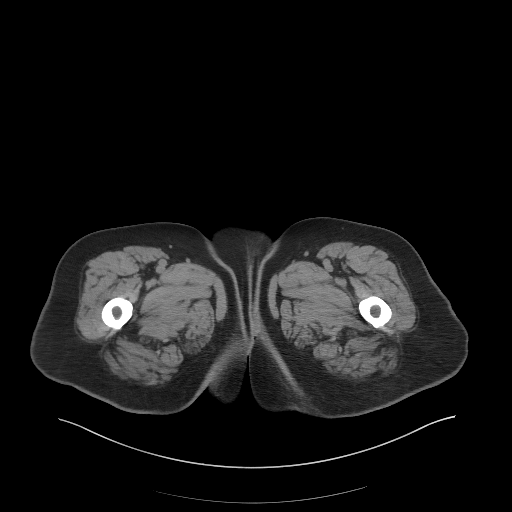
[im 4/93  bone]
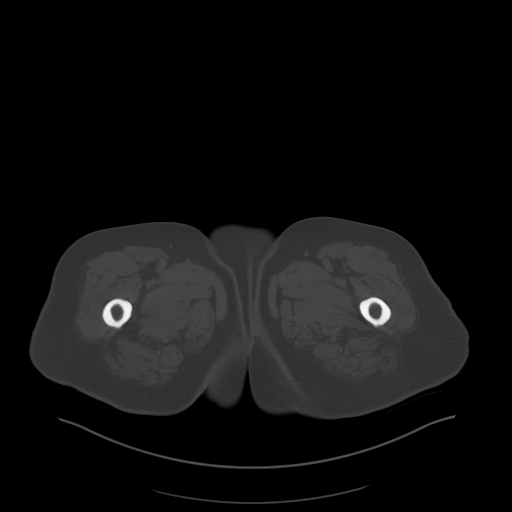
[im 12/93  soft-tissue]
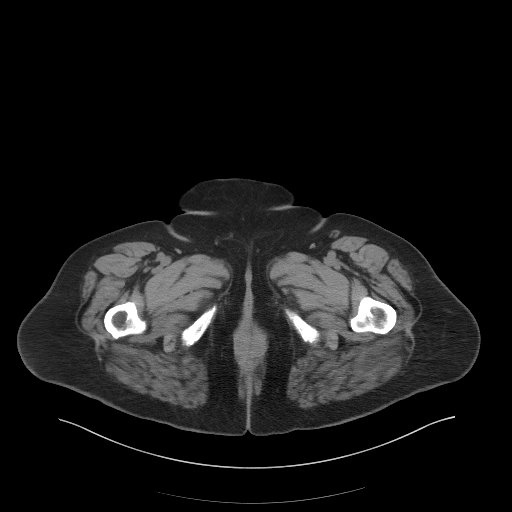
[im 19/93  soft-tissue]
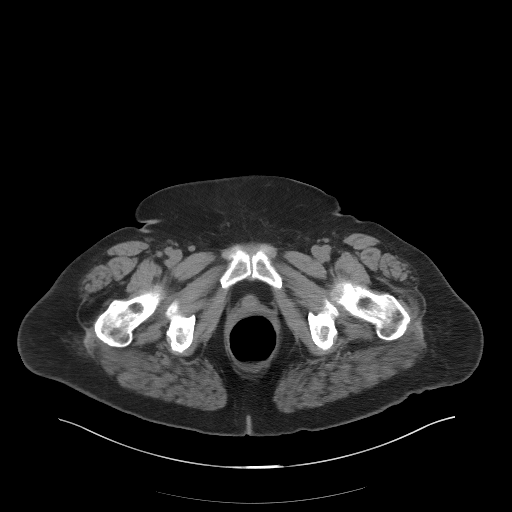
[im 26/93  soft-tissue]
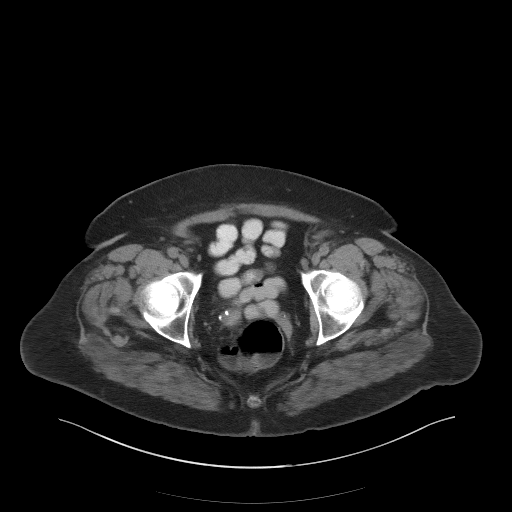
[im 34/93  soft-tissue]
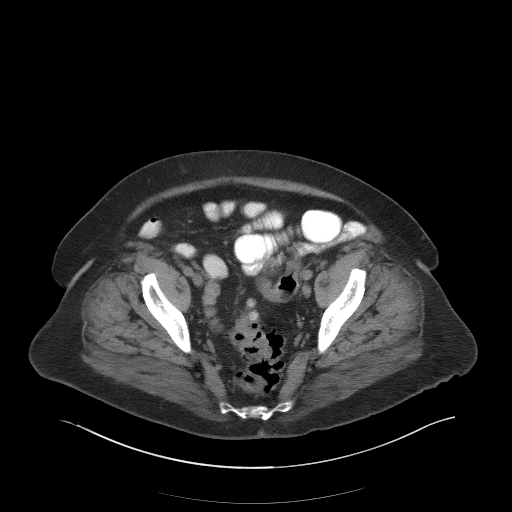
[im 41/93  soft-tissue]
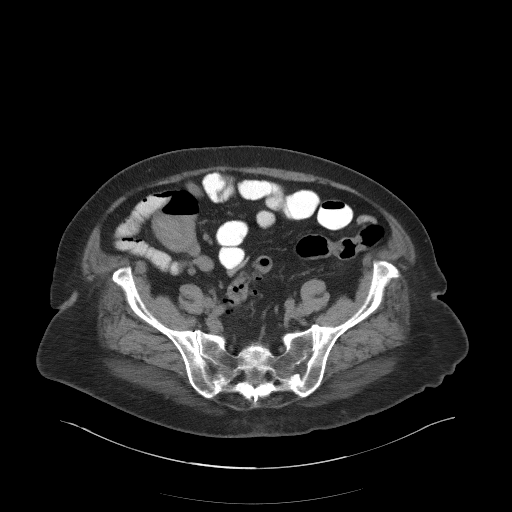
[im 48/93  soft-tissue]
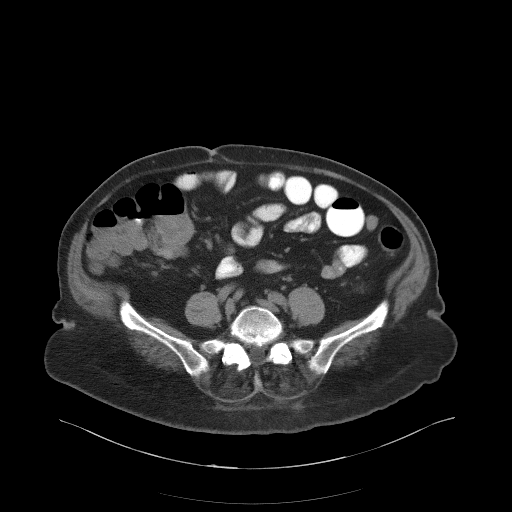
[im 52/93  soft-tissue]
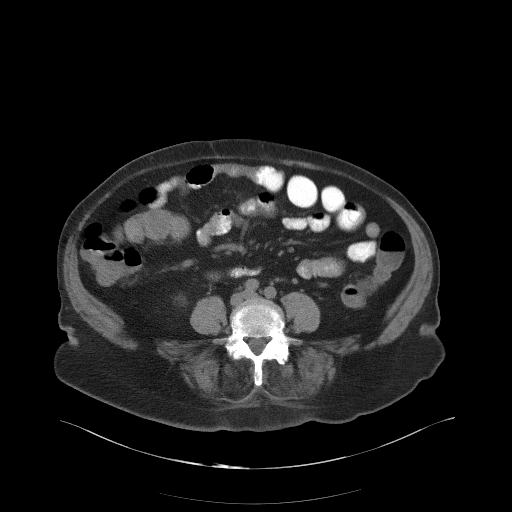
[im 59/93  soft-tissue]
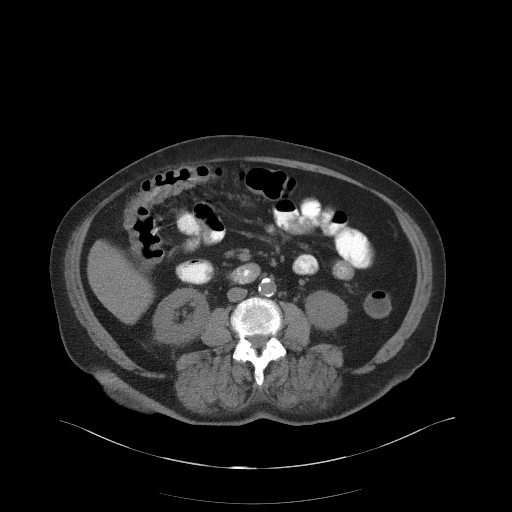
[im 59/93  bone]
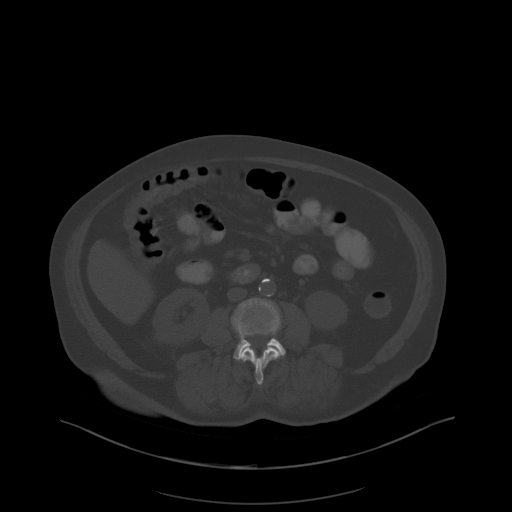
[im 67/93  soft-tissue]
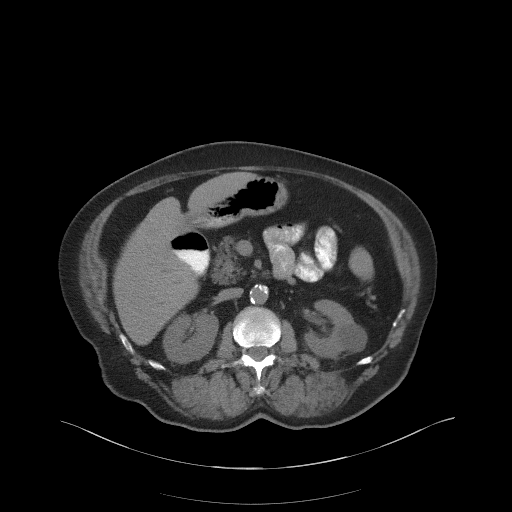
[im 74/93  soft-tissue]
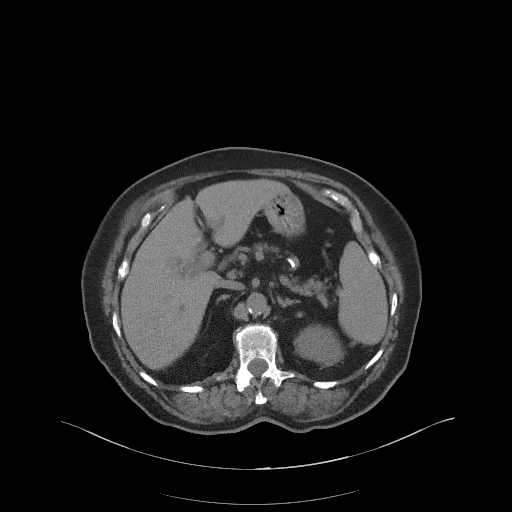
[im 81/93  soft-tissue]
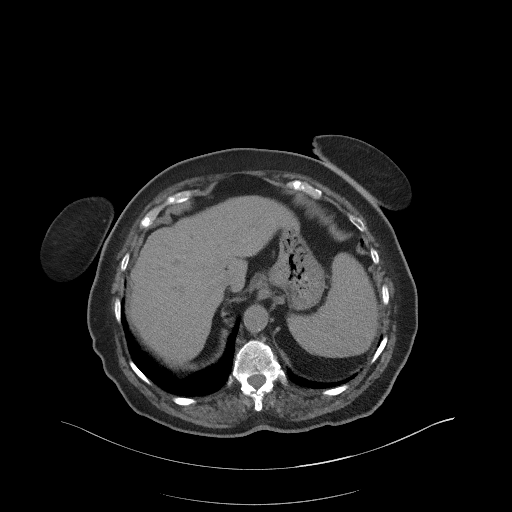
[im 89/93  soft-tissue]
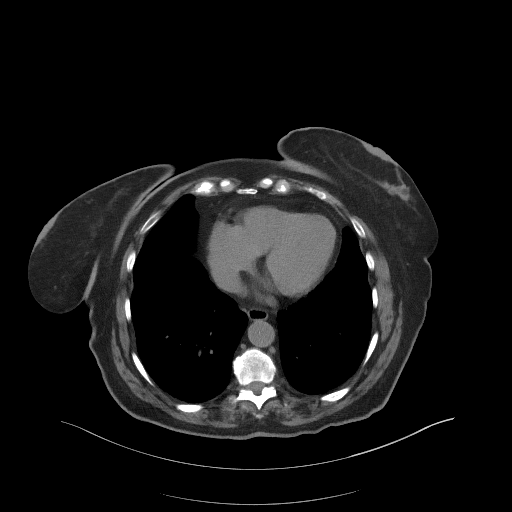

[Series 5: coronal st · coronal · 0.80mm/px · 3 of 101 slices shown]
[im 34/101  soft-tissue]
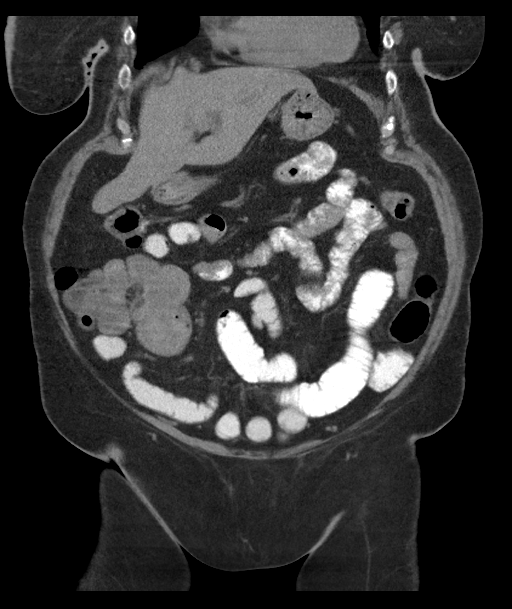
[im 45/101  soft-tissue]
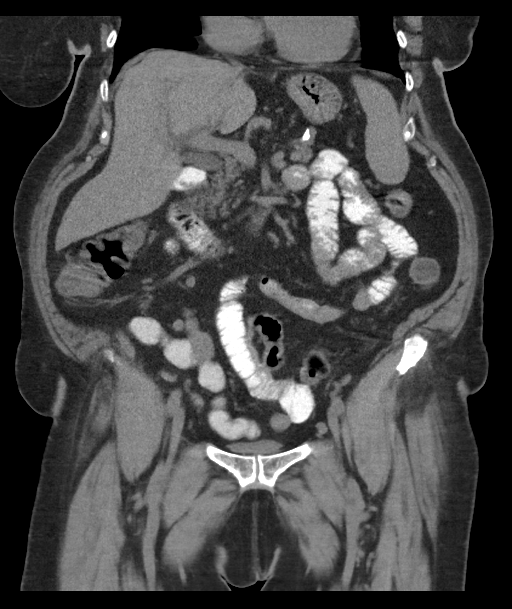
[im 56/101  soft-tissue]
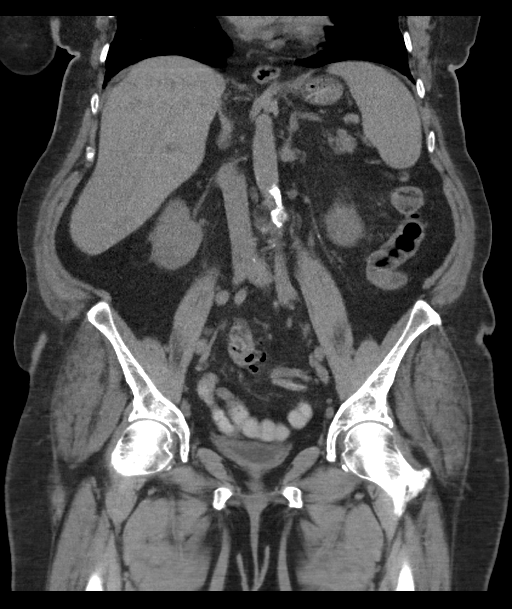

[16 of 46 positions shown; findings below may reference images not displayed]

FINDINGS: Lower chest: Normal except for a small calcified granuloma in the
right lower lobe.

Hepatobiliary: No liver parenchymal abnormality is seen. Previous
cholecystectomy.

Pancreas: Normal

Spleen: Normal

Adrenals/Urinary Tract: Adrenal glands are normal. The right kidney
is normal. Left kidney is normal except for a 2.7 cm cyst in the
lateral midportion. No hydronephrosis. No bladder abnormality seen.

Stomach/Bowel: No sign of bowel obstruction. Liquid stool noted
within the colon consistent with the clinical history of diarrhea.
Sigmoid diverticulosis without imaging evidence of diverticulitis
presently. No sign of bowel wall thickening.

Vascular/Lymphatic: Aortic atherosclerosis. No aneurysm. IVC is
normal. No retroperitoneal adenopathy.

Reproductive: Previous hysterectomy.  No pelvic mass.

Other: No free fluid or air.

Musculoskeletal: Ordinary chronic lower lumbar degenerative changes.
IMPRESSION: Liquid stool throughout the colon consistent with the clinical
history of diarrhea. No evidence of bowel obstruction or convincing
bowel wall edema.

Previous cholecystectomy.  Previous hysterectomy.

Aortic atherosclerosis.

Left renal cyst.

Sigmoid diverticulosis without evidence of diverticulitis. Low level
diverticulitis can be inapparent at imaging.

## 2019-04-04 MED ORDER — LORAZEPAM 1 MG PO TABS: 1.0000 mg | ORAL_TABLET | Freq: Every day | ORAL | 0 refills | Status: DC | PRN

## 2019-04-04 NOTE — Assessment & Plan Note (Signed)
Continue all medications as directed She reports using Lorazepam 1mg : 1/2 tab 1-2 times/week She denies over-sedation issues, denies ETOH use [place] Controlled Substance Database reviewed- no aberrancies noted If Diverticulosis flares increase in frequency, advised to reach out to established GI Remain as active as tolerated, resume PT when she feels comfortable, re: COVID-19 pandemic  F/u 4 months COVID-19 Education: Signs and symptoms of COVID-19 infection were discussed with pt and how to seek care for testing.  The importance of following the Stay at Home order, and when out- Social Distancing and wearing a facial mask were discussed today.  I have personally reviewed and noted the following in the patient's chart:   . Medical and social history . Use of alcohol, tobacco or illicit drugs  . Current medications and supplements . Functional ability and status . Nutritional status . Physical activity . Advanced directives . List of other physicians . Hospitalizations, surgeries, and ER visits in previous 12 months . Vitals . Screenings to include cognitive, depression, and falls . Referrals and appointments  In addition, I have reviewed and discussed with patient certain preventive protocols, quality metrics, and best practice recommendations. A written personalized care plan for preventive services as well as general preventive health recommendations were provided to patient.

## 2019-04-04 NOTE — Progress Notes (Signed)
Virtual Visit via Telephone Note  I connected with Andrea Brooks on 04/04/19 at  1:15 PM EDT by telephone and verified that I am speaking with the correct person using two identifiers.  Location: Patient: Home Provider: In Clinic   I discussed the limitations, risks, security and privacy concerns of performing an evaluation and management service by telephone and the availability of in person appointments. I also discussed with the patient that there may be a patient responsible charge related to this service. The patient expressed understanding and agreed to proceed.   Subjective:   Andrea Brooks is a 83 y.o. female who presents for Medicare Annual (Subsequent) preventive examination.  Review of Systems: General:   No F/C, wt loss Pulm:   No DIB, SOB, pleuritic chest pain Card:  No CP, palpitations Abd:  No n/v/d or pain Ext:  No inc edema from baseline This patient does not have sx concerning for COVID-19 Infection (ie; fever, chills, cough, new or worsening shortness of breath).    Objective:     Vitals: BP 132/72   Pulse 73   Ht 5\' 5"  (1.651 m)   Wt 175 lb (79.4 kg)   BMI 29.12 kg/m   Body mass index is 29.12 kg/m.  Advanced Directives 01/16/2019 09/08/2018 07/02/2018 06/28/2018 08/10/2017 04/22/2017  Does Patient Have a Medical Advance Directive? Yes No No No Yes Yes  Type of Advance Directive Living will;Healthcare Power of [place];Living will [place];Living will  Would patient like information on creating a medical advance directive? - No - Patient declined No - Patient declined No - Patient declined - -    Tobacco Social History   Tobacco Use  Smoking Status Former Smoker  . Packs/day: 1.00  . Years: 2.00  . Pack years: 2.00  . Last attempt to quit: 11/24/1959  . Years since quitting: 59.4  Smokeless Tobacco Never Used     Counseling given: Not Answered  Past Medical History:  Diagnosis Date  . Anxiety    . Colon polyps   . Diabetes mellitus without complication ([place])   . Diverticulosis   . Food poisoning   . Gallstones   . GERD (gastroesophageal reflux disease)   . Hyperlipidemia   . Hypertension   . Obesity   . 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 ABDOMINAL HYSTERECTOMY     total  . BREAST BIOPSY Left    neg  . CHOLECYSTECTOMY    . JOINT REPLACEMENT Bilateral    knee  . LEFT HEART CATH AND CORONARY ANGIOGRAPHY N/A 07/04/2018   Procedure: LEFT HEART CATH AND CORONARY ANGIOGRAPHY;  Surgeon: Jettie Booze, MD;  Location: [facility];  Service: Cardiovascular;  Laterality: N/A;  . REPLACEMENT TOTAL KNEE BILATERAL     Family History  Problem Relation Age of Onset  . Hypertension Mother   . Colon cancer Mother   . Stroke Father   . Heart failure Father   . Stroke Sister   . Stroke Brother   . Graves' disease Daughter   . Breast cancer Paternal Aunt   . Graves' disease Sister   . Bone cancer Sister   . Breast cancer Maternal Aunt   . Esophageal cancer Neg Hx   . Rectal cancer Neg Hx   . Liver cancer Neg Hx    Social History  Socioeconomic History  . Marital status: Married    Spouse name: Not on file  . Number of children: 1  . Years of education: Not on file  . Highest education level: Not on file  Occupational History  . Occupation: retired  Scientific laboratory technician  . Financial resource strain: Not on file  . Food insecurity:    Worry: Not on file    Inability: Not on file  . Transportation needs:    Medical: Not on file    Non-medical: Not on file  Tobacco Use  . Smoking status: Former Smoker    Packs/day: 1.00    Years: 2.00    Pack years: 2.00    Last attempt to quit: 11/24/1959    Years since quitting: 59.4  . Smokeless tobacco: Never Used  Substance and Sexual Activity  . Alcohol use: No  . Drug use: No  . Sexual activity: Not  Currently  Lifestyle  . Physical activity:    Days per week: Not on file    Minutes per session: Not on file  . Stress: Not on file  Relationships  . Social connections:    Talks on phone: Not on file    Gets together: Not on file    Attends religious service: Not on file    Active member of club or organization: Not on file    Attends meetings of clubs or organizations: Not on file    Relationship status: Not on file  Other Topics Concern  . Not on file  Social History Narrative  . Not on file    Outpatient Encounter Medications as of 04/04/2019  Medication Sig  . acetaminophen (TYLENOL) 500 MG tablet Take 1,000 mg by mouth every 6 (six) hours as needed for moderate pain.  Marland Kitchen aspirin EC 81 MG tablet Take 81 mg by mouth daily.  . carvedilol (COREG) 3.125 MG tablet Take 3.125 mg by mouth 2 (two) times daily with a meal.  . dicyclomine (BENTYL) 10 MG capsule Take 10 mg by mouth 4 (four) times daily -  before meals and at bedtime.  Marland Kitchen ezetimibe-simvastatin (VYTORIN) 10-20 MG tablet TAKE 1 TABLET DAILY  . LORazepam (ATIVAN) 1 MG tablet Take 1 tablet (1 mg total) by mouth daily as needed for anxiety.  . metFORMIN (GLUCOPHAGE) 500 MG tablet 1 tab with breakfast and 1 tab with dinner  . metoCLOPramide (REGLAN) 5 MG tablet Take 1 tablet (5 mg total) by mouth every 8 (eight) hours as needed for nausea.  Marland Kitchen omeprazole (PRILOSEC OTC) 20 MG tablet Take 20 mg by mouth daily.  . valsartan (DIOVAN) 160 MG tablet Take 160 mg by mouth daily.   No facility-administered encounter medications on file as of 04/04/2019.     Activities of Daily Living In your present state of health, do you have any difficulty performing the following activities: 04/04/2019 07/03/2018  Hearing? N N  Vision? N N  Difficulty concentrating or making decisions? N N  Walking or climbing stairs? Y Y  Dressing or bathing? N N  Doing errands, shopping? N Y  Some recent data might be hidden    Patient Care Team: Esaw Grandchild, NP as PCP - General (Family Medicine) Sueanne Margarita, MD as PCP - Cardiology (Cardiology)    Assessment:   This is a routine wellness examination for Andrea Brooks.  Exercise Activities and Dietary recommendations Daily stretching and home PT exercises Last formal PT session late March, stopped attending due to COVID-19 pandemic exposure concerns  Trying to consistently follow Diabetic Diet  Fall Risk Fall Risk  04/04/2019 06/15/2018 06/15/2018 11/04/2017 04/22/2017  Falls in the past year? 0 No No No No  Follow up Falls evaluation completed - - - -   Is the patient's home free of loose throw rugs in walkways, pet beds, electrical cords, etc?   yes      Grab bars in the bathroom? no      Handrails on the stairs?   yes      Adequate lighting?   yes  Timed Get Up and Go performed: N/A, encounter not performed in clinic  Depression Screen PHQ 2/9 Scores 04/04/2019 01/03/2019 10/03/2018 07/12/2018  PHQ - 2 Score 0 0 0 0  PHQ- 9 Score 4 3 2 5      Cognitive Function-WNL, excellent     6CIT Screen 04/04/2019 06/15/2018 11/04/2017  What Year? 0 points 0 points 0 points  What month? 0 points 0 points 0 points  What time? 0 points 0 points 0 points  Count back from 20 0 points 0 points 0 points  Months in reverse 0 points 0 points 0 points  Repeat phrase 2 points 0 points 0 points  Total Score 2 0 0    Immunization History  Administered Date(s) Administered  . Influenza, High Dose Seasonal PF 09/19/2018  . Influenza-Unspecified 10/05/2017  . Pneumococcal Conjugate-13 10/08/2014, 10/05/2017  . Pneumococcal Polysaccharide-23 09/08/1999  . Tdap 07/14/2016  . Zoster 09/05/2012  . Zoster Recombinat (Shingrix) 09/22/2018, 01/11/2019    Qualifies for Shingles Vaccine? NO, completed series  Screening Tests Health Maintenance  Topic Date Due  . PNA vac Low Risk Adult (2 of 2 - PPSV23) 10/05/2018  . OPHTHALMOLOGY EXAM  06/03/2019  . INFLUENZA VACCINE  06/24/2019  . HEMOGLOBIN A1C   07/04/2019  . FOOT EXAM  10/04/2019  . TETANUS/TDAP  07/14/2026  . DEXA SCAN  Completed    Cancer Screenings: Lung: Low Dose CT Chest recommended if Age 19-80 years, 30 pack-year currently smoking OR have quit w/in 15years. Patient does not qualify. Breast:  Up to date on Mammogram? Yes   Up to date of Bone Density/Dexa? Yes Colorectal: NA/, aged out  Additional Screenings:  Hepatitis C Screening: N/A     Plan:  Continue all medications as directed She reports using Lorazepam 1mg : 1/2 tab 1-2 times/week She denies over-sedation issues, denies ETOH use [place] Controlled Substance Database reviewed- no aberrancies noted If Diverticulosis flares increase in frequency, advised to reach out to established GI Remain as active as tolerated, resume PT when she feels comfortable, re: COVID-19 pandemic  F/u 4 months COVID-19 Education: Signs and symptoms of COVID-19 infection were discussed with pt and how to seek care for testing.  The importance of following the Stay at Home order, and when out- Social Distancing and wearing a facial mask were discussed today.  I have personally reviewed and noted the following in the patient's chart:   . Medical and social history . Use of alcohol, tobacco or illicit drugs  . Current medications and supplements . Functional ability and status . Nutritional status . Physical activity . Advanced directives . List of other physicians . Hospitalizations, surgeries, and ER visits in previous 12 months . Vitals . Screenings to include cognitive, depression, and falls . Referrals and appointments  In addition, I have reviewed and discussed with patient certain preventive protocols, quality metrics, and best practice recommendations. A written personalized care plan for preventive services as well as general  preventive health recommendations were provided to patient.     Esaw Grandchild, NP  04/04/2019

## 2019-04-06 ENCOUNTER — Other Ambulatory Visit: Payer: Self-pay | Admitting: Adult Health

## 2019-04-08 IMAGING — CR DG CHEST 2V
2 series · 2 of 2 positions shown · non-contrast
Comparison: None

CLINICAL DATA: Shortness of breath.

EXAM:
CHEST - 2 VIEW

[w chest lat]
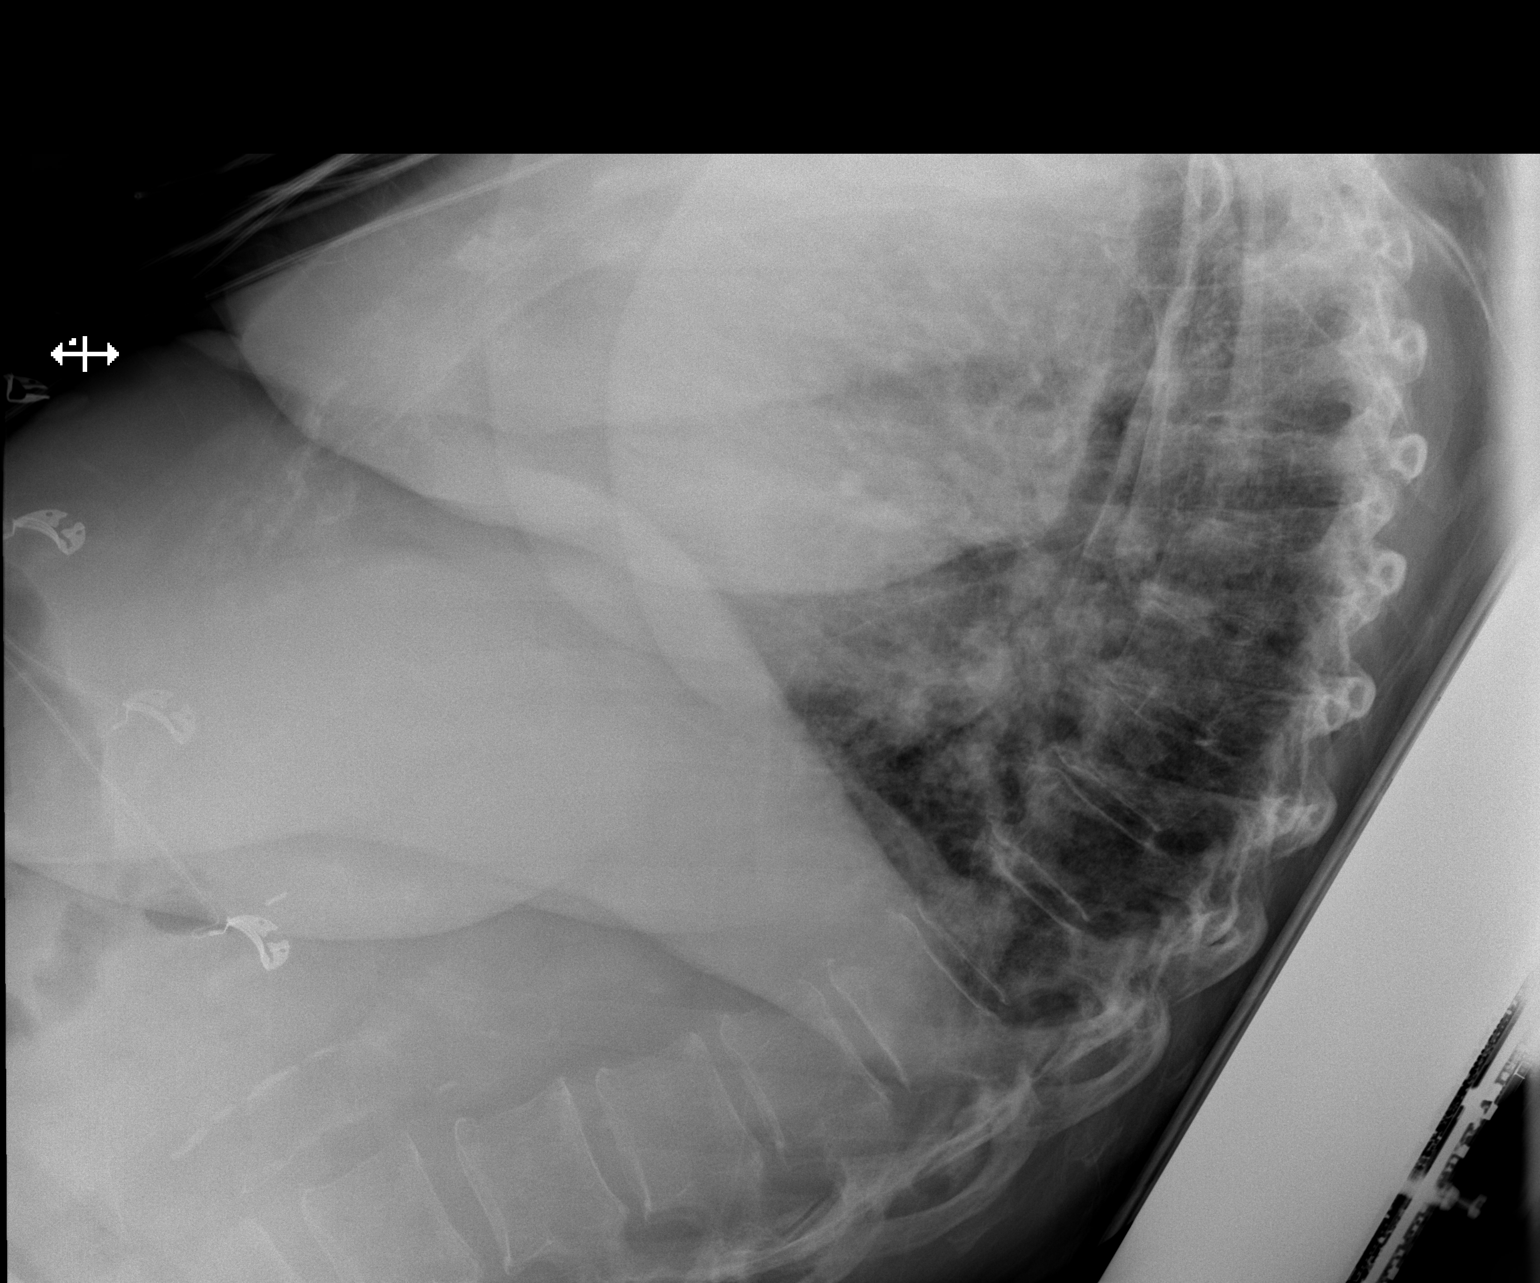

[x chest ap]
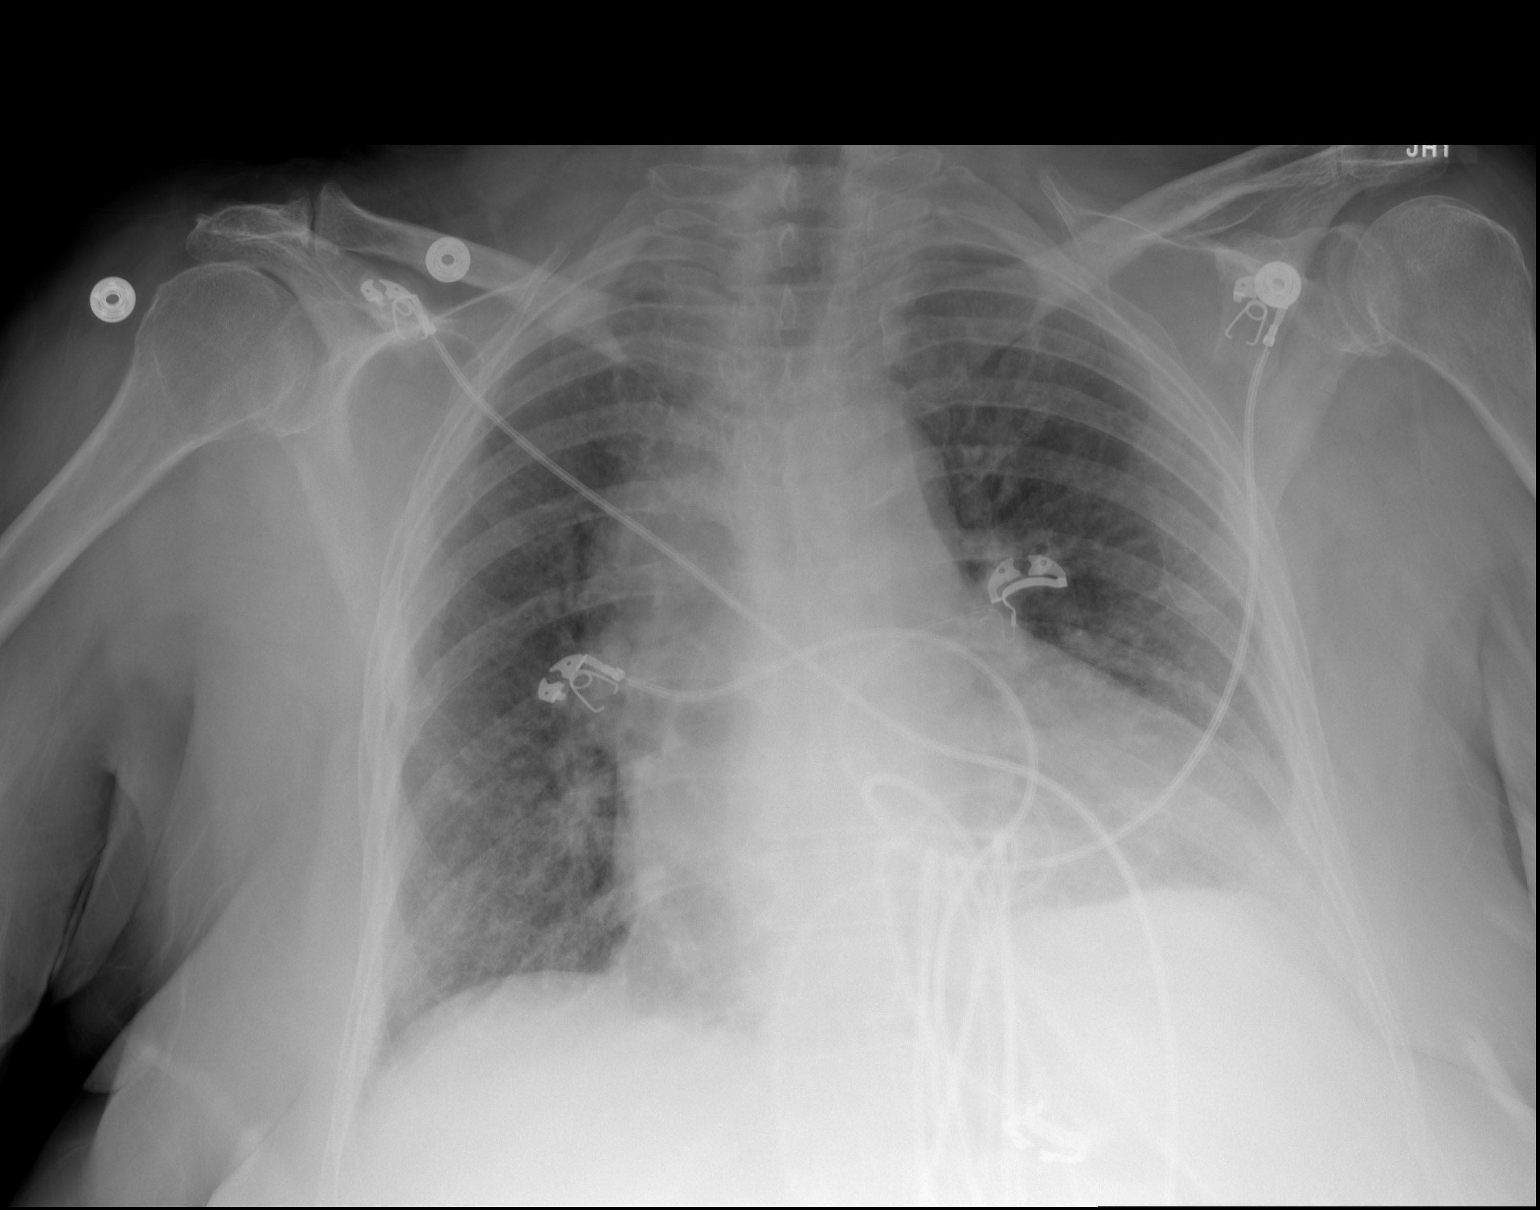

[2 of 2 positions shown; findings below may reference images not displayed]

FINDINGS: Mild cardiac enlargement. Pulmonary vascular congestion. No airspace
opacities. The visualized skeletal structures are unremarkable.
IMPRESSION: 1. Cardiac enlargement and pulmonary vascular congestion.

## 2019-04-19 ENCOUNTER — Telehealth: Payer: Self-pay | Admitting: Internal Medicine

## 2019-04-19 NOTE — Telephone Encounter (Signed)
[facility] pre-reg complete, declined mychart, verbal consent given 04/19/2019 MS

## 2019-04-21 ENCOUNTER — Telehealth (INDEPENDENT_AMBULATORY_CARE_PROVIDER_SITE_OTHER): Payer: Medicare HMO | Admitting: Internal Medicine

## 2019-04-21 ENCOUNTER — Telehealth: Payer: Self-pay | Admitting: Internal Medicine

## 2019-04-21 ENCOUNTER — Encounter: Payer: Self-pay | Admitting: Internal Medicine

## 2019-04-21 DIAGNOSIS — E119 Type 2 diabetes mellitus without complications: Secondary | ICD-10-CM | POA: Diagnosis not present

## 2019-04-21 DIAGNOSIS — I10 Essential (primary) hypertension: Secondary | ICD-10-CM | POA: Diagnosis not present

## 2019-04-21 DIAGNOSIS — K582 Mixed irritable bowel syndrome: Secondary | ICD-10-CM | POA: Diagnosis not present

## 2019-04-21 DIAGNOSIS — I5043 Acute on chronic combined systolic (congestive) and diastolic (congestive) heart failure: Secondary | ICD-10-CM | POA: Diagnosis not present

## 2019-04-21 DIAGNOSIS — E782 Mixed hyperlipidemia: Secondary | ICD-10-CM | POA: Diagnosis not present

## 2019-04-21 MED ORDER — CARVEDILOL 3.125 MG PO TABS: 3.1250 mg | ORAL_TABLET | Freq: Two times a day (BID) | ORAL | 3 refills | Status: DC

## 2019-04-21 NOTE — Telephone Encounter (Signed)
Patient called to review e-visit instructions. Patient aware that the following changes have been made: decrease metformin from BID to QD Patient aware that they will need the following labs: NONE Patient aware that they will need the following test(s): NONE  Recall for 12 months entered.   No further assistance needed at this time.

## 2019-04-21 NOTE — Patient Instructions (Signed)
Medication Instructions:  DECREASE metformin to 500mg  once daily CONTINUE all other medications ** valsartan was refilled by PCP ** Dr. Debara Pickett refilled your carvedilol  If you need a refill on your cardiac medications before your next appointment, please call your pharmacy.    Follow-Up: At [facility], you and your health needs are our priority.  As part of our continuing mission to provide you with exceptional heart care, we have created designated Provider Care Teams.  These Care Teams include your primary Cardiologist (physician) and Advanced Practice Providers (APPs -  Physician Assistants and Nurse Practitioners) who all work together to provide you with the care you need, when you need it. You will need a follow up appointment in 12 months.  Please call our office 2 months in advance to schedule this appointment.  You may see Dr. Debara Pickett or one of the following Advanced Practice Providers on your designated Care Team: Almyra Deforest, [place] . Fabian Sharp, PA-C

## 2019-04-21 NOTE — Progress Notes (Signed)
Virtual Visit via Telephone Note   This visit type was conducted due to national recommendations for restrictions regarding the COVID-19 Pandemic (e.g. social distancing) in an effort to limit this patient's exposure and mitigate transmission in our community.  Due to her co-morbid illnesses, this patient is at least at moderate risk for complications without adequate follow up.  This format is felt to be most appropriate for this patient at this time.  The patient did not have access to video technology/had technical difficulties with video requiring transitioning to audio format only (telephone).  All issues noted in this document were discussed and addressed.  No physical exam could be performed with this format.  Please refer to the patient's chart for her  consent to telehealth for [facility]   Evaluation Performed: Telephone follow-up  Date:  04/21/2019   ID:  Andrea Brooks, DOB 24-Apr-1936, MRN [id number]  Patient Location:  [place] [address]  Provider location:   [address] [place] [address], [place] [address]  PCP:  Esaw Grandchild, NP  Cardiologist:  Fransico Him, MD Electrophysiologist:  None   Chief Complaint: No complaints  History of Present Illness:    Andrea Brooks is a 83 y.o. female who presents via audio/video conferencing for a telehealth visit today.  Ms. Mclaren is a pleasant 83 year old female with a history of diastolic heart failure in the setting of frequent diarrhea and diagnosis of irritable bowel syndrome and GERD, also has hypertension, dyslipidemia and obesity with recent 10 pound weight loss.  She continues to have episodes of some diarrhea.  She had been followed by Dr. Loletha Carrow, but has not been seen in some time.  She also has type 2 diabetes and has been on metformin 500 mg twice daily.  I felt this might be contributing some to her diarrhea however has never been discontinued and the dose has not been decreased.  Her  second most recent hemoglobin A1c was 5.6 however in February it did go up to 6.3.  Despite this her blood sugars recently have been between 101 110 mg/dL on a daily basis.  I think she possibly could reduce the dose of her metformin to 500 mg once a day given her 10 pound weight loss recently.  The patient does not have symptoms concerning for COVID-19 infection (fever, chills, cough, or new SHORTNESS OF BREATH).    Prior CV studies:   The following studies were reviewed today:  Chart review Labwork  PMHx:  Past Medical History:  Diagnosis Date   Anxiety    Colon polyps    Diabetes mellitus without complication ([place])    Diverticulosis    Food poisoning    Gallstones    GERD (gastroesophageal reflux disease)    Hyperlipidemia    Hypertension    Obesity    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   Left forearm and Left forearm superior    Past Surgical History:  Procedure Laterality Date   ABDOMINAL HYSTERECTOMY     total   BREAST BIOPSY Left    neg   CHOLECYSTECTOMY     JOINT REPLACEMENT Bilateral    knee   LEFT HEART CATH AND CORONARY ANGIOGRAPHY N/A 07/04/2018   Procedure: LEFT HEART CATH AND CORONARY ANGIOGRAPHY;  Surgeon: Jettie Booze, MD;  Location: [facility];  Service: Cardiovascular;  Laterality: N/A;   REPLACEMENT TOTAL KNEE BILATERAL      FAMHx:  Family History  Problem  Relation Age of Onset   Hypertension Mother    Colon cancer Mother    Stroke Father    Heart failure Father    Stroke Sister    Stroke Brother    Graves' disease Daughter    Breast cancer Paternal Aunt    Graves' disease Sister    Bone cancer Sister    Breast cancer Maternal Aunt    Esophageal cancer Neg Hx    Rectal cancer Neg Hx    Liver cancer Neg Hx     SOCHx:   reports that she quit smoking about 59 years ago. She has a 2.00 pack-year smoking history. She has never used smokeless tobacco. She reports that she does  not drink alcohol or use drugs.  ALLERGIES:  No Known Allergies  MEDS:  Current Meds  Medication Sig   acetaminophen (TYLENOL) 500 MG tablet Take 1,000 mg by mouth every 6 (six) hours as needed for moderate pain.   aspirin EC 81 MG tablet Take 81 mg by mouth daily.   carvedilol (COREG) 3.125 MG tablet Take 3.125 mg by mouth 2 (two) times daily with a meal.   dicyclomine (BENTYL) 10 MG capsule Take 10 mg by mouth 4 (four) times daily -  before meals and at bedtime.   ezetimibe-simvastatin (VYTORIN) 10-20 MG tablet TAKE 1 TABLET DAILY   LORazepam (ATIVAN) 1 MG tablet Take 1 tablet (1 mg total) by mouth daily as needed for anxiety.   metFORMIN (GLUCOPHAGE) 500 MG tablet 1 tab with breakfast and 1 tab with dinner (Patient taking differently: Take 500 mg by mouth 2 (two) times daily with a meal. 1 tab with breakfast and 1 tab with dinner)   metoCLOPramide (REGLAN) 5 MG tablet Take 1 tablet (5 mg total) by mouth every 8 (eight) hours as needed for nausea.   omeprazole (PRILOSEC OTC) 20 MG tablet Take 20 mg by mouth daily.   valsartan (DIOVAN) 160 MG tablet TAKE 1 TABLET DAILY ( MUST HAVE OFFICE VISIT PRIOR TO ANY FURTHER   REFILLS )     ROS: Pertinent items noted in HPI and remainder of comprehensive ROS otherwise negative.  Labs/Other Tests and Data Reviewed:    Recent Labs: 06/30/2018: Magnesium 1.9 07/02/2018: B Natriuretic Peptide 345.5 09/08/2018: ALT 17; BUN 16; Creatinine, Ser 0.93; Hemoglobin 13.9; Platelets 239; Potassium 3.7; Sodium 140   Recent Lipid Panel Lab Results  Component Value Date/Time   CHOL 120 10/13/2018 09:03 AM   TRIG 162 (H) 10/13/2018 09:03 AM   HDL 31 (L) 10/13/2018 09:03 AM   CHOLHDL 3.9 10/13/2018 09:03 AM   CHOLHDL 3.5 07/04/2018 05:26 AM   LDLCALC 57 10/13/2018 09:03 AM    Wt Readings from Last 3 Encounters:  04/21/19 174 lb 12.8 oz (79.3 kg)  04/04/19 175 lb (79.4 kg)  01/03/19 183 lb 4.8 oz (83.1 kg)     Exam:    Vital Signs:  BP  134/67    Pulse 75    Temp 97.9 F (36.6 C)    Ht 5\' 5"  (1.651 m)    Wt 174 lb 12.8 oz (79.3 kg)    BMI 29.09 kg/m    Exam not performed due to telephone visit  ASSESSMENT & PLAN:    1. Acute diastolic congestive heart failure (EF 60 to 65%, 06/2018) 2. Dyslipidemia 3. Hypertension 4. Mild nonobstructive coronary disease (cath 06/2018)  Mrs. Laperle has had no recurrent diastolic congestive heart failure.  Her blood pressures are generally well controlled.  We  will refill her carvedilol today.  She recently had a refill of the losartan.  She does have mild nonobstructive coronary disease by cath in 2019.  Her goal LDL is less than 70, and in November 2019 her LDL was 57.  Hemoglobin A1c recently was 6.3 however blood sugars have trended lower since then and she has had 10 pound weight loss.  She continues to have some loose stools and "IBS symptoms".  I recommend decreasing her metformin to 500 mg once a day to see if it gives her some improvement and if there is some improvement, she should work with her PCP to come off metformin and consider an alternative such as Jardiance which would have indication given her coronary disease and type 2 diabetes.  COVID-19 Education: The signs and symptoms of COVID-19 were discussed with the patient and how to seek care for testing (follow up with PCP or arrange E-visit).  The importance of social distancing was discussed today.  Patient Risk:   After full review of this patients clinical status, I feel that they are at least moderate risk at this time.  Time:   Today, I have spent 25 minutes with the patient with telehealth technology discussing hypertension, dyslipidemia, type 2 diabetes, diarrhea.     Medication Adjustments/Labs and Tests Ordered: Current medicines are reviewed at length with the patient today.  Concerns regarding medicines are outlined above.   Tests Ordered: No orders of the defined types were placed in this  encounter.   Medication Changes: No orders of the defined types were placed in this encounter.   Disposition:  in 1 year(s)  Pixie Casino, MD, [facility], [place] Director of the Advanced Lipid Disorders &  Cardiovascular Risk Reduction Clinic Diplomate of the American Board of Clinical Lipidology Attending Cardiologist  Direct Dial: [phone]   Fax: [phone]  Website:  [email or web address]  Pixie Casino, MD  04/21/2019 11:02 AM

## 2019-06-08 DIAGNOSIS — H35033 Hypertensive retinopathy, bilateral: Secondary | ICD-10-CM | POA: Diagnosis not present

## 2019-06-08 DIAGNOSIS — H2513 Age-related nuclear cataract, bilateral: Secondary | ICD-10-CM | POA: Diagnosis not present

## 2019-06-08 DIAGNOSIS — H25043 Posterior subcapsular polar age-related cataract, bilateral: Secondary | ICD-10-CM | POA: Diagnosis not present

## 2019-06-08 DIAGNOSIS — H25013 Cortical age-related cataract, bilateral: Secondary | ICD-10-CM | POA: Diagnosis not present

## 2019-06-08 LAB — HM DIABETES EYE EXAM

## 2019-06-15 ENCOUNTER — Other Ambulatory Visit: Payer: Self-pay | Admitting: Adult Health

## 2019-06-15 ENCOUNTER — Telehealth: Payer: Self-pay | Admitting: Adult Health

## 2019-06-15 NOTE — Telephone Encounter (Signed)
Patient got approval from insurance for her lorazepam, she called her pharm and they are requesting a new order to be sent for this med. If approved [facility] [address].

## 2019-06-15 NOTE — Telephone Encounter (Signed)
Refill request sent to Mina Marble, NP.  Charyl Bigger, CMA

## 2019-06-17 ENCOUNTER — Other Ambulatory Visit: Payer: Self-pay | Admitting: Adult Health

## 2019-06-29 ENCOUNTER — Other Ambulatory Visit: Payer: Self-pay

## 2019-06-29 DIAGNOSIS — D0461 Carcinoma in situ of skin of right upper limb, including shoulder: Secondary | ICD-10-CM | POA: Diagnosis not present

## 2019-06-29 DIAGNOSIS — D485 Neoplasm of uncertain behavior of skin: Secondary | ICD-10-CM | POA: Diagnosis not present

## 2019-06-29 DIAGNOSIS — L570 Actinic keratosis: Secondary | ICD-10-CM | POA: Diagnosis not present

## 2019-06-29 DIAGNOSIS — H61009 Unspecified perichondritis of external ear, unspecified ear: Secondary | ICD-10-CM | POA: Diagnosis not present

## 2019-08-07 NOTE — Progress Notes (Signed)
Subjective:    Patient ID: Andrea Brooks, female    DOB: [birth year], 83 y.o.   MRN: [id number]  HPI: 02/16/2019 OV: HPI:  Ms. Grime calls in today with several issues: 1) 4 days ago she reports 4 x episodes of emesis, then developed hiccups She took two doses Reglan and both sx's resolved She requests refill on Reglan, last rx was from 09/08/2018  ED visit She is followed by GI/Dr. Loletha Carrow- she has not called their office about these GI issues 2)  She was going to PT twice weekly, she completed three weeks therapy Per pt- therapy consisted of lunges, band stretches for upper body, stationary bike riding, message, rolling Last PT appt was 3/9/20200 therapy paused due to COVID-19 Pandemic  She reports continued R sciatica pain and would like MRI- this is not an acute issue Discussed that non-urgent imaging is on hold due to COVID-19 precautions- advised to continue home PT and add in heating pad to R hip twice daily. 3) Oral CBD oil to treat musculoskeletal pain- discussed current FDA recommendations and advised her NOT to use due to possible interactions with rx medications. 4) She requests refill on Lorazepam, she has been using anxiolytic more frequently that usual due to current "world situation" She denies medication SE She denies ETOH use She reports home VS- BP 145/68, HR 65 Oral temp 97.50f AM BG 111, denies episodes of hypoglycemia She denies CP/dyspnea/dizziness/HA/palpitations  08/08/2019 OV:  Ms. Hogenson is here for regular f/u: AM BG 100-130s,denies episodes of hypogylcemia She is currently on Metformin 500mg  QD She denies any diarrhea, reports "rabbit pellet type BMs" She has dx of IBS- C/D Lab Results  Component Value Date   HGBA1C 6.2 (A) 08/08/2019   HGBA1C 6.3 (A) 01/03/2019   HGBA1C 5.8 10/03/2018   She walks daily in her yard, denies any other "formal exercise". She reports difficulty standing up straight, esp. When  "trying to put away dishes". 09/2018  DEXA- ASSESSMENT: The BMD measured at AP Spine L1-L4 (L3) is 1.088 g/cm2 with a T-score of -0.8. This patient is considered normal according to [facility]) criteria. The quality of the scan is good. L-3 was excluded due to (degenerative changes.  Discussed optimizing Vit D and Calcium intake and increasing weight bearing exercise. She denies any recent falls.  She reports overall increase in anxiety levels r/t pandemic [place] Controlled Substance Database reviewed-no aberrancies noted Discussed using Lorazepam VERY sparingly She has been on >5 years, denies SE Denies falls She denies any ETOH use  Patient Care Team    Relationship Specialty Notifications Start End  Mina Marble D, NP PCP - General Family Medicine  04/22/17   Pixie Casino, MD PCP - Cardiology Cardiology Admissions 04/21/19     Patient Active Problem List   Diagnosis Date Noted  . Encounter for Medicare annual wellness exam 04/04/2019  . Chronic thoracic back pain 01/03/2019  . Acute congestive heart failure ([place])   . Non-ST elevation (NSTEMI) myocardial infarction ([place])   . Acute diastolic (congestive) heart failure ([place]) 07/02/2018  . Elevated troponin 07/02/2018  . Diabetes mellitus without complication ([place]) 0000000  . Diverticulitis-  mild symptoms 03/30/2018  . Irritable bowel syndrome with both constipation and diarrhea 03/30/2018  . Healthcare maintenance 03/17/2018  . Pain of right heel 07/06/2017  . Loose stools 06/24/2017  . Right-sided thoracic back pain 06/24/2017  . Hyperlipidemia 04/22/2017  . GERD (gastroesophageal reflux disease) 04/22/2017  . Impaired glucose metabolism 04/22/2017  .  Essential hypertension 04/22/2017  . Anxiety 04/22/2017     Past Medical History:  Diagnosis Date  . Anxiety   . Colon polyps   . Diabetes mellitus without complication ([place])   . Diverticulosis   . Food poisoning   . Gallstones   . GERD (gastroesophageal reflux  disease)   . Hyperlipidemia   . Hypertension   . Obesity   . 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 ABDOMINAL HYSTERECTOMY     total  . BREAST BIOPSY Left    neg  . CHOLECYSTECTOMY    . JOINT REPLACEMENT Bilateral    knee  . LEFT HEART CATH AND CORONARY ANGIOGRAPHY N/A 07/04/2018   Procedure: LEFT HEART CATH AND CORONARY ANGIOGRAPHY;  Surgeon: Jettie Booze, MD;  Location: [facility];  Service: Cardiovascular;  Laterality: N/A;  . REPLACEMENT TOTAL KNEE BILATERAL       Family History  Problem Relation Age of Onset  . Hypertension Mother   . Colon cancer Mother   . Stroke Father   . Heart failure Father   . Stroke Sister   . Stroke Brother   . Graves' disease Daughter   . Breast cancer Paternal Aunt   . Graves' disease Sister   . Bone cancer Sister   . Breast cancer Maternal Aunt   . Esophageal cancer Neg Hx   . Rectal cancer Neg Hx   . Liver cancer Neg Hx      Social History   Substance and Sexual Activity  Drug Use No     Social History   Substance and Sexual Activity  Alcohol Use No     Social History   Tobacco Use  Smoking Status Former Smoker  . Packs/day: 1.00  . Years: 2.00  . Pack years: 2.00  . Quit date: 11/24/1959  . Years since quitting: 59.7  Smokeless Tobacco Never Used     Outpatient Encounter Medications as of 08/08/2019  Medication Sig  . acetaminophen (TYLENOL) 500 MG tablet Take 1,000 mg by mouth every 6 (six) hours as needed for moderate pain.  Marland Kitchen aspirin EC 81 MG tablet Take 81 mg by mouth daily.  . carvedilol (COREG) 3.125 MG tablet Take 1 tablet (3.125 mg total) by mouth 2 (two) times daily with a meal.  . dicyclomine (BENTYL) 10 MG capsule Take 10 mg by mouth 4 (four) times daily -  before meals and at bedtime.  Marland Kitchen ezetimibe-simvastatin (VYTORIN) 10-20 MG tablet TAKE 1 TABLET DAILY  . LORazepam  (ATIVAN) 1 MG tablet 1/2 tablet daily as needed for anxiety  . metFORMIN (GLUCOPHAGE) 500 MG tablet Take 1 tablet (500 mg total) by mouth daily with breakfast.  . metoCLOPramide (REGLAN) 5 MG tablet Take 1 tablet (5 mg total) by mouth every 8 (eight) hours as needed for nausea.  Marland Kitchen omeprazole (PRILOSEC OTC) 20 MG tablet Take 20 mg by mouth daily.  . valsartan (DIOVAN) 160 MG tablet Take one tablet by mouth daily  . [DISCONTINUED] LORazepam (ATIVAN) 1 MG tablet TAKE 1 TABLET BY MOUTH DAILY AS NEEDED FOR ANXIETY  . [DISCONTINUED] metFORMIN (GLUCOPHAGE) 500 MG tablet TAKE 1 TABLET WITH BREAKFAST AND 1 TABLET WITH DINNER  . [DISCONTINUED] valsartan (DIOVAN) 160 MG tablet TAKE 1 TABLET DAILY ( MUST HAVE OFFICE VISIT PRIOR TO ANY FURTHER   REFILLS )   No facility-administered encounter medications  on file as of 08/08/2019.     Allergies: Patient has no known allergies.  Body mass index is 29.3 kg/m.  Blood pressure 136/72, pulse 63, temperature 98.5 F (36.9 C), temperature source Oral, height 5\' 5"  (1.651 m), weight 176 lb 1.6 oz (79.9 kg), SpO2 98 %.     Review of Systems  Constitutional: Positive for fatigue. Negative for activity change, appetite change, chills, diaphoresis, fever and unexpected weight change.  Eyes: Negative for visual disturbance.  Respiratory: Negative for cough, chest tightness, shortness of breath, wheezing and stridor.   Cardiovascular: Negative for chest pain and leg swelling.  Gastrointestinal: Positive for abdominal distention. Negative for abdominal pain, blood in stool, constipation, diarrhea, nausea and vomiting.  Endocrine: Negative for cold intolerance, heat intolerance, polydipsia, polyphagia and polyuria.  Genitourinary: Negative for difficulty urinating and flank pain.  Musculoskeletal: Positive for arthralgias, back pain and myalgias. Negative for gait problem, joint swelling, neck pain and neck stiffness.  Skin: Negative for color change, pallor, rash  and wound.  Neurological: Negative for dizziness and headaches.  Hematological: Negative for adenopathy. Does not bruise/bleed easily.  Psychiatric/Behavioral: Positive for sleep disturbance. The patient is nervous/anxious.        Objective:   Physical Exam Vitals signs and nursing note reviewed.  Constitutional:      Appearance: Normal appearance.  HENT:     Head: Normocephalic and atraumatic.  Cardiovascular:     Rate and Rhythm: Normal rate and regular rhythm.     Pulses: Normal pulses.     Heart sounds: Normal heart sounds.  Pulmonary:     Effort: Pulmonary effort is normal. No respiratory distress.     Breath sounds: Normal breath sounds. No stridor. No wheezing, rhonchi or rales.  Chest:     Chest wall: No tenderness.  Musculoskeletal:     Cervical back: She exhibits no tenderness.     Thoracic back: She exhibits no tenderness.     Lumbar back: She exhibits no tenderness.  Skin:    General: Skin is warm and dry.     Capillary Refill: Capillary refill takes less than 2 seconds.  Neurological:     Mental Status: She is alert and oriented to person, place, and time.     Coordination: Coordination normal.  Psychiatric:        Mood and Affect: Mood normal.        Behavior: Behavior normal.        Thought Content: Thought content normal.        Judgment: Judgment normal.       Assessment & Plan:   1. Diabetes mellitus without complication ([place])   2. Essential hypertension   3. Hyperlipidemia, unspecified hyperlipidemia type   4. Healthcare maintenance   5. Anxiety   6. Irritable bowel syndrome with both constipation and diarrhea     Anxiety [place] Controlled Substance Database reviewed-no aberrancies noted Discussed using Lorazepam VERY sparingly She has been on >5 years, denies SE Denies falls  Diabetes mellitus without complication ([place]) Lab Results  Component Value Date   HGBA1C 6.2 (A) 08/08/2019   HGBA1C 6.3 (A) 01/03/2019   HGBA1C 5.8  10/03/2018  AM BG 100-130s,denies episodes of hypogylcemia She is currently on Metformin 500mg  QD Microalbumin-normal On Valsartan 160mg  QD  Essential hypertension BP at goal 136/72, HR 63 Continue Carvedilol 3.125mg  BID, Valsaran 160mg  QD   Irritable bowel syndrome with both constipation and diarrhea Remain well hydrated Probiotic  Hyperlipidemia Vytorin 10-20mg  QD  FOLLOW-UP:  Return in about 3 months (around 11/07/2019) for Regular Follow Up, HTN, Hypercholestermia, Fasting Labs, Diabetes.

## 2019-08-08 ENCOUNTER — Encounter: Payer: Self-pay | Admitting: Adult Health

## 2019-08-08 ENCOUNTER — Other Ambulatory Visit: Payer: Self-pay

## 2019-08-08 ENCOUNTER — Ambulatory Visit: Payer: Medicare HMO | Admitting: Adult Health

## 2019-08-08 VITALS — BP 136/72 | HR 63 | Temp 98.5°F | Ht 65.0 in | Wt 176.1 lb

## 2019-08-08 DIAGNOSIS — I10 Essential (primary) hypertension: Secondary | ICD-10-CM | POA: Diagnosis not present

## 2019-08-08 DIAGNOSIS — E785 Hyperlipidemia, unspecified: Secondary | ICD-10-CM

## 2019-08-08 DIAGNOSIS — K582 Mixed irritable bowel syndrome: Secondary | ICD-10-CM | POA: Diagnosis not present

## 2019-08-08 DIAGNOSIS — E119 Type 2 diabetes mellitus without complications: Secondary | ICD-10-CM | POA: Diagnosis not present

## 2019-08-08 DIAGNOSIS — F419 Anxiety disorder, unspecified: Secondary | ICD-10-CM

## 2019-08-08 DIAGNOSIS — Z0000 Encounter for general adult medical examination without abnormal findings: Secondary | ICD-10-CM

## 2019-08-08 DIAGNOSIS — R69 Illness, unspecified: Secondary | ICD-10-CM | POA: Diagnosis not present

## 2019-08-08 LAB — POCT UA - MICROALBUMIN
Albumin/Creatinine Ratio, Urine, POC: <30
Creatinine, POC: 100 mg/dL
Microalbumin Ur, POC: 10 mg/L

## 2019-08-08 LAB — POCT GLYCOSYLATED HEMOGLOBIN (HGB A1C): Hemoglobin A1C: 6.2 % — AB (ref 4.0–5.6)

## 2019-08-08 MED ORDER — LORAZEPAM 1 MG PO TABS: ORAL_TABLET | ORAL | 0 refills | Status: DC

## 2019-08-08 MED ORDER — METFORMIN HCL 500 MG PO TABS: 500.0000 mg | ORAL_TABLET | Freq: Every day | ORAL | 1 refills | Status: DC

## 2019-08-08 MED ORDER — VALSARTAN 160 MG PO TABS: ORAL_TABLET | ORAL | 1 refills | Status: DC

## 2019-08-08 NOTE — Assessment & Plan Note (Signed)
[place] Controlled Substance Database reviewed-no aberrancies noted Discussed using Lorazepam VERY sparingly She has been on >5 years, denies SE Denies falls

## 2019-08-08 NOTE — Assessment & Plan Note (Signed)
BP at goal 136/72, HR 63 Continue Carvedilol 3.125mg  BID, Valsaran 160mg  QD

## 2019-08-08 NOTE — Assessment & Plan Note (Signed)
Lab Results  Component Value Date   HGBA1C 6.2 (A) 08/08/2019   HGBA1C 6.3 (A) 01/03/2019   HGBA1C 5.8 10/03/2018  AM BG 100-130s,denies episodes of hypogylcemia She is currently on Metformin 500mg  QD Microalbumin-normal On Valsartan 160mg  QD

## 2019-08-08 NOTE — Assessment & Plan Note (Signed)
Remain well hydrated Probiotic

## 2019-08-08 NOTE — Assessment & Plan Note (Signed)
Vytorin 10-20mg  QD

## 2019-08-08 NOTE — Patient Instructions (Addendum)
Diabetes Mellitus and Nutrition, Adult When you have diabetes (diabetes mellitus), it is very important to have healthy eating habits because your blood sugar (glucose) levels are greatly affected by what you eat and drink. Eating healthy foods in the appropriate amounts, at about the same times every day, can help you:  Control your blood glucose.  Lower your risk of heart disease.  Improve your blood pressure.  Reach or maintain a healthy weight. Every person with diabetes is different, and each person has different needs for a meal plan. Your health care provider may recommend that you work with a diet and nutrition specialist (dietitian) to make a meal plan that is best for you. Your meal plan may vary depending on factors such as:  The calories you need.  The medicines you take.  Your weight.  Your blood glucose, blood pressure, and cholesterol levels.  Your activity level.  Other health conditions you have, such as heart or kidney disease. How do carbohydrates affect me? Carbohydrates, also called carbs, affect your blood glucose level more than any other type of food. Eating carbs naturally raises the amount of glucose in your blood. Carb counting is a method for keeping track of how many carbs you eat. Counting carbs is important to keep your blood glucose at a healthy level, especially if you use insulin or take certain oral diabetes medicines. It is important to know how many carbs you can safely have in each meal. This is different for every person. Your dietitian can help you calculate how many carbs you should have at each meal and for each snack. Foods that contain carbs include:  Bread, cereal, rice, pasta, and crackers.  Potatoes and corn.  Peas, beans, and lentils.  Milk and yogurt.  Fruit and juice.  Desserts, such as cakes, cookies, ice cream, and candy. How does alcohol affect me? Alcohol can cause a sudden decrease in blood glucose (hypoglycemia),  especially if you use insulin or take certain oral diabetes medicines. Hypoglycemia can be a life-threatening condition. Symptoms of hypoglycemia (sleepiness, dizziness, and confusion) are similar to symptoms of having too much alcohol. If your health care provider says that alcohol is safe for you, follow these guidelines:  Limit alcohol intake to no more than 1 drink per day for nonpregnant women and 2 drinks per day for men. One drink equals 12 oz of beer, 5 oz of wine, or 1 oz of hard liquor.  Do not drink on an empty stomach.  Keep yourself hydrated with water, diet soda, or unsweetened iced tea.  Keep in mind that regular soda, juice, and other mixers may contain a lot of sugar and must be counted as carbs. What are tips for following this plan?  Reading food labels  Start by checking the serving size on the "Nutrition Facts" label of packaged foods and drinks. The amount of calories, carbs, fats, and other nutrients listed on the label is based on one serving of the item. Many items contain more than one serving per package.  Check the total grams (g) of carbs in one serving. You can calculate the number of servings of carbs in one serving by dividing the total carbs by 15. For example, if a food has 30 g of total carbs, it would be equal to 2 servings of carbs.  Check the number of grams (g) of saturated and trans fats in one serving. Choose foods that have low or no amount of these fats.  Check the number of   milligrams (mg) of salt (sodium) in one serving. Most people should limit total sodium intake to less than 2,300 mg per day.  Always check the nutrition information of foods labeled as "low-fat" or "nonfat". These foods may be higher in added sugar or refined carbs and should be avoided.  Talk to your dietitian to identify your daily goals for nutrients listed on the label. Shopping  Avoid buying canned, premade, or processed foods. These foods tend to be high in fat, sodium,  and added sugar.  Shop around the outside edge of the grocery store. This includes fresh fruits and vegetables, bulk grains, fresh meats, and fresh dairy. Cooking  Use low-heat cooking methods, such as baking, instead of high-heat cooking methods like deep frying.  Cook using healthy oils, such as olive, canola, or sunflower oil.  Avoid cooking with butter, cream, or high-fat meats. Meal planning  Eat meals and snacks regularly, preferably at the same times every day. Avoid going long periods of time without eating.  Eat foods high in fiber, such as fresh fruits, vegetables, beans, and whole grains. Talk to your dietitian about how many servings of carbs you can eat at each meal.  Eat 4-6 ounces (oz) of lean protein each day, such as lean meat, chicken, fish, eggs, or tofu. One oz of lean protein is equal to: ? 1 oz of meat, chicken, or fish. ? 1 egg. ?  cup of tofu.  Eat some foods each day that contain healthy fats, such as avocado, nuts, seeds, and fish. Lifestyle  Check your blood glucose regularly.  Exercise regularly as told by your health care provider. This may include: ? 150 minutes of moderate-intensity or vigorous-intensity exercise each week. This could be brisk walking, biking, or water aerobics. ? Stretching and doing strength exercises, such as yoga or weightlifting, at least 2 times a week.  Take medicines as told by your health care provider.  Do not use any products that contain nicotine or tobacco, such as cigarettes and e-cigarettes. If you need help quitting, ask your health care provider.  Work with a Social worker or diabetes educator to identify strategies to manage stress and any emotional and social challenges. Questions to ask a health care provider  Do I need to meet with a diabetes educator?  Do I need to meet with a dietitian?  What number can I call if I have questions?  When are the best times to check my blood glucose? Where to find more  information:  American Diabetes Association: diabetes.org  Academy of Nutrition and Dietetics: www.eatright.[employer] of Diabetes and Digestive and Kidney Diseases (NIH): [employer] Summary  A healthy meal plan will help you control your blood glucose and maintain a healthy lifestyle.  Working with a diet and nutrition specialist (dietitian) can help you make a meal plan that is best for you.  Keep in mind that carbohydrates (carbs) and alcohol have immediate effects on your blood glucose levels. It is important to count carbs and to use alcohol carefully. This information is not intended to replace advice given to you by your health care provider. Make sure you discuss any questions you have with your health care provider. Document Released: 08/06/2005 Document Revised: 10/22/2017 Document Reviewed: 12/14/2016 Elsevier Patient Education  2020 [employer].  Blood pressure, weight, and A1c (6.2)- all very good. Continue all medications as directed. Continue to use Lorazepam very sparingly. Remain well hydrated, follow heart healthy diet. In addition to daily walking add in 10  mins of light weight/body weight exercises. Continue daily multi-vitamin. Follow-up in 3 months, please come fasting. Continue to social distance and wear a mask when in public. GREAT TO SEE YOU!

## 2019-08-10 DIAGNOSIS — L570 Actinic keratosis: Secondary | ICD-10-CM | POA: Diagnosis not present

## 2019-08-10 DIAGNOSIS — D0461 Carcinoma in situ of skin of right upper limb, including shoulder: Secondary | ICD-10-CM | POA: Diagnosis not present

## 2019-09-05 DIAGNOSIS — K219 Gastro-esophageal reflux disease without esophagitis: Secondary | ICD-10-CM | POA: Diagnosis not present

## 2019-09-05 DIAGNOSIS — R32 Unspecified urinary incontinence: Secondary | ICD-10-CM | POA: Diagnosis not present

## 2019-09-05 DIAGNOSIS — Z6830 Body mass index (BMI) 30.0-30.9, adult: Secondary | ICD-10-CM | POA: Diagnosis not present

## 2019-09-05 DIAGNOSIS — K589 Irritable bowel syndrome without diarrhea: Secondary | ICD-10-CM | POA: Diagnosis not present

## 2019-09-05 DIAGNOSIS — Z7984 Long term (current) use of oral hypoglycemic drugs: Secondary | ICD-10-CM | POA: Diagnosis not present

## 2019-09-05 DIAGNOSIS — E669 Obesity, unspecified: Secondary | ICD-10-CM | POA: Diagnosis not present

## 2019-09-05 DIAGNOSIS — E119 Type 2 diabetes mellitus without complications: Secondary | ICD-10-CM | POA: Diagnosis not present

## 2019-09-05 DIAGNOSIS — I10 Essential (primary) hypertension: Secondary | ICD-10-CM | POA: Diagnosis not present

## 2019-09-05 DIAGNOSIS — R69 Illness, unspecified: Secondary | ICD-10-CM | POA: Diagnosis not present

## 2019-09-05 DIAGNOSIS — E785 Hyperlipidemia, unspecified: Secondary | ICD-10-CM | POA: Diagnosis not present

## 2019-09-10 ENCOUNTER — Other Ambulatory Visit: Payer: Self-pay | Admitting: Adult Health

## 2019-10-02 DIAGNOSIS — Z23 Encounter for immunization: Secondary | ICD-10-CM | POA: Diagnosis not present

## 2019-10-29 ENCOUNTER — Other Ambulatory Visit: Payer: Self-pay

## 2019-10-29 ENCOUNTER — Emergency Department (HOSPITAL_COMMUNITY)
Admission: EM | Admit: 2019-10-29 | Discharge: 2019-10-29 | Disposition: A | Discharge status: Home / Self Care | Payer: Medicare HMO | Attending: Emergency Medicine | Admitting: Emergency Medicine

## 2019-10-29 ENCOUNTER — Encounter (HOSPITAL_COMMUNITY): Payer: Self-pay

## 2019-10-29 DIAGNOSIS — Z7984 Long term (current) use of oral hypoglycemic drugs: Secondary | ICD-10-CM | POA: Diagnosis not present

## 2019-10-29 DIAGNOSIS — R197 Diarrhea, unspecified: Secondary | ICD-10-CM

## 2019-10-29 DIAGNOSIS — E119 Type 2 diabetes mellitus without complications: Secondary | ICD-10-CM | POA: Diagnosis not present

## 2019-10-29 DIAGNOSIS — Z79899 Other long term (current) drug therapy: Secondary | ICD-10-CM | POA: Insufficient documentation

## 2019-10-29 DIAGNOSIS — Z7982 Long term (current) use of aspirin: Secondary | ICD-10-CM | POA: Insufficient documentation

## 2019-10-29 DIAGNOSIS — Z87891 Personal history of nicotine dependence: Secondary | ICD-10-CM | POA: Diagnosis not present

## 2019-10-29 DIAGNOSIS — I10 Essential (primary) hypertension: Secondary | ICD-10-CM | POA: Insufficient documentation

## 2019-10-29 LAB — COMPREHENSIVE METABOLIC PANEL
ALT: 17 U/L (ref 0–44)
AST: 23 U/L (ref 15–41)
Albumin: 3.8 g/dL (ref 3.5–5.0)
Alkaline Phosphatase: 81 U/L (ref 38–126)
Anion gap: 9 (ref 5–15)
BUN: 14 mg/dL (ref 8–23)
CO2: 23 mmol/L (ref 22–32)
Calcium: 9.4 mg/dL (ref 8.9–10.3)
Chloride: 106 mmol/L (ref 98–111)
Creatinine, Ser: 0.84 mg/dL (ref 0.44–1.00)
GFR calc Af Amer: >60 mL/min (ref >60)
GFR calc non Af Amer: >60 mL/min (ref >60)
Glucose, Bld: 134 mg/dL — ABNORMAL HIGH (ref 70–99)
Potassium: 4.7 mmol/L (ref 3.5–5.1)
Sodium: 138 mmol/L (ref 135–145)
Total Bilirubin: 0.6 mg/dL (ref 0.3–1.2)
Total Protein: 7.8 g/dL (ref 6.5–8.1)

## 2019-10-29 LAB — CBC
HCT: 42.3 % (ref 36.0–46.0)
Hemoglobin: 13.7 g/dL (ref 12.0–15.0)
MCH: 29.9 pg (ref 26.0–34.0)
MCHC: 32.4 g/dL (ref 30.0–36.0)
MCV: 92.4 fL (ref 80.0–100.0)
Platelets: 221 10*3/uL (ref 150–400)
RBC: 4.58 MIL/uL (ref 3.87–5.11)
RDW: 13 % (ref 11.5–15.5)
WBC: 9.1 10*3/uL (ref 4.0–10.5)
nRBC: 0 % (ref 0.0–0.2)

## 2019-10-29 LAB — LIPASE, BLOOD: Lipase: 30 U/L (ref 11–51)

## 2019-10-29 MED ORDER — SODIUM CHLORIDE 0.9% FLUSH: 3.0000 mL | Freq: Once | INTRAVENOUS | Status: DC

## 2019-10-29 MED ORDER — ONDANSETRON 8 MG PO TBDP
8.0000 mg | ORAL_TABLET | Freq: Once | ORAL | Status: AC
  Administered 2019-10-29: 8 mg via ORAL
  Filled 2019-10-29: qty 1

## 2019-10-29 NOTE — ED Notes (Signed)
Nurse gave patient a cup of water

## 2019-10-29 NOTE — ED Triage Notes (Signed)
Pt presents with c/o diarrhea for 2 days. Pt reports since this morning around 2 am, she has been going every 15-20 mins. Pt reports she does have a hx of e-coli in her stool.

## 2019-10-29 NOTE — ED Provider Notes (Signed)
[facility] DEPT Provider Note   CSN: [id number] Arrival date & time: 10/29/19  1217     History   Chief Complaint Chief Complaint  Patient presents with  . Diarrhea    HPI Aveah Piercefield is a 83 y.o. female.     Patient c/o diarrhea for past day. Symptoms acute onset, moderate, episodic, recurrent, approximately every 20-30 minutes last night, watery, not bloody. Had single episode emesis this AM, not bloody or bilious. No abd pain. Denies dysuria. Is urinating today, ?sl less than normal. No fever or chills. No known bad food ingestion or ill contacts. No recent new meds or recent antibiotic use.  Denies faintness/dizziness. No chest pain or discomfort. No sob. Patient in waiting room for past few hours, states symptoms improving, no episodes diarrhea or emesis while waiting in ED.   The history is provided by the patient.  Diarrhea Associated symptoms: no abdominal pain, no chills, no fever and no headaches     Past Medical History:  Diagnosis Date  . Anxiety   . Colon polyps   . Diabetes mellitus without complication ([place])   . Diverticulosis   . Food poisoning   . Gallstones   . GERD (gastroesophageal reflux disease)   . Hyperlipidemia   . Hypertension   . Obesity   . 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 Encounter for Medicare annual wellness exam 04/04/2019  . Chronic thoracic back pain 01/03/2019  . Acute congestive heart failure ([place])   . Non-ST elevation (NSTEMI) myocardial infarction ([place])   . Acute diastolic (congestive) heart failure ([place]) 07/02/2018  . Elevated troponin 07/02/2018  . Diabetes mellitus without complication ([place]) 0000000  . Diverticulitis-  mild symptoms 03/30/2018  . Irritable bowel syndrome with both constipation and diarrhea 03/30/2018  . Healthcare maintenance 03/17/2018  . Pain of right  heel 07/06/2017  . Loose stools 06/24/2017  . Right-sided thoracic back pain 06/24/2017  . Hyperlipidemia 04/22/2017  . GERD (gastroesophageal reflux disease) 04/22/2017  . Impaired glucose metabolism 04/22/2017  . Essential hypertension 04/22/2017  . Anxiety 04/22/2017    Past Surgical History:  Procedure Laterality Date  . ABDOMINAL HYSTERECTOMY     total  . BREAST BIOPSY Left    neg  . CHOLECYSTECTOMY    . JOINT REPLACEMENT Bilateral    knee  . LEFT HEART CATH AND CORONARY ANGIOGRAPHY N/A 07/04/2018   Procedure: LEFT HEART CATH AND CORONARY ANGIOGRAPHY;  Surgeon: Jettie Booze, MD;  Location: [facility];  Service: Cardiovascular;  Laterality: N/A;  . REPLACEMENT TOTAL KNEE BILATERAL       OB History   No obstetric history on file.      Home Medications    Prior to Admission medications   Medication Sig Start Date End Date Taking? Authorizing Provider  acetaminophen (TYLENOL) 500 MG tablet Take 1,000 mg by mouth every 6 (six) hours as needed for moderate pain.    [provider]  aspirin EC 81 MG tablet Take 81 mg by mouth daily.    [provider]  carvedilol (COREG) 3.125 MG tablet Take 1 tablet (3.125 mg total) by mouth 2 (two) times daily with a meal. 04/21/19   Hilty, Nadean Corwin, MD  dicyclomine (BENTYL) 10 MG capsule Take 10 mg by mouth 4 (four) times daily -  before meals and at bedtime.  [provider]  ezetimibe-simvastatin (VYTORIN) 10-20 MG tablet TAKE 1 TABLET DAILY 09/11/19   Danford, [name] D, NP  LORazepam (ATIVAN) 1 MG tablet 1/2 tablet daily as needed for anxiety 08/08/19   Mina Marble D, NP  metFORMIN (GLUCOPHAGE) 500 MG tablet Take 1 tablet (500 mg total) by mouth daily with breakfast. 08/08/19   Danford, [name] D, NP  metoCLOPramide (REGLAN) 5 MG tablet Take 1 tablet (5 mg total) by mouth every 8 (eight) hours as needed for nausea. 02/16/19   Danford, [name] D, NP  omeprazole (PRILOSEC OTC) 20 MG tablet Take 20 mg by  mouth daily.    [provider]  valsartan (DIOVAN) 160 MG tablet Take one tablet by mouth daily 08/08/19   Danford, Berna Spare, NP    Family History Family History  Problem Relation Age of Onset  . Hypertension Mother   . Colon cancer Mother   . Stroke Father   . Heart failure Father   . Stroke Sister   . Stroke Brother   . Graves' disease Daughter   . Breast cancer Paternal Aunt   . Graves' disease Sister   . Bone cancer Sister   . Breast cancer Maternal Aunt   . Esophageal cancer Neg Hx   . Rectal cancer Neg Hx   . Liver cancer Neg Hx     Social History Social History   Tobacco Use  . Smoking status: Former Smoker    Packs/day: 1.00    Years: 2.00    Pack years: 2.00    Quit date: 11/24/1959    Years since quitting: 59.9  . Smokeless tobacco: Never Used  Substance Use Topics  . Alcohol use: No  . Drug use: No     Allergies   Patient has no known allergies.   Review of Systems Review of Systems  Constitutional: Negative for chills and fever.  HENT: Negative for sore throat.   Eyes: Negative for redness.  Respiratory: Negative for cough and shortness of breath.   Cardiovascular: Negative for chest pain.  Gastrointestinal: Positive for diarrhea and nausea. Negative for abdominal pain.  Endocrine: Negative for polyuria.  Genitourinary: Negative for dysuria and flank pain.  Musculoskeletal: Negative for back pain and neck pain.  Skin: Negative for rash.  Neurological: Negative for light-headedness and headaches.  Hematological: Does not bruise/bleed easily.  Psychiatric/Behavioral: Negative for confusion.     Physical Exam Updated Vital Signs BP 133/65 (BP Location: Left Arm)   Pulse 74   Temp 98 F (36.7 C) (Oral)   Resp 18   SpO2 98%   Physical Exam Vitals signs and nursing note reviewed.  Constitutional:      Appearance: Normal appearance. She is well-developed.  HENT:     Head: Atraumatic.     Nose: Nose normal.     Mouth/Throat:      Mouth: Mucous membranes are moist.  Eyes:     General: No scleral icterus.    Conjunctiva/sclera: Conjunctivae normal.     Pupils: Pupils are equal, round, and reactive to light.  Neck:     Musculoskeletal: Normal range of motion and neck supple. No neck rigidity or muscular tenderness.     Trachea: No tracheal deviation.  Cardiovascular:     Rate and Rhythm: Normal rate and regular rhythm.     Pulses: Normal pulses.     Heart sounds: Normal heart sounds. No murmur. No friction rub. No gallop.   Pulmonary:     Effort: Pulmonary effort is  normal. No respiratory distress.     Breath sounds: Normal breath sounds.  Abdominal:     General: Bowel sounds are normal. There is no distension.     Palpations: Abdomen is soft. There is no mass.     Tenderness: There is no abdominal tenderness. There is no guarding or rebound.     Hernia: No hernia is present.  Genitourinary:    Comments: No cva tenderness.  Musculoskeletal:        General: No swelling.  Skin:    General: Skin is warm and dry.     Findings: No rash.  Neurological:     Mental Status: She is alert.     Comments: Alert, speech normal. Steady gait.   Psychiatric:        Mood and Affect: Mood normal.      ED Treatments / Results  Labs (all labs ordered are listed, but only abnormal results are displayed) Results for orders placed or performed during the hospital encounter of 10/29/19  Lipase, blood  Result Value Ref Range   Lipase 30 11 - 51 U/L  Comprehensive metabolic panel  Result Value Ref Range   Sodium 138 135 - 145 mmol/L   Potassium 4.7 3.5 - 5.1 mmol/L   Chloride 106 98 - 111 mmol/L   CO2 23 22 - 32 mmol/L   Glucose, Bld 134 (H) 70 - 99 mg/dL   BUN 14 8 - 23 mg/dL   Creatinine, Ser 0.84 0.44 - 1.00 mg/dL   Calcium 9.4 8.9 - 10.3 mg/dL   Total Protein 7.8 6.5 - 8.1 g/dL   Albumin 3.8 3.5 - 5.0 g/dL   AST 23 15 - 41 U/L   ALT 17 0 - 44 U/L   Alkaline Phosphatase 81 38 - 126 U/L   Total Bilirubin 0.6 0.3  - 1.2 mg/dL   GFR calc non Af Amer >60 >60 mL/min   GFR calc Af Amer >60 >60 mL/min   Anion gap 9 5 - 15  CBC  Result Value Ref Range   WBC 9.1 4.0 - 10.5 K/uL   RBC 4.58 3.87 - 5.11 MIL/uL   Hemoglobin 13.7 12.0 - 15.0 g/dL   HCT 42.3 36.0 - 46.0 %   MCV 92.4 80.0 - 100.0 fL   MCH 29.9 26.0 - 34.0 pg   MCHC 32.4 30.0 - 36.0 g/dL   RDW 13.0 11.5 - 15.5 %   Platelets 221 150 - 400 K/uL   nRBC 0.0 0.0 - 0.2 %    EKG None  Radiology No results found.  Procedures Procedures (including critical care time)  Medications Ordered in ED Medications  sodium chloride flush (NS) 0.9 % injection 3 mL (has no administration in time range)  ondansetron (ZOFRAN-ODT) disintegrating tablet 8 mg (has no administration in time range)     Initial Impression / Assessment and Plan / ED Course  I have reviewed the triage vital signs and the nursing notes.  Pertinent labs & imaging results that were available during my care of the patient were reviewed by me and considered in my medical decision making (see chart for details).  Labs sent.   Reviewed nursing notes and prior charts for additional history.   zofran po. Trial of po fluids.   Labs reviewed/interpreted by me - chem normal, renal fxn normal.  For past few hours, no diarrhea or emesis. Is tolerating po fluids. Vitals normal. abd soft nt. Pt appears stable for d/c.  Return precautions provided.  Final Clinical Impressions(s) / ED Diagnoses   Final diagnoses:  None    ED Discharge Orders    None       Lajean Saver, MD 10/29/19 1611

## 2019-10-29 NOTE — ED Notes (Signed)
Save blue in main lab 

## 2019-10-29 NOTE — Discharge Instructions (Addendum)
It was our pleasure to provide your ER care today - we hope that you feel better.  Rest. Drink plenty of fluids.   Follow up with primary care doctor in the next couple days for recheck if symptoms fail to improve/resolve.  Return to ER if worse, new symptoms, fevers, new or severe pain, persistent vomiting, severe diarrhea, faint/dizzy, or other concern.

## 2019-11-06 NOTE — Progress Notes (Signed)
Subjective:    Patient ID: Kelli Churn, female    DOB: Oct 16, 1936, 83 y.o.   MRN: [id number]  HPI:  08/08/2019 OV:  Ms. Loaiza is here for regular f/u: AM BG 100-130s,denies episodes of hypogylcemia She is currently on Metformin 500mg  QD She denies any diarrhea, reports "rabbit pellet type BMs" She has dx of IBS- C/D Recent Labs       Lab Results  Component Value Date   HGBA1C 6.2 (A) 08/08/2019   HGBA1C 6.3 (A) 01/03/2019   HGBA1C 5.8 10/03/2018     She walks daily in her yard, denies any other "formal exercise". She reports difficulty standing up straight, esp. When  "trying to put away dishes". 09/2018 DEXA- ASSESSMENT: The BMD measured at AP Spine L1-L4 (L3) is 1.088 g/cm2 with a T-score of -0.8. This patient is considered normal according to [facility]) criteria. The quality of the scan is good. L-3 was excluded due to (degenerative changes.  Discussed optimizing Vit D and Calcium intake and increasing weight bearing exercise. She denies any recent falls.  She reports overall increase in anxiety levels r/t pandemic [place] Controlled Substance Database reviewed-no aberrancies noted Discussed using Lorazepam VERY sparingly She has been on >5 years, denies SE Denies falls She denies any ETOH use  11/07/2019 OV: Ms. Febles presents for 3 month f/u: T2D. HTN, Anxiety- she estimates to use 1/2 tablet of Lorazepam 1mg  most days of the week. She denies over-sedation, recent falls, decrease in memory. Reviewed PDMP- she is requesting refills Q2M. She is on controlled substance contract. She declined starting a SSRI to replace Lorazepam, she also declined referral to BH/Psychology. She denies SI/HI  A1c is 5.8- GREAT! AM BG 110-120s, with one reading in 90 and one in 150 Lab Results  Component Value Date   HGBA1C 5.8 (A) 11/07/2019   HGBA1C 6.2 (A) 08/08/2019   HGBA1C 6.3 (A) 01/03/2019    She reports increase in diarrhea with  IBS. She was seen in ED 10/29/2019 for diarrhea- treated with anti-emetics, d/c'd home stable. She has not followed-up with her established GI- advised to do so. Will provide one final refill on Bentyl- future refills need to come from her GI.  She denies acute cardiac sx's, remains active with house/yard work.  She reports chronic thoracic back pain that makes it difficult for her to stand up straight. She was in PT early 2020, that was halted due to pandemic shut down.  10/12/2018 DEXA The BMD measured at AP Spine L1-L4 (L3) is 1.088 g/cm2 with a T-score of -0.8. This patient is considered normal according to [facility]) criteria. The quality of the scan is good. L-3 was excluded due to (degenerative changes.  She would like to be evaluated by [facility] - her husband is treated there. Patient Care Team    Relationship Specialty Notifications Start End  Mina Marble D, NP PCP - General Family Medicine  04/22/17   Pixie Casino, MD PCP - Cardiology Cardiology Admissions 04/21/19     Patient Active Problem List   Diagnosis Date Noted  . Encounter for Medicare annual wellness exam 04/04/2019  . Chronic thoracic back pain 01/03/2019  . Acute congestive heart failure ([place])   . Non-ST elevation (NSTEMI) myocardial infarction ([place])   . Acute diastolic (congestive) heart failure ([place]) 07/02/2018  . Elevated troponin 07/02/2018  . Diabetes mellitus without complication ([place]) 0000000  . Diverticulitis-  mild symptoms 03/30/2018  . Irritable bowel syndrome with  both constipation and diarrhea 03/30/2018  . Healthcare maintenance 03/17/2018  . Pain of right heel 07/06/2017  . Loose stools 06/24/2017  . Right-sided thoracic back pain 06/24/2017  . Hyperlipidemia 04/22/2017  . GERD (gastroesophageal reflux disease) 04/22/2017  . Impaired glucose metabolism 04/22/2017  . Essential hypertension 04/22/2017  . Anxiety 04/22/2017     Past Medical History:   Diagnosis Date  . Anxiety   . Colon polyps   . Diabetes mellitus without complication ([place])   . Diverticulosis   . Food poisoning   . Gallstones   . GERD (gastroesophageal reflux disease)   . Hyperlipidemia   . Hypertension   . Obesity   . 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 ABDOMINAL HYSTERECTOMY     total  . BREAST BIOPSY Left    neg  . CHOLECYSTECTOMY    . JOINT REPLACEMENT Bilateral    knee  . LEFT HEART CATH AND CORONARY ANGIOGRAPHY N/A 07/04/2018   Procedure: LEFT HEART CATH AND CORONARY ANGIOGRAPHY;  Surgeon: Jettie Booze, MD;  Location: [facility];  Service: Cardiovascular;  Laterality: N/A;  . REPLACEMENT TOTAL KNEE BILATERAL       Family History  Problem Relation Age of Onset  . Hypertension Mother   . Colon cancer Mother   . Stroke Father   . Heart failure Father   . Stroke Sister   . Stroke Brother   . Graves' disease Daughter   . Breast cancer Paternal Aunt   . Graves' disease Sister   . Bone cancer Sister   . Breast cancer Maternal Aunt   . Esophageal cancer Neg Hx   . Rectal cancer Neg Hx   . Liver cancer Neg Hx      Social History   Substance and Sexual Activity  Drug Use No     Social History   Substance and Sexual Activity  Alcohol Use No     Social History   Tobacco Use  Smoking Status Former Smoker  . Packs/day: 1.00  . Years: 2.00  . Pack years: 2.00  . Quit date: 11/24/1959  . Years since quitting: 59.9  Smokeless Tobacco Never Used     Outpatient Encounter Medications as of 11/07/2019  Medication Sig  . acetaminophen (TYLENOL) 500 MG tablet Take 1,000 mg by mouth every 6 (six) hours as needed for moderate pain.  Marland Kitchen aspirin EC 81 MG tablet Take 81 mg by mouth daily.  . carvedilol (COREG) 3.125 MG tablet Take 1 tablet (3.125 mg total) by mouth 2 (two) times daily with a meal.  .  dicyclomine (BENTYL) 10 MG capsule Take 1 capsule (10 mg total) by mouth 3 (three) times daily as needed for spasms (IBS flareup). Future refills need to come from GI  . docusate sodium (COLACE) 100 MG capsule Take 100 mg by mouth daily.  Marland Kitchen ezetimibe-simvastatin (VYTORIN) 10-20 MG tablet TAKE 1 TABLET DAILY (Patient taking differently: Take 1 tablet by mouth daily. )  . LORazepam (ATIVAN) 1 MG tablet 1/2 tablet daily as needed for anxiety  . metFORMIN (GLUCOPHAGE) 500 MG tablet 1/2 tablet with breakfast  . metoCLOPramide (REGLAN) 5 MG tablet Take 1 tablet (5 mg total) by mouth every 8 (eight) hours as needed for nausea.  . Multiple Vitamins-Minerals (MULTI FOR HER 50+) TABS Take 1 tablet by mouth daily.  Marland Kitchen omeprazole (PRILOSEC OTC)  20 MG tablet Take 20 mg by mouth daily.   . valsartan (DIOVAN) 160 MG tablet Take one tablet by mouth daily (Patient taking differently: Take 160 mg by mouth daily. )  . [DISCONTINUED] dicyclomine (BENTYL) 10 MG capsule Take 10 mg by mouth 3 (three) times daily as needed for spasms (IBS flareup).   . [DISCONTINUED] LORazepam (ATIVAN) 1 MG tablet 1/2 tablet daily as needed for anxiety (Patient taking differently: Take 0.5-1 mg by mouth daily as needed for anxiety. )  . [DISCONTINUED] metFORMIN (GLUCOPHAGE) 500 MG tablet Take 1 tablet (500 mg total) by mouth daily with breakfast.   No facility-administered encounter medications on file as of 11/07/2019.    Allergies: Tape  Body mass index is 29.47 kg/m.  Blood pressure (!) 143/74, pulse 67, temperature 98.1 F (36.7 C), temperature source Oral, height 5\' 5"  (1.651 m), weight 177 lb 1.6 oz (80.3 kg), SpO2 100 %.     Review of Systems  Constitutional: Positive for fatigue. Negative for activity change, appetite change, chills, diaphoresis, fever and unexpected weight change.  Eyes: Negative for visual disturbance.  Respiratory: Negative for cough, chest tightness, shortness of breath, wheezing and stridor.    Cardiovascular: Negative for chest pain, palpitations and leg swelling.  Gastrointestinal: Negative for abdominal distention, abdominal pain, blood in stool, constipation, diarrhea, nausea and vomiting.       Recent occurrence of diarrhea- none at present   Endocrine: Negative for polydipsia, polyphagia and polyuria.  Genitourinary: Negative for difficulty urinating, flank pain and hematuria.  Musculoskeletal: Positive for arthralgias, back pain and gait problem. Negative for myalgias.  Neurological: Negative for dizziness and headaches.  Hematological: Negative for adenopathy. Does not bruise/bleed easily.  Psychiatric/Behavioral: Negative for agitation, behavioral problems, confusion, decreased concentration, dysphoric mood, hallucinations, self-injury, sleep disturbance and suicidal ideas. The patient is nervous/anxious. The patient is not hyperactive.        Objective:   Physical Exam Vitals and nursing note reviewed.  Constitutional:      General: She is not in acute distress.    Appearance: Normal appearance. She is not ill-appearing, toxic-appearing or diaphoretic.  HENT:     Head: Normocephalic and atraumatic.  Eyes:     Extraocular Movements: Extraocular movements intact.     Conjunctiva/sclera: Conjunctivae normal.     Pupils: Pupils are equal, round, and reactive to light.  Cardiovascular:     Rate and Rhythm: Normal rate and regular rhythm.     Pulses: Normal pulses.     Heart sounds: Normal heart sounds. No murmur. No friction rub. No gallop.   Pulmonary:     Effort: Pulmonary effort is normal. No respiratory distress.     Breath sounds: Normal breath sounds. No stridor. No wheezing, rhonchi or rales.  Chest:     Chest wall: No tenderness.  Musculoskeletal:     Thoracic back: No tenderness.     Lumbar back: No tenderness.     Comments: Thoracic kyphosis noted   Skin:    General: Skin is warm.     Capillary Refill: Capillary refill takes less than 2 seconds.   Neurological:     Mental Status: She is alert and oriented to person, place, and time.  Psychiatric:        Mood and Affect: Mood normal.        Behavior: Behavior normal.        Thought Content: Thought content normal.        Judgment: Judgment normal.  Assessment & Plan:   1. Diabetes mellitus without complication ([place])   2. Healthcare maintenance   3. Hyperlipidemia, unspecified hyperlipidemia type   4. Essential hypertension   5. Chronic thoracic back pain, unspecified back pain laterality   6. Irritable bowel syndrome with both constipation and diarrhea   7. Anxiety     Healthcare maintenance A1c is 5.8- GREAT! Reduce Metformin 500mg  to 1/2 tablet with breakfast daily. If Continue to check fasting blood glucose each morning and if consistently >140 or ever <80- call clinic. Follow-up with Gastroenterology (GI), re: increasing IBS symptoms- short refill of Bentyl provided until you are seen by GI. Remain well hydrated, follow diabetic diet. Continue follow-up with Cardiology as directed.  Referral to [facility] placed. We will call you with lab results. Continue to social distance and wear a mask when in public.  Hyperlipidemia Lipid Panel drawn today Currently on Vytorin 10/20mg  QD  Irritable bowel syndrome with both constipation and diarrhea Short refill of Bentyl provided- future RX from her established GI. Advised to f/u with her GI due to increasing IBS sx's  Essential hypertension Followed by Cards Currently on Carvedilol 3.215 mg BID, Valsartan 160mg  QD  Diabetes mellitus without complication ([facility]) Lab Results  Component Value Date   HGBA1C 5.8 (A) 11/07/2019   HGBA1C 6.2 (A) 08/08/2019   HGBA1C 6.3 (A) 01/03/2019  A1c is 5.8- GREAT! AM BG 110-120s, with one reading in 90 and one in 150 Reduce Metformin 500mg  to 1/2 tablet with breakfast daily. If Continue to check fasting blood glucose each morning and if consistently >140 or ever <80-  call clinic.    Anxiety She estimates to use 1/2 tablet of Lorazepam 1mg  most days of the week. She denies over-sedation, recent falls, decrease in memory. Reviewed PDMP- she is requesting refills Q2M. She is on controlled substance contract. She declined starting a SSRI to replace Lorazepam, she also declined referral to BH/Psychology. She denies SI/HI  Chronic thoracic back pain She reports chronic thoracic back pain that makes it difficult for her to stand up straight. She was in PT early 2020, that was halted due to pandemic shut down.  10/12/2018 DEXA The BMD measured at AP Spine L1-L4 (L3) is 1.088 g/cm2 with a T-score of -0.8. This patient is considered normal according to [facility]) criteria. The quality of the scan is good. L-3 was excluded due to (degenerative changes.  She would like to be evaluated by [facility] - her husband is treated there.    FOLLOW-UP:  Return in about 3 months (around 02/05/2020) for Regular Follow Up, HTN, Diabetes, Hypercholestermia, Anxiety.

## 2019-11-07 ENCOUNTER — Ambulatory Visit: Payer: Medicare HMO | Admitting: Adult Health

## 2019-11-07 ENCOUNTER — Other Ambulatory Visit: Payer: Self-pay

## 2019-11-07 ENCOUNTER — Encounter: Payer: Self-pay | Admitting: Adult Health

## 2019-11-07 VITALS — BP 143/74 | HR 67 | Temp 98.1°F | Ht 65.0 in | Wt 177.1 lb

## 2019-11-07 DIAGNOSIS — G8929 Other chronic pain: Secondary | ICD-10-CM

## 2019-11-07 DIAGNOSIS — M546 Pain in thoracic spine: Secondary | ICD-10-CM | POA: Diagnosis not present

## 2019-11-07 DIAGNOSIS — R69 Illness, unspecified: Secondary | ICD-10-CM | POA: Diagnosis not present

## 2019-11-07 DIAGNOSIS — E785 Hyperlipidemia, unspecified: Secondary | ICD-10-CM | POA: Diagnosis not present

## 2019-11-07 DIAGNOSIS — E119 Type 2 diabetes mellitus without complications: Secondary | ICD-10-CM

## 2019-11-07 DIAGNOSIS — Z0000 Encounter for general adult medical examination without abnormal findings: Secondary | ICD-10-CM

## 2019-11-07 DIAGNOSIS — K582 Mixed irritable bowel syndrome: Secondary | ICD-10-CM | POA: Diagnosis not present

## 2019-11-07 DIAGNOSIS — I10 Essential (primary) hypertension: Secondary | ICD-10-CM

## 2019-11-07 DIAGNOSIS — F419 Anxiety disorder, unspecified: Secondary | ICD-10-CM

## 2019-11-07 LAB — POCT GLYCOSYLATED HEMOGLOBIN (HGB A1C): Hemoglobin A1C: 5.8 % — AB (ref 4.0–5.6)

## 2019-11-07 MED ORDER — METFORMIN HCL 500 MG PO TABS: ORAL_TABLET | ORAL | 0 refills | Status: DC

## 2019-11-07 MED ORDER — LORAZEPAM 1 MG PO TABS: ORAL_TABLET | ORAL | 0 refills | Status: DC

## 2019-11-07 MED ORDER — DICYCLOMINE HCL 10 MG PO CAPS: 10.0000 mg | ORAL_CAPSULE | Freq: Three times a day (TID) | ORAL | 0 refills | Status: DC | PRN

## 2019-11-07 NOTE — Assessment & Plan Note (Signed)
Lipid Panel drawn today Currently on Vytorin 10/20mg  QD

## 2019-11-07 NOTE — Assessment & Plan Note (Signed)
She reports chronic thoracic back pain that makes it difficult for her to stand up straight. She was in PT early 2020, that was halted due to pandemic shut down.  10/12/2018 DEXA The BMD measured at AP Spine L1-L4 (L3) is 1.088 g/cm2 with a T-score of -0.8. This patient is considered normal according to [facility]) criteria. The quality of the scan is good. L-3 was excluded due to (degenerative changes.  She would like to be evaluated by [facility] - her husband is treated there.

## 2019-11-07 NOTE — Assessment & Plan Note (Signed)
Short refill of Bentyl provided- future RX from her established GI. Advised to f/u with her GI due to increasing IBS sx's

## 2019-11-07 NOTE — Assessment & Plan Note (Signed)
She estimates to use 1/2 tablet of Lorazepam 1mg  most days of the week. She denies over-sedation, recent falls, decrease in memory. Reviewed PDMP- she is requesting refills Q2M. She is on controlled substance contract. She declined starting a SSRI to replace Lorazepam, she also declined referral to BH/Psychology. She denies SI/HI

## 2019-11-07 NOTE — Assessment & Plan Note (Signed)
A1c is 5.8- GREAT! Reduce Metformin 500mg  to 1/2 tablet with breakfast daily. If Continue to check fasting blood glucose each morning and if consistently >140 or ever <80- call clinic. Follow-up with Gastroenterology (GI), re: increasing IBS symptoms- short refill of Bentyl provided until you are seen by GI. Remain well hydrated, follow diabetic diet. Continue follow-up with Cardiology as directed.  Referral to [facility] placed. We will call you with lab results. Continue to social distance and wear a mask when in public.

## 2019-11-07 NOTE — Assessment & Plan Note (Addendum)
Lab Results  Component Value Date   HGBA1C 5.8 (A) 11/07/2019   HGBA1C 6.2 (A) 08/08/2019   HGBA1C 6.3 (A) 01/03/2019  A1c is 5.8- GREAT! AM BG 110-120s, with one reading in 90 and one in 150 Reduce Metformin 500mg  to 1/2 tablet with breakfast daily. If Continue to check fasting blood glucose each morning and if consistently >140 or ever <80- call clinic.

## 2019-11-07 NOTE — Patient Instructions (Addendum)
Diabetes Mellitus and Nutrition, Adult When you have diabetes (diabetes mellitus), it is very important to have healthy eating habits because your blood sugar (glucose) levels are greatly affected by what you eat and drink. Eating healthy foods in the appropriate amounts, at about the same times every day, can help you:  Control your blood glucose.  Lower your risk of heart disease.  Improve your blood pressure.  Reach or maintain a healthy weight. Every person with diabetes is different, and each person has different needs for a meal plan. Your health care provider may recommend that you work with a diet and nutrition specialist (dietitian) to make a meal plan that is best for you. Your meal plan may vary depending on factors such as:  The calories you need.  The medicines you take.  Your weight.  Your blood glucose, blood pressure, and cholesterol levels.  Your activity level.  Other health conditions you have, such as heart or kidney disease. How do carbohydrates affect me? Carbohydrates, also called carbs, affect your blood glucose level more than any other type of food. Eating carbs naturally raises the amount of glucose in your blood. Carb counting is a method for keeping track of how many carbs you eat. Counting carbs is important to keep your blood glucose at a healthy level, especially if you use insulin or take certain oral diabetes medicines. It is important to know how many carbs you can safely have in each meal. This is different for every person. Your dietitian can help you calculate how many carbs you should have at each meal and for each snack. Foods that contain carbs include:  Bread, cereal, rice, pasta, and crackers.  Potatoes and corn.  Peas, beans, and lentils.  Milk and yogurt.  Fruit and juice.  Desserts, such as cakes, cookies, ice cream, and candy. How does alcohol affect me? Alcohol can cause a sudden decrease in blood glucose (hypoglycemia),  especially if you use insulin or take certain oral diabetes medicines. Hypoglycemia can be a life-threatening condition. Symptoms of hypoglycemia (sleepiness, dizziness, and confusion) are similar to symptoms of having too much alcohol. If your health care provider says that alcohol is safe for you, follow these guidelines:  Limit alcohol intake to no more than 1 drink per day for nonpregnant women and 2 drinks per day for men. One drink equals 12 oz of beer, 5 oz of wine, or 1 oz of hard liquor.  Do not drink on an empty stomach.  Keep yourself hydrated with water, diet soda, or unsweetened iced tea.  Keep in mind that regular soda, juice, and other mixers may contain a lot of sugar and must be counted as carbs. What are tips for following this plan?  Reading food labels  Start by checking the serving size on the "Nutrition Facts" label of packaged foods and drinks. The amount of calories, carbs, fats, and other nutrients listed on the label is based on one serving of the item. Many items contain more than one serving per package.  Check the total grams (g) of carbs in one serving. You can calculate the number of servings of carbs in one serving by dividing the total carbs by 15. For example, if a food has 30 g of total carbs, it would be equal to 2 servings of carbs.  Check the number of grams (g) of saturated and trans fats in one serving. Choose foods that have low or no amount of these fats.  Check the number of   milligrams (mg) of salt (sodium) in one serving. Most people should limit total sodium intake to less than 2,300 mg per day.  Always check the nutrition information of foods labeled as "low-fat" or "nonfat". These foods may be higher in added sugar or refined carbs and should be avoided.  Talk to your dietitian to identify your daily goals for nutrients listed on the label. Shopping  Avoid buying canned, premade, or processed foods. These foods tend to be high in fat, sodium,  and added sugar.  Shop around the outside edge of the grocery store. This includes fresh fruits and vegetables, bulk grains, fresh meats, and fresh dairy. Cooking  Use low-heat cooking methods, such as baking, instead of high-heat cooking methods like deep frying.  Cook using healthy oils, such as olive, canola, or sunflower oil.  Avoid cooking with butter, cream, or high-fat meats. Meal planning  Eat meals and snacks regularly, preferably at the same times every day. Avoid going long periods of time without eating.  Eat foods high in fiber, such as fresh fruits, vegetables, beans, and whole grains. Talk to your dietitian about how many servings of carbs you can eat at each meal.  Eat 4-6 ounces (oz) of lean protein each day, such as lean meat, chicken, fish, eggs, or tofu. One oz of lean protein is equal to: ? 1 oz of meat, chicken, or fish. ? 1 egg. ?  cup of tofu.  Eat some foods each day that contain healthy fats, such as avocado, nuts, seeds, and fish. Lifestyle  Check your blood glucose regularly.  Exercise regularly as told by your health care provider. This may include: ? 150 minutes of moderate-intensity or vigorous-intensity exercise each week. This could be brisk walking, biking, or water aerobics. ? Stretching and doing strength exercises, such as yoga or weightlifting, at least 2 times a week.  Take medicines as told by your health care provider.  Do not use any products that contain nicotine or tobacco, such as cigarettes and e-cigarettes. If you need help quitting, ask your health care provider.  Work with a Social worker or diabetes educator to identify strategies to manage stress and any emotional and social challenges. Questions to ask a health care provider  Do I need to meet with a diabetes educator?  Do I need to meet with a dietitian?  What number can I call if I have questions?  When are the best times to check my blood glucose? Where to find more  information:  American Diabetes Association: diabetes.org  Academy of Nutrition and Dietetics: www.eatright.[employer] of Diabetes and Digestive and Kidney Diseases (NIH): [employer] Summary  A healthy meal plan will help you control your blood glucose and maintain a healthy lifestyle.  Working with a diet and nutrition specialist (dietitian) can help you make a meal plan that is best for you.  Keep in mind that carbohydrates (carbs) and alcohol have immediate effects on your blood glucose levels. It is important to count carbs and to use alcohol carefully. This information is not intended to replace advice given to you by your health care provider. Make sure you discuss any questions you have with your health care provider. Document Released: 08/06/2005 Document Revised: 10/22/2017 Document Reviewed: 12/14/2016 Elsevier Patient Education  2020 [place].  A1c is 5.8- GREAT! Reduce Metformin 500mg  to 1/2 tablet with breakfast daily. If Continue to check fasting blood glucose each morning and if consistently >140 or ever <80- call clinic. Follow-up with Gastroenterology (GI),  re: increasing IBS symptoms- short refill of Bentyl provided until you are seen by GI. Remain well hydrated, follow diabetic diet. Continue follow-up with Cardiology as directed.  Referral to [facility] placed. We will call you with lab results. Continue to social distance and wear a mask when in public. GREAT TO SEE YOU!

## 2019-11-07 NOTE — Assessment & Plan Note (Addendum)
Followed by Cards Currently on Carvedilol 3.215 mg BID, Valsartan 160mg  QD

## 2019-11-08 LAB — COMPREHENSIVE METABOLIC PANEL
ALT: 13 IU/L (ref 0–32)
AST: 22 IU/L (ref 0–40)
Albumin/Globulin Ratio: 1.3 (ref 1.2–2.2)
Albumin: 4 g/dL (ref 3.6–4.6)
Alkaline Phosphatase: 88 IU/L (ref 39–117)
BUN/Creatinine Ratio: 14 (ref 12–28)
BUN: 11 mg/dL (ref 8–27)
Bilirubin Total: 0.4 mg/dL (ref 0.0–1.2)
CO2: 24 mmol/L (ref 20–29)
Calcium: 9.4 mg/dL (ref 8.7–10.3)
Chloride: 104 mmol/L (ref 96–106)
Creatinine, Ser: 0.8 mg/dL (ref 0.57–1.00)
GFR calc Af Amer: 80 mL/min/{1.73_m2} (ref >59)
GFR calc non Af Amer: 69 mL/min/{1.73_m2} (ref >59)
Globulin, Total: 3.1 g/dL (ref 1.5–4.5)
Glucose: 120 mg/dL — ABNORMAL HIGH (ref 65–99)
Potassium: 4.6 mmol/L (ref 3.5–5.2)
Sodium: 138 mmol/L (ref 134–144)
Total Protein: 7.1 g/dL (ref 6.0–8.5)

## 2019-11-08 LAB — CBC WITH DIFFERENTIAL/PLATELET
Basophils Absolute: 0.1 10*3/uL (ref 0.0–0.2)
Basos: 1 %
EOS (ABSOLUTE): 0.1 10*3/uL (ref 0.0–0.4)
Eos: 1 %
Hematocrit: 38.8 % (ref 34.0–46.6)
Hemoglobin: 13.1 g/dL (ref 11.1–15.9)
Immature Grans (Abs): 0 10*3/uL (ref 0.0–0.1)
Immature Granulocytes: 0 %
Lymphocytes Absolute: 1.4 10*3/uL (ref 0.7–3.1)
Lymphs: 20 %
MCH: 30 pg (ref 26.6–33.0)
MCHC: 33.8 g/dL (ref 31.5–35.7)
MCV: 89 fL (ref 79–97)
Monocytes Absolute: 0.5 10*3/uL (ref 0.1–0.9)
Monocytes: 8 %
Neutrophils Absolute: 4.8 10*3/uL (ref 1.4–7.0)
Neutrophils: 70 %
Platelets: 191 10*3/uL (ref 150–450)
RBC: 4.36 x10E6/uL (ref 3.77–5.28)
RDW: 12.7 % (ref 11.7–15.4)
WBC: 6.9 10*3/uL (ref 3.4–10.8)

## 2019-11-08 LAB — HEMOGLOBIN A1C
Est. average glucose Bld gHb Est-mCnc: 126 mg/dL
Hgb A1c MFr Bld: 6 % — ABNORMAL HIGH (ref 4.8–5.6)

## 2019-11-08 LAB — LIPID PANEL
Chol/HDL Ratio: 3.6 ratio (ref 0.0–4.4)
Cholesterol, Total: 102 mg/dL (ref 100–199)
HDL: 28 mg/dL — ABNORMAL LOW (ref >39)
LDL Chol Calc (NIH): 50 mg/dL (ref 0–99)
Triglycerides: 133 mg/dL (ref 0–149)
VLDL Cholesterol Cal: 24 mg/dL (ref 5–40)

## 2019-11-08 LAB — TSH: TSH: 1.62 u[IU]/mL (ref 0.450–4.500)

## 2019-11-08 LAB — MAGNESIUM: Magnesium: 1.8 mg/dL (ref 1.6–2.3)

## 2019-11-09 ENCOUNTER — Other Ambulatory Visit: Payer: Self-pay

## 2019-11-09 DIAGNOSIS — D485 Neoplasm of uncertain behavior of skin: Secondary | ICD-10-CM | POA: Diagnosis not present

## 2019-11-09 DIAGNOSIS — L820 Inflamed seborrheic keratosis: Secondary | ICD-10-CM | POA: Diagnosis not present

## 2019-11-09 DIAGNOSIS — H61039 Chondritis of external ear, unspecified ear: Secondary | ICD-10-CM | POA: Diagnosis not present

## 2019-11-10 DIAGNOSIS — M5136 Other intervertebral disc degeneration, lumbar region: Secondary | ICD-10-CM | POA: Diagnosis not present

## 2019-11-27 ENCOUNTER — Telehealth: Payer: Self-pay | Admitting: Adult Health

## 2019-11-27 NOTE — Telephone Encounter (Signed)
Patient is requesting to speak with clinic staff about imm records. She received a notice from [facility] that she was overdue for her Prevar-65. I told patient that I'm aware of the 13 and 23 but not that 65. Forwarding message to clinic staff to make sure patient is up to date on all necessary immunizations.

## 2019-11-27 NOTE — Telephone Encounter (Signed)
Advised patient that she had received bother prevnar 13s and a pneumococcal 23 and gave her the dates. Patient is aware she is UTD on pneumococcal vaccines. AS, CMA

## 2019-12-10 ENCOUNTER — Other Ambulatory Visit: Payer: Self-pay | Admitting: Adult Health

## 2019-12-25 ENCOUNTER — Encounter: Payer: Self-pay | Admitting: Gastroenterology

## 2019-12-25 ENCOUNTER — Ambulatory Visit: Payer: Medicare HMO | Admitting: Gastroenterology

## 2019-12-25 ENCOUNTER — Other Ambulatory Visit: Payer: Self-pay

## 2019-12-25 VITALS — BP 130/80 | HR 68 | Temp 97.4°F | Ht 65.0 in | Wt 176.0 lb

## 2019-12-25 DIAGNOSIS — K529 Noninfective gastroenteritis and colitis, unspecified: Secondary | ICD-10-CM | POA: Diagnosis not present

## 2019-12-25 DIAGNOSIS — Z01818 Encounter for other preprocedural examination: Secondary | ICD-10-CM

## 2019-12-25 DIAGNOSIS — R140 Abdominal distension (gaseous): Secondary | ICD-10-CM

## 2019-12-25 DIAGNOSIS — R634 Abnormal weight loss: Secondary | ICD-10-CM

## 2019-12-25 MED ORDER — NA SULFATE-K SULFATE-MG SULF 17.5-3.13-1.6 GM/177ML PO SOLN: 1.0000 | Freq: Once | ORAL | 0 refills | Status: AC

## 2019-12-25 NOTE — Progress Notes (Signed)
[facility] Progress Note  Chief Complaint: Chronic diarrhea  Subjective  History: Episodic diarrhea over few years.  Last seen in office Aug 2019 (E coli infection prior to that).  Last colonoscopy 2018, no Bx b/c no diarrhea at that time. In ED 11/01/19 with acute diarrhea, no stool studies done. Saw PCP 11/07/19, refilled dicyclomine.  Andrea Brooks is bothered by ongoing difficulty with her bowel habits.  At times she will have constipation for couple of days with small passage of pellet-like stool, but is most bothered by episodes that occur nearly weekly, consisting of urgency with passage of multiple loose watery bowel movements, lasting 6 or 8 hours.  For whatever reason, it is often heralded by some belching and a feeling of metallic taste in her mouth, and often occurs in the evening.  She is kept a detailed food diary over the last couple of weeks but not noticed any clear or consistent triggers.  She denies rectal bleeding and denies abdominal pain or cramps.  Sometimes there is bloating along with the diarrhea. She believes she has lost about 60 pounds in the last few years due to the symptoms. ROS: Cardiovascular:  no chest pain Respiratory: no dyspnea Anxiety Feels she has lost muscle strength in her abdominal wall, has tried physical therapy and also an abdominal binder.  Remainder systems negative except as above The patient's Past Medical, Family and Social History were reviewed and are on file in the EMR.  Objective:  Med list reviewed  Current Outpatient Medications:  .  acetaminophen (TYLENOL) 500 MG tablet, Take 1,000 mg by mouth every 6 (six) hours as needed for moderate pain., Disp: , Rfl:  .  aspirin EC 81 MG tablet, Take 81 mg by mouth daily., Disp: , Rfl:  .  carvedilol (COREG) 3.125 MG tablet, Take 1 tablet (3.125 mg total) by mouth 2 (two) times daily with a meal., Disp: 180 tablet, Rfl: 3 .  dicyclomine (BENTYL) 10 MG capsule, Take 1 capsule (10 mg  total) by mouth 3 (three) times daily as needed for spasms (IBS flareup). Future refills need to come from GI, Disp: 30 capsule, Rfl: 0 .  ezetimibe-simvastatin (VYTORIN) 10-20 MG tablet, TAKE 1 TABLET DAILY, Disp: 90 tablet, Rfl: 0 .  LORazepam (ATIVAN) 1 MG tablet, 1/2 tablet daily as needed for anxiety, Disp: 30 tablet, Rfl: 0 .  metFORMIN (GLUCOPHAGE) 500 MG tablet, 1/2 tablet with breakfast, Disp: 90 tablet, Rfl: 0 .  Multiple Vitamins-Minerals (MULTI FOR HER 50+) TABS, Take 1 tablet by mouth daily., Disp: , Rfl:  .  omeprazole (PRILOSEC OTC) 20 MG tablet, Take 20 mg by mouth daily. , Disp: , Rfl:  .  valsartan (DIOVAN) 160 MG tablet, Take one tablet by mouth daily (Patient taking differently: Take 160 mg by mouth daily. ), Disp: 90 tablet, Rfl: 1 .  metoCLOPramide (REGLAN) 5 MG tablet, Take 1 tablet (5 mg total) by mouth every 8 (eight) hours as needed for nausea. (Patient not taking: Reported on 12/25/2019), Disp: 12 tablet, Rfl: 0   Vital signs in last 24 hrs: Vitals:   12/25/19 1355  BP: 130/80  Pulse: 68  Temp: (!) 97.4 F (36.3 C)    Physical Exam  Well-appearing  HEENT: sclera anicteric, oral mucosa moist without lesions  Neck: supple, no thyromegaly, JVD or lymphadenopathy  Cardiac: RRR without murmurs, S1S2 heard, no peripheral edema  Pulm: clear to auscultation bilaterally, normal RR and effort noted  Abdomen: soft, no tenderness, with active  bowel sounds. No guarding or palpable hepatosplenomegaly.  RUQ open chole scar  Skin; warm and dry, no jaundice or rash  Recent Labs:  CBC Latest Ref Rng & Units 11/07/2019 10/29/2019 09/08/2018  WBC 3.4 - 10.8 x10E3/uL 6.9 9.1 11.7(H)  Hemoglobin 11.1 - 15.9 g/dL 13.1 13.7 13.9  Hematocrit 34.0 - 46.6 % 38.8 42.3 44.2  Platelets 150 - 450 x10E3/uL 191 221 239   CMP Latest Ref Rng & Units 11/07/2019 10/29/2019 09/08/2018  Glucose 65 - 99 mg/dL 120(H) 134(H) 174(H)  BUN 8 - 27 mg/dL 11 14 16   Creatinine 0.57 - 1.00 mg/dL  0.80 0.84 0.93  Sodium 134 - 144 mmol/L 138 138 140  Potassium 3.5 - 5.2 mmol/L 4.6 4.7 3.7  Chloride 96 - 106 mmol/L 104 106 102  CO2 20 - 29 mmol/L 24 23 25   Calcium 8.7 - 10.3 mg/dL 9.4 9.4 9.8  Total Protein 6.0 - 8.5 g/dL 7.1 7.8 8.5(H)  Total Bilirubin 0.0 - 1.2 mg/dL 0.4 0.6 1.0  Alkaline Phos 39 - 117 IU/L 88 81 65  AST 0 - 40 IU/L 22 23 24   ALT 0 - 32 IU/L 13 17 17   Albumin 4.0  @ASSESSMENTPLANBEGIN @ Assessment: Encounter Diagnoses  Name Primary?  . Chronic diarrhea Yes  . Weight loss   . Abdominal bloating    Overall, I am still most suspicious of a functional bowel disorder, perhaps triggered by or worsened by her previous E. coli infection.  Nevertheless, escalating symptoms warrant further testing Reported weight loss, though with normal albumin. Unusual for celiac to come on at this age, no clear risk factors for SIBO.  Plan: EGD and colonoscopy, biopsies duodenum and colon will be taken. She was agreeable after discussion of procedure and risks.  The benefits and risks of the planned procedure were described in detail with the patient or (when appropriate) their health care proxy.  Risks were outlined as including, but not limited to, bleeding, infection, perforation, adverse medication reaction leading to cardiac or pulmonary decompensation, pancreatitis (if ERCP).  The limitation of incomplete mucosal visualization was also discussed.  No guarantees or warranties were given.  Some written dietary advice was given  She typically only takes the dicyclomine when she has abdominal cramps, but I encouraged her to try it a bit more often when she has these episodes of diarrhea.   30 minutes were spent on this encounter (including chart review, history/exam, counseling/coordination of care, and documentation)  Nelida Meuse III

## 2019-12-25 NOTE — Patient Instructions (Addendum)
If you are age 82 or older, your body mass index should be between 23-30. Your Body mass index is 29.29 kg/m. If this is out of the aforementioned range listed, please consider follow up with your Primary Care Provider.  If you are age 9 or younger, your body mass index should be between 19-25. Your Body mass index is 29.29 kg/m. If this is out of the aformentioned range listed, please consider follow up with your Primary Care Provider.   You have been scheduled for an endoscopy and colonoscopy. Please follow the written instructions given to you at your visit today. Please pick up your prep supplies at the pharmacy within the next 1-3 days. If you use inhalers (even only as needed), please bring them with you on the day of your procedure. Your physician has requested that you go to www.startemmi.com and enter the access code given to you at your visit today. This web site gives a general overview about your procedure. However, you should still follow specific instructions given to you by our office regarding your preparation for the procedure.  Food Guidelines for those with chronic digestive trouble:  Many people have difficulty digesting certain foods, causing a variety of distressing and embarrassing symptoms such as abdominal pain, bloating and gas.  These foods may need to be avoided or consumed in small amounts.  Here are some tips that might be helpful for you.  1.   Lactose intolerance is the difficulty or complete inability to digest lactose, the natural sugar in milk and anything made from milk.  This condition is harmless, common, and can begin any time during life.  Some people can digest a modest amount of lactose while others cannot tolerate any.  Also, not all dairy products contain equal amounts of lactose.  For example, hard cheeses such as parmesan have less lactose than soft cheeses such as cheddar.  Yogurt has less lactose than milk or cheese.  Many packaged foods (even many  brands of bread) have milk, so read ingredient lists carefully.  It is difficult to test for lactose intolerance, so just try avoiding lactose as much as possible for a week and see what happens with your symptoms.  If you seem to be lactose intolerant, the best plan is to avoid it (but make sure you get calcium from another source).  The next best thing is to use lactase enzyme supplements, available over the counter everywhere.  Just know that many lactose intolerant people need to take several tablets with each serving of dairy to avoid symptoms.  Lastly, a lot of restaurant food is made with milk or butter.  Many are things you might not suspect, such as mashed potatoes, rice and pasta (cooked with butter) and "grilled" items.  If you are lactose intolerant, it never hurts to ask your server what has milk or butter.  2.   Fiber is an important part of your diet, but not all fiber is well-tolerated.  Insoluble fiber such as bran is often consumed by normal gut bacteria and converted into gas.  Soluble fiber such as oats, squash, carrots and green beans are typically tolerated better.  3.   Some types of carbohydrates can be poorly digested.  Examples include: fructose (apples, cherries, pears, raisins and other dried fruits), fructans (onions, zucchini, large amounts of wheat), sorbitol/mannitol/xylitol and sucralose/Splenda (common artificial sweeteners), and raffinose (lentils, broccoli, cabbage, asparagus, brussel sprouts, many types of beans).  Do a Development worker, community for [employer] and you  will find helpful information. Beano, a dietary supplement, will often help with raffinose-containing foods.  As with lactase tablets, you may need several per serving.  4.   Whenever possible, avoid processed food&meats and chemical additives.  High fructose corn syrup, a common sweetener, may be difficult to digest.  Eggs and soy (comes from the soybean, and added to many foods now) are other common bloating/gassy  foods.  5.  Regarding gluten:  gluten is a protein mainly found in wheat, but also rye and barley.  There is a condition called celiac sprue, which is an inflammatory reaction in the small intestine causing a variety of digestive symptoms.  Blood testing is highly reliable to look for this condition, and sometimes upper endoscopy with small bowel biopsies may be necessary to make the diagnosis.  Many patients who test negative for celiac sprue report improvement in their digestive symptoms when they switch to a gluten-free diet.  However, in these "non-celiac gluten sensitive" patients, the true role of gluten in their symptoms is unclear.  Reducing carbohydrates in general may decrease the gas and bloating caused when gut bacteria consume carbs. Also, some of these patients may actually be intolerant of the baker's yeast in bread products rather than the gluten.  Flatbread and other reduced yeast breads might therefore be tolerated.  There is no specific testing available for most food intolerances, which are discovered mainly by dietary elimination.  Please do not embark on a gluten free diet unless directed by your doctor, as it is highly restrictive, and may lead to nutritional deficiencies if not carefully monitored.  Lastly, beware of internet claims offering "personalized" tests for food intolerances.  Such testing has no reliable scientific evidence to support its reliability and correlation to symptoms.    6.  The best advice is old advice, especially for those with chronic digestive trouble - try to eat "clean".  Balanced diet, avoid processed food, plenty of fruits and vegetables, cut down the sugar, minimal alcohol, avoid tobacco. Make time to care for yourself, get enough sleep, exercise when you can, reduce stress.  Your guts will thank you for it.   - Dr. [facility]  It was a pleasure to see you today!  Dr. Loletha Carrow

## 2020-01-16 ENCOUNTER — Ambulatory Visit (INDEPENDENT_AMBULATORY_CARE_PROVIDER_SITE_OTHER): Payer: Medicare HMO

## 2020-01-16 ENCOUNTER — Other Ambulatory Visit: Payer: Self-pay | Admitting: Gastroenterology

## 2020-01-16 DIAGNOSIS — Z1159 Encounter for screening for other viral diseases: Secondary | ICD-10-CM

## 2020-01-16 LAB — SARS CORONAVIRUS 2 (TAT 6-24 HRS): SARS Coronavirus 2: NEGATIVE

## 2020-01-18 ENCOUNTER — Ambulatory Visit (AMBULATORY_SURGERY_CENTER): Payer: Medicare HMO | Admitting: Gastroenterology

## 2020-01-18 ENCOUNTER — Encounter: Payer: Self-pay | Admitting: Gastroenterology

## 2020-01-18 ENCOUNTER — Other Ambulatory Visit: Payer: Self-pay

## 2020-01-18 VITALS — BP 127/41 | HR 59 | Temp 96.8°F | Resp 14 | Ht 65.0 in | Wt 176.0 lb

## 2020-01-18 DIAGNOSIS — K529 Noninfective gastroenteritis and colitis, unspecified: Secondary | ICD-10-CM | POA: Diagnosis present

## 2020-01-18 DIAGNOSIS — R634 Abnormal weight loss: Secondary | ICD-10-CM | POA: Diagnosis not present

## 2020-01-18 DIAGNOSIS — D123 Benign neoplasm of transverse colon: Secondary | ICD-10-CM

## 2020-01-18 DIAGNOSIS — K449 Diaphragmatic hernia without obstruction or gangrene: Secondary | ICD-10-CM

## 2020-01-18 DIAGNOSIS — R140 Abdominal distension (gaseous): Secondary | ICD-10-CM

## 2020-01-18 MED ORDER — SODIUM CHLORIDE 0.9 % IV SOLN: 500.0000 mL | Freq: Once | INTRAVENOUS | Status: DC

## 2020-01-18 NOTE — Op Note (Signed)
[facility] Patient Name: Andrea Brooks Procedure Date: 01/18/2020 2:08 PM MRN: [id number] Endoscopist: Mallie Mussel L. Loletha Carrow , MD Age: 84 Referring MD:  Date of Birth: 1936/10/24 Gender: Female Account #: [id number] Procedure:                Upper GI endoscopy Indications:              Diarrhea, Weight loss Medicines:                Monitored Anesthesia Care Procedure:                Pre-Anesthesia Assessment:                           - Prior to the procedure, a History and Physical                            was performed, and patient medications and                            allergies were reviewed. The patient's tolerance of                            previous anesthesia was also reviewed. The risks                            and benefits of the procedure and the sedation                            options and risks were discussed with the patient.                            All questions were answered, and informed consent                            was obtained. Prior Anticoagulants: The patient has                            taken no previous anticoagulant or antiplatelet                            agents. ASA Grade Assessment: II - A patient with                            mild systemic disease. After reviewing the risks                            and benefits, the patient was deemed in                            satisfactory condition to undergo the procedure.                           - Prior to the procedure, a History and Physical  was performed, and patient medications and                            allergies were reviewed. The patient's tolerance of                            previous anesthesia was also reviewed. The risks                            and benefits of the procedure and the sedation                            options and risks were discussed with the patient.                            All questions were answered, and informed  consent                            was obtained. Prior Anticoagulants: The patient has                            taken no previous anticoagulant or antiplatelet                            agents. ASA Grade Assessment: II - A patient with                            mild systemic disease. After reviewing the risks                            and benefits, the patient was deemed in                            satisfactory condition to undergo the procedure.                           After obtaining informed consent, the endoscope was                            passed under direct vision. Throughout the                            procedure, the patient's blood pressure, pulse, and                            oxygen saturations were monitored continuously. The                            Endoscope was introduced through the mouth, and                            advanced to the second part of duodenum. The upper  GI endoscopy was accomplished without difficulty.                            The patient tolerated the procedure well. Scope In: Scope Out: Findings:                 A small hiatal hernia was present.                           The exam of the esophagus was otherwise normal.                           The stomach was normal.                           The cardia and gastric fundus were normal on                            retroflexion.                           The examined duodenum was normal. Biopsies for                            histology were taken with a cold forceps for                            evaluation of celiac disease. Complications:            No immediate complications. Estimated Blood Loss:     Estimated blood loss was minimal. Impression:               - Small hiatal hernia.                           - Normal stomach.                           - Normal examined duodenum. Biopsied. Recommendation:           - Patient has a contact number  available for                            emergencies. The signs and symptoms of potential                            delayed complications were discussed with the                            patient. Return to normal activities tomorrow.                            Written discharge instructions were provided to the                            patient.                           -  Resume previous diet.                           - Continue present medications.                           - See the other procedure note for documentation of                            additional impression and recommendations.                           - Await pathology results. Chares Slaymaker L. Loletha Carrow, MD 01/18/2020 2:49:34 PM This report has been signed electronically.

## 2020-01-18 NOTE — Op Note (Signed)
[facility] Patient Name: Andrea Brooks Procedure Date: 01/18/2020 2:03 PM MRN: [id number] Endoscopist: [place]. Loletha Carrow , MD Age: 84 Referring MD:  Date of Birth: 1935/12/02 Gender: Female Account #: [id number] Procedure:                Colonoscopy Indications:              Lower abdominal pain, Constipation (primarily),                            Chronic diarrhea (episodic), Weight loss                            (documented weight in chart down 7 pounds since Aug                            2019)                           patient reported that bowel prep did not begin                            working until early AM of procedure day Medicines:                Monitored Anesthesia Care Procedure:                Pre-Anesthesia Assessment:                           - Prior to the procedure, a History and Physical                            was performed, and patient medications and                            allergies were reviewed. The patient's tolerance of                            previous anesthesia was also reviewed. The risks                            and benefits of the procedure and the sedation                            options and risks were discussed with the patient.                            All questions were answered, and informed consent                            was obtained. Prior Anticoagulants: The patient has                            taken no previous anticoagulant or antiplatelet  agents. ASA Grade Assessment: II - A patient with                            mild systemic disease. After reviewing the risks                            and benefits, the patient was deemed in                            satisfactory condition to undergo the procedure.                           After obtaining informed consent, the colonoscope                            was passed under direct vision. Throughout the   procedure, the patient's blood pressure, pulse, and                            oxygen saturations were monitored continuously. The                            Colonoscope was introduced through the anus and                            advanced to the the terminal ileum, with                            identification of the appendiceal orifice and IC                            valve. The colonoscopy was somewhat difficult due                            to multiple diverticula in the colon and a tortuous                            colon. Successful completion of the procedure was                            aided by using manual pressure. The patient                            tolerated the procedure well. The quality of the                            bowel preparation was good. The terminal ileum,                            ileocecal valve, appendiceal orifice, and rectum                            were photographed. Scope In: 2:18:18 PM Scope Out: 2:36:00 PM Scope Withdrawal Time:  0 hours 10 minutes 56 seconds  Total Procedure Duration: 0 hours 17 minutes 42 seconds  Findings:                 The perianal and digital rectal examinations were                            normal.                           The terminal ileum appeared normal.                           Multiple diverticula were found in the sigmoid                            colon. There was associated haustral thickening and                            tortuosity.                           A diminutive polyp was found in the transverse                            colon. The polyp was sessile. The polyp was removed                            with a cold biopsy forceps. Resection and retrieval                            were complete.                           Normal mucosa was found in the entire colon.                            Biopsies for histology were taken with a cold                            forceps from the right colon  and left colon for                            evaluation of microscopic colitis.                           External hemorrhoids were found.                           The exam was otherwise without abnormality on                            direct and retroflexion views. Complications:            No immediate complications. Estimated Blood Loss:     Estimated blood loss was minimal. Impression:               -  The examined portion of the ileum was normal.                           - Diverticulosis in the sigmoid colon.                           - One diminutive polyp in the transverse colon,                            removed with a cold biopsy forceps. Resected and                            retrieved.                           - Normal mucosa in the entire examined colon.                            Biopsied.                           - External hemorrhoids.                           - The examination was otherwise normal on direct                            and retroflexion views.                           Main problem appears to be chronic constipation                            related to divertculosis and possibly pelvic floor                            dysfunction as well (based on patient's reported                            symptoms). If biopsies negative for microscopic,                            then suspicion is that constipation escalates to                            the point of episodic catharsis withurgency and                            loose stool. Recommendation:           - Patient has a contact number available for                            emergencies. The signs and symptoms of potential  delayed complications were discussed with the                            patient. Return to normal activities tomorrow.                            Written discharge instructions were provided to the                            patient.                            - Resume previous diet.                           - Continue present medications.                           - Await pathology results.                           - See the other procedure note for documentation of                            additional recommendations.                           - Use Benefiber one tablespoon in large glass of                            water daily. Jisselle Poth L. Loletha Carrow, MD 01/18/2020 2:47:27 PM This report has been signed electronically.

## 2020-01-18 NOTE — Patient Instructions (Addendum)
Handout given:  Hemorrhoids, polyps Resume previous diet Continue present medications: Use benefiber one tablespoon in 16 oz water daily    OU HAD AN ENDOSCOPIC PROCEDURE TODAY AT [place]:   Refer to the procedure report that was given to you for any specific questions about what was found during the examination.  If the procedure report does not answer your questions, please call your gastroenterologist to clarify.  If you requested that your care partner not be given the details of your procedure findings, then the procedure report has been included in a sealed envelope for you to review at your convenience later.  YOU SHOULD EXPECT: Some feelings of bloating in the abdomen. Passage of more gas than usual.  Walking can help get rid of the air that was put into your GI tract during the procedure and reduce the bloating. If you had a lower endoscopy (such as a colonoscopy or flexible sigmoidoscopy) you may notice spotting of blood in your stool or on the toilet paper. If you underwent a bowel prep for your procedure, you may not have a normal bowel movement for a few days.  Please Note:  You might notice some irritation and congestion in your nose or some drainage.  This is from the oxygen used during your procedure.  There is no need for concern and it should clear up in a day or so.  SYMPTOMS TO REPORT IMMEDIATELY:   Following lower endoscopy (colonoscopy or flexible sigmoidoscopy):  Excessive amounts of blood in the stool  Significant tenderness or worsening of abdominal pains  Swelling of the abdomen that is new, acute  Fever of 100F or higher   Following upper endoscopy (EGD)  Vomiting of blood or coffee ground material  New chest pain or pain under the shoulder blades  Painful or persistently difficult swallowing  New shortness of breath  Fever of 100F or higher  Black, tarry-looking stools  For urgent or emergent issues, a gastroenterologist can be reached  at any hour by calling [phone].   DIET:  We do recommend a small meal at first, but then you may proceed to your regular diet.  Drink plenty of fluids but you should avoid alcoholic beverages for 24 hours.  ACTIVITY:  You should plan to take it easy for the rest of today and you should NOT DRIVE or use heavy machinery until tomorrow (because of the sedation medicines used during the test).    FOLLOW UP: Our staff will call the number listed on your records 48-72 hours following your procedure to check on you and address any questions or concerns that you may have regarding the information given to you following your procedure. If we do not reach you, we will leave a message.  We will attempt to reach you two times.  During this call, we will ask if you have developed any symptoms of COVID 19. If you develop any symptoms (ie: fever, flu-like symptoms, shortness of breath, cough etc.) before then, please call [phone].  If you test positive for Covid 19 in the 2 weeks post procedure, please call and report this information to [place].    If any biopsies were taken you will be contacted by phone or by letter within the next 1-3 weeks.  Please call us at [phone] if you have not heard about the biopsies in 3 weeks.    SIGNATURES/CONFIDENTIALITY: You and/or your care partner have signed paperwork which will be entered into your electronic medical  record.  These signatures attest to the fact that that the information above on your After Visit Summary has been reviewed and is understood.  Full responsibility of the confidentiality of this discharge information lies with you and/or your care-partner. 

## 2020-01-18 NOTE — Progress Notes (Signed)
PT taken to PACU. Monitors in place. VSS. Report given to RN. 

## 2020-01-18 NOTE — Progress Notes (Signed)
Called to room to assist during endoscopic procedure.  Patient ID and intended procedure confirmed with present staff. Received instructions for my participation in the procedure from the performing physician.  

## 2020-01-18 NOTE — Progress Notes (Signed)
Pt's states no medical or surgical changes since previsit or office visit. 

## 2020-01-18 NOTE — Progress Notes (Signed)
Temp LC Vitals CW   

## 2020-01-21 ENCOUNTER — Ambulatory Visit: Payer: Medicare HMO | Attending: Internal Medicine

## 2020-01-21 DIAGNOSIS — Z23 Encounter for immunization: Secondary | ICD-10-CM | POA: Insufficient documentation

## 2020-01-21 NOTE — Progress Notes (Signed)
   Covid-19 Vaccination Clinic  Name:  Andrea Brooks    MRN: [id number] DOB: 04/10/1936  01/21/2020  Andrea Brooks was observed post Covid-19 immunization for 15 minutes without incidence. She was provided with Vaccine Information Sheet and instruction to access the V-Safe system.   Andrea Brooks was instructed to call 911 with any severe reactions post vaccine: Marland Kitchen Difficulty breathing  . Swelling of your face and throat  . A fast heartbeat  . A bad rash all over your body  . Dizziness and weakness    Immunizations Administered    Name Date Dose VIS Date Route   Pfizer COVID-19 Vaccine 01/21/2020 10:15 AM 0.3 mL 11/03/2019 Intramuscular   Manufacturer: [place]   Lot: [id number]   [place]: [id number]

## 2020-01-22 ENCOUNTER — Telehealth: Payer: Self-pay | Admitting: *Deleted

## 2020-01-22 NOTE — Telephone Encounter (Signed)
Follow call made. No answer. Left message

## 2020-01-22 NOTE — Telephone Encounter (Signed)
Second follow up call made, no answer 

## 2020-02-07 ENCOUNTER — Ambulatory Visit: Payer: Medicare HMO | Admitting: Adult Health

## 2020-02-12 ENCOUNTER — Ambulatory Visit: Payer: Medicare HMO | Attending: Internal Medicine

## 2020-02-12 DIAGNOSIS — Z23 Encounter for immunization: Secondary | ICD-10-CM

## 2020-02-12 NOTE — Progress Notes (Signed)
   Covid-19 Vaccination Clinic  Name:  Andrea Brooks    MRN: [id number] DOB: [birth year]  02/12/2020  Andrea Brooks was observed post Covid-19 immunization for 15 minutes without incident. She was provided with Vaccine Information Sheet and instruction to access the V-Safe system.   Andrea Brooks was instructed to call 911 with any severe reactions post vaccine: Marland Kitchen Difficulty breathing  . Swelling of face and throat  . A fast heartbeat  . A bad rash all over body  . Dizziness and weakness   Immunizations Administered    Name Date Dose VIS Date Route   Pfizer COVID-19 Vaccine 02/12/2020 10:32 AM 0.3 mL 11/03/2019 Intramuscular   Manufacturer: [place]   Lot: [id number]   [place]: [id number]

## 2020-02-14 ENCOUNTER — Encounter: Payer: Self-pay | Admitting: Family Medicine

## 2020-02-14 ENCOUNTER — Ambulatory Visit (INDEPENDENT_AMBULATORY_CARE_PROVIDER_SITE_OTHER): Payer: Medicare HMO | Admitting: Family Medicine

## 2020-02-14 ENCOUNTER — Other Ambulatory Visit: Payer: Self-pay

## 2020-02-14 VITALS — BP 137/75 | HR 63 | Temp 98.0°F | Resp 12 | Ht 64.0 in | Wt 179.3 lb

## 2020-02-14 DIAGNOSIS — E118 Type 2 diabetes mellitus with unspecified complications: Secondary | ICD-10-CM

## 2020-02-14 DIAGNOSIS — I10 Essential (primary) hypertension: Secondary | ICD-10-CM

## 2020-02-14 DIAGNOSIS — F419 Anxiety disorder, unspecified: Secondary | ICD-10-CM | POA: Diagnosis not present

## 2020-02-14 DIAGNOSIS — I152 Hypertension secondary to endocrine disorders: Secondary | ICD-10-CM

## 2020-02-14 DIAGNOSIS — Z79899 Other long term (current) drug therapy: Secondary | ICD-10-CM | POA: Diagnosis not present

## 2020-02-14 DIAGNOSIS — E1159 Type 2 diabetes mellitus with other circulatory complications: Secondary | ICD-10-CM | POA: Diagnosis not present

## 2020-02-14 LAB — POCT GLYCOSYLATED HEMOGLOBIN (HGB A1C): Hemoglobin A1C: 5.9 % — AB (ref 4.0–5.6)

## 2020-02-14 NOTE — Patient Instructions (Signed)

## 2020-02-14 NOTE — Progress Notes (Signed)
Impression and Recommendations:    1. Diabetes mellitus type 2 with complications ([place])   2. Hypertension associated with diabetes ([place])   3. Anxiety   4. High risk medications (not anticoagulants) long-term use-  BENZO's      - Last OV with Mina Marble NP was 11/07/2019.    Diabetes mellitus type 2 with complications - A6T today is 5.9, down from 6.0 three months ago, up from 5.8 six months ago. - Discussed that goal A1c for a patient of her age is less than 8.0.  - Discontinue metformin today due to pt's A1c, age and comorbidities.  - Advised patient to check her blood sugar more regularly for a while after discontinuing metformin, about 2-3 times per week instead of her established once weekly.  - Counseled patient on pathophysiology of disease and discussed various treatment options, which always includes dietary and lifestyle modification as first line.    - Importance of low carb, heart-healthy diet discussed with patient in addition to regular aerobic exercise of 27mn 5d/week or more.   - Check FBS first thing in the morning, and sugars 2 hours after the biggest meal of your day.  Keep log and bring in next OV for my review.    - Also told patient if you ever feel poorly, please check your blood pressure and blood sugar, as one or the other could be the cause of your symptoms.  - Pt reminded about need for yearly eye and foot exams.  Told patient to make appt.for diabetic eye exam, CMAs here will do foot exams  - We will continue to monitor and re-check A1c as discussed.    Hypertension associated with diabetes - Blood pressure currently is stable, at goal. - Patient will continue current treatment regimen.  See med list.  - Counseled patient on pathophysiology of disease and discussed various treatment options, which always includes dietary and lifestyle modification as first line.   - Lifestyle changes such as dash and heart healthy diets and engaging in a  regular exercise program discussed extensively with patient.   - Ambulatory blood pressure monitoring encouraged at least 3 times weekly.  Keep log and bring in every office visit.  Reminded patient that if they ever feel poorly in any way, to check their blood pressure and pulse.  - We will continue to monitor.    Anxiety - High risk medications (not anticoagulants) long-term use - BENZO's - Reviewed controlled substance contract for patient's management on lorazepam.  No aberrancies found  -Patient does not request refill today from me  - Patient receives lorazepam every two months.    -Patient has declined SSRI historically with previous PCP.  [facility] declined.  - Continue management as established. - Will continue to monitor.     Orders Placed This Encounter  Procedures   POCT glycosylated hemoglobin (Hb A1C)    Medications Discontinued During This Encounter  Medication Reason   metFORMIN (GLUCOPHAGE) 500 MG tablet       Please see AVS handed out to patient at the end of our visit for further patient instructions/ counseling done pertaining to today's office visit.   Return for DM, HTN, HLD follow up every 343mostopped 1/2 tab 50052metformin q am).     Note:  This note was prepared with assistance of Dragon voice recognition software. Occasional wrong-word or sound-a-like substitutions may have occurred due to the inherent limitations of voice recognition software.  The [place] was signed into law in 2016 which includes the topic of electronic health records.  This provides immediate access to information in MyChart.  This includes consultation notes, operative notes, office notes, lab results and pathology reports.  If you have any questions about what you read please let us know at your next visit or call us at the office.  We are right here with you.  This case required medical decision making of at  least moderate complexity.  This document serves as a record of services personally performed by Mellody Dance, DO. It was created on her behalf by Toni Amend, a trained medical scribe. The creation of this record is based on the scribe's personal observations and the provider's statements to them.    The above documentation from Toni Amend, medical scribe, has been reviewed by Marjory Sneddon, D.O.       --------------------------------------------------------------------------------------------------------------------------------------------------------------------------------------------------------------------------------------------    Subjective:     Phillips Odor, am serving as scribe for Dr.Meliton Samad.  HPI: Andrea Brooks is a 84 y.o. female who presents to [place] [facility] today for issues as discussed below.  Notes she finally got her COVID-19 vaccination.  - Difficulty with weakness while bending over Says "the only thing new I have is bending over; I can't straighten up."  Says when she stands in certain positions, she feels fine.  "But when I try to take a breath, I can't do it."  Says when she tries to walk straight, "I can't catch my breath good."  Says that these symptoms have been going on for at least 4-5 months.  She did not have a workup done for this in the recent past.  Denies chest pain or SOB while walking.  Denies difficulty breathing while lying flat.  Notes she bought a cane, which helps with her symptoms, because it holds her upright.  She uses a walking stick to walk around her property.  Says "every bit of it" (her symptoms) started "after the E. coli."  States that she's been told by her GI doctor, Dr. Loletha Carrow, that she might have lost muscle tone after weight loss subsequent to her E. coli infection in 2018-2019.  To help with this, patient engages in exercises such as lunges.  States she can do what  physical therapy showed her to do, at her home.  She does not engage in exercise aside from lunges.  She was also told that she has a slight / small hiatal hernia.  Denies a significant history of smoking.  Notes she only smoked from about age 39 to 22.  She had a cardiac catheterization in the past.  Notes she couldn't turn her head after the hospitalization from the E. coli in 2018-2019, and "they thought I was having a heart attack."  Due to this, she was admitted for 3-4 days, and told she had "a little bit of plaque in one artery, nothing in the other," and states the doctor said "I don't think you've had a heart attack."  Denies swelling in her feet or legs.  HPI:   Diabetes Mellitus:  Home glucose readings:  She checks once per week, and notes it runs about 125, 129.  It was 96 in the afternoon when she had to have her colonoscopy / endoscopy.    - Patient reports good compliance with therapy plan: medication and/or lifestyle modification  - Her denies acute concerns or problems related to treatment plan  -  She denies new concerns.  Denies polyuria/polydipsia, hypo/ hyperglycemia symptoms.  Denies new onset of: chest pain, exercise intolerance, shortness of breath, dizziness, visual changes, headache, lower extremity swelling or claudication.   Last A1C in the office was:  Lab Results  Component Value Date   HGBA1C 5.9 (A) 02/14/2020   HGBA1C 6.0 (H) 11/07/2019   HGBA1C 5.8 (A) 11/07/2019   Lab Results  Component Value Date   MICROALBUR 10 08/08/2019   LDLCALC 50 11/07/2019   CREATININE 0.80 11/07/2019   BP Readings from Last 3 Encounters:  02/14/20 137/75  01/18/20 (!) 127/41  12/25/19 130/80   Wt Readings from Last 3 Encounters:  02/14/20 179 lb 4.8 oz (81.3 kg)  01/18/20 176 lb (79.8 kg)  12/25/19 176 lb (79.8 kg)   HPI:  Hypertension:  -  Her blood pressure at home "stays pretty steady."  Notes "about like what it is today," and her measurement today is 137/75  in office.  She checks her blood pressure occasionally.  - Patient reports good compliance with medication and/or lifestyle modification  - Her denies acute concerns or problems related to treatment plan  - She denies new onset of: chest pain, exercise intolerance, shortness of breath, dizziness, visual changes, headache, lower extremity swelling or claudication.   Last 3 blood pressure readings in our office are as follows: BP Readings from Last 3 Encounters:  02/14/20 137/75  01/18/20 (!) 127/41  12/25/19 130/80   Filed Weights   02/14/20 1432  Weight: 179 lb 4.8 oz (81.3 kg)    HPI:  Hyperlipidemia:  84 y.o. female here for cholesterol follow-up.   - Patient reports good compliance with treatment plan of:  medication and/ or lifestyle management.    - Patient denies any acute concerns or problems with management plan   - She denies new onset of: myalgias, arthralgias, increased fatigue more than normal, chest pains, exercise intolerance, shortness of breath, dizziness, visual changes, headache, lower extremity swelling or claudication.   Most recent cholesterol panel was:  Lab Results  Component Value Date   CHOL 102 11/07/2019   HDL 28 (L) 11/07/2019   LDLCALC 50 11/07/2019   TRIG 133 11/07/2019   CHOLHDL 3.6 11/07/2019   Hepatic Function Latest Ref Rng & Units 11/07/2019 10/29/2019 09/08/2018  Total Protein 6.0 - 8.5 g/dL 7.1 7.8 8.5(H)  Albumin 3.6 - 4.6 g/dL 4.0 3.8 4.2  AST 0 - 40 IU/L _0 ALT 0 - 32 IU/L _1 Alk Phosphatase 39 - 117 IU/L 88 81 65  Total Bilirubin 0.0 - 1.2 mg/dL 0.4 0.6 1.0        Wt Readings from Last 3 Encounters:  02/14/20 179 lb 4.8 oz (81.3 kg)  01/18/20 176 lb (79.8 kg)  12/25/19 176 lb (79.8 kg)   BP Readings from Last 3 Encounters:  02/14/20 137/75  01/18/20 (!) 127/41  12/25/19 130/80   Pulse Readings from Last 3 Encounters:  02/14/20 63  01/18/20 (!) 59  12/25/19 68   BMI Readings from Last 3 Encounters:    02/14/20 30.78 kg/m  01/18/20 29.29 kg/m  12/25/19 29.29 kg/m     Patient Care Team    Relationship Specialty Notifications Start End  Mina Marble D, NP PCP - General Family Medicine  04/22/17   Pixie Casino, MD PCP - Cardiology Cardiology Admissions 04/21/19   Doran Stabler, MD Consulting Physician Gastroenterology  02/14/20      Patient Active Problem List  Diagnosis Date Noted   Encounter for Medicare annual wellness exam 04/04/2019   Chronic thoracic back pain 01/03/2019   Acute congestive heart failure (HCC)    Non-ST elevation (NSTEMI) myocardial infarction [facility])    Acute diastolic (congestive) heart failure ([place]) 07/02/2018   Elevated troponin 07/02/2018   Diabetes mellitus without complication ([place]) 16/08/9603   Diverticulitis-  mild symptoms 03/30/2018   Irritable bowel syndrome with both constipation and diarrhea 03/30/2018   Healthcare maintenance 03/17/2018   Pain of right heel 07/06/2017   Loose stools 06/24/2017   Right-sided thoracic back pain 06/24/2017   Hyperlipidemia 04/22/2017   GERD (gastroesophageal reflux disease) 04/22/2017   Impaired glucose metabolism 04/22/2017   Hypertension associated with diabetes ([place]) 04/22/2017   Anxiety 04/22/2017    Past Medical history, Surgical history, Family history, Social history, Allergies and Medications have been entered into the medical record, reviewed and changed as needed.    Current Meds  Medication Sig   acetaminophen (TYLENOL) 500 MG tablet Take 1,000 mg by mouth every 6 (six) hours as needed for moderate pain.   aspirin EC 81 MG tablet Take 81 mg by mouth daily.   carvedilol (COREG) 3.125 MG tablet Take 1 tablet (3.125 mg total) by mouth 2 (two) times daily with a meal.   dicyclomine (BENTYL) 10 MG capsule Take 1 capsule (10 mg total) by mouth 3 (three) times daily as needed for spasms (IBS flareup). Future refills need to come from GI   ezetimibe-simvastatin  (VYTORIN) 10-20 MG tablet TAKE 1 TABLET DAILY   LORazepam (ATIVAN) 1 MG tablet 1/2 tablet daily as needed for anxiety   metoCLOPramide (REGLAN) 5 MG tablet Take 1 tablet (5 mg total) by mouth every 8 (eight) hours as needed for nausea.   Multiple Vitamins-Minerals (MULTI FOR HER 50+) TABS Take 1 tablet by mouth daily.   omeprazole (PRILOSEC OTC) 20 MG tablet Take 20 mg by mouth daily.    valsartan (DIOVAN) 160 MG tablet Take one tablet by mouth daily (Patient taking differently: Take 160 mg by mouth daily. )   Wheat Dextrin (BENEFIBER PO) Take by mouth.   [DISCONTINUED] metFORMIN (GLUCOPHAGE) 500 MG tablet 1/2 tablet with breakfast    Allergies:  Allergies  Allergen Reactions   Tape     rash     Review of Systems:  A fourteen system review of systems was performed and found to be positive as per HPI.   Objective:   Blood pressure 137/75, pulse 63, temperature 98 F (36.7 C), temperature source Oral, resp. rate 12, height _0  (1.626 m), weight 179 lb 4.8 oz (81.3 kg), SpO2 99 %. Body mass index is 30.78 kg/m. General:  Well Developed, well nourished, appropriate for stated age.  Neuro:  Alert and oriented,  extra-ocular muscles intact  HEENT:  Normocephalic, atraumatic, neck supple, no carotid bruits appreciated  Skin:  no gross rash, warm, pink. Cardiac:  RRR, S1 S2 Respiratory:  ECTA B/L and A/P, Not using accessory muscles, speaking in full sentences- unlabored. Vascular:  Ext warm, no cyanosis apprec.; cap RF less 2 sec. Psych:  No HI/SI, judgement and insight good, Euthymic mood. Full Affect.

## 2020-02-28 ENCOUNTER — Encounter: Payer: Self-pay | Admitting: Gastroenterology

## 2020-02-28 ENCOUNTER — Ambulatory Visit: Payer: Medicare HMO | Admitting: Gastroenterology

## 2020-02-28 VITALS — BP 160/64 | HR 76 | Temp 98.3°F | Ht 64.0 in | Wt 175.0 lb

## 2020-02-28 DIAGNOSIS — R140 Abdominal distension (gaseous): Secondary | ICD-10-CM

## 2020-02-28 DIAGNOSIS — R194 Change in bowel habit: Secondary | ICD-10-CM

## 2020-02-28 NOTE — Progress Notes (Signed)
Andrea Brooks GI Progress Note  Chief Complaint: Chronic diarrhea  Subjective  History: Seen in office early February for episodic diarrhea described in that note.  She often had constipation as well, but these acute episodes of diarrhea were the most bothersome thing that brought her to see me. EGD and colonoscopy late February, normal duodenal biopsies.  Colon biopsies negative for microscopic colitis.  Adenomatous polyp found, no recall needed at her age.  My colonoscopy report had my impression that this seemed most likely primary problem of chronic constipation with diverticulosis and perhaps pelvic floor dysfunction (based on the patient's report of symptoms), which may then escalate to the point of catharsis with loose stool and urgency.  Of note, the patient reported that her bowel preparation for the colonoscopy did not work until the following morning.  She had severe diverticulosis with associated haustral thickening and tortuosity, making for challenging scope passage. Also, despite her perceived weight loss of 60 pounds in the last couple of years from the symptoms, I was able to discover only a documented 7 pound weight loss since August 2019. _____________________________ Andrea Brooks is happy to report that she is feeling better overall.  A tablespoon a day of Benefiber was "too much" and caused bloating gas and loose stool.  She then went to two fiber Gummies a day and things are better.  Her bowel habits are more regular.  She is still bloated and gassy frequently Andrea Brooks is intermittent regurgitation.  She wanted to ask about a natural remedy she saw in her recent newspaper article, and wanted to know more about her hiatal hernia and colon polyp. I also wonder about possible food allergies, lactose intolerance or gluten intolerance. All procedure and biopsy results reviewed.  Biopsies negative for celiac sprue.  She is able to consume milk and cheese without abdominal bloating gas  or diarrhea, indicating no lactose intolerance. Still tends toward constipation with stool hard and the need to strain.  She tried stool softeners without improvement.  ROS: Cardiovascular:  no chest pain Respiratory: no dyspnea  The patient's Past Medical, Family and Social History were reviewed and are on file in the EMR.  Objective:  Med list reviewed  Current Outpatient Medications:  .  acetaminophen (TYLENOL) 500 MG tablet, Take 1,000 mg by mouth every 6 (six) hours as needed for moderate pain., Disp: , Rfl:  .  aspirin EC 81 MG tablet, Take 81 mg by mouth daily., Disp: , Rfl:  .  carvedilol (COREG) 3.125 MG tablet, Take 1 tablet (3.125 mg total) by mouth 2 (two) times daily with a meal., Disp: 180 tablet, Rfl: 3 .  dicyclomine (BENTYL) 10 MG capsule, Take 1 capsule (10 mg total) by mouth 3 (three) times daily as needed for spasms (IBS flareup). Future refills need to come from GI, Disp: 30 capsule, Rfl: 0 .  ezetimibe-simvastatin (VYTORIN) 10-20 MG tablet, TAKE 1 TABLET DAILY, Disp: 90 tablet, Rfl: 0 .  LORazepam (ATIVAN) 1 MG tablet, 1/2 tablet daily as needed for anxiety, Disp: 30 tablet, Rfl: 0 .  metoCLOPramide (REGLAN) 5 MG tablet, Take 1 tablet (5 mg total) by mouth every 8 (eight) hours as needed for nausea., Disp: 12 tablet, Rfl: 0 .  Multiple Vitamins-Minerals (MULTI FOR HER 50+) TABS, Take 1 tablet by mouth daily., Disp: , Rfl:  .  omeprazole (PRILOSEC OTC) 20 MG tablet, Take 20 mg by mouth daily. , Disp: , Rfl:  .  valsartan (DIOVAN) 160 MG tablet, Take one tablet  by mouth daily (Patient taking differently: Take 160 mg by mouth daily. ), Disp: 90 tablet, Rfl: 1 .  Wheat Dextrin (BENEFIBER PO), Take by mouth., Disp: , Rfl:    Vital signs in last 24 hrs: Vitals:   02/28/20 1334  BP: (!) 160/64  Pulse: 76  Temp: 98.3 F (36.8 C)    Physical Exam   HEENT: sclera anicteric, oral mucosa moist without lesions  Neck: supple, no thyromegaly, JVD or  lymphadenopathy  Cardiac: RRR without murmurs, S1S2 heard, no peripheral edema  Pulm: clear to auscultation bilaterally, normal RR and effort noted  Abdomen: soft, no tenderness, with active bowel sounds. No guarding or palpable hepatosplenomegaly.  Skin; warm and dry, no jaundice or rash Antalgic gait, uses cane, gets on exam table without assistance Labs:   ___________________________________________ Radiologic studies:   ____________________________________________ Other:  Biopsy results as noted above _____________________________________________ Assessment & Plan  Assessment: Encounter Diagnoses  Name Primary?  Marland Kitchen Altered bowel habits Yes  . Abdominal bloating    This is primarily constipation, with some episodic diarrhea.  I still think she has an element of IBS, possibly some maldigestion/dietary intolerance accounting for bloating and gas.  Some written dietary advice was given.  Reviewed all results, reassured her.  Fiber Gummies have very little fiber in them, so I recommended she consider a smaller dose of the Benefiber along with a quarter to half capful a day of MiraLAX.  No need for surveillance colonoscopy due to history of polyp at her age  Plan: See me as needed   20 minutes were spent on this encounter (including chart review, history/exam, counseling/coordination of care, and documentation)  Andrea Brooks

## 2020-02-28 NOTE — Patient Instructions (Addendum)
If you are age 82 or older, your body mass index should be between 23-30. Your Body mass index is 30.04 kg/m. If this is out of the aforementioned range listed, please consider follow up with your Primary Care Provider.  If you are age 75 or younger, your body mass index should be between 19-25. Your Body mass index is 30.04 kg/m. If this is out of the aformentioned range listed, please consider follow up with your Primary Care Provider.   Follow up as needed.   It was a pleasure to see you today!  Dr. Loletha Carrow   ___________________________________________________________________  Food Guidelines for those with chronic digestive trouble:  Many people have difficulty digesting certain foods, causing a variety of distressing and embarrassing symptoms such as abdominal pain, bloating and gas.  These foods may need to be avoided or consumed in small amounts.  Here are some tips that might be helpful for you.  1.   Lactose intolerance is the difficulty or complete inability to digest lactose, the natural sugar in milk and anything made from milk.  This condition is harmless, common, and can begin any time during life.  Some people can digest a modest amount of lactose while others cannot tolerate any.  Also, not all dairy products contain equal amounts of lactose.  For example, hard cheeses such as parmesan have less lactose than soft cheeses such as cheddar.  Yogurt has less lactose than milk or cheese.  Many packaged foods (even many brands of bread) have milk, so read ingredient lists carefully.  It is difficult to test for lactose intolerance, so just try avoiding lactose as much as possible for a week and see what happens with your symptoms.  If you seem to be lactose intolerant, the best plan is to avoid it (but make sure you get calcium from another source).  The next best thing is to use lactase enzyme supplements, available over the counter everywhere.  Just know that many lactose intolerant  people need to take several tablets with each serving of dairy to avoid symptoms.  Lastly, a lot of restaurant food is made with milk or butter.  Many are things you might not suspect, such as mashed potatoes, rice and pasta (cooked with butter) and "grilled" items.  If you are lactose intolerant, it never hurts to ask your server what has milk or butter.  2.   Fiber is an important part of your diet, but not all fiber is well-tolerated.  Insoluble fiber such as bran is often consumed by normal gut bacteria and converted into gas.  Soluble fiber such as oats, squash, carrots and green beans are typically tolerated better.  3.   Some types of carbohydrates can be poorly digested.  Examples include: fructose (apples, cherries, pears, raisins and other dried fruits), fructans (onions, zucchini, large amounts of wheat), sorbitol/mannitol/xylitol and sucralose/Splenda (common artificial sweeteners), and raffinose (lentils, broccoli, cabbage, asparagus, brussel sprouts, many types of beans).  Do a Development worker, community for [employer] and you will find helpful information. Beano, a dietary supplement, will often help with raffinose-containing foods.  As with lactase tablets, you may need several per serving.  4.   Whenever possible, avoid processed food&meats and chemical additives.  High fructose corn syrup, a common sweetener, may be difficult to digest.  Eggs and soy (comes from the soybean, and added to many foods now) are other common bloating/gassy foods.  5.  Regarding gluten:  gluten is a protein mainly found in wheat, but  also rye and barley.  There is a condition called celiac sprue, which is an inflammatory reaction in the small intestine causing a variety of digestive symptoms.  Blood testing is highly reliable to look for this condition, and sometimes upper endoscopy with small bowel biopsies may be necessary to make the diagnosis.  Many patients who test negative for celiac sprue report improvement in their  digestive symptoms when they switch to a gluten-free diet.  However, in these "non-celiac gluten sensitive" patients, the true role of gluten in their symptoms is unclear.  Reducing carbohydrates in general may decrease the gas and bloating caused when gut bacteria consume carbs. Also, some of these patients may actually be intolerant of the baker's yeast in bread products rather than the gluten.  Flatbread and other reduced yeast breads might therefore be tolerated.  There is no specific testing available for most food intolerances, which are discovered mainly by dietary elimination.  Please do not embark on a gluten free diet unless directed by your doctor, as it is highly restrictive, and may lead to nutritional deficiencies if not carefully monitored.  Lastly, beware of internet claims offering "personalized" tests for food intolerances.  Such testing has no reliable scientific evidence to support its reliability and correlation to symptoms.    6.  The best advice is old advice, especially for those with chronic digestive trouble - try to eat "clean".  Balanced diet, avoid processed food, plenty of fruits and vegetables, cut down the sugar, minimal alcohol, avoid tobacco. Make time to care for yourself, get enough sleep, exercise when you can, reduce stress.  Your guts will thank you for it.   - Dr. Herma [facility]

## 2020-03-08 ENCOUNTER — Telehealth: Payer: Self-pay | Admitting: Adult Health

## 2020-03-09 ENCOUNTER — Other Ambulatory Visit: Payer: Self-pay | Admitting: Adult Health

## 2020-04-12 ENCOUNTER — Encounter: Payer: Self-pay | Admitting: Internal Medicine

## 2020-04-12 ENCOUNTER — Other Ambulatory Visit: Payer: Self-pay

## 2020-04-12 ENCOUNTER — Ambulatory Visit: Payer: Medicare HMO | Admitting: Internal Medicine

## 2020-04-12 VITALS — BP 142/78 | HR 61 | Ht 64.0 in | Wt 172.8 lb

## 2020-04-12 DIAGNOSIS — I2510 Atherosclerotic heart disease of native coronary artery without angina pectoris: Secondary | ICD-10-CM

## 2020-04-12 DIAGNOSIS — E782 Mixed hyperlipidemia: Secondary | ICD-10-CM

## 2020-04-12 DIAGNOSIS — I10 Essential (primary) hypertension: Secondary | ICD-10-CM | POA: Diagnosis not present

## 2020-04-12 NOTE — Patient Instructions (Signed)
Medication Instructions:  Your physician recommends that you continue on your current medications as directed. Please refer to the Current Medication list given to you today.  *If you need a refill on your cardiac medications before your next appointment, please call your pharmacy*   Follow-Up: At [facility], you and your health needs are our priority.  As part of our continuing mission to provide you with exceptional heart care, we have created designated Provider Care Teams.  These Care Teams include your primary Cardiologist (physician) and Advanced Practice Providers (APPs -  Physician Assistants and Nurse Practitioners) who all work together to provide you with the care you need, when you need it.  We recommend signing up for the patient portal called "MyChart".  Sign up information is provided on this After Visit Summary.  MyChart is used to connect with patients for Virtual Visits (Telemedicine).  Patients are able to view lab/test results, encounter notes, upcoming appointments, etc.  Non-urgent messages can be sent to your provider as well.   To learn more about what you can do with MyChart, go to [email or web address].    Your next appointment:   12 month(s)  The format for your next appointment:   In Person or Virtual  Provider:   You may see Pixie Casino, MD or one of the following Advanced Practice Providers on your designated Care Team:    Almyra Deforest, PA-C  Fabian Sharp, PA-C or   Roby Lofts, [place]    Other Instructions

## 2020-04-12 NOTE — Progress Notes (Signed)
OFFICE NOTE  Chief Complaint:  Routine follow-up  Primary Care Physician: Patient, No Pcp Per  HPI:  Andrea Brooks is a 84 y.o. female with a past medial history significant for anxiety, type 2 diabetes, GERD, hypertension and dyslipidemia, who recently was admitted for persistent diarrhea, found to have an E. coli infection.  Subsequently after significant hydration she developed what was suspected to be acute diastolic congestive heart failure.  This required diuresis and she was seen by Dr. Radford Pax and Dr. Oval Linsey.  She was noted to have elevated troponin was referred for heart catheterization which showed mild nonobstructive coronary disease.  As her husband is a patient of mine, she requested to follow-up with me.  She reports her diarrhea has improved significantly.  She saw Dr. Loletha Carrow, who is been working with her to treat that.  While hospitalized they recommended starting appropriate heart failure treatment.  An echo was performed which showed normal systolic function and mild diastolic dysfunction.  It was recommended she go on to low-dose metoprolol and that her Vytorin's be switched over to atorvastatin for better LDL reduction.  She was leery of making those changes and wanted to discuss it with me further.  She did see her primary care provider in follow-up in a repeat lipid profile was performed.  This was 2 weeks ago and demonstrated total cholesterol of 85, triglycerides 128, HDL 24 and LDL 35.  At this point, it is not likely that she needs to switch cholesterol medications, and the low numbers are most likely related to her diarrhea over the past several weeks.  I suspect they are in fact falsely lowered because of this.  Overall though she feels better, denying any worsening shortness of breath or chest pain.  Pressure is mildly elevated 148/82 today.  She says is been running in the 140s at home.  10/18/2018  Andrea Brooks is seen today in routine follow-up.  Overall she is doing  well.  She is down about 6 pounds since I last saw her.  Some of that was fluid related to her diastolic heart failure also after her significant E. coli infection she is not had much of an appetite and continues to have IBS-D symptoms.  Overall she denies any chest pain or worsening shortness of breath.  We did repeat some labs indicated a small increase in her lipid profile.  Total cholesterol now 120, triglycerides 162, HDL 31 and LDL 57.  She says she did have a decrease in metformin due to improving hemoglobin A1c is 5.6.  EKG today shows sinus rhythm with left anterior fascicular block at 78.  04/14/2020  Andrea Brooks returns for follow-up.  She has no specific complaints today.  Dr. Loletha Carrow for her IBS-D symptoms.  The profile was in December 2020 which showed total cholesterol 102, triglycerides 133, HDL 28 and LDL of 50.  EKG shows sinus rhythm with first-degree AV block, left anterior fascicular block and voltage criteria for LVH at 24.  PMHx:  Past Medical History:  Diagnosis Date  . Anxiety   . Colon polyps   . Diabetes mellitus without complication ([place])   . Diverticulosis   . Food poisoning   . Gallstones   . GERD (gastroesophageal reflux disease)   . Hyperlipidemia   . Hypertension   . Obesity   . 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  ABDOMINAL HYSTERECTOMY     total  . BREAST BIOPSY Left    neg  . CHOLECYSTECTOMY    . JOINT REPLACEMENT Bilateral    knee  . LEFT HEART CATH AND CORONARY ANGIOGRAPHY N/A 07/04/2018   Procedure: LEFT HEART CATH AND CORONARY ANGIOGRAPHY;  Surgeon: Jettie Booze, MD;  Location: [facility];  Service: Cardiovascular;  Laterality: N/A;  . REPLACEMENT TOTAL KNEE BILATERAL      FAMHx:  Family History  Problem Relation Age of Onset  . Hypertension Mother   . Colon cancer Mother   . Stroke Father   . Heart failure  Father   . Stroke Sister   . Stroke Brother   . Graves' disease Daughter   . Breast cancer Paternal Aunt   . Graves' disease Sister   . Bone cancer Sister   . Breast cancer Maternal Aunt   . Esophageal cancer Neg Hx   . Rectal cancer Neg Hx   . Liver cancer Neg Hx     SOCHx:   reports that she quit smoking about 60 years ago. She has a 2.00 pack-year smoking history. She has never used smokeless tobacco. She reports that she does not drink alcohol or use drugs.  ALLERGIES:  Allergies  Allergen Reactions  . Tape     rash    ROS: Pertinent items noted in HPI and remainder of comprehensive ROS otherwise negative.  HOME MEDS: Current Outpatient Medications on File Prior to Visit  Medication Sig Dispense Refill  . acetaminophen (TYLENOL) 500 MG tablet Take 1,000 mg by mouth every 6 (six) hours as needed for moderate pain.    Marland Kitchen aspirin EC 81 MG tablet Take 81 mg by mouth daily.    . carvedilol (COREG) 3.125 MG tablet Take 1 tablet (3.125 mg total) by mouth 2 (two) times daily with a meal. 180 tablet 3  . dicyclomine (BENTYL) 10 MG capsule Take 1 capsule (10 mg total) by mouth 3 (three) times daily as needed for spasms (IBS flareup). Future refills need to come from GI 30 capsule 0  . ezetimibe-simvastatin (VYTORIN) 10-20 MG tablet TAKE 1 TABLET DAILY 90 tablet 0  . LORazepam (ATIVAN) 1 MG tablet 1/2 tablet daily as needed for anxiety 30 tablet 0  . metoCLOPramide (REGLAN) 5 MG tablet Take 1 tablet (5 mg total) by mouth every 8 (eight) hours as needed for nausea. 12 tablet 0  . Multiple Vitamins-Minerals (MULTI FOR HER 50+) TABS Take 1 tablet by mouth daily.    Marland Kitchen omeprazole (PRILOSEC OTC) 20 MG tablet Take 20 mg by mouth daily.     . valsartan (DIOVAN) 160 MG tablet TAKE 1 TABLET DAILY 90 tablet 0  . Wheat Dextrin (BENEFIBER PO) Take by mouth.     No current facility-administered medications on file prior to visit.    LABS/IMAGING: No results found for this or any previous visit  (from the past 48 hour(s)). No results found.  LIPID PANEL:    Component Value Date/Time   CHOL 102 11/07/2019 1043   TRIG 133 11/07/2019 1043   HDL 28 (L) 11/07/2019 1043   CHOLHDL 3.6 11/07/2019 1043   CHOLHDL 3.5 07/04/2018 0526   VLDL 26 07/04/2018 0526   LDLCALC 50 11/07/2019 1043     WEIGHTS: Wt Readings from Last 3 Encounters:  04/12/20 172 lb 12.8 oz (78.4 kg)  02/28/20 175 lb (79.4 kg)  02/14/20 179 lb 4.8 oz (81.3 kg)    VITALS: BP (!) 142/78  Pulse 61   Ht 5\' 4"  (1.626 m)   Wt 172 lb 12.8 oz (78.4 kg)   SpO2 100%   BMI 29.66 kg/m   EXAM: General appearance: alert and no distress Neck: no carotid bruit, no JVD and thyroid not enlarged, symmetric, no tenderness/mass/nodules Lungs: clear to auscultation bilaterally Heart: regular rate and rhythm, S1, S2 normal, no murmur, click, rub or gallop Abdomen: soft, non-tender; bowel sounds normal; no masses,  no organomegaly Extremities: extremities normal, atraumatic, no cyanosis or edema Pulses: 2+ and symmetric Skin: Skin color, texture, turgor normal. No rashes or lesions Neurologic: Grossly normal Psych: Pleasant  EKG: Sinus rhythm first-degree AV block, left anterior fascicular block, moderate voltage criteria for LVH at 61-personally reviewed  ASSESSMENT: 1. History of acute diastolic congestive heart failure (EF 60 to 65%, 06/2018) 2. Dyslipidemia 3. Hypertension 4. Mild nonobstructive coronary disease (cath 06/2018)  PLAN: 1.   Andrea Brooks had no recent heart failure symptoms.  Her lipids are well controlled.  Blood pressure is at goal.  She had no significant coronary disease by cath in 2019.  We will continue to follow her clinically and adjust medications as necessary.  Follow-up with me annually or sooner as necessary.  Pixie Casino, MD, [facility], [place] Director of the [facility] Lipid [facility]  Cardiovascular Risk Reduction Clinic Diplomate of the American  Board of Clinical Lipidology Attending Cardiologist  Direct Dial: [phone]  Fax: [phone]  Website:  [email or web address] 04/12/2020, 2:05 PM

## 2020-04-14 ENCOUNTER — Encounter: Payer: Self-pay | Admitting: Internal Medicine

## 2020-04-18 ENCOUNTER — Ambulatory Visit: Payer: Medicare HMO | Admitting: Physician Assistant

## 2020-05-15 ENCOUNTER — Other Ambulatory Visit: Payer: Medicare HMO

## 2020-05-15 ENCOUNTER — Ambulatory Visit: Payer: Medicare HMO | Admitting: Physician Assistant

## 2020-05-15 ENCOUNTER — Other Ambulatory Visit: Payer: Self-pay

## 2020-05-15 ENCOUNTER — Encounter: Payer: Self-pay | Admitting: Physician Assistant

## 2020-05-15 VITALS — BP 143/78 | HR 59 | Temp 98.1°F | Ht 63.0 in | Wt 175.1 lb

## 2020-05-15 DIAGNOSIS — E1159 Type 2 diabetes mellitus with other circulatory complications: Secondary | ICD-10-CM | POA: Diagnosis not present

## 2020-05-15 DIAGNOSIS — K219 Gastro-esophageal reflux disease without esophagitis: Secondary | ICD-10-CM

## 2020-05-15 DIAGNOSIS — E119 Type 2 diabetes mellitus without complications: Secondary | ICD-10-CM

## 2020-05-15 DIAGNOSIS — E785 Hyperlipidemia, unspecified: Secondary | ICD-10-CM | POA: Diagnosis not present

## 2020-05-15 DIAGNOSIS — I10 Essential (primary) hypertension: Secondary | ICD-10-CM

## 2020-05-15 DIAGNOSIS — I152 Hypertension secondary to endocrine disorders: Secondary | ICD-10-CM

## 2020-05-15 MED ORDER — PANTOPRAZOLE SODIUM 20 MG PO TBEC: 20.0000 mg | DELAYED_RELEASE_TABLET | Freq: Every day | ORAL | 1 refills | Status: DC

## 2020-05-15 NOTE — Assessment & Plan Note (Signed)
-   A1c today is 6.1, stable -Continue ambulatory glucose monitoring and notify clinic if FBS consistently <80 or >160. - Encourage to continue to follow low glucose/carbohydrate diet to keep diabetes under control with diet. - Will continue to monitor.

## 2020-05-15 NOTE — Progress Notes (Signed)
Established Patient Office Visit  Subjective:  Patient ID: Andrea Brooks, female    DOB: Jul 29, 1936  Age: 84 y.o. MRN: [id number]  CC:  Chief Complaint  Patient presents with  . Diabetes  . Hypertension  . Hyperlipidemia    HPI Andrea Brooks presents for follow-up on diabetes mellitus, hypertension, and hyperlipidemia.  Diabetes: Pt denies increased urination or thirst. No hypoglycemic events. Checking glucose at home. FBS range 105-120s.  HTN: Pt denies chest pain, palpitations, dizziness or lower extremity swelling. Taking medication as directed without side effects. Pt has recently bought an upper arm BP cuff and will start checking BP at home. Pt states she has been on the same medication dose for a long time and doesn't want to change medication dose yet, she wants to start checking it at home with her new arm BP cuff. Reports poor hydration.   HLD: Followed by cardiology. Pt taking medication as directed without issues. Denies side effects including myalgias and RUQ pain.   GERD: States she has tried OTC Pepcid x about 2 weeks but isn't very effective at controlling her heartburn. She has taken omeprazole and nexium in the past which were more helpful but had to change due to insurance purposes.   Past Medical History:  Diagnosis Date  . Anxiety   . Colon polyps   . Diabetes mellitus without complication ([place])   . Diverticulosis   . Food poisoning   . Gallstones   . GERD (gastroesophageal reflux disease)   . Hyperlipidemia   . Hypertension   . Obesity   . 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 ABDOMINAL HYSTERECTOMY     total  . BREAST BIOPSY Left    neg  . CHOLECYSTECTOMY    . JOINT REPLACEMENT Bilateral    knee  . LEFT HEART CATH AND CORONARY ANGIOGRAPHY N/A 07/04/2018   Procedure: LEFT HEART CATH AND CORONARY ANGIOGRAPHY;  Surgeon: Jettie Booze, MD;  Location: [facility];  Service: Cardiovascular;  Laterality: N/A;  . REPLACEMENT TOTAL KNEE BILATERAL      Family History  Problem Relation Age of Onset  . Hypertension Mother   . Colon cancer Mother   . Stroke Father   . Heart failure Father   . Stroke Sister   . Stroke Brother   . Graves' disease Daughter   . Breast cancer Paternal Aunt   . Graves' disease Sister   . Bone cancer Sister   . Breast cancer Maternal Aunt   . Esophageal cancer Neg Hx   . Rectal cancer Neg Hx   . Liver cancer Neg Hx     Social History   Socioeconomic History  . Marital status: Married    Spouse name: Not on file  . Number of children: 1  . Years of education: Not on file  . Highest education level: Not on file  Occupational History  . Occupation: retired  Tobacco Use  . Smoking status: Former Smoker    Packs/day: 1.00    Years: 2.00    Pack years: 2.00    Quit date: 11/24/1959    Years since quitting: 60.5  . Smokeless tobacco: Never Used  Vaping Use  . Vaping Use: Never used  Substance and Sexual Activity  . Alcohol use: No  . Drug use: No  . Sexual activity: Not Currently  Other Topics  Concern  . Not on file  Social History Narrative  . Not on file   Social Determinants of Health   Financial Resource Strain:   . Difficulty of Paying Living Expenses:   Food Insecurity:   . Worried About Charity fundraiser in the Last Year:   . Arboriculturist in the Last Year:   Transportation Needs:   . Film/video editor (Medical):   Marland Kitchen Lack of Transportation (Non-Medical):   Physical Activity:   . Days of Exercise per Week:   . Minutes of Exercise per Session:   Stress:   . Feeling of Stress :   Social Connections:   . Frequency of Communication with Friends and Family:   . Frequency of Social Gatherings with Friends and Family:   . Attends Religious Services:   . Active Member of Clubs or Organizations:   . Attends Archivist Meetings:   Marland Kitchen  Marital Status:   Intimate Partner Violence:   . Fear of Current or Ex-Partner:   . Emotionally Abused:   Marland Kitchen Physically Abused:   . Sexually Abused:     Outpatient Medications Prior to Visit  Medication Sig Dispense Refill  . acetaminophen (TYLENOL) 500 MG tablet Take 1,000 mg by mouth every 6 (six) hours as needed for moderate pain.    Marland Kitchen aspirin EC 81 MG tablet Take 81 mg by mouth daily.    . carvedilol (COREG) 3.125 MG tablet Take 1 tablet (3.125 mg total) by mouth 2 (two) times daily with a meal. 180 tablet 3  . dicyclomine (BENTYL) 10 MG capsule Take 1 capsule (10 mg total) by mouth 3 (three) times daily as needed for spasms (IBS flareup). Future refills need to come from GI 30 capsule 0  . ezetimibe-simvastatin (VYTORIN) 10-20 MG tablet TAKE 1 TABLET DAILY 90 tablet 0  . LORazepam (ATIVAN) 1 MG tablet 1/2 tablet daily as needed for anxiety 30 tablet 0  . metoCLOPramide (REGLAN) 5 MG tablet Take 1 tablet (5 mg total) by mouth every 8 (eight) hours as needed for nausea. 12 tablet 0  . Multiple Vitamins-Minerals (MULTI FOR HER 50+) TABS Take 1 tablet by mouth daily.    . Probiotic Product (PROBIOTIC DAILY PO) Take by mouth.    . valsartan (DIOVAN) 160 MG tablet TAKE 1 TABLET DAILY 90 tablet 0  . omeprazole (PRILOSEC OTC) 20 MG tablet Take 20 mg by mouth daily.  (Patient not taking: Reported on 05/15/2020)    . Wheat Dextrin (BENEFIBER PO) Take by mouth. (Patient not taking: Reported on 05/15/2020)     No facility-administered medications prior to visit.    Allergies  Allergen Reactions  . Tape     rash    ROS Review of Systems  A fourteen system review of systems was performed and found to be positive as per HPI.   Objective:    Physical Exam General: Well nourished, in no apparent distress. Eyes: PERRLA, EOMs, conjunctiva clr Resp: Respiratory effort- normal, ECTA B/L w/o wheezing Cardio: RRR w/o MRGs. Abdomen: no gross distention. Lymphatics:  less 2 sec cap RF. No edema  present. M-sk: Full ROM, 5/5 strength, normal gait.  Skin: Warm, dry  Neuro: Alert, Oriented, no focal deficits Psych: Normal affect, Insight and Judgment appropriate.   BP (!) 143/78   Pulse (!) 59   Temp 98.1 F (36.7 C) (Oral)   Ht 5\' 3"  (1.6 m)   Wt 175 lb 1.6 oz (79.4 kg)   SpO2  97%   BMI 31.02 kg/m  Wt Readings from Last 3 Encounters:  05/15/20 175 lb 1.6 oz (79.4 kg)  04/12/20 172 lb 12.8 oz (78.4 kg)  02/28/20 175 lb (79.4 kg)     Health Maintenance Due  Topic Date Due  . PNA vac Low Risk Adult (2 of 2 - PPSV23) 10/05/2018    There are no preventive care reminders to display for this patient.  Lab Results  Component Value Date   TSH 1.620 11/07/2019   Lab Results  Component Value Date   WBC 6.9 11/07/2019   HGB 13.1 11/07/2019   HCT 38.8 11/07/2019   MCV 89 11/07/2019   PLT 191 11/07/2019   Lab Results  Component Value Date   NA 138 11/07/2019   K 4.6 11/07/2019   CO2 24 11/07/2019   GLUCOSE 120 (H) 11/07/2019   BUN 11 11/07/2019   CREATININE 0.80 11/07/2019   BILITOT 0.4 11/07/2019   ALKPHOS 88 11/07/2019   AST 22 11/07/2019   ALT 13 11/07/2019   PROT 7.1 11/07/2019   ALBUMIN 4.0 11/07/2019   CALCIUM 9.4 11/07/2019   ANIONGAP 9 10/29/2019   GFR 72.50 07/13/2017   Lab Results  Component Value Date   CHOL 102 11/07/2019   Lab Results  Component Value Date   HDL 28 (L) 11/07/2019   Lab Results  Component Value Date   LDLCALC 50 11/07/2019   Lab Results  Component Value Date   TRIG 133 11/07/2019   Lab Results  Component Value Date   CHOLHDL 3.6 11/07/2019   Lab Results  Component Value Date   HGBA1C 5.9 (A) 02/14/2020      Assessment & Plan:   Problem List Items Addressed This Visit      Cardiovascular and Mediastinum   Hypertension associated with diabetes ([place])    - BP today is 162/83 HR 59, repeat BP 143/78- improved but still above goal. - Continue Valsartan and Carvedilol.  - Discussed with patient to start  ambulatory BP and pulse monitoring and provided instructions. If BP consistently > 140/90, notify the office and will make medication adjustments (increase Valsartan to 320 mg). Pt verbalized understanding. - Follow DASH diet and increase fluid intake (water). - Encourage to stay as active as possible.         Digestive   GERD (gastroesophageal reflux disease)    - Discontinued Pepcid and will start pantoprazole 20 mg once daily. - Encourage to reduce provocative foods.      Relevant Medications   Probiotic Product (PROBIOTIC DAILY PO)   pantoprazole (PROTONIX) 20 MG tablet     Endocrine   Diabetes mellitus without complication (HCC) - Primary (Chronic)    - A1c today is 6.1, stable -Continue ambulatory glucose monitoring and notify clinic if FBS consistently <80 or >160. - Encourage to continue to follow low glucose/carbohydrate diet to keep diabetes under control with diet. - Will continue to monitor.        Relevant Orders   POCT glycosylated hemoglobin (Hb A1C)     Other   Hyperlipidemia    - Last lipid panel wnl's. - Continue Vytorin 10-20 mg. - Follow heart healthy diet and stay as active as possible.           Meds ordered this encounter  Medications  . pantoprazole (PROTONIX) 20 MG tablet    Sig: Take 1 tablet (20 mg total) by mouth daily.    Dispense:  90 tablet    Refill:  1    Order Specific Question:   Supervising Provider    Answer:   Beatrice Lecher D [[id number]]    Follow-up: Return in about 3 months (around 08/15/2020) for DM, HTN, HLD, GERD.    Lorrene Reid, PA-C

## 2020-05-15 NOTE — Assessment & Plan Note (Signed)
-   BP today is 162/83 HR 59, repeat BP 143/78- improved but still above goal. - Continue Valsartan and Carvedilol.  - Discussed with patient to start ambulatory BP and pulse monitoring and provided instructions. If BP consistently > 140/90, notify the office and will make medication adjustments (increase Valsartan to 320 mg). Pt verbalized understanding. - Follow DASH diet and increase fluid intake (water). - Encourage to stay as active as possible.

## 2020-05-15 NOTE — Assessment & Plan Note (Addendum)
-   Discontinued Pepcid and will start pantoprazole 20 mg once daily. - Encourage to reduce provocative foods.

## 2020-05-15 NOTE — Patient Instructions (Signed)
Check BP and HR for two weeks: - Check BP in the morning and in the afternoon/evening x 7 days - Check BP in the morning x 7 days - If BP consistently >140/90, notify the office and will make adjustments to BP medication. - Stay well hydrated, at least 64 fl oz - Watch sodium intake   DASH Eating Plan DASH stands for "Dietary Approaches to Stop Hypertension." The DASH eating plan is a healthy eating plan that has been shown to reduce high blood pressure (hypertension). It may also reduce your risk for type 2 diabetes, heart disease, and stroke. The DASH eating plan may also help with weight loss. What are tips for following this plan?  General guidelines  Avoid eating more than 2,300 mg (milligrams) of salt (sodium) a day. If you have hypertension, you may need to reduce your sodium intake to 1,500 mg a day.  Limit alcohol intake to no more than 1 drink a day for nonpregnant women and 2 drinks a day for men. One drink equals 12 oz of beer, 5 oz of wine, or 1 oz of hard liquor.  Work with your health care provider to maintain a healthy body weight or to lose weight. Ask what an ideal weight is for you.  Get at least 30 minutes of exercise that causes your heart to beat faster (aerobic exercise) most days of the week. Activities may include walking, swimming, or biking.  Work with your health care provider or diet and nutrition specialist (dietitian) to adjust your eating plan to your individual calorie needs. Reading food labels   Check food labels for the amount of sodium per serving. Choose foods with less than 5 percent of the Daily Value of sodium. Generally, foods with less than 300 mg of sodium per serving fit into this eating plan.  To find whole grains, look for the word "whole" as the first word in the ingredient list. Shopping  Buy products labeled as "low-sodium" or "no salt added."  Buy fresh foods. Avoid canned foods and premade or frozen meals. Cooking  Avoid adding  salt when cooking. Use salt-free seasonings or herbs instead of table salt or sea salt. Check with your health care provider or pharmacist before using salt substitutes.  Do not fry foods. Cook foods using healthy methods such as baking, boiling, grilling, and broiling instead.  Cook with heart-healthy oils, such as olive, canola, soybean, or sunflower oil. Meal planning  Eat a balanced diet that includes: ? 5 or more servings of fruits and vegetables each day. At each meal, try to fill half of your plate with fruits and vegetables. ? Up to 6-8 servings of whole grains each day. ? Less than 6 oz of lean meat, poultry, or fish each day. A 3-oz serving of meat is about the same size as a deck of cards. One egg equals 1 oz. ? 2 servings of low-fat dairy each day. ? A serving of nuts, seeds, or beans 5 times each week. ? Heart-healthy fats. Healthy fats called Omega-3 fatty acids are found in foods such as flaxseeds and coldwater fish, like sardines, salmon, and mackerel.  Limit how much you eat of the following: ? Canned or prepackaged foods. ? Food that is high in trans fat, such as fried foods. ? Food that is high in saturated fat, such as fatty meat. ? Sweets, desserts, sugary drinks, and other foods with added sugar. ? Full-fat dairy products.  Do not salt foods before eating.  Try  to eat at least 2 vegetarian meals each week.  Eat more home-cooked food and less restaurant, buffet, and fast food.  When eating at a restaurant, ask that your food be prepared with less salt or no salt, if possible. What foods are recommended? The items listed may not be a complete list. Talk with your dietitian about what dietary choices are best for you. Grains Whole-grain or whole-wheat bread. Whole-grain or whole-wheat pasta. Brown rice. [place]. Bulgur. Whole-grain and low-sodium cereals. Pita bread. Low-fat, low-sodium crackers. Whole-wheat flour tortillas. Vegetables Fresh or frozen  vegetables (raw, steamed, roasted, or grilled). Low-sodium or reduced-sodium tomato and vegetable juice. Low-sodium or reduced-sodium tomato sauce and tomato paste. Low-sodium or reduced-sodium canned vegetables. Fruits All fresh, dried, or frozen fruit. Canned fruit in natural juice (without added sugar). Meat and other protein foods Skinless chicken or [place]. Ground chicken or [place]. Pork with fat trimmed off. Fish and seafood. Egg whites. Dried beans, peas, or lentils. Unsalted nuts, nut butters, and seeds. Unsalted canned beans. Lean cuts of beef with fat trimmed off. Low-sodium, lean deli meat. Dairy Low-fat (1%) or fat-free (skim) milk. Fat-free, low-fat, or reduced-fat cheeses. Nonfat, low-sodium ricotta or cottage cheese. Low-fat or nonfat yogurt. Low-fat, low-sodium cheese. Fats and oils Soft margarine without trans fats. Vegetable oil. Low-fat, reduced-fat, or light mayonnaise and salad dressings (reduced-sodium). Canola, safflower, olive, soybean, and sunflower oils. Avocado. Seasoning and other foods Herbs. Spices. Seasoning mixes without salt. Unsalted popcorn and pretzels. Fat-free sweets. What foods are not recommended? The items listed may not be a complete list. Talk with your dietitian about what dietary choices are best for you. Grains Baked goods made with fat, such as croissants, muffins, or some breads. Dry pasta or rice meal packs. Vegetables Creamed or fried vegetables. Vegetables in a cheese sauce. Regular canned vegetables (not low-sodium or reduced-sodium). Regular canned tomato sauce and paste (not low-sodium or reduced-sodium). Regular tomato and vegetable juice (not low-sodium or reduced-sodium). Angie Fava. Olives. Fruits Canned fruit in a light or heavy syrup. Fried fruit. Fruit in cream or butter sauce. Meat and other protein foods Fatty cuts of meat. Ribs. Fried meat. Berniece Salines. Sausage. Bologna and other processed lunch meats. Salami. Fatback. Hotdogs. Bratwurst.  Salted nuts and seeds. Canned beans with added salt. Canned or smoked fish. Whole eggs or egg yolks. Chicken or [place] with skin. Dairy Whole or 2% milk, cream, and half-and-half. Whole or full-fat cream cheese. Whole-fat or sweetened yogurt. Full-fat cheese. Nondairy creamers. Whipped toppings. Processed cheese and cheese spreads. Fats and oils Butter. Stick margarine. Lard. Shortening. Ghee. Bacon fat. Tropical oils, such as coconut, palm kernel, or palm oil. Seasoning and other foods Salted popcorn and pretzels. Onion salt, garlic salt, seasoned salt, table salt, and sea salt. Worcestershire sauce. Tartar sauce. Barbecue sauce. Teriyaki sauce. Soy sauce, including reduced-sodium. Steak sauce. Canned and packaged gravies. Fish sauce. Oyster sauce. Cocktail sauce. Horseradish that you find on the shelf. Ketchup. Mustard. Meat flavorings and tenderizers. Bouillon cubes. Hot sauce and Tabasco sauce. Premade or packaged marinades. Premade or packaged taco seasonings. Relishes. Regular salad dressings. Where to find more information:  National Heart, Lung, and [place]: https://wilson-eaton.com/  American Heart Association: www.heart.org Summary  The DASH eating plan is a healthy eating plan that has been shown to reduce high blood pressure (hypertension). It may also reduce your risk for type 2 diabetes, heart disease, and stroke.  With the DASH eating plan, you should limit salt (sodium) intake to 2,300 mg a day. If you  have hypertension, you may need to reduce your sodium intake to 1,500 mg a day.  When on the DASH eating plan, aim to eat more fresh fruits and vegetables, whole grains, lean proteins, low-fat dairy, and heart-healthy fats.  Work with your health care provider or diet and nutrition specialist (dietitian) to adjust your eating plan to your individual calorie needs. This information is not intended to replace advice given to you by your health care provider. Make sure you discuss any  questions you have with your health care provider. Document Revised: 10/22/2017 Document Reviewed: 11/02/2016 Elsevier Patient Education  2020 [employer].

## 2020-05-15 NOTE — Assessment & Plan Note (Addendum)
-   Last lipid panel wnl's. - Continue Vytorin 10-20 mg. - Follow heart healthy diet and stay as active as possible.

## 2020-05-28 LAB — POCT GLYCOSYLATED HEMOGLOBIN (HGB A1C): Hemoglobin A1C: 6.1 % — AB (ref 4.0–5.6)

## 2020-06-06 ENCOUNTER — Telehealth: Payer: Self-pay | Admitting: Physician Assistant

## 2020-06-06 ENCOUNTER — Telehealth: Payer: Self-pay

## 2020-06-06 DIAGNOSIS — E1159 Type 2 diabetes mellitus with other circulatory complications: Secondary | ICD-10-CM

## 2020-06-06 MED ORDER — VALSARTAN 320 MG PO TABS: 320.0000 mg | ORAL_TABLET | Freq: Every day | ORAL | 1 refills | Status: DC

## 2020-06-06 NOTE — Telephone Encounter (Signed)
Pt informed of dosage change, to which she is agreeable.  Please send new RX to [facility].  Advised pt that she may take 2 tablets of her current dosage of 160mg  to equal the 320mg  dosage until she receives the new RX.  Pt expressed understanding and is agreeable.  Charyl Bigger, CMA

## 2020-06-06 NOTE — Telephone Encounter (Signed)
Increased antihypertensive medication- Valsartan to 320 mg. Sent new rx

## 2020-06-07 ENCOUNTER — Other Ambulatory Visit: Payer: Self-pay | Admitting: Adult Health

## 2020-06-08 ENCOUNTER — Other Ambulatory Visit: Payer: Self-pay | Admitting: Internal Medicine

## 2020-06-08 ENCOUNTER — Other Ambulatory Visit: Payer: Self-pay | Admitting: Adult Health

## 2020-06-10 LAB — HM DIABETES EYE EXAM

## 2020-06-13 ENCOUNTER — Encounter: Payer: Self-pay | Admitting: Physician Assistant

## 2020-07-31 ENCOUNTER — Telehealth: Payer: Self-pay | Admitting: Physician Assistant

## 2020-07-31 ENCOUNTER — Other Ambulatory Visit: Payer: Self-pay

## 2020-07-31 ENCOUNTER — Telehealth: Payer: Self-pay | Admitting: Gastroenterology

## 2020-07-31 DIAGNOSIS — R194 Change in bowel habit: Secondary | ICD-10-CM

## 2020-07-31 DIAGNOSIS — R197 Diarrhea, unspecified: Secondary | ICD-10-CM

## 2020-07-31 NOTE — Telephone Encounter (Signed)
Patient calling with IBS symptoms and requesting advise. Patient has Gertie Fey provider she sees for this issue. I advised patient to call Dr. Loletha Carrow. Patient verbalized understanding. AS, CMA

## 2020-07-31 NOTE — Telephone Encounter (Signed)
She may have some kind of infection.  Please arrange GI pathogen panel and C. Difficile PCR.  Bring sample today or tomorrow.  Stay hydrated with oral fluids.   Hold off on anti-diarrheal such as imodium until we know that C diff is negative.  If at any point she thinks she might be dehydrated, she should get someone to bring her to the ED.  - HD

## 2020-07-31 NOTE — Telephone Encounter (Signed)
Spoke she states that she has had diarrhea off and on for two weeks, thought it was irritable bowel. Diarrhea every hour almost, large amounts, foul smelling. Pt states that she had the most awful bowel movement and didn't want to explain. No recent antibiotics, no fever, abdominal pain when she has to go to the bathroom, extreme urgency, pt has experienced some fecal incontinence, weakness. Pt states that she is still taking her probiotic and Protonix. Pt advised to push fluids due to diarrhea. Pt had colon in 12/2019. Please advise, thank you.

## 2020-07-31 NOTE — Telephone Encounter (Signed)
Spoke with patient she is aware that she will need to go by the lab for stool studies either today or early tomorrow. Pt advised to continue to hydrate with oral fluids and to hold off on any anti-diarrheals. Pt states that she has not taken any anti-diarrheals. Advised patient that if she feels like she may be getting dehydrated have someone take her to the ED. Pt verbalized understanding, states that she will go by the lab today to pick up specimen container and will leave sample if she is able. Lab order in epic.

## 2020-08-01 ENCOUNTER — Other Ambulatory Visit: Payer: Medicare HMO

## 2020-08-01 DIAGNOSIS — R194 Change in bowel habit: Secondary | ICD-10-CM

## 2020-08-01 DIAGNOSIS — R197 Diarrhea, unspecified: Secondary | ICD-10-CM

## 2020-08-05 ENCOUNTER — Other Ambulatory Visit: Payer: Self-pay

## 2020-08-05 ENCOUNTER — Other Ambulatory Visit: Payer: Medicare HMO

## 2020-08-05 DIAGNOSIS — R194 Change in bowel habit: Secondary | ICD-10-CM

## 2020-08-05 DIAGNOSIS — R197 Diarrhea, unspecified: Secondary | ICD-10-CM

## 2020-08-06 LAB — GI PROFILE, STOOL, PCR
Adenovirus F 40/41: NOT DETECTED
Astrovirus: NOT DETECTED
C difficile toxin A/B: NOT DETECTED
Campylobacter: NOT DETECTED
Cryptosporidium: NOT DETECTED
Cyclospora cayetanensis: NOT DETECTED
E coli O157: NOT DETECTED
Entamoeba histolytica: NOT DETECTED
Enteroaggregative E coli: NOT DETECTED
Enteropathogenic E coli: NOT DETECTED
Enterotoxigenic E coli: NOT DETECTED
Giardia lamblia: NOT DETECTED
Norovirus GI/GII: NOT DETECTED
Plesiomonas shigelloides: NOT DETECTED
Rotavirus A: NOT DETECTED
Salmonella: NOT DETECTED
Sapovirus: NOT DETECTED
Shiga-toxin-producing E coli: DETECTED — AB
Shigella/Enteroinvasive E coli: NOT DETECTED
Vibrio cholerae: NOT DETECTED
Vibrio: NOT DETECTED
Yersinia enterocolitica: NOT DETECTED

## 2020-08-06 LAB — CLOSTRIDIUM DIFFICILE BY PCR: Toxigenic C. Difficile by PCR: NEGATIVE

## 2020-08-07 ENCOUNTER — Other Ambulatory Visit: Payer: Self-pay | Admitting: Gastroenterology

## 2020-08-07 MED ORDER — DICYCLOMINE HCL 10 MG PO CAPS: 10.0000 mg | ORAL_CAPSULE | Freq: Three times a day (TID) | ORAL | 1 refills | Status: DC | PRN

## 2020-08-07 NOTE — Telephone Encounter (Signed)
Patient is requesting to speak with you did not say anything else

## 2020-08-07 NOTE — Telephone Encounter (Signed)
Spoke with patient, see 08/01/20 result note for more information.

## 2020-08-09 LAB — CLOSTRIDIUM DIFFICILE BY PCR

## 2020-08-09 LAB — SPECIMEN STATUS REPORT

## 2020-08-15 ENCOUNTER — Ambulatory Visit: Payer: Medicare HMO | Admitting: Physician Assistant

## 2020-08-25 ENCOUNTER — Other Ambulatory Visit: Payer: Self-pay

## 2020-08-25 ENCOUNTER — Emergency Department (HOSPITAL_COMMUNITY)
Admission: EM | Admit: 2020-08-25 | Discharge: 2020-08-25 | Disposition: A | Discharge status: Home / Self Care | Payer: Medicare HMO | Attending: Emergency Medicine | Admitting: Emergency Medicine

## 2020-08-25 ENCOUNTER — Encounter (HOSPITAL_COMMUNITY): Payer: Self-pay | Admitting: Emergency Medicine

## 2020-08-25 ENCOUNTER — Emergency Department (HOSPITAL_COMMUNITY): Payer: Medicare HMO

## 2020-08-25 DIAGNOSIS — Z7982 Long term (current) use of aspirin: Secondary | ICD-10-CM | POA: Insufficient documentation

## 2020-08-25 DIAGNOSIS — Z79899 Other long term (current) drug therapy: Secondary | ICD-10-CM | POA: Insufficient documentation

## 2020-08-25 DIAGNOSIS — I5031 Acute diastolic (congestive) heart failure: Secondary | ICD-10-CM | POA: Insufficient documentation

## 2020-08-25 DIAGNOSIS — Z96653 Presence of artificial knee joint, bilateral: Secondary | ICD-10-CM | POA: Diagnosis not present

## 2020-08-25 DIAGNOSIS — E119 Type 2 diabetes mellitus without complications: Secondary | ICD-10-CM | POA: Diagnosis not present

## 2020-08-25 DIAGNOSIS — R072 Precordial pain: Secondary | ICD-10-CM | POA: Insufficient documentation

## 2020-08-25 DIAGNOSIS — Z87891 Personal history of nicotine dependence: Secondary | ICD-10-CM | POA: Diagnosis not present

## 2020-08-25 DIAGNOSIS — R079 Chest pain, unspecified: Secondary | ICD-10-CM

## 2020-08-25 DIAGNOSIS — I110 Hypertensive heart disease with heart failure: Secondary | ICD-10-CM | POA: Insufficient documentation

## 2020-08-25 LAB — BASIC METABOLIC PANEL
Anion gap: 12 (ref 5–15)
BUN: 13 mg/dL (ref 8–23)
CO2: 29 mmol/L (ref 22–32)
Calcium: 9.4 mg/dL (ref 8.9–10.3)
Chloride: 98 mmol/L (ref 98–111)
Creatinine, Ser: 0.9 mg/dL (ref 0.44–1.00)
GFR calc Af Amer: >60 mL/min (ref >60)
GFR calc non Af Amer: 60 mL/min — ABNORMAL LOW (ref >60)
Glucose, Bld: 192 mg/dL — ABNORMAL HIGH (ref 70–99)
Potassium: 3.7 mmol/L (ref 3.5–5.1)
Sodium: 139 mmol/L (ref 135–145)

## 2020-08-25 LAB — CBC
HCT: 42.3 % (ref 36.0–46.0)
Hemoglobin: 13.6 g/dL (ref 12.0–15.0)
MCH: 28.6 pg (ref 26.0–34.0)
MCHC: 32.2 g/dL (ref 30.0–36.0)
MCV: 89.1 fL (ref 80.0–100.0)
Platelets: 245 10*3/uL (ref 150–400)
RBC: 4.75 MIL/uL (ref 3.87–5.11)
RDW: 13.6 % (ref 11.5–15.5)
WBC: 10.5 10*3/uL (ref 4.0–10.5)
nRBC: 0 % (ref 0.0–0.2)

## 2020-08-25 LAB — HEPATIC FUNCTION PANEL
ALT: 13 U/L (ref 0–44)
AST: 30 U/L (ref 15–41)
Albumin: 3.2 g/dL — ABNORMAL LOW (ref 3.5–5.0)
Alkaline Phosphatase: 66 U/L (ref 38–126)
Bilirubin, Direct: 0.3 mg/dL — ABNORMAL HIGH (ref 0.0–0.2)
Indirect Bilirubin: 0.9 mg/dL (ref 0.3–0.9)
Total Bilirubin: 1.2 mg/dL (ref 0.3–1.2)
Total Protein: 7.4 g/dL (ref 6.5–8.1)

## 2020-08-25 LAB — TROPONIN I (HIGH SENSITIVITY)
Troponin I (High Sensitivity): 87 ng/L — ABNORMAL HIGH (ref <18)
Troponin I (High Sensitivity): 85 ng/L — ABNORMAL HIGH (ref <18)

## 2020-08-25 LAB — LIPASE, BLOOD: Lipase: 26 U/L (ref 11–51)

## 2020-08-25 LAB — BRAIN NATRIURETIC PEPTIDE: B Natriuretic Peptide: 90 pg/mL (ref 0.0–100.0)

## 2020-08-25 MED ORDER — ONDANSETRON HCL 4 MG PO TABS: 4.0000 mg | ORAL_TABLET | Freq: Four times a day (QID) | ORAL | 0 refills | Status: AC

## 2020-08-25 MED ORDER — LIDOCAINE VISCOUS HCL 2 % MT SOLN
15.0000 mL | Freq: Once | OROMUCOSAL | Status: AC
  Administered 2020-08-25: 15 mL via ORAL
  Filled 2020-08-25: qty 15

## 2020-08-25 MED ORDER — SODIUM CHLORIDE 0.9 % IV BOLUS
500.0000 mL | Freq: Once | INTRAVENOUS | Status: AC
  Administered 2020-08-25: 500 mL via INTRAVENOUS

## 2020-08-25 MED ORDER — SUCRALFATE 1 G PO TABS: 1.0000 g | ORAL_TABLET | Freq: Three times a day (TID) | ORAL | 0 refills | Status: DC

## 2020-08-25 MED ORDER — ONDANSETRON HCL 4 MG/2ML IJ SOLN
4.0000 mg | Freq: Once | INTRAMUSCULAR | Status: AC
  Administered 2020-08-25: 4 mg via INTRAVENOUS
  Filled 2020-08-25: qty 2

## 2020-08-25 MED ORDER — ALUM & MAG HYDROXIDE-SIMETH 200-200-20 MG/5ML PO SUSP
30.0000 mL | Freq: Once | ORAL | Status: AC
  Administered 2020-08-25: 30 mL via ORAL
  Filled 2020-08-25: qty 30

## 2020-08-25 NOTE — ED Provider Notes (Signed)
[facility] EMERGENCY DEPARTMENT Provider Note   CSN: [id number] Arrival date & time: 08/25/20  4081     History Chief Complaint  Patient presents with  . Chest Pain    Andrea Brooks is a 84 y.o. female.  The history is provided by the patient.  Chest Pain Pain location:  Substernal area Pain quality: burning   Radiates to: throat. Pain severity:  Mild Onset quality:  Gradual Duration:  10 hours Timing:  Constant Progression:  Improving Chronicity:  New Context: at rest   Relieved by:  Nothing Worsened by:  Nothing Associated symptoms: nausea and vomiting   Associated symptoms: no abdominal pain, no back pain, no cough, no fever, no palpitations and no shortness of breath   Risk factors: coronary artery disease (non-obstructive), diabetes mellitus, high cholesterol and hypertension        Past Medical History:  Diagnosis Date  . Anxiety   . Colon polyps   . Diabetes mellitus without complication ([place])   . Diverticulosis   . Food poisoning   . Gallstones   . GERD (gastroesophageal reflux disease)   . Hyperlipidemia   . Hypertension   . Obesity   . 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 Encounter for Medicare annual wellness exam 04/04/2019  . Chronic thoracic back pain 01/03/2019  . Acute congestive heart failure ([place])   . Non-ST elevation (NSTEMI) myocardial infarction ([place])   . Acute diastolic (congestive) heart failure ([place]) 07/02/2018  . Elevated troponin 07/02/2018  . Diabetes mellitus without complication ([place]) 44/81/8563  . Diverticulitis-  mild symptoms 03/30/2018  . Irritable bowel syndrome with both constipation and diarrhea 03/30/2018  . Healthcare maintenance 03/17/2018  . Pain of right heel 07/06/2017  . Loose stools 06/24/2017  . Right-sided thoracic back pain 06/24/2017  . Hyperlipidemia 04/22/2017  . GERD  (gastroesophageal reflux disease) 04/22/2017  . Impaired glucose metabolism 04/22/2017  . Hypertension associated with diabetes ([place]) 04/22/2017  . Anxiety 04/22/2017    Past Surgical History:  Procedure Laterality Date  . ABDOMINAL HYSTERECTOMY     total  . BREAST BIOPSY Left    neg  . CHOLECYSTECTOMY    . JOINT REPLACEMENT Bilateral    knee  . LEFT HEART CATH AND CORONARY ANGIOGRAPHY N/A 07/04/2018   Procedure: LEFT HEART CATH AND CORONARY ANGIOGRAPHY;  Surgeon: Jettie Booze, MD;  Location: [facility];  Service: Cardiovascular;  Laterality: N/A;  . REPLACEMENT TOTAL KNEE BILATERAL       OB History   No obstetric history on file.     Family History  Problem Relation Age of Onset  . Hypertension Mother   . Colon cancer Mother   . Stroke Father   . Heart failure Father   . Stroke Sister   . Stroke Brother   . Graves' disease Daughter   . Breast cancer Paternal Aunt   . Graves' disease Sister   . Bone cancer Sister   . Breast cancer Maternal Aunt   . Esophageal cancer Neg Hx   . Rectal cancer Neg Hx   . Liver cancer Neg Hx     Social History   Tobacco Use  . Smoking status: Former Smoker    Packs/day: 1.00    Years: 2.00    Pack years: 2.00    Quit date: 11/24/1959    Years  since quitting: 60.7  . Smokeless tobacco: Never Used  Vaping Use  . Vaping Use: Never used  Substance Use Topics  . Alcohol use: No  . Drug use: No    Home Medications Prior to Admission medications   Medication Sig Start Date End Date Taking? Authorizing Provider  acetaminophen (TYLENOL) 500 MG tablet Take 1,000 mg by mouth every 6 (six) hours as needed for moderate pain.   Yes [provider]  alum & mag hydroxide-simeth (MYLANTA) 200-200-20 MG/5ML suspension Take 30 mLs by mouth every 6 (six) hours as needed for indigestion or heartburn.   Yes [provider]  aspirin EC 81 MG tablet Take 81 mg by mouth daily.   Yes [provider]  calcium  carbonate (TUMS - DOSED IN MG ELEMENTAL CALCIUM) 500 MG chewable tablet Chew 2 tablets by mouth as needed for indigestion or heartburn.   Yes [provider]  carvedilol (COREG) 3.125 MG tablet TAKE 1 TABLET TWICE A DAY WITH MEALS Patient taking differently: Take 3.125 mg by mouth 2 (two) times daily after a meal.  06/10/20  Yes Hilty, Nadean Corwin, MD  dicyclomine (BENTYL) 10 MG capsule Take 1 capsule (10 mg total) by mouth 3 (three) times daily as needed for spasms (IBS flareup). Future refills need to come from GI 08/07/20  Yes Danis, Estill Cotta III, MD  ezetimibe-simvastatin (VYTORIN) 10-20 MG tablet TAKE 1 TABLET DAILY Patient taking differently: Take 1 tablet by mouth daily.  03/11/20  Yes Abonza, Maritza, PA-C  LORazepam (ATIVAN) 1 MG tablet 1/2 tablet daily as needed for anxiety Patient taking differently: Take 0.5 mg by mouth daily as needed for anxiety.  11/07/19  Yes Danford, [name] D, NP  metoCLOPramide (REGLAN) 5 MG tablet Take 1 tablet (5 mg total) by mouth every 8 (eight) hours as needed for nausea. 02/16/19  Yes Danford, Berna Spare, NP  Multiple Vitamins-Minerals (MULTI FOR HER 50+) TABS Take 1 tablet by mouth daily.   Yes [provider]  pantoprazole (PROTONIX) 20 MG tablet Take 1 tablet (20 mg total) by mouth daily. Patient taking differently: Take 20 mg by mouth daily before breakfast.  05/15/20  Yes Abonza, Maritza, PA-C  Probiotic Product (SOLUBLE FIBER/PROBIOTICS) CHEW Chew 2 tablets by mouth daily.   Yes [provider]  valsartan (DIOVAN) 320 MG tablet Take 1 tablet (320 mg total) by mouth daily. 06/06/20  Yes Abonza, Maritza, PA-C  ondansetron (ZOFRAN) 4 MG tablet Take 1 tablet (4 mg total) by mouth every 6 (six) hours for 15 doses. 08/25/20 08/29/20  Juwon Scripter, DO  Probiotic Product (PROBIOTIC DAILY PO) Take by mouth. Patient not taking: Reported on 08/25/2020    [provider]  sucralfate (CARAFATE) 1 g tablet Take 1 tablet (1 g total) by mouth 4  (four) times daily -  with meals and at bedtime for 14 days. 08/25/20 09/08/20  Lennice Sites, DO    Allergies    Tape  Review of Systems   Review of Systems  Constitutional: Negative for chills and fever.  HENT: Negative for ear pain and sore throat.   Eyes: Negative for pain and visual disturbance.  Respiratory: Negative for cough and shortness of breath.   Cardiovascular: Positive for chest pain. Negative for palpitations.  Gastrointestinal: Positive for nausea and vomiting. Negative for abdominal pain.  Genitourinary: Negative for dysuria and hematuria.  Musculoskeletal: Negative for arthralgias and back pain.  Skin: Negative for color change and rash.  Neurological: Negative for seizures and syncope.  All other systems reviewed and are negative.   Physical Exam Updated Vital Signs  ED Triage Vitals  Enc Vitals Group     BP 08/25/20 0724 (!) 132/111     Pulse Rate 08/25/20 0724 94     Resp 08/25/20 0724 20     Temp 08/25/20 0724 98.9 F (37.2 C)     Temp Source 08/25/20 0724 Oral     SpO2 08/25/20 0724 99 %     Weight 08/25/20 0724 166 lb (75.3 kg)     Height 08/25/20 0724 5\' 4"  (1.626 m)     Head Circumference --      Peak Flow --      Pain Score 08/25/20 0726 0     Pain Loc --      Pain Edu? --      Excl. in [place]? --     Physical Exam Vitals and nursing note reviewed.  Constitutional:      General: She is not in acute distress.    Appearance: She is well-developed. She is not ill-appearing.  HENT:     Head: Normocephalic and atraumatic.  Eyes:     Conjunctiva/sclera: Conjunctivae normal.     Pupils: Pupils are equal, round, and reactive to light.  Cardiovascular:     Rate and Rhythm: Normal rate and regular rhythm.     Pulses:          Radial pulses are 2+ on the right side and 2+ on the left side.     Heart sounds: No murmur heard.   Pulmonary:     Effort: Pulmonary effort is normal. No respiratory distress.     Breath sounds: Normal breath sounds. No  decreased breath sounds, wheezing or rhonchi.  Abdominal:     Palpations: Abdomen is soft.     Tenderness: There is no abdominal tenderness.  Musculoskeletal:        General: Normal range of motion.     Cervical back: Neck supple.     Right lower leg: No edema.     Left lower leg: No edema.  Skin:    General: Skin is warm and dry.     Capillary Refill: Capillary refill takes less than 2 seconds.  Neurological:     General: No focal deficit present.     Mental Status: She is alert.     ED Results / Procedures / Treatments   Labs (all labs ordered are listed, but only abnormal results are displayed) Labs Reviewed  BASIC METABOLIC PANEL - Abnormal; Notable for the following components:      Result Value   Glucose, Bld 192 (*)    GFR calc non Af Amer 60 (*)    All other components within normal limits  HEPATIC FUNCTION PANEL - Abnormal; Notable for the following components:   Albumin 3.2 (*)    Bilirubin, Direct 0.3 (*)    All other components within normal limits  TROPONIN I (HIGH SENSITIVITY) - Abnormal; Notable for the following components:   Troponin I (High Sensitivity) 87 (*)    All other components within normal limits  TROPONIN I (HIGH SENSITIVITY) - Abnormal; Notable for the following components:   Troponin I (High Sensitivity) 85 (*)    All other components within normal limits  CBC  BRAIN NATRIURETIC PEPTIDE  LIPASE, BLOOD    EKG EKG Interpretation  Date/Time:  Sunday August 25 2020 07:23:07 EDT Ventricular Rate:  92 PR Interval:  228 QRS Duration: 98 QT Interval:  360 QTC Calculation: 445 R Axis:   -63 Text Interpretation: Sinus rhythm with 1st degree A-V block Left axis deviation Left ventricular hypertrophy ( R in aVL , Cornell product , Romhilt-Estes ) Cannot rule out Septal infarct , age undetermined Possible Lateral infarct , age undetermined Inferior infarct , age undetermined Abnormal ECG Confirmed by Lennice Sites [phone]) on 08/25/2020 11:29:48  AM   Radiology DG Chest 2 View  Result Date: 08/25/2020 CLINICAL DATA:  Chest pain and dyspnea EXAM: CHEST - 2 VIEW COMPARISON:  07/02/2018 chest radiograph. FINDINGS: Stable cardiomediastinal silhouette with mild cardiomegaly. No pneumothorax. No pleural effusion. Cephalization of the pulmonary vasculature without overt pulmonary edema. Faint reticular opacities in the peripheral lungs bilaterally. No acute consolidative airspace disease. IMPRESSION: 1. Stable mild cardiomegaly without overt pulmonary edema. 2. Faint reticular opacities in the peripheral lungs bilaterally, cannot exclude interstitial lung disease. Electronically Signed   By: Ilona Sorrel M.D.   On: 08/25/2020 07:46    Procedures Procedures (including critical care time)  Medications Ordered in ED Medications  alum & mag hydroxide-simeth (MAALOX/MYLANTA) 200-200-20 MG/5ML suspension 30 mL (30 mLs Oral Given 08/25/20 1205)    And  lidocaine (XYLOCAINE) 2 % viscous mouth solution 15 mL (15 mLs Oral Given 08/25/20 1205)  sodium chloride 0.9 % bolus 500 mL (500 mLs Intravenous New Bag/Given 08/25/20 1204)  ondansetron (ZOFRAN) injection 4 mg (4 mg Intravenous Given 08/25/20 1205)    ED Course  I have reviewed the triage vital signs and the nursing notes.  Pertinent labs & imaging results that were available during my care of the patient were reviewed by me and considered in my medical decision making (see chart for details).    MDM Rules/Calculators/A&P                          Pola Shuster is an 84 year old female with history of high cholesterol, diabetes, reflux, high cholesterol presents the ED with chest pain.  Normal vitals.  No fever.  States burning chest pain that started overnight after eating salsa.  Feels like her reflux pain.  No diaphoresis, no radiation of the pain.  Patient had lab work done prior to my evaluation that shows mild elevation of troponin to 87.  However EKG shows sinus rhythm.  No ischemic  changes.  She did have a cardiac catheterization 2 years ago that showed mild nonobstructive disease.  No significant anemia, electrolyte abnormality, kidney injury.  No shortness of breath.  No PE risk factors.  Chest x-ray showed no acute pneumonia, no pneumothorax, no pleural effusion.  No infectious symptoms.  Patient was given GI cocktail with great improvement of her pain.  Repeat troponin stable and actually improved 85.  Overall lab work unremarkable.  History and physical more consistent with acid reflux.  Talked with cardiology on the phone who agrees with this assessment given that patient is stable troponins and unremarkable heart cath 2 years ago and improvement with GI cocktail suspect that this is GERD related.  We will have her follow-up with cardiology outpatient otherwise.  She is already on PPI and will prescribe Carafate, zofran.  Discharged in ED in good condition.  Understands return precautions.  This chart was dictated using voice recognition software.  Despite best efforts to proofread,  errors can occur which can change the documentation meaning.    Final Clinical Impression(s) / ED Diagnoses Final diagnoses:  Nonspecific chest pain    Rx / DC Orders ED  Discharge Orders         Ordered    sucralfate (CARAFATE) 1 g tablet  3 times daily with meals & bedtime        08/25/20 1308    ondansetron (ZOFRAN) 4 MG tablet  Every 6 hours        08/25/20 1308           Bodey Frizell, DO 08/25/20 1308

## 2020-08-25 NOTE — ED Triage Notes (Signed)
Pt to triage via [facility] from home.  Reports burning sensation to center of chest since last night with SOB, nausea, and vomited brown emesis.  History of GERD- took Mylanta without relief.  Pain free at this time.

## 2020-08-26 ENCOUNTER — Telehealth: Payer: Self-pay | Admitting: Gastroenterology

## 2020-08-26 DIAGNOSIS — K219 Gastro-esophageal reflux disease without esophagitis: Secondary | ICD-10-CM

## 2020-08-26 MED ORDER — OMEPRAZOLE 20 MG PO CPDR: DELAYED_RELEASE_CAPSULE | ORAL | 3 refills | Status: DC

## 2020-08-26 NOTE — Telephone Encounter (Signed)
Pt is requesting a call back from a nurse to discuss how she should take the medication sulcralfate. Pt also has some concerns vomiting severely.

## 2020-08-26 NOTE — Telephone Encounter (Signed)
Spoke with patient, she states that on 10/1 at 3 am she woke up with vomiting and again at 6 am. On 10/2 no vomiting all day,on 10/3 5:30 am filled 2 gallon bucket half full, dark brown vomit, half way filled 2 gallon container. Pt was seen in the ED and was told this is acid reflux related rather than heart issues. Pt states that her sister had similar issues and states that her sister was prescribed Omeprazole. Told patient that it looks like she was prescribed Pantoprazole around June, asked if she was taking it daily 30 minutes before breakfast, she stated that she was not taking it that way, she states that she just did not like Pantoprazole and wanting to switch to Omeprazole, she states that she trusts her sisters judgement with this. RX has been sent to local pharmacy per pt request. Pt states that she didn't know if she needed the sucralfate that was prescribed from the hospital. Advised patient she needs to take Sucralfate and Zofran as directed as this will help with her symptoms, advised that this will take a couple of days to get in her system. Pt had no other concerns at this time.

## 2020-08-27 NOTE — Telephone Encounter (Signed)
Spoke with patient's daughter, advised that she should contact the pharmacist regarding medication interactions. She states that she was concerned because her mom is on several medications for the same issue. Daughter verbalized understanding and states that she will contact the pharmacist.

## 2020-08-27 NOTE — Telephone Encounter (Signed)
Pt's daughter is requesting a call back from the nurse to verify that 4 of her medications do not interact with each other.  Sucralfate Omeprazole Metoclopramide Zofran

## 2020-08-29 NOTE — Progress Notes (Signed)
Cardiology Office Note   Date:  09/05/2020   ID:  Andrea Brooks, DOB February 28, 1936, MRN [id number]  PCP:  Lorrene Reid, PA-C  Cardiologist:  Pixie Casino, MD EP: None  Chief Complaint  Patient presents with  . Follow-up    Recent ED visit for chest pain      History of Present Illness: Andrea Brooks is a 84 y.o. female with PMH of non-obstructive CAD, chronic diastolic CHF, HTN, HLD, DM type 2, GERD, and anxiety, who presents for post-ED visit follow-up of her chest pain.  She was last evaluated by cardiology at an outpatient visit with Dr. Debara Pickett 03/2020, at which time she was without anginal or volume overload complaints. No medication changes occurred at this visit and she was recommended to follow-up in 1 year or sooner if needed. Since that time she was evaluated in the ED 08/25/20 for chest pain described as a burning sensation in her chest which she felt was c/w reflux. HsTrop was elevated to 87 and trended down - trend not c/w ACS. EKG was non-ischemic. The ED curbsided cardiology who agreed symptoms seemed c/w GI etiology and patient was stable to be discharged home. Her last ischemic evaluation was a LHC 06/2018 which showed 25% pLAD, 10% mRCA, and 10% p-mLCx stenosis. Echo at that time showed EF 60-65%, indeterminate LV diastolic function, no RWMA, mild MR, and elevated PA pressures.   She present today for follow-up of her chest pain. She reports an episode of vomiting starting 08/23/20 that progressed for 2 days. Unable to keep anything down. Frequent bilious emesis followed by onset of burning chest pain. She reports no further episodes since her ED visit. She was started on antacids which seems to be helping. No complaints of exertional chest pain. She denies SOB, DOE, orthopnea, PND, LE edema, dizziness, lightheadedness, syncope, or palpitations. Blood pressure was mildly elevated in the ED. She reports SBP in the 160s-170s at home.    Past Medical History:  Diagnosis  Date  . Anxiety   . Colon polyps   . Diabetes mellitus without complication ([place])   . Diverticulosis   . Food poisoning   . Gallstones   . GERD (gastroesophageal reflux disease)   . Hyperlipidemia   . Hypertension   . Obesity   . 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 ABDOMINAL HYSTERECTOMY     total  . BREAST BIOPSY Left    neg  . CHOLECYSTECTOMY    . JOINT REPLACEMENT Bilateral    knee  . LEFT HEART CATH AND CORONARY ANGIOGRAPHY N/A 07/04/2018   Procedure: LEFT HEART CATH AND CORONARY ANGIOGRAPHY;  Surgeon: Jettie Booze, MD;  Location: [facility];  Service: Cardiovascular;  Laterality: N/A;  . REPLACEMENT TOTAL KNEE BILATERAL       Current Outpatient Medications  Medication Sig Dispense Refill  . acetaminophen (TYLENOL) 500 MG tablet Take 1,000 mg by mouth every 6 (six) hours as needed for moderate pain.    Marland Kitchen alum & mag hydroxide-simeth (MYLANTA) 528-413-24 MG/5ML suspension Take 30 mLs by mouth every 6 (six) hours as needed for indigestion or heartburn.    Marland Kitchen aspirin EC 81 MG tablet Take 81 mg by mouth daily.    . calcium carbonate (TUMS - DOSED IN MG ELEMENTAL CALCIUM) 500 MG chewable tablet Chew 2 tablets by mouth as needed for indigestion  or heartburn.    . carvedilol (COREG) 3.125 MG tablet TAKE 1 TABLET TWICE A DAY WITH MEALS (Patient taking differently: Take 3.125 mg by mouth 2 (two) times daily after a meal. ) 180 tablet 3  . dicyclomine (BENTYL) 10 MG capsule Take 1 capsule (10 mg total) by mouth 3 (three) times daily as needed for spasms (IBS flareup). Future refills need to come from GI 30 capsule 1  . ezetimibe-simvastatin (VYTORIN) 10-20 MG tablet TAKE 1 TABLET DAILY (Patient taking differently: Take 1 tablet by mouth daily. ) 90 tablet 0  . LORazepam (ATIVAN) 1 MG tablet 1/2 tablet daily as needed for anxiety (Patient taking differently:  Take 0.5 mg by mouth daily as needed for anxiety. ) 30 tablet 0  . metoCLOPramide (REGLAN) 5 MG tablet Take 1 tablet (5 mg total) by mouth every 8 (eight) hours as needed for nausea. 12 tablet 0  . Multiple Vitamins-Minerals (MULTI FOR HER 50+) TABS Take 1 tablet by mouth daily.    Marland Kitchen omeprazole (PRILOSEC) 20 MG capsule Take 1 capsule by mouth daily, 30 minutes before breakfast. 90 capsule 3  . Probiotic Product (PROBIOTIC DAILY PO) Take by mouth.     . Probiotic Product (SOLUBLE FIBER/PROBIOTICS) CHEW Chew 2 tablets by mouth daily.    . sucralfate (CARAFATE) 1 g tablet Take 1 tablet (1 g total) by mouth 4 (four) times daily -  with meals and at bedtime for 14 days. 56 tablet 0  . valsartan (DIOVAN) 320 MG tablet Take 1 tablet (320 mg total) by mouth daily. 90 tablet 1   No current facility-administered medications for this visit.    Allergies:   Tape    Social History:  The patient  reports that she quit smoking about 60 years ago. She has a 2.00 pack-year smoking history. She has never used smokeless tobacco. She reports that she does not drink alcohol and does not use drugs.   Family History:  The patient's family history includes Bone cancer in her sister; Breast cancer in her maternal aunt and paternal aunt; Colon cancer in her mother; Berenice Primas' disease in her daughter and sister; Heart failure in her father; Hypertension in her mother; Stroke in her brother, father, and sister.    ROS:  Please see the history of present illness.   Otherwise, review of systems are positive for none.   All other systems are reviewed and negative.    PHYSICAL EXAM: VS:  BP 132/66   Pulse 72   Ht 5\' 4"  (1.626 m)   Wt 163 lb (73.9 kg)   SpO2 96%   BMI 27.98 kg/m  , BMI Body mass index is 27.98 kg/m. GEN: Well nourished, well developed, in no acute distress HEENT: sclera anicteric Neck: no JVD, carotid bruits, or masses Cardiac: RRR; no murmurs, rubs, or gallops, no edema  Respiratory:  clear to  auscultation bilaterally, normal work of breathing GI: soft, nontender, nondistended, + BS MS: no deformity or atrophy Skin: warm and dry, no rash Neuro:  Strength and sensation are intact Psych: euthymic mood, full affect   EKG:  EKG is not ordered today.   Recent Labs: 11/07/2019: Magnesium 1.8; TSH 1.620 08/25/2020: ALT 13; B Natriuretic Peptide 90.0; BUN 13; Creatinine, Ser 0.90; Hemoglobin 13.6; Platelets 245; Potassium 3.7; Sodium 139    Lipid Panel    Component Value Date/Time   CHOL 102 11/07/2019 1043   TRIG 133 11/07/2019 1043   HDL 28 (L) 11/07/2019 1043   CHOLHDL 3.6  11/07/2019 1043   CHOLHDL 3.5 07/04/2018 0526   VLDL 26 07/04/2018 0526   LDLCALC 50 11/07/2019 1043      Wt Readings from Last 3 Encounters:  09/05/20 163 lb (73.9 kg)  08/25/20 166 lb (75.3 kg)  05/15/20 175 lb 1.6 oz (79.4 kg)      Other studies Reviewed: Additional studies/ records that were reviewed today include:   Echocardiogram 06/2018: - Left ventricle: The cavity size was normal. Wall thickness was  increased in a pattern of moderate LVH. Systolic function was  normal. The estimated ejection fraction was in the range of 60%  to 65%. Diastolic function is abnormal, indeterminant grade.  There is evidence of elevated LA pressure. Wall motion was  normal; there were no regional wall motion abnormalities.  - Aortic valve: Valve area (VTI): 2.01 cm^2. Valve area (Vmax):  2.11 cm^2. Valve area (Vmean): 2.38 cm^2.  - Mitral valve: There was mild regurgitation.  - Left atrium: The atrium was mildly dilated.  - Right ventricle: The cavity size was mildly dilated. Wall  thickness was normal.  - Pulmonary arteries: Systolic pressure was mildly increased. PA  peak pressure: 34 mm Hg (S).  - Technically adequate study.   Left heart catheterization 06/2018:  Prox LAD lesion is 25% stenosed.  Mid RCA lesion is 10% stenosed.  Prox Cx to Mid Cx lesion is 10% stenosed.  LV  end diastolic pressure is mildly elevated. LVEDP 16 mm Hg.  There is no aortic valve stenosis.   Nonobstructive CAD.    Continue aggressive secondary prevention and management of diastolic heart failure.      ASSESSMENT AND PLAN:  1. Chest pain in patient with mild non-obstructive CAD: symptoms seemed c/w GI etiology. Improved with addition of Carafate and omeprazole. HsTrop in the ED were mildly elevated - suspected demand ischemia in the setting of GI illness. EKG was non-ischemic. No anginal complaints.  - Continue aspirin and zetia-simvastatin - Continue carvedilol  2. HTN: BP 132/66 today, though she thinks her SBP at home is typically in the 160s-170s.  - Patient to verify home BP readings - if elevated will consider increasing carvedilol - Continue carvedilol and valsartan - Encouraged her to bring her BP cuff to her next PCP visit in November to make sure it is calibrated correctly.  3. HLD: LDL 50 10/2019 - Continue zetia-simvastatin  4. DM type 2: diet controlled - Continue dietary modifications for blood sugar control  5. GERD: symptoms improved with addition of carafate - Continue omeprazole and carafate   Current medicines are reviewed at length with the patient today.  The patient does not have concerns regarding medicines.  The following changes have been made:  As above  Labs/ tests ordered today include:  No orders of the defined types were placed in this encounter.    Disposition:   FU with Dr. Debara Pickett next May as previously planned.   Signed, Abigail Butts, PA-C  09/05/2020 10:58 AM

## 2020-09-03 ENCOUNTER — Ambulatory Visit: Payer: Medicare HMO | Admitting: Physician Assistant

## 2020-09-05 ENCOUNTER — Ambulatory Visit: Payer: Medicare HMO | Admitting: Medical

## 2020-09-05 ENCOUNTER — Other Ambulatory Visit: Payer: Self-pay

## 2020-09-05 ENCOUNTER — Encounter: Payer: Self-pay | Admitting: Medical

## 2020-09-05 VITALS — BP 132/66 | HR 72 | Ht 64.0 in | Wt 163.0 lb

## 2020-09-05 DIAGNOSIS — I10 Essential (primary) hypertension: Secondary | ICD-10-CM | POA: Diagnosis not present

## 2020-09-05 DIAGNOSIS — I2510 Atherosclerotic heart disease of native coronary artery without angina pectoris: Secondary | ICD-10-CM

## 2020-09-05 DIAGNOSIS — E782 Mixed hyperlipidemia: Secondary | ICD-10-CM | POA: Diagnosis not present

## 2020-09-05 DIAGNOSIS — E119 Type 2 diabetes mellitus without complications: Secondary | ICD-10-CM

## 2020-09-05 DIAGNOSIS — K219 Gastro-esophageal reflux disease without esophagitis: Secondary | ICD-10-CM

## 2020-09-05 NOTE — Patient Instructions (Signed)
Medication Instructions:  The current medical regimen is effective;  continue present plan and medications as directed. Please refer to the Current Medication list given to you today.; *If you need a refill on your cardiac medications before your next appointment, please call your pharmacy*  Lab Work:   Testing/Procedures:  NONE    NONE  Follow-Up: Your next appointment:  AS SCHEDULED IN MAY  In Person with K. Mali Hilty, MD   At [facility], you and your health needs are our priority.  As part of our continuing mission to provide you with exceptional heart care, we have created designated Provider Care Teams.  These Care Teams include your primary Cardiologist (physician) and Advanced Practice Providers (APPs -  Physician Assistants and Nurse Practitioners) who all work together to provide you with the care you need, when you need it.  We recommend signing up for the patient portal called "MyChart".  Sign up information is provided on this After Visit Summary.  MyChart is used to connect with patients for Virtual Visits (Telemedicine).  Patients are able to view lab/test results, encounter notes, upcoming appointments, etc.  Non-urgent messages can be sent to your provider as well.   To learn more about what you can do with MyChart, go to [email or web address].

## 2020-09-06 ENCOUNTER — Telehealth: Payer: Self-pay | Admitting: Medical

## 2020-09-06 NOTE — Telephone Encounter (Addendum)
Called Roby Lofts, PA-C due to her being out of the office to advise on this matter. Daleen Snook is increasing Carvedilol(Coreg) to 6.25 mg BID until patient's next appointment with her PCP. She also want the patient to continue to monitor BP at home with goal still to be 130/80.

## 2020-09-06 NOTE — Telephone Encounter (Signed)
BP reading:  Average of 2 weeks 140/63 Ranged from 128 -155

## 2020-09-06 NOTE — Telephone Encounter (Signed)
Tried calling patient on the number listed for her home number it only rings and there is not an answering machine/voicemail. Called the mobile number listed for the patient and could not leave a voice message due to not having a voicemail box set up. I will try calling the patient again.

## 2020-09-06 NOTE — Telephone Encounter (Signed)
Spoke with patient who states that she was seen in office with Riley Churches yesterday following up from a recent ED visit for elevated Blood pressure. Patient wanted to let Daleen Snook know that her blood pressure has ranged from 174-715 systolic with an average blood pressure of 140/63. Advised patient I would forward this message to Daleen Snook to make her aware.

## 2020-09-12 MED ORDER — CARVEDILOL 6.25 MG PO TABS
6.2500 mg | ORAL_TABLET | Freq: Two times a day (BID) | ORAL | 3 refills | Status: DC
Start: 2020-09-12 — End: 2021-01-07

## 2020-09-12 NOTE — Telephone Encounter (Signed)
I was able to get in contact with the patient to inform her that Roby Lofts, PA-C would like for her to increase her Carvedilol (Coreg) to 6.25 mg 2 times a day until her next appointment with her PCP  and to continue to monitor her blood pressure at home with the goal still be 130/80. Patient asked that I send the new Rx for [facility] to [facility] and stated that she will just take 2 tablets of her 3.125mg  2 times a day totaling 4 tablets daily since she just received the previous dose in the mail. She verbalized understanding and all (if any) questions were answered. New Rx for carvedilol 6.25 mg BID was sent to [facility].

## 2020-09-12 NOTE — Addendum Note (Signed)
Addended by: Jacqulynn Cadet on: 09/12/2020 10:12 AM   Modules accepted: Orders

## 2020-10-02 ENCOUNTER — Encounter: Payer: Self-pay | Admitting: Physician Assistant

## 2020-10-02 ENCOUNTER — Other Ambulatory Visit: Payer: Self-pay

## 2020-10-02 ENCOUNTER — Ambulatory Visit (INDEPENDENT_AMBULATORY_CARE_PROVIDER_SITE_OTHER): Payer: Medicare HMO | Admitting: Physician Assistant

## 2020-10-02 VITALS — BP 130/70 | HR 63 | Ht 64.0 in | Wt 165.2 lb

## 2020-10-02 DIAGNOSIS — E119 Type 2 diabetes mellitus without complications: Secondary | ICD-10-CM | POA: Diagnosis not present

## 2020-10-02 DIAGNOSIS — E785 Hyperlipidemia, unspecified: Secondary | ICD-10-CM

## 2020-10-02 DIAGNOSIS — F4321 Adjustment disorder with depressed mood: Secondary | ICD-10-CM

## 2020-10-02 DIAGNOSIS — E1159 Type 2 diabetes mellitus with other circulatory complications: Secondary | ICD-10-CM

## 2020-10-02 DIAGNOSIS — K219 Gastro-esophageal reflux disease without esophagitis: Secondary | ICD-10-CM

## 2020-10-02 DIAGNOSIS — I152 Hypertension secondary to endocrine disorders: Secondary | ICD-10-CM

## 2020-10-02 DIAGNOSIS — F419 Anxiety disorder, unspecified: Secondary | ICD-10-CM

## 2020-10-02 LAB — POCT GLYCOSYLATED HEMOGLOBIN (HGB A1C): Hemoglobin A1C: 5.5 % (ref 4.0–5.6)

## 2020-10-02 MED ORDER — EZETIMIBE-SIMVASTATIN 10-20 MG PO TABS: 1.0000 | ORAL_TABLET | Freq: Every day | ORAL | 1 refills | Status: DC

## 2020-10-02 MED ORDER — LORAZEPAM 1 MG PO TABS: ORAL_TABLET | ORAL | 0 refills | Status: DC

## 2020-10-02 NOTE — Progress Notes (Signed)
Established Patient Office Visit  Subjective:  Patient ID: Andrea Brooks, female    DOB: November 30, 1935  Age: 84 y.o. MRN: [id number]  CC:  Chief Complaint  Patient presents with  . Diabetes  . Hypertension  . Hyperlipidemia    HPI Andrea Brooks presents for follow up on diabetes mellitus, hypertension and hyperlipidemia. Has no acute concerns today. States she lost two of her siblings within 6 month period which has been very challenging but is doing better. Her sister passed away 09-08-20 and had to take Lorazepam to help calm her down. Has a good support system.  Diabetes: Pt denies increased urination or thirst. No hypoglycemic events. Checking glucose at home. FBS vary from 89-120.  HTN: Pt denies new onset chest pain, palpitations, dizziness, dyspnea, or lower extremity swelling. Taking medication as directed without side effects. Cardiology recently increased Carvedilol to 6.25 mg BID. Checks BP at home and readings range in 140s/60-70. Patient forgot to bring BP device for calibration. Pt follows a low salt diet.  HLD: Pt taking medication as directed without issues. Denies side effects.  GERD: Patient reports omeprazole is working good for heartburn.   Past Medical History:  Diagnosis Date  . Anxiety   . Colon polyps   . Diabetes mellitus without complication ([place])   . Diverticulosis   . Food poisoning   . Gallstones   . GERD (gastroesophageal reflux disease)   . Hyperlipidemia   . Hypertension   . Obesity   . 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 ABDOMINAL HYSTERECTOMY     total  . BREAST BIOPSY Left    neg  . CHOLECYSTECTOMY    . JOINT REPLACEMENT Bilateral    knee  . LEFT HEART CATH AND CORONARY ANGIOGRAPHY N/A 07/04/2018   Procedure: LEFT HEART CATH AND CORONARY ANGIOGRAPHY;  Surgeon: Jettie Booze, MD;  Location: [facility];   Service: Cardiovascular;  Laterality: N/A;  . REPLACEMENT TOTAL KNEE BILATERAL      Family History  Problem Relation Age of Onset  . Hypertension Mother   . Colon cancer Mother   . Stroke Father   . Heart failure Father   . Stroke Sister   . Stroke Brother   . Graves' disease Daughter   . Breast cancer Paternal Aunt   . Graves' disease Sister   . Bone cancer Sister   . Breast cancer Maternal Aunt   . Esophageal cancer Neg Hx   . Rectal cancer Neg Hx   . Liver cancer Neg Hx     Social History   Socioeconomic History  . Marital status: Married    Spouse name: Not on file  . Number of children: 1  . Years of education: Not on file  . Highest education level: Not on file  Occupational History  . Occupation: retired  Tobacco Use  . Smoking status: Former Smoker    Packs/day: 1.00    Years: 2.00    Pack years: 2.00    Quit date: 11/24/1959    Years since quitting: 60.9  . Smokeless tobacco: Never Used  Vaping Use  . Vaping Use: Never used  Substance and Sexual Activity  . Alcohol use: No  . Drug use: No  . Sexual activity: Not Currently  Other Topics Concern  . Not on file  Social History Narrative  . Not on file  Social Determinants of Health   Financial Resource Strain:   . Difficulty of Paying Living Expenses: Not on file  Food Insecurity:   . Worried About Charity fundraiser in the Last Year: Not on file  . Ran Out of Food in the Last Year: Not on file  Transportation Needs:   . Lack of Transportation (Medical): Not on file  . Lack of Transportation (Non-Medical): Not on file  Physical Activity:   . Days of Exercise per Week: Not on file  . Minutes of Exercise per Session: Not on file  Stress:   . Feeling of Stress : Not on file  Social Connections:   . Frequency of Communication with Friends and Family: Not on file  . Frequency of Social Gatherings with Friends and Family: Not on file  . Attends Religious Services: Not on file  . Active Member of  Clubs or Organizations: Not on file  . Attends Archivist Meetings: Not on file  . Marital Status: Not on file  Intimate Partner Violence:   . Fear of Current or Ex-Partner: Not on file  . Emotionally Abused: Not on file  . Physically Abused: Not on file  . Sexually Abused: Not on file    Outpatient Medications Prior to Visit  Medication Sig Dispense Refill  . acetaminophen (TYLENOL) 500 MG tablet Take 1,000 mg by mouth every 6 (six) hours as needed for moderate pain.    Marland Kitchen alum & mag hydroxide-simeth (MYLANTA) [phone] MG/5ML suspension Take 30 mLs by mouth every 6 (six) hours as needed for indigestion or heartburn.    Marland Kitchen aspirin EC 81 MG tablet Take 81 mg by mouth daily.    . calcium carbonate (TUMS - DOSED IN MG ELEMENTAL CALCIUM) 500 MG chewable tablet Chew 2 tablets by mouth as needed for indigestion or heartburn.    . carvedilol (COREG) 6.25 MG tablet Take 1 tablet (6.25 mg total) by mouth 2 (two) times daily with a meal. 180 tablet 3  . dicyclomine (BENTYL) 10 MG capsule Take 1 capsule (10 mg total) by mouth 3 (three) times daily as needed for spasms (IBS flareup). Future refills need to come from GI 30 capsule 1  . metoCLOPramide (REGLAN) 5 MG tablet Take 1 tablet (5 mg total) by mouth every 8 (eight) hours as needed for nausea. 12 tablet 0  . Multiple Vitamins-Minerals (MULTI FOR HER 50+) TABS Take 1 tablet by mouth daily.    Marland Kitchen omeprazole (PRILOSEC) 20 MG capsule Take 1 capsule by mouth daily, 30 minutes before breakfast. 90 capsule 3  . Probiotic Product (PROBIOTIC DAILY PO) Take by mouth.     . Probiotic Product (SOLUBLE FIBER/PROBIOTICS) CHEW Chew 2 tablets by mouth daily.    . sucralfate (CARAFATE) 1 g tablet Take 1 tablet (1 g total) by mouth 4 (four) times daily -  with meals and at bedtime for 14 days. 56 tablet 0  . valsartan (DIOVAN) 320 MG tablet Take 1 tablet (320 mg total) by mouth daily. 90 tablet 1  . ezetimibe-simvastatin (VYTORIN) 10-20 MG tablet TAKE 1  TABLET DAILY (Patient taking differently: Take 1 tablet by mouth daily. ) 90 tablet 0  . LORazepam (ATIVAN) 1 MG tablet 1/2 tablet daily as needed for anxiety (Patient taking differently: Take 0.5 mg by mouth daily as needed for anxiety. ) 30 tablet 0   No facility-administered medications prior to visit.    Allergies  Allergen Reactions  . Tape Rash and Other (See Comments)  PAPER TAPE ONLY!!!! NO CLEAR, PLASTIC TAPE- TEARS THE SKIN!!    ROS Review of Systems A fourteen system review of systems was performed and found to be positive as per HPI.   Objective:    Physical Exam General:  Well Developed, well nourished, appropriate for stated age.  Neuro:  Alert and oriented,  extra-ocular muscles intact  HEENT:  Normocephalic, atraumatic, neck supple Skin:  no gross rash, warm, pink. Cardiac:  RRR, S1 S2, no murmur Respiratory:  ECTA B/L and A/P, Not using accessory muscles, speaking in full sentences- unlabored. Vascular:  Ext warm, no cyanosis apprec.; cap RF less 2 sec. Psych:  No HI/SI, judgement and insight good, Euthymic mood. Full Affect.  BP 130/70   Pulse 63   Ht 5\' 4"  (1.626 m)   Wt 165 lb 3.2 oz (74.9 kg)   SpO2 97%   BMI 28.36 kg/m  Wt Readings from Last 3 Encounters:  10/02/20 165 lb 3.2 oz (74.9 kg)  09/05/20 163 lb (73.9 kg)  08/25/20 166 lb (75.3 kg)     Health Maintenance Due  Topic Date Due  . PNA vac Low Risk Adult (2 of 2 - PPSV23) 10/05/2018  . INFLUENZA VACCINE  06/23/2020    There are no preventive care reminders to display for this patient.  Lab Results  Component Value Date   TSH 1.620 11/07/2019   Lab Results  Component Value Date   WBC 10.5 08/25/2020   HGB 13.6 08/25/2020   HCT 42.3 08/25/2020   MCV 89.1 08/25/2020   PLT 245 08/25/2020   Lab Results  Component Value Date   NA 139 08/25/2020   K 3.7 08/25/2020   CO2 29 08/25/2020   GLUCOSE 192 (H) 08/25/2020   BUN 13 08/25/2020   CREATININE 0.90 08/25/2020   BILITOT 1.2  08/25/2020   ALKPHOS 66 08/25/2020   AST 30 08/25/2020   ALT 13 08/25/2020   PROT 7.4 08/25/2020   ALBUMIN 3.2 (L) 08/25/2020   CALCIUM 9.4 08/25/2020   ANIONGAP 12 08/25/2020   GFR 72.50 07/13/2017   Lab Results  Component Value Date   CHOL 102 11/07/2019   Lab Results  Component Value Date   HDL 28 (L) 11/07/2019   Lab Results  Component Value Date   LDLCALC 50 11/07/2019   Lab Results  Component Value Date   TRIG 133 11/07/2019   Lab Results  Component Value Date   CHOLHDL 3.6 11/07/2019   Lab Results  Component Value Date   HGBA1C 5.5 10/02/2020      Assessment & Plan:   Problem List Items Addressed This Visit      Cardiovascular and Mediastinum   Hypertension associated with diabetes ([place])   Relevant Medications   ezetimibe-simvastatin (VYTORIN) 10-20 MG tablet     Endocrine   Diabetes mellitus without complication ([facility]) - Primary (Chronic)   Relevant Orders   POCT glycosylated hemoglobin (Hb A1C) (Completed)     Other   Hyperlipidemia   Relevant Medications   ezetimibe-simvastatin (VYTORIN) 10-20 MG tablet   Anxiety   Relevant Medications   LORazepam (ATIVAN) 1 MG tablet    Other Visit Diagnoses    Grief         Hypertension associated with diabetes: -Initial BP elevated, BP recheck improved and borderline at goal.  Advised patient to use schedule nurse visit for BP recheck and to bring BP device. -Continue current medication regimen. -Continue ambulatory BP and pulse monitoring. -Follow low-sodium diet and stay well-hydrated. -  Will continue to monitor alongside cardiology.  Diabetes mellitus without complication: -S4H today 5.5, well controlled. -Continue low carbohydrate and glucose diet. -Continue ambulatory glucose monitoring. -Will continue to monitor.  Hyperlipidemia: -Last lipid panel: Total cholesterol 102, triglycerides 133, HDL 28, LDL 50 (at goal) -Continue current medication regimen.  Recent ALT normal. -Follow a heart  healthy diet low in saturated and transfats. -Stay as active as possible. -Advised to schedule lab visit for fasting blood work for December.  Anxiety, Grief: -PHQ-9 score of 4 -PDMP reviewed, no aberrancies noted.  Provided refill for lorazepam. -Continue to use good support system and recommend to consider grief counseling.  GERD: -Fairly controlled. -Continue current medication regimen (continue omeprazole for 2 months as advised by gastroenterology). -Recommend to avoid/limit provocative foods. -Will continue to monitor.   Meds ordered this encounter  Medications  . ezetimibe-simvastatin (VYTORIN) 10-20 MG tablet    Sig: Take 1 tablet by mouth daily.    Dispense:  90 tablet    Refill:  1    Order Specific Question:   Supervising Provider    Answer:   Beatrice Lecher D [[id number]]  . LORazepam (ATIVAN) 1 MG tablet    Sig: 1/2 tablet daily as needed for anxiety    Dispense:  30 tablet    Refill:  0    Order Specific Question:   Supervising Provider    Answer:   Beatrice Lecher D [[id number]]    Follow-up: Return in about 3 months (around 01/02/2021) for DM, HTN; lab visit in Dec for [place].   Note:  This note was prepared with assistance of Dragon voice recognition software. Occasional wrong-word or sound-a-like substitutions may have occurred due to the inherent limitations of voice recognition software.  Lorrene Reid, PA-C

## 2020-10-02 NOTE — Patient Instructions (Signed)
DASH Eating Plan DASH stands for "Dietary Approaches to Stop Hypertension." The DASH eating plan is a healthy eating plan that has been shown to reduce high blood pressure (hypertension). It may also reduce your risk for type 2 diabetes, heart disease, and stroke. The DASH eating plan may also help with weight loss. What are tips for following this plan?  General guidelines  Avoid eating more than 2,300 mg (milligrams) of salt (sodium) a day. If you have hypertension, you may need to reduce your sodium intake to 1,500 mg a day.  Limit alcohol intake to no more than 1 drink a day for nonpregnant women and 2 drinks a day for men. One drink equals 12 oz of beer, 5 oz of wine, or 1 oz of hard liquor.  Work with your health care provider to maintain a healthy body weight or to lose weight. Ask what an ideal weight is for you.  Get at least 30 minutes of exercise that causes your heart to beat faster (aerobic exercise) most days of the week. Activities may include walking, swimming, or biking.  Work with your health care provider or diet and nutrition specialist (dietitian) to adjust your eating plan to your individual calorie needs. Reading food labels   Check food labels for the amount of sodium per serving. Choose foods with less than 5 percent of the Daily Value of sodium. Generally, foods with less than 300 mg of sodium per serving fit into this eating plan.  To find whole grains, look for the word "whole" as the first word in the ingredient list. Shopping  Buy products labeled as "low-sodium" or "no salt added."  Buy fresh foods. Avoid canned foods and premade or frozen meals. Cooking  Avoid adding salt when cooking. Use salt-free seasonings or herbs instead of table salt or sea salt. Check with your health care provider or pharmacist before using salt substitutes.  Do not fry foods. Cook foods using healthy methods such as baking, boiling, grilling, and broiling instead.  Cook with  heart-healthy oils, such as olive, canola, soybean, or sunflower oil. Meal planning  Eat a balanced diet that includes: ? 5 or more servings of fruits and vegetables each day. At each meal, try to fill half of your plate with fruits and vegetables. ? Up to 6-8 servings of whole grains each day. ? Less than 6 oz of lean meat, poultry, or fish each day. A 3-oz serving of meat is about the same size as a deck of cards. One egg equals 1 oz. ? 2 servings of low-fat dairy each day. ? A serving of nuts, seeds, or beans 5 times each week. ? Heart-healthy fats. Healthy fats called Omega-3 fatty acids are found in foods such as flaxseeds and coldwater fish, like sardines, salmon, and mackerel.  Limit how much you eat of the following: ? Canned or prepackaged foods. ? Food that is high in trans fat, such as fried foods. ? Food that is high in saturated fat, such as fatty meat. ? Sweets, desserts, sugary drinks, and other foods with added sugar. ? Full-fat dairy products.  Do not salt foods before eating.  Try to eat at least 2 vegetarian meals each week.  Eat more home-cooked food and less restaurant, buffet, and fast food.  When eating at a restaurant, ask that your food be prepared with less salt or no salt, if possible. What foods are recommended? The items listed may not be a complete list. Talk with your dietitian about   what dietary choices are best for you. Grains Whole-grain or whole-wheat bread. Whole-grain or whole-wheat pasta. Brown rice. Oatmeal. Quinoa. Bulgur. Whole-grain and low-sodium cereals. Pita bread. Low-fat, low-sodium crackers. Whole-wheat flour tortillas. Vegetables Fresh or frozen vegetables (raw, steamed, roasted, or grilled). Low-sodium or reduced-sodium tomato and vegetable juice. Low-sodium or reduced-sodium tomato sauce and tomato paste. Low-sodium or reduced-sodium canned vegetables. Fruits All fresh, dried, or frozen fruit. Canned fruit in natural juice (without  added sugar). Meat and other protein foods Skinless chicken or turkey. Ground chicken or turkey. Pork with fat trimmed off. Fish and seafood. Egg whites. Dried beans, peas, or lentils. Unsalted nuts, nut butters, and seeds. Unsalted canned beans. Lean cuts of beef with fat trimmed off. Low-sodium, lean deli meat. Dairy Low-fat (1%) or fat-free (skim) milk. Fat-free, low-fat, or reduced-fat cheeses. Nonfat, low-sodium ricotta or cottage cheese. Low-fat or nonfat yogurt. Low-fat, low-sodium cheese. Fats and oils Soft margarine without trans fats. Vegetable oil. Low-fat, reduced-fat, or light mayonnaise and salad dressings (reduced-sodium). Canola, safflower, olive, soybean, and sunflower oils. Avocado. Seasoning and other foods Herbs. Spices. Seasoning mixes without salt. Unsalted popcorn and pretzels. Fat-free sweets. What foods are not recommended? The items listed may not be a complete list. Talk with your dietitian about what dietary choices are best for you. Grains Baked goods made with fat, such as croissants, muffins, or some breads. Dry pasta or rice meal packs. Vegetables Creamed or fried vegetables. Vegetables in a cheese sauce. Regular canned vegetables (not low-sodium or reduced-sodium). Regular canned tomato sauce and paste (not low-sodium or reduced-sodium). Regular tomato and vegetable juice (not low-sodium or reduced-sodium). Pickles. Olives. Fruits Canned fruit in a light or heavy syrup. Fried fruit. Fruit in cream or butter sauce. Meat and other protein foods Fatty cuts of meat. Ribs. Fried meat. Bacon. Sausage. Bologna and other processed lunch meats. Salami. Fatback. Hotdogs. Bratwurst. Salted nuts and seeds. Canned beans with added salt. Canned or smoked fish. Whole eggs or egg yolks. Chicken or turkey with skin. Dairy Whole or 2% milk, cream, and half-and-half. Whole or full-fat cream cheese. Whole-fat or sweetened yogurt. Full-fat cheese. Nondairy creamers. Whipped toppings.  Processed cheese and cheese spreads. Fats and oils Butter. Stick margarine. Lard. Shortening. Ghee. Bacon fat. Tropical oils, such as coconut, palm kernel, or palm oil. Seasoning and other foods Salted popcorn and pretzels. Onion salt, garlic salt, seasoned salt, table salt, and sea salt. Worcestershire sauce. Tartar sauce. Barbecue sauce. Teriyaki sauce. Soy sauce, including reduced-sodium. Steak sauce. Canned and packaged gravies. Fish sauce. Oyster sauce. Cocktail sauce. Horseradish that you find on the shelf. Ketchup. Mustard. Meat flavorings and tenderizers. Bouillon cubes. Hot sauce and Tabasco sauce. Premade or packaged marinades. Premade or packaged taco seasonings. Relishes. Regular salad dressings. Where to find more information:  National Heart, Lung, and Blood Institute: www.nhlbi.nih.gov  American Heart Association: www.heart.org Summary  The DASH eating plan is a healthy eating plan that has been shown to reduce high blood pressure (hypertension). It may also reduce your risk for type 2 diabetes, heart disease, and stroke.  With the DASH eating plan, you should limit salt (sodium) intake to 2,300 mg a day. If you have hypertension, you may need to reduce your sodium intake to 1,500 mg a day.  When on the DASH eating plan, aim to eat more fresh fruits and vegetables, whole grains, lean proteins, low-fat dairy, and heart-healthy fats.  Work with your health care provider or diet and nutrition specialist (dietitian) to adjust your eating plan to your   individual calorie needs. This information is not intended to replace advice given to you by your health care provider. Make sure you discuss any questions you have with your health care provider. Document Revised: 10/22/2017 Document Reviewed: 11/02/2016 Elsevier Patient Education  2020 Else[employer] Inc[employer]  

## 2020-10-23 ENCOUNTER — Other Ambulatory Visit: Payer: Self-pay | Admitting: Physician Assistant

## 2020-10-23 DIAGNOSIS — K219 Gastro-esophageal reflux disease without esophagitis: Secondary | ICD-10-CM

## 2020-11-06 ENCOUNTER — Other Ambulatory Visit: Payer: Self-pay

## 2020-11-06 DIAGNOSIS — Z0000 Encounter for general adult medical examination without abnormal findings: Secondary | ICD-10-CM

## 2020-11-06 DIAGNOSIS — E119 Type 2 diabetes mellitus without complications: Secondary | ICD-10-CM

## 2020-11-06 DIAGNOSIS — I152 Hypertension secondary to endocrine disorders: Secondary | ICD-10-CM

## 2020-11-06 DIAGNOSIS — E785 Hyperlipidemia, unspecified: Secondary | ICD-10-CM

## 2020-11-11 ENCOUNTER — Other Ambulatory Visit: Payer: Self-pay

## 2020-11-11 ENCOUNTER — Other Ambulatory Visit (INDEPENDENT_AMBULATORY_CARE_PROVIDER_SITE_OTHER): Payer: Medicare HMO

## 2020-11-11 DIAGNOSIS — Z23 Encounter for immunization: Secondary | ICD-10-CM | POA: Diagnosis not present

## 2020-11-11 DIAGNOSIS — Z0000 Encounter for general adult medical examination without abnormal findings: Secondary | ICD-10-CM

## 2020-11-11 DIAGNOSIS — E1159 Type 2 diabetes mellitus with other circulatory complications: Secondary | ICD-10-CM

## 2020-11-11 DIAGNOSIS — E785 Hyperlipidemia, unspecified: Secondary | ICD-10-CM

## 2020-11-11 DIAGNOSIS — E119 Type 2 diabetes mellitus without complications: Secondary | ICD-10-CM

## 2020-11-11 NOTE — Progress Notes (Signed)
Pt here for influenza vaccine.  Screening questionnaire reviewed, VIS provided to patient, and any/all patient questions answered.    Pt also requested that we check her BP machine against ours for accuracy.  BP with our machine was 171/74 and with her machine it was 175/79.  Advised pt that these numbers are very consistent with each.  Advised pt to check BP daily since systolic reading is elevated and to call if consistently above 655 systolical.  Pt expressed understanding and is agreeableT. Meda Coffee, CMA

## 2020-11-12 LAB — CBC WITH DIFFERENTIAL/PLATELET
Basophils Absolute: 0 10*3/uL (ref 0.0–0.2)
Basos: 1 %
EOS (ABSOLUTE): 0.2 10*3/uL (ref 0.0–0.4)
Eos: 3 %
Hematocrit: 38.8 % (ref 34.0–46.6)
Hemoglobin: 12.8 g/dL (ref 11.1–15.9)
Immature Grans (Abs): 0 10*3/uL (ref 0.0–0.1)
Immature Granulocytes: 0 %
Lymphocytes Absolute: 1.3 10*3/uL (ref 0.7–3.1)
Lymphs: 22 %
MCH: 28.7 pg (ref 26.6–33.0)
MCHC: 33 g/dL (ref 31.5–35.7)
MCV: 87 fL (ref 79–97)
Monocytes Absolute: 0.5 10*3/uL (ref 0.1–0.9)
Monocytes: 9 %
Neutrophils Absolute: 4.1 10*3/uL (ref 1.4–7.0)
Neutrophils: 65 %
Platelets: 224 10*3/uL (ref 150–450)
RBC: 4.46 x10E6/uL (ref 3.77–5.28)
RDW: 13.2 % (ref 11.7–15.4)
WBC: 6.2 10*3/uL (ref 3.4–10.8)

## 2020-11-12 LAB — COMPREHENSIVE METABOLIC PANEL
ALT: 12 IU/L (ref 0–32)
AST: 19 IU/L (ref 0–40)
Albumin/Globulin Ratio: 1.1 — ABNORMAL LOW (ref 1.2–2.2)
Albumin: 3.8 g/dL (ref 3.6–4.6)
Alkaline Phosphatase: 82 IU/L (ref 44–121)
BUN/Creatinine Ratio: 11 — ABNORMAL LOW (ref 12–28)
BUN: 9 mg/dL (ref 8–27)
Bilirubin Total: 0.5 mg/dL (ref 0.0–1.2)
CO2: 26 mmol/L (ref 20–29)
Calcium: 9.2 mg/dL (ref 8.7–10.3)
Chloride: 105 mmol/L (ref 96–106)
Creatinine, Ser: 0.85 mg/dL (ref 0.57–1.00)
GFR calc Af Amer: 74 mL/min/{1.73_m2} (ref >59)
GFR calc non Af Amer: 64 mL/min/{1.73_m2} (ref >59)
Globulin, Total: 3.4 g/dL (ref 1.5–4.5)
Glucose: 109 mg/dL — ABNORMAL HIGH (ref 65–99)
Potassium: 4.3 mmol/L (ref 3.5–5.2)
Sodium: 143 mmol/L (ref 134–144)
Total Protein: 7.2 g/dL (ref 6.0–8.5)

## 2020-11-12 LAB — LIPID PANEL
Chol/HDL Ratio: 3.4 ratio (ref 0.0–4.4)
Cholesterol, Total: 88 mg/dL — ABNORMAL LOW (ref 100–199)
HDL: 26 mg/dL — ABNORMAL LOW (ref >39)
LDL Chol Calc (NIH): 42 mg/dL (ref 0–99)
Triglycerides: 107 mg/dL (ref 0–149)
VLDL Cholesterol Cal: 20 mg/dL (ref 5–40)

## 2020-11-12 LAB — HEMOGLOBIN A1C
Est. average glucose Bld gHb Est-mCnc: 120 mg/dL
Hgb A1c MFr Bld: 5.8 % — ABNORMAL HIGH (ref 4.8–5.6)

## 2020-11-12 LAB — TSH: TSH: 2.31 u[IU]/mL (ref 0.450–4.500)

## 2020-11-15 ENCOUNTER — Other Ambulatory Visit: Payer: Self-pay | Admitting: Physician Assistant

## 2020-11-15 DIAGNOSIS — I152 Hypertension secondary to endocrine disorders: Secondary | ICD-10-CM

## 2021-01-07 ENCOUNTER — Ambulatory Visit (INDEPENDENT_AMBULATORY_CARE_PROVIDER_SITE_OTHER): Payer: Medicare HMO | Admitting: Physician Assistant

## 2021-01-07 ENCOUNTER — Encounter: Payer: Self-pay | Admitting: Physician Assistant

## 2021-01-07 ENCOUNTER — Other Ambulatory Visit: Payer: Self-pay

## 2021-01-07 VITALS — BP 157/71 | HR 70 | Temp 97.4°F | Ht 64.0 in | Wt 158.7 lb

## 2021-01-07 DIAGNOSIS — K582 Mixed irritable bowel syndrome: Secondary | ICD-10-CM

## 2021-01-07 DIAGNOSIS — I152 Hypertension secondary to endocrine disorders: Secondary | ICD-10-CM

## 2021-01-07 DIAGNOSIS — E785 Hyperlipidemia, unspecified: Secondary | ICD-10-CM

## 2021-01-07 DIAGNOSIS — E119 Type 2 diabetes mellitus without complications: Secondary | ICD-10-CM

## 2021-01-07 DIAGNOSIS — J3089 Other allergic rhinitis: Secondary | ICD-10-CM | POA: Diagnosis not present

## 2021-01-07 DIAGNOSIS — E1159 Type 2 diabetes mellitus with other circulatory complications: Secondary | ICD-10-CM | POA: Diagnosis not present

## 2021-01-07 LAB — POCT UA - MICROALBUMIN
Albumin/Creatinine Ratio, Urine, POC: <30
Creatinine, POC: 200 mg/dL
Microalbumin Ur, POC: 30 mg/L

## 2021-01-07 LAB — POCT GLYCOSYLATED HEMOGLOBIN (HGB A1C): Hemoglobin A1C: 5.6 % (ref 4.0–5.6)

## 2021-01-07 MED ORDER — CARVEDILOL 6.25 MG PO TABS: 12.5000 mg | ORAL_TABLET | Freq: Two times a day (BID) | ORAL | 3 refills | Status: DC

## 2021-01-07 NOTE — Assessment & Plan Note (Signed)
-  BP elevated today and ambulatory BP readings have been elevated so will make medication adjustments.  Patient is on max dose of valsartan so will increase carvedilol to 12.5 mg twice daily.  Patient recently filled carvedilol 6.25 prescription so advised to take 2 tablets twice daily.  Monitor BP and pulse for 2 weeks and keep a log to forward to the office. -Continue low-sodium diet and stay well-hydrated. -Last CMP renal function and electrolytes WNL. -Will continue to monitor alongside cardiology.

## 2021-01-07 NOTE — Patient Instructions (Addendum)
Increase Carvedilol to 2 tablets (12.5 mg) twice daily.  Heart-Healthy Eating Plan Heart-healthy meal planning includes:  Eating less unhealthy fats.  Eating more healthy fats.  Making other changes in your diet. Talk with your doctor or a diet specialist (dietitian) to create an eating plan that is right for you. What is my plan? Your doctor may recommend an eating plan that includes:  Total fat: ______% or less of total calories a day.  Saturated fat: ______% or less of total calories a day.  Cholesterol: less than _________mg a day. What are tips for following this plan? Cooking Avoid frying your food. Try to bake, boil, grill, or broil it instead. You can also reduce fat by:  Removing the skin from poultry.  Removing all visible fats from meats.  Steaming vegetables in water or broth. Meal planning  At meals, divide your plate into four equal parts: ? Fill one-half of your plate with vegetables and green salads. ? Fill one-fourth of your plate with whole grains. ? Fill one-fourth of your plate with lean protein foods.  Eat 4-5 servings of vegetables per day. A serving of vegetables is: ? 1 cup of raw or cooked vegetables. ? 2 cups of raw leafy greens.  Eat 4-5 servings of fruit per day. A serving of fruit is: ? 1 medium whole fruit. ?  cup of dried fruit. ?  cup of fresh, frozen, or canned fruit. ?  cup of 100% fruit juice.  Eat more foods that have soluble fiber. These are apples, broccoli, carrots, beans, peas, and barley. Try to get 20-30 g of fiber per day.  Eat 4-5 servings of nuts, legumes, and seeds per week: ? 1 serving of dried beans or legumes equals  cup after being cooked. ? 1 serving of nuts is  cup. ? 1 serving of seeds equals 1 tablespoon.   General information  Eat more home-cooked food. Eat less restaurant, buffet, and fast food.  Limit or avoid alcohol.  Limit foods that are high in starch and sugar.  Avoid fried foods.  Lose  weight if you are overweight.  Keep track of how much salt (sodium) you eat. This is important if you have high blood pressure. Ask your doctor to tell you more about this.  Try to add vegetarian meals each week. Fats  Choose healthy fats. These include olive oil and canola oil, flaxseeds, walnuts, almonds, and seeds.  Eat more omega-3 fats. These include salmon, mackerel, sardines, tuna, flaxseed oil, and ground flaxseeds. Try to eat fish at least 2 times each week.  Check food labels. Avoid foods with trans fats or high amounts of saturated fat.  Limit saturated fats. ? These are often found in animal products, such as meats, butter, and cream. ? These are also found in plant foods, such as palm oil, palm kernel oil, and coconut oil.  Avoid foods with partially hydrogenated oils in them. These have trans fats. Examples are stick margarine, some tub margarines, cookies, crackers, and other baked goods. What foods can I eat? Fruits All fresh, canned (in natural juice), or frozen fruits. Vegetables Fresh or frozen vegetables (raw, steamed, roasted, or grilled). Green salads. Grains Most grains. Choose whole wheat and whole grains most of the time. Rice and pasta, including brown rice and pastas made with whole wheat. Meats and other proteins Lean, well-trimmed beef, veal, pork, and lamb. Chicken and [place] without skin. All fish and shellfish. Wild duck, rabbit, pheasant, and venison. Egg whites or low-cholesterol  egg substitutes. Dried beans, peas, lentils, and tofu. Seeds and most nuts. Dairy Low-fat or nonfat cheeses, including ricotta and mozzarella. Skim or 1% milk that is liquid, powdered, or evaporated. Buttermilk that is made with low-fat milk. Nonfat or low-fat yogurt. Fats and oils Non-hydrogenated (trans-free) margarines. Vegetable oils, including soybean, sesame, sunflower, olive, peanut, safflower, corn, canola, and cottonseed. Salad dressings or mayonnaise made with a  vegetable oil. Beverages Mineral water. Coffee and tea. Diet carbonated beverages. Sweets and desserts Sherbet, gelatin, and fruit ice. Small amounts of dark chocolate. Limit all sweets and desserts. Seasonings and condiments All seasonings and condiments. The items listed above may not be a complete list of foods and drinks you can eat. Contact a dietitian for more options. What foods should I avoid? Fruits Canned fruit in heavy syrup. Fruit in cream or butter sauce. Fried fruit. Limit coconut. Vegetables Vegetables cooked in cheese, cream, or butter sauce. Fried vegetables. Grains Breads that are made with saturated or trans fats, oils, or whole milk. Croissants. Sweet rolls. Donuts. High-fat crackers, such as cheese crackers. Meats and other proteins Fatty meats, such as hot dogs, ribs, sausage, bacon, rib-eye roast or steak. High-fat deli meats, such as salami and bologna. Caviar. Domestic duck and goose. Organ meats, such as liver. Dairy Cream, sour cream, cream cheese, and creamed cottage cheese. Whole-milk cheeses. Whole or 2% milk that is liquid, evaporated, or condensed. Whole buttermilk. Cream sauce or high-fat cheese sauce. Yogurt that is made from whole milk. Fats and oils Meat fat, or shortening. Cocoa butter, hydrogenated oils, palm oil, coconut oil, palm kernel oil. Solid fats and shortenings, including bacon fat, salt pork, lard, and butter. Nondairy cream substitutes. Salad dressings with cheese or sour cream. Beverages Regular sodas and juice drinks with added sugar. Sweets and desserts Frosting. Pudding. Cookies. Cakes. Pies. Milk chocolate or white chocolate. Buttered syrups. Full-fat ice cream or ice cream drinks. The items listed above may not be a complete list of foods and drinks to avoid. Contact a dietitian for more information. Summary  Heart-healthy meal planning includes eating less unhealthy fats, eating more healthy fats, and making other changes in your  diet.  Eat a balanced diet. This includes fruits and vegetables, low-fat or nonfat dairy, lean protein, nuts and legumes, whole grains, and heart-healthy oils and fats. This information is not intended to replace advice given to you by your health care provider. Make sure you discuss any questions you have with your health care provider. Document Revised: 01/13/2018 Document Reviewed: 12/17/2017 Elsevier Patient Education  2021 [employer].   Diet for Irritable Bowel Syndrome When you have irritable bowel syndrome (IBS), it is very important to eat the foods and follow the eating habits that are best for your condition. IBS may cause various symptoms such as pain in the abdomen, constipation, or diarrhea. Choosing the right foods can help to ease the discomfort from these symptoms. Work with your health care provider and diet and nutrition specialist (dietitian) to find the eating plan that will help to control your symptoms. What are tips for following this plan?  Keep a food diary. This will help you identify foods that cause symptoms. Write down: ? What you eat and when you eat it. ? What symptoms you have. ? When symptoms occur in relation to your meals, such as "pain in abdomen 2 hours after dinner."  Eat your meals slowly and in a relaxed setting.  Aim to eat 5-6 small meals per day. Do not skip  meals.  Drink enough fluid to keep your urine pale yellow.  Ask your health care provider if you should take an over-the-counter probiotic to help restore healthy bacteria in your gut (digestive tract). ? Probiotics are foods that contain good bacteria and yeasts.  Your dietitian may have specific dietary recommendations for you based on your symptoms. He or she may recommend that you: ? Avoid foods that cause symptoms. Talk with your dietitian about other ways to get the same nutrients that are in those problem foods. ? Avoid foods with gluten. Gluten is a protein that is found in rye,  wheat, and barley. ? Eat more foods that contain soluble fiber. Examples of foods with high soluble fiber include oats, seeds, and certain fruits and vegetables. Take a fiber supplement if directed by your dietitian. ? Reduce or avoid certain foods called FODMAPs. These are foods that contain carbohydrates that are hard to digest. Ask your doctor which foods contain these carbohydrates.      What foods are not recommended? The following are some foods and drinks that may make your symptoms worse:  Fatty foods, such as french fries.  Foods that contain gluten, such as pasta and cereal.  Dairy products, such as milk, cheese, and ice cream.  Chocolate.  Alcohol.  Products with caffeine, such as coffee.  Carbonated drinks, such as soda.  Foods that are high in FODMAPs. These include certain fruits and vegetables.  Products with sweeteners such as honey, high fructose corn syrup, sorbitol, and mannitol. The items listed above may not be a complete list of foods and beverages you should avoid. Contact a dietitian for more information.   What foods are good sources of fiber? Your health care provider or dietitian may recommend that you eat more foods that contain fiber. Fiber can help to reduce constipation and other IBS symptoms. Add foods with fiber to your diet a little at a time so your body can get used to them. Too much fiber at one time might cause gas and swelling of your abdomen. The following are some foods that are good sources of fiber:  Berries, such as raspberries, strawberries, and blueberries.  Tomatoes.  Carrots.  Brown rice.  Oats.  Seeds, such as chia and pumpkin seeds. The items listed above may not be a complete list of recommended sources of fiber. Contact your dietitian for more options. Where to find more information  International Foundation for Functional Gastrointestinal Disorders: www.iffgd.[employer] of Diabetes and Digestive and Kidney  Diseases: [employer] Summary  When you have irritable bowel syndrome (IBS), it is very important to eat the foods and follow the eating habits that are best for your condition.  IBS may cause various symptoms such as pain in the abdomen, constipation, or diarrhea.  Choosing the right foods can help to ease the discomfort that comes from symptoms.  Keep a food diary. This will help you identify foods that cause symptoms.  Your health care provider or diet and nutrition specialist (dietitian) may recommend that you eat more foods that contain fiber. This information is not intended to replace advice given to you by your health care provider. Make sure you discuss any questions you have with your health care provider. Document Revised: 07/11/2020 Document Reviewed: 07/11/2020 Elsevier Patient Education  2021 [employer].

## 2021-01-07 NOTE — Assessment & Plan Note (Signed)
-  Last lipid panel: Total cholesterol 88, triglycerides 107, HDL 26, LDL 42 -Continue current medication regimen.  Will repeat lipid panel with [facility] and if total cholesterol continues to be low recommend decreasing Vytorin. -Follow a diet low in saturated and transfats.

## 2021-01-07 NOTE — Progress Notes (Signed)
Established Patient Office Visit  Subjective:  Patient ID: Andrea Brooks, female    DOB: 16-Dec-1935  Age: 85 y.o. MRN: [id number]  CC:  Chief Complaint  Patient presents with  . Hypertension  . Diabetes    HPI [name] presents for follow up on hypertension and diabetes mellitus. Reports year round has runny nose. Uses a nasal spray when needed.  HTN: Pt denies chest pain, palpitations, dizziness or edema. Taking medication as directed without side effects. Checks BP at home  and recent readings range 578-469/62-95 with one diastolic reading 284. States her husband had double hernia surgery 3 weeks ago and since then has been under increased stress with taking care of him. Pt follows a low salt diet. Tries to stay hydrated.  Diabetes mellitus: Pt denies increased urination or thirst. Pt reports medication compliance. No hypoglycemic events. Checking glucose at home. FBS range 120-125. Tries to limit sweets.  IBS: Reports during flare-ups has decreased appetite. Followed by gastroenterology.    Past Medical History:  Diagnosis Date  . Anxiety   . Colon polyps   . Diabetes mellitus without complication ([place])   . Diverticulosis   . Food poisoning   . Gallstones   . GERD (gastroesophageal reflux disease)   . Hyperlipidemia   . Hypertension   . Obesity   . 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 ABDOMINAL HYSTERECTOMY     total  . BREAST BIOPSY Left    neg  . CHOLECYSTECTOMY    . JOINT REPLACEMENT Bilateral    knee  . LEFT HEART CATH AND CORONARY ANGIOGRAPHY N/A 07/04/2018   Procedure: LEFT HEART CATH AND CORONARY ANGIOGRAPHY;  Surgeon: Jettie Booze, MD;  Location: [facility];  Service: Cardiovascular;  Laterality: N/A;  . REPLACEMENT TOTAL KNEE BILATERAL      Family History  Problem Relation Age of Onset  . Hypertension Mother   .  Colon cancer Mother   . Stroke Father   . Heart failure Father   . Stroke Sister   . Stroke Brother   . Graves' disease Daughter   . Breast cancer Paternal Aunt   . Graves' disease Sister   . Bone cancer Sister   . Breast cancer Maternal Aunt   . Esophageal cancer Neg Hx   . Rectal cancer Neg Hx   . Liver cancer Neg Hx     Social History   Socioeconomic History  . Marital status: Married    Spouse name: Not on file  . Number of children: 1  . Years of education: Not on file  . Highest education level: Not on file  Occupational History  . Occupation: retired  Tobacco Use  . Smoking status: Former Smoker    Packs/day: 1.00    Years: 2.00    Pack years: 2.00    Quit date: 11/24/1959    Years since quitting: 61.1  . Smokeless tobacco: Never Used  Vaping Use  . Vaping Use: Never used  Substance and Sexual Activity  . Alcohol use: No  . Drug use: No  . Sexual activity: Not Currently  Other Topics Concern  . Not on file  Social History Narrative  . Not on file   Social Determinants of Health   Financial Resource Strain: Not on file  Food Insecurity: Not on file  Transportation Needs: Not on file  Physical Activity: Not on file  Stress: Not on file  Social Connections: Not on file  Intimate Partner Violence: Not on file    Outpatient Medications Prior to Visit  Medication Sig Dispense Refill  . acetaminophen (TYLENOL) 500 MG tablet Take 1,000 mg by mouth every 6 (six) hours as needed for moderate pain.    Marland Kitchen alum & mag hydroxide-simeth (MAALOX/MYLANTA) 200-200-20 MG/5ML suspension Take 30 mLs by mouth every 6 (six) hours as needed for indigestion or heartburn.    Marland Kitchen aspirin EC 81 MG tablet Take 81 mg by mouth daily.    . calcium carbonate (TUMS - DOSED IN MG ELEMENTAL CALCIUM) 500 MG chewable tablet Chew 2 tablets by mouth as needed for indigestion or heartburn.    . dicyclomine (BENTYL) 10 MG capsule Take 1 capsule (10 mg total) by mouth 3 (three) times daily as  needed for spasms (IBS flareup). Future refills need to come from GI 30 capsule 1  . ezetimibe-simvastatin (VYTORIN) 10-20 MG tablet Take 1 tablet by mouth daily. 90 tablet 1  . LORazepam (ATIVAN) 1 MG tablet 1/2 tablet daily as needed for anxiety 30 tablet 0  . metoCLOPramide (REGLAN) 5 MG tablet Take 1 tablet (5 mg total) by mouth every 8 (eight) hours as needed for nausea. 12 tablet 0  . Multiple Vitamins-Minerals (MULTI FOR HER 50+) TABS Take 1 tablet by mouth daily.    Marland Kitchen omeprazole (PRILOSEC) 20 MG capsule Take 1 capsule by mouth daily, 30 minutes before breakfast. 90 capsule 3  . Probiotic Product (PROBIOTIC DAILY PO) Take by mouth.     . Probiotic Product (SOLUBLE FIBER/PROBIOTICS) CHEW Chew 2 tablets by mouth daily.    . valsartan (DIOVAN) 320 MG tablet Take 1 tablet (320 mg total) by mouth daily. 90 tablet 0  . carvedilol (COREG) 6.25 MG tablet Take 1 tablet (6.25 mg total) by mouth 2 (two) times daily with a meal. 180 tablet 3  . sucralfate (CARAFATE) 1 g tablet Take 1 tablet (1 g total) by mouth 4 (four) times daily -  with meals and at bedtime for 14 days. 56 tablet 0   No facility-administered medications prior to visit.    Allergies  Allergen Reactions  . Tape Rash and Other (See Comments)    PAPER TAPE ONLY!!!! NO CLEAR, PLASTIC TAPE- TEARS THE SKIN!!    ROS Review of Systems A fourteen system review of systems was performed and found to be positive as per HPI.   Objective:    Physical Exam General:  Well Developed, well nourished, in no acute distress Neuro:  Alert and oriented,  extra-ocular muscles intact  HEENT:  Normocephalic, atraumatic, neck supple Skin:  no gross rash, warm, pink. Cardiac:  RRR, S1 S2 Respiratory:  Not using accessory muscles, speaking in full sentences- unlabored. Vascular:  Ext warm, no cyanosis apprec.; no gross edema Psych:  No HI/SI, judgement and insight good, Euthymic mood. Full Affect.  BP (!) 157/71   Pulse 70   Temp (!) 97.4 F  (36.3 C)   Ht 5\' 4"  (1.626 m)   Wt 158 lb 11.2 oz (72 kg)   SpO2 97%   BMI 27.24 kg/m  Wt Readings from Last 3 Encounters:  01/07/21 158 lb 11.2 oz (72 kg)  10/02/20 165 lb 3.2 oz (74.9 kg)  09/05/20 163 lb (73.9 kg)     Health Maintenance Due  Topic Date Due  . PNA vac Low Risk Adult (2 of 2 - PPSV23) 10/05/2018  .  COVID-19 Vaccine (3 - Booster for Pfizer series) 08/14/2020  . FOOT EXAM  11/06/2020    There are no preventive care reminders to display for this patient.  Lab Results  Component Value Date   TSH 2.310 11/11/2020   Lab Results  Component Value Date   WBC 6.2 11/11/2020   HGB 12.8 11/11/2020   HCT 38.8 11/11/2020   MCV 87 11/11/2020   PLT 224 11/11/2020   Lab Results  Component Value Date   NA 143 11/11/2020   K 4.3 11/11/2020   CO2 26 11/11/2020   GLUCOSE 109 (H) 11/11/2020   BUN 9 11/11/2020   CREATININE 0.85 11/11/2020   BILITOT 0.5 11/11/2020   ALKPHOS 82 11/11/2020   AST 19 11/11/2020   ALT 12 11/11/2020   PROT 7.2 11/11/2020   ALBUMIN 3.8 11/11/2020   CALCIUM 9.2 11/11/2020   ANIONGAP 12 08/25/2020   GFR 72.50 07/13/2017   Lab Results  Component Value Date   CHOL 88 (L) 11/11/2020   Lab Results  Component Value Date   HDL 26 (L) 11/11/2020   Lab Results  Component Value Date   LDLCALC 42 11/11/2020   Lab Results  Component Value Date   TRIG 107 11/11/2020   Lab Results  Component Value Date   CHOLHDL 3.4 11/11/2020   Lab Results  Component Value Date   HGBA1C 5.6 01/07/2021      Assessment & Plan:   Problem List Items Addressed This Visit      Cardiovascular and Mediastinum   Hypertension associated with diabetes ([place])    -BP elevated today and ambulatory BP readings have been elevated so will make medication adjustments.  Patient is on max dose of valsartan so will increase carvedilol to 12.5 mg twice daily.  Patient recently filled carvedilol 6.25 prescription so advised to take 2 tablets twice daily.  Monitor BP  and pulse for 2 weeks and keep a log to forward to the office. -Continue low-sodium diet and stay well-hydrated. -Last CMP renal function and electrolytes WNL. -Will continue to monitor alongside cardiology.      Relevant Medications   carvedilol (COREG) 6.25 MG tablet     Digestive   Irritable bowel syndrome with both constipation and diarrhea     Endocrine   Diabetes mellitus without complication (HCC) - Primary (Chronic)    -A1c today 5.6, improved from 5.9 and at goal. -Continue to manage with diet, monitor carbohydrates and glucose. -Continue ambulatory glucose monitoring. -UA microalbumin collected today, normal. -Will continue to monitor.      Relevant Orders   POCT glycosylated hemoglobin (Hb A1C) (Completed)   POCT UA - Microalbumin (Completed)     Other   Hyperlipidemia    -Last lipid panel: Total cholesterol 88, triglycerides 107, HDL 26, LDL 42 -Continue current medication regimen.  Will repeat lipid panel with [facility] and if total cholesterol continues to be low recommend decreasing Vytorin. -Follow a diet low in saturated and transfats.      Relevant Medications   carvedilol (COREG) 6.25 MG tablet    Other Visit Diagnoses    Environmental and seasonal allergies         Environmental seasonal allergies: -Continue to use nasal spray as needed and recommend daily antihistamine.  Irritable bowel syndrome with both constipation and diarrhea: -Followed by gastroenterology. -On dicyclomine and probiotic. -Recommend dietary modifications.   Meds ordered this encounter  Medications  . carvedilol (COREG) 6.25 MG tablet    Sig: Take 2 tablets (12.5  mg total) by mouth 2 (two) times daily with a meal.    Dispense:  180 tablet    Refill:  3    New Rx dose change    Follow-up: Return in about 4 months (around 05/07/2021) for [facility] and [facility] 1 wk prior .   Note:  This note was prepared with assistance of Dragon voice recognition software. Occasional wrong-word or  sound-a-like substitutions may have occurred due to the inherent limitations of voice recognition software.  Lorrene Reid, PA-C

## 2021-01-07 NOTE — Assessment & Plan Note (Addendum)
-  A1c today 5.6, improved from 5.9 and at goal. -Continue to manage with diet, monitor carbohydrates and glucose. -Continue ambulatory glucose monitoring. -UA microalbumin collected today, normal. -Will continue to monitor.

## 2021-01-24 ENCOUNTER — Ambulatory Visit: Payer: Medicare HMO | Admitting: Gastroenterology

## 2021-01-24 ENCOUNTER — Encounter: Payer: Self-pay | Admitting: Gastroenterology

## 2021-01-24 VITALS — BP 140/78 | HR 78 | Ht 64.0 in | Wt 156.6 lb

## 2021-01-24 DIAGNOSIS — K644 Residual hemorrhoidal skin tags: Secondary | ICD-10-CM

## 2021-01-24 DIAGNOSIS — R143 Flatulence: Secondary | ICD-10-CM | POA: Diagnosis not present

## 2021-01-24 DIAGNOSIS — K580 Irritable bowel syndrome with diarrhea: Secondary | ICD-10-CM | POA: Diagnosis not present

## 2021-01-24 DIAGNOSIS — R140 Abdominal distension (gaseous): Secondary | ICD-10-CM | POA: Diagnosis not present

## 2021-01-24 MED ORDER — RIFAXIMIN 550 MG PO TABS: 550.0000 mg | ORAL_TABLET | Freq: Three times a day (TID) | ORAL | 0 refills | Status: AC

## 2021-01-24 MED ORDER — DICYCLOMINE HCL 10 MG PO CAPS: 10.0000 mg | ORAL_CAPSULE | Freq: Three times a day (TID) | ORAL | 1 refills | Status: DC | PRN

## 2021-01-24 MED ORDER — HYDROCORTISONE (PERIANAL) 2.5 % EX CREA: 1.0000 "application " | TOPICAL_CREAM | Freq: Two times a day (BID) | CUTANEOUS | 1 refills | Status: DC

## 2021-01-24 NOTE — Progress Notes (Signed)
01/24/2021 Andrea Brooks [id number] July 06, 1936   HISTORY OF PRESENT ILLNESS: This is an 85 year old female who is a patient of Dr. Corena Pilgrim.  She made this appointment today primarily due to some rectal bleeding that she has had recently.  It started last Saturday.  She describes it as bright red blood in the bowl and on the toilet paper.  It has tapered off, but she still sees small amounts periodically this week.  She feels like she has a hemorrhoid that is enlarged.  While she is here she also fills me and on her ongoing bowel issues.  She is here with her husband and they both relay how distressed she is with her gut problems.  She has longstanding GI issues and a diagnosis of IBS, but she says that since she had E. coli in September 2021 that her bowel issues have been much worse.  Dr. Corena Pilgrim thought is that she has underlying constipation and has episodes of catharsis with loose stools and urgency/incontinence.  She tells me that she took MiraLAX previously but that it makes her stools too loose.  She is currently taking Bentyl 10 mg as needed.  She takes Zofran intermittently for episodes of nausea and vomiting.  CT scan of the abdomen and pelvis without contrast in August 2019 did not show any concerning issues.  They report about an 80 pound weight loss over the past 2 to 4 years.  Back in December 2018 she weighed 200 pounds, so that is about 44 pounds over the past 3 years.  They describe that she has so much gas/flatulence.  Her husband states "if you spend 24 hours with her then you know that she needs help and that there is something wrong".  Colonoscopy in February 2021 showed diverticulosis and external hemorrhoids.  One diminutive polyp was removed and was a tubular adenoma on pathology.  Random colon biopsies were normal.    Past Medical History:  Diagnosis Date  . Anxiety   . Colon polyps   . Diabetes mellitus without complication ([place])   . Diverticulosis   . Food poisoning    . Gallstones   . GERD (gastroesophageal reflux disease)   . Hyperlipidemia   . Hypertension   . Obesity   . 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 ABDOMINAL HYSTERECTOMY     total  . BREAST BIOPSY Left    neg  . CHOLECYSTECTOMY    . JOINT REPLACEMENT Bilateral    knee  . LEFT HEART CATH AND CORONARY ANGIOGRAPHY N/A 07/04/2018   Procedure: LEFT HEART CATH AND CORONARY ANGIOGRAPHY;  Surgeon: Jettie Booze, MD;  Location: [facility];  Service: Cardiovascular;  Laterality: N/A;  . REPLACEMENT TOTAL KNEE BILATERAL      reports that she quit smoking about 61 years ago. She has a 2.00 pack-year smoking history. She has never used smokeless tobacco. She reports that she does not drink alcohol and does not use drugs. family history includes Bone cancer in her sister; Breast cancer in her maternal aunt and paternal aunt; Colon cancer in her mother; Berenice Primas' disease in her daughter and sister; Heart failure in her father; Hypertension in her mother; Stroke in her brother, father, and sister. Allergies  Allergen Reactions  . Tape Rash and Other (See Comments)    PAPER TAPE ONLY!!!! NO CLEAR, PLASTIC TAPE- TEARS THE  SKIN!!      Outpatient Encounter Medications as of 01/24/2021  Medication Sig  . acetaminophen (TYLENOL) 500 MG tablet Take 1,000 mg by mouth every 6 (six) hours as needed for moderate pain.  Marland Kitchen alum & mag hydroxide-simeth (MAALOX/MYLANTA) 200-200-20 MG/5ML suspension Take 30 mLs by mouth every 6 (six) hours as needed for indigestion or heartburn.  Marland Kitchen aspirin EC 81 MG tablet Take 81 mg by mouth daily.  . calcium carbonate (TUMS - DOSED IN MG ELEMENTAL CALCIUM) 500 MG chewable tablet Chew 2 tablets by mouth as needed for indigestion or heartburn.  . carvedilol (COREG) 6.25 MG tablet Take 2 tablets (12.5 mg total) by mouth 2 (two) times daily with a meal.  .  ezetimibe-simvastatin (VYTORIN) 10-20 MG tablet Take 1 tablet by mouth daily.  . hydrocortisone (ANUSOL-HC) 2.5 % rectal cream Place 1 application rectally 2 (two) times daily.  Marland Kitchen LORazepam (ATIVAN) 1 MG tablet 1/2 tablet daily as needed for anxiety  . metoCLOPramide (REGLAN) 5 MG tablet Take 1 tablet (5 mg total) by mouth every 8 (eight) hours as needed for nausea.  . Multiple Vitamins-Minerals (MULTI FOR HER 50+) TABS Take 1 tablet by mouth daily.  Marland Kitchen omeprazole (PRILOSEC) 20 MG capsule Take 1 capsule by mouth daily, 30 minutes before breakfast.  . Probiotic Product (PROBIOTIC DAILY PO) Take by mouth.   . Probiotic Product (SOLUBLE FIBER/PROBIOTICS) CHEW Chew 2 tablets by mouth daily.  . rifaximin (XIFAXAN) 550 MG TABS tablet Take 1 tablet (550 mg total) by mouth 3 (three) times daily for 14 days.  . valsartan (DIOVAN) 320 MG tablet Take 1 tablet (320 mg total) by mouth daily.  . [DISCONTINUED] dicyclomine (BENTYL) 10 MG capsule Take 1 capsule (10 mg total) by mouth 3 (three) times daily as needed for spasms (IBS flareup). Future refills need to come from GI  . dicyclomine (BENTYL) 10 MG capsule Take 1 capsule (10 mg total) by mouth 3 (three) times daily as needed for spasms (IBS flareup). Future refills need to come from GI   No facility-administered encounter medications on file as of 01/24/2021.     REVIEW OF SYSTEMS  : All other systems reviewed and negative except where noted in the History of Present Illness.   PHYSICAL EXAM: BP 140/78   Pulse 78   Ht 5\' 4"  (1.626 m)   Wt 156 lb 9.6 oz (71 kg)   SpO2 97%   BMI 26.88 kg/m  General: Well developed white female in no acute distress Head: Normocephalic and atraumatic Eyes:  Sclerae anicteric, conjunctiva pink. Ears: Normal auditory acuity Lungs: Clear throughout to auscultation; no W/R/R. Heart: Regular rate and rhythm; no M/R/G. Abdomen: Soft, non-distended.  BS present. Non-tender. Rectal:  Enlarged/inflamed/tender internal  hemorrhoid noted.  DRE did not reveal any masses.  Small amount of light brown stool was seen on the exam glove. Musculoskeletal: Symmetrical with no gross deformities  Skin: No lesions on visible extremities Extremities: No edema  Neurological: Alert oriented x 4, grossly non-focal Psychological:  Alert and cooperative. Normal mood and affect  ASSESSMENT AND PLAN: *IBS with complaints of bloating/gas in alternating bowel habits: Question SIBO.  Dr. Corena Pilgrim thought is that she is under treating constipation and having catharsis with loose stools episodically.  I agree that that certainly may be the case.  I have encouraged her to restart the MiraLAX 1 capful mixed in 8 ounces of liquid daily (had stopped it previoulsy because she said that sometimes it makes her stool too loose)  and also to add Benefiber powder 2 teaspoons mixed in 8 ounces of liquid daily.  I would to empirically treat her with Xifaxan 550 mg 3 times daily for 14 days.  She is very distressed by the amount of gas and bloating that she has.  I will also refill her Bentyl at her request.  Prescriptions sent to pharmacy.  Pending results of the above could consider repeat CT scan of her abdomen/pelvis. *Inflamed external hemorrhoid: Likely the source of her recent bleeding.  We will have her treat it with hydrocortisone cream twice daily for the next 7 to 10 days.  Prescription sent to pharmacy.   CC:  Lorrene Reid, PA-C

## 2021-01-24 NOTE — Patient Instructions (Addendum)
If you are age 83 or older, your body mass index should be between 23-30. Your Body mass index is 26.88 kg/m. If this is out of the aforementioned range listed, please consider follow up with your Primary Care Provider.  If you are age 67 or younger, your body mass index should be between 19-25. Your Body mass index is 26.88 kg/m. If this is out of the aformentioned range listed, please consider follow up with your Primary Care Provider.   We have sent the following medications to your pharmacy for you to pick up at your convenience: Xifaxan 550 mg three times daily for 14 days. Hydrocortisone cream twice daily for 10 days and repeat as needed. Refilled Bentyl.  Miralax 1 capful daily in 8 ounces of liquid.  Start Benefiber  2 teaspoons in 8 ounces of liquid daily.

## 2021-01-24 NOTE — Progress Notes (Signed)
____________________________________________________________  Attending physician addendum:  Thank you for sending this case to me. I have reviewed the entire note and agree with the plan.  I found it difficult to fully characterize the symptoms.  Empiric therapy for SIBO is reasonable.  If rifaximin is prohibitively expensive, then metronidazole is a second line choice.  Wilfrid Lund, MD  ____________________________________________________________

## 2021-02-13 ENCOUNTER — Other Ambulatory Visit: Payer: Self-pay | Admitting: Physician Assistant

## 2021-02-13 DIAGNOSIS — E1159 Type 2 diabetes mellitus with other circulatory complications: Secondary | ICD-10-CM

## 2021-02-24 ENCOUNTER — Telehealth: Payer: Self-pay | Admitting: Gastroenterology

## 2021-02-24 NOTE — Telephone Encounter (Signed)
Pt states that [place] works very well, she would like prescription sent to [facility] [address]. if Janett Billow thinks that it can be taken long term.

## 2021-02-25 NOTE — Telephone Encounter (Signed)
Routing to [employer] thanks

## 2021-02-25 NOTE — Telephone Encounter (Signed)
Please review request and advise

## 2021-02-26 ENCOUNTER — Other Ambulatory Visit: Payer: Self-pay

## 2021-02-26 DIAGNOSIS — I152 Hypertension secondary to endocrine disorders: Secondary | ICD-10-CM

## 2021-02-26 DIAGNOSIS — K580 Irritable bowel syndrome with diarrhea: Secondary | ICD-10-CM

## 2021-02-26 DIAGNOSIS — E1159 Type 2 diabetes mellitus with other circulatory complications: Secondary | ICD-10-CM

## 2021-02-26 DIAGNOSIS — R634 Abnormal weight loss: Secondary | ICD-10-CM

## 2021-02-26 DIAGNOSIS — I5031 Acute diastolic (congestive) heart failure: Secondary | ICD-10-CM

## 2021-02-26 DIAGNOSIS — E119 Type 2 diabetes mellitus without complications: Secondary | ICD-10-CM

## 2021-02-26 DIAGNOSIS — R140 Abdominal distension (gaseous): Secondary | ICD-10-CM

## 2021-02-26 DIAGNOSIS — R194 Change in bowel habit: Secondary | ICD-10-CM

## 2021-02-26 DIAGNOSIS — R1084 Generalized abdominal pain: Secondary | ICD-10-CM

## 2021-02-26 DIAGNOSIS — K529 Noninfective gastroenteritis and colitis, unspecified: Secondary | ICD-10-CM

## 2021-02-26 NOTE — Telephone Encounter (Signed)
Called pt and informed about all orders and scheduling process. States she intends to go to [facility] on 03/06/21. If she is unable to have her CT before 03/06/21, she will not proceed with exam and will ask [facility] to manage her symptoms.

## 2021-02-26 NOTE — Telephone Encounter (Signed)
Following message sent to Rhys Martini and April Pait for scheduling purposes:  [facility]  Phone: [phone]  Fax: [phone]   Patient Name: Andrea Brooks  DOB: 05/01/1936  MRN #: [id number]   Imaging Ordered: CT A/P w/ contrast   Diagnosis: Abd pain, wt loss   Ordering Provider: Alonza Bogus, PA   Is a Prior Authorization needed? We are in the process of obtaining it now   Is the patient Diabetic? Yes   Does the patient have Hypertension? Yes   Does the patient have any implanted devices or hardware? No   Date of last BUN/Creat, if needed? Yes, ordered   Patient Weight? 156#   Is the patient able to get on the table? Yes   Has the patient been diagnosed with COVID? No   Is the patient waiting on COVID testing results? No   Thank you for your assistance!  [facility]

## 2021-02-26 NOTE — Telephone Encounter (Signed)
Sure, I think the CT scan would be reassuring.  Please order abdomen and pelvis with contrast if no allergy and if renal function ok.    Thank you,  Jess

## 2021-02-26 NOTE — Telephone Encounter (Signed)
That is not something that would continue ongoingly.  It is a 2-week course and depending on how long improvement of her symptoms last, we can repeat the course a couple times a year if needed, but it is not something that she takes ongoingly.  Thank you,  Jess

## 2021-02-26 NOTE — Telephone Encounter (Signed)
Spoke with pt and informed of response below. States since she tried the [place], noticed benefits from using for 2 weeks, and with her symptoms now having returned, should she consider having the CT scan as recommended per OV in March?

## 2021-03-03 ENCOUNTER — Other Ambulatory Visit (INDEPENDENT_AMBULATORY_CARE_PROVIDER_SITE_OTHER): Payer: Medicare HMO

## 2021-03-03 DIAGNOSIS — E1159 Type 2 diabetes mellitus with other circulatory complications: Secondary | ICD-10-CM | POA: Diagnosis not present

## 2021-03-03 DIAGNOSIS — I152 Hypertension secondary to endocrine disorders: Secondary | ICD-10-CM | POA: Diagnosis not present

## 2021-03-03 DIAGNOSIS — E119 Type 2 diabetes mellitus without complications: Secondary | ICD-10-CM

## 2021-03-03 DIAGNOSIS — I5031 Acute diastolic (congestive) heart failure: Secondary | ICD-10-CM | POA: Diagnosis not present

## 2021-03-03 LAB — BUN: BUN: 13 mg/dL (ref 6–23)

## 2021-03-03 LAB — CREATININE, SERUM: Creatinine, Ser: 0.75 mg/dL (ref 0.40–1.20)

## 2021-03-10 ENCOUNTER — Telehealth: Payer: Self-pay | Admitting: Gastroenterology

## 2021-03-10 NOTE — Telephone Encounter (Signed)
Dr. Loletha Carrow, please see note below.   Attempted to return call to patient twice to advise that Dr. Loletha Carrow is out of the office this week but her phone just rings. Called mobile number and no vm is set up.

## 2021-03-10 NOTE — Telephone Encounter (Signed)
Pt states she went to the [facility] in [place] for a second opinion regarding her IBS/diverticulitis, they recommended her follow up [facility] but pt states she needs a referral from Dr Loletha Carrow first.

## 2021-03-11 ENCOUNTER — Ambulatory Visit (HOSPITAL_COMMUNITY)
Admission: RE | Admit: 2021-03-11 | Discharge: 2021-03-11 | Disposition: A | Discharge status: Home / Self Care | Payer: Medicare HMO | Source: Ambulatory Visit | Attending: Gastroenterology | Admitting: Gastroenterology

## 2021-03-11 ENCOUNTER — Telehealth: Payer: Self-pay | Admitting: Gastroenterology

## 2021-03-11 ENCOUNTER — Encounter (HOSPITAL_COMMUNITY)
Admission: EM | Disposition: A | Discharge status: Home / Self Care | Payer: Self-pay | Source: Home / Self Care | Attending: Internal Medicine

## 2021-03-11 ENCOUNTER — Encounter (HOSPITAL_COMMUNITY): Payer: Self-pay

## 2021-03-11 ENCOUNTER — Inpatient Hospital Stay (HOSPITAL_COMMUNITY): Payer: Medicare HMO | Admitting: Certified Registered Nurse Anesthetist

## 2021-03-11 ENCOUNTER — Inpatient Hospital Stay (HOSPITAL_COMMUNITY)
Admission: EM | Admit: 2021-03-11 | Discharge: 2021-03-16 | DRG: 336 | Disposition: A | Discharge status: Home / Self Care | Payer: Medicare HMO | Attending: Internal Medicine | Admitting: Internal Medicine

## 2021-03-11 ENCOUNTER — Other Ambulatory Visit: Payer: Self-pay

## 2021-03-11 ENCOUNTER — Encounter (HOSPITAL_COMMUNITY): Payer: Self-pay | Admitting: Emergency Medicine

## 2021-03-11 DIAGNOSIS — K529 Noninfective gastroenteritis and colitis, unspecified: Secondary | ICD-10-CM | POA: Insufficient documentation

## 2021-03-11 DIAGNOSIS — R194 Change in bowel habit: Secondary | ICD-10-CM

## 2021-03-11 DIAGNOSIS — G4700 Insomnia, unspecified: Secondary | ICD-10-CM | POA: Diagnosis present

## 2021-03-11 DIAGNOSIS — I5032 Chronic diastolic (congestive) heart failure: Secondary | ICD-10-CM

## 2021-03-11 DIAGNOSIS — K631 Perforation of intestine (nontraumatic): Secondary | ICD-10-CM

## 2021-03-11 DIAGNOSIS — R1084 Generalized abdominal pain: Secondary | ICD-10-CM | POA: Insufficient documentation

## 2021-03-11 DIAGNOSIS — F419 Anxiety disorder, unspecified: Secondary | ICD-10-CM | POA: Diagnosis present

## 2021-03-11 DIAGNOSIS — R634 Abnormal weight loss: Secondary | ICD-10-CM

## 2021-03-11 DIAGNOSIS — K6389 Other specified diseases of intestine: Secondary | ICD-10-CM | POA: Diagnosis not present

## 2021-03-11 DIAGNOSIS — E669 Obesity, unspecified: Secondary | ICD-10-CM | POA: Diagnosis present

## 2021-03-11 DIAGNOSIS — I110 Hypertensive heart disease with heart failure: Secondary | ICD-10-CM | POA: Diagnosis not present

## 2021-03-11 DIAGNOSIS — Z7984 Long term (current) use of oral hypoglycemic drugs: Secondary | ICD-10-CM | POA: Diagnosis not present

## 2021-03-11 DIAGNOSIS — Z87891 Personal history of nicotine dependence: Secondary | ICD-10-CM | POA: Diagnosis not present

## 2021-03-11 DIAGNOSIS — K439 Ventral hernia without obstruction or gangrene: Secondary | ICD-10-CM | POA: Diagnosis present

## 2021-03-11 DIAGNOSIS — E876 Hypokalemia: Secondary | ICD-10-CM | POA: Diagnosis present

## 2021-03-11 DIAGNOSIS — K9189 Other postprocedural complications and disorders of digestive system: Secondary | ICD-10-CM | POA: Diagnosis not present

## 2021-03-11 DIAGNOSIS — K5740 Diverticulitis of both small and large intestine with perforation and abscess without bleeding: Principal | ICD-10-CM | POA: Diagnosis present

## 2021-03-11 DIAGNOSIS — K567 Ileus, unspecified: Secondary | ICD-10-CM | POA: Diagnosis not present

## 2021-03-11 DIAGNOSIS — I2510 Atherosclerotic heart disease of native coronary artery without angina pectoris: Secondary | ICD-10-CM | POA: Diagnosis present

## 2021-03-11 DIAGNOSIS — Z85828 Personal history of other malignant neoplasm of skin: Secondary | ICD-10-CM | POA: Diagnosis not present

## 2021-03-11 DIAGNOSIS — Z6826 Body mass index (BMI) 26.0-26.9, adult: Secondary | ICD-10-CM

## 2021-03-11 DIAGNOSIS — Z8249 Family history of ischemic heart disease and other diseases of the circulatory system: Secondary | ICD-10-CM

## 2021-03-11 DIAGNOSIS — Z91048 Other nonmedicinal substance allergy status: Secondary | ICD-10-CM

## 2021-03-11 DIAGNOSIS — E1159 Type 2 diabetes mellitus with other circulatory complications: Secondary | ICD-10-CM | POA: Diagnosis present

## 2021-03-11 DIAGNOSIS — K660 Peritoneal adhesions (postprocedural) (postinfection): Secondary | ICD-10-CM | POA: Diagnosis present

## 2021-03-11 DIAGNOSIS — G8929 Other chronic pain: Secondary | ICD-10-CM | POA: Diagnosis present

## 2021-03-11 DIAGNOSIS — K580 Irritable bowel syndrome with diarrhea: Secondary | ICD-10-CM | POA: Diagnosis present

## 2021-03-11 DIAGNOSIS — E785 Hyperlipidemia, unspecified: Secondary | ICD-10-CM | POA: Diagnosis present

## 2021-03-11 DIAGNOSIS — K219 Gastro-esophageal reflux disease without esophagitis: Secondary | ICD-10-CM | POA: Diagnosis present

## 2021-03-11 DIAGNOSIS — Z96653 Presence of artificial knee joint, bilateral: Secondary | ICD-10-CM | POA: Diagnosis present

## 2021-03-11 DIAGNOSIS — R140 Abdominal distension (gaseous): Secondary | ICD-10-CM | POA: Insufficient documentation

## 2021-03-11 DIAGNOSIS — Z7982 Long term (current) use of aspirin: Secondary | ICD-10-CM

## 2021-03-11 DIAGNOSIS — K56609 Unspecified intestinal obstruction, unspecified as to partial versus complete obstruction: Secondary | ICD-10-CM

## 2021-03-11 DIAGNOSIS — Z79899 Other long term (current) drug therapy: Secondary | ICD-10-CM

## 2021-03-11 DIAGNOSIS — Z823 Family history of stroke: Secondary | ICD-10-CM

## 2021-03-11 DIAGNOSIS — Z20822 Contact with and (suspected) exposure to covid-19: Secondary | ICD-10-CM | POA: Diagnosis present

## 2021-03-11 HISTORY — DX: Heart failure, unspecified: I50.9

## 2021-03-11 HISTORY — PX: LAPAROTOMY: SHX154

## 2021-03-11 LAB — CBC WITH DIFFERENTIAL/PLATELET
Abs Immature Granulocytes: 0.02 10*3/uL (ref 0.00–0.07)
Basophils Absolute: 0 10*3/uL (ref 0.0–0.1)
Basophils Relative: 0 %
Eosinophils Absolute: 0.2 10*3/uL (ref 0.0–0.5)
Eosinophils Relative: 3 %
HCT: 36.3 % (ref 36.0–46.0)
Hemoglobin: 11.6 g/dL — ABNORMAL LOW (ref 12.0–15.0)
Immature Granulocytes: 0 %
Lymphocytes Relative: 18 %
Lymphs Abs: 1.3 10*3/uL (ref 0.7–4.0)
MCH: 28.8 pg (ref 26.0–34.0)
MCHC: 32 g/dL (ref 30.0–36.0)
MCV: 90.1 fL (ref 80.0–100.0)
Monocytes Absolute: 0.6 10*3/uL (ref 0.1–1.0)
Monocytes Relative: 9 %
Neutro Abs: 5.1 10*3/uL (ref 1.7–7.7)
Neutrophils Relative %: 70 %
Platelets: 261 10*3/uL (ref 150–400)
RBC: 4.03 MIL/uL (ref 3.87–5.11)
RDW: 13.9 % (ref 11.5–15.5)
WBC: 7.3 10*3/uL (ref 4.0–10.5)
nRBC: 0 % (ref 0.0–0.2)

## 2021-03-11 LAB — URINALYSIS, ROUTINE W REFLEX MICROSCOPIC
Bilirubin Urine: NEGATIVE
Glucose, UA: NEGATIVE mg/dL
Hgb urine dipstick: NEGATIVE
Ketones, ur: NEGATIVE mg/dL
Leukocytes,Ua: NEGATIVE
Nitrite: NEGATIVE
Protein, ur: NEGATIVE mg/dL
Specific Gravity, Urine: >1.046 — ABNORMAL HIGH (ref 1.005–1.030)
pH: 5 (ref 5.0–8.0)

## 2021-03-11 LAB — TYPE AND SCREEN
ABO/RH(D): O POS
Antibody Screen: NEGATIVE

## 2021-03-11 LAB — COMPREHENSIVE METABOLIC PANEL
ALT: 19 U/L (ref 0–44)
AST: 24 U/L (ref 15–41)
Albumin: 2.5 g/dL — ABNORMAL LOW (ref 3.5–5.0)
Alkaline Phosphatase: 61 U/L (ref 38–126)
Anion gap: 6 (ref 5–15)
BUN: 8 mg/dL (ref 8–23)
CO2: 24 mmol/L (ref 22–32)
Calcium: 7.5 mg/dL — ABNORMAL LOW (ref 8.9–10.3)
Chloride: 105 mmol/L (ref 98–111)
Creatinine, Ser: 0.74 mg/dL (ref 0.44–1.00)
GFR, Estimated: >60 mL/min (ref >60)
Glucose, Bld: 113 mg/dL — ABNORMAL HIGH (ref 70–99)
Potassium: 3.3 mmol/L — ABNORMAL LOW (ref 3.5–5.1)
Sodium: 135 mmol/L (ref 135–145)
Total Bilirubin: 0.3 mg/dL (ref 0.3–1.2)
Total Protein: 6.4 g/dL — ABNORMAL LOW (ref 6.5–8.1)

## 2021-03-11 LAB — RESP PANEL BY RT-PCR (FLU A&B, COVID) ARPGX2
Influenza A by PCR: NEGATIVE
Influenza B by PCR: NEGATIVE
SARS Coronavirus 2 by RT PCR: NEGATIVE

## 2021-03-11 LAB — GLUCOSE, CAPILLARY: Glucose-Capillary: 84 mg/dL (ref 70–99)

## 2021-03-11 LAB — LACTIC ACID, PLASMA: Lactic Acid, Venous: 1.2 mmol/L (ref 0.5–1.9)

## 2021-03-11 LAB — LIPASE, BLOOD: Lipase: 27 U/L (ref 11–51)

## 2021-03-11 LAB — ABO/RH: ABO/RH(D): O POS

## 2021-03-11 SURGERY — LAPAROTOMY, EXPLORATORY
Anesthesia: General | Site: Abdomen

## 2021-03-11 MED ORDER — PIPERACILLIN-TAZOBACTAM 3.375 G IVPB 30 MIN
3.3750 g | Freq: Once | INTRAVENOUS | Status: AC
  Administered 2021-03-11: 3.375 g via INTRAVENOUS
  Filled 2021-03-11: qty 50

## 2021-03-11 MED ORDER — LACTATED RINGERS IV SOLN: INTRAVENOUS | Status: DC | PRN

## 2021-03-11 MED ORDER — SUCCINYLCHOLINE CHLORIDE 20 MG/ML IJ SOLN
INTRAMUSCULAR | Status: DC | PRN
  Administered 2021-03-11: 100 mg via INTRAVENOUS

## 2021-03-11 MED ORDER — FENTANYL CITRATE (PF) 100 MCG/2ML IJ SOLN
INTRAMUSCULAR | Status: DC | PRN
  Administered 2021-03-11: 25 ug via INTRAVENOUS
  Administered 2021-03-11: 50 ug via INTRAVENOUS
  Administered 2021-03-11: 100 ug via INTRAVENOUS
  Administered 2021-03-11: 75 ug via INTRAVENOUS

## 2021-03-11 MED ORDER — 0.9 % SODIUM CHLORIDE (POUR BTL) OPTIME
TOPICAL | Status: DC | PRN
  Administered 2021-03-11: 2000 mL

## 2021-03-11 MED ORDER — FENTANYL CITRATE (PF) 100 MCG/2ML IJ SOLN
25.0000 ug | INTRAMUSCULAR | Status: DC | PRN
  Administered 2021-03-11 (×4): 25 ug via INTRAVENOUS

## 2021-03-11 MED ORDER — FENTANYL CITRATE (PF) 250 MCG/5ML IJ SOLN
INTRAMUSCULAR | Status: AC
  Filled 2021-03-11: qty 5

## 2021-03-11 MED ORDER — PHENYLEPHRINE HCL (PRESSORS) 10 MG/ML IV SOLN
INTRAVENOUS | Status: DC | PRN
  Administered 2021-03-11: 80 ug via INTRAVENOUS

## 2021-03-11 MED ORDER — PROPOFOL 10 MG/ML IV BOLUS
INTRAVENOUS | Status: AC
  Filled 2021-03-11: qty 20

## 2021-03-11 MED ORDER — AMISULPRIDE (ANTIEMETIC) 5 MG/2ML IV SOLN
INTRAVENOUS | Status: AC
  Filled 2021-03-11: qty 2

## 2021-03-11 MED ORDER — OXYCODONE HCL 5 MG/5ML PO SOLN: 5.0000 mg | Freq: Once | ORAL | Status: DC | PRN

## 2021-03-11 MED ORDER — PIPERACILLIN-TAZOBACTAM 3.375 G IVPB
3.3750 g | Freq: Three times a day (TID) | INTRAVENOUS | Status: DC
  Administered 2021-03-12: 3.375 g via INTRAVENOUS
  Filled 2021-03-11: qty 50

## 2021-03-11 MED ORDER — FENTANYL CITRATE (PF) 100 MCG/2ML IJ SOLN
INTRAMUSCULAR | Status: AC
  Filled 2021-03-11: qty 2

## 2021-03-11 MED ORDER — SUGAMMADEX SODIUM 200 MG/2ML IV SOLN
INTRAVENOUS | Status: DC | PRN
  Administered 2021-03-11: 160 mg via INTRAVENOUS

## 2021-03-11 MED ORDER — OXYCODONE HCL 5 MG PO TABS: 5.0000 mg | ORAL_TABLET | Freq: Once | ORAL | Status: DC | PRN

## 2021-03-11 MED ORDER — KETOROLAC TROMETHAMINE 15 MG/ML IJ SOLN
15.0000 mg | Freq: Four times a day (QID) | INTRAMUSCULAR | Status: DC | PRN
  Administered 2021-03-11 – 2021-03-14 (×3): 15 mg via INTRAVENOUS
  Filled 2021-03-11 (×2): qty 1

## 2021-03-11 MED ORDER — ONDANSETRON HCL 4 MG/2ML IJ SOLN
INTRAMUSCULAR | Status: DC | PRN
  Administered 2021-03-11: 4 mg via INTRAVENOUS

## 2021-03-11 MED ORDER — LABETALOL HCL 5 MG/ML IV SOLN
INTRAVENOUS | Status: AC
  Filled 2021-03-11: qty 4

## 2021-03-11 MED ORDER — ROCURONIUM BROMIDE 100 MG/10ML IV SOLN
INTRAVENOUS | Status: DC | PRN
  Administered 2021-03-11: 50 mg via INTRAVENOUS

## 2021-03-11 MED ORDER — IOHEXOL 300 MG/ML  SOLN
100.0000 mL | Freq: Once | INTRAMUSCULAR | Status: AC | PRN
  Administered 2021-03-11: 100 mL via INTRAVENOUS

## 2021-03-11 MED ORDER — DEXAMETHASONE SODIUM PHOSPHATE 10 MG/ML IJ SOLN
INTRAMUSCULAR | Status: DC | PRN
  Administered 2021-03-11: 10 mg via INTRAVENOUS

## 2021-03-11 MED ORDER — PROPOFOL 10 MG/ML IV BOLUS
INTRAVENOUS | Status: DC | PRN
  Administered 2021-03-11: 100 mg via INTRAVENOUS

## 2021-03-11 MED ORDER — ONDANSETRON HCL 4 MG/2ML IJ SOLN
4.0000 mg | Freq: Four times a day (QID) | INTRAMUSCULAR | Status: AC | PRN
  Administered 2021-03-11: 4 mg via INTRAVENOUS

## 2021-03-11 MED ORDER — ACETAMINOPHEN 10 MG/ML IV SOLN
INTRAVENOUS | Status: AC
  Filled 2021-03-11: qty 100

## 2021-03-11 MED ORDER — LABETALOL HCL 5 MG/ML IV SOLN
INTRAVENOUS | Status: DC | PRN
  Administered 2021-03-11 (×2): 5 mg via INTRAVENOUS

## 2021-03-11 MED ORDER — ESMOLOL HCL 100 MG/10ML IV SOLN
INTRAVENOUS | Status: DC | PRN
  Administered 2021-03-11: 20 mg via INTRAVENOUS

## 2021-03-11 MED ORDER — AMISULPRIDE (ANTIEMETIC) 5 MG/2ML IV SOLN
10.0000 mg | Freq: Once | INTRAVENOUS | Status: AC
  Administered 2021-03-11: 10 mg via INTRAVENOUS

## 2021-03-11 MED ORDER — ACETAMINOPHEN 10 MG/ML IV SOLN
1000.0000 mg | Freq: Four times a day (QID) | INTRAVENOUS | Status: AC
  Administered 2021-03-11 – 2021-03-12 (×4): 1000 mg via INTRAVENOUS
  Filled 2021-03-11 (×3): qty 100

## 2021-03-11 MED ORDER — LIDOCAINE HCL (CARDIAC) PF 100 MG/5ML IV SOSY
PREFILLED_SYRINGE | INTRAVENOUS | Status: DC | PRN
  Administered 2021-03-11: 50 mg via INTRAVENOUS

## 2021-03-11 MED ORDER — KETOROLAC TROMETHAMINE 15 MG/ML IJ SOLN
INTRAMUSCULAR | Status: AC
  Filled 2021-03-11: qty 1

## 2021-03-11 MED ORDER — ONDANSETRON HCL 4 MG/2ML IJ SOLN
INTRAMUSCULAR | Status: AC
  Filled 2021-03-11: qty 2

## 2021-03-11 SURGICAL SUPPLY — 38 items
BLADE CLIPPER SURG (BLADE) IMPLANT
CANISTER SUCT 3000ML PPV (MISCELLANEOUS) IMPLANT
CHLORAPREP W/TINT 26 (MISCELLANEOUS) ×4 IMPLANT
COVER SURGICAL LIGHT HANDLE (MISCELLANEOUS) ×2 IMPLANT
COVER WAND RF STERILE (DRAPES) ×2 IMPLANT
DRAPE LAPAROSCOPIC ABDOMINAL (DRAPES) ×2 IMPLANT
DRAPE WARM FLUID 44X44 (DRAPES) ×2 IMPLANT
DRSG OPSITE POSTOP 4X10 (GAUZE/BANDAGES/DRESSINGS) IMPLANT
DRSG OPSITE POSTOP 4X12 (GAUZE/BANDAGES/DRESSINGS) ×2 IMPLANT
DRSG OPSITE POSTOP 4X8 (GAUZE/BANDAGES/DRESSINGS) IMPLANT
ELECT BLADE 6.5 EXT (BLADE) IMPLANT
ELECT CAUTERY BLADE 6.4 (BLADE) ×2 IMPLANT
ELECT REM PT RETURN 9FT ADLT (ELECTROSURGICAL) ×2
ELECTRODE REM PT RTRN 9FT ADLT (ELECTROSURGICAL) ×1 IMPLANT
GLOVE BIO SURGEON STRL SZ 6 (GLOVE) ×2 IMPLANT
GLOVE SURG UNDER LTX SZ6.5 (GLOVE) ×2 IMPLANT
GOWN STRL REUS W/ TWL LRG LVL3 (GOWN DISPOSABLE) ×4 IMPLANT
GOWN STRL REUS W/TWL LRG LVL3 (GOWN DISPOSABLE) ×4
HANDLE SUCTION POOLE (INSTRUMENTS) ×1 IMPLANT
KIT BASIN OR (CUSTOM PROCEDURE TRAY) ×2 IMPLANT
KIT TURNOVER KIT B (KITS) ×2 IMPLANT
LIGASURE IMPACT 36 18CM CVD LR (INSTRUMENTS) IMPLANT
NS IRRIG 1000ML POUR BTL (IV SOLUTION) ×4 IMPLANT
PACK GENERAL/GYN (CUSTOM PROCEDURE TRAY) ×2 IMPLANT
PAD ARMBOARD 7.5X6 YLW CONV (MISCELLANEOUS) ×2 IMPLANT
PENCIL SMOKE EVACUATOR (MISCELLANEOUS) ×2 IMPLANT
SPECIMEN JAR LARGE (MISCELLANEOUS) IMPLANT
SPONGE LAP 18X18 RF (DISPOSABLE) IMPLANT
STAPLER VISISTAT 35W (STAPLE) ×2 IMPLANT
SUCTION POOLE HANDLE (INSTRUMENTS) ×2
SUT PDS AB 1 TP1 96 (SUTURE) ×4 IMPLANT
SUT SILK 2 0 SH CR/8 (SUTURE) ×2 IMPLANT
SUT SILK 2 0 TIES 10X30 (SUTURE) ×2 IMPLANT
SUT SILK 3 0 SH CR/8 (SUTURE) ×2 IMPLANT
SUT SILK 3 0 TIES 10X30 (SUTURE) ×2 IMPLANT
SUT VIC AB 3-0 SH 18 (SUTURE) IMPLANT
TOWEL GREEN STERILE (TOWEL DISPOSABLE) ×2 IMPLANT
TRAY FOLEY MTR SLVR 16FR STAT (SET/KITS/TRAYS/PACK) ×2 IMPLANT

## 2021-03-11 NOTE — ED Notes (Signed)
Per GI-states had CT done-shows bowel ischemia-sending to ED for surgery consult

## 2021-03-11 NOTE — ED Notes (Signed)
Pt ambulatory to bathroom and back.

## 2021-03-11 NOTE — Consult Note (Signed)
Surgical Evaluation Requesting provider: Dr. Tomi Bamberger  Chief Complaint: -chronic abdominal pain, abnormal CT  HPI: This is a very pleasant 85 year old woman referred to the emergency room today by her gastroenterologist after an outpatient CT scan revealed pneumatosis and pneumoperitoneum.  She has a long history of abdominal pain and gastrointestinal issues, endorsing an 80 pound weight loss over the last 4 years.  She notes decreased appetite, postprandial distention and pain which is relieved by vomiting or having bowel movements.  She has been given a diagnosis of IBS, suspected to have undertreated constipation with intermittent catharsis with loose stool episodes.  She had E. coli enteritis in September 2021 and since then her digestive issues have been significantly worse.  She endorses a lot of flatulence.  She was evaluated at the [facility] clinic and has been followed here in town by Dr. Loletha Carrow.  She states most recently (mid March) she was treated with a 2-week course of rifaximin for suspected SIBO which did give her some relief, but once the antibiotics were stopped her symptoms returned full force.  She followed up again by phone about 2 weeks ago and at this time a CT scan of the abdomen pelvis was ordered.  This was done today with findings below, and she was contacted and directed to come to the emergency room for further evaluation.    The patient does not feel well, but states that she does not feel any worse than she did 3 weeks ago.  She has not had any fevers.  Denies any recent vomiting and is not currently nauseated.  Reports that she had several formed bowel movements today.  Her husband who is with her and her daughter who is on the phone corroborate her long and complex GI history.    Allergies  Allergen Reactions  . Tape Rash and Other (See Comments)    PAPER TAPE ONLY!!!! NO CLEAR, PLASTIC TAPE- TEARS THE SKIN!!    Past Medical History:  Diagnosis Date  . Anxiety   . CHF  (congestive heart failure) ([place])   . Colon polyps   . Diabetes mellitus without complication ([place])   . Diverticulosis   . Food poisoning   . Gallstones   . GERD (gastroesophageal reflux disease)   . Hyperlipidemia   . Hypertension   . Obesity   . 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 ABDOMINAL HYSTERECTOMY     total  . BREAST BIOPSY Left    neg  . CHOLECYSTECTOMY    . JOINT REPLACEMENT Bilateral    knee  . LEFT HEART CATH AND CORONARY ANGIOGRAPHY N/A 07/04/2018   Procedure: LEFT HEART CATH AND CORONARY ANGIOGRAPHY;  Surgeon: Jettie Booze, MD;  Location: [facility];  Service: Cardiovascular;  Laterality: N/A;  . REPLACEMENT TOTAL KNEE BILATERAL      Family History  Problem Relation Age of Onset  . Hypertension Mother   . Colon cancer Mother   . Stroke Father   . Heart failure Father   . Stroke Sister   . Stroke Brother   . Graves' disease Daughter   . Breast cancer Paternal Aunt   . Graves' disease Sister   . Bone cancer Sister   . Breast cancer Maternal Aunt   . Esophageal cancer Neg Hx   . Rectal cancer Neg Hx   . Liver cancer Neg Hx  Social History   Socioeconomic History  . Marital status: Married    Spouse name: Not on file  . Number of children: 1  . Years of education: Not on file  . Highest education level: Not on file  Occupational History  . Occupation: retired  Tobacco Use  . Smoking status: Former Smoker    Packs/day: 1.00    Years: 2.00    Pack years: 2.00    Quit date: 11/24/1959    Years since quitting: 61.3  . Smokeless tobacco: Never Used  Vaping Use  . Vaping Use: Never used  Substance and Sexual Activity  . Alcohol use: No  . Drug use: No  . Sexual activity: Not Currently  Other Topics Concern  . Not on file  Social History Narrative  . Not on file   Social Determinants of Health   Financial  Resource Strain: Not on file  Food Insecurity: Not on file  Transportation Needs: Not on file  Physical Activity: Not on file  Stress: Not on file  Social Connections: Not on file    No current facility-administered medications on file prior to encounter.   Current Outpatient Medications on File Prior to Encounter  Medication Sig Dispense Refill  . acetaminophen (TYLENOL) 500 MG tablet Take 1,000 mg by mouth every 6 (six) hours as needed for moderate pain.    Marland Kitchen alum & mag hydroxide-simeth (MAALOX/MYLANTA) 200-200-20 MG/5ML suspension Take 30 mLs by mouth every 6 (six) hours as needed for indigestion or heartburn.    Marland Kitchen aspirin EC 81 MG tablet Take 81 mg by mouth daily.    . calcium carbonate (TUMS - DOSED IN MG ELEMENTAL CALCIUM) 500 MG chewable tablet Chew 2 tablets by mouth as needed for indigestion or heartburn.    . carvedilol (COREG) 6.25 MG tablet Take 2 tablets (12.5 mg total) by mouth 2 (two) times daily with a meal. 180 tablet 3  . dicyclomine (BENTYL) 10 MG capsule Take 1 capsule (10 mg total) by mouth 3 (three) times daily as needed for spasms (IBS flareup). Future refills need to come from GI 30 capsule 1  . ezetimibe-simvastatin (VYTORIN) 10-20 MG tablet Take 1 tablet by mouth daily. 90 tablet 1  . hydrocortisone (ANUSOL-HC) 2.5 % rectal cream Place 1 application rectally 2 (two) times daily. 30 g 1  . LORazepam (ATIVAN) 1 MG tablet 1/2 tablet daily as needed for anxiety 30 tablet 0  . metoCLOPramide (REGLAN) 5 MG tablet Take 1 tablet (5 mg total) by mouth every 8 (eight) hours as needed for nausea. 12 tablet 0  . Multiple Vitamins-Minerals (MULTI FOR HER 50+) TABS Take 1 tablet by mouth daily.    Marland Kitchen omeprazole (PRILOSEC) 20 MG capsule Take 1 capsule by mouth daily, 30 minutes before breakfast. 90 capsule 3  . Probiotic Product (PROBIOTIC DAILY PO) Take by mouth.     . Probiotic Product (SOLUBLE FIBER/PROBIOTICS) CHEW Chew 2 tablets by mouth daily.    . valsartan (DIOVAN) 320 MG  tablet TAKE 1 TABLET DAILY 90 tablet 0    Review of Systems: a complete, 10pt review of systems was completed with pertinent positives and negatives as documented in the HPI  Physical Exam: Vitals:   03/11/21 1853 03/11/21 1930  BP:  (!) 147/64  Pulse: 72 67  Resp: 18 (!) 29  Temp:    SpO2: 99% 98%   Gen: A&Ox3, no distress but chronically ill-appearing Eyes: lids and conjunctivae normal, no icterus. Pupils equally round and reactive to  light.  Neck: supple without mass or thyromegaly Chest: respiratory effort is normal. No crepitus or tenderness on palpation of the chest. Breath sounds equal.  Cardiovascular: RRR with palpable distal pulses, no pedal edema Gastrointestinal: soft, very distended, tender across central abdomen without peritoneal signs.. No mass, hepatomegaly or splenomegaly.  Well-healed right subcostal and Pfannenstiel scars Lymphatic: no lymphadenopathy in the neck or groin Muscoloskeletal: no clubbing or cyanosis of the fingers.  Strength is symmetrical throughout.  Range of motion of bilateral upper and lower extremities normal without pain, crepitation or contracture. Neuro: cranial nerves grossly intact.  Sensation intact to light touch diffusely. Psych: appropriate mood and affect, normal insight/judgment intact  Skin: warm and dry   CBC Latest Ref Rng & Units 03/11/2021 11/11/2020 08/25/2020  WBC 4.0 - 10.5 K/uL 7.3 6.2 10.5  Hemoglobin 12.0 - 15.0 g/dL 11.6(L) 12.8 13.6  Hematocrit 36.0 - 46.0 % 36.3 38.8 42.3  Platelets 150 - 400 K/uL 261 224 245    CMP Latest Ref Rng & Units 03/11/2021 03/03/2021 11/11/2020  Glucose 70 - 99 mg/dL 113(H) - 109(H)  BUN 8 - 23 mg/dL 8 13 9   Creatinine 0.44 - 1.00 mg/dL 0.74 0.75 0.85  Sodium 135 - 145 mmol/L 135 - 143  Potassium 3.5 - 5.1 mmol/L 3.3(L) - 4.3  Chloride 98 - 111 mmol/L 105 - 105  CO2 22 - 32 mmol/L 24 - 26  Calcium 8.9 - 10.3 mg/dL 7.5(L) - 9.2  Total Protein 6.5 - 8.1 g/dL 6.4(L) - 7.2  Total Bilirubin  0.3 - 1.2 mg/dL 0.3 - 0.5  Alkaline Phos 38 - 126 U/L 61 - 82  AST 15 - 41 U/L 24 - 19  ALT 0 - 44 U/L 19 - 12    Lab Results  Component Value Date   INR 1.28 07/03/2018    Imaging: CT Abdomen Pelvis W Contrast  Result Date: 03/11/2021 CLINICAL DATA:  Abdominal pain for couple years, weight loss EXAM: CT ABDOMEN AND PELVIS WITH CONTRAST TECHNIQUE: Multidetector CT imaging of the abdomen and pelvis was performed using the standard protocol following bolus administration of intravenous contrast. CONTRAST:  16mL OMNIPAQUE IOHEXOL 300 MG/ML SOLN, additional oral enteric contrast COMPARISON:  06/28/2018 FINDINGS: Lower chest: No acute abnormality. There is mild irregular peripheral interstitial opacity and septal thickening in the included bilateral lung bases, which appears increased compared to prior examination dated 2019. Coronary artery calcifications. Hepatobiliary: No focal liver abnormality is seen. Status post cholecystectomy. Postoperative biliary dilatation. Pancreas: Unremarkable. No pancreatic ductal dilatation or surrounding inflammatory changes. Spleen: Normal in size without significant abnormality. Adrenals/Urinary Tract: Adrenal glands are unremarkable. Kidneys are normal, without renal calculi, solid lesion, or hydronephrosis. Bladder is unremarkable. Stomach/Bowel: Stomach is within normal limits. There are multiple distended loops of mid small bowel in the ventral abdomen measuring up to 5.7 cm in caliber, several loops demonstrating substantial pneumatosis (e.g. Series 2, image 30). The contents of the small bowel are almost entirely fecalized. Swirling of the mesenteric vessels in the central abdomen (series 2, image 41). There is no congenital malrotation of the small bowel. Sigmoid diverticulosis. Vascular/Lymphatic: Aortic atherosclerosis. The mesenteric vessels are patent on this non tailored, non angiographic examination. No enlarged abdominal or pelvic lymph nodes.  Reproductive: No mass or other significant abnormality. Other: No abdominal wall hernia or abnormality. There is small volume pneumoperitoneum throughout the abdomen, including in the lesser sac. Trace free fluid in the low pelvis. Musculoskeletal: No acute or significant osseous findings. IMPRESSION: 1. There  are multiple distended loops of mid small bowel in the ventral abdomen measuring up to 5.7 cm in caliber, several loops demonstrating substantial pneumatosis. Swirling of the mesenteric vessels in the central abdomen. Findings are consistent with small bowel obstruction or ileus and ischemia of the bowel, which may be due to internal hernia or bowel volvulus. Note that there is normal configuration of the duodenum without evidence of congenital malrotation of the small bowel. 2. The mesenteric vessels appear patent on this non tailored, non angiographic examination. 3. Small volume pneumoperitoneum, consistent with bowel perforation. Exact nidus of perforation is not clearly appreciated, but loops of bowel demonstrating severe pneumatosis in the ventral abdomen are suspicious. 4. There is mild irregular peripheral interstitial opacity and septal thickening in the included bilateral lung bases, which appears increased compared to prior examination dated 2019. Findings are concerning for fibrotic interstitial lung disease, and could be further evaluated by dedicated ILD protocol CT of the chest on a nonemergent basis when clinically appropriate. 5. Coronary artery disease. Findings were discussed by telephone with Delrae Rend, RN, on behalf of Alonza Bogus, [place] at 4:50 p.m., 03/11/2021. Aortic Atherosclerosis (ICD10-I70.0). Electronically Signed   By: Eddie Candle M.D.   On: 03/11/2021 16:51     A/P: 85 year old woman with longstanding history of abdominal pain and GI issues now presents with imaging concerning for ischemic bowel with perforation.  She looks remarkably well compared to her CT scan.  She is  afebrile, no tachycardia, no leukocytosis or AKI.  Her abdomen is quite distended and mildly tender but not peritonitic.  I had a long discussion with the patient and her husband as well as her daughter who was on the phone.  I also discussed her case with 2 of my partners.  Given the timeline, it is feasible that the radiographic findings are sequela of something that occurred several weeks ago and I shared my concern that at this stage of inflammation if that is the case, hers will be a hostile abdomen with increased risk of enterotomy or need to abort the case.  On the other hand, she really has had no improvement in the last several weeks and with the data we now have it is suspicious that she has had a chronic intermittent obstruction likely due to adhesive disease as one of the contributing factors to her symptoms.   Despite how well she looks and her fairly benign abdominal exam, I do recommend abdominal exploration and likely bowel resection.  We discussed the surgery in detail and I went over with them risks of bleeding, infection, pain, scarring, injury to bowel or other intra-abdominal structures, failure to achieve the aim of the operation which is to relieve her chronic abdominal pain and remove the point of obstruction/perforation, need for open abdomen, need for an ostomy, delayed intra-abdominal abscess formation, enteric or enteral atmospheric fistula, anastomotic leak or stricture, wound healing problems, hernia, recurrent obstruction or ileus, as well as general cardiovascular/pulmonary/thromboembolic risks and prolonged hospitalization and recovery.  Questions were welcomed and answered to their satisfaction.  She wishes to proceed.    Patient Active Problem List   Diagnosis Date Noted  . Bloating 01/24/2021  . Flatulence 01/24/2021  . External hemorrhoids 01/24/2021  . Encounter for Medicare annual wellness exam 04/04/2019  . Chronic thoracic back pain 01/03/2019  . Acute congestive  heart failure ([place])   . Non-ST elevation (NSTEMI) myocardial infarction ([place])   . Acute diastolic (congestive) heart failure ([place]) 07/02/2018  . Elevated troponin  07/02/2018  . Diabetes mellitus without complication ([place]) 39/67/2897  . Diverticulitis-  mild symptoms 03/30/2018  . Irritable bowel syndrome with diarrhea 03/30/2018  . Healthcare maintenance 03/17/2018  . Pain of right heel 07/06/2017  . Loose stools 06/24/2017  . Right-sided thoracic back pain 06/24/2017  . Hyperlipidemia 04/22/2017  . GERD (gastroesophageal reflux disease) 04/22/2017  . Impaired glucose metabolism 04/22/2017  . Hypertension associated with diabetes ([place]) 04/22/2017  . Anxiety 04/22/2017       Romana Juniper, MD [facility]  See AMION to contact appropriate on-call provider

## 2021-03-11 NOTE — H&P (Signed)
History and Physical    Andrea Brooks EXN:[id number] DOB: 04-01-1936 DOA: 03/11/2021  PCP: Lorrene Reid, PA-C  Patient coming from: Home  I have personally briefly reviewed patient's old medical records in [place]  Chief Complaint: Abnormal CT scan  HPI: Andrea Brooks is a 85 y.o. female with medical history significant for IBS with chronic diarrhea and progressive weight loss (? SIBO), chronic diastolic CHF, HTN, HLD, nonobstructive CAD who presents to the ED for evaluation of abnormal CT imaging.  Patient reports chronic abdominal pain and diarrhea with approximately 80 pound weight loss over the last year.  Over the last few months she has had worsening of midline abdominal pain and has been afraid to eat due to fear of recurrent pain and emesis.  This is worsened over the last few weeks.  She notes symptoms are most significant after dinner at which time she feels significant abdominal bloating which only finds relief with belching or passing gas.  She had outpatient CT imaging of the abdomen/pelvis today which showed changes concerning for ischemic bowel with perforation.  She was advised to come to the ED for further evaluation and management.  ED Course:  Initial vitals showed BP 136/76, pulse 81, RR 22, temp 99.0 F, SPO2 100% on room air.  Labs show no BBC 7.3, hemoglobin 11.6, platelets 261,000, sodium 135, potassium 3.3, bicarb 24, BUN 8, creatinine 0.74, serum glucose 113, LFTs within normal limits, lipase 27.  SARS-CoV-2 PCR is obtained and pending.  Urinalysis is negative for UTI.  Patient was ordered to receive IV Zosyn.  General surgery was consulted and after discussion decision was made to proceed with an abdominal exploration with likely bowel resection tonight.  Medical admission was requested and hospitalist service was consulted to admit for further evaluation and management.  Review of Systems: All systems reviewed and are negative except as documented  in history of present illness above.   Past Medical History:  Diagnosis Date  . Anxiety   . CHF (congestive heart failure) ([place])   . Colon polyps   . Diabetes mellitus without complication ([place])   . Diverticulosis   . Food poisoning   . Gallstones   . GERD (gastroesophageal reflux disease)   . Hyperlipidemia   . Hypertension   . Obesity   . 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 ABDOMINAL HYSTERECTOMY     total  . BREAST BIOPSY Left    neg  . CHOLECYSTECTOMY    . JOINT REPLACEMENT Bilateral    knee  . LEFT HEART CATH AND CORONARY ANGIOGRAPHY N/A 07/04/2018   Procedure: LEFT HEART CATH AND CORONARY ANGIOGRAPHY;  Surgeon: Jettie Booze, MD;  Location: [facility];  Service: Cardiovascular;  Laterality: N/A;  . REPLACEMENT TOTAL KNEE BILATERAL      Social History:  reports that she quit smoking about 61 years ago. She has a 2.00 pack-year smoking history. She has never used smokeless tobacco. She reports that she does not drink alcohol and does not use drugs.  Allergies  Allergen Reactions  . Tape Rash and Other (See Comments)    PAPER TAPE ONLY!!!! NO CLEAR, PLASTIC TAPE- TEARS THE SKIN!!    Family History  Problem Relation Age of Onset  . Hypertension Mother   . Colon cancer Mother   . Stroke Father   . Heart failure Father   .  Stroke Sister   . Stroke Brother   . Graves' disease Daughter   . Breast cancer Paternal Aunt   . Graves' disease Sister   . Bone cancer Sister   . Breast cancer Maternal Aunt   . Esophageal cancer Neg Hx   . Rectal cancer Neg Hx   . Liver cancer Neg Hx      Prior to Admission medications   Medication Sig Start Date End Date Taking? Authorizing Provider  acetaminophen (TYLENOL) 500 MG tablet Take 1,000 mg by mouth every 6 (six) hours as needed for moderate pain.    [provider]  alum & mag  hydroxide-simeth (MAALOX/MYLANTA) 200-200-20 MG/5ML suspension Take 30 mLs by mouth every 6 (six) hours as needed for indigestion or heartburn.    [provider]  aspirin EC 81 MG tablet Take 81 mg by mouth daily.    [provider]  calcium carbonate (TUMS - DOSED IN MG ELEMENTAL CALCIUM) 500 MG chewable tablet Chew 2 tablets by mouth as needed for indigestion or heartburn.    [provider]  carvedilol (COREG) 6.25 MG tablet Take 2 tablets (12.5 mg total) by mouth 2 (two) times daily with a meal. 01/07/21   Abonza, Maritza, PA-C  dicyclomine (BENTYL) 10 MG capsule Take 1 capsule (10 mg total) by mouth 3 (three) times daily as needed for spasms (IBS flareup). Future refills need to come from GI 01/24/21   Zehr, Laban Emperor, PA-C  ezetimibe-simvastatin (VYTORIN) 10-20 MG tablet Take 1 tablet by mouth daily. 10/02/20   Lorrene Reid, PA-C  hydrocortisone (ANUSOL-HC) 2.5 % rectal cream Place 1 application rectally 2 (two) times daily. 01/24/21   Zehr, Laban Emperor, PA-C  LORazepam (ATIVAN) 1 MG tablet 1/2 tablet daily as needed for anxiety 10/02/20   Abonza, Maritza, PA-C  metoCLOPramide (REGLAN) 5 MG tablet Take 1 tablet (5 mg total) by mouth every 8 (eight) hours as needed for nausea. 02/16/19   Danford, [name] D, NP  Multiple Vitamins-Minerals (MULTI FOR HER 50+) TABS Take 1 tablet by mouth daily.    [provider]  omeprazole (PRILOSEC) 20 MG capsule Take 1 capsule by mouth daily, 30 minutes before breakfast. 08/26/20   Danis, Kirke Corin, MD  Probiotic Product (PROBIOTIC DAILY PO) Take by mouth.     [provider]  Probiotic Product (SOLUBLE FIBER/PROBIOTICS) CHEW Chew 2 tablets by mouth daily.    [provider]  valsartan (DIOVAN) 320 MG tablet TAKE 1 TABLET DAILY 02/13/21   Lorrene Reid, PA-C    Physical Exam: Vitals:   03/11/21 2015 03/11/21 2019 03/11/21 2045 03/11/21 2056  BP: (!) 173/68  (!) 160/87 (!) 151/79  Pulse: 78 76 79 66  Resp:  (!) 24 16 (!) 23 (!) 23  Temp:    98.1 F (36.7 C)  TempSrc:    Oral  SpO2: 99% 99% 98% 100%  Weight:       Constitutional: Resting in bed with head elevated, NAD, calm, comfortable Eyes: PERRL, lids and conjunctivae normal ENMT: Mucous membranes are moist. Posterior pharynx clear of any exudate or lesions.Normal dentition.  Neck: normal, supple, no masses. Respiratory: clear to auscultation bilaterally, no wheezing, no crackles. Normal respiratory effort. No accessory muscle use.  Cardiovascular: Regular rate and rhythm, no murmurs / rubs / gallops. No extremity edema. 2+ pedal pulses. Abdomen: Distended taut abdomen with no tenderness. No hepatosplenomegaly. Bowel sounds diminished.  Musculoskeletal: no clubbing / cyanosis. No joint deformity upper and lower extremities. Good ROM,  no contractures. Normal muscle tone.  Skin: no rashes, lesions, ulcers. No induration Neurologic: CN 2-12 grossly intact. Sensation intact. Strength 5/5 in all 4.  Psychiatric: Normal judgment and insight. Alert and oriented x 3. Normal mood.   Labs on Admission: I have personally reviewed following labs and imaging studies  CBC: Recent Labs  Lab 03/11/21 1846  WBC 7.3  NEUTROABS 5.1  HGB 11.6*  HCT 36.3  MCV 90.1  PLT 086   Basic Metabolic Panel: Recent Labs  Lab 03/11/21 1846  NA 135  K 3.3*  CL 105  CO2 24  GLUCOSE 113*  BUN 8  CREATININE 0.74  CALCIUM 7.5*   GFR: Estimated Creatinine Clearance: 52 mL/min (by C-G formula based on SCr of 0.74 mg/dL). Liver Function Tests: Recent Labs  Lab 03/11/21 1846  AST 24  ALT 19  ALKPHOS 61  BILITOT 0.3  PROT 6.4*  ALBUMIN 2.5*   Recent Labs  Lab 03/11/21 1846  LIPASE 27   No results for input(s): AMMONIA in the last 168 hours. Coagulation Profile: No results for input(s): INR, PROTIME in the last 168 hours. Cardiac Enzymes: No results for input(s): CKTOTAL, CKMB, CKMBINDEX, TROPONINI in the last 168 hours. BNP (last 3  results) No results for input(s): PROBNP in the last 8760 hours. HbA1C: No results for input(s): HGBA1C in the last 72 hours. CBG: No results for input(s): GLUCAP in the last 168 hours. Lipid Profile: No results for input(s): CHOL, HDL, LDLCALC, TRIG, CHOLHDL, LDLDIRECT in the last 72 hours. Thyroid Function Tests: No results for input(s): TSH, T4TOTAL, FREET4, T3FREE, THYROIDAB in the last 72 hours. Anemia Panel: No results for input(s): VITAMINB12, FOLATE, FERRITIN, TIBC, IRON, RETICCTPCT in the last 72 hours. Urine analysis:    Component Value Date/Time   COLORURINE YELLOW 03/11/2021 1807   APPEARANCEUR CLEAR 03/11/2021 1807   LABSPEC >1.046 (H) 03/11/2021 1807   PHURINE 5.0 03/11/2021 1807   GLUCOSEU NEGATIVE 03/11/2021 1807   HGBUR NEGATIVE 03/11/2021 1807   BILIRUBINUR NEGATIVE 03/11/2021 1807   KETONESUR NEGATIVE 03/11/2021 1807   PROTEINUR NEGATIVE 03/11/2021 1807   NITRITE NEGATIVE 03/11/2021 1807   LEUKOCYTESUR NEGATIVE 03/11/2021 1807    Radiological Exams on Admission: CT Abdomen Pelvis W Contrast  Result Date: 03/11/2021 CLINICAL DATA:  Abdominal pain for couple years, weight loss EXAM: CT ABDOMEN AND PELVIS WITH CONTRAST TECHNIQUE: Multidetector CT imaging of the abdomen and pelvis was performed using the standard protocol following bolus administration of intravenous contrast. CONTRAST:  152mL OMNIPAQUE IOHEXOL 300 MG/ML SOLN, additional oral enteric contrast COMPARISON:  06/28/2018 FINDINGS: Lower chest: No acute abnormality. There is mild irregular peripheral interstitial opacity and septal thickening in the included bilateral lung bases, which appears increased compared to prior examination dated 2019. Coronary artery calcifications. Hepatobiliary: No focal liver abnormality is seen. Status post cholecystectomy. Postoperative biliary dilatation. Pancreas: Unremarkable. No pancreatic ductal dilatation or surrounding inflammatory changes. Spleen: Normal in size without  significant abnormality. Adrenals/Urinary Tract: Adrenal glands are unremarkable. Kidneys are normal, without renal calculi, solid lesion, or hydronephrosis. Bladder is unremarkable. Stomach/Bowel: Stomach is within normal limits. There are multiple distended loops of mid small bowel in the ventral abdomen measuring up to 5.7 cm in caliber, several loops demonstrating substantial pneumatosis (e.g. Series 2, image 30). The contents of the small bowel are almost entirely fecalized. Swirling of the mesenteric vessels in the central abdomen (series 2, image 41). There is no congenital malrotation of the small bowel. Sigmoid diverticulosis. Vascular/Lymphatic: Aortic atherosclerosis. The mesenteric vessels  are patent on this non tailored, non angiographic examination. No enlarged abdominal or pelvic lymph nodes. Reproductive: No mass or other significant abnormality. Other: No abdominal wall hernia or abnormality. There is small volume pneumoperitoneum throughout the abdomen, including in the lesser sac. Trace free fluid in the low pelvis. Musculoskeletal: No acute or significant osseous findings. IMPRESSION: 1. There are multiple distended loops of mid small bowel in the ventral abdomen measuring up to 5.7 cm in caliber, several loops demonstrating substantial pneumatosis. Swirling of the mesenteric vessels in the central abdomen. Findings are consistent with small bowel obstruction or ileus and ischemia of the bowel, which may be due to internal hernia or bowel volvulus. Note that there is normal configuration of the duodenum without evidence of congenital malrotation of the small bowel. 2. The mesenteric vessels appear patent on this non tailored, non angiographic examination. 3. Small volume pneumoperitoneum, consistent with bowel perforation. Exact nidus of perforation is not clearly appreciated, but loops of bowel demonstrating severe pneumatosis in the ventral abdomen are suspicious. 4. There is mild irregular  peripheral interstitial opacity and septal thickening in the included bilateral lung bases, which appears increased compared to prior examination dated 2019. Findings are concerning for fibrotic interstitial lung disease, and could be further evaluated by dedicated ILD protocol CT of the chest on a nonemergent basis when clinically appropriate. 5. Coronary artery disease. Findings were discussed by telephone with Andrea Rend, RN, on behalf of Andrea Brooks, [place] at 4:50 p.m., 03/11/2021. Aortic Atherosclerosis (ICD10-I70.0). Electronically Signed   By: Eddie Candle M.D.   On: 03/11/2021 16:51    EKG: Not performed.  Assessment/Plan Principal Problem:   Bowel perforation ([place]) Active Problems:   Hyperlipidemia   Hypertension associated with diabetes ([place])   Chronic diastolic CHF (congestive heart failure) ([facility])   Andrea Brooks is a 85 y.o. female with medical history significant for IBS with chronic diarrhea and progressive weight loss (? SIBO), chronic diastolic CHF, HTN, HLD, nonobstructive CAD who is admitted with CT findings concerning for ischemic bowel with perforation.  Ischemic bowel with concern for perforation: Seen on outpatient CT imaging same day of admission 4/19.  General surgery has been consulted and plan for abdominal exploration with likely bowel resection tonight.  Chronic diastolic CHF: Last EF 36-14% in 2019.  Stable without evidence of hypervolemia.  Not requiring diuretics as an outpatient.  Hypertension: Coreg and valsartan on hold with pending surgery.  Hyperlipidemia: Vytorin on hold.  Nonobstructive CAD: Stable without any chest pain.  Aspirin on hold.  DVT prophylaxis: SCDs Code Status: Full code Family Communication: Discussed with patient's husband at bedside Disposition Plan: From home, dispo pending surgical intervention Consults called: General surgery Level of care: Med-Surg Admission status:  Status is: Inpatient  Remains inpatient appropriate  because:IV treatments appropriate due to intensity of illness or inability to take PO and Inpatient level of care appropriate due to severity of illness   Dispo: The patient is from: Home              Anticipated d/c is to: Home              Patient currently is not medically stable to d/c.   Difficult to place patient No  Zada Finders MD [facility]  If 7PM-7AM, please contact night-coverage [email or web address]  03/11/2021, 9:10 PM

## 2021-03-11 NOTE — ED Triage Notes (Signed)
Emergency Medicine Provider Triage Evaluation Note  Andrea Brooks , a 85 y.o. female  was evaluated in triage.  Pt complains abdominal pain. Had CT done this AM.  CT reads SBO with small area of pneumoperitoneum consistent with bowel pef.   Review of Systems  Positive: abdominal pain Negative: No current nausea or vomiting   Physical Exam  There were no vitals taken for this visit. Gen:   Awake, no distress   HEENT:  Atraumatic  Resp:  Normal effort  Cardiac:  Normal rate  Abd:   Generalized abdominal tenderness  MSK:   Moves extremities without difficulty  Neuro:  Speech clear   Medical Decision Making  Medically screening exam initiated at 5:53 PM.  Appropriate orders placed.  Andrea Brooks was informed that the remainder of the evaluation will be completed by another provider, this initial triage assessment does not replace that evaluation, and the importance of remaining in the ED until their evaluation is complete.  Clinical Impression  Pt needs to move back to room, triage nurse aware     Alfredia Client, PA-C 03/11/21 1806

## 2021-03-11 NOTE — Anesthesia Procedure Notes (Signed)
Procedure Name: Intubation Date/Time: 03/11/2021 9:19 PM Performed by: Jearld Pies, CRNA Pre-anesthesia Checklist: Patient identified, Emergency Drugs available, Suction available and Patient being monitored Patient Re-evaluated:Patient Re-evaluated prior to induction Oxygen Delivery Method: Circle System Utilized Preoxygenation: Pre-oxygenation with 100% oxygen Induction Type: IV induction, Rapid sequence and Cricoid Pressure applied Laryngoscope Size: Glidescope and 3 Grade View: Grade I Tube type: Oral Tube size: 7.0 mm Number of attempts: 1 Airway Equipment and Method: Stylet and Video-laryngoscopy Placement Confirmation: ETT inserted through vocal cords under direct vision,  positive ETCO2 and breath sounds checked- equal and bilateral Secured at: 20 cm Tube secured with: Tape Dental Injury: Teeth and Oropharynx as per pre-operative assessment  Comments: Limited mouth opening due to right sided mouth swelling d/t previous dental crown placement. Glidescope also utilized due to unknown covid status.

## 2021-03-11 NOTE — ED Notes (Addendum)
Dr. Kae Heller at bedside. Per Dr Kae Heller wait on NG tube at this time as pt is not nauseated or vomiting.

## 2021-03-11 NOTE — ED Notes (Signed)
Spoke to OR on phone. Notified this RN to take pt straight up to OR

## 2021-03-11 NOTE — ED Provider Notes (Signed)
[facility] EMERGENCY DEPARTMENT Provider Note   CSN: [id number] Arrival date & time: 03/11/21  1749     History Chief Complaint  Patient presents with  . Abdominal Pain    Andrea Brooks is a 85 y.o. female.  HPI      Andrea Brooks is a 85 y.o. female, with a history of anxiety, CHF, DM, GERD, hyperlipidemia, HTN, presenting to the ED with increased abdominal pain for the past 3 weeks.  Also endorses decreased appetite and nausea. Patient was seen by GI in the office, CT scan was ordered as an outpatient, scan was performed today.  CT showed evidence of bowel obstruction with perforation and suspicion for ischemia.  Patient was sent to the ED for further management.  Patient denies fever/chills, vomiting, diarrhea, constipation, hematochezia/melena, chest pain, shortness of breath, or any other complaints.   Past Medical History:  Diagnosis Date  . Anxiety   . CHF (congestive heart failure) ([place])   . Colon polyps   . Diabetes mellitus without complication ([place])   . Diverticulosis   . Food poisoning   . Gallstones   . GERD (gastroesophageal reflux disease)   . Hyperlipidemia   . Hypertension   . Obesity   . 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 Bowel perforation ([place]) 03/11/2021  . Bloating 01/24/2021  . Flatulence 01/24/2021  . External hemorrhoids 01/24/2021  . Encounter for Medicare annual wellness exam 04/04/2019  . Chronic thoracic back pain 01/03/2019  . Acute congestive heart failure ([place])   . Non-ST elevation (NSTEMI) myocardial infarction ([place])   . Acute diastolic (congestive) heart failure ([place]) 07/02/2018  . Elevated troponin 07/02/2018  . Diabetes mellitus without complication ([place]) 32/95/1884  . Diverticulitis-  mild symptoms 03/30/2018  . Irritable bowel syndrome with diarrhea 03/30/2018  . Healthcare maintenance  03/17/2018  . Pain of right heel 07/06/2017  . Loose stools 06/24/2017  . Right-sided thoracic back pain 06/24/2017  . Hyperlipidemia 04/22/2017  . GERD (gastroesophageal reflux disease) 04/22/2017  . Impaired glucose metabolism 04/22/2017  . Hypertension associated with diabetes ([place]) 04/22/2017  . Anxiety 04/22/2017    Past Surgical History:  Procedure Laterality Date  . ABDOMINAL HYSTERECTOMY     total  . BREAST BIOPSY Left    neg  . CHOLECYSTECTOMY    . JOINT REPLACEMENT Bilateral    knee  . LEFT HEART CATH AND CORONARY ANGIOGRAPHY N/A 07/04/2018   Procedure: LEFT HEART CATH AND CORONARY ANGIOGRAPHY;  Surgeon: Jettie Booze, MD;  Location: [facility];  Service: Cardiovascular;  Laterality: N/A;  . REPLACEMENT TOTAL KNEE BILATERAL       OB History   No obstetric history on file.     Family History  Problem Relation Age of Onset  . Hypertension Mother   . Colon cancer Mother   . Stroke Father   . Heart failure Father   . Stroke Sister   . Stroke Brother   . Graves' disease Daughter   . Breast cancer Paternal Aunt   . Graves' disease Sister   . Bone cancer Sister   . Breast cancer Maternal Aunt   . Esophageal cancer Neg Hx   . Rectal cancer Neg Hx   . Liver cancer Neg Hx     Social History   Tobacco Use  . Smoking status: Former Smoker  Packs/day: 1.00    Years: 2.00    Pack years: 2.00    Quit date: 11/24/1959    Years since quitting: 61.3  . Smokeless tobacco: Never Used  Vaping Use  . Vaping Use: Never used  Substance Use Topics  . Alcohol use: No  . Drug use: No    Home Medications Prior to Admission medications   Medication Sig Start Date End Date Taking? Authorizing Provider  acetaminophen (TYLENOL) 500 MG tablet Take 1,000 mg by mouth every 6 (six) hours as needed for moderate pain.    [provider]  alum & mag hydroxide-simeth (MAALOX/MYLANTA) 200-200-20 MG/5ML suspension Take 30 mLs by mouth every 6 (six) hours as  needed for indigestion or heartburn.    [provider]  aspirin EC 81 MG tablet Take 81 mg by mouth daily.    [provider]  calcium carbonate (TUMS - DOSED IN MG ELEMENTAL CALCIUM) 500 MG chewable tablet Chew 2 tablets by mouth as needed for indigestion or heartburn.    [provider]  carvedilol (COREG) 6.25 MG tablet Take 2 tablets (12.5 mg total) by mouth 2 (two) times daily with a meal. 01/07/21   Abonza, Maritza, PA-C  dicyclomine (BENTYL) 10 MG capsule Take 1 capsule (10 mg total) by mouth 3 (three) times daily as needed for spasms (IBS flareup). Future refills need to come from GI 01/24/21   Zehr, Laban Emperor, PA-C  ezetimibe-simvastatin (VYTORIN) 10-20 MG tablet Take 1 tablet by mouth daily. 10/02/20   Lorrene Reid, PA-C  hydrocortisone (ANUSOL-HC) 2.5 % rectal cream Place 1 application rectally 2 (two) times daily. 01/24/21   Zehr, Laban Emperor, PA-C  LORazepam (ATIVAN) 1 MG tablet 1/2 tablet daily as needed for anxiety 10/02/20   Abonza, Maritza, PA-C  metoCLOPramide (REGLAN) 5 MG tablet Take 1 tablet (5 mg total) by mouth every 8 (eight) hours as needed for nausea. 02/16/19   Danford, [name] D, NP  Multiple Vitamins-Minerals (MULTI FOR HER 50+) TABS Take 1 tablet by mouth daily.    [provider]  omeprazole (PRILOSEC) 20 MG capsule Take 1 capsule by mouth daily, 30 minutes before breakfast. 08/26/20   Danis, Kirke Corin, MD  Probiotic Product (PROBIOTIC DAILY PO) Take by mouth.     [provider]  Probiotic Product (SOLUBLE FIBER/PROBIOTICS) CHEW Chew 2 tablets by mouth daily.    [provider]  valsartan (DIOVAN) 320 MG tablet TAKE 1 TABLET DAILY 02/13/21   Lorrene Reid, PA-C    Allergies    Tape  Review of Systems   Review of Systems  Constitutional: Negative for chills and fever.  Respiratory: Negative for shortness of breath.   Cardiovascular: Negative for chest pain.  Gastrointestinal: Positive for abdominal distention,  abdominal pain and nausea. Negative for blood in stool, constipation, diarrhea and vomiting.  Neurological: Negative for syncope and weakness.  All other systems reviewed and are negative.   Physical Exam Updated Vital Signs BP (!) 147/64   Pulse 67   Temp 99 F (37.2 C) (Oral)   Resp (!) 29   SpO2 98%   Physical Exam Vitals and nursing note reviewed.  Constitutional:      General: She is not in acute distress.    Appearance: She is well-developed. She is not diaphoretic.  HENT:     Head: Normocephalic and atraumatic.     Mouth/Throat:     Mouth: Mucous membranes are moist.     Pharynx: Oropharynx is clear.  Eyes:  Conjunctiva/sclera: Conjunctivae normal.  Cardiovascular:     Rate and Rhythm: Normal rate and regular rhythm.     Pulses: Normal pulses.          Radial pulses are 2+ on the right side and 2+ on the left side.       Posterior tibial pulses are 2+ on the right side and 2+ on the left side.     Comments: Tactile temperature in the extremities appropriate and equal bilaterally. Pulmonary:     Effort: Pulmonary effort is normal. No respiratory distress.  Abdominal:     General: There is distension.     Palpations: Abdomen is soft.     Tenderness: There is abdominal tenderness in the periumbilical area and left lower quadrant. There is no guarding.  Musculoskeletal:     Cervical back: Neck supple.     Right lower leg: No edema.     Left lower leg: No edema.  Skin:    General: Skin is warm and dry.  Neurological:     Mental Status: She is alert.  Psychiatric:        Mood and Affect: Mood and affect normal.        Speech: Speech normal.        Behavior: Behavior normal.     ED Results / Procedures / Treatments   Labs (all labs ordered are listed, but only abnormal results are displayed) Labs Reviewed  CBC WITH DIFFERENTIAL/PLATELET - Abnormal; Notable for the following components:      Result Value   Hemoglobin 11.6 (*)    All other components  within normal limits  COMPREHENSIVE METABOLIC PANEL - Abnormal; Notable for the following components:   Potassium 3.3 (*)    Glucose, Bld 113 (*)    Calcium 7.5 (*)    Total Protein 6.4 (*)    Albumin 2.5 (*)    All other components within normal limits  URINALYSIS, ROUTINE W REFLEX MICROSCOPIC - Abnormal; Notable for the following components:   Specific Gravity, Urine >1.046 (*)    All other components within normal limits  RESP PANEL BY RT-PCR (FLU A&B, COVID) ARPGX2  LIPASE, BLOOD  LACTIC ACID, PLASMA  LACTIC ACID, PLASMA    EKG None  Radiology CT Abdomen Pelvis W Contrast  Result Date: 03/11/2021 CLINICAL DATA:  Abdominal pain for couple years, weight loss EXAM: CT ABDOMEN AND PELVIS WITH CONTRAST TECHNIQUE: Multidetector CT imaging of the abdomen and pelvis was performed using the standard protocol following bolus administration of intravenous contrast. CONTRAST:  139mL OMNIPAQUE IOHEXOL 300 MG/ML SOLN, additional oral enteric contrast COMPARISON:  06/28/2018 FINDINGS: Lower chest: No acute abnormality. There is mild irregular peripheral interstitial opacity and septal thickening in the included bilateral lung bases, which appears increased compared to prior examination dated 2019. Coronary artery calcifications. Hepatobiliary: No focal liver abnormality is seen. Status post cholecystectomy. Postoperative biliary dilatation. Pancreas: Unremarkable. No pancreatic ductal dilatation or surrounding inflammatory changes. Spleen: Normal in size without significant abnormality. Adrenals/Urinary Tract: Adrenal glands are unremarkable. Kidneys are normal, without renal calculi, solid lesion, or hydronephrosis. Bladder is unremarkable. Stomach/Bowel: Stomach is within normal limits. There are multiple distended loops of mid small bowel in the ventral abdomen measuring up to 5.7 cm in caliber, several loops demonstrating substantial pneumatosis (e.g. Series 2, image 30). The contents of the small  bowel are almost entirely fecalized. Swirling of the mesenteric vessels in the central abdomen (series 2, image 41). There is no congenital malrotation of the small bowel.  Sigmoid diverticulosis. Vascular/Lymphatic: Aortic atherosclerosis. The mesenteric vessels are patent on this non tailored, non angiographic examination. No enlarged abdominal or pelvic lymph nodes. Reproductive: No mass or other significant abnormality. Other: No abdominal wall hernia or abnormality. There is small volume pneumoperitoneum throughout the abdomen, including in the lesser sac. Trace free fluid in the low pelvis. Musculoskeletal: No acute or significant osseous findings. IMPRESSION: 1. There are multiple distended loops of mid small bowel in the ventral abdomen measuring up to 5.7 cm in caliber, several loops demonstrating substantial pneumatosis. Swirling of the mesenteric vessels in the central abdomen. Findings are consistent with small bowel obstruction or ileus and ischemia of the bowel, which may be due to internal hernia or bowel volvulus. Note that there is normal configuration of the duodenum without evidence of congenital malrotation of the small bowel. 2. The mesenteric vessels appear patent on this non tailored, non angiographic examination. 3. Small volume pneumoperitoneum, consistent with bowel perforation. Exact nidus of perforation is not clearly appreciated, but loops of bowel demonstrating severe pneumatosis in the ventral abdomen are suspicious. 4. There is mild irregular peripheral interstitial opacity and septal thickening in the included bilateral lung bases, which appears increased compared to prior examination dated 2019. Findings are concerning for fibrotic interstitial lung disease, and could be further evaluated by dedicated ILD protocol CT of the chest on a nonemergent basis when clinically appropriate. 5. Coronary artery disease. Findings were discussed by telephone with Delrae Rend, RN, on behalf of  Alonza Bogus, [place] at 4:50 p.m., 03/11/2021. Aortic Atherosclerosis (ICD10-I70.0). Electronically Signed   By: Eddie Candle M.D.   On: 03/11/2021 16:51    Procedures .Critical Care Performed by: Lorayne Bender, PA-C Authorized by: Lorayne Bender, PA-C   Critical care provider statement:    Critical care time (minutes):  35   Critical care time was exclusive of:  Separately billable procedures and treating other patients   Critical care was necessary to treat or prevent imminent or life-threatening deterioration of the following conditions: Small bowel obstruction, bowel perforation, bowel ischemia.   Critical care was time spent personally by me on the following activities:  Ordering and performing treatments and interventions, ordering and review of laboratory studies, ordering and review of radiographic studies, pulse oximetry, re-evaluation of patient's condition, review of old charts, discussions with consultants, development of treatment plan with patient or surrogate, evaluation of patient's response to treatment, examination of patient and obtaining history from patient or surrogate   I assumed direction of critical care for this patient from another provider in my specialty: no     Care discussed with: admitting provider       Medications Ordered in ED Medications  piperacillin-tazobactam (ZOSYN) IVPB 3.375 g (has no administration in time range)    Followed by  piperacillin-tazobactam (ZOSYN) IVPB 3.375 g (has no administration in time range)  0.9 % irrigation (POUR BTL) (2,000 mLs Irrigation Given 03/11/21 2048)    ED Course  I have reviewed the triage vital signs and the nursing notes.  Pertinent labs & imaging results that were available during my care of the patient were reviewed by me and considered in my medical decision making (see chart for details).  Clinical Course as of 03/11/21 2102  Tue Mar 11, 2021  1947 Spoke with Dr. Kae Heller, general surgeon.  She has evaluated the  patient and reviewed the CT scan. Requests we start the patient on Zosyn.  Place NG tube.  Admit via hospitalist.  She will  consult with her surgical partner and will come to a decision regarding surgery. [SJ]  2018 Spoke with Dr. Posey Pronto, hospitalist.  Agrees to admit the patient. [SJ]    Clinical Course User Index [SJ] Maryjo Ragon C, PA-C   MDM Rules/Calculators/A&P                          Patient presents with persistent abdominal pain as well as distention and nausea. Patient is nontoxic appearing, afebrile, not tachycardic, not tachypneic, not hypotensive, maintains excellent SPO2 on room air, and is in no apparent distress.   I have reviewed the patient's chart to obtain more information.   I reviewed and interpreted the patient's labs and radiological studies. CT with evidence of bowel obstruction, perforation, and suspicion for ischemia. No leukocytosis.  No acute anemia. Patient to be admitted for further management.  General surgery consulted.  Findings and plan of care discussed with attending physician, Dorie Rank, MD.   Vitals:   03/11/21 1757 03/11/21 1845 03/11/21 1853 03/11/21 1930  BP: 136/76 (!) 147/74  (!) 147/64  Pulse: 81 74 72 67  Resp: (!) 22 (!) 32 18 (!) 29  Temp: 99 F (37.2 C)     TempSrc: Oral     SpO2: 100% 99% 99% 98%     Final Clinical Impression(s) / ED Diagnoses Final diagnoses:  SBO (small bowel obstruction) ([place])  Bowel perforation [facility])    Rx / DC Orders ED Discharge Orders    None       Layla Maw 03/11/21 2103    Dorie Rank, MD 03/13/21 [phone]

## 2021-03-11 NOTE — ED Triage Notes (Signed)
Pt sent by [facility], hx abdominal pain/distension, CT scan done today that shows SBO.

## 2021-03-11 NOTE — ED Notes (Signed)
Dr. Patel at bedside 

## 2021-03-11 NOTE — Anesthesia Preprocedure Evaluation (Signed)
Anesthesia Evaluation  Patient identified by MRN, date of birth, ID band Patient awake    Reviewed: Allergy & Precautions, H&P , NPO status , Patient's Chart, lab work & pertinent test results  Airway Mallampati: II   Neck ROM: full    Dental   Pulmonary former smoker,    breath sounds clear to auscultation       Cardiovascular hypertension, +CHF   Rhythm:regular Rate:Normal  TTE (2019): EF 07-37%, mild diastolic HF Cath (1062): mild non-obstructive CAD   Neuro/Psych    GI/Hepatic GERD  ,IBS   Endo/Other  diabetes, Type 2  Renal/GU      Musculoskeletal   Abdominal   Peds  Hematology   Anesthesia Other Findings   Reproductive/Obstetrics                             Anesthesia Physical Anesthesia Plan  ASA: III and emergent  Anesthesia Plan: General   Post-op Pain Management:    Induction: Intravenous  PONV Risk Score and Plan: 3 and Ondansetron, Dexamethasone and Treatment may vary due to age or medical condition  Airway Management Planned: Oral ETT  Additional Equipment:   Intra-op Plan:   Post-operative Plan: Extubation in OR  Informed Consent: I have reviewed the patients History and Physical, chart, labs and discussed the procedure including the risks, benefits and alternatives for the proposed anesthesia with the patient or authorized representative who has indicated his/her understanding and acceptance.     Dental advisory given  Plan Discussed with: CRNA, Anesthesiologist and Surgeon  Anesthesia Plan Comments:         Anesthesia Quick Evaluation

## 2021-03-11 NOTE — Op Note (Addendum)
Operative Note  Andrea Brooks  [id number]  [id number]  03/11/2021   Surgeon: Victorino Sparrow ConnorMD  Assistant: Reather Laurence MD  Procedure performed: Exploratory laparotomy, lysis of adhesions x15 minutes  Preop diagnosis: Pneumatosis and pneumoperitoneum   Post-op diagnosis/intraop findings: No evidence of ischemic bowel or perforation.  The small bowel is essentially diffusely chronically dilated, thickened and edematous, heavy with a long mobile mesentery suspicious for intermittent volvulus.  There are what appear to be small diverticula throughout the small bowel, most concentrated in the mid jejunum, and there was air tracking along the visceral peritoneum with multiple air pockets along the omentum and the surface of the small bowel  Specimens: no Retained items: no EBL: minimal cc Complications: none  Description of procedure: After obtaining informed consent the patient was taken to the operating room and placed supine on operating room table wheregeneral endotracheal anesthesia was initiated, preoperative antibiotics were administered, SCDs applied, and a formal timeout was performed.  A Foley catheter was inserted as well as an NG tube.  The abdomen was prepped and draped in usual sterile fashion.    A midline laparotomy was created and omental adhesions to the lower abdominal wall were taken down with cautery until the omentum could be reflected cephalad and the small bowel exposed.  There was a solitary adhesive band in the right lower quadrant which did not appear to be the source of obstruction but this was lysed with cautery.  The small bowel was eviscerated.  It is diffusely dilated, chronically thickened/edematous and quite heavy with a long mobile mesentery.  There was really no obvious transition point and if a small bowel volvulus had been present it was reduced in running the bowel.  We did note diffuse small bowel small diverticula most prominent in the mid jejunum,  and a very unusual finding of clusters of air just under the visceral peritoneum along the small bowel surface, mesentery, and the omentum.  The small bowel was examined from the ligament of Treitz to the ileocecal valve several times and everything appeared to be viable with no evidence of perforation.  The colon was inspected and also appeared viable with no evidence of perforation or inflammation.  There are adhesions in the right upper quadrant which are left in situ. The stomach appeared normal.  The NG tube is palpated in the stomach.  The small bowel was thus returned to the abdominal cavity in normal anatomic orientation ensuring no twist of the mesentery.  The omentum was brought down over the bowel and the fascia was then closed with running looped #1 PDS starting at either end and tying centrally.  The skin was closed with staples followed by the honeycomb dressing. The patient was then awakened, extubated and taken to PACU in stable condition.   All counts were correct at the completion of the case.    Omentum and small bowel photographs below:

## 2021-03-11 NOTE — Telephone Encounter (Signed)
Radiology called her in regards to her CT scan.  Concern for bowel ischemia and perforation.  Patient was directed to the emergency department.  I reached out to the on-call surgeon with [facility], Dr. Zenia Resides, and made her aware of the patient.  I also spoke with the charge nurse in the emergency department at [facility] for her to be on look out for the patient as well.

## 2021-03-11 NOTE — Transfer of Care (Signed)
Immediate Anesthesia Transfer of Care Note  Patient: Andrea Brooks  Procedure(s) Performed: EXPLORATORY LAPAROTOMY (N/A Abdomen)  Patient Location: PACU  Anesthesia Type:General  Level of Consciousness: awake, alert  and oriented  Airway & Oxygen Therapy: Patient Spontanous Breathing and Patient connected to nasal cannula oxygen  Post-op Assessment: Report given to RN and Post -op Vital signs reviewed and stable  Post vital signs: Reviewed and stable  Last Vitals:  Vitals Value Taken Time  BP 184/87 (labetalol given) 03/11/21 2228  Temp    Pulse 63 03/11/21 2234  Resp 21 03/11/21 2234  SpO2 100 % 03/11/21 2234  Vitals shown include unvalidated device data.  Last Pain:  Vitals:   03/11/21 2056  TempSrc: Oral  PainSc:          Complications: No complications documented.

## 2021-03-12 ENCOUNTER — Encounter (HOSPITAL_COMMUNITY): Payer: Self-pay | Admitting: Surgery

## 2021-03-12 ENCOUNTER — Inpatient Hospital Stay (HOSPITAL_COMMUNITY): Payer: Medicare HMO

## 2021-03-12 DIAGNOSIS — K580 Irritable bowel syndrome with diarrhea: Secondary | ICD-10-CM

## 2021-03-12 DIAGNOSIS — K6389 Other specified diseases of intestine: Secondary | ICD-10-CM

## 2021-03-12 DIAGNOSIS — R140 Abdominal distension (gaseous): Secondary | ICD-10-CM

## 2021-03-12 LAB — COMPREHENSIVE METABOLIC PANEL
ALT: 16 U/L (ref 0–44)
AST: 21 U/L (ref 15–41)
Albumin: 2.1 g/dL — ABNORMAL LOW (ref 3.5–5.0)
Alkaline Phosphatase: 50 U/L (ref 38–126)
Anion gap: 7 (ref 5–15)
BUN: 9 mg/dL (ref 8–23)
CO2: 25 mmol/L (ref 22–32)
Calcium: 7.1 mg/dL — ABNORMAL LOW (ref 8.9–10.3)
Chloride: 104 mmol/L (ref 98–111)
Creatinine, Ser: 0.86 mg/dL (ref 0.44–1.00)
GFR, Estimated: >60 mL/min (ref >60)
Glucose, Bld: 155 mg/dL — ABNORMAL HIGH (ref 70–99)
Potassium: 3.7 mmol/L (ref 3.5–5.1)
Sodium: 136 mmol/L (ref 135–145)
Total Bilirubin: 0.6 mg/dL (ref 0.3–1.2)
Total Protein: 5.5 g/dL — ABNORMAL LOW (ref 6.5–8.1)

## 2021-03-12 LAB — CBC
HCT: 35.7 % — ABNORMAL LOW (ref 36.0–46.0)
Hemoglobin: 11.7 g/dL — ABNORMAL LOW (ref 12.0–15.0)
MCH: 28.8 pg (ref 26.0–34.0)
MCHC: 32.8 g/dL (ref 30.0–36.0)
MCV: 87.9 fL (ref 80.0–100.0)
Platelets: 235 10*3/uL (ref 150–400)
RBC: 4.06 MIL/uL (ref 3.87–5.11)
RDW: 14 % (ref 11.5–15.5)
WBC: 9.8 10*3/uL (ref 4.0–10.5)
nRBC: 0 % (ref 0.0–0.2)

## 2021-03-12 LAB — PHOSPHORUS: Phosphorus: 5 mg/dL — ABNORMAL HIGH (ref 2.5–4.6)

## 2021-03-12 LAB — MAGNESIUM: Magnesium: 0.9 mg/dL — CL (ref 1.7–2.4)

## 2021-03-12 MED ORDER — MAGNESIUM SULFATE 4 GM/100ML IV SOLN
4.0000 g | Freq: Once | INTRAVENOUS | Status: AC
  Administered 2021-03-12: 4 g via INTRAVENOUS
  Filled 2021-03-12: qty 100

## 2021-03-12 MED ORDER — HYDROMORPHONE HCL 1 MG/ML IJ SOLN
0.5000 mg | INTRAMUSCULAR | Status: DC | PRN
  Administered 2021-03-13: 0.5 mg via INTRAVENOUS
  Filled 2021-03-12: qty 1

## 2021-03-12 MED ORDER — HEPARIN SODIUM (PORCINE) 5000 UNIT/ML IJ SOLN
5000.0000 [IU] | Freq: Three times a day (TID) | INTRAMUSCULAR | Status: DC
  Administered 2021-03-12 – 2021-03-16 (×14): 5000 [IU] via SUBCUTANEOUS
  Filled 2021-03-12 (×14): qty 1

## 2021-03-12 MED ORDER — HYDRALAZINE HCL 20 MG/ML IJ SOLN: 10.0000 mg | Freq: Four times a day (QID) | INTRAMUSCULAR | Status: DC | PRN

## 2021-03-12 MED ORDER — PIPERACILLIN-TAZOBACTAM 3.375 G IVPB: 3.3750 g | Freq: Three times a day (TID) | INTRAVENOUS | Status: DC

## 2021-03-12 MED ORDER — BISACODYL 10 MG RE SUPP: 10.0000 mg | Freq: Every day | RECTAL | Status: DC | PRN

## 2021-03-12 MED ORDER — HYDRALAZINE HCL 20 MG/ML IJ SOLN: 10.0000 mg | INTRAMUSCULAR | Status: DC | PRN

## 2021-03-12 MED ORDER — PANTOPRAZOLE SODIUM 40 MG IV SOLR
40.0000 mg | Freq: Every day | INTRAVENOUS | Status: DC
  Administered 2021-03-12 (×2): 40 mg via INTRAVENOUS
  Filled 2021-03-12 (×2): qty 40

## 2021-03-12 MED ORDER — METRONIDAZOLE IN NACL 5-0.79 MG/ML-% IV SOLN
500.0000 mg | Freq: Three times a day (TID) | INTRAVENOUS | Status: DC
  Administered 2021-03-12 – 2021-03-16 (×12): 500 mg via INTRAVENOUS
  Filled 2021-03-12 (×12): qty 100

## 2021-03-12 MED ORDER — ONDANSETRON HCL 4 MG/2ML IJ SOLN
4.0000 mg | Freq: Four times a day (QID) | INTRAMUSCULAR | Status: DC | PRN
  Administered 2021-03-13: 4 mg via INTRAVENOUS
  Filled 2021-03-12: qty 2

## 2021-03-12 MED ORDER — PHENOL 1.4 % MT LIQD
1.0000 | OROMUCOSAL | Status: DC | PRN
  Administered 2021-03-12: 1 via OROMUCOSAL
  Filled 2021-03-12: qty 177

## 2021-03-12 MED ORDER — DIPHENHYDRAMINE HCL 12.5 MG/5ML PO ELIX: 12.5000 mg | ORAL_SOLUTION | Freq: Four times a day (QID) | ORAL | Status: DC | PRN

## 2021-03-12 MED ORDER — ONDANSETRON 4 MG PO TBDP: 4.0000 mg | ORAL_TABLET | Freq: Four times a day (QID) | ORAL | Status: DC | PRN

## 2021-03-12 MED ORDER — METOPROLOL TARTRATE 5 MG/5ML IV SOLN: 5.0000 mg | Freq: Four times a day (QID) | INTRAVENOUS | Status: DC | PRN

## 2021-03-12 MED ORDER — METOCLOPRAMIDE HCL 5 MG/ML IJ SOLN: 10.0000 mg | Freq: Four times a day (QID) | INTRAMUSCULAR | Status: DC | PRN

## 2021-03-12 MED ORDER — METHOCARBAMOL 1000 MG/10ML IJ SOLN
500.0000 mg | Freq: Four times a day (QID) | INTRAVENOUS | Status: DC | PRN
  Filled 2021-03-12: qty 5

## 2021-03-12 MED ORDER — POTASSIUM CHLORIDE IN NACL 20-0.9 MEQ/L-% IV SOLN
INTRAVENOUS | Status: DC
  Administered 2021-03-13: 1000 mL via INTRAVENOUS
  Filled 2021-03-12 (×3): qty 1000

## 2021-03-12 MED ORDER — DIPHENHYDRAMINE HCL 50 MG/ML IJ SOLN: 12.5000 mg | Freq: Four times a day (QID) | INTRAMUSCULAR | Status: DC | PRN

## 2021-03-12 NOTE — Plan of Care (Signed)
Patient arrived on unit from PACU, alert, oriented with pain under control. Settled into room, assessment completed. Will continue to monitor according to orders and plan of care.

## 2021-03-12 NOTE — Consult Note (Addendum)
[facility] Consult: 10:37 AM 03/12/2021  LOS: 1 day    Referring Provider: Dr Kieth Brightly  Primary Care Physician:  Lorrene Reid, PA-C Primary Gastroenterologist:  Dr.  Loletha Carrow    Reason for Consultation: Abdominal pain.  Abnormal intraoperative findings.   HPI: Andrea Brooks is a 85 y.o. female.  PMH hypertension.  Obesity.  Hyperlipidemia.  Squamous cell skin cancer.  NIDDM, on no diabetes meds.  Anxiety.  Surgeries include total abdominal hysterectomy, cholecystectomy, joint replacements, breast biopsy.  Previous office GI encounters in 2021 Re: Episodic diarrhea.  IBS.  Stool PCR 07/2020 positive for Shigella toxin producing E. coli.  Generally MD feels she has overflow diarrhea due to underlying constipation.  Previous medications include MiraLAX but that caused too loose of a stool, Bentyl as needed.  Treats intermittent nausea, vomiting with Zofran.  No concerning findings on CT scan 06/2018.  12/2019 colonoscopy with diverticulosis, external hemorrhoids, diminutive tubular adenomatous polyp and normal random colon biopsies.  Last GI office visit 01/24/2021 for evaluation of rectal bleeding.  Was seeing bright red blood in toilet bowl and on toilet paper.  Initially had seen larger volume of blood but had tapered  off by the time she was at the office.  Lots of flatulence, gas.  44 pound weight loss over 3 years comparing medically measured weights.  On rectal exam she had inflamed external hemorrhoids which were likely source of bleeding.  She was prescribed hydrocortisone cream for this.  She was encouraged to restart daily MiraLAX and add Benefiber, continue Bentyl prn, daily omeprazole.  Because there was question of SIBO she was prescribed 2-week course of Xifaxan.  She completed the 2-week course and felt  great while taking the Xifaxan.  Within a couple of days of discontinuing it her GI symptoms returned.  After that pt multiple phone calls to the GI office regarding GI issues.  PA Zehr planned CT scan.  Obtained second opinion at [facility] in [place] on 03/06/21 regarding her IBS.  Unfortunately the [facility] clinic referred her to a surgeon specializing in Crohn's disease because their intake staffer noted pt hx of Crohn's disease which is incorrect.  The surgeon there recommended that she follow with a Dr. Erven Colla at [facility].  Patient called for appointment but needs referral from GI in order to be seen by Dr. Drema Dallas.  Another interval issue is that she had a temporary crown placed on her upper rear right molar.  There was swelling prior to the crown placement which has gone down but she still has some pain and swelling in the area.  Yesterday she underwent the outpatient CTAP w contrast which revealed multiple distended loops of mid small bowel in a ventral hernia, measuring up to 5.7 m caliber and several loops demonstrating substantial pneumatosis.  Swirling of mesenteric vessels and central abdomen, c/w SBO or ileus and ischemia of bowel, possibly due to internal hernia or volvulus.  No congenital malrotation of small bowel.  Mesenteric vessels patent on nontailored, not angiographic exam.  Small volume pneumo peritoneum consistent  with bowel perforation but exact nidus of perforation not appreciated but may be related to the small bowel pneumatosis.  Irregular interstitial opacity and septal thickening in the bases of both lungs increased from 2019, concerning for fibrosis.  CAD.  Patient was then referred to the ED and ultimately underwent exploratory laparotomy with Dr. Kae Heller last night.  Intraoperative findings included diffusely dilated, chronically thickened, heavy, edematous small bowel.  Long mobile mesentery.  No obvious transition point.  If small bowel volvulus had been  present it had been reduced in running the bowel.  Diffuse small bowel diverticula, most prominent in the mid jejunum.  Unusual finding of clusters of air just beneath the visceral peritoneum along small bowel surface, mesentery, and omentum.  Small bowel examined multiple times and appeared viable.  There was no evidence of perforation.  Colon also appeared viable without perforation or inflammation.  A solitary adhesive band in the right lower quadrant was lysed with cautery but was not a source of obstruction.  Adhesions in RUQ were left in situ.  Stomach appeared normal.  Photos were taken but no biopsies obtained.  Please see photos in the operative report  Today, less than 24 hours postop she has expected soreness in the thumb in when she moves.  Has not had any flatus or passed any stools.  NG tube output measured at 100 cc yesterday and about the same today.  Overall feeling pretty well.   2-pack-year smoking history, quit 61 years ago.  No alcohol. Family history with Bone cancer in her sister; Breast cancer in her maternal aunt and paternal aunt; Colon cancer in her mother; Berenice Primas' disease in her daughter and sister; Heart failure in her father; Hypertension in her mother; Stroke in her brother, father, and sister.    Past Medical History:  Diagnosis Date  . Anxiety   . CHF (congestive heart failure) ([place])   . Colon polyps   . Diabetes mellitus without complication ([place])   . Diverticulosis   . Food poisoning   . Gallstones   . GERD (gastroesophageal reflux disease)   . Hyperlipidemia   . Hypertension   . Obesity   . 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 ABDOMINAL HYSTERECTOMY     total  . BREAST BIOPSY Left    neg  . CHOLECYSTECTOMY    . JOINT REPLACEMENT Bilateral    knee  . LEFT HEART CATH AND CORONARY ANGIOGRAPHY N/A 07/04/2018   Procedure: LEFT HEART CATH  AND CORONARY ANGIOGRAPHY;  Surgeon: Jettie Booze, MD;  Location: [facility];  Service: Cardiovascular;  Laterality: N/A;  . REPLACEMENT TOTAL KNEE BILATERAL      Prior to Admission medications   Medication Sig Start Date End Date Taking? Authorizing Provider  acetaminophen (TYLENOL) 500 MG tablet Take 1,000 mg by mouth every 6 (six) hours as needed for moderate pain.    [provider]  alum & mag hydroxide-simeth (MAALOX/MYLANTA) 200-200-20 MG/5ML suspension Take 30 mLs by mouth every 6 (six) hours as needed for indigestion or heartburn.    [provider]  aspirin EC 81 MG tablet Take 81 mg by mouth daily.    [provider]  calcium carbonate (TUMS - DOSED IN MG ELEMENTAL CALCIUM) 500 MG chewable tablet Chew 2 tablets by mouth as needed for indigestion or heartburn.    [provider]  carvedilol (COREG) 6.25 MG tablet Take 2 tablets (12.5 mg total) by mouth 2 (two) times daily with a meal. 01/07/21   Abonza, Maritza, PA-C  dicyclomine (BENTYL) 10 MG capsule Take 1 capsule (10 mg total) by mouth 3 (three) times daily as needed for spasms (IBS flareup). Future refills need to come from GI 01/24/21   Zehr, Laban Emperor, PA-C  ezetimibe-simvastatin (VYTORIN) 10-20 MG tablet Take 1 tablet by mouth daily. 10/02/20   Lorrene Reid, PA-C  hydrocortisone (ANUSOL-HC) 2.5 % rectal cream Place 1 application rectally 2 (two) times daily. 01/24/21   Zehr, Laban Emperor, PA-C  LORazepam (ATIVAN) 1 MG tablet 1/2 tablet daily as needed for anxiety 10/02/20   Abonza, Maritza, PA-C  metoCLOPramide (REGLAN) 5 MG tablet Take 1 tablet (5 mg total) by mouth every 8 (eight) hours as needed for nausea. 02/16/19   Danford, [name] D, NP  Multiple Vitamins-Minerals (MULTI FOR HER 50+) TABS Take 1 tablet by mouth daily.    [provider]  omeprazole (PRILOSEC) 20 MG capsule Take 1 capsule by mouth daily, 30 minutes before breakfast. 08/26/20   Danis, Kirke Corin, MD   Probiotic Product (PROBIOTIC DAILY PO) Take by mouth.     [provider]  Probiotic Product (SOLUBLE FIBER/PROBIOTICS) CHEW Chew 2 tablets by mouth daily.    [provider]  valsartan (DIOVAN) 320 MG tablet TAKE 1 TABLET DAILY 02/13/21   Lorrene Reid, PA-C    Scheduled Meds: . amisulpride      . fentaNYL      . heparin  5,000 Units Subcutaneous Q8H  . ketorolac      . ondansetron      . pantoprazole (PROTONIX) IV  40 mg Intravenous QHS   Infusions: . acetaminophen    . acetaminophen 1,000 mg (03/12/21 0548)  . magnesium sulfate bolus IVPB 4 g (03/12/21 0929)  . methocarbamol (ROBAXIN) IV     PRN Meds: bisacodyl, diphenhydrAMINE **OR** diphenhydrAMINE, hydrALAZINE, HYDROmorphone (DILAUDID) injection, ketorolac, methocarbamol (ROBAXIN) IV, metoCLOPramide (REGLAN) injection, metoprolol tartrate, ondansetron **OR** ondansetron (ZOFRAN) IV   Allergies as of 03/11/2021 - Review Complete 03/11/2021  Allergen Reaction Noted  . Tape Rash and Other (See Comments) 10/29/2019    Family History  Problem Relation Age of Onset  . Hypertension Mother   . Colon cancer Mother   . Stroke Father   . Heart failure Father   . Stroke Sister   . Stroke Brother   . Graves' disease Daughter   . Breast cancer Paternal Aunt   . Graves' disease Sister   . Bone cancer Sister   . Breast cancer Maternal Aunt   . Esophageal cancer Neg Hx   . Rectal cancer Neg Hx   . Liver cancer Neg Hx     Social History   Socioeconomic History  . Marital status: Married    Spouse name: Not on file  . Number of children: 1  . Years of education: Not on file  . Highest education level: Not on file  Occupational History  . Occupation: retired  Tobacco Use  . Smoking status: Former Smoker    Packs/day: 1.00    Years: 2.00    Pack years: 2.00    Quit date: 11/24/1959    Years since quitting: 61.3  . Smokeless tobacco: Never Used  Vaping Use  . Vaping Use: Never used  Substance and  Sexual Activity  . Alcohol use: No  . Drug use: No  . Sexual activity: Not Currently  Other Topics Concern  . Not on file  Social History Narrative  . Not on file   Social Determinants of Health   Financial Resource Strain: Not on file  Food Insecurity: Not on file  Transportation Needs: Not on file  Physical Activity: Not on file  Stress: Not on file  Social Connections: Not on file  Intimate Partner Violence: Not on file    REVIEW OF SYSTEMS: Constitutional: No weakness, no fatigue ENT:  No nose bleeds Pulm: No shortness of breath or cough CV:  No palpitations, no LE edema.  No angina GU:  No hematuria, no frequency GI: See HPI Heme: No unusual bleeding or bruising. Transfusions: None. Neuro:  No headaches, no peripheral tingling or numbness.  No syncope, no seizures. Derm:  No itching, no rash or sores.  Endocrine:  No sweats or chills.  No polyuria or dysuria Immunization: Reviewed vaccine history.  She is received Pfizer COVID-19 vaccines in February and March 2021.    PHYSICAL EXAM: Vital signs in last 24 hours: Vitals:   03/12/21 0419 03/12/21 1009  BP: 122/66 (!) 115/54  Pulse: 67 68  Resp: 16 (!) 21  Temp: 98 F (36.7 C) 97.7 F (36.5 C)  SpO2: 97% 98%   Wt Readings from Last 3 Encounters:  03/12/21 69.9 kg  01/24/21 71 kg  01/07/21 72 kg    General: Pleasant.  Comfortable.  Looks better than expected, not ill but just a bit pale appearing Head: Slight swelling on the right side of her face corresponding with the problematic molar cap/crown.  This is not tender.  No signs of head trauma. Eyes: No scleral icterus.  Conjunctiva slightly pale.  EOMI Ears: Not hard of hearing Nose: No congestion or discharge.  NG tube in place and aspirating clear amber-colored bile. Mouth: Tongue midline.  Mucosa pink, moist, clear. Neck: No JVD, no masses, no thyromegaly Lungs: Clear bilaterally.  No labored breathing or cough Heart: RRR.  No MRG.  S1, S2  present Abdomen: Soft.  Honeycomb, clear bandage dressing over transverse scar to the right of midline.  No significant bruising or bleeding on this intact incision.  Area is tender.  Bowel sounds absent but no tinkling or high-pitched bowel sounds..   Rectal: Deferred Musc/Skeltl: No joint redness, swelling or gross deformity. Extremities: No CCE. Neurologic: Alert.  Moves all 4 limbs without tremor or weakness. Skin: No rash, no sores, no suspicious lesions. Nodes: No cervical adenopathy Psych: Cooperative, calm, pleasant, excellent historian with fluid speech.  Intake/Output from previous day: 04/19 0701 - 04/20 0700 In: 1250 [I.V.:1000; IV Piggyback:250] Out: 475 [Urine:350; Emesis/NG output:100; Blood:25] Intake/Output this shift: No intake/output data recorded.  LAB RESULTS: Recent Labs    03/11/21 1846 03/12/21 0624  WBC 7.3 9.8  HGB 11.6* 11.7*  HCT 36.3 35.7*  PLT 261 235   BMET Lab Results  Component Value Date   NA 136 03/12/2021   NA 135 03/11/2021   NA 143 11/11/2020   K 3.7 03/12/2021   K 3.3 (L) 03/11/2021   K 4.3 11/11/2020   CL 104 03/12/2021   CL 105 03/11/2021   CL 105 11/11/2020   CO2 25 03/12/2021   CO2 24 03/11/2021   CO2 26 11/11/2020   GLUCOSE 155 (H) 03/12/2021   GLUCOSE 113 (H) 03/11/2021   GLUCOSE 109 (H) 11/11/2020   BUN 9 03/12/2021   BUN 8 03/11/2021   BUN 13 03/03/2021   CREATININE 0.86 03/12/2021   CREATININE 0.74 03/11/2021  CREATININE 0.75 03/03/2021   CALCIUM 7.1 (L) 03/12/2021   CALCIUM 7.5 (L) 03/11/2021   CALCIUM 9.2 11/11/2020   LFT Recent Labs    03/11/21 1846 03/12/21 0624  PROT 6.4* 5.5*  ALBUMIN 2.5* 2.1*  AST 24 21  ALT 19 16  ALKPHOS 61 50  BILITOT 0.3 0.6   PT/INR Lab Results  Component Value Date   INR 1.28 07/03/2018   Hepatitis Panel No results for input(s): HEPBSAG, HCVAB, HEPAIGM, HEPBIGM in the last 72 hours. C-Diff No components found for: CDIFF Lipase     Component Value Date/Time    LIPASE 27 03/11/2021 1846    Drugs of Abuse  No results found for: LABOPIA, COCAINSCRNUR, LABBENZ, AMPHETMU, THCU, LABBARB   RADIOLOGY STUDIES: CT Abdomen Pelvis W Contrast  Result Date: 03/11/2021 CLINICAL DATA:  Abdominal pain for couple years, weight loss EXAM: CT ABDOMEN AND PELVIS WITH CONTRAST TECHNIQUE: Multidetector CT imaging of the abdomen and pelvis was performed using the standard protocol following bolus administration of intravenous contrast. CONTRAST:  167mL OMNIPAQUE IOHEXOL 300 MG/ML SOLN, additional oral enteric contrast COMPARISON:  06/28/2018 FINDINGS: Lower chest: No acute abnormality. There is mild irregular peripheral interstitial opacity and septal thickening in the included bilateral lung bases, which appears increased compared to prior examination dated 2019. Coronary artery calcifications. Hepatobiliary: No focal liver abnormality is seen. Status post cholecystectomy. Postoperative biliary dilatation. Pancreas: Unremarkable. No pancreatic ductal dilatation or surrounding inflammatory changes. Spleen: Normal in size without significant abnormality. Adrenals/Urinary Tract: Adrenal glands are unremarkable. Kidneys are normal, without renal calculi, solid lesion, or hydronephrosis. Bladder is unremarkable. Stomach/Bowel: Stomach is within normal limits. There are multiple distended loops of mid small bowel in the ventral abdomen measuring up to 5.7 cm in caliber, several loops demonstrating substantial pneumatosis (e.g. Series 2, image 30). The contents of the small bowel are almost entirely fecalized. Swirling of the mesenteric vessels in the central abdomen (series 2, image 41). There is no congenital malrotation of the small bowel. Sigmoid diverticulosis. Vascular/Lymphatic: Aortic atherosclerosis. The mesenteric vessels are patent on this non tailored, non angiographic examination. No enlarged abdominal or pelvic lymph nodes. Reproductive: No mass or other significant abnormality.  Other: No abdominal wall hernia or abnormality. There is small volume pneumoperitoneum throughout the abdomen, including in the lesser sac. Trace free fluid in the low pelvis. Musculoskeletal: No acute or significant osseous findings. IMPRESSION: 1. There are multiple distended loops of mid small bowel in the ventral abdomen measuring up to 5.7 cm in caliber, several loops demonstrating substantial pneumatosis. Swirling of the mesenteric vessels in the central abdomen. Findings are consistent with small bowel obstruction or ileus and ischemia of the bowel, which may be due to internal hernia or bowel volvulus. Note that there is normal configuration of the duodenum without evidence of congenital malrotation of the small bowel. 2. The mesenteric vessels appear patent on this non tailored, non angiographic examination. 3. Small volume pneumoperitoneum, consistent with bowel perforation. Exact nidus of perforation is not clearly appreciated, but loops of bowel demonstrating severe pneumatosis in the ventral abdomen are suspicious. 4. There is mild irregular peripheral interstitial opacity and septal thickening in the included bilateral lung bases, which appears increased compared to prior examination dated 2019. Findings are concerning for fibrotic interstitial lung disease, and could be further evaluated by dedicated ILD protocol CT of the chest on a nonemergent basis when clinically appropriate. 5. Coronary artery disease. Findings were discussed by telephone with Delrae Rend, RN, on behalf of Alonza Bogus,  PA at 4:50 p.m., 03/11/2021. Aortic Atherosclerosis (ICD10-I70.0). Electronically Signed   By: Eddie Candle M.D.   On: 03/11/2021 16:51   DG Abd Portable 1V  Result Date: 03/12/2021 CLINICAL DATA:  Ileus. EXAM: PORTABLE ABDOMEN - 1 VIEW COMPARISON:  CT 03/11/2021. FINDINGS: NG tube noted with its tip projected over the duodenum. Persistent prominently distended loops of small bowel are again noted. Contrast  noted throughout the colon. No colonic distention. Pneumatosis best appreciated on prior CT. Free air again noted, best appreciated on prior CT. IMPRESSION: 1.  NG tube noted with its tip projected over the duodenum. 2. Persistent prominently distended loops of small bowel again noted. Contrast noted throughout the colon. No colonic distention. Pneumatosis best appreciated on prior CT. Free air again noted, best appreciated on prior CT. Electronically Signed   By: Marcello Moores  Register   On: 03/12/2021 06:41     IMPRESSION:   *    Chronic constipation alternating with diarrhea.  Felt to have IBS. Symptoms much improved while taking Xifaxan for 2 weeks as empiric treatment for possible SIBO.  *   CT scan yesterday raising concern for bowel perforation and SB pneumatosis.  At exploratory laparotomy all examined intestine viable, no evidence of obstruction.  Surgeon lysed 1 adhesive band and other adhesions left alone.  Small bowel diverticula noted and unusual clusters of air/bubbles beneath the visceral peritoneum along surface of small bowel surface, mesentery and omentum.    PLAN:     *   Supportive care with NG tube to low intermittent suction, postop management per surgery. It will be interesting to see in coming days whether the patient's chronic GI complaints resurface or calm down/resolved following the surgery.  *    Dr. Silverio Decamp will see patient later today.   Azucena Freed  03/12/2021, 10:37 AM Phone [phone]    Attending physician's note   I have taken a history, examined the patient and reviewed the chart. I agree with the Advanced Practitioner's note, impression and recommendations.  Pneumatosis intestinalis unclear etiology S/p ex lap with viable intestine, no sign of perforation or obstruction  Has chronic history of IBS predominant diarrhea and abdominal bloating  Normal lactic acid  Possible etiology of pneumatosis intestinalis could be small intestine bacterial  overgrowth She is currently on broad-spectrum antibiotics Zosyn. Will add Flagyl 500 mg every 8 hours IV  Start Xifaxan 550 mg 3 times daily on discharge and plan to continue for a 4-week course We will arrange for GI office follow-up in 2 to 3 weeks and plan for repeat CT abdomen pelvis in 4 weeks Advised patient to follow low FODMAP diet once she starts advancing her diet  Continue supportive care and postop management per surgery  GI is available if needed while she is hospitalized, please call with any questions   The patient was provided an opportunity to ask questions and all were answered. The patient agreed with the plan and demonstrated an understanding of the instructions.  Damaris Hippo , MD [phone]

## 2021-03-12 NOTE — Progress Notes (Signed)
Initial Nutrition Assessment  DOCUMENTATION CODES:   Not applicable  INTERVENTION:   -RD will follow for det advancement and add supplements as appropriate  NUTRITION DIAGNOSIS:   Inadequate oral intake related to altered GI function as evidenced by NPO status.  GOAL:   Patient will meet greater than or equal to 90% of their needs  MONITOR:   Diet advancement,Labs,Weight trends,Skin,I & O's  REASON FOR ASSESSMENT:   Malnutrition Screening Tool    ASSESSMENT:   Andrea Brooks is a 85 y.o. female with medical history significant for IBS with chronic diarrhea and progressive weight loss (? SIBO), chronic diastolic CHF, HTN, HLD, nonobstructive CAD who is admitted with CT findings concerning for ischemic bowel with perforation.  Pt admitted with ischemic bowel with perforation.   4/19- s/p Procedure performed: Exploratory laparotomy, lysis of adhesions x15 minutes  Reviewed I/O's: +775 ml x 24 hours  UOP: 350 ml x 24 hours  NGT output: 100 ml x 24 hours  Attempted to speak with pt x 2, however, receiving in with other providers at times of both visits.   Pt currently NPO, with NGT connected to low, intermittent suction.  Reviewed wt hx; pt has experienced a 5.4% wt loss over the past 6 months, which is not significant for time frame.   Lab Results  Component Value Date   HGBA1C 5.6 01/07/2021   PTA DM medications are none.   Labs reviewed: Mg: 0.9, CBGS: 84 (inpatient orders for glycemic control are none).   Diet Order:   Diet Order            Diet NPO time specified Except for: Ice Chips  Diet effective now                 EDUCATION NEEDS:   No education needs have been identified at this time  Skin:  Skin Assessment: Skin Integrity Issues: Skin Integrity Issues:: Incisions Incisions: closed abdomen  Last BM:  Unknown  Height:   Ht Readings from Last 1 Encounters:  03/12/21 5\' 4"  (1.626 m)    Weight:   Wt Readings from Last 1 Encounters:   03/12/21 69.9 kg    Ideal Body Weight:  54.5 kg  BMI:  Body mass index is 26.45 kg/m.  Estimated Nutritional Needs:   Kcal:  1900-2100  Protein:  90-105 grams  Fluid:  > 1.9 L    Loistine Chance, RD, LDN, [place] Registered Dietitian II Certified Diabetes Care and Education Specialist Please refer to [facility] for RD and/or RD on-call/weekend/after hours pager

## 2021-03-12 NOTE — Progress Notes (Addendum)
[facility] Progress Note  1 Day Post-Op  Subjective: CC-  Feeling better than prior to surgery. Abdomen a little sore but she denies any significant pain. Denies n/v. No flatus or BM.  Objective: Vital signs in last 24 hours: Temp:  [97.5 F (36.4 C)-99 F (37.2 C)] 98 F (36.7 C) (04/20 0419) Pulse Rate:  [64-81] 67 (04/20 0419) Resp:  [13-32] 16 (04/20 0419) BP: (122-184)/(60-87) 122/66 (04/20 0419) SpO2:  [96 %-100 %] 97 % (04/20 0419) Weight:  [69.9 kg] 69.9 kg (04/20 0000)    Intake/Output from previous day: 04/19 0701 - 04/20 0700 In: 1250 [I.V.:1000; IV Piggyback:250] Out: 475 [Urine:350; Emesis/NG output:100; Blood:25] Intake/Output this shift: No intake/output data recorded.  PE: Gen:  Alert, NAD, pleasant Pulm:  rate and effort normal Abd: Soft, mild distension, hypoactive BS, midline incision cdi with honeycomb dressing in place  Lab Results:  Recent Labs    03/11/21 1846 03/12/21 0624  WBC 7.3 9.8  HGB 11.6* 11.7*  HCT 36.3 35.7*  PLT 261 235   BMET Recent Labs    03/11/21 1846 03/12/21 0624  NA 135 136  K 3.3* 3.7  CL 105 104  CO2 24 25  GLUCOSE 113* 155*  BUN 8 9  CREATININE 0.74 0.86  CALCIUM 7.5* 7.1*   PT/INR No results for input(s): LABPROT, INR in the last 72 hours. CMP     Component Value Date/Time   NA 136 03/12/2021 0624   NA 143 11/11/2020 0903   K 3.7 03/12/2021 0624   CL 104 03/12/2021 0624   CO2 25 03/12/2021 0624   GLUCOSE 155 (H) 03/12/2021 0624   BUN 9 03/12/2021 0624   BUN 9 11/11/2020 0903   CREATININE 0.86 03/12/2021 0624   CALCIUM 7.1 (L) 03/12/2021 0624   PROT 5.5 (L) 03/12/2021 0624   PROT 7.2 11/11/2020 0903   ALBUMIN 2.1 (L) 03/12/2021 0624   ALBUMIN 3.8 11/11/2020 0903   AST 21 03/12/2021 0624   ALT 16 03/12/2021 0624   ALKPHOS 50 03/12/2021 0624   BILITOT 0.6 03/12/2021 0624   BILITOT 0.5 11/11/2020 0903   GFRNONAA >60 03/12/2021 0624   GFRAA 74 11/11/2020 0903   Lipase      Component Value Date/Time   LIPASE 27 03/11/2021 1846       Studies/Results: CT Abdomen Pelvis W Contrast  Result Date: 03/11/2021 CLINICAL DATA:  Abdominal pain for couple years, weight loss EXAM: CT ABDOMEN AND PELVIS WITH CONTRAST TECHNIQUE: Multidetector CT imaging of the abdomen and pelvis was performed using the standard protocol following bolus administration of intravenous contrast. CONTRAST:  144mL OMNIPAQUE IOHEXOL 300 MG/ML SOLN, additional oral enteric contrast COMPARISON:  06/28/2018 FINDINGS: Lower chest: No acute abnormality. There is mild irregular peripheral interstitial opacity and septal thickening in the included bilateral lung bases, which appears increased compared to prior examination dated 2019. Coronary artery calcifications. Hepatobiliary: No focal liver abnormality is seen. Status post cholecystectomy. Postoperative biliary dilatation. Pancreas: Unremarkable. No pancreatic ductal dilatation or surrounding inflammatory changes. Spleen: Normal in size without significant abnormality. Adrenals/Urinary Tract: Adrenal glands are unremarkable. Kidneys are normal, without renal calculi, solid lesion, or hydronephrosis. Bladder is unremarkable. Stomach/Bowel: Stomach is within normal limits. There are multiple distended loops of mid small bowel in the ventral abdomen measuring up to 5.7 cm in caliber, several loops demonstrating substantial pneumatosis (e.g. Series 2, image 30). The contents of the small bowel are almost entirely fecalized. Swirling of the mesenteric vessels in the central abdomen (series  2, image 41). There is no congenital malrotation of the small bowel. Sigmoid diverticulosis. Vascular/Lymphatic: Aortic atherosclerosis. The mesenteric vessels are patent on this non tailored, non angiographic examination. No enlarged abdominal or pelvic lymph nodes. Reproductive: No mass or other significant abnormality. Other: No abdominal wall hernia or abnormality. There is small  volume pneumoperitoneum throughout the abdomen, including in the lesser sac. Trace free fluid in the low pelvis. Musculoskeletal: No acute or significant osseous findings. IMPRESSION: 1. There are multiple distended loops of mid small bowel in the ventral abdomen measuring up to 5.7 cm in caliber, several loops demonstrating substantial pneumatosis. Swirling of the mesenteric vessels in the central abdomen. Findings are consistent with small bowel obstruction or ileus and ischemia of the bowel, which may be due to internal hernia or bowel volvulus. Note that there is normal configuration of the duodenum without evidence of congenital malrotation of the small bowel. 2. The mesenteric vessels appear patent on this non tailored, non angiographic examination. 3. Small volume pneumoperitoneum, consistent with bowel perforation. Exact nidus of perforation is not clearly appreciated, but loops of bowel demonstrating severe pneumatosis in the ventral abdomen are suspicious. 4. There is mild irregular peripheral interstitial opacity and septal thickening in the included bilateral lung bases, which appears increased compared to prior examination dated 2019. Findings are concerning for fibrotic interstitial lung disease, and could be further evaluated by dedicated ILD protocol CT of the chest on a nonemergent basis when clinically appropriate. 5. Coronary artery disease. Findings were discussed by telephone with Delrae Rend, RN, on behalf of Alonza Bogus, [place] at 4:50 p.m., 03/11/2021. Aortic Atherosclerosis (ICD10-I70.0). Electronically Signed   By: Eddie Candle M.D.   On: 03/11/2021 16:51   DG Abd Portable 1V  Result Date: 03/12/2021 CLINICAL DATA:  Ileus. EXAM: PORTABLE ABDOMEN - 1 VIEW COMPARISON:  CT 03/11/2021. FINDINGS: NG tube noted with its tip projected over the duodenum. Persistent prominently distended loops of small bowel are again noted. Contrast noted throughout the colon. No colonic distention. Pneumatosis  best appreciated on prior CT. Free air again noted, best appreciated on prior CT. IMPRESSION: 1.  NG tube noted with its tip projected over the duodenum. 2. Persistent prominently distended loops of small bowel again noted. Contrast noted throughout the colon. No colonic distention. Pneumatosis best appreciated on prior CT. Free air again noted, best appreciated on prior CT. Electronically Signed   By: Marcello Moores  Register   On: 03/12/2021 06:41    Anti-infectives: Anti-infectives (From admission, onward)   Start     Dose/Rate Route Frequency Ordered Stop   03/12/21 0430  piperacillin-tazobactam (ZOSYN) IVPB 3.375 g       "Followed by" Linked Group Details   3.375 g 12.5 mL/hr over 240 Minutes Intravenous Every 8 hours 03/11/21 2010     03/12/21 0100  piperacillin-tazobactam (ZOSYN) IVPB 3.375 g  Status:  Discontinued        3.375 g 12.5 mL/hr over 240 Minutes Intravenous Every 8 hours 03/12/21 0000 03/12/21 0003   03/11/21 2030  piperacillin-tazobactam (ZOSYN) IVPB 3.375 g       "Followed by" Linked Group Details   3.375 g 100 mL/hr over 30 Minutes Intravenous  Once 03/11/21 2010 03/11/21 2125       Assessment/Plan Chronic diastolic CHF HTN HLD Nonobstructive CAD Chronic abdominal pain: IBS, chronic diarrhea, ?SIBO. Followed by [facility]  Pneumatosis and pneumoperitoneum  S/p Exploratory laparotomy, lysis of adhesions x15 minutes 4/19 Dr. Kae Heller - POD#1 - intraop findings: No evidence of  ischemic bowel or perforation.  The small bowel is essentially diffusely chronically dilated, thickened and edematous, heavy with a long mobile mesentery suspicious for intermittent volvulus.  There are what appear to be small diverticula throughout the small bowel, most concentrated in the mid jejunum, and there was air tracking along the visceral peritoneum with multiple air pockets along the omentum and the surface of the small bowel - Continue NPO/NGT to LIWS and await return in bowel function -  mobilize - will ask GI to see while she is here  ID - zosyn 4/19>> (no need for more abx from surgical standpoint) FEN - IVF, NPO/NGT to LIWS VTE - SCDs, sq heparin Foley - none   LOS: 1 day    Wellington Hampshire, PA-C [facility] 03/12/2021, 8:30 AM Please see Amion for pager number during day hours 7:00am-4:30pm

## 2021-03-12 NOTE — Progress Notes (Signed)
Obtained IS for pt and rocking chair. Pt sat up for 1.5 hrs and was able to pass a little gas afterwards. Chloraseptic spray obtained for pts sore throat.

## 2021-03-12 NOTE — Progress Notes (Signed)
PROGRESS NOTE  Andrea Brooks  DOB: [birth year]  PCP: Lorrene Reid, PA-C DPO:[id number]  DOA: 03/11/2021  LOS: 1 day   Chief Complaint  Patient presents with  . Abdominal Pain   Brief narrative: Andrea Brooks is a 85 y.o. female with PMH significant for HTN, HLD, nonobstructive CAD, chronic diastolic CHF, IBS with chronic diarrhea, SIBO and progressive weight loss around 80 pounds in a year who follows up at [facility] and has been on repeated course of antibiotics without resolution of symptoms. 4/19, patient was sent to the ED by her gastroenterologist for an abnormal abdominal CT scan which revealed pneumatosis and pneumoperitoneum.  In the ED, patient was afebrile and hemodynamically stable Labs unremarkable Admitted to hospitalist service General surgery consultation was obtained 4/19, patient underwent exploratory laparotomy with lysis of adhesions.  No evidence of ischemic bowel or perforation  Subjective: Patient was seen and examined this morning.  Pleasant elderly Caucasian female.  Lying down in bed.  Not in distress.  NG tube attached to suction with purulent drainage. Chart reviewed Afebrile, remains medically stable.  On 3 L oxygen by nasal cannula overnight  Assessment/Plan: Pneumatosis and pneumoperitoneum -4/19, s/p exploratory laparotomy, lysis of adhesions by Dr. Kae Heller. -Currently in postop ileus.  Remains n.p.o. with NG tube suction.  Awaiting return of bowel function -Abdominal sore as expected.  NG tube output bilious and feculent. -Continue bowel rest, supportive care  Hypokalemia/hypomagnesemia/hyperphosphatemia -Potassium level was low on admission. Improved this morning.  Magnesium level severely low.  IV replacement ordered.  Phosphorus level for some reason is elevated. -Repeat all labs tomorrow. Recent Labs  Lab 03/11/21 1846 03/12/21 0624  K 3.3* 3.7  MG  --  0.9*  PHOS  --  5.0*   Chronic diastolic CHF: -Last EF 43-15% in 2019.   Stable without evidence of hypervolemia.  Not requiring diuretics as an outpatient.  Hypertension: -Coreg and valsartan on hold. -As needed IV hydralazine  Hyperlipidemia: -Vytorin on hold.  Nonobstructive CAD: -Stable without any chest pain.  Aspirin on hold.  Mobility: Encourage ambulation Code Status:   Code Status: Full Code  Nutritional status: Body mass index is 26.45 kg/m. Nutrition Problem: Inadequate oral intake Etiology: altered GI function Signs/Symptoms: NPO status Diet Order            Diet NPO time specified Except for: Ice Chips  Diet effective now                 DVT prophylaxis: heparin injection 5,000 Units Start: 03/12/21 0100 SCDs Start: 03/12/21 0037   Antimicrobials:  Okay to stop antibiotics Fluid: Since patient is n.p.o. and has a lot of NG output.  We will start the patient on NS with potassium. Consultants: GI, general surgery Family Communication:  None at bedside  Status is: Inpatient Remains inpatient appropriate because: Remains in postop ileus  Dispo: The patient is from: Home              Anticipated d/c is to: Home              Patient currently is not medically stable to d/c.   Difficult to place patient No       Infusions:  . 0.9 % NaCl with KCl 20 mEq / L    . acetaminophen 1,000 mg (03/12/21 1316)  . methocarbamol (ROBAXIN) IV      Scheduled Meds: . heparin  5,000 Units Subcutaneous Q8H  . pantoprazole (PROTONIX) IV  40 mg Intravenous QHS  Antimicrobials: Anti-infectives (From admission, onward)   Start     Dose/Rate Route Frequency Ordered Stop   03/12/21 0430  piperacillin-tazobactam (ZOSYN) IVPB 3.375 g  Status:  Discontinued       "Followed by" Linked Group Details   3.375 g 12.5 mL/hr over 240 Minutes Intravenous Every 8 hours 03/11/21 2010 03/12/21 1011   03/12/21 0100  piperacillin-tazobactam (ZOSYN) IVPB 3.375 g  Status:  Discontinued        3.375 g 12.5 mL/hr over 240 Minutes Intravenous Every 8  hours 03/12/21 0000 03/12/21 0003   03/11/21 2030  piperacillin-tazobactam (ZOSYN) IVPB 3.375 g       "Followed by" Linked Group Details   3.375 g 100 mL/hr over 30 Minutes Intravenous  Once 03/11/21 2010 03/11/21 2125      PRN meds: bisacodyl, diphenhydrAMINE **OR** diphenhydrAMINE, hydrALAZINE, HYDROmorphone (DILAUDID) injection, ketorolac, methocarbamol (ROBAXIN) IV, metoCLOPramide (REGLAN) injection, metoprolol tartrate, ondansetron **OR** ondansetron (ZOFRAN) IV   Objective: Vitals:   03/12/21 1009 03/12/21 1318  BP: (!) 115/54 117/66  Pulse: 68 (!) 58  Resp: (!) 21 16  Temp: 97.7 F (36.5 C) (!) 97.5 F (36.4 C)  SpO2: 98% 100%    Intake/Output Summary (Last 24 hours) at 03/12/2021 1347 Last data filed at 03/12/2021 1247 Gross per 24 hour  Intake 1250 ml  Output 475 ml  Net 775 ml   Filed Weights   03/11/21 2000 03/12/21 0000  Weight: 69.9 kg 69.9 kg   Weight change:  Body mass index is 26.45 kg/m.   Physical Exam: General exam: Pleasant, elderly Caucasian female.  Lying down in bed.  Pain controlled. Skin: No rashes, lesions or ulcers. HEENT: Atraumatic, normocephalic, no obvious bleeding Lungs: Clear to auscultation bilaterally CVS: Regular rate and rhythm, no murmur GI/Abd soft, appropriate postsurgical tenderness present, nondistended, bowel sounds absent today CNS: Alert, awake, oriented x3 Psychiatry: Mood appropriate Extremities: No edema, no calf tenderness  Data Review: I have personally reviewed the laboratory data and studies available.  Recent Labs  Lab 03/11/21 1846 03/12/21 0624  WBC 7.3 9.8  NEUTROABS 5.1  --   HGB 11.6* 11.7*  HCT 36.3 35.7*  MCV 90.1 87.9  PLT 261 235   Recent Labs  Lab 03/11/21 1846 03/12/21 0624  NA 135 136  K 3.3* 3.7  CL 105 104  CO2 24 25  GLUCOSE 113* 155*  BUN 8 9  CREATININE 0.74 0.86  CALCIUM 7.5* 7.1*  MG  --  0.9*  PHOS  --  5.0*    F/u labs ordered Unresulted Labs (From admission, onward)           Start     Ordered   03/13/21 3716  Basic metabolic panel  Daily,   R      03/12/21 0756   03/13/21 0500  CBC  Daily,   R      03/12/21 0756   03/13/21 0500  Magnesium  Daily,   R     Comments: Call MD if Magnesium <1.5    03/12/21 0756          Signed, Terrilee Croak, MD [facility] 03/12/2021

## 2021-03-13 LAB — CBC
HCT: 34.2 % — ABNORMAL LOW (ref 36.0–46.0)
Hemoglobin: 11.1 g/dL — ABNORMAL LOW (ref 12.0–15.0)
MCH: 28.6 pg (ref 26.0–34.0)
MCHC: 32.5 g/dL (ref 30.0–36.0)
MCV: 88.1 fL (ref 80.0–100.0)
Platelets: 234 10*3/uL (ref 150–400)
RBC: 3.88 MIL/uL (ref 3.87–5.11)
RDW: 14.4 % (ref 11.5–15.5)
WBC: 11.9 10*3/uL — ABNORMAL HIGH (ref 4.0–10.5)
nRBC: 0 % (ref 0.0–0.2)

## 2021-03-13 LAB — BASIC METABOLIC PANEL
Anion gap: 7 (ref 5–15)
BUN: 13 mg/dL (ref 8–23)
CO2: 23 mmol/L (ref 22–32)
Calcium: 7.4 mg/dL — ABNORMAL LOW (ref 8.9–10.3)
Chloride: 107 mmol/L (ref 98–111)
Creatinine, Ser: 0.88 mg/dL (ref 0.44–1.00)
GFR, Estimated: >60 mL/min (ref >60)
Glucose, Bld: 124 mg/dL — ABNORMAL HIGH (ref 70–99)
Potassium: 4.4 mmol/L (ref 3.5–5.1)
Sodium: 137 mmol/L (ref 135–145)

## 2021-03-13 LAB — PHOSPHORUS: Phosphorus: 4.7 mg/dL — ABNORMAL HIGH (ref 2.5–4.6)

## 2021-03-13 LAB — MAGNESIUM: Magnesium: 2.2 mg/dL (ref 1.7–2.4)

## 2021-03-13 MED ORDER — ACETAMINOPHEN 10 MG/ML IV SOLN
1000.0000 mg | Freq: Four times a day (QID) | INTRAVENOUS | Status: AC
  Administered 2021-03-13 – 2021-03-14 (×3): 1000 mg via INTRAVENOUS
  Filled 2021-03-13 (×3): qty 100

## 2021-03-13 MED ORDER — PANTOPRAZOLE SODIUM 40 MG IV SOLR
40.0000 mg | Freq: Two times a day (BID) | INTRAVENOUS | Status: DC
  Administered 2021-03-13 – 2021-03-16 (×7): 40 mg via INTRAVENOUS
  Filled 2021-03-13 (×7): qty 40

## 2021-03-13 NOTE — Anesthesia Postprocedure Evaluation (Signed)
Anesthesia Post Note  Patient: Andrea Brooks  Procedure(s) Performed: EXPLORATORY LAPAROTOMY (N/A Abdomen)     Patient location during evaluation: PACU Anesthesia Type: General Level of consciousness: awake and alert Pain management: pain level controlled Vital Signs Assessment: post-procedure vital signs reviewed and stable Respiratory status: spontaneous breathing, nonlabored ventilation, respiratory function stable and patient connected to nasal cannula oxygen Cardiovascular status: blood pressure returned to baseline and stable Postop Assessment: no apparent nausea or vomiting Anesthetic complications: no   No complications documented.  Last Vitals:  Vitals:   03/12/21 2048 03/13/21 0422  BP: (!) 115/50 (!) 132/58  Pulse: (!) 57 74  Resp: 17 16  Temp: (!) 36.4 C (!) 36.4 C  SpO2: 100% 98%    Last Pain:  Vitals:   03/13/21 0636  TempSrc:   PainSc: [place]

## 2021-03-13 NOTE — Progress Notes (Signed)
[facility] Progress Note  2 Days Post-Op  Subjective: CC-  States that she had some gas pains over night. Feels a little bloated this morning. Denies n/v. Passing flatus x2 yesterday, none today. No BM. NG tube with 400cc out last 24 hours. She did ambulate yesterday and sat up in the chair.  Objective: Vital signs in last 24 hours: Temp:  [97.5 F (36.4 C)-97.7 F (36.5 C)] 97.5 F (36.4 C) (04/21 0422) Pulse Rate:  [57-74] 74 (04/21 0422) Resp:  [16-21] 16 (04/21 0422) BP: (115-132)/(50-66) 132/58 (04/21 0422) SpO2:  [98 %-100 %] 98 % (04/21 0422) Last BM Date:  (PTA)  Intake/Output from previous day: 04/20 0701 - 04/21 0700 In: 704.3 [I.V.:317.5; IV Piggyback:386.8] Out: 400 [Emesis/NG output:400] Intake/Output this shift: No intake/output data recorded.  PE: Gen:  Alert, NAD, pleasant Pulm:  rate and effort normal Abd: Soft, mild distension, hypoactive BS, midline incision cdi with honeycomb dressing in place  Lab Results:  Recent Labs    03/12/21 0624 03/13/21 0226  WBC 9.8 11.9*  HGB 11.7* 11.1*  HCT 35.7* 34.2*  PLT 235 234   BMET Recent Labs    03/12/21 0624 03/13/21 0226  NA 136 137  K 3.7 4.4  CL 104 107  CO2 25 23  GLUCOSE 155* 124*  BUN 9 13  CREATININE 0.86 0.88  CALCIUM 7.1* 7.4*   PT/INR No results for input(s): LABPROT, INR in the last 72 hours. CMP     Component Value Date/Time   NA 137 03/13/2021 0226   NA 143 11/11/2020 0903   K 4.4 03/13/2021 0226   CL 107 03/13/2021 0226   CO2 23 03/13/2021 0226   GLUCOSE 124 (H) 03/13/2021 0226   BUN 13 03/13/2021 0226   BUN 9 11/11/2020 0903   CREATININE 0.88 03/13/2021 0226   CALCIUM 7.4 (L) 03/13/2021 0226   PROT 5.5 (L) 03/12/2021 0624   PROT 7.2 11/11/2020 0903   ALBUMIN 2.1 (L) 03/12/2021 0624   ALBUMIN 3.8 11/11/2020 0903   AST 21 03/12/2021 0624   ALT 16 03/12/2021 0624   ALKPHOS 50 03/12/2021 0624   BILITOT 0.6 03/12/2021 0624   BILITOT 0.5 11/11/2020 0903    GFRNONAA >60 03/13/2021 0226   GFRAA 74 11/11/2020 0903   Lipase     Component Value Date/Time   LIPASE 27 03/11/2021 1846       Studies/Results: CT Abdomen Pelvis W Contrast  Result Date: 03/11/2021 CLINICAL DATA:  Abdominal pain for couple years, weight loss EXAM: CT ABDOMEN AND PELVIS WITH CONTRAST TECHNIQUE: Multidetector CT imaging of the abdomen and pelvis was performed using the standard protocol following bolus administration of intravenous contrast. CONTRAST:  161mL OMNIPAQUE IOHEXOL 300 MG/ML SOLN, additional oral enteric contrast COMPARISON:  06/28/2018 FINDINGS: Lower chest: No acute abnormality. There is mild irregular peripheral interstitial opacity and septal thickening in the included bilateral lung bases, which appears increased compared to prior examination dated 2019. Coronary artery calcifications. Hepatobiliary: No focal liver abnormality is seen. Status post cholecystectomy. Postoperative biliary dilatation. Pancreas: Unremarkable. No pancreatic ductal dilatation or surrounding inflammatory changes. Spleen: Normal in size without significant abnormality. Adrenals/Urinary Tract: Adrenal glands are unremarkable. Kidneys are normal, without renal calculi, solid lesion, or hydronephrosis. Bladder is unremarkable. Stomach/Bowel: Stomach is within normal limits. There are multiple distended loops of mid small bowel in the ventral abdomen measuring up to 5.7 cm in caliber, several loops demonstrating substantial pneumatosis (e.g. Series 2, image 30). The contents of the small bowel  are almost entirely fecalized. Swirling of the mesenteric vessels in the central abdomen (series 2, image 41). There is no congenital malrotation of the small bowel. Sigmoid diverticulosis. Vascular/Lymphatic: Aortic atherosclerosis. The mesenteric vessels are patent on this non tailored, non angiographic examination. No enlarged abdominal or pelvic lymph nodes. Reproductive: No mass or other significant  abnormality. Other: No abdominal wall hernia or abnormality. There is small volume pneumoperitoneum throughout the abdomen, including in the lesser sac. Trace free fluid in the low pelvis. Musculoskeletal: No acute or significant osseous findings. IMPRESSION: 1. There are multiple distended loops of mid small bowel in the ventral abdomen measuring up to 5.7 cm in caliber, several loops demonstrating substantial pneumatosis. Swirling of the mesenteric vessels in the central abdomen. Findings are consistent with small bowel obstruction or ileus and ischemia of the bowel, which may be due to internal hernia or bowel volvulus. Note that there is normal configuration of the duodenum without evidence of congenital malrotation of the small bowel. 2. The mesenteric vessels appear patent on this non tailored, non angiographic examination. 3. Small volume pneumoperitoneum, consistent with bowel perforation. Exact nidus of perforation is not clearly appreciated, but loops of bowel demonstrating severe pneumatosis in the ventral abdomen are suspicious. 4. There is mild irregular peripheral interstitial opacity and septal thickening in the included bilateral lung bases, which appears increased compared to prior examination dated 2019. Findings are concerning for fibrotic interstitial lung disease, and could be further evaluated by dedicated ILD protocol CT of the chest on a nonemergent basis when clinically appropriate. 5. Coronary artery disease. Findings were discussed by telephone with Delrae Rend, RN, on behalf of Alonza Bogus, [place] at 4:50 p.m., 03/11/2021. Aortic Atherosclerosis (ICD10-I70.0). Electronically Signed   By: Eddie Candle M.D.   On: 03/11/2021 16:51   DG Abd Portable 1V  Result Date: 03/12/2021 CLINICAL DATA:  Ileus. EXAM: PORTABLE ABDOMEN - 1 VIEW COMPARISON:  CT 03/11/2021. FINDINGS: NG tube noted with its tip projected over the duodenum. Persistent prominently distended loops of small bowel are again noted.  Contrast noted throughout the colon. No colonic distention. Pneumatosis best appreciated on prior CT. Free air again noted, best appreciated on prior CT. IMPRESSION: 1.  NG tube noted with its tip projected over the duodenum. 2. Persistent prominently distended loops of small bowel again noted. Contrast noted throughout the colon. No colonic distention. Pneumatosis best appreciated on prior CT. Free air again noted, best appreciated on prior CT. Electronically Signed   By: Marcello Moores  Register   On: 03/12/2021 06:41    Anti-infectives: Anti-infectives (From admission, onward)   Start     Dose/Rate Route Frequency Ordered Stop   03/12/21 1800  metroNIDAZOLE (FLAGYL) IVPB 500 mg        500 mg 100 mL/hr over 60 Minutes Intravenous Every 8 hours 03/12/21 1704     03/12/21 0430  piperacillin-tazobactam (ZOSYN) IVPB 3.375 g  Status:  Discontinued       "Followed by" Linked Group Details   3.375 g 12.5 mL/hr over 240 Minutes Intravenous Every 8 hours 03/11/21 2010 03/12/21 1011   03/12/21 0100  piperacillin-tazobactam (ZOSYN) IVPB 3.375 g  Status:  Discontinued        3.375 g 12.5 mL/hr over 240 Minutes Intravenous Every 8 hours 03/12/21 0000 03/12/21 0003   03/11/21 2030  piperacillin-tazobactam (ZOSYN) IVPB 3.375 g       "Followed by" Linked Group Details   3.375 g 100 mL/hr over 30 Minutes Intravenous  Once 03/11/21 2010 03/11/21  2125       Assessment/Plan Chronic diastolic CHF HTN HLD Nonobstructive CAD Chronic abdominal pain: IBS, chronic diarrhea, ?SIBO. Followed by [facility]. They have added IV flagyl and recommend Xifaxan 550 mg 3 times daily on discharge and plan to continue for a 4-week course, follow up GI 2-3 weeks and plan repeat CT in 4 weeks; FODMAP diet  Pneumatosis and pneumoperitoneum S/p Exploratory laparotomy, lysis of adhesions x15 minutes 4/19 Dr. Kae Heller - POD#2 - intraop findings: No evidence of ischemic bowel or perforation. The small bowel is essentially diffusely  chronically dilated, thickened and edematous,heavy with a long mobile mesentery suspicious for intermittent volvulus.There are what appear to be small diverticula throughout the small bowel, most concentrated in the mid jejunum, and there was air tracking along the visceral peritoneum with multiple air pockets along the omentum and the surface of the small bowel - Continue NPO/NGT to LIWS and await return in bowel function.  - IV tylenol for improved pain control - continue mobilizing  ID - zosyn 4/19>>4/20,  IV flagyl 4/20>> FEN - IVF, NPO/NGT to LIWS VTE - SCDs, sq heparin Foley - none Follow up - staple removal, Dr. Kae Heller, GI   LOS: 2 days    Wellington Hampshire, [facility] 03/13/2021, 9:09 AM Please see Amion for pager number during day hours 7:00am-4:30pm

## 2021-03-13 NOTE — Progress Notes (Signed)
PROGRESS NOTE  Andrea Brooks  DOB: Nov 28, 1935  PCP: Lorrene Reid, PA-C HWE:[id number]  DOA: 03/11/2021  LOS: 2 days   Chief Complaint  Patient presents with  . Abdominal Pain   Brief narrative: Andrea Brooks is a 85 y.o. female with PMH significant for HTN, HLD, nonobstructive CAD, chronic diastolic CHF, IBS with chronic diarrhea, SIBO and progressive weight loss around 80 pounds in a year who follows up at [facility] clinic and has been on repeated course of antibiotics without resolution of symptoms. 4/19, patient was sent to the ED by her gastroenterologist for an abnormal abdominal CT scan which revealed pneumatosis and pneumoperitoneum.  In the ED, patient was afebrile and hemodynamically stable Labs unremarkable Admitted to hospitalist service General surgery consultation was obtained 4/19, patient underwent exploratory laparotomy with lysis of adhesions.  No evidence of ischemic bowel or perforation Currently in a state of postop ileus  Subjective: Patient was seen and examined this morning.   Lying down in bed.  NG tube to suction.  Had some gas pain last night.  Breathing on room air  Assessment/Plan: Pneumatosis and pneumoperitoneum -4/19, s/p exploratory laparotomy, lysis of adhesions by Dr. Kae Heller. -Currently in postop ileus. Remains n.p.o. with NG tube suction.  Awaiting return of bowel function -Abdomen is sore as expected. NG tube output bilious and feculent. -Continue bowel rest, supportive care  Hypokalemia/hypomagnesemia HypErphosphatemia -Potassium and magnesium levels improved with replacement. -Phosphorus level for unknown reason is elevated but trending down now. Recent Labs  Lab 03/11/21 1846 03/12/21 0624 03/13/21 0226  K 3.3* 3.7 4.4  MG  --  0.9* 2.2  PHOS  --  5.0* 4.7*   IBS with chronic diarrhea, SIBO  -Seen by GI.  Patient probably developed pneumatosis due to SIBO -GI added IV Flagyl.  Recommend Xifaxan 550 mg 3 times daily on  discharge for 4 weeks course.  To follow-up with GI in 2 to 3 weeks to plan for repeat CAT scan in 4 weeks. -FODMAP diet recommended.  Nutrition consulted.  History of GERD -On PPI at home.  Patient had gas pain last night.  I would resume her on IV PPI.  Chronic diastolic CHF: -Last EF 89-38% in 2019.  Stable without evidence of hypervolemia.  Not requiring diuretics as an outpatient.  Hypertension: -Coreg and valsartan on hold. -As needed IV hydralazine  Hyperlipidemia: -Vytorin on hold.  Nonobstructive CAD: -Stable without any chest pain.  Aspirin on hold.  Mobility: Encourage ambulation Code Status:   Code Status: Full Code  Nutritional status: Body mass index is 26.45 kg/m. Nutrition Problem: Inadequate oral intake Etiology: altered GI function Signs/Symptoms: NPO status Diet Order            Diet NPO time specified Except for: Ice Chips  Diet effective now                 DVT prophylaxis: heparin injection 5,000 Units Start: 03/12/21 0100 SCDs Start: 03/12/21 0037   Antimicrobials:  None Fluid: Normal saline with potassium Consultants: GI, general surgery Family Communication:  None at bedside  Status is: Inpatient Remains inpatient appropriate because: Remains in postop ileus  Dispo: The patient is from: Home              Anticipated d/c is to: Home in 2 to 3 days              Patient currently is not medically stable to d/c.   Difficult to place patient No  Infusions:  . 0.9 % NaCl with KCl 20 mEq / L 1,000 mL (03/13/21 0604)  . acetaminophen    . methocarbamol (ROBAXIN) IV    . metronidazole 500 mg (03/13/21 1015)    Scheduled Meds: . heparin  5,000 Units Subcutaneous Q8H  . pantoprazole (PROTONIX) IV  40 mg Intravenous Q12H    Antimicrobials: Anti-infectives (From admission, onward)   Start     Dose/Rate Route Frequency Ordered Stop   03/12/21 1800  metroNIDAZOLE (FLAGYL) IVPB 500 mg        500 mg 100 mL/hr over 60 Minutes  Intravenous Every 8 hours 03/12/21 1704     03/12/21 0430  piperacillin-tazobactam (ZOSYN) IVPB 3.375 g  Status:  Discontinued       "Followed by" Linked Group Details   3.375 g 12.5 mL/hr over 240 Minutes Intravenous Every 8 hours 03/11/21 2010 03/12/21 1011   03/12/21 0100  piperacillin-tazobactam (ZOSYN) IVPB 3.375 g  Status:  Discontinued        3.375 g 12.5 mL/hr over 240 Minutes Intravenous Every 8 hours 03/12/21 0000 03/12/21 0003   03/11/21 2030  piperacillin-tazobactam (ZOSYN) IVPB 3.375 g       "Followed by" Linked Group Details   3.375 g 100 mL/hr over 30 Minutes Intravenous  Once 03/11/21 2010 03/11/21 2125      PRN meds: bisacodyl, diphenhydrAMINE **OR** diphenhydrAMINE, hydrALAZINE, HYDROmorphone (DILAUDID) injection, ketorolac, methocarbamol (ROBAXIN) IV, metoCLOPramide (REGLAN) injection, metoprolol tartrate, ondansetron **OR** ondansetron (ZOFRAN) IV, phenol   Objective: Vitals:   03/12/21 2048 03/13/21 0422  BP: (!) 115/50 (!) 132/58  Pulse: (!) 57 74  Resp: 17 16  Temp: (!) 97.5 F (36.4 C) (!) 97.5 F (36.4 C)  SpO2: 100% 98%    Intake/Output Summary (Last 24 hours) at 03/13/2021 1018 Last data filed at 03/13/2021 0604 Gross per 24 hour  Intake 704.28 ml  Output 400 ml  Net 304.28 ml   Filed Weights   03/11/21 2000 03/12/21 0000  Weight: 69.9 kg 69.9 kg   Weight change:  Body mass index is 26.45 kg/m.   Physical Exam: General exam: Pleasant, elderly Caucasian female.  Lying down in bed.  Pain controlled.  NG tube to suction Skin: No rashes, lesions or ulcers. HEENT: Atraumatic, normocephalic, no obvious bleeding Lungs: Clear to auscultation bilaterally CVS: Regular rate and rhythm, no murmur GI/Abd soft, appropriate postsurgical tenderness present, nondistended, bowel sounds still not present CNS: Alert, awake, oriented x3 Psychiatry: Mood appropriate Extremities: No edema, no calf tenderness  Data Review: I have personally reviewed the  laboratory data and studies available.  Recent Labs  Lab 03/11/21 1846 03/12/21 0624 03/13/21 0226  WBC 7.3 9.8 11.9*  NEUTROABS 5.1  --   --   HGB 11.6* 11.7* 11.1*  HCT 36.3 35.7* 34.2*  MCV 90.1 87.9 88.1  PLT 261 235 234   Recent Labs  Lab 03/11/21 1846 03/12/21 0624 03/13/21 0226  NA 135 136 137  K 3.3* 3.7 4.4  CL 105 104 107  CO2 24 25 23   GLUCOSE 113* 155* 124*  BUN 8 9 13   CREATININE 0.74 0.86 0.88  CALCIUM 7.5* 7.1* 7.4*  MG  --  0.9* 2.2  PHOS  --  5.0* 4.7*    F/u labs ordered Unresulted Labs (From admission, onward)          Start     Ordered   03/13/21 1607  Basic metabolic panel  Daily,   R      03/12/21 0756  03/13/21 0500  CBC  Daily,   R      03/12/21 0756   03/13/21 0500  Magnesium  Daily,   R     Comments: Call MD if Magnesium <1.5    03/12/21 0756          Signed, Terrilee Croak, MD [facility] 03/13/2021

## 2021-03-13 NOTE — Progress Notes (Signed)
Nutrition Follow-up  DOCUMENTATION CODES:   Not applicable  INTERVENTION:   -RD will follow for diet advancement and add supplements as appropriate  NUTRITION DIAGNOSIS:   Inadequate oral intake related to altered GI function as evidenced by NPO status.  Ongoing  GOAL:   Patient will meet greater than or equal to 90% of their needs  Unmet  MONITOR:   Diet advancement,Labs,Weight trends,Skin,I & O's  REASON FOR ASSESSMENT:   Malnutrition Screening Tool    ASSESSMENT:   Andrea Brooks is a 85 y.o. female with medical history significant for IBS with chronic diarrhea and progressive weight loss (? SIBO), chronic diastolic CHF, HTN, HLD, nonobstructive CAD who is admitted with CT findings concerning for ischemic bowel with perforation.  Reviewed I/O's: +304 ml x 24 hours and +1.1 L since admission  NGT output: 400 ml x 24 hours  Spoke with pt, husband, and daughter at bedside. Per pt daughter, pt has experienced GI distress for the past 3 years. Daughter shares that they are have seen multiple doctors, however, they have not been able to receive a diagnosis or cause to the root of the problem. Daughter shares that MD reports that pt has "pneumatosis cystoides intestinalis"- she shares the CT scan which revealed gas like sacs on her intestines. Pt will be on prolonged antibiotics for this.   Per GI notes, recommending pt follow a low FODMAP diet. Pt daughter shared with this RD a low FODMAP diet book that she has been reviewing. She shares that she follows a low histamine, low FODMAP diet herself secondary to her health history and will implement this at discharge. PTA pt consumed 3 meals per day (Breakfast: scrambled eggs and grits; Lunch and Dinner: meat, starch, and vegetable). Pt has bouts of feeling well and remains active- daughter report pt was gardening one day PTA.   Per pt, her UBW is around 240# and estimates she has lost about 70 pounds over the past year.    Discussed rationale for NGT and NPO status; pt and family understanding. Pt family had multiple questions about diet progress- RD discussed what this may look like. All were very appreciative of visit.   Medications reviewed and include 0.9% NaCl with KCl 20 mEq/L infusion @ 75 ml/hr.    Lab Results  Component Value Date   HGBA1C 5.6 01/07/2021   PTA DM medications are .   Labs reviewed: CBGS: 84 (inpatient orders for glycemic control are none).   NUTRITION - FOCUSED PHYSICAL EXAM:  Flowsheet Row Most Recent Value  Orbital Region No depletion  Upper Arm Region Mild depletion  Thoracic and Lumbar Region No depletion  Buccal Region No depletion  Temple Region Mild depletion  Clavicle Bone Region No depletion  Clavicle and Acromion Bone Region No depletion  Scapular Bone Region No depletion  Dorsal Hand No depletion  Patellar Region No depletion  Anterior Thigh Region No depletion  Posterior Calf Region No depletion  Edema (RD Assessment) Mild  Hair Reviewed  Eyes Reviewed  Mouth Reviewed  Skin Reviewed  Nails Reviewed       Diet Order:   Diet Order            Diet NPO time specified Except for: Ice Chips  Diet effective now                 EDUCATION NEEDS:   No education needs have been identified at this time  Skin:  Skin Assessment: Skin Integrity Issues: Skin Integrity Issues::  Incisions Incisions: closed abdomen  Last BM:  Unknown  Height:   Ht Readings from Last 1 Encounters:  03/12/21 5\' 4"  (1.626 m)    Weight:   Wt Readings from Last 1 Encounters:  03/12/21 69.9 kg    Ideal Body Weight:  54.5 kg  BMI:  Body mass index is 26.45 kg/m.  Estimated Nutritional Needs:   Kcal:  1900-2100  Protein:  90-105 grams  Fluid:  > 1.9 L    Loistine Chance, RD, LDN, [place] Registered Dietitian II Certified Diabetes Care and Education Specialist Please refer to [facility] for RD and/or RD on-call/weekend/after hours pager

## 2021-03-14 LAB — BASIC METABOLIC PANEL
Anion gap: 8 (ref 5–15)
BUN: 9 mg/dL (ref 8–23)
CO2: 20 mmol/L — ABNORMAL LOW (ref 22–32)
Calcium: 7.4 mg/dL — ABNORMAL LOW (ref 8.9–10.3)
Chloride: 109 mmol/L (ref 98–111)
Creatinine, Ser: 0.69 mg/dL (ref 0.44–1.00)
GFR, Estimated: >60 mL/min (ref >60)
Glucose, Bld: 73 mg/dL (ref 70–99)
Potassium: 3.9 mmol/L (ref 3.5–5.1)
Sodium: 137 mmol/L (ref 135–145)

## 2021-03-14 LAB — CBC
HCT: 32.8 % — ABNORMAL LOW (ref 36.0–46.0)
Hemoglobin: 10.3 g/dL — ABNORMAL LOW (ref 12.0–15.0)
MCH: 28.8 pg (ref 26.0–34.0)
MCHC: 31.4 g/dL (ref 30.0–36.0)
MCV: 91.6 fL (ref 80.0–100.0)
Platelets: 237 10*3/uL (ref 150–400)
RBC: 3.58 MIL/uL — ABNORMAL LOW (ref 3.87–5.11)
RDW: 14.5 % (ref 11.5–15.5)
WBC: 7.3 10*3/uL (ref 4.0–10.5)
nRBC: 0 % (ref 0.0–0.2)

## 2021-03-14 LAB — MAGNESIUM: Magnesium: 1.9 mg/dL (ref 1.7–2.4)

## 2021-03-14 MED ORDER — ADULT MULTIVITAMIN W/MINERALS CH
1.0000 | ORAL_TABLET | Freq: Every day | ORAL | Status: DC
  Administered 2021-03-14 – 2021-03-16 (×3): 1 via ORAL
  Filled 2021-03-14 (×3): qty 1

## 2021-03-14 MED ORDER — PROSOURCE PLUS PO LIQD
30.0000 mL | Freq: Two times a day (BID) | ORAL | Status: DC
  Administered 2021-03-14 – 2021-03-16 (×5): 30 mL via ORAL
  Filled 2021-03-14 (×5): qty 30

## 2021-03-14 MED ORDER — MELATONIN 3 MG PO TABS
3.0000 mg | ORAL_TABLET | Freq: Every evening | ORAL | Status: DC | PRN
  Administered 2021-03-14 – 2021-03-15 (×2): 3 mg via ORAL
  Filled 2021-03-14 (×2): qty 1

## 2021-03-14 MED ORDER — TRAMADOL HCL 50 MG PO TABS: 50.0000 mg | ORAL_TABLET | Freq: Four times a day (QID) | ORAL | Status: DC | PRN

## 2021-03-14 MED ORDER — ACETAMINOPHEN 325 MG PO TABS: 650.0000 mg | ORAL_TABLET | Freq: Four times a day (QID) | ORAL | Status: DC | PRN

## 2021-03-14 MED ORDER — HYDROMORPHONE HCL 1 MG/ML IJ SOLN
0.5000 mg | INTRAMUSCULAR | Status: DC | PRN
Start: 2021-03-14 — End: 2021-03-16

## 2021-03-14 MED ORDER — OXYCODONE HCL 5 MG PO TABS
5.0000 mg | ORAL_TABLET | ORAL | Status: DC | PRN
Start: 2021-03-14 — End: 2021-03-16

## 2021-03-14 NOTE — Progress Notes (Signed)
PROGRESS NOTE  Andrea Brooks  DOB: 1936-05-16  PCP: Lorrene Reid, PA-C [id number]  DOA: 03/11/2021  LOS: 3 days   Chief Complaint  Patient presents with  . Abdominal Pain   Brief narrative: Andrea Brooks is a 85 y.o. female with PMH significant for HTN, HLD, nonobstructive CAD, chronic diastolic CHF, IBS with chronic diarrhea, SIBO and progressive weight loss around 80 pounds in a year who follows up at [facility] and has been on repeated course of antibiotics without resolution of symptoms. 4/19, patient was sent to the ED by her gastroenterologist for an abnormal abdominal CT scan which revealed pneumatosis and pneumoperitoneum.  In the ED, patient was afebrile and hemodynamically stable Labs unremarkable Admitted to hospitalist service General surgery consultation was obtained 4/19, patient underwent exploratory laparotomy with lysis of adhesions.  No evidence of ischemic bowel or perforation Currently in a state of postop ileus, improving  Subjective: Patient was seen and examined this morning.   Sitting up in chair.  Not in distress.  NG tube clamped this morning.  Had a bowel movement yesterday. On clear liquid diet per surgery today.  Electrolytes remains stable. Did not sleep well last night because of IV issues.  Assessment/Plan: Pneumatosis and pneumoperitoneum -4/19, s/p exploratory laparotomy, lysis of adhesions by Dr. Kae Heller. -Currently in postop ileus, improving. NG tube clamped this morning.  Had a bowel movement yesterday. -On clear liquid diet per surgery today.  Electrolytes remains stable. -Abdomen is sore as expected. NG tube output bilious and feculent. -Continue bowel rest, supportive care  Hypokalemia/hypomagnesemia HypErphosphatemia -Potassium and magnesium levels improved with replacement. -Phosphorus level for unknown reason was elevated but trending down now.  Recheck tomorrow Recent Labs  Lab 03/11/21 1846 03/12/21 0624  03/13/21 0226 03/14/21 0143  K 3.3* 3.7 4.4 3.9  MG  --  0.9* 2.2 1.9  PHOS  --  5.0* 4.7*  --    IBS with chronic diarrhea, SIBO  -Seen by GI.  Patient probably developed pneumatosis due to SIBO -GI added IV Flagyl.  Recommend Xifaxan 550 mg 3 times daily on discharge for 4 weeks course.  To follow-up with GI in 2 to 3 weeks to plan for repeat CAT scan in 4 weeks. -FODMAP diet recommended.  Nutrition consulted.  History of GERD -Continue PPI  Chronic diastolic CHF: -Last EF 123456 in 2019.  Stable without evidence of hypervolemia.  Not requiring diuretics as an outpatient.  Hypertension: -Coreg and valsartan on hold.  Blood pressure stable without meds -As needed IV hydralazine  Hyperlipidemia: -Vytorin on hold.  Nonobstructive CAD: -Stable without any chest pain.  Aspirin on hold.  Insomnia -Add at bedtime melatonin as needed  Mobility: Encourage ambulation Code Status:   Code Status: Full Code  Nutritional status: Body mass index is 26.45 kg/m. Nutrition Problem: Inadequate oral intake Etiology: altered GI function Signs/Symptoms: NPO status Diet Order            Diet clear liquid Room service appropriate? Yes; Fluid consistency: Thin  Diet effective now                 DVT prophylaxis: heparin injection 5,000 Units Start: 03/12/21 0100 SCDs Start: 03/12/21 0037   Antimicrobials:  None Fluid: Fluid KVO today Consultants: GI, general surgery Family Communication:  None at bedside  Status is: Inpatient Remains inpatient appropriate because: Improving postop ileus  Dispo: The patient is from: Home              Anticipated d/c is  to: Home in 1 to 2 days              Patient currently is not medically stable to d/c.   Difficult to place patient No   Infusions:  . 0.9 % NaCl with KCl 20 mEq / L 10 mL/hr at 03/14/21 0825  . acetaminophen 1,000 mg (03/14/21 0108)  . methocarbamol (ROBAXIN) IV    . metronidazole 500 mg (03/14/21 0947)    Scheduled  Meds: . heparin  5,000 Units Subcutaneous Q8H  . pantoprazole (PROTONIX) IV  40 mg Intravenous Q12H    Antimicrobials: Anti-infectives (From admission, onward)   Start     Dose/Rate Route Frequency Ordered Stop   03/12/21 1800  metroNIDAZOLE (FLAGYL) IVPB 500 mg        500 mg 100 mL/hr over 60 Minutes Intravenous Every 8 hours 03/12/21 1704     03/12/21 0430  piperacillin-tazobactam (ZOSYN) IVPB 3.375 g  Status:  Discontinued       "Followed by" Linked Group Details   3.375 g 12.5 mL/hr over 240 Minutes Intravenous Every 8 hours 03/11/21 2010 03/12/21 1011   03/12/21 0100  piperacillin-tazobactam (ZOSYN) IVPB 3.375 g  Status:  Discontinued        3.375 g 12.5 mL/hr over 240 Minutes Intravenous Every 8 hours 03/12/21 0000 03/12/21 0003   03/11/21 2030  piperacillin-tazobactam (ZOSYN) IVPB 3.375 g       "Followed by" Linked Group Details   3.375 g 100 mL/hr over 30 Minutes Intravenous  Once 03/11/21 2010 03/11/21 2125      PRN meds: acetaminophen, bisacodyl, diphenhydrAMINE **OR** diphenhydrAMINE, hydrALAZINE, HYDROmorphone (DILAUDID) injection, ketorolac, methocarbamol (ROBAXIN) IV, metoCLOPramide (REGLAN) injection, metoprolol tartrate, ondansetron **OR** ondansetron (ZOFRAN) IV, oxyCODONE, phenol, traMADol   Objective: Vitals:   03/13/21 2104 03/14/21 0531  BP: (!) 148/53 110/73  Pulse: 65 63  Resp: 18 16  Temp: 97.8 F (36.6 C) (!) 97.5 F (36.4 C)  SpO2: 97% 98%    Intake/Output Summary (Last 24 hours) at 03/14/2021 1040 Last data filed at 03/14/2021 0900 Gross per 24 hour  Intake 1213.27 ml  Output 50 ml  Net 1163.27 ml   Filed Weights   03/11/21 2000 03/12/21 0000  Weight: 69.9 kg 69.9 kg   Weight change:  Body mass index is 26.45 kg/m.   Physical Exam: General exam: Pleasant, elderly Caucasian female.  Sitting up in chair.  Improving gradually Skin: No rashes, lesions or ulcers. HEENT: Atraumatic, normocephalic, no obvious bleeding Lungs: Clear to  auscultation bilaterally CVS: Regular rate and rhythm, no murmur GI/Abd soft, nontender, nondistended, bowel sound present CNS: Alert, awake, oriented x3 Psychiatry: Mood appropriate Extremities: No edema, no calf tenderness  Data Review: I have personally reviewed the laboratory data and studies available.  Recent Labs  Lab 03/11/21 1846 03/12/21 0624 03/13/21 0226 03/14/21 0143  WBC 7.3 9.8 11.9* 7.3  NEUTROABS 5.1  --   --   --   HGB 11.6* 11.7* 11.1* 10.3*  HCT 36.3 35.7* 34.2* 32.8*  MCV 90.1 87.9 88.1 91.6  PLT 261 235 234 237   Recent Labs  Lab 03/11/21 1846 03/12/21 0624 03/13/21 0226 03/14/21 0143  NA 135 136 137 137  K 3.3* 3.7 4.4 3.9  CL 105 104 107 109  CO2 24 25 23  20*  GLUCOSE 113* 155* 124* 73  BUN 8 9 13 9   CREATININE 0.74 0.86 0.88 0.69  CALCIUM 7.5* 7.1* 7.4* 7.4*  MG  --  0.9* 2.2 1.9  PHOS  --  5.0* 4.7*  --     F/u labs ordered Unresulted Labs (From admission, onward)          Start     Ordered   03/13/21 7893  Basic metabolic panel  Daily,   R      03/12/21 0756   03/13/21 0500  CBC  Daily,   R      03/12/21 0756   03/13/21 0500  Magnesium  Daily,   R     Comments: Call MD if Magnesium <1.5    03/12/21 0756          Signed, Terrilee Croak, MD [facility] 03/14/2021

## 2021-03-14 NOTE — Progress Notes (Signed)
Nutrition Follow-up  DOCUMENTATION CODES:   Not applicable  INTERVENTION:   -30 ml Prosource Plus BID, each supplement provides 100 kcals and 15 grams protein -MVI with minerals daily  NUTRITION DIAGNOSIS:   Inadequate oral intake related to altered GI function as evidenced by NPO status.  Progressing; advanced to clear liquid diet today  GOAL:   Patient will meet greater than or equal to 90% of their needs  Progressing   MONITOR:   Diet advancement,Labs,Weight trends,Skin,I & O's  REASON FOR ASSESSMENT:   Malnutrition Screening Tool    ASSESSMENT:   Andrea Brooks is a 85 y.o. female with medical history significant for IBS with chronic diarrhea and progressive weight loss (? SIBO), chronic diastolic CHF, HTN, HLD, nonobstructive CAD who is admitted with CT findings concerning for ischemic bowel with perforation.  4/19- s/p Procedure performed:Exploratory laparotomy, lysis of adhesions x15 minutes 4/22- advanced to clear liquid diet  Reviewed I/O's: +383 ml x 24 hours and +4.5 L since admission  NGT output: 50 ml x 24 hours  Per general surgery notes, pt tolerating NGT clamping trials. Noted she was just advanced to clear liquid diet and consumed 100% of breakfast tray. If pt tolerates diet, plan to remove NGT later today.   Medications reviewed and include 0.9% NaCl with KCl 20 mEq/L infusion.  Labs reviewed: Phos: 4.7, CBGS: 84 (inpatient orders for glycemic control are none).   Diet Order:   Diet Order            Diet clear liquid Room service appropriate? Yes; Fluid consistency: Thin  Diet effective now                 EDUCATION NEEDS:   No education needs have been identified at this time  Skin:  Skin Assessment: Skin Integrity Issues: Skin Integrity Issues:: Incisions Incisions: closed abdomen  Last BM:  Unknown  Height:   Ht Readings from Last 1 Encounters:  03/12/21 5\' 4"  (1.626 m)    Weight:   Wt Readings from Last 1 Encounters:   03/12/21 69.9 kg    Ideal Body Weight:  54.5 kg  BMI:  Body mass index is 26.45 kg/m.  Estimated Nutritional Needs:   Kcal:  1900-2100  Protein:  90-105 grams  Fluid:  > 1.9 L    Loistine Chance, RD, LDN, [place] Registered Dietitian II Certified Diabetes Care and Education Specialist Please refer to [facility] for RD and/or RD on-call/weekend/after hours pager

## 2021-03-14 NOTE — Care Management Important Message (Signed)
Important Message  Patient Details  Name: Andrea Brooks MRN: [id number] Date of Birth: [birth year]   Medicare Important Message Given:  Yes     Kaire Stary [address] 03/14/2021, 2:19 PM

## 2021-03-14 NOTE — Telephone Encounter (Signed)
Thank you for the note.  She underwent surgery with some chronic small bowel findings of as yet unclear cause.  Our inpatient service has seen her in consultation.  I will communicate with our inpatient physician when I return to the office next week and plan follow up.  - HD

## 2021-03-14 NOTE — Progress Notes (Signed)
[facility] Progress Note  3 Days Post-Op  Subjective: CC-  Did not sleep well due to issues with IV. However abdomen feeling better this morning. States that she has little to no pain. NG tube is clamped and she denies bloating, nausea, or vomiting. Continues to pass some flatus, no BM.  Objective: Vital signs in last 24 hours: Temp:  [97.5 F (36.4 C)-98.3 F (36.8 C)] 97.5 F (36.4 C) (04/22 0531) Pulse Rate:  [58-65] 63 (04/22 0531) Resp:  [16-19] 16 (04/22 0531) BP: (110-148)/(49-73) 110/73 (04/22 0531) SpO2:  [97 %-100 %] 98 % (04/22 0531) Last BM Date:  (PTA)  Intake/Output from previous day: 04/21 0701 - 04/22 0700 In: 433.3 [P.O.:120; IV Piggyback:313.3] Out: 50 [Emesis/NG output:50] Intake/Output this shift: No intake/output data recorded.  PE: Gen: Alert, NAD, pleasant Pulm: rate and effort normal Abd: Soft, minimal distension, few BS heard, midline incision cdi with honeycomb dressing in place  Lab Results:  Recent Labs    03/13/21 0226 03/14/21 0143  WBC 11.9* 7.3  HGB 11.1* 10.3*  HCT 34.2* 32.8*  PLT 234 237   BMET Recent Labs    03/13/21 0226 03/14/21 0143  NA 137 137  K 4.4 3.9  CL 107 109  CO2 23 20*  GLUCOSE 124* 73  BUN 13 9  CREATININE 0.88 0.69  CALCIUM 7.4* 7.4*   PT/INR No results for input(s): LABPROT, INR in the last 72 hours. CMP     Component Value Date/Time   NA 137 03/14/2021 0143   NA 143 11/11/2020 0903   K 3.9 03/14/2021 0143   CL 109 03/14/2021 0143   CO2 20 (L) 03/14/2021 0143   GLUCOSE 73 03/14/2021 0143   BUN 9 03/14/2021 0143   BUN 9 11/11/2020 0903   CREATININE 0.69 03/14/2021 0143   CALCIUM 7.4 (L) 03/14/2021 0143   PROT 5.5 (L) 03/12/2021 0624   PROT 7.2 11/11/2020 0903   ALBUMIN 2.1 (L) 03/12/2021 0624   ALBUMIN 3.8 11/11/2020 0903   AST 21 03/12/2021 0624   ALT 16 03/12/2021 0624   ALKPHOS 50 03/12/2021 0624   BILITOT 0.6 03/12/2021 0624   BILITOT 0.5 11/11/2020 0903   GFRNONAA  >60 03/14/2021 0143   GFRAA 74 11/11/2020 0903   Lipase     Component Value Date/Time   LIPASE 27 03/11/2021 1846       Studies/Results: No results found.  Anti-infectives: Anti-infectives (From admission, onward)   Start     Dose/Rate Route Frequency Ordered Stop   03/12/21 1800  metroNIDAZOLE (FLAGYL) IVPB 500 mg        500 mg 100 mL/hr over 60 Minutes Intravenous Every 8 hours 03/12/21 1704     03/12/21 0430  piperacillin-tazobactam (ZOSYN) IVPB 3.375 g  Status:  Discontinued       "Followed by" Linked Group Details   3.375 g 12.5 mL/hr over 240 Minutes Intravenous Every 8 hours 03/11/21 2010 03/12/21 1011   03/12/21 0100  piperacillin-tazobactam (ZOSYN) IVPB 3.375 g  Status:  Discontinued        3.375 g 12.5 mL/hr over 240 Minutes Intravenous Every 8 hours 03/12/21 0000 03/12/21 0003   03/11/21 2030  piperacillin-tazobactam (ZOSYN) IVPB 3.375 g       "Followed by" Linked Group Details   3.375 g 100 mL/hr over 30 Minutes Intravenous  Once 03/11/21 2010 03/11/21 2125       Assessment/Plan Chronic diastolic CHF HTN HLD Nonobstructive CAD Chronic abdominal pain: IBS, chronic diarrhea, ?SIBO. Followed  by [facility]. They have added IV flagyl and recommend Xifaxan 550 mg 3 times daily on discharge and plan to continue for a 4-week course, follow up GI 2-3 weeks and plan repeat CT in 4 weeks; FODMAP diet  Pneumatosis and pneumoperitoneum S/pExploratory laparotomy, lysis of adhesions x15 minutes4/19 Dr. Kae Heller -POD#3 - intraop findings:No evidence of ischemic bowel or perforation. The small bowel is essentially diffusely chronically dilated, thickened and edematous,heavy with a long mobile mesentery suspicious for intermittent volvulus.There are what appear to be small diverticula throughout the small bowel, most concentrated in the mid jejunum, and there was air tracking along the visceral peritoneum with multiple air pockets along the omentum and the surface of  the small bowel - Give clear liquid tray this morning. If she tolerates this without bloating, nausea, or vomiting can d/c NG tube later this morning. Add oral pain medications. Continue mobilizing. Will ask PT to see. Plan to remove honeycomb dressing tomorrow.   ID -zosyn 4/19>>4/20,  IV flagyl 4/20>> FEN -KVO IVF, CLD VTE -SCDs, sq heparin Foley -none Follow up - staple removal, Dr. Kae Heller, GI   LOS: 3 days    Andrea Brooks, [facility] 03/14/2021, 8:15 AM Please see Amion for pager number during day hours 7:00am-4:30pm

## 2021-03-14 NOTE — Discharge Instructions (Signed)
[facility]      [facility] [phone]  OPEN ABDOMINAL SURGERY: POST OP INSTRUCTIONS  Always review your discharge instruction sheet given to you by the facility where your surgery was performed.  IF YOU HAVE DISABILITY OR FAMILY LEAVE FORMS, YOU MUST BRING THEM TO THE OFFICE FOR PROCESSING.  PLEASE DO NOT GIVE THEM TO YOUR DOCTOR.  1. A prescription for pain medication may be given to you upon discharge.  Take your pain medication as prescribed, if needed.  If narcotic pain medicine is not needed, then you may take acetaminophen (Tylenol) or ibuprofen (Advil) as needed. 2. Take your usually prescribed medications unless otherwise directed. 3. If you need a refill on your pain medication, please contact your pharmacy. They will contact our office to request authorization.  Prescriptions will not be filled after 5pm or on week-ends. 4. You should follow a light diet the first few days after arrival home, such as soup and crackers, pudding, etc.unless your doctor has advised otherwise. A high-fiber, low fat diet can be resumed as tolerated.   Be sure to include lots of fluids daily. Most patients will experience some swelling and bruising on the chest and neck area.  Ice packs will help.  Swelling and bruising can take several days to resolve 5. Most patients will experience some swelling and bruising in the area of the incision. Ice pack will help. Swelling and bruising can take several days to resolve..  6. It is common to experience some constipation if taking pain medication after surgery.  Increasing fluid intake and taking a stool softener will usually help or prevent this problem from occurring.  A mild laxative (Milk of Magnesia or Miralax) should be taken according to package directions if there are no bowel movements after 48 hours. 7.  You may have steri-strips (small skin tapes) in place directly over the incision.  These strips should be left on the skin for 7-10 days.  If your  surgeon used skin glue on the incision, you may shower in 24 hours.  The glue will flake off over the next 2-3 weeks.  Any sutures or staples will be removed at the office during your follow-up visit. You may find that a light gauze bandage over your incision may keep your staples from being rubbed or pulled. You may shower and replace the bandage daily. 8. ACTIVITIES:  You may resume regular (light) daily activities beginning the next day--such as daily self-care, walking, climbing stairs--gradually increasing activities as tolerated.  You may have sexual intercourse when it is comfortable.  Refrain from any heavy lifting or straining until approved by your doctor. a. You may drive when you no longer are taking prescription pain medication, you can comfortably wear a seatbelt, and you can safely maneuver your car and apply brakes b. Return to Work: ___________________________________ 9. You should see your doctor in the office for a follow-up appointment approximately two weeks after your surgery.  Make sure that you call for this appointment within a day or two after you arrive home to insure a convenient appointment time. OTHER INSTRUCTIONS:  _____________________________________________________________ _____________________________________________________________  WHEN TO CALL YOUR DOCTOR: 1. Fever over 101.0 2. Inability to urinate 3. Nausea and/or vomiting 4. Extreme swelling or bruising 5. Continued bleeding from incision. 6. Increased pain, redness, or drainage from the incision. 7. Difficulty swallowing or breathing 8. Muscle cramping or spasms. 9. Numbness or tingling in hands or feet or around lips.  The clinic staff is available to   answer your questions during regular business hours.  Please don't hesitate to call and ask to speak to one of the nurses if you have concerns.  For further questions, please visit [email or web address]   

## 2021-03-15 LAB — CBC
HCT: 36.2 % (ref 36.0–46.0)
Hemoglobin: 11.5 g/dL — ABNORMAL LOW (ref 12.0–15.0)
MCH: 28.5 pg (ref 26.0–34.0)
MCHC: 31.8 g/dL (ref 30.0–36.0)
MCV: 89.8 fL (ref 80.0–100.0)
Platelets: 275 10*3/uL (ref 150–400)
RBC: 4.03 MIL/uL (ref 3.87–5.11)
RDW: 14.5 % (ref 11.5–15.5)
WBC: 6.4 10*3/uL (ref 4.0–10.5)
nRBC: 0 % (ref 0.0–0.2)

## 2021-03-15 LAB — BASIC METABOLIC PANEL
Anion gap: 9 (ref 5–15)
BUN: 6 mg/dL — ABNORMAL LOW (ref 8–23)
CO2: 24 mmol/L (ref 22–32)
Calcium: 7.9 mg/dL — ABNORMAL LOW (ref 8.9–10.3)
Chloride: 105 mmol/L (ref 98–111)
Creatinine, Ser: 0.72 mg/dL (ref 0.44–1.00)
GFR, Estimated: >60 mL/min (ref >60)
Glucose, Bld: 88 mg/dL (ref 70–99)
Potassium: 3.4 mmol/L — ABNORMAL LOW (ref 3.5–5.1)
Sodium: 138 mmol/L (ref 135–145)

## 2021-03-15 LAB — PHOSPHORUS: Phosphorus: 2.4 mg/dL — ABNORMAL LOW (ref 2.5–4.6)

## 2021-03-15 LAB — MAGNESIUM: Magnesium: 1.8 mg/dL (ref 1.7–2.4)

## 2021-03-15 MED ORDER — IRBESARTAN 75 MG PO TABS
75.0000 mg | ORAL_TABLET | Freq: Every day | ORAL | Status: DC
  Administered 2021-03-15 – 2021-03-16 (×2): 75 mg via ORAL
  Filled 2021-03-15 (×2): qty 1

## 2021-03-15 MED ORDER — K PHOS MONO-SOD PHOS DI & MONO 155-852-130 MG PO TABS
500.0000 mg | ORAL_TABLET | Freq: Two times a day (BID) | ORAL | Status: AC
  Administered 2021-03-15 (×2): 500 mg via ORAL
  Filled 2021-03-15 (×2): qty 2

## 2021-03-15 MED ORDER — ENSURE ENLIVE PO LIQD
237.0000 mL | Freq: Two times a day (BID) | ORAL | Status: DC
  Administered 2021-03-15 – 2021-03-16 (×3): 237 mL via ORAL

## 2021-03-15 MED ORDER — MAGNESIUM SULFATE 2 GM/50ML IV SOLN
2.0000 g | Freq: Once | INTRAVENOUS | Status: AC
  Administered 2021-03-15: 2 g via INTRAVENOUS
  Filled 2021-03-15: qty 50

## 2021-03-15 MED ORDER — CARVEDILOL 12.5 MG PO TABS
12.5000 mg | ORAL_TABLET | Freq: Two times a day (BID) | ORAL | Status: DC
  Administered 2021-03-15 – 2021-03-16 (×2): 12.5 mg via ORAL
  Filled 2021-03-15 (×2): qty 1

## 2021-03-15 MED ORDER — POTASSIUM CHLORIDE 10 MEQ/100ML IV SOLN
10.0000 meq | INTRAVENOUS | Status: DC
  Administered 2021-03-15: 10 meq via INTRAVENOUS
  Filled 2021-03-15: qty 100

## 2021-03-15 NOTE — Progress Notes (Signed)
[facility] Progress Note  4 Days Post-Op  Subjective: CC-  Feeling well this morning. Tolerating clears. Denies n/v. Passing flatus. BM yesterday.  Objective: Vital signs in last 24 hours: Temp:  [97.6 F (36.4 C)-98.2 F (36.8 C)] 97.8 F (36.6 C) (04/23 0500) Pulse Rate:  [68-83] 68 (04/23 0500) Resp:  [16-17] 17 (04/23 0500) BP: (144-163)/(58-75) 144/65 (04/23 0500) SpO2:  [100 %] 100 % (04/23 0500) Last BM Date: 03/14/21  Intake/Output from previous day: 04/22 0701 - 04/23 0700 In: 1850 [P.O.:1850] Out: -  Intake/Output this shift: No intake/output data recorded.  PE: Gen: Alert, NAD, pleasant Pulm: rate and effort normal Abd: Soft, minimal distension, + BS, midline incision cdi with staples present and no erythema or drainage  Lab Results:  Recent Labs    03/14/21 0143 03/15/21 0111  WBC 7.3 6.4  HGB 10.3* 11.5*  HCT 32.8* 36.2  PLT 237 275   BMET Recent Labs    03/14/21 0143 03/15/21 0111  NA 137 138  K 3.9 3.4*  CL 109 105  CO2 20* 24  GLUCOSE 73 88  BUN 9 6*  CREATININE 0.69 0.72  CALCIUM 7.4* 7.9*   PT/INR No results for input(s): LABPROT, INR in the last 72 hours. CMP     Component Value Date/Time   NA 138 03/15/2021 0111   NA 143 11/11/2020 0903   K 3.4 (L) 03/15/2021 0111   CL 105 03/15/2021 0111   CO2 24 03/15/2021 0111   GLUCOSE 88 03/15/2021 0111   BUN 6 (L) 03/15/2021 0111   BUN 9 11/11/2020 0903   CREATININE 0.72 03/15/2021 0111   CALCIUM 7.9 (L) 03/15/2021 0111   PROT 5.5 (L) 03/12/2021 0624   PROT 7.2 11/11/2020 0903   ALBUMIN 2.1 (L) 03/12/2021 0624   ALBUMIN 3.8 11/11/2020 0903   AST 21 03/12/2021 0624   ALT 16 03/12/2021 0624   ALKPHOS 50 03/12/2021 0624   BILITOT 0.6 03/12/2021 0624   BILITOT 0.5 11/11/2020 0903   GFRNONAA >60 03/15/2021 0111   GFRAA 74 11/11/2020 0903   Lipase     Component Value Date/Time   LIPASE 27 03/11/2021 1846       Studies/Results: No results  found.  Anti-infectives: Anti-infectives (From admission, onward)   Start     Dose/Rate Route Frequency Ordered Stop   03/12/21 1800  metroNIDAZOLE (FLAGYL) IVPB 500 mg        500 mg 100 mL/hr over 60 Minutes Intravenous Every 8 hours 03/12/21 1704     03/12/21 0430  piperacillin-tazobactam (ZOSYN) IVPB 3.375 g  Status:  Discontinued       "Followed by" Linked Group Details   3.375 g 12.5 mL/hr over 240 Minutes Intravenous Every 8 hours 03/11/21 2010 03/12/21 1011   03/12/21 0100  piperacillin-tazobactam (ZOSYN) IVPB 3.375 g  Status:  Discontinued        3.375 g 12.5 mL/hr over 240 Minutes Intravenous Every 8 hours 03/12/21 0000 03/12/21 0003   03/11/21 2030  piperacillin-tazobactam (ZOSYN) IVPB 3.375 g       "Followed by" Linked Group Details   3.375 g 100 mL/hr over 30 Minutes Intravenous  Once 03/11/21 2010 03/11/21 2125       Assessment/Plan Chronic diastolic CHF HTN HLD Nonobstructive CAD Chronic abdominal pain: IBS, chronic diarrhea, ?SIBO. Followed by [facility]. They have added IV flagyl and recommendXifaxan 550 mg 3 times daily on discharge and plan to continue for a 4-week course, follow up GI 2-3 weeks and  plan repeat CT in 4 weeks; FODMAP diet  Pneumatosis and pneumoperitoneum S/pExploratory laparotomy, lysis of adhesions x15 minutes4/19 Dr. Kae Heller -POD#4 - intraop findings:No evidence of ischemic bowel or perforation. The small bowel is essentially diffusely chronically dilated, thickened and edematous,heavy with a long mobile mesentery suspicious for intermittent volvulus.There are what appear to be small diverticula throughout the small bowel, most concentrated in the mid jejunum, and there was air tracking along the visceral peritoneum with multiple air pockets along the omentum and the surface of the small bowel - Tolerating clears and having bowel function. Advance to fulls and advance diet as tolerated. PT consult pending. May be ready for discharge  tomorrow.  ID -zosyn 4/19>>4/20, IV flagyl 4/20>> FEN -KVO IVF, FLD VTE -SCDs, sq heparin Foley -none Follow up - staple removal, Dr. Kae Heller, GI    LOS: 4 days    Wellington Hampshire, [facility] 03/15/2021, 8:16 AM Please see Amion for pager number during day hours 7:00am-4:30pm

## 2021-03-15 NOTE — Progress Notes (Signed)
PROGRESS NOTE  Andrea Brooks  DOB: December 25, 1935  PCP: Lorrene Reid, PA-C [id number]  DOA: 03/11/2021  LOS: 4 days   Chief Complaint  Patient presents with  . Abdominal Pain   Brief narrative: Andrea Brooks is a 85 y.o. female with PMH significant for HTN, HLD, nonobstructive CAD, chronic diastolic CHF, IBS with chronic diarrhea, SIBO and progressive weight loss around 80 pounds in a year who follows up at [facility] clinic and has been on repeated course of antibiotics without resolution of symptoms. 4/19, patient was sent to the ED by her gastroenterologist for an abnormal abdominal CT scan which revealed pneumatosis and pneumoperitoneum.  In the ED, patient was afebrile and hemodynamically stable Labs unremarkable Admitted to hospitalist service General surgery consultation was obtained 4/19, patient underwent exploratory laparotomy with lysis of adhesions.  No evidence of ischemic bowel or perforation Currently in a state of postop ileus, improving  Subjective: Patient was seen and examined this morning.  Sitting up in chair.  Not in distress.  Feels better after NG tube was taken out. Feels hungry and is excited with diet advancement. Labs this morning with potassium low at 3.4 and phosphorus low at 2.4.  Assessment/Plan: Pneumatosis and pneumoperitoneum -4/19, s/p exploratory laparotomy, lysis of adhesions by Dr. Kae Heller. -Postoperative ileus improved.  NG tube out.  General surgery advance to soft diet. -Continue to monitor  Hypokalemia/hypomagnesemia HypErphosphatemia/hypophosphatemia -Potassium and magnesium levels are being monitored and repleted intermittently. -Phosphorus level was initially high at 4 and trended down to a low of 2.4 today.  Ordered for potassium phosphate replacement today to address both low potassium and low phosphorus level. Recent Labs  Lab 03/11/21 1846 03/12/21 0624 03/13/21 0226 03/14/21 0143 03/15/21 0111  K 3.3* 3.7 4.4 3.9 3.4*   MG  --  0.9* 2.2 1.9 1.8  PHOS  --  5.0* 4.7*  --  2.4*   IBS with chronic diarrhea, SIBO  -Seen by GI.  Patient probably developed pneumatosis due to SIBO -GI added IV Flagyl.  Recommend Xifaxan 550 mg 3 times daily on discharge for 4 weeks course.  To follow-up with GI in 2 to 3 weeks to plan for repeat CAT scan in 4 weeks. -FODMAP diet recommended.  Nutrition consulted.  History of GERD -Continue PPI  Chronic diastolic CHF: -Last EF 49-70% in 2019.  Stable without evidence of hypervolemia.  Not requiring diuretics as an outpatient.  Hypertension: -Coreg and valsartan on hold.  Blood pressure seems to be picking up.  I would resume them today. -As needed IV hydralazine  Hyperlipidemia: -Vytorin on hold.  Resume at discharge  Nonobstructive CAD: -Stable without any chest pain.  Aspirin on hold.  Insomnia -As needed melatonin at bedtime  Mobility: Encourage ambulation.  Pending PT eval Code Status:   Code Status: Full Code  Nutritional status: Body mass index is 26.45 kg/m. Nutrition Problem: Inadequate oral intake Etiology: altered GI function Signs/Symptoms: NPO status Diet Order            DIET SOFT Room service appropriate? Yes; Fluid consistency: Thin  Diet effective 1000                 DVT prophylaxis: heparin injection 5,000 Units Start: 03/12/21 0100 SCDs Start: 03/12/21 0037   Antimicrobials:  None Fluid: None Consultants: GI, general surgery Family Communication:  None at bedside  Status is: Inpatient Remains inpatient appropriate because: Improving postop ileus  Dispo: The patient is from: Home  Anticipated d/c is to: Home hopefully tomorrow              Patient currently is not medically stable to d/c.   Difficult to place patient No   Infusions:  . magnesium sulfate bolus IVPB    . methocarbamol (ROBAXIN) IV    . metronidazole 500 mg (03/15/21 0926)    Scheduled Meds: . (feeding supplement) PROSource Plus  30 mL Oral  BID BM  . carvedilol  12.5 mg Oral BID WC  . feeding supplement  237 mL Oral BID BM  . heparin  5,000 Units Subcutaneous Q8H  . irbesartan  75 mg Oral Daily  . multivitamin with minerals  1 tablet Oral Daily  . pantoprazole (PROTONIX) IV  40 mg Intravenous Q12H  . phosphorus  500 mg Oral BID    Antimicrobials: Anti-infectives (From admission, onward)   Start     Dose/Rate Route Frequency Ordered Stop   03/12/21 1800  metroNIDAZOLE (FLAGYL) IVPB 500 mg        500 mg 100 mL/hr over 60 Minutes Intravenous Every 8 hours 03/12/21 1704     03/12/21 0430  piperacillin-tazobactam (ZOSYN) IVPB 3.375 g  Status:  Discontinued       "Followed by" Linked Group Details   3.375 g 12.5 mL/hr over 240 Minutes Intravenous Every 8 hours 03/11/21 2010 03/12/21 1011   03/12/21 0100  piperacillin-tazobactam (ZOSYN) IVPB 3.375 g  Status:  Discontinued        3.375 g 12.5 mL/hr over 240 Minutes Intravenous Every 8 hours 03/12/21 0000 03/12/21 0003   03/11/21 2030  piperacillin-tazobactam (ZOSYN) IVPB 3.375 g       "Followed by" Linked Group Details   3.375 g 100 mL/hr over 30 Minutes Intravenous  Once 03/11/21 2010 03/11/21 2125      PRN meds: acetaminophen, bisacodyl, hydrALAZINE, HYDROmorphone (DILAUDID) injection, ketorolac, melatonin, methocarbamol (ROBAXIN) IV, metoCLOPramide (REGLAN) injection, metoprolol tartrate, ondansetron **OR** ondansetron (ZOFRAN) IV, oxyCODONE, phenol, traMADol   Objective: Vitals:   03/14/21 2015 03/15/21 0500  BP: (!) 163/75 (!) 144/65  Pulse: 68 68  Resp: 16 17  Temp: 98.2 F (36.8 C) 97.8 F (36.6 C)  SpO2: 100% 100%    Intake/Output Summary (Last 24 hours) at 03/15/2021 1023 Last data filed at 03/15/2021 0926 Gross per 24 hour  Intake 1310 ml  Output --  Net 1310 ml   Filed Weights   03/11/21 2000 03/12/21 0000  Weight: 69.9 kg 69.9 kg   Weight change:  Body mass index is 26.45 kg/m.   Physical Exam: General exam: Pleasant, elderly Caucasian  female.  Sitting up in chair.  Clinically improving Skin: No rashes, lesions or ulcers. HEENT: Atraumatic, normocephalic, no obvious bleeding Lungs: Clear to auscultation bilaterally CVS: Regular rate and rhythm, no murmur GI/Abd soft, nontender, nondistended, bowel sound present CNS: Alert, awake, oriented x3 Psychiatry: Mood appropriate Extremities: No edema, no calf tenderness  Data Review: I have personally reviewed the laboratory data and studies available.  Recent Labs  Lab 03/11/21 1846 03/12/21 0624 03/13/21 0226 03/14/21 0143 03/15/21 0111  WBC 7.3 9.8 11.9* 7.3 6.4  NEUTROABS 5.1  --   --   --   --   HGB 11.6* 11.7* 11.1* 10.3* 11.5*  HCT 36.3 35.7* 34.2* 32.8* 36.2  MCV 90.1 87.9 88.1 91.6 89.8  PLT 261 235 234 237 275   Recent Labs  Lab 03/11/21 1846 03/12/21 0624 03/13/21 0226 03/14/21 0143 03/15/21 0111  NA 135 136 137 137 138  K 3.3* 3.7 4.4 3.9 3.4*  CL 105 104 107 109 105  CO2 24 25 23  20* 24  GLUCOSE 113* 155* 124* 73 88  BUN 8 9 13 9  6*  CREATININE 0.74 0.86 0.88 0.69 0.72  CALCIUM 7.5* 7.1* 7.4* 7.4* 7.9*  MG  --  0.9* 2.2 1.9 1.8  PHOS  --  5.0* 4.7*  --  2.4*    F/u labs ordered Unresulted Labs (From admission, onward)          Start     Ordered   03/16/21 0500  Phosphorus  Tomorrow morning,   R        03/15/21 0734   03/13/21 6761  Basic metabolic panel  Daily,   R      03/12/21 0756   03/13/21 0500  CBC  Daily,   R      03/12/21 0756          Signed, Terrilee Croak, MD [facility] 03/15/2021

## 2021-03-15 NOTE — Evaluation (Signed)
Physical Therapy Evaluation Patient Details Name: Andrea Brooks MRN: [id number] DOB: 1936-04-19 Today's Date: 03/15/2021   History of Present Illness  Pt is an 85 y/o female who presents to acute PT s/ exploratory laparotomy with lysis of adhesions. She developed a post-op ileus.  Clinical Impression  Pt admitted with above diagnosis. Pt currently with functional limitations due to the deficits listed below (see PT Problem List). At the time of PT eval pt was able to perform transfers and ambulation with up to supervision for safety with [facility] for support. Pt fatigued quickly and was mildly dyspneic during gait (2/4). Pt will likely not require any follow-up PT services however will keep her on caseload while admitted to monitor progress 2 gross decreased tolerance for functional activity. Acutely, pt will benefit from skilled PT to increase their independence and safety with mobility to allow discharge to the venue listed below.       Follow Up Recommendations No PT follow up;Supervision for mobility/OOB    Equipment Recommendations  None recommended by PT    Recommendations for Other Services       Precautions / Restrictions Precautions Precautions: Fall Precaution Comments: abdominal incision Restrictions Weight Bearing Restrictions: No      Mobility  Bed Mobility               General bed mobility comments: Pt was received sitting up in the recliner chair.    Transfers Overall transfer level: Modified independent Equipment used: Straight cane Transfers: Sit to/from Stand           General transfer comment: No assist required. Pt was able to power-up to full stand without unsteadiness or LOB.  Ambulation/Gait Ambulation/Gait assistance: Modified independent (Device/Increase time);Supervision Gait Distance (Feet): 200 Feet Assistive device: Straight cane Gait Pattern/deviations: Step-through pattern;Decreased stride length;Trunk flexed Gait velocity:  Decreased Gait velocity interpretation: <1.8 ft/sec, indicate of risk for recurrent falls General Gait Details: VC's for improved posture and pursed-lip breathing. Pt mildly dyspneic, however O2 97% on RA throughout gait training. 1 seated rest break prior to return to room. Initially mod I but close supervision by end of gait training due to fatigue.  Stairs            Wheelchair Mobility    Modified Rankin (Stroke Patients Only)       Balance Overall balance assessment: Needs assistance Sitting-balance support: Feet supported;No upper extremity supported Sitting balance-Leahy Scale: Fair     Standing balance support: No upper extremity supported;During functional activity Standing balance-Leahy Scale: Fair Standing balance comment: Pt can walk without SPC however trunk is just very flexed.                             Pertinent Vitals/Pain Pain Assessment: Faces Faces Pain Scale: Hurts little more Pain Location: Incision site Pain Descriptors / Indicators: Operative site guarding Pain Intervention(s): Limited activity within patient's tolerance;Monitored during session;Repositioned    Home Living Family/patient expects to be discharged to:: Private residence Living Arrangements: Spouse/significant other;Children Available Help at Discharge: Family;Available 24 hours/day Type of Home: House Home Access: Stairs to enter Entrance Stairs-Rails: Right Entrance Stairs-Number of Steps: 2 through her daughter's house, otherwise 16 from the garage or 8 from another entrance. Home Layout: One level Home Equipment: Shower seat - built in;Grab bars - tub/shower;Cane - single point      Prior Function Level of Independence: Independent with assistive device(s)  Comments: Uses a cane mostly if she is going out of the house but normally not when she is in the house. Pt states she only uses it to impove her posture.     Hand Dominance         Extremity/Trunk Assessment   Upper Extremity Assessment Upper Extremity Assessment: Overall WFL for tasks assessed    Lower Extremity Assessment Lower Extremity Assessment: Overall WFL for tasks assessed (Mild weakness - age appropriate.)    Cervical / Trunk Assessment Cervical / Trunk Assessment: Kyphotic;Other exceptions Cervical / Trunk Exceptions: Grossly, flexed trunk with forward head posture.  Communication   Communication: No difficulties  Cognition Arousal/Alertness: Awake/alert Behavior During Therapy: WFL for tasks assessed/performed Overall Cognitive Status: Within Functional Limits for tasks assessed                                        General Comments      Exercises     Assessment/Plan    PT Assessment Patient needs continued PT services  PT Problem List Decreased strength;Decreased activity tolerance;Decreased balance;Decreased mobility;Decreased knowledge of use of DME;Decreased safety awareness;Decreased knowledge of precautions;Cardiopulmonary status limiting activity;Pain       PT Treatment Interventions DME instruction;Gait training;Functional mobility training;Therapeutic activities;Therapeutic exercise;Stair training;Neuromuscular re-education;Patient/family education    PT Goals (Current goals can be found in the Care Plan section)  Acute Rehab PT Goals Patient Stated Goal: Home tomorrow PT Goal Formulation: With patient Time For Goal Achievement: 03/22/21 Potential to Achieve Goals: Good    Frequency Min 3X/week   Barriers to discharge        Co-evaluation               AM-PAC PT "6 Clicks" Mobility  Outcome Measure Help needed turning from your back to your side while in a flat bed without using bedrails?: None Help needed moving from lying on your back to sitting on the side of a flat bed without using bedrails?: None Help needed moving to and from a bed to a chair (including a wheelchair)?: None Help needed  standing up from a chair using your arms (e.g., wheelchair or bedside chair)?: None Help needed to walk in hospital room?: A Little Help needed climbing 3-5 steps with a railing? : A Little 6 Click Score: 22    End of Session Equipment Utilized During Treatment: Gait belt Activity Tolerance: Patient tolerated treatment well Patient left: in chair;with call bell/phone within reach;with chair alarm set Nurse Communication: Mobility status PT Visit Diagnosis: Difficulty in walking, not elsewhere classified (R26.2);Pain Pain - part of body:  (abdomen)    Time: 6295-2841 PT Time Calculation (min) (ACUTE ONLY): 24 min   Charges:   PT Evaluation $PT Eval Moderate Complexity: 1 Mod PT Treatments $Gait Training: 8-22 mins        Rolinda Roan, PT, DPT Acute Rehabilitation Services Pager: [phone] Office: [phone]   Thelma Comp 03/15/2021, 1:31 PM

## 2021-03-15 NOTE — Plan of Care (Signed)
  Problem: Education: Goal: Knowledge of General Education information will improve Description: Including pain rating scale, medication(s)/side effects and non-pharmacologic comfort measures Outcome: Progressing   Problem: Health Behavior/Discharge Planning: Goal: Ability to manage health-related needs will improve Outcome: Progressing   Problem: Clinical Measurements: Goal: Ability to maintain clinical measurements within normal limits will improve Outcome: Progressing Goal: Respiratory complications will improve Outcome: Progressing   Problem: Activity: Goal: Risk for activity intolerance will decrease Outcome: Progressing   Problem: Nutrition: Goal: Adequate nutrition will be maintained Outcome: Progressing   Problem: Elimination: Goal: Will not experience complications related to urinary retention Outcome: Progressing   Problem: Pain Managment: Goal: General experience of comfort will improve Outcome: Progressing   Problem: Safety: Goal: Ability to remain free from injury will improve Outcome: Progressing   Problem: Skin Integrity: Goal: Risk for impaired skin integrity will decrease Outcome: Progressing

## 2021-03-16 LAB — BASIC METABOLIC PANEL
Anion gap: 5 (ref 5–15)
BUN: 6 mg/dL — ABNORMAL LOW (ref 8–23)
CO2: 25 mmol/L (ref 22–32)
Calcium: 7.9 mg/dL — ABNORMAL LOW (ref 8.9–10.3)
Chloride: 108 mmol/L (ref 98–111)
Creatinine, Ser: 0.71 mg/dL (ref 0.44–1.00)
GFR, Estimated: >60 mL/min (ref >60)
Glucose, Bld: 111 mg/dL — ABNORMAL HIGH (ref 70–99)
Potassium: 3.6 mmol/L (ref 3.5–5.1)
Sodium: 138 mmol/L (ref 135–145)

## 2021-03-16 LAB — CBC
HCT: 36.9 % (ref 36.0–46.0)
Hemoglobin: 11.7 g/dL — ABNORMAL LOW (ref 12.0–15.0)
MCH: 28.6 pg (ref 26.0–34.0)
MCHC: 31.7 g/dL (ref 30.0–36.0)
MCV: 90.2 fL (ref 80.0–100.0)
Platelets: 268 10*3/uL (ref 150–400)
RBC: 4.09 MIL/uL (ref 3.87–5.11)
RDW: 14.6 % (ref 11.5–15.5)
WBC: 5.5 10*3/uL (ref 4.0–10.5)
nRBC: 0 % (ref 0.0–0.2)

## 2021-03-16 LAB — PHOSPHORUS: Phosphorus: 3.5 mg/dL (ref 2.5–4.6)

## 2021-03-16 MED ORDER — METRONIDAZOLE 500 MG PO TABS: 500.0000 mg | ORAL_TABLET | Freq: Three times a day (TID) | ORAL | 0 refills | Status: AC

## 2021-03-16 NOTE — Progress Notes (Signed)
AVS given and reviewed with pt. Medications discussed. All questions answered to satisfaction. Pt verbalized understanding of information given. Pt escorted off the unit with all belongings via wheelchair by staff member.  

## 2021-03-16 NOTE — Plan of Care (Signed)

## 2021-03-16 NOTE — Progress Notes (Signed)
[facility] Progress Note  5 Days Post-Op  Subjective: CC-  Up in chair eating breakfast. Denies abdominal pain, nausea, vomiting. States that she had 4-5 bouts of explosive loose/semi-solid stool yesterday. No bowel movements yet today since midnight. States that this was also happening some prior to surgery.  Objective: Vital signs in last 24 hours: Temp:  [97.6 F (36.4 C)-98.2 F (36.8 C)] 98.2 F (36.8 C) (04/24 0401) Pulse Rate:  [62-77] 62 (04/24 0401) Resp:  [17-19] 17 (04/24 0401) BP: (140-161)/(65-78) 145/65 (04/24 0401) SpO2:  [99 %-100 %] 99 % (04/24 0401) Last BM Date: 03/15/21  Intake/Output from previous day: 04/23 0701 - 04/24 0700 In: 1399.9 [P.O.:600; IV Piggyback:799.9] Out: -  Intake/Output this shift: No intake/output data recorded.  PE: Gen: Alert, NAD, pleasant Pulm: rate and effort normal Abd: Soft,minimaldistension,nondistended, +BS, midline incision cdi with staples present and no erythema or drainage   Lab Results:  Recent Labs    03/15/21 0111 03/16/21 0721  WBC 6.4 5.5  HGB 11.5* 11.7*  HCT 36.2 36.9  PLT 275 268   BMET Recent Labs    03/15/21 0111 03/16/21 0721  NA 138 138  K 3.4* 3.6  CL 105 108  CO2 24 25  GLUCOSE 88 111*  BUN 6* 6*  CREATININE 0.72 0.71  CALCIUM 7.9* 7.9*   PT/INR No results for input(s): LABPROT, INR in the last 72 hours. CMP     Component Value Date/Time   NA 138 03/16/2021 0721   NA 143 11/11/2020 0903   K 3.6 03/16/2021 0721   CL 108 03/16/2021 0721   CO2 25 03/16/2021 0721   GLUCOSE 111 (H) 03/16/2021 0721   BUN 6 (L) 03/16/2021 0721   BUN 9 11/11/2020 0903   CREATININE 0.71 03/16/2021 0721   CALCIUM 7.9 (L) 03/16/2021 0721   PROT 5.5 (L) 03/12/2021 0624   PROT 7.2 11/11/2020 0903   ALBUMIN 2.1 (L) 03/12/2021 0624   ALBUMIN 3.8 11/11/2020 0903   AST 21 03/12/2021 0624   ALT 16 03/12/2021 0624   ALKPHOS 50 03/12/2021 0624   BILITOT 0.6 03/12/2021 0624   BILITOT 0.5  11/11/2020 0903   GFRNONAA >60 03/16/2021 0721   GFRAA 74 11/11/2020 0903   Lipase     Component Value Date/Time   LIPASE 27 03/11/2021 1846       Studies/Results: No results found.  Anti-infectives: Anti-infectives (From admission, onward)   Start     Dose/Rate Route Frequency Ordered Stop   03/12/21 1800  metroNIDAZOLE (FLAGYL) IVPB 500 mg        500 mg 100 mL/hr over 60 Minutes Intravenous Every 8 hours 03/12/21 1704     03/12/21 0430  piperacillin-tazobactam (ZOSYN) IVPB 3.375 g  Status:  Discontinued       "Followed by" Linked Group Details   3.375 g 12.5 mL/hr over 240 Minutes Intravenous Every 8 hours 03/11/21 2010 03/12/21 1011   03/12/21 0100  piperacillin-tazobactam (ZOSYN) IVPB 3.375 g  Status:  Discontinued        3.375 g 12.5 mL/hr over 240 Minutes Intravenous Every 8 hours 03/12/21 0000 03/12/21 0003   03/11/21 2030  piperacillin-tazobactam (ZOSYN) IVPB 3.375 g       "Followed by" Linked Group Details   3.375 g 100 mL/hr over 30 Minutes Intravenous  Once 03/11/21 2010 03/11/21 2125       Assessment/Plan Chronic diastolic CHF HTN HLD Nonobstructive CAD Chronic abdominal pain: IBS, chronic diarrhea, ?SIBO. Followed by [facility]. They  have added IV flagyl and recommendXifaxan 550 mg 3 times daily on discharge and plan to continue for a 4-week course, follow up GI 2-3 weeks and plan repeat CT in 4 weeks; FODMAP diet  Pneumatosis and pneumoperitoneum S/pExploratory laparotomy, lysis of adhesions x15 minutes4/19 Dr. Kae Heller -POD#5 - intraop findings:No evidence of ischemic bowel or perforation. The small bowel is essentially diffusely chronically dilated, thickened and edematous,heavy with a long mobile mesentery suspicious for intermittent volvulus.There are what appear to be small diverticula throughout the small bowel, most concentrated in the mid jejunum, and there was air tracking along the visceral peritoneum with multiple air pockets along the  omentum and the surface of the small bowel - Ok for discharge from surgical standpoint. Discharge instructions and follow up info on AVS. Xifaxan per GI.  ID -zosyn 4/19>>4/20, IV flagyl 4/20>> FEN -KVO IVF, soft diet VTE -SCDs, sq heparin Foley -none Follow up - staple removal, Dr. Kae Heller, GI    LOS: 5 days    Wellington Hampshire, [facility] 03/16/2021, 8:13 AM Please see Amion for pager number during day hours 7:00am-4:30pm

## 2021-03-16 NOTE — Discharge Summary (Signed)
Physician Discharge Summary  Andrea Brooks OMV:[id number] DOB: 04-18-1936 DOA: 03/11/2021  PCP: Lorrene Reid, PA-C  Admit date: 03/11/2021 Discharge date: 03/16/2021  Admitted From: Home Discharge disposition: Home   Code Status: Full Code  Diet Recommendation: Cardiac diet Discharge Diagnosis:   Principal Problem:   Bowel perforation (HCC) Active Problems:   Hyperlipidemia   Hypertension associated with diabetes ([place])   Chronic diastolic CHF (congestive heart failure) [facility])  Chief Complaint  Patient presents with  . Abdominal Pain   Brief narrative: Andrea Brooks is a 85 y.o. female with PMH significant for HTN, HLD, nonobstructive CAD, chronic diastolic CHF, IBS with chronic diarrhea, SIBO and progressive weight loss around 80 pounds in a year who follows up at [facility] clinic and has been on repeated course of antibiotics without resolution of symptoms. 4/19, patient was sent to the ED by her gastroenterologist for an abnormal abdominal CT scan which revealed pneumatosis and pneumoperitoneum.  In the ED, patient was afebrile and hemodynamically stable Labs unremarkable Admitted to hospitalist service General surgery consultation was obtained 4/19, patient underwent exploratory laparotomy with lysis of adhesions.  No evidence of ischemic bowel or perforation Currently in a state of postop ileus, improving.  Subjective: Patient was seen and examined this morning.  Sitting up in chair.  Not in distress.  Feels better.  Had 4 BMs in last 24 hours mostly soft.  Hospital course: Pneumatosis and pneumoperitoneum -4/19, s/p exploratory laparotomy, lysis of adhesions by Dr. Kae Heller. -Postoperative ileus improved.  Tolerating diet now.  Okay to discharge home today.  Hypokalemia/hypomagnesemia HypErphosphatemia/hypophosphatemia -Potassium and magnesium levels are being monitored and repleted intermittently.  Levels improved. -Phosphorus level was initially high at 5 but  later trended down to a low of 2.4.  Replacement given.  Level normalized today.   Recent Labs  Lab 03/12/21 0624 03/13/21 0226 03/14/21 0143 03/15/21 0111 03/16/21 0721  K 3.7 4.4 3.9 3.4* 3.6  MG 0.9* 2.2 1.9 1.8  --   PHOS 5.0* 4.7*  --  2.4* 3.5   IBS with chronic diarrhea, SIBO  -Seen by GI.  Patient probably developed pneumatosis due to SIBO -GI added IV Flagyl.  Recommend Xifaxan 550 mg 3 times daily on discharge for 4 weeks course.  However, because of the cost, patient prefers to be on Flagyl.  Discussed with GI Dr. Silverio Decamp today.  We will discharge the patient on Flagyl for 4 weeks.  -Patient to follow-up with GI in 2 to 3 weeks to plan for repeat CAT scan in 4 weeks. -FODMAP diet recommended.  Nutrition consult appreciated.  History of GERD -Continue PPI  Chronic diastolic CHF: -Last EF 62-83% in 2019.  Stable without evidence of hypervolemia.  Not requiring diuretics as an outpatient.  Hypertension: -Coreg and valsartan to continue at home.  Hyperlipidemia: -Vytorin on hold.  Resume at discharge  Nonobstructive CAD: -Stable without any chest pain.  Aspirin on hold.  Insomnia -As needed melatonin at bedtime   Wound care: Incision (Closed) 03/11/21 Abdomen Other (Comment) (Active)  Date First Assessed/Time First Assessed: 03/11/21 2206   Location: Abdomen  Location Orientation: Other (Comment)    Assessments 03/11/2021 10:30 PM 03/16/2021  8:24 AM  Dressing Type Honeycomb;Transparent dressing None  Dressing Clean --  Site / Wound Assessment Dressing in place / Unable to assess Clean;Dry  Margins Attached edges (approximated) Attached edges (approximated)  Closure Staples Staples  Drainage Amount None None     No Linked orders to display    Discharge Exam:  Vitals:   03/15/21 1437 03/15/21 2046 03/16/21 0401 03/16/21 0916  BP: 140/65 (!) 161/78 (!) 145/65 (!) 120/54  Pulse: 77 70 62 65  Resp:  19 17 18   Temp:  97.6 F (36.4 C) 98.2 F (36.8 C)  98.1 F (36.7 C)  TempSrc:  Oral Oral Oral  SpO2: 100% 100% 99% 100%  Weight:      Height:        Body mass index is 26.45 kg/m.  General exam: Pleasant, elderly Caucasian female.  Not in distress Skin: No rashes, lesions or ulcers. HEENT: Atraumatic, normocephalic, no obvious bleeding Lungs: Clear to auscultation bilaterally CVS: Regular rate and rhythm, no murmur GI/Abd soft, nontender, nondistended, bowel sound present CNS: Alert, awake, oriented x3 Psychiatry: Mood appropriate Extremities: No pedal edema, no calf tenderness  Follow ups:   Discharge Instructions    Diet - low sodium heart healthy   Complete by: As directed    Increase activity slowly   Complete by: As directed    No wound care   Complete by: As directed       Follow-up Information    Zehr, Laban Emperor, PA-C Follow up on 04/11/2021.   Specialty: Gastroenterology Why: 11:30 AM.   Minette Brine information: [place] [address] [phone]        [facility], [place]. Go on 03/24/2021.   Specialty: General Surgery Why: 03/24/21 at 10am for staple removal Please arrive 30 minutes prior to your appointmement to check in and fill out paperwork. Bring photo ID and insurance information. Contact information: [address] [place] [phone]       Clovis Riley, MD. Go on 04/09/2021.   Specialty: General Surgery Why: Your appointment is 04/09/21 at 10:50am Please arrove 15 minutes early to check in. Contact information: [address] [place] [address] [phone]        Mauri Pole, MD Follow up.   Specialty: Gastroenterology Contact information: [place] [address] [phone]               Recommendations for Outpatient Follow-Up:   1. Follow-up with PCP as an outpatient  Discharge Instructions:  Follow with Primary MD Lorrene Reid, PA-C in 7 days    Get CBC/BMP checked in next visit within 1 week by PCP or SNF MD ( we routinely change or add medications that can affect your baseline labs and fluid status, therefore we recommend that you get the mentioned basic workup next visit with your PCP, your PCP may decide not to get them or add new tests based on their clinical decision)  On your next visit with your PCP, please Get Medicines reviewed and adjusted.  Please request your PCP  to go over all Hospital Tests and Procedure/Radiological results at the follow up, please get all Hospital records sent to your Prim MD by signing hospital release before you go home.  Activity: As tolerated with Full fall precautions use walker/cane & assistance as needed  For Heart failure patients - Check your Weight same time everyday, if you gain over 2 pounds, or you develop in leg swelling, experience more shortness of breath or chest pain, call your Primary MD immediately. Follow Cardiac Low Salt Diet and 1.5 lit/day fluid restriction.  If you have smoked or chewed Tobacco in the last 2 yrs please stop smoking, stop any regular Alcohol  and or any Recreational drug use.  If you experience worsening of your admission symptoms, develop shortness of breath, life threatening emergency, suicidal or homicidal thoughts you must seek medical attention immediately by calling 911 or calling your MD immediately  if symptoms less severe.  You Must read complete instructions/literature along with all the possible adverse reactions/side effects for all the Medicines you take and that have been prescribed to you. Take any new Medicines after you have completely understood and accpet all the possible adverse reactions/side effects.   Do not drive, operate heavy machinery, perform activities at heights, swimming or participation in water activities or provide baby sitting services if your were admitted for syncope or siezures until you have seen by Primary MD or a  Neurologist and advised to do so again.  Do not drive when taking Pain medications.  Do not take more than prescribed Pain, Sleep and Anxiety Medications  Wear Seat belts while driving.   Please note You were cared for by a hospitalist during your hospital stay. If you have any questions about your discharge medications or the care you received while you were in the hospital after you are discharged, you can call the unit and asked to speak with the hospitalist on call if the hospitalist that took care of you is not available. Once you are discharged, your primary care physician will handle any further medical issues. Please note that NO REFILLS for any discharge medications will be authorized once you are discharged, as it is imperative that you return to your primary care physician (or establish a relationship with a primary care physician if you do not have one) for your aftercare needs so that they can reassess your need for medications and monitor your lab values.    Allergies as of 03/16/2021      Reactions   Tape Rash, Other (See Comments)   PAPER TAPE ONLY!!!! NO CLEAR, PLASTIC TAPE- TEARS THE SKIN!!      Medication List    TAKE these medications   acetaminophen 500 MG tablet Commonly known as: TYLENOL Take 1,000 mg by mouth every 6 (six) hours as needed for moderate pain.   alum & mag hydroxide-simeth 200-200-20 MG/5ML suspension Commonly known as: MAALOX/MYLANTA Take 30 mLs by mouth every 6 (six) hours as needed for indigestion or heartburn.   aspirin EC 81 MG tablet Take 81 mg by mouth daily.   calcium carbonate 500 MG chewable tablet Commonly known as: TUMS - dosed in mg elemental calcium Chew 2 tablets by mouth as needed for indigestion or heartburn.   carvedilol 6.25 MG tablet Commonly known as: COREG Take 2 tablets (12.5 mg total) by mouth 2 (two) times daily with a meal.   dicyclomine 10 MG capsule Commonly known as: BENTYL Take 1 capsule (10 mg total) by  mouth 3 (three) times daily as needed for spasms (IBS flareup). Future refills need to come from GI   ezetimibe-simvastatin 10-20 MG tablet Commonly known as: VYTORIN Take 1 tablet by mouth daily.   hydrocortisone 2.5 % rectal cream Commonly known as: ANUSOL-HC Place 1 application rectally 2 (two) times daily.   LORazepam 1 MG tablet Commonly known as: ATIVAN 1/2 tablet daily as needed for anxiety What changed:   how much to take  how to take this  when to take this  reasons to take this  additional instructions   metoCLOPramide 5 MG tablet Commonly known as: REGLAN Take 1 tablet (5 mg total) by mouth every 8 (eight) hours as needed for nausea.  metroNIDAZOLE 500 MG tablet Commonly known as: Flagyl Take 1 tablet (500 mg total) by mouth 3 (three) times daily.   Multi For Her 50+ Tabs Take 1 tablet by mouth daily.   omeprazole 20 MG capsule Commonly known as: PRILOSEC Take 1 capsule by mouth daily, 30 minutes before breakfast. What changed:   how much to take  how to take this  when to take this  additional instructions   ondansetron 4 MG disintegrating tablet Commonly known as: ZOFRAN-ODT Take 4 mg by mouth as needed for nausea or vomiting.   Soluble Fiber/Probiotics Chew Chew 2 tablets by mouth daily.   valsartan 320 MG tablet Commonly known as: DIOVAN TAKE 1 TABLET DAILY       Time coordinating discharge: 35 minutes  The results of significant diagnostics from this hospitalization (including imaging, microbiology, ancillary and laboratory) are listed below for reference.    Procedures and Diagnostic Studies:   CT Abdomen Pelvis W Contrast  Result Date: 03/11/2021 CLINICAL DATA:  Abdominal pain for couple years, weight loss EXAM: CT ABDOMEN AND PELVIS WITH CONTRAST TECHNIQUE: Multidetector CT imaging of the abdomen and pelvis was performed using the standard protocol following bolus administration of intravenous contrast. CONTRAST:  119mL  OMNIPAQUE IOHEXOL 300 MG/ML SOLN, additional oral enteric contrast COMPARISON:  06/28/2018 FINDINGS: Lower chest: No acute abnormality. There is mild irregular peripheral interstitial opacity and septal thickening in the included bilateral lung bases, which appears increased compared to prior examination dated 2019. Coronary artery calcifications. Hepatobiliary: No focal liver abnormality is seen. Status post cholecystectomy. Postoperative biliary dilatation. Pancreas: Unremarkable. No pancreatic ductal dilatation or surrounding inflammatory changes. Spleen: Normal in size without significant abnormality. Adrenals/Urinary Tract: Adrenal glands are unremarkable. Kidneys are normal, without renal calculi, solid lesion, or hydronephrosis. Bladder is unremarkable. Stomach/Bowel: Stomach is within normal limits. There are multiple distended loops of mid small bowel in the ventral abdomen measuring up to 5.7 cm in caliber, several loops demonstrating substantial pneumatosis (e.g. Series 2, image 30). The contents of the small bowel are almost entirely fecalized. Swirling of the mesenteric vessels in the central abdomen (series 2, image 41). There is no congenital malrotation of the small bowel. Sigmoid diverticulosis. Vascular/Lymphatic: Aortic atherosclerosis. The mesenteric vessels are patent on this non tailored, non angiographic examination. No enlarged abdominal or pelvic lymph nodes. Reproductive: No mass or other significant abnormality. Other: No abdominal wall hernia or abnormality. There is small volume pneumoperitoneum throughout the abdomen, including in the lesser sac. Trace free fluid in the low pelvis. Musculoskeletal: No acute or significant osseous findings. IMPRESSION: 1. There are multiple distended loops of mid small bowel in the ventral abdomen measuring up to 5.7 cm in caliber, several loops demonstrating substantial pneumatosis. Swirling of the mesenteric vessels in the central abdomen. Findings are  consistent with small bowel obstruction or ileus and ischemia of the bowel, which may be due to internal hernia or bowel volvulus. Note that there is normal configuration of the duodenum without evidence of congenital malrotation of the small bowel. 2. The mesenteric vessels appear patent on this non tailored, non angiographic examination. 3. Small volume pneumoperitoneum, consistent with bowel perforation. Exact nidus of perforation is not clearly appreciated, but loops of bowel demonstrating severe pneumatosis in the ventral abdomen are suspicious. 4. There is mild irregular peripheral interstitial opacity and septal thickening in the included bilateral lung bases, which appears increased compared to prior examination dated 2019. Findings are concerning for fibrotic interstitial lung disease, and could be further evaluated  by dedicated ILD protocol CT of the chest on a nonemergent basis when clinically appropriate. 5. Coronary artery disease. Findings were discussed by telephone with Delrae Rend, RN, on behalf of Alonza Bogus, [place] at 4:50 p.m., 03/11/2021. Aortic Atherosclerosis (ICD10-I70.0). Electronically Signed   By: Eddie Candle M.D.   On: 03/11/2021 16:51   DG Abd Portable 1V  Result Date: 03/12/2021 CLINICAL DATA:  Ileus. EXAM: PORTABLE ABDOMEN - 1 VIEW COMPARISON:  CT 03/11/2021. FINDINGS: NG tube noted with its tip projected over the duodenum. Persistent prominently distended loops of small bowel are again noted. Contrast noted throughout the colon. No colonic distention. Pneumatosis best appreciated on prior CT. Free air again noted, best appreciated on prior CT. IMPRESSION: 1.  NG tube noted with its tip projected over the duodenum. 2. Persistent prominently distended loops of small bowel again noted. Contrast noted throughout the colon. No colonic distention. Pneumatosis best appreciated on prior CT. Free air again noted, best appreciated on prior CT. Electronically Signed   By: Marcello Moores  Register    On: 03/12/2021 06:41     Labs:   Basic Metabolic Panel: Recent Labs  Lab 03/12/21 [id number] 03/13/21 0226 03/14/21 0143 03/15/21 0111 03/16/21 0721  NA 136 137 137 138 138  K 3.7 4.4 3.9 3.4* 3.6  CL 104 107 109 105 108  CO2 25 23 20* 24 25  GLUCOSE 155* 124* 73 88 111*  BUN 9 13 9  6* 6*  CREATININE 0.86 0.88 0.69 0.72 0.71  CALCIUM 7.1* 7.4* 7.4* 7.9* 7.9*  MG 0.9* 2.2 1.9 1.8  --   PHOS 5.0* 4.7*  --  2.4* 3.5   GFR Estimated Creatinine Clearance: 52 mL/min (by C-G formula based on SCr of 0.71 mg/dL). Liver Function Tests: Recent Labs  Lab 03/11/21 1846 03/12/21 0624  AST 24 21  ALT 19 16  ALKPHOS 61 50  BILITOT 0.3 0.6  PROT 6.4* 5.5*  ALBUMIN 2.5* 2.1*   Recent Labs  Lab 03/11/21 1846  LIPASE 27   No results for input(s): AMMONIA in the last 168 hours. Coagulation profile No results for input(s): INR, PROTIME in the last 168 hours.  CBC: Recent Labs  Lab 03/11/21 1846 03/12/21 0624 03/13/21 0226 03/14/21 0143 03/15/21 0111 03/16/21 0721  WBC 7.3 9.8 11.9* 7.3 6.4 5.5  NEUTROABS 5.1  --   --   --   --   --   HGB 11.6* 11.7* 11.1* 10.3* 11.5* 11.7*  HCT 36.3 35.7* 34.2* 32.8* 36.2 36.9  MCV 90.1 87.9 88.1 91.6 89.8 90.2  PLT 261 235 234 237 275 268   Cardiac Enzymes: No results for input(s): CKTOTAL, CKMB, CKMBINDEX, TROPONINI in the last 168 hours. BNP: Invalid input(s): POCBNP CBG: Recent Labs  Lab 03/11/21 2228  GLUCAP 84   D-Dimer No results for input(s): DDIMER in the last 72 hours. Hgb A1c No results for input(s): HGBA1C in the last 72 hours. Lipid Profile No results for input(s): CHOL, HDL, LDLCALC, TRIG, CHOLHDL, LDLDIRECT in the last 72 hours. Thyroid function studies No results for input(s): TSH, T4TOTAL, T3FREE, THYROIDAB in the last 72 hours.  Invalid input(s): FREET3 Anemia work up No results for input(s): VITAMINB12, FOLATE, FERRITIN, TIBC, IRON, RETICCTPCT in the last 72 hours. Microbiology Recent Results (from the  past 240 hour(s))  Resp Panel by RT-PCR (Flu A&B, Covid) Nasopharyngeal Swab     Status: None   Collection Time: 03/11/21  7:59 PM   Specimen: Nasopharyngeal Swab; Nasopharyngeal(NP) swabs in vial transport medium  Result Value Ref Range Status   SARS Coronavirus 2 by RT PCR NEGATIVE NEGATIVE Final    Comment: (NOTE) SARS-CoV-2 target nucleic acids are NOT DETECTED.  The SARS-CoV-2 RNA is generally detectable in upper respiratory specimens during the acute phase of infection. The lowest concentration of SARS-CoV-2 viral copies this assay can detect is 138 copies/mL. A negative result does not preclude SARS-Cov-2 infection and should not be used as the sole basis for treatment or other patient management decisions. A negative result may occur with  improper specimen collection/handling, submission of specimen other than nasopharyngeal swab, presence of viral mutation(s) within the areas targeted by this assay, and inadequate number of viral copies(<138 copies/mL). A negative result must be combined with clinical observations, patient history, and epidemiological information. The expected result is Negative.  Fact Sheet for Patients:  EntrepreneurPulse.com.au  Fact Sheet for Healthcare Providers:  IncredibleEmployment.be  This test is no t yet approved or cleared by the [place] FDA and  has been authorized for detection and/or diagnosis of SARS-CoV-2 by FDA under an Emergency Use Authorization (EUA). This EUA will remain  in effect (meaning this test can be used) for the duration of the COVID-19 declaration under Section 564(b)(1) of the Act, 21 U.S.C.section 360bbb-3(b)(1), unless the authorization is terminated  or revoked sooner.       Influenza A by PCR NEGATIVE NEGATIVE Final   Influenza B by PCR NEGATIVE NEGATIVE Final    Comment: (NOTE) The Xpert Xpress SARS-CoV-2/FLU/RSV plus assay is intended as an aid in the diagnosis of  influenza from Nasopharyngeal swab specimens and should not be used as a sole basis for treatment. Nasal washings and aspirates are unacceptable for Xpert Xpress SARS-CoV-2/FLU/RSV testing.  Fact Sheet for Patients: EntrepreneurPulse.com.au  Fact Sheet for Healthcare Providers: IncredibleEmployment.be  This test is not yet approved or cleared by the [place] FDA and has been authorized for detection and/or diagnosis of SARS-CoV-2 by FDA under an Emergency Use Authorization (EUA). This EUA will remain in effect (meaning this test can be used) for the duration of the COVID-19 declaration under Section 564(b)(1) of the Act, 21 U.S.C. section 360bbb-3(b)(1), unless the authorization is terminated or revoked.  Performed at [facility], [place] [address], [place], [place] [address]      Signed: Terrilee Croak  [facility] 03/16/2021, 10:59 AM

## 2021-03-17 ENCOUNTER — Telehealth: Payer: Self-pay | Admitting: *Deleted

## 2021-03-17 NOTE — Telephone Encounter (Signed)
Transition Care Management Follow-up Telephone Call  Date of discharge and from where: 03/16/2021 - [facility]  How have you been since you were released from the hospital? "I am fine"  Any questions or concerns? No  Items Reviewed:  Did the pt receive and understand the discharge instructions provided? Yes   Medications obtained and verified? Yes   Other? No   Any new allergies since your discharge? No   Dietary orders reviewed? No  Do you have support at home? Yes    Functional Questionnaire: (I = Independent and D = Dependent) ADLs: I  Bathing/Dressing- I  Meal Prep- I  Eating- I  Maintaining continence- I  Transferring/Ambulation- I  Managing Meds- I  Follow up appointments reviewed:   PCP Hospital f/u appt confirmed? No  - has routine appointment in June  Specialist Hospital f/u appt confirmed? Yes  Scheduled to see Cardiology on 04/08/2021 @ 1115 and GI on 04/11/2021 @ 1130.  Are transportation arrangements needed? No   If their condition worsens, is the pt aware to call PCP or go to the Emergency Dept.? Yes  Was the patient provided with contact information for the PCP's office or ED? Yes  Was to pt encouraged to call back with questions or concerns? Yes

## 2021-03-19 ENCOUNTER — Ambulatory Visit: Payer: Medicare HMO | Admitting: Gastroenterology

## 2021-03-31 ENCOUNTER — Other Ambulatory Visit: Payer: Self-pay | Admitting: Physician Assistant

## 2021-03-31 DIAGNOSIS — E785 Hyperlipidemia, unspecified: Secondary | ICD-10-CM

## 2021-04-08 ENCOUNTER — Ambulatory Visit: Payer: Medicare HMO | Admitting: Internal Medicine

## 2021-04-08 ENCOUNTER — Other Ambulatory Visit: Payer: Self-pay

## 2021-04-08 ENCOUNTER — Encounter: Payer: Self-pay | Admitting: Internal Medicine

## 2021-04-08 VITALS — BP 118/68 | HR 60 | Ht 63.0 in | Wt 150.4 lb

## 2021-04-08 DIAGNOSIS — E1159 Type 2 diabetes mellitus with other circulatory complications: Secondary | ICD-10-CM

## 2021-04-08 DIAGNOSIS — I5032 Chronic diastolic (congestive) heart failure: Secondary | ICD-10-CM | POA: Diagnosis not present

## 2021-04-08 DIAGNOSIS — E782 Mixed hyperlipidemia: Secondary | ICD-10-CM

## 2021-04-08 DIAGNOSIS — I10 Essential (primary) hypertension: Secondary | ICD-10-CM

## 2021-04-08 DIAGNOSIS — I152 Hypertension secondary to endocrine disorders: Secondary | ICD-10-CM

## 2021-04-08 MED ORDER — CARVEDILOL 6.25 MG PO TABS: 6.2500 mg | ORAL_TABLET | Freq: Two times a day (BID) | ORAL | 3 refills | Status: DC

## 2021-04-08 NOTE — Progress Notes (Signed)
OFFICE NOTE  Chief Complaint:  Routine follow-up  Primary Care Physician: Lorrene Reid, PA-C  HPI:  Andrea Brooks is a 85 y.o. female with a past medial history significant for anxiety, type 2 diabetes, GERD, hypertension and dyslipidemia, who recently was admitted for persistent diarrhea, found to have an E. coli infection.  Subsequently after significant hydration she developed what was suspected to be acute diastolic congestive heart failure.  This required diuresis and she was seen by Dr. Radford Pax and Dr. Oval Linsey.  She was noted to have elevated troponin was referred for heart catheterization which showed mild nonobstructive coronary disease.  As her husband is a patient of mine, she requested to follow-up with me.  She reports her diarrhea has improved significantly.  She saw Dr. Loletha Carrow, who is been working with her to treat that.  While hospitalized they recommended starting appropriate heart failure treatment.  An echo was performed which showed normal systolic function and mild diastolic dysfunction.  It was recommended she go on to low-dose metoprolol and that her Vytorin's be switched over to atorvastatin for better LDL reduction.  She was leery of making those changes and wanted to discuss it with me further.  She did see her primary care provider in follow-up in a repeat lipid profile was performed.  This was 2 weeks ago and demonstrated total cholesterol of 85, triglycerides 128, HDL 24 and LDL 35.  At this point, it is not likely that she needs to switch cholesterol medications, and the low numbers are most likely related to her diarrhea over the past several weeks.  I suspect they are in fact falsely lowered because of this.  Overall though she feels better, denying any worsening shortness of breath or chest pain.  Pressure is mildly elevated 148/82 today.  She says is been running in the 140s at home.  10/18/2018  Andrea Brooks is seen today in routine follow-up.  Overall she is  doing well.  She is down about 6 pounds since I last saw her.  Some of that was fluid related to her diastolic heart failure also after her significant E. coli infection she is not had much of an appetite and continues to have IBS-D symptoms.  Overall she denies any chest pain or worsening shortness of breath.  We did repeat some labs indicated a small increase in her lipid profile.  Total cholesterol now 120, triglycerides 162, HDL 31 and LDL 57.  She says she did have a decrease in metformin due to improving hemoglobin A1c is 5.6.  EKG today shows sinus rhythm with left anterior fascicular block at 78.  04/14/2020  Andrea Brooks returns for follow-up.  She has no specific complaints today.  Dr. Loletha Carrow for her IBS-D symptoms.  The profile was in December 2020 which showed total cholesterol 102, triglycerides 133, HDL 28 and LDL of 50.  EKG shows sinus rhythm with first-degree AV block, left anterior fascicular block and voltage criteria for LVH at 84.  04/08/2021  Andrea Brooks is seen today in follow-up.  Unfortunately she had an eventful April.  She was hospitalized and found to have what was concerning for pneumatosis on a CT scan.  She was having abdominal pain.  She ended up going to surgery urgently but was not found to have any ischemic bowel.  Ultimately she has been placed on Flagyl and is seemingly responding to antibiotic therapy.  She had also previously had a consultation up at the [facility] clinic and then was directed to  [facility].  Fortunately with all this she had no heart failure symptoms.  She seems to be stable on her current medications with good blood pressure control.  She denies any weight gain or edema.  EKG shows a sinus rhythm with first-degree AV block.  Her lipids in December were well controlled with total cholesterol of 88 and an LDL of 42.  PMHx:  Past Medical History:  Diagnosis Date  . Anxiety   . CHF (congestive heart failure) ([place])   . Colon polyps   . Diabetes mellitus  without complication ([place])   . Diverticulosis   . Food poisoning   . Gallstones   . GERD (gastroesophageal reflux disease)   . Hyperlipidemia   . Hypertension   . Obesity   . 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 ABDOMINAL HYSTERECTOMY     total  . BREAST BIOPSY Left    neg  . CHOLECYSTECTOMY    . JOINT REPLACEMENT Bilateral    knee  . LAPAROTOMY N/A 03/11/2021   Procedure: EXPLORATORY LAPAROTOMY;  Surgeon: Clovis Riley, MD;  Location: [place];  Service: General;  Laterality: N/A;  . LEFT HEART CATH AND CORONARY ANGIOGRAPHY N/A 07/04/2018   Procedure: LEFT HEART CATH AND CORONARY ANGIOGRAPHY;  Surgeon: Jettie Booze, MD;  Location: [place] [facility];  Service: Cardiovascular;  Laterality: N/A;  . REPLACEMENT TOTAL KNEE BILATERAL      FAMHx:  Family History  Problem Relation Age of Onset  . Hypertension Mother   . Colon cancer Mother   . Stroke Father   . Heart failure Father   . Stroke Sister   . Stroke Brother   . Graves' disease Daughter   . Breast cancer Paternal Aunt   . Graves' disease Sister   . Bone cancer Sister   . Breast cancer Maternal Aunt   . Esophageal cancer Neg Hx   . Rectal cancer Neg Hx   . Liver cancer Neg Hx     SOCHx:   reports that she quit smoking about 61 years ago. She has a 2.00 pack-year smoking history. She has never used smokeless tobacco. She reports that she does not drink alcohol and does not use drugs.  ALLERGIES:  Allergies  Allergen Reactions  . Tape Rash and Other (See Comments)    PAPER TAPE ONLY!!!! NO CLEAR, PLASTIC TAPE- TEARS THE SKIN!!    ROS: Pertinent items noted in HPI and remainder of comprehensive ROS otherwise negative.  HOME MEDS: Current Outpatient Medications on File Prior to Visit  Medication Sig Dispense Refill  . acetaminophen (TYLENOL) 500 MG tablet Take 1,000 mg by mouth every 6  (six) hours as needed for moderate pain.    Marland Kitchen alum & mag hydroxide-simeth (MAALOX/MYLANTA) 200-200-20 MG/5ML suspension Take 30 mLs by mouth every 6 (six) hours as needed for indigestion or heartburn.    Marland Kitchen aspirin EC 81 MG tablet Take 81 mg by mouth daily.    . calcium carbonate (TUMS - DOSED IN MG ELEMENTAL CALCIUM) 500 MG chewable tablet Chew 2 tablets by mouth as needed for indigestion or heartburn.    . carvedilol (COREG) 6.25 MG tablet Take 2 tablets (12.5 mg total) by mouth 2 (two) times daily with a meal. 180 tablet 3  . dicyclomine (BENTYL) 10 MG capsule Take 1 capsule (10 mg total) by mouth 3 (three) times daily as  needed for spasms (IBS flareup). Future refills need to come from GI 30 capsule 1  . ezetimibe-simvastatin (VYTORIN) 10-20 MG tablet TAKE 1 TABLET DAILY 90 tablet 0  . hydrocortisone (ANUSOL-HC) 2.5 % rectal cream Place 1 application rectally 2 (two) times daily. 30 g 1  . LORazepam (ATIVAN) 1 MG tablet 1/2 tablet daily as needed for anxiety (Patient taking differently: Take 0.5-1 mg by mouth as needed for anxiety.) 30 tablet 0  . metoCLOPramide (REGLAN) 5 MG tablet Take 1 tablet (5 mg total) by mouth every 8 (eight) hours as needed for nausea. 12 tablet 0  . metroNIDAZOLE (FLAGYL) 500 MG tablet Take 1 tablet (500 mg total) by mouth 3 (three) times daily. 90 tablet 0  . Multiple Vitamins-Minerals (MULTI FOR HER 50+) TABS Take 1 tablet by mouth daily.    Marland Kitchen omeprazole (PRILOSEC) 20 MG capsule Take 1 capsule by mouth daily, 30 minutes before breakfast. 90 capsule 3  . ondansetron (ZOFRAN-ODT) 4 MG disintegrating tablet Take 4 mg by mouth as needed for nausea or vomiting.    . Probiotic Product (SOLUBLE FIBER/PROBIOTICS) CHEW Chew 2 tablets by mouth daily.    . valsartan (DIOVAN) 320 MG tablet TAKE 1 TABLET DAILY 90 tablet 0   No current facility-administered medications on file prior to visit.    LABS/IMAGING: No results found for this or any previous visit (from the past 48  hour(s)). No results found.  LIPID PANEL:    Component Value Date/Time   CHOL 88 (L) 11/11/2020 0903   TRIG 107 11/11/2020 0903   HDL 26 (L) 11/11/2020 0903   CHOLHDL 3.4 11/11/2020 0903   CHOLHDL 3.5 07/04/2018 0526   VLDL 26 07/04/2018 0526   LDLCALC 42 11/11/2020 0903     WEIGHTS: Wt Readings from Last 3 Encounters:  04/08/21 150 lb 6.4 oz (68.2 kg)  03/12/21 154 lb 1.6 oz (69.9 kg)  01/24/21 156 lb 9.6 oz (71 kg)    VITALS: BP 118/68 (BP Location: Left Arm, Patient Position: Sitting, Cuff Size: Normal)   Pulse 60   Ht 5\' 3"  (1.6 m)   Wt 150 lb 6.4 oz (68.2 kg)   BMI 26.64 kg/m   EXAM: General appearance: alert and no distress Neck: no carotid bruit, no JVD and thyroid not enlarged, symmetric, no tenderness/mass/nodules Lungs: clear to auscultation bilaterally Heart: regular rate and rhythm, S1, S2 normal, no murmur, click, rub or gallop Abdomen: soft, non-tender; bowel sounds normal; no masses,  no organomegaly Extremities: extremities normal, atraumatic, no cyanosis or edema Pulses: 2+ and symmetric Skin: Skin color, texture, turgor normal. No rashes or lesions Neurologic: Grossly normal Psych: Pleasant  EKG: Sinus rhythm first-degree AV block at 60, moderate voltage criteria for LVH, inferior infarct pattern--personally reviewed  ASSESSMENT: 1. History of acute diastolic congestive heart failure (EF 60 to 65%, 06/2018) 2. Dyslipidemia 3. Hypertension 4. Mild nonobstructive coronary disease (cath 06/2018) 5. SIBO 6. Pneumatosis  PLAN: 1.   Andrea Brooks has been struggling with a number of GI issues but was recently hospitalized with a thought of she might possibly have ischemic bowel but it turns out that she mostly had pneumatosis.  She is on antibiotic therapy at this point.  Fortunately she did not have any exacerbation of diastolic congestive heart failure.  She is not even on a diuretic but has good blood pressure control.  Cholesterol is at goal as well.   From a cardiac standpoint I think she is doing very well and seems to tolerate  general anesthesia without any incident.  Continue current medicines.  Plan follow-up with me annually or sooner as necessary.  Andrea Casino, MD, [facility] [employer], [place] Director of the [facility] Lipid [facility] &  Cardiovascular Risk Reduction Clinic Diplomate of the American Board of Clinical Lipidology Attending Cardiologist  Direct Dial: [phone]  Fax: [phone]  Website:  [email or web address] Andrea Brooks 04/08/2021, 11:31 AM

## 2021-04-08 NOTE — Patient Instructions (Signed)

## 2021-04-11 ENCOUNTER — Encounter: Payer: Self-pay | Admitting: Gastroenterology

## 2021-04-11 ENCOUNTER — Ambulatory Visit: Payer: Medicare HMO | Admitting: Gastroenterology

## 2021-04-11 VITALS — BP 130/80 | HR 67 | Ht 63.0 in | Wt 147.0 lb

## 2021-04-11 DIAGNOSIS — K6389 Other specified diseases of intestine: Secondary | ICD-10-CM

## 2021-04-11 NOTE — Progress Notes (Signed)
04/11/2021 Krissi Brendel [id number] 1936-04-03   HISTORY OF PRESENT ILLNESS: This is an 85 year old female who is a patient of Dr. Corena Pilgrim.  I saw her back in March at which time she had complaints of ongoing gas/bloating with alternating bowel habits.  All of her symptoms worsened following an episode of E. coli.  I suspected that she had SIBO.  I treated her with Xifaxan for 2 weeks and while she took that medication her symptoms greatly improved, but upon discontinuing it her symptoms returned within 2 days.  We then decided to proceed with CT scan of the abdomen and pelvis with contrast, which showed concern for bowel perforation and small bowel pneumatosis.  At urgent/emergent exploratory laparotomy all examined intestine was viable and no evidence of obstruction was seen.  There were unusual clusters of air/bubbles beneath the visceral peritoneum along the surface of the small bowel, mesentery, and omentum.  This is all thought to be possibly from chronic ongoing SIBO.  Since her hospital discharge she has been on Flagyl 500 mg 3 times daily.  She is here today with her daughter for follow-up and they tell me that she feels amazing.  She does not have any complaints at this time.  She has been following a low FODMAP diet, but has slowly been introducing other foods back again and has felt great.  Is tolerating the flagyl well.   Past Medical History:  Diagnosis Date  . Anxiety   . CHF (congestive heart failure) ([place])   . Colon polyps   . Diabetes mellitus without complication ([place])   . Diverticulosis   . Food poisoning   . Gallstones   . GERD (gastroesophageal reflux disease)   . Hyperlipidemia   . Hypertension   . Obesity   . 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 ABDOMINAL HYSTERECTOMY     total  . BREAST BIOPSY Left    neg  . CHOLECYSTECTOMY    . JOINT  REPLACEMENT Bilateral    knee  . LAPAROTOMY N/A 03/11/2021   Procedure: EXPLORATORY LAPAROTOMY;  Surgeon: Clovis Riley, MD;  Location: [place];  Service: General;  Laterality: N/A;  . LEFT HEART CATH AND CORONARY ANGIOGRAPHY N/A 07/04/2018   Procedure: LEFT HEART CATH AND CORONARY ANGIOGRAPHY;  Surgeon: Jettie Booze, MD;  Location: [place] [facility];  Service: Cardiovascular;  Laterality: N/A;  . REPLACEMENT TOTAL KNEE BILATERAL      reports that she quit smoking about 61 years ago. She has a 2.00 pack-year smoking history. She has never used smokeless tobacco. She reports that she does not drink alcohol and does not use drugs. family history includes Bone cancer in her sister; Breast cancer in her maternal aunt and paternal aunt; Colon cancer in her mother; Berenice Primas' disease in her daughter and sister; Heart failure in her father; Hypertension in her mother; Stroke in her brother, father, and sister. Allergies  Allergen Reactions  . Tape Rash and Other (See Comments)    PAPER TAPE ONLY!!!! NO CLEAR, PLASTIC TAPE- TEARS THE SKIN!!      Outpatient Encounter Medications as of 04/11/2021  Medication Sig  . acetaminophen (TYLENOL) 500 MG tablet Take 1,000 mg by mouth every 6 (six) hours as needed for moderate pain.  Marland Kitchen alum & mag hydroxide-simeth (MAALOX/MYLANTA) 200-200-20 MG/5ML suspension Take 30 mLs by mouth every 6 (six) hours  as needed for indigestion or heartburn.  Marland Kitchen aspirin EC 81 MG tablet Take 81 mg by mouth daily.  . calcium carbonate (TUMS - DOSED IN MG ELEMENTAL CALCIUM) 500 MG chewable tablet Chew 2 tablets by mouth as needed for indigestion or heartburn.  . carvedilol (COREG) 6.25 MG tablet Take 1 tablet (6.25 mg total) by mouth 2 (two) times daily with a meal.  . dicyclomine (BENTYL) 10 MG capsule Take 1 capsule (10 mg total) by mouth 3 (three) times daily as needed for spasms (IBS flareup). Future refills need to come from GI  . ezetimibe-simvastatin (VYTORIN) 10-20 MG  tablet TAKE 1 TABLET DAILY  . hydrocortisone (ANUSOL-HC) 2.5 % rectal cream Place 1 application rectally 2 (two) times daily.  Marland Kitchen LORazepam (ATIVAN) 1 MG tablet 1/2 tablet daily as needed for anxiety (Patient taking differently: Take 0.5-1 mg by mouth as needed for anxiety.)  . metoCLOPramide (REGLAN) 5 MG tablet Take 1 tablet (5 mg total) by mouth every 8 (eight) hours as needed for nausea.  . metroNIDAZOLE (FLAGYL) 500 MG tablet Take 1 tablet (500 mg total) by mouth 3 (three) times daily.  . Multiple Vitamins-Minerals (MULTI FOR HER 50+) TABS Take 1 tablet by mouth daily.  Marland Kitchen omeprazole (PRILOSEC) 20 MG capsule Take 1 capsule by mouth daily, 30 minutes before breakfast.  . ondansetron (ZOFRAN-ODT) 4 MG disintegrating tablet Take 4 mg by mouth as needed for nausea or vomiting.  . Probiotic Product (SOLUBLE FIBER/PROBIOTICS) CHEW Chew 2 tablets by mouth daily.  . valsartan (DIOVAN) 320 MG tablet TAKE 1 TABLET DAILY   No facility-administered encounter medications on file as of 04/11/2021.     REVIEW OF SYSTEMS  : All other systems reviewed and negative except where noted in the History of Present Illness.   PHYSICAL EXAM: BP 130/80   Pulse 67   Ht 5\' 3"  (1.6 m)   Wt 147 lb (66.7 kg)   SpO2 94%   BMI 26.04 kg/m  General: Well developed white female in no acute distress Head: Normocephalic and atraumatic Eyes:  Sclerae anicteric, conjunctiva pink. Ears: Normal auditory acuity Lungs: Clear throughout to auscultation; no W/R/R. Heart: Regular rate and rhythm; no M/R/G. Abdomen: Soft, non-distended.  BS present.  Non-tender. Musculoskeletal: Symmetrical with no gross deformities  Skin: No lesions on visible extremities Extremities: No edema  Neurological: Alert oriented x 4, grossly non-focal Psychological:  Alert and cooperative. Normal mood and affect  ASSESSMENT AND PLAN: *85 year old female with longstanding symptoms felt to be IBS related, possibly SIBO.  Improved significantly  on Xifaxan, but recurred within a couple of days following treatment.  CT scan showed concern for bowel perforation and small bowel pneumatosis.  At urgent/emergent exploratory laparotomy all examined intestine was viable and no evidence of obstruction was seen.  There were unusual clusters of air/bubbles beneath the visceral peritoneum along the surface of the small bowel, mesentery, and omentum.  This is all thought to be possibly from chronic ongoing SIBO.  She has been significantly improved on ongoing treatment with Flagyl.  She has 5 days left of that.  She has also been following FODMAP diet and slowly introducing new things back in, but has felt wonderful.  Xifaxan was cost prohibitive for the 4-week course that was recommended.  She will finish her Flagyl.  Suspect that she will need retreatment at some point, but hopefully she will do well for a while.  We will plan for repeat CT scan to have comparison to the previous now  that she been treated and has been feeling well.  We will plan for repeat CT scan in early July as we do want its with both p.o. and IV contrast so that we have similar images compared to previous (currently IV contrast shortage).  She will call us back in the interim if she has any recurrence of symptoms at which time we will place her back on Xifaxan or Flagyl   CC:  Lorrene Reid, PA-C

## 2021-04-11 NOTE — Patient Instructions (Signed)
If you are age 83 or older, your body mass index should be between 23-30. Your Body mass index is 26.04 kg/m. If this is out of the aforementioned range listed, please consider follow up with your Primary Care Provider.  If you are age 62 or younger, your body mass index should be between 19-25. Your Body mass index is 26.04 kg/m. If this is out of the aformentioned range listed, please consider follow up with your Primary Care Provider.   We will call you in July to schedule CT Scan.  The [facility] providers would like to encourage you to use [facility] to communicate with providers for non-urgent requests or questions.  Due to long hold times on the telephone, sending your provider a message by [facility] may be a faster and more efficient way to get a response.  Please allow 48 business hours for a response.  Please remember that this is for non-urgent requests.    Thank you for choosing me and [facility].  Alonza Bogus, PA-C

## 2021-04-15 NOTE — Progress Notes (Signed)
____________________________________________________________  Attending physician addendum:  Thank you for sending this case to me. I have reviewed the entire note and discussing it with me in clinic that day.  This was truly a puzzling case.  I have never seen SIBO cause something like this, but that seems to be what occurred. If the chronic anatomic changes in the small bowel persist, then she will likely have ongoing stasis and therefore high chance of recurrent SIBO requiring antibiotic therapy.  I expect we will hear from her not long after finishing antibiotics because she will have recurrence of symptoms.  If so, then she really cannot be on long-term Flagyl due to the risk of neurologic side effects.  She would need rifaximin 550 mg at least once daily, and may need it indefinitely.  Please have our staff look into the necessary prior authorization or any available pharmaceutical company plans to reduce the cost. Regarding the repeat CT scan, it should wait until we are hopefully past the national shortage of IV contrast in the near future.  The skin would be limited with oral contrast alone.  While this findings might not change our management, it would still be helpful to know if the small bowel changes persist after a long period of SIBO treatment in the event that the patient has a major clinical change in the future.  We would then be able to compare multiple scans done at different stages of the illness.  Wilfrid Lund, MD  ____________________________________________________________

## 2021-05-02 ENCOUNTER — Other Ambulatory Visit: Payer: Medicare HMO

## 2021-05-02 ENCOUNTER — Telehealth: Payer: Self-pay | Admitting: Gastroenterology

## 2021-05-02 ENCOUNTER — Other Ambulatory Visit: Payer: Self-pay

## 2021-05-02 DIAGNOSIS — Z1329 Encounter for screening for other suspected endocrine disorder: Secondary | ICD-10-CM

## 2021-05-02 DIAGNOSIS — E1159 Type 2 diabetes mellitus with other circulatory complications: Secondary | ICD-10-CM

## 2021-05-02 DIAGNOSIS — E119 Type 2 diabetes mellitus without complications: Secondary | ICD-10-CM

## 2021-05-02 DIAGNOSIS — K580 Irritable bowel syndrome with diarrhea: Secondary | ICD-10-CM

## 2021-05-02 DIAGNOSIS — I152 Hypertension secondary to endocrine disorders: Secondary | ICD-10-CM

## 2021-05-02 DIAGNOSIS — E785 Hyperlipidemia, unspecified: Secondary | ICD-10-CM

## 2021-05-02 NOTE — Telephone Encounter (Signed)
The pt has been advised to eat only bland foods for now and avoid gassy foods  She will call back if this does not help her symptoms.      The pt was asked to call back if she had a recurrence of symptoms.  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  The pt began adding food back to her diet and last night she ate Elizebeth Koller with sausage, baked potato and salad.  2-3 hours after eating began to have bloating and then vomited and felt some better.  She wants to know if she needs flagyl course?  Please advise

## 2021-05-02 NOTE — Telephone Encounter (Signed)
I would probably hold off since she just had that 1 episode last night.  Continue to monitor and observe over the next several days.  I would probably try to avoid sauerkraut and maybe even salad for a little while.  She had been doing well adding several things into her diet, but maybe just back off on some of those gas producing things again for a while.

## 2021-05-02 NOTE — Telephone Encounter (Signed)
Pt called to informed that last night she had a terrible episode. Janett Billow told her to let us know if that happened so she would prescribe Flagyl again or something else.

## 2021-05-03 LAB — COMPREHENSIVE METABOLIC PANEL
ALT: 19 IU/L (ref 0–32)
AST: 23 IU/L (ref 0–40)
Albumin/Globulin Ratio: 1.2 (ref 1.2–2.2)
Albumin: 3.7 g/dL (ref 3.6–4.6)
Alkaline Phosphatase: 70 IU/L (ref 44–121)
BUN/Creatinine Ratio: 19 (ref 12–28)
BUN: 13 mg/dL (ref 8–27)
Bilirubin Total: 0.4 mg/dL (ref 0.0–1.2)
CO2: 25 mmol/L (ref 20–29)
Calcium: 9.3 mg/dL (ref 8.7–10.3)
Chloride: 104 mmol/L (ref 96–106)
Creatinine, Ser: 0.69 mg/dL (ref 0.57–1.00)
Globulin, Total: 3.1 g/dL (ref 1.5–4.5)
Glucose: 134 mg/dL — ABNORMAL HIGH (ref 65–99)
Potassium: 4.2 mmol/L (ref 3.5–5.2)
Sodium: 142 mmol/L (ref 134–144)
Total Protein: 6.8 g/dL (ref 6.0–8.5)
eGFR: 87 mL/min/{1.73_m2} (ref >59)

## 2021-05-03 LAB — LIPID PANEL
Chol/HDL Ratio: 3.4 ratio (ref 0.0–4.4)
Cholesterol, Total: 86 mg/dL — ABNORMAL LOW (ref 100–199)
HDL: 25 mg/dL — ABNORMAL LOW (ref >39)
LDL Chol Calc (NIH): 44 mg/dL (ref 0–99)
Triglycerides: 85 mg/dL (ref 0–149)
VLDL Cholesterol Cal: 17 mg/dL (ref 5–40)

## 2021-05-03 LAB — CBC
Hematocrit: 38.9 % (ref 34.0–46.6)
Hemoglobin: 13 g/dL (ref 11.1–15.9)
MCH: 30 pg (ref 26.6–33.0)
MCHC: 33.4 g/dL (ref 31.5–35.7)
MCV: 90 fL (ref 79–97)
Platelets: 261 10*3/uL (ref 150–450)
RBC: 4.34 x10E6/uL (ref 3.77–5.28)
RDW: 13.5 % (ref 11.7–15.4)
WBC: 10.6 10*3/uL (ref 3.4–10.8)

## 2021-05-03 LAB — HEMOGLOBIN A1C
Est. average glucose Bld gHb Est-mCnc: 108 mg/dL
Hgb A1c MFr Bld: 5.4 % (ref 4.8–5.6)

## 2021-05-03 LAB — TSH: TSH: 2.64 u[IU]/mL (ref 0.450–4.500)

## 2021-05-07 ENCOUNTER — Telehealth: Payer: Self-pay | Admitting: Gastroenterology

## 2021-05-07 NOTE — Telephone Encounter (Signed)
The pt has a history of Pneumatosis intestinalis, she has been having a soft diet.  After she ate dinner last night she began belching.  She vomited once in the middle of the night.  Had a larger BM at 6 am then began having diarrhea.  She wants to know if she should get another round of flagyl.  Please advise

## 2021-05-07 NOTE — Telephone Encounter (Signed)
I agree, this is concerning.  These are my recommendations:  Metronidazole 500 mg, 1 tablet 3 times daily x14 days. Dispense 42 tablets, 0 refill  Given this overall clinical course, it seems likely she will need long-term antibiotic therapy, but that cannot be metronidazole due to the risk of neuropathy with long-term use.  Therefore, she will need rifaximin 550 mg, one tablet twice daily.  At least a 21-day course initially, and she might need it indefinitely depending on her clinical course. Therefore, we need to work on a specialty pharmacy and prior authorization if necessary.  Last, we had been holding off on the repeat CT scan due to the current IV contrast shortage.  However, I feel we should now get it done next week with both oral and IV contrast. When radiology asks if it can be done without IV contrast, we should reply will be that IV contrast is necessary for this exam.  I want to see if she has stable or progressive focal small bowel dilatation to suggest obstruction. We will anticipate an urgent call report from the radiologist given the likely findings of pneumatosis as on the prior scan. (I am documenting that here in case a covering/after hours physician gets that call)  - HD

## 2021-05-07 NOTE — Telephone Encounter (Signed)
Left message on machine to call back  

## 2021-05-07 NOTE — Telephone Encounter (Signed)
Patient calling to inform she is experiencing constipation/diarrhea. Pt wants to know how to treat sxs.. Plz advise..thanks

## 2021-05-08 ENCOUNTER — Other Ambulatory Visit: Payer: Self-pay

## 2021-05-08 DIAGNOSIS — K6389 Other specified diseases of intestine: Secondary | ICD-10-CM

## 2021-05-08 DIAGNOSIS — K56699 Other intestinal obstruction unspecified as to partial versus complete obstruction: Secondary | ICD-10-CM

## 2021-05-08 MED ORDER — METRONIDAZOLE 500 MG PO TABS: 500.0000 mg | ORAL_TABLET | Freq: Three times a day (TID) | ORAL | 0 refills | Status: AC

## 2021-05-08 MED ORDER — RIFAXIMIN 550 MG PO TABS: 550.0000 mg | ORAL_TABLET | Freq: Two times a day (BID) | ORAL | 0 refills | Status: AC

## 2021-05-08 MED ORDER — METRONIDAZOLE 500 MG PO TABS: 500.0000 mg | ORAL_TABLET | Freq: Three times a day (TID) | ORAL | 0 refills | Status: DC

## 2021-05-08 NOTE — Telephone Encounter (Signed)
Pt is requesting prescription for Flagyl sent to [facility] on [address]. She would like to keep prescription sent to [facility] if possible, too. Pt wants a call back to confirm prescription for [facility].

## 2021-05-08 NOTE — Telephone Encounter (Signed)
Patient returned your call, please call patient one more time at [phone].

## 2021-05-08 NOTE — Telephone Encounter (Signed)
Prescriptions sent to the pharmacy  CT scan has been scheduled for 05/14/21 at 12 noon at [facility] to pick up contrast at [facility]   The patient has been notified of this information and all questions answered. She will pick up contrast a day or two prior to the appt.  She will be expecting a call from [place].  She will call back if she has any questions or concerns.

## 2021-05-08 NOTE — Telephone Encounter (Signed)
I have resent the [facility] to [facility] as asked per pt.  She has been advised. Prescription has been cancelled at [facility].

## 2021-05-09 ENCOUNTER — Encounter: Payer: Self-pay | Admitting: Physician Assistant

## 2021-05-09 ENCOUNTER — Other Ambulatory Visit: Payer: Self-pay

## 2021-05-09 ENCOUNTER — Ambulatory Visit (INDEPENDENT_AMBULATORY_CARE_PROVIDER_SITE_OTHER): Payer: Medicare HMO | Admitting: Physician Assistant

## 2021-05-09 VITALS — BP 120/80 | HR 76 | Temp 97.4°F | Ht 64.0 in | Wt 140.8 lb

## 2021-05-09 DIAGNOSIS — Z0000 Encounter for general adult medical examination without abnormal findings: Secondary | ICD-10-CM | POA: Diagnosis not present

## 2021-05-09 DIAGNOSIS — E119 Type 2 diabetes mellitus without complications: Secondary | ICD-10-CM | POA: Diagnosis not present

## 2021-05-09 NOTE — Patient Instructions (Signed)
Preventive Care 83 Years and Older, Female Preventive care refers to lifestyle choices and visits with your health care provider that can promote health and wellness. This includes: A yearly physical exam. This is also called an annual wellness visit. Regular dental and eye exams. Immunizations. Screening for certain conditions. Healthy lifestyle choices, such as: Eating a healthy diet. Getting regular exercise. Not using drugs or products that contain nicotine and tobacco. Limiting alcohol use. What can I expect for my preventive care visit? Physical exam Your health care provider will check your: Height and weight. These may be used to calculate your BMI (body mass index). BMI is a measurement that tells if you are at a healthy weight. Heart rate and blood pressure. Body temperature. Skin for abnormal spots. Counseling Your health care provider may ask you questions about your: Past medical problems. Family's medical history. Alcohol, tobacco, and drug use. Emotional well-being. Home life and relationship well-being. Sexual activity. Diet, exercise, and sleep habits. History of falls. Memory and ability to understand (cognition). Work and work Statistician. Pregnancy and menstrual history. Access to firearms. What immunizations do I need?  Vaccines are usually given at various ages, according to a schedule. Your health care provider will recommend vaccines for you based on your age, medicalhistory, and lifestyle or other factors, such as travel or where you work. What tests do I need? Blood tests Lipid and cholesterol levels. These may be checked every 5 years, or more often depending on your overall health. Hepatitis C test. Hepatitis B test. Screening Lung cancer screening. You may have this screening every year starting at age 44 if you have a 30-pack-year history of smoking and currently smoke or have quit within the past 15 years. Colorectal cancer screening. All  adults should have this screening starting at age 39 and continuing until age 65. Your health care provider may recommend screening at age 61 if you are at increased risk. You will have tests every 1-10 years, depending on your results and the type of screening test. Diabetes screening. This is done by checking your blood sugar (glucose) after you have not eaten for a while (fasting). You may have this done every 1-3 years. Mammogram. This may be done every 1-2 years. Talk with your health care provider about how often you should have regular mammograms. Abdominal aortic aneurysm (AAA) screening. You may need this if you are a current or former smoker. BRCA-related cancer screening. This may be done if you have a family history of breast, ovarian, tubal, or peritoneal cancers. Other tests STD (sexually transmitted disease) testing, if you are at risk. Bone density scan. This is done to screen for osteoporosis. You may have this done starting at age 54. Talk with your health care provider about your test results, treatment options,and if necessary, the need for more tests. Follow these instructions at home: Eating and drinking  Eat a diet that includes fresh fruits and vegetables, whole grains, lean protein, and low-fat dairy products. Limit your intake of foods with high amounts of sugar, saturated fats, and salt. Take vitamin and mineral supplements as recommended by your health care provider. Do not drink alcohol if your health care provider tells you not to drink. If you drink alcohol: Limit how much you have to 0-1 drink a day. Be aware of how much alcohol is in your drink. In the [place] one drink equals one 12 oz bottle of beer (355 mL), one 5 oz glass of wine (148 mL), or one 1  oz glass of hard liquor (44 mL).  Lifestyle Take daily care of your teeth and gums. Brush your teeth every morning and night with fluoride toothpaste. Floss one time each day. Stay active. Exercise for at  least 30 minutes 5 or more days each week. Do not use any products that contain nicotine or tobacco, such as cigarettes, e-cigarettes, and chewing tobacco. If you need help quitting, ask your health care provider. Do not use drugs. If you are sexually active, practice safe sex. Use a condom or other form of protection in order to prevent STIs (sexually transmitted infections). Talk with your health care provider about taking a low-dose aspirin or statin. Find healthy ways to cope with stress, such as: Meditation, yoga, or listening to music. Journaling. Talking to a trusted person. Spending time with friends and family. Safety Always wear your seat belt while driving or riding in a vehicle. Do not drive: If you have been drinking alcohol. Do not ride with someone who has been drinking. When you are tired or distracted. While texting. Wear a helmet and other protective equipment during sports activities. If you have firearms in your house, make sure you follow all gun safety procedures. What's next? Visit your health care provider once a year for an annual wellness visit. Ask your health care provider how often you should have your eyes and teeth checked. Stay up to date on all vaccines. This information is not intended to replace advice given to you by your health care provider. Make sure you discuss any questions you have with your healthcare provider. Document Revised: 10/30/2020 Document Reviewed: 11/03/2018 Elsevier Patient Education  2022 [employer].

## 2021-05-09 NOTE — Progress Notes (Signed)
Subjective:   Andrea Brooks is a 85 y.o. female who presents for Medicare Annual (Subsequent) preventive examination.  Review of Systems    Review of Systems: General:   No F/C, unintentional wt loss Pulm:   No DIB, SOB, pleuritic chest pain Card:  No CP, palpitations Abd:  +abd pain Ext:  No inc edema from baseline    Objective:    Today's Vitals   05/09/21 1101  BP: 120/80  Pulse: 76  Temp: (!) 97.4 F (36.3 C)  SpO2: 96%  Weight: 140 lb 12.8 oz (63.9 kg)  Height: 5\' 4"  (1.626 m)   Body mass index is 24.17 kg/m.  Advanced Directives 03/12/2021 03/12/2021 10/29/2019 01/16/2019 09/08/2018 07/02/2018 06/28/2018  Does Patient Have a Medical Advance Directive? - No Yes Yes No No No  Type of Advance Directive - - [place];Living will Living will;Healthcare Power of Attorney - - -  Would patient like information on creating a medical advance directive? No - Patient declined - - - No - Patient declined No - Patient declined No - Patient declined    Current Medications (verified) Outpatient Encounter Medications as of 05/09/2021  Medication Sig   acetaminophen (TYLENOL) 500 MG tablet Take 1,000 mg by mouth every 6 (six) hours as needed for moderate pain.   alum & mag hydroxide-simeth (MAALOX/MYLANTA) 200-200-20 MG/5ML suspension Take 30 mLs by mouth every 6 (six) hours as needed for indigestion or heartburn.   aspirin EC 81 MG tablet Take 81 mg by mouth daily.   calcium carbonate (TUMS - DOSED IN MG ELEMENTAL CALCIUM) 500 MG chewable tablet Chew 2 tablets by mouth as needed for indigestion or heartburn.   carvedilol (COREG) 6.25 MG tablet Take 1 tablet (6.25 mg total) by mouth 2 (two) times daily with a meal.   dicyclomine (BENTYL) 10 MG capsule Take 1 capsule (10 mg total) by mouth 3 (three) times daily as needed for spasms (IBS flareup). Future refills need to come from GI   ezetimibe-simvastatin (VYTORIN) 10-20 MG tablet TAKE 1 TABLET DAILY   hydrocortisone  (ANUSOL-HC) 2.5 % rectal cream Place 1 application rectally 2 (two) times daily.   LORazepam (ATIVAN) 1 MG tablet 1/2 tablet daily as needed for anxiety (Patient taking differently: Take 0.5-1 mg by mouth as needed for anxiety.)   metoCLOPramide (REGLAN) 5 MG tablet Take 1 tablet (5 mg total) by mouth every 8 (eight) hours as needed for nausea.   metroNIDAZOLE (FLAGYL) 500 MG tablet Take 1 tablet (500 mg total) by mouth 3 (three) times daily for 14 days.   Multiple Vitamins-Minerals (MULTI FOR HER 50+) TABS Take 1 tablet by mouth daily.   omeprazole (PRILOSEC) 20 MG capsule Take 1 capsule by mouth daily, 30 minutes before breakfast.   ondansetron (ZOFRAN-ODT) 4 MG disintegrating tablet Take 4 mg by mouth as needed for nausea or vomiting.   Probiotic Product (SOLUBLE FIBER/PROBIOTICS) CHEW Chew 2 tablets by mouth daily.   rifaximin (XIFAXAN) 550 MG TABS tablet Take 1 tablet (550 mg total) by mouth 2 (two) times daily for 21 days.   valsartan (DIOVAN) 320 MG tablet TAKE 1 TABLET DAILY   No facility-administered encounter medications on file as of 05/09/2021.    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   Left forearm and Left forearm superior   Past Surgical History:  Procedure Laterality Date   ABDOMINAL HYSTERECTOMY     total   BREAST BIOPSY Left    neg   CHOLECYSTECTOMY     JOINT REPLACEMENT Bilateral    knee   LAPAROTOMY N/A 03/11/2021   Procedure: EXPLORATORY LAPAROTOMY;  Surgeon: Clovis Riley, MD;  Location: [facility];  Service: General;  Laterality: N/A;   LEFT HEART CATH AND CORONARY ANGIOGRAPHY N/A 07/04/2018   Procedure: LEFT HEART  CATH AND CORONARY ANGIOGRAPHY;  Surgeon: Jettie Booze, MD;  Location: [place] [facility];  Service: Cardiovascular;  Laterality: N/A;   REPLACEMENT TOTAL KNEE BILATERAL     Family History  Problem Relation Age of Onset   Hypertension Mother    Colon cancer Mother    Stroke Father    Heart failure Father    Stroke Sister    Stroke Brother    Graves' disease Daughter    Breast cancer Paternal Aunt    Graves' disease Sister    Bone cancer Sister    Breast cancer Maternal Aunt    Esophageal cancer Neg Hx    Rectal cancer Neg Hx    Liver cancer Neg Hx    Social History   Socioeconomic History   Marital status: Married    Spouse name: Not on file   Number of children: 1   Years of education: Not on file   Highest education level: Not on file  Occupational History   Occupation: retired  Tobacco Use   Smoking status: Former    Packs/day: 1.00    Years: 2.00    Pack years: 2.00    Types: Cigarettes    Quit date: 11/24/1959    Years since quitting: 61.4   Smokeless tobacco: Never  Vaping Use   Vaping Use: Never used  Substance and Sexual Activity   Alcohol use: No   Drug use: No   Sexual activity: Not Currently  Other Topics Concern   Not on file  Social History Narrative   Not on file   Social Determinants of Health   Financial Resource Strain: Not on file  Food Insecurity: Not on file  Transportation Needs: Not on file  Physical Activity: Not on file  Stress: Not on file  Social Connections: Not on file    Tobacco Counseling Counseling given: Not Answered    Diabetic?Yes         Activities of Daily Living In your present state of health, do you have any difficulty performing the following activities: 05/09/2021 03/12/2021  Hearing? N N  Vision? N N  Difficulty concentrating or making decisions? N N  Walking or climbing stairs? Y N  Dressing or bathing? N N  Doing errands, shopping? N N  Some recent data might be hidden    Patient Care  Team: Lorrene Reid, PA-C as PCP - General (Physician Assistant) Pixie Casino, MD as PCP - Cardiology (Cardiology) Loletha Carrow Kirke Corin, MD as Consulting Physician (Gastroenterology)  Indicate any recent Medical Services you may have received from other than [facility] providers in the past year (date may be approximate).     Assessment:   This is a routine wellness examination for Shontay.  Hearing/Vision screen No results found.  Dietary  issues and exercise activities discussed: -Recommend to continue with FODMAP and low sugar diet due to GI issues. Continue walking 1/2 mile twice weekly.    Goals Addressed   None   Depression Screen PHQ 2/9 Scores 05/09/2021 01/07/2021 10/02/2020 05/15/2020 02/14/2020 11/07/2019 04/04/2019  PHQ - 2 Score 0 0 0 0 0 0 0  PHQ- 9 Score 3 1 4 2 1 2 4     Fall Risk Fall Risk  05/09/2021 01/07/2021 10/02/2020 05/15/2020 04/04/2019  Falls in the past year? 0 0 0 0 0  Number falls in past yr: 0 - - - -  Injury with Fall? 0 - - - -  Follow up Follow up appointment - Falls evaluation completed Falls evaluation completed Falls evaluation completed    [place]:  Any stairs in or around the home? Yes  If so, are there any without handrails? Yes  Home free of loose throw rugs in walkways, pet beds, electrical cords, etc? Yes  Adequate lighting in your home to reduce risk of falls? Yes   ASSISTIVE DEVICES UTILIZED TO PREVENT FALLS:  Life alert? No  Use of a cane, walker or w/c? Yes  Grab bars in the bathroom? Yes  Shower chair or bench in shower? Yes  Elevated toilet seat or a handicapped toilet? Yes   TIMED UP AND GO:  Was the test performed? Yes .  Length of time to ambulate 10 feet: 12 sec.   Gait slow and steady without use of assistive device  Cognitive Function: wnl's     6CIT Screen 05/09/2021 04/04/2019 06/15/2018 11/04/2017  What Year? 0 points 0 points 0 points 0 points  What month? 0 points 0 points 0  points 0 points  What time? 0 points 0 points 0 points 0 points  Count back from 20 0 points 0 points 0 points 0 points  Months in reverse 0 points 0 points 0 points 0 points  Repeat phrase 0 points 2 points 0 points 0 points  Total Score 0 2 0 0    Immunizations Immunization History  Administered Date(s) Administered   Fluad Quad(high Dose 65+) 11/11/2020   Influenza, High Dose Seasonal PF 09/19/2018, 10/02/2019   Influenza-Unspecified 10/05/2017, 10/02/2019   PFIZER(Purple Top)SARS-COV-2 Vaccination 01/21/2020, 02/12/2020   Pneumococcal Conjugate-13 10/08/2014, 10/05/2017   Pneumococcal Polysaccharide-23 09/08/1999   Tdap 07/14/2016   Zoster Recombinat (Shingrix) 09/22/2018, 01/11/2019, 06/09/2019   Zoster, Live 09/05/2012    TDAP status: Up to date  Flu Vaccine status: Up to date  Pneumococcal vaccine status: Up to date  Covid-19 vaccine status: Completed vaccines  Qualifies for Shingles Vaccine? Yes   Zostavax completed Yes   Shingrix Completed?: Yes  Screening Tests Health Maintenance  Topic Date Due   PNA vac Low Risk Adult (2 of 2 - PPSV23) 10/05/2018   COVID-19 Vaccine (3 - Pfizer risk series) 03/11/2020   FOOT EXAM  11/06/2020   OPHTHALMOLOGY EXAM  06/10/2021   INFLUENZA VACCINE  06/23/2021   HEMOGLOBIN A1C  11/01/2021   TETANUS/TDAP  07/14/2026   DEXA SCAN  Completed   Zoster Vaccines- Shingrix  Completed   HPV VACCINES  Aged Out    Health Maintenance  Health Maintenance Due  Topic Date Due   PNA vac Low Risk Adult (2 of 2 - PPSV23) 10/05/2018   COVID-19 Vaccine (3 - Pfizer risk series) 03/11/2020   FOOT EXAM  11/06/2020    Colorectal cancer screening: Type of screening: Colonoscopy. Completed 01/18/20. Repeat  every 0 years  Mammogram status: Completed 12/16/2018. Repeat every yearPatient quit doing these  Bone Density status: Completed 10/12/2018. Results reflect: Bone density results: NORMAL. Repeat every 2 years.  Lung Cancer Screening:  (Low Dose CT Chest recommended if Age 54-80 years, 30 pack-year currently smoking OR have quit w/in 15years.) does not qualify.   Lung Cancer Screening Referral:   Additional Screening:  Hepatitis C Screening: does qualify; Completed Pt declined  Vision Screening: Recommended annual ophthalmology exams for early detection of glaucoma and other disorders of the eye. Is the patient up to date with their annual eye exam?  Yes  Who is the provider or what is the name of the office in which the patient attends annual eye exams? Dr. Kathlen Mody If pt is not established with a provider, would they like to be referred to a provider to establish care? No .   Dental Screening: Recommended annual dental exams for proper oral hygiene  Community Resource Referral / Chronic Care Management: CRR required this visit?  No   CCM required this visit?  No      Plan:  -Discussed most recent lab results which are essentially within normal limits or stable from prior. Total cholesterol is mildly low and bad cholesterol 44 (at goal<70), discussed with patient if total cholesterol continues to be low and LDL remains at goal will consider decreasing Vytorin. Will repeat lipid panel at follow up visit. -Recommend to continue to follow up with Gastroenterologist.  -Will defer bone density until patient is feeling well.  -Continue current medication regimen. -Follow up in 4 months for DM, HLD, HTN and FBW (lipid panel, cmp, a1c) few days before  I have personally reviewed and noted the following in the patient's chart:   Medical and social history Use of alcohol, tobacco or illicit drugs  Current medications and supplements including opioid prescriptions.  Functional ability and status Nutritional status Physical activity Advanced directives List of other physicians Hospitalizations, surgeries, and ER visits in previous 12 months Vitals Screenings to include cognitive, depression, and falls Referrals and  appointments  In addition, I have reviewed and discussed with patient certain preventive protocols, quality metrics, and best practice recommendations. A written personalized care plan for preventive services as well as general preventive health recommendations were provided to patient.

## 2021-05-14 ENCOUNTER — Encounter (HOSPITAL_COMMUNITY): Payer: Self-pay

## 2021-05-14 ENCOUNTER — Other Ambulatory Visit: Payer: Self-pay | Admitting: Physician Assistant

## 2021-05-14 ENCOUNTER — Other Ambulatory Visit: Payer: Self-pay

## 2021-05-14 ENCOUNTER — Ambulatory Visit (HOSPITAL_COMMUNITY)
Admission: RE | Admit: 2021-05-14 | Discharge: 2021-05-14 | Disposition: A | Discharge status: Home / Self Care | Payer: Medicare HMO | Source: Ambulatory Visit | Attending: Gastroenterology | Admitting: Gastroenterology

## 2021-05-14 DIAGNOSIS — E1159 Type 2 diabetes mellitus with other circulatory complications: Secondary | ICD-10-CM

## 2021-05-14 DIAGNOSIS — K6389 Other specified diseases of intestine: Secondary | ICD-10-CM | POA: Insufficient documentation

## 2021-05-14 DIAGNOSIS — K56699 Other intestinal obstruction unspecified as to partial versus complete obstruction: Secondary | ICD-10-CM | POA: Diagnosis present

## 2021-05-14 MED ORDER — IOHEXOL 300 MG/ML  SOLN
100.0000 mL | Freq: Once | INTRAMUSCULAR | Status: AC | PRN
  Administered 2021-05-14: 100 mL via INTRAVENOUS

## 2021-05-14 MED ORDER — SODIUM CHLORIDE (PF) 0.9 % IJ SOLN
INTRAMUSCULAR | Status: AC
  Filled 2021-05-14: qty 50

## 2021-05-14 NOTE — Telephone Encounter (Signed)
Tedra Coupe called from radiology regarding the CT order for today's appointment that needs to be signed.

## 2021-06-16 ENCOUNTER — Ambulatory Visit: Payer: Medicare HMO | Admitting: Dermatology

## 2021-06-17 ENCOUNTER — Encounter: Payer: Self-pay | Admitting: Dermatology

## 2021-06-17 ENCOUNTER — Other Ambulatory Visit: Payer: Self-pay

## 2021-06-17 ENCOUNTER — Ambulatory Visit (INDEPENDENT_AMBULATORY_CARE_PROVIDER_SITE_OTHER): Payer: Medicare HMO | Admitting: Dermatology

## 2021-06-17 DIAGNOSIS — H61001 Unspecified perichondritis of right external ear: Secondary | ICD-10-CM

## 2021-06-17 DIAGNOSIS — Z1283 Encounter for screening for malignant neoplasm of skin: Secondary | ICD-10-CM

## 2021-06-17 NOTE — Patient Instructions (Signed)
Chondrodermatitis of the right ear. Can ask the ENT about removal because lesion is very painful.

## 2021-06-19 ENCOUNTER — Telehealth: Payer: Self-pay | Admitting: Gastroenterology

## 2021-06-19 NOTE — Telephone Encounter (Signed)
Patient is having another episode of vomiting and bloating about 2 nights ago. Patient states she was supposed to be getting a medication from a [employer] and has not received it yet.

## 2021-06-19 NOTE — Telephone Encounter (Signed)
Left message on machine to call back  

## 2021-06-20 MED ORDER — METRONIDAZOLE 500 MG PO TABS: 500.0000 mg | ORAL_TABLET | Freq: Three times a day (TID) | ORAL | 0 refills | Status: AC

## 2021-06-20 NOTE — Telephone Encounter (Signed)
The pt has been given the phone number to call  in regards to [place]. [phone]    Flagyl sent as stated below.  Flqgyl 500 mg tablet One tablet three times daily x 10 days Disp# 30, RF 0  The pt has been advised, she will begin prescription for flagyl and will begin xifaxan as soon as received.

## 2021-06-20 NOTE — Telephone Encounter (Signed)
Pt has vomiting with a heavy bloating feeling. She says she is not nauseous but has to vomit to relieve that feeling.. She has not been taking xifaxan she says she never received it in the mail.  She did get a call from [facility] who told her that it was being mailed but she never received it. She states that flagyl is the only thing that works for her.  Dr Loletha Carrow can you please review?

## 2021-06-20 NOTE — Telephone Encounter (Signed)
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  Flqgyl 500 mg tablet One tablet three times daily x 10 days Disp# 30, RF 0   See prior notes - this patient needs to be on long term rifaximin.  Please call her insurance and/or pharmacy today to find out why she has not rec'd it and how quickly they can get it to her.   - HD

## 2021-06-30 ENCOUNTER — Other Ambulatory Visit: Payer: Self-pay | Admitting: Physician Assistant

## 2021-06-30 DIAGNOSIS — E785 Hyperlipidemia, unspecified: Secondary | ICD-10-CM

## 2021-07-02 LAB — HM DIABETES EYE EXAM

## 2021-07-04 ENCOUNTER — Encounter: Payer: Self-pay | Admitting: Dermatology

## 2021-07-04 NOTE — Progress Notes (Signed)
   Follow-Up Visit   Subjective  Andrea Brooks is a 85 y.o. female who presents for the following: Annual Exam (Right ear pervious biopsy chondrodermatitis by JCB 2019 nonhealing lesion tender keeping her up at night ).  Persistent pain right upper ear; biopsy in 2019 showed CNH. Location:  Duration:  Quality:  Associated Signs/Symptoms: Modifying Factors:  Severity:  Timing: Context:   Objective  Well appearing patient in no apparent distress; mood and affect are within normal limits. No atypical pigmented lesions back or sun exposed areas.  Right Ear Lesion is very painful.  5 mm tender eroded deep dermal to cartilaginous papule.  This remains compatible with the previous biopsy of chondrodermatitis nodulari.    All sun exposed areas plus back examined.   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  We discussed conservative versus wedge excision, with the latter being more successful.  Her level of discomfort justifies the larger procedure; gradual with her ENT doctor.  Encounter for screening for malignant neoplasm of skin  Annual skin examination.      I, Andrea Monarch, MD, have reviewed all documentation for this visit.  The documentation on 07/04/21 for the exam, diagnosis, procedures, and orders are all accurate and complete.

## 2021-07-10 ENCOUNTER — Ambulatory Visit (INDEPENDENT_AMBULATORY_CARE_PROVIDER_SITE_OTHER): Payer: Medicare HMO | Admitting: Gastroenterology

## 2021-07-10 ENCOUNTER — Encounter: Payer: Self-pay | Admitting: Gastroenterology

## 2021-07-10 VITALS — BP 150/70 | HR 73 | Ht 64.0 in | Wt 145.0 lb

## 2021-07-10 DIAGNOSIS — K219 Gastro-esophageal reflux disease without esophagitis: Secondary | ICD-10-CM | POA: Diagnosis not present

## 2021-07-10 DIAGNOSIS — K6389 Other specified diseases of intestine: Secondary | ICD-10-CM | POA: Diagnosis not present

## 2021-07-10 MED ORDER — XIFAXAN 550 MG PO TABS
550.0000 mg | ORAL_TABLET | Freq: Three times a day (TID) | ORAL | 0 refills | Status: DC
Start: 2021-07-10 — End: 2021-10-10

## 2021-07-10 MED ORDER — OMEPRAZOLE 20 MG PO CPDR: DELAYED_RELEASE_CAPSULE | ORAL | 3 refills | Status: DC

## 2021-07-10 NOTE — Patient Instructions (Addendum)
We have sent the following medications to your pharmacy for you to pick up at your convenience: Omeprazole 20 mg daily 30-60 minutes before breakfast. ([facility]).  Continue Xifaxan three times daily ([facility]   If you are age 83 or older, your body mass index should be between 23-30. Your Body mass index is 24.89 kg/m. If this is out of the aforementioned range listed, please consider follow up with your Primary Care Provider.  If you are age 32 or younger, your body mass index should be between 19-25. Your Body mass index is 24.89 kg/m. If this is out of the aformentioned range listed, please consider follow up with your Primary Care Provider.   __________________________________________________________  The [facility] providers would like to encourage you to use [facility] to communicate with providers for non-urgent requests or questions.  Due to long hold times on the telephone, sending your provider a message by [facility] may be a faster and more efficient way to get a response.  Please allow 48 business hours for a response.  Please remember that this is for non-urgent requests.

## 2021-07-10 NOTE — Progress Notes (Signed)
07/10/2021 Andrea Brooks [id number] 03-15-1936   HISTORY OF PRESENT ILLNESS:  This is an 85 year old female who is a patient of Dr. Corena Pilgrim.  I saw her back in March at which time she had complaints of ongoing gas/bloating with alternating bowel habits.  All of her symptoms worsened following an episode of E. coli.  I suspected that she had SIBO.  I treated her with Xifaxan for 2 weeks and while she took that medication her symptoms greatly improved, but upon discontinuing it her symptoms returned within 2 days.  We then decided to proceed with CT scan of the abdomen and pelvis with contrast, which showed concern for bowel perforation and small bowel pneumatosis.  At urgent/emergent exploratory laparotomy all examined intestine was viable and no evidence of obstruction was seen.  There were unusual clusters of air/bubbles beneath the visceral peritoneum along the surface of the small bowel, mesentery, and omentum.  This is all thought to be possibly from chronic ongoing SIBO.  Since her hospital discharge she has been on and off of flagyl and xifaxan.  She just completed a 10 day course of flagyl and is now back on xifaxan 550 mg TID.  She does very well as long as she is on the antibiotics.  She is still waiting for an appt at [facility] for another opinion.  Takes omeprazole 20 mg daily for her heartburn/reflux issues.  Is asking for new prescription for that.   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   Left forearm and Left forearm superior   Past Surgical History:  Procedure Laterality Date   ABDOMINAL HYSTERECTOMY     total   BREAST BIOPSY Left    neg   CHOLECYSTECTOMY     JOINT  REPLACEMENT Bilateral    knee   LAPAROTOMY N/A 03/11/2021   Procedure: EXPLORATORY LAPAROTOMY;  Surgeon: Clovis Riley, MD;  Location: [place];  Service: General;  Laterality: N/A;   LEFT HEART CATH AND CORONARY ANGIOGRAPHY N/A 07/04/2018   Procedure: LEFT HEART CATH AND CORONARY ANGIOGRAPHY;  Surgeon: Jettie Booze, MD;  Location: [place] [facility];  Service: Cardiovascular;  Laterality: N/A;   REPLACEMENT TOTAL KNEE BILATERAL      reports that she quit smoking about 61 years ago. Her smoking use included cigarettes. She has a 2.00 pack-year smoking history. She has never used smokeless tobacco. She reports that she does not drink alcohol and does not use drugs. family history includes Bone cancer in her sister; Breast cancer in her maternal aunt and paternal aunt; Colon cancer in her mother; Berenice Primas' disease in her daughter and sister; Heart failure in her father; Hypertension in her mother; Stroke in her brother, father, and sister. Allergies  Allergen Reactions   Tape Rash and Other (See Comments)    PAPER TAPE ONLY!!!! NO CLEAR, PLASTIC TAPE- TEARS THE SKIN!!      Outpatient Encounter Medications as of 07/10/2021  Medication Sig   acetaminophen (TYLENOL) 500 MG tablet Take 1,000 mg by mouth every 6 (six) hours as needed for moderate pain.   alum & mag hydroxide-simeth (  MAALOX/MYLANTA) 200-200-20 MG/5ML suspension Take 30 mLs by mouth every 6 (six) hours as needed for indigestion or heartburn.   aspirin EC 81 MG tablet Take 81 mg by mouth daily.   calcium carbonate (TUMS - DOSED IN MG ELEMENTAL CALCIUM) 500 MG chewable tablet Chew 2 tablets by mouth as needed for indigestion or heartburn.   carvedilol (COREG) 6.25 MG tablet Take 1 tablet (6.25 mg total) by mouth 2 (two) times daily with a meal.   dicyclomine (BENTYL) 10 MG capsule Take 1 capsule (10 mg total) by mouth 3 (three) times daily as needed for spasms (IBS flareup). Future refills need to come from GI    ezetimibe-simvastatin (VYTORIN) 10-20 MG tablet TAKE 1 TABLET DAILY   hydrocortisone (ANUSOL-HC) 2.5 % rectal cream Place 1 application rectally 2 (two) times daily.   ipratropium (ATROVENT) 0.06 % nasal spray Place 2 sprays into both nostrils 2 (two) times daily.   LORazepam (ATIVAN) 1 MG tablet 1/2 tablet daily as needed for anxiety (Patient taking differently: Take 0.5-1 mg by mouth as needed for anxiety.)   metoCLOPramide (REGLAN) 5 MG tablet Take 1 tablet (5 mg total) by mouth every 8 (eight) hours as needed for nausea.   Multiple Vitamins-Minerals (MULTI FOR HER 50+) TABS Take 1 tablet by mouth daily.   omeprazole (PRILOSEC) 20 MG capsule Take 1 capsule by mouth daily, 30 minutes before breakfast.   ondansetron (ZOFRAN-ODT) 4 MG disintegrating tablet Take 4 mg by mouth as needed for nausea or vomiting.   Probiotic Product (SOLUBLE FIBER/PROBIOTICS) CHEW Chew 2 tablets by mouth daily.   valsartan (DIOVAN) 320 MG tablet TAKE 1 TABLET DAILY   XIFAXAN 550 MG TABS tablet Take 550 mg by mouth 3 (three) times daily.   No facility-administered encounter medications on file as of 07/10/2021.     REVIEW OF SYSTEMS  : All other systems reviewed and negative except where noted in the History of Present Illness.   PHYSICAL EXAM: BP (!) 150/70   Pulse 73   Ht '5\' 4"'$  (1.626 m)   Wt 145 lb (65.8 kg)   BMI 24.89 kg/m  General: Well developed white female in no acute distress Head: Normocephalic and atraumatic Eyes:  Sclerae anicteric, conjunctiva pink. Ears: Normal auditory acuity Lungs: Clear throughout to auscultation; no W/R/R. Heart: Regular rate and rhythm; no M/R/G. Abdomen: Soft, non-distended.  BS present.  Non-tender. Musculoskeletal: Symmetrical with no gross deformities  Skin: No lesions on visible extremities Extremities: No edema  Neurological: Alert oriented x 4, grossly non-focal Psychological:  Alert and cooperative. Normal mood and affect  ASSESSMENT AND PLAN: *85 year old  female with longstanding symptoms felt to be IBS related, possibly SIBO.  Improved significantly on Xifaxan, but recurred within a couple of days following treatment.  CT scan showed concern for bowel perforation and small bowel pneumatosis.  At urgent/emergent exploratory laparotomy all examined intestine was viable and no evidence of obstruction was seen.  There were unusual clusters of air/bubbles beneath the visceral peritoneum along the surface of the small bowel, mesentery, and omentum.  This is all thought to be possibly from chronic ongoing SIBO.  Has done well post-operatively as long as she is maintained on antibiotics.  She was just on flagyl for 10 days and now back on Xifaxan and is doing well.  She is waiting to be seen at [facility] for another opinion but does not have an appt yet.  I think that she should maintain on Xifaxan 550 mg TID ongoingly for now.  She will follow-up here again in about 8 weeks and certainly let us know sooner if she has another other problems despite the antibiotics. *GERD:  On omeprazole 20 mg daily.  Is asking for refill of 90 day supplies.  CC:  Lorrene Reid, PA-C

## 2021-07-11 ENCOUNTER — Encounter: Payer: Self-pay | Admitting: Physician Assistant

## 2021-07-17 NOTE — Progress Notes (Signed)
____________________________________________________________  Attending physician addendum:  Thank you for sending this case to me. I have reviewed the entire note and agree with the plan.  Since she is through a recent flare, I think twice daily rifaximin will be sufficient for maintenance therapy.  Wilfrid Lund, MD  ____________________________________________________________

## 2021-07-24 ENCOUNTER — Other Ambulatory Visit: Payer: Self-pay | Admitting: Unknown Physician Specialty

## 2021-07-29 LAB — SURGICAL PATHOLOGY

## 2021-08-06 LAB — HM DIABETES EYE EXAM

## 2021-08-12 ENCOUNTER — Other Ambulatory Visit: Payer: Self-pay | Admitting: Physician Assistant

## 2021-08-12 DIAGNOSIS — I152 Hypertension secondary to endocrine disorders: Secondary | ICD-10-CM

## 2021-08-13 ENCOUNTER — Encounter: Payer: Self-pay | Admitting: Physician Assistant

## 2021-08-27 ENCOUNTER — Other Ambulatory Visit: Payer: Self-pay | Admitting: Gastroenterology

## 2021-08-27 DIAGNOSIS — K219 Gastro-esophageal reflux disease without esophagitis: Secondary | ICD-10-CM

## 2021-09-04 ENCOUNTER — Other Ambulatory Visit: Payer: Self-pay

## 2021-09-04 DIAGNOSIS — E785 Hyperlipidemia, unspecified: Secondary | ICD-10-CM

## 2021-09-04 DIAGNOSIS — E119 Type 2 diabetes mellitus without complications: Secondary | ICD-10-CM

## 2021-09-04 DIAGNOSIS — E1159 Type 2 diabetes mellitus with other circulatory complications: Secondary | ICD-10-CM

## 2021-09-04 DIAGNOSIS — I152 Hypertension secondary to endocrine disorders: Secondary | ICD-10-CM

## 2021-09-05 ENCOUNTER — Other Ambulatory Visit: Payer: Self-pay

## 2021-09-05 ENCOUNTER — Other Ambulatory Visit: Payer: Medicare HMO

## 2021-09-05 DIAGNOSIS — E119 Type 2 diabetes mellitus without complications: Secondary | ICD-10-CM

## 2021-09-05 DIAGNOSIS — E1159 Type 2 diabetes mellitus with other circulatory complications: Secondary | ICD-10-CM

## 2021-09-05 DIAGNOSIS — I152 Hypertension secondary to endocrine disorders: Secondary | ICD-10-CM

## 2021-09-05 DIAGNOSIS — E785 Hyperlipidemia, unspecified: Secondary | ICD-10-CM

## 2021-09-06 LAB — LIPID PANEL
Chol/HDL Ratio: 3 ratio (ref 0.0–4.4)
Cholesterol, Total: 86 mg/dL — ABNORMAL LOW (ref 100–199)
HDL: 29 mg/dL — ABNORMAL LOW (ref >39)
LDL Chol Calc (NIH): 35 mg/dL (ref 0–99)
Triglycerides: 117 mg/dL (ref 0–149)
VLDL Cholesterol Cal: 22 mg/dL (ref 5–40)

## 2021-09-06 LAB — COMPREHENSIVE METABOLIC PANEL
ALT: 13 IU/L (ref 0–32)
AST: 21 IU/L (ref 0–40)
Albumin/Globulin Ratio: 1.2 (ref 1.2–2.2)
Albumin: 3.9 g/dL (ref 3.6–4.6)
Alkaline Phosphatase: 78 IU/L (ref 44–121)
BUN/Creatinine Ratio: 21 (ref 12–28)
BUN: 15 mg/dL (ref 8–27)
Bilirubin Total: 0.3 mg/dL (ref 0.0–1.2)
CO2: 23 mmol/L (ref 20–29)
Calcium: 9.2 mg/dL (ref 8.7–10.3)
Chloride: 104 mmol/L (ref 96–106)
Creatinine, Ser: 0.73 mg/dL (ref 0.57–1.00)
Globulin, Total: 3.2 g/dL (ref 1.5–4.5)
Glucose: 103 mg/dL — ABNORMAL HIGH (ref 70–99)
Potassium: 4.6 mmol/L (ref 3.5–5.2)
Sodium: 142 mmol/L (ref 134–144)
Total Protein: 7.1 g/dL (ref 6.0–8.5)
eGFR: 82 mL/min/{1.73_m2} (ref >59)

## 2021-09-06 LAB — HEMOGLOBIN A1C
Est. average glucose Bld gHb Est-mCnc: 111 mg/dL
Hgb A1c MFr Bld: 5.5 % (ref 4.8–5.6)

## 2021-09-11 ENCOUNTER — Ambulatory Visit: Payer: Medicare HMO | Admitting: Physician Assistant

## 2021-09-11 ENCOUNTER — Encounter: Payer: Self-pay | Admitting: Physician Assistant

## 2021-09-11 ENCOUNTER — Ambulatory Visit (INDEPENDENT_AMBULATORY_CARE_PROVIDER_SITE_OTHER): Payer: Medicare HMO | Admitting: Physician Assistant

## 2021-09-11 ENCOUNTER — Other Ambulatory Visit: Payer: Self-pay

## 2021-09-11 VITALS — BP 114/77 | HR 65 | Temp 97.8°F | Ht 64.0 in | Wt 141.0 lb

## 2021-09-11 DIAGNOSIS — E785 Hyperlipidemia, unspecified: Secondary | ICD-10-CM | POA: Diagnosis not present

## 2021-09-11 DIAGNOSIS — E119 Type 2 diabetes mellitus without complications: Secondary | ICD-10-CM | POA: Diagnosis not present

## 2021-09-11 DIAGNOSIS — E1159 Type 2 diabetes mellitus with other circulatory complications: Secondary | ICD-10-CM

## 2021-09-11 DIAGNOSIS — I152 Hypertension secondary to endocrine disorders: Secondary | ICD-10-CM | POA: Diagnosis not present

## 2021-09-11 NOTE — Progress Notes (Signed)
Established Patient Office Visit  Subjective:  Patient ID: Andrea Brooks, female    DOB: August 06, 1936  Age: 85 y.o. MRN: [id number]  CC:  Chief Complaint  Patient presents with   Follow-up   Diabetes   Hyperlipidemia   Hypertension    HPI Andrea Brooks presents for follow up on diabetes mellitus, hypertension and hyperlipidemia.  Diabetes: Pt denies increased urination or thirst. Pt reports medication compliance. No hypoglycemic events. Checking glucose at home. FBS range 80-99. Patient has chronic SIBO and states has been more careful about what she eats, has reduced carbohydrates including pasta.  HTN: Pt denies chest pain, palpitations, dizziness or leg swelling. Taking medication as directed without side effects.   HLD: Pt taking medication as directed without issues. Denies side effects including myalgias or muscle weakness. States her daughter has alpha-gal allergy and she does the cooking at home so has not been eating red meat.   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   Left forearm and Left forearm superior    Past Surgical History:  Procedure Laterality Date   ABDOMINAL HYSTERECTOMY     total   BREAST BIOPSY Left    neg   CHOLECYSTECTOMY     JOINT REPLACEMENT Bilateral    knee   LAPAROTOMY N/A 03/11/2021   Procedure: EXPLORATORY LAPAROTOMY;  Surgeon: Clovis Riley, MD;  Location: [place];  Service: General;  Laterality: N/A;   LEFT HEART CATH AND CORONARY ANGIOGRAPHY N/A 07/04/2018   Procedure: LEFT HEART CATH AND CORONARY ANGIOGRAPHY;  Surgeon: Jettie Booze, MD;  Location: [place] [facility];  Service: Cardiovascular;  Laterality: N/A;    REPLACEMENT TOTAL KNEE BILATERAL      Family History  Problem Relation Age of Onset   Hypertension Mother    Colon cancer Mother    Stroke Father    Heart failure Father    Stroke Sister    Stroke Brother    Graves' disease Daughter    Breast cancer Paternal Aunt    Graves' disease Sister    Bone cancer Sister    Breast cancer Maternal Aunt    Esophageal cancer Neg Hx    Rectal cancer Neg Hx    Liver cancer Neg Hx     Social History   Socioeconomic History   Marital status: Married    Spouse name: Not on file   Number of children: 1   Years of education: Not on file   Highest education level: Not on file  Occupational History   Occupation: retired  Tobacco Use   Smoking status: Former    Packs/day: 1.00    Years: 2.00    Pack years: 2.00    Types: Cigarettes    Quit date: 11/24/1959    Years since quitting: 61.8   Smokeless tobacco: Never  Vaping Use   Vaping Use: Never used  Substance and Sexual Activity   Alcohol use: No   Drug use: No   Sexual activity: Not Currently  Other Topics Concern   Not on file  Social History Narrative   Not on file  Social Determinants of Health   Financial Resource Strain: Not on file  Food Insecurity: Not on file  Transportation Needs: Not on file  Physical Activity: Not on file  Stress: Not on file  Social Connections: Not on file  Intimate Partner Violence: Not on file    Outpatient Medications Prior to Visit  Medication Sig Dispense Refill   acetaminophen (TYLENOL) 500 MG tablet Take 1,000 mg by mouth every 6 (six) hours as needed for moderate pain.     alum & mag hydroxide-simeth (MAALOX/MYLANTA) 200-200-20 MG/5ML suspension Take 30 mLs by mouth every 6 (six) hours as needed for indigestion or heartburn.     aspirin EC 81 MG tablet Take 81 mg by mouth daily.     calcium carbonate (TUMS - DOSED IN MG ELEMENTAL CALCIUM) 500 MG chewable tablet Chew 2 tablets by mouth as needed for indigestion or heartburn.      carvedilol (COREG) 6.25 MG tablet Take 1 tablet (6.25 mg total) by mouth 2 (two) times daily with a meal. 180 tablet 3   dicyclomine (BENTYL) 10 MG capsule Take 1 capsule (10 mg total) by mouth 3 (three) times daily as needed for spasms (IBS flareup). Future refills need to come from GI 30 capsule 1   ezetimibe-simvastatin (VYTORIN) 10-20 MG tablet TAKE 1 TABLET DAILY 90 tablet 0   hydrocortisone (ANUSOL-HC) 2.5 % rectal cream Place 1 application rectally 2 (two) times daily. 30 g 1   ipratropium (ATROVENT) 0.06 % nasal spray Place 2 sprays into both nostrils 2 (two) times daily.     LORazepam (ATIVAN) 1 MG tablet 1/2 tablet daily as needed for anxiety (Patient taking differently: Take 0.5-1 mg by mouth as needed for anxiety.) 30 tablet 0   metoCLOPramide (REGLAN) 5 MG tablet Take 1 tablet (5 mg total) by mouth every 8 (eight) hours as needed for nausea. 12 tablet 0   Multiple Vitamins-Minerals (MULTI FOR HER 50+) TABS Take 1 tablet by mouth daily.     omeprazole (PRILOSEC) 20 MG capsule TAKE 1 CAPSULE BY MOUTH DAILY, 30 MINUTES BEFORE BREAKFAST. 90 capsule 3   ondansetron (ZOFRAN-ODT) 4 MG disintegrating tablet Take 4 mg by mouth as needed for nausea or vomiting.     Probiotic Product (SOLUBLE FIBER/PROBIOTICS) CHEW Chew 2 tablets by mouth daily.     valsartan (DIOVAN) 320 MG tablet TAKE 1 TABLET DAILY 90 tablet 0   XIFAXAN 550 MG TABS tablet Take 1 tablet (550 mg total) by mouth 3 (three) times daily. 270 tablet 0   No facility-administered medications prior to visit.    Allergies  Allergen Reactions   Tape Rash and Other (See Comments)    PAPER TAPE ONLY!!!! NO CLEAR, PLASTIC TAPE- TEARS THE SKIN!!    ROS Review of Systems Review of Systems:  A fourteen system review of systems was performed and found to be positive as per HPI.   Objective:    Physical Exam General:  Well Developed, well nourished, appropriate for stated age.  Neuro:  Alert and oriented,  extra-ocular muscles  intact  HEENT:  Normocephalic, atraumatic, neck supple Skin:  no gross rash, warm, pink. Cardiac:  RRR, S1 S2 Respiratory: CTA B/L, Not using accessory muscles, speaking in full sentences- unlabored. Vascular:  Ext warm, no cyanosis apprec.; cap RF less 2 sec. No edema  Psych:  No HI/SI, judgement and insight good, Euthymic mood. Full Affect.  BP 114/77   Pulse 65   Temp 97.8 F (36.6 C)   Ht 5'  4" (1.626 m)   Wt 141 lb (64 kg)   SpO2 95%   BMI 24.20 kg/m  Wt Readings from Last 3 Encounters:  09/11/21 141 lb (64 kg)  07/10/21 145 lb (65.8 kg)  05/09/21 140 lb 12.8 oz (63.9 kg)     Health Maintenance Due  Topic Date Due   Pneumonia Vaccine 64+ Years old (3 - PPSV23 if available, else PCV20) 10/05/2018   COVID-19 Vaccine (3 - Pfizer risk series) 03/11/2020    There are no preventive care reminders to display for this patient.  Lab Results  Component Value Date   TSH 2.640 05/02/2021   Lab Results  Component Value Date   WBC 10.6 05/02/2021   HGB 13.0 05/02/2021   HCT 38.9 05/02/2021   MCV 90 05/02/2021   PLT 261 05/02/2021   Lab Results  Component Value Date   NA 142 09/05/2021   K 4.6 09/05/2021   CO2 23 09/05/2021   GLUCOSE 103 (H) 09/05/2021   BUN 15 09/05/2021   CREATININE 0.73 09/05/2021   BILITOT 0.3 09/05/2021   ALKPHOS 78 09/05/2021   AST 21 09/05/2021   ALT 13 09/05/2021   PROT 7.1 09/05/2021   ALBUMIN 3.9 09/05/2021   CALCIUM 9.2 09/05/2021   ANIONGAP 5 03/16/2021   EGFR 82 09/05/2021   GFR 72.50 07/13/2017   Lab Results  Component Value Date   CHOL 86 (L) 09/05/2021   Lab Results  Component Value Date   HDL 29 (L) 09/05/2021   Lab Results  Component Value Date   LDLCALC 35 09/05/2021   Lab Results  Component Value Date   TRIG 117 09/05/2021   Lab Results  Component Value Date   CHOLHDL 3.0 09/05/2021   Lab Results  Component Value Date   HGBA1C 5.5 09/05/2021      Assessment & Plan:   Problem List Items Addressed This  Visit       Cardiovascular and Mediastinum   Hypertension associated with diabetes ([place])    -Controlled. -Continue current medication regimen. Recent CMP, renal function and electrolytes normal. -Will continue to monitor.        Endocrine   Diabetes mellitus without complication ([place]) - Primary (Chronic)    -Diet controlled. -Recent A1c 5.5, will continue to monitor.        Other   Hyperlipidemia    -Discussed recent lipid panel, LDL 35. -Continue current medication regimen. Recent hepatic function normal. -If lipid panel continues to be well-controlled then recommend decreasing Vytorin to 10/10 mg. -Continue with low fat diet. -Will continue to monitor.       No orders of the defined types were placed in this encounter.   Follow-up: Return in about 6 months (around 03/12/2022) for DM, HLD, HTN .    Lorrene Reid, PA-C

## 2021-09-11 NOTE — Patient Instructions (Signed)
Diabetes Mellitus and Nutrition, Adult When you have diabetes, or diabetes mellitus, it is very important to have healthy eating habits because your blood sugar (glucose) levels are greatly affected by what you eat and drink. Eating healthy foods in the right amounts, at about the same times every day, can help you:  Control your blood glucose.  Lower your risk of heart disease.  Improve your blood pressure.  Reach or maintain a healthy weight. What can affect my meal plan? Every person with diabetes is different, and each person has different needs for a meal plan. Your health care provider may recommend that you work with a dietitian to make a meal plan that is best for you. Your meal plan may vary depending on factors such as:  The calories you need.  The medicines you take.  Your weight.  Your blood glucose, blood pressure, and cholesterol levels.  Your activity level.  Other health conditions you have, such as heart or kidney disease. How do carbohydrates affect me? Carbohydrates, also called carbs, affect your blood glucose level more than any other type of food. Eating carbs naturally raises the amount of glucose in your blood. Carb counting is a method for keeping track of how many carbs you eat. Counting carbs is important to keep your blood glucose at a healthy level, especially if you use insulin or take certain oral diabetes medicines. It is important to know how many carbs you can safely have in each meal. This is different for every person. Your dietitian can help you calculate how many carbs you should have at each meal and for each snack. How does alcohol affect me? Alcohol can cause a sudden decrease in blood glucose (hypoglycemia), especially if you use insulin or take certain oral diabetes medicines. Hypoglycemia can be a life-threatening condition. Symptoms of hypoglycemia, such as sleepiness, dizziness, and confusion, are similar to symptoms of having too much  alcohol.  Do not drink alcohol if: ? Your health care provider tells you not to drink. ? You are pregnant, may be pregnant, or are planning to become pregnant.  If you drink alcohol: ? Do not drink on an empty stomach. ? Limit how much you use to:  0-1 drink a day for women.  0-2 drinks a day for men. ? Be aware of how much alcohol is in your drink. In the [place] one drink equals one 12 oz bottle of beer (355 mL), one 5 oz glass of wine (148 mL), or one 1 oz glass of hard liquor (44 mL). ? Keep yourself hydrated with water, diet soda, or unsweetened iced tea.  Keep in mind that regular soda, juice, and other mixers may contain a lot of sugar and must be counted as carbs. What are tips for following this plan? Reading food labels  Start by checking the serving size on the "Nutrition Facts" label of packaged foods and drinks. The amount of calories, carbs, fats, and other nutrients listed on the label is based on one serving of the item. Many items contain more than one serving per package.  Check the total grams (g) of carbs in one serving. You can calculate the number of servings of carbs in one serving by dividing the total carbs by 15. For example, if a food has 30 g of total carbs per serving, it would be equal to 2 servings of carbs.  Check the number of grams (g) of saturated fats and trans fats in one serving. Choose foods that have   a low amount or none of these fats.  Check the number of milligrams (mg) of salt (sodium) in one serving. Most people should limit total sodium intake to less than 2,300 mg per day.  Always check the nutrition information of foods labeled as "low-fat" or "nonfat." These foods may be higher in added sugar or refined carbs and should be avoided.  Talk to your dietitian to identify your daily goals for nutrients listed on the label. Shopping  Avoid buying canned, pre-made, or processed foods. These foods tend to be high in fat, sodium, and added  sugar.  Shop around the outside edge of the grocery store. This is where you will most often find fresh fruits and vegetables, bulk grains, fresh meats, and fresh dairy. Cooking  Use low-heat cooking methods, such as baking, instead of high-heat cooking methods like deep frying.  Cook using healthy oils, such as olive, canola, or sunflower oil.  Avoid cooking with butter, cream, or high-fat meats. Meal planning  Eat meals and snacks regularly, preferably at the same times every day. Avoid going long periods of time without eating.  Eat foods that are high in fiber, such as fresh fruits, vegetables, beans, and whole grains. Talk with your dietitian about how many servings of carbs you can eat at each meal.  Eat 4-6 oz (112-168 g) of lean protein each day, such as lean meat, chicken, fish, eggs, or tofu. One ounce (oz) of lean protein is equal to: ? 1 oz (28 g) of meat, chicken, or fish. ? 1 egg. ?  cup (62 g) of tofu.  Eat some foods each day that contain healthy fats, such as avocado, nuts, seeds, and fish.   What foods should I eat? Fruits Berries. Apples. Oranges. Peaches. Apricots. Plums. Grapes. Mango. Papaya. Pomegranate. Kiwi. Cherries. Vegetables Lettuce. Spinach. Leafy greens, including kale, chard, collard greens, and mustard greens. Beets. Cauliflower. Cabbage. Broccoli. Carrots. Green beans. Tomatoes. Peppers. Onions. Cucumbers. Brussels sprouts. Grains Whole grains, such as whole-wheat or whole-grain bread, crackers, tortillas, cereal, and pasta. Unsweetened oatmeal. Quinoa. Brown or wild rice. Meats and other proteins Seafood. Poultry without skin. Lean cuts of poultry and beef. Tofu. Nuts. Seeds. Dairy Low-fat or fat-free dairy products such as milk, yogurt, and cheese. The items listed above may not be a complete list of foods and beverages you can eat. Contact a dietitian for more information. What foods should I avoid? Fruits Fruits canned with  syrup. Vegetables Canned vegetables. Frozen vegetables with butter or cream sauce. Grains Refined white flour and flour products such as bread, pasta, snack foods, and cereals. Avoid all processed foods. Meats and other proteins Fatty cuts of meat. Poultry with skin. Breaded or fried meats. Processed meat. Avoid saturated fats. Dairy Full-fat yogurt, cheese, or milk. Beverages Sweetened drinks, such as soda or iced tea. The items listed above may not be a complete list of foods and beverages you should avoid. Contact a dietitian for more information. Questions to ask a health care provider  Do I need to meet with a diabetes educator?  Do I need to meet with a dietitian?  What number can I call if I have questions?  When are the best times to check my blood glucose? Where to find more information:  American Diabetes Association: diabetes.org  Academy of Nutrition and Dietetics: www.eatright.org  National Institute of Diabetes and Digestive and Kidney Diseases: www.niddk.nih.gov  Association of Diabetes Care and Education Specialists: www.diabeteseducator.org Summary  It is important to have healthy eating   habits because your blood sugar (glucose) levels are greatly affected by what you eat and drink.  A healthy meal plan will help you control your blood glucose and maintain a healthy lifestyle.  Your health care provider may recommend that you work with a dietitian to make a meal plan that is best for you.  Keep in mind that carbohydrates (carbs) and alcohol have immediate effects on your blood glucose levels. It is important to count carbs and to use alcohol carefully. This information is not intended to replace advice given to you by your health care provider. Make sure you discuss any questions you have with your health care provider. Document Revised: 10/17/2019 Document Reviewed: 10/17/2019 Elsevier Patient Education  2021 Elsevier Inc.  

## 2021-09-11 NOTE — Assessment & Plan Note (Signed)
-  Discussed recent lipid panel, LDL 35. -Continue current medication regimen. Recent hepatic function normal. -If lipid panel continues to be well-controlled then recommend decreasing Vytorin to 10/10 mg. -Continue with low fat diet. -Will continue to monitor.

## 2021-09-11 NOTE — Assessment & Plan Note (Signed)
-  Diet controlled. -Recent A1c 5.5, will continue to monitor.

## 2021-09-11 NOTE — Assessment & Plan Note (Signed)
-  Controlled. -Continue current medication regimen. -Recent CMP, renal function and electrolytes normal. -Will continue to monitor. 

## 2021-09-12 ENCOUNTER — Ambulatory Visit: Payer: Medicare HMO | Admitting: Physician Assistant

## 2021-09-17 ENCOUNTER — Ambulatory Visit (INDEPENDENT_AMBULATORY_CARE_PROVIDER_SITE_OTHER): Payer: Medicare HMO | Admitting: Gastroenterology

## 2021-09-17 ENCOUNTER — Encounter: Payer: Self-pay | Admitting: Gastroenterology

## 2021-09-17 VITALS — BP 136/64 | HR 64 | Ht 64.0 in | Wt 141.1 lb

## 2021-09-17 DIAGNOSIS — K5989 Other specified functional intestinal disorders: Secondary | ICD-10-CM | POA: Diagnosis not present

## 2021-09-17 DIAGNOSIS — K6389 Other specified diseases of intestine: Secondary | ICD-10-CM

## 2021-09-17 MED ORDER — METRONIDAZOLE 500 MG PO TABS: 500.0000 mg | ORAL_TABLET | Freq: Three times a day (TID) | ORAL | 0 refills | Status: DC

## 2021-09-17 NOTE — Patient Instructions (Addendum)
If you are age 83 or older, your body mass index should be between 23-30. Your Body mass index is 24.22 kg/m. If this is out of the aforementioned range listed, please consider follow up with your Primary Care Provider.  If you are age 75 or younger, your body mass index should be between 19-25. Your Body mass index is 24.22 kg/m. If this is out of the aformentioned range listed, please consider follow up with your Primary Care Provider.   ________________________________________________________  The [facility] providers would like to encourage you to use [facility] to communicate with providers for non-urgent requests or questions.  Due to long hold times on the telephone, sending your provider a message by [facility] may be a faster and more efficient way to get a response.  Please allow 48 business hours for a response.  Please remember that this is for non-urgent requests.  _______________________________________________________  We have sent the following medications to your pharmacy for you to pick up at your convenience: Flagyl   We will send your records to [facility]. They will reach out to you to schedule.  [facility] A Service of [facility] [address], [place], [place] [address] Local: [phone] Toll Free: [phone] Fax [phone]   It was a pleasure to see you today!  Thank you for trusting me with your gastrointestinal care!

## 2021-09-17 NOTE — Progress Notes (Signed)
Andrea Brooks  Chief Complaint: Abdominal pain and distention, SIBO  Subjective  History: From last PA office appointment 07/10/2021: "This is an 85 year old female who is a patient of Dr. Corena Pilgrim.  I saw her back in March at which time she had complaints of ongoing gas/bloating with alternating bowel habits.  All of her symptoms worsened following an episode of E. coli.  I suspected that she had SIBO.  I treated her with Xifaxan for 2 weeks and while she took that medication her symptoms greatly improved, but upon discontinuing it her symptoms returned within 2 days.  We then decided to proceed with CT scan of the abdomen and pelvis with contrast, which showed concern for bowel perforation and small bowel pneumatosis.  At urgent/emergent exploratory laparotomy all examined intestine was viable and no evidence of obstruction was seen.  There were unusual clusters of air/bubbles beneath the visceral peritoneum along the surface of the small bowel, mesentery, and omentum.  This is all thought to be possibly from chronic ongoing SIBO.  Since her hospital discharge she has been on and off of flagyl and xifaxan.  She just completed a 10 day course of flagyl and is now back on xifaxan 550 mg TID.  She does very well as long as she is on the antibiotics.  She is still waiting for an appt at [facility] for another opinion. "  Earlier this year and again after this most recent office visit I performed extensive chart review related to the patient's hospitalization and exploratory laparotomy.  Clinical impression is that she has chronic SIBO causing the changes seen on CT scan.  She is maintained on rifaximin 550 mg twice daily.  She has episodic flares of symptoms characterized by nausea and vomiting.  Andrea Brooks was here with her husband and a daughter today.  We had a lengthy visit over her concerns and questions. She continues to feel bloated much of the time, sometimes will have several small BMs  per day but often does not feel completely evacuated.  She still has episodic "flares" that are upper abdominal pressure followed by nausea and vomiting of undigested food several times.  He will typically pass within 12 to 18 hours and she will take Zofran or dicyclomine as needed. She is currently taking MiraLAX quarter capful a day.  She also feels that the metronidazole might work better than the rifaximin, but is not clear what has led them to that conclusion.  ROS: Cardiovascular:  no chest pain Respiratory: no dyspnea Fatigue The patient's Past Medical, Family and Social History were reviewed and are on file in the EMR. 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   Left forearm and Left forearm superior   Past Surgical History:  Procedure Laterality Date   ABDOMINAL HYSTERECTOMY     total   BREAST BIOPSY Left    neg   CHOLECYSTECTOMY     JOINT REPLACEMENT Bilateral    knee   LAPAROTOMY N/A 03/11/2021   Procedure: EXPLORATORY LAPAROTOMY;  Surgeon: Clovis Riley, MD;  Location: [place];  Service: General;  Laterality: N/A;   LEFT HEART CATH AND CORONARY ANGIOGRAPHY N/A 07/04/2018   Procedure: LEFT HEART CATH AND CORONARY ANGIOGRAPHY;  Surgeon: Jettie Booze, MD;  Location: [place] [facility];  Service: Cardiovascular;  Laterality: N/A;   REPLACEMENT TOTAL KNEE BILATERAL      Objective:  Med list reviewed  Current Outpatient Medications:    acetaminophen (TYLENOL) 500 MG tablet, Take 1,000 mg by mouth every 6 (six) hours as needed for moderate pain., Disp: , Rfl:    alum & mag hydroxide-simeth (MAALOX/MYLANTA) 200-200-20 MG/5ML suspension, Take 30 mLs by mouth  every 6 (six) hours as needed for indigestion or heartburn., Disp: , Rfl:    aspirin EC 81 MG tablet, Take 81 mg by mouth daily., Disp: , Rfl:    calcium carbonate (TUMS - DOSED IN MG ELEMENTAL CALCIUM) 500 MG chewable tablet, Chew 2 tablets by mouth as needed for indigestion or heartburn., Disp: , Rfl:    carvedilol (COREG) 6.25 MG tablet, Take 1 tablet (6.25 mg total) by mouth 2 (two) times daily with a meal., Disp: 180 tablet, Rfl: 3   dicyclomine (BENTYL) 10 MG capsule, Take 1 capsule (10 mg total) by mouth 3 (three) times daily as needed for spasms (IBS flareup). Future refills need to come from GI, Disp: 30 capsule, Rfl: 1   ezetimibe-simvastatin (VYTORIN) 10-20 MG tablet, TAKE 1 TABLET DAILY, Disp: 90 tablet, Rfl: 0   hydrocortisone (ANUSOL-HC) 2.5 % rectal cream, Place 1 application rectally 2 (two) times daily., Disp: 30 g, Rfl: 1   ipratropium (ATROVENT) 0.06 % nasal spray, Place 2 sprays into both nostrils 2 (two) times daily., Disp: , Rfl:    LORazepam (ATIVAN) 1 MG tablet, 1/2 tablet daily as needed for anxiety (Patient taking differently: Take 0.5-1 mg by mouth as needed for anxiety.), Disp: 30 tablet, Rfl: 0   metoCLOPramide (REGLAN) 5 MG tablet, Take 1 tablet (5 mg total) by mouth every 8 (eight) hours as needed for nausea., Disp: 12 tablet, Rfl: 0   metroNIDAZOLE (FLAGYL) 500 MG tablet, Take 1 tablet (500 mg total) by mouth 3 (three) times daily., Disp: 42 tablet, Rfl: 0   Multiple Vitamins-Minerals (MULTI FOR HER 50+) TABS, Take 1 tablet by mouth daily., Disp: , Rfl:    omeprazole (PRILOSEC) 20 MG capsule, TAKE 1 CAPSULE BY MOUTH DAILY, 30 MINUTES BEFORE BREAKFAST., Disp: 90 capsule, Rfl: 3   ondansetron (ZOFRAN-ODT) 4 MG disintegrating tablet, Take 4 mg by mouth as needed for nausea or vomiting., Disp: , Rfl:    valsartan (DIOVAN) 320 MG tablet, TAKE 1 TABLET DAILY, Disp: 90 tablet, Rfl: 0   XIFAXAN 550 MG TABS tablet, Take 1 tablet (550 mg total) by mouth 3 (three) times daily.,  Disp: 270 tablet, Rfl: 0   Vital signs in last 24 hrs: Vitals:   09/17/21 1458  BP: 136/64  Pulse: 64   Wt Readings from Last 3 Encounters:  09/17/21 141 lb 2 oz (64 kg)  09/11/21 141 lb (64 kg)  07/10/21 145 lb (65.8 kg)    Weight down several pounds from 2 months ago  Physical Exam  Not acutely ill-appearing.  Ambulatory, gets on exam table slowly but without assistance. HEENT: sclera anicteric, oral mucosa moist without lesions Neck: supple, no thyromegaly, JVD or lymphadenopathy Cardiac: RRR without murmurs, S1S2 heard, no peripheral edema Pulm: clear to auscultation bilaterally, normal RR and effort noted Abdomen: soft, no tenderness, nondistended, but tympanitic Skin; warm and dry, no jaundice or rash  Labs:  ___________________________________________ Radiologic studies:  CLINICAL DATA:  Pneumatosis intestinalis, laparotomy   EXAM: CT ABDOMEN AND PELVIS WITH CONTRAST   TECHNIQUE: Multidetector CT imaging of the abdomen and pelvis was performed using the standard protocol following bolus administration of intravenous contrast.   CONTRAST:  143mL OMNIPAQUE IOHEXOL 300 MG/ML SOLN, additional oral enteric contrast   COMPARISON:  03/11/2021   FINDINGS: Lower chest: Redemonstrated irregular peripheral interstitial opacity at the lung bases.   Hepatobiliary: No focal liver abnormality is seen. Status post cholecystectomy. Postoperative biliary dilatation.   Pancreas: Unremarkable. No pancreatic ductal dilatation or surrounding inflammatory changes.   Spleen: Normal in size without significant abnormality.   Adrenals/Urinary Tract: Adrenal glands are unremarkable. Kidneys are normal, without renal calculi, solid lesion, or hydronephrosis. Bladder is unremarkable.   Stomach/Bowel: Stomach is within normal limits. Appendix appears normal. There are redemonstrated fluid and succus filled loops of small bowel throughout the abdomen, which are generally  decreased in caliber compared to prior examination and not overtly distended. There is persistent, scattered pneumatosis of the bowel and numerous small locules of free air throughout the abdomen, somewhat diminished compared to prior examination. Sigmoid diverticulosis.   Vascular/Lymphatic: Aortic atherosclerosis. No enlarged abdominal or pelvic lymph nodes.   Reproductive: Status post hysterectomy.   Other: No abdominal wall hernia or abnormality. No abdominopelvic ascites.   Musculoskeletal: No acute or significant osseous findings.   IMPRESSION: 1. There are redemonstrated fluid and succus filled loops of small bowel throughout the abdomen, which are generally decreased in caliber compared to prior examination and not overtly distended at present. 2. There is persistent, scattered pneumatosis of the bowel and numerous small locules of free air throughout the abdomen, somewhat diminished compared to prior examination. This remains of uncertain etiology. 3. Status post hysterectomy and cholecystectomy. 4. 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   Aortic Atherosclerosis (ICD10-I70.0).     Electronically Signed   By: Eddie Candle M.D.   On: 05/15/2021 12:05  (CT images reviewed, and she has impressive diffuse small bowel dilatation with fecalization) ____________________________________________ Other:   _____________________________________________ Assessment & Plan  Assessment: Encounter Diagnoses  Name Primary?   Small intestinal bacterial overgrowth (SIBO) Yes   Intestinal pseudoobstruction    This remains a puzzling case.  She had a diagnostic laparotomy earlier this year when CT scan showed pneumatosis and there was concern for ischemic bowel.  However, she had diffusely dilated small bowel with no clear focal  transition point, bubbles of air in many areas of the serosal surface coinciding with that seen on CT scan, however no ischemic areas, therefore no resection done.  No full-thickness biopsy was done either.  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  I did my best to explain how this is poor intestinal motility, leading to episodes of what seem like bowel obstruction when intestinal contents are backing up and causing these "flares.  I believe she does have SIBO, but suspect that is secondary to the underlying dysmotility.  I do not know how the pneumatosis intestinalis is related, though perhaps from SIBO(?)  Plan: I recommended she increase MiraLAX to half capful a day to try and empty the small bowel more regularly, and if she has flares as noted above, to take her Zofran and then increase her MiraLAX to full capful a day for 2 or 3  days.  They asked for a short course of metronidazole, since it might make her feel better for short periods, and [place]. I put her on 500 mg twice daily for 14 days.  I cautioned them against the long-term use of that medicine due to the risk of neuropathy.  I am referring this patient to an academic center for another opinion.  I have told Santiago Glad and her family upfront that this is a rare problem, and there is not likely to be any true expert that is seeing a large amount of this.  When she was seen at [facility] and was told she had Crohn's disease, she was advised to see Dr. Drema Dallas at [facility].  However, she now knows that she does not have IBD but still requests that it be [facility] who sees her.  We have sent a referral to the GI department, pertinent records will be sent, and I can communicate directly with whichever physician will be seeing her.  I do not know if further testing can be any more revealing for the underlying cause of this diffuse small bowel  dysmotility or what available treatment there might be.  42 minutes were spent on this encounter (including extensive chart review, history/exam, counseling/coordination of care, and documentation) > 50% of that time was spent on counseling and coordination of care.   Nelida Meuse III

## 2021-09-26 ENCOUNTER — Telehealth: Payer: Self-pay

## 2021-09-26 NOTE — Telephone Encounter (Signed)
On 09-19-2021 referral was faxed to [place]  Will await appointment info

## 2021-09-29 ENCOUNTER — Other Ambulatory Visit: Payer: Self-pay | Admitting: Physician Assistant

## 2021-09-29 DIAGNOSIS — E785 Hyperlipidemia, unspecified: Secondary | ICD-10-CM

## 2021-10-02 NOTE — Telephone Encounter (Signed)
I called to see if this patient had been contacted by [facility]. She has not as of yet got a cal to schedule. She wanted me to give you her update, she is doing great on the Flagyl and is terrified to stop it as she is coming to the end of her treatment. She requested to extend her treatment with the Flagyl. Please advise.

## 2021-10-03 MED ORDER — METRONIDAZOLE 250 MG PO TABS
250.0000 mg | ORAL_TABLET | Freq: Three times a day (TID) | ORAL | 0 refills | Status: DC
Start: 2021-10-03 — End: 2021-10-10

## 2021-10-03 NOTE — Telephone Encounter (Signed)
Patient has been notified and aware of the new Rx of Flagyl at 250mg  TID dosing.

## 2021-10-03 NOTE — Telephone Encounter (Signed)
I am willing to extend the course of metronidazole, but we discussed in the office that we must be cautious with long term use due to risk of neuropathy.  Since the current course has made her feel better and gotten this episode of SIBO under better control, I will lower the dose and give it for another 14 day course , after which she should  return to rifaximin.  Metronidazole 250 mg One tablet three times daily x 14 days Disp#42 tablets  RF zero  - HD

## 2021-10-07 ENCOUNTER — Other Ambulatory Visit: Payer: Self-pay | Admitting: Gastroenterology

## 2021-10-10 NOTE — Telephone Encounter (Signed)
Patient called in. States she is scheduled with [facility] Thursday 01/15/2022 @ 2:30pm with Dr. Sheliah Plane

## 2021-10-10 NOTE — Telephone Encounter (Signed)
Called [facility] they have the referral. Patient has to just call and schedule the appointment.  I have given the contact information to Mrs. Figler. She will let us know when her appointment is.

## 2021-10-15 ENCOUNTER — Other Ambulatory Visit: Payer: Self-pay | Admitting: Gastroenterology

## 2021-10-29 ENCOUNTER — Telehealth: Payer: Self-pay | Admitting: Gastroenterology

## 2021-10-29 NOTE — Telephone Encounter (Signed)
Inbound call from patient states she believe she is having a reaction to xifaxan. States after she takes the medication her fingers appears swollen

## 2021-10-29 NOTE — Telephone Encounter (Signed)
If she does have Raynaud's syndrome (which would be best addressed by her primary care provider), it is not known to occur from taking rifaximin.  Rifaximin only works within the digestive tract and is not systemically absorbed.  Of note, she has been on rifaximin longer than 3 weeks.  I had increased the rifaximin from 2 times a day to 3 times a day opening it might control her SIBO symptoms better.  If she prefers to take it twice a day, then the medicine can be refilled with that dosing.  She will need to be on this long-term so definitely needs a refill.  HD

## 2021-10-29 NOTE — Telephone Encounter (Signed)
Called and spoke with patient in regards to information. Pt states that she will go back to taking Rifaximin 2 times a day. Pt states that she does not need a refill at this time. Pt advised to call us back once she gets close to the end of her prescription so that we can send in a refill for Rifaximin BID. Pt will follow up with her PCP for evaluation of Raynaud's syndrome. Pt verbalized understanding and had no concerns at the end of the call.

## 2021-10-29 NOTE — Telephone Encounter (Signed)
Called and spoke with patient. Pt reports that she had cataract surgery yesterday and the anesthesiologist questioned if she had Raynaud's syndrome because her fingertips were light blue. Pt states that she didn't think anything of it before but it did start happening when she started the Xifaxan which was about 3-weeks ago. Pt states that her fingertips are a blueish white and has some numbness to the fingertips several hours after taking Xifaxan. Pt reports that her symptoms usually resolve on their own within a few hours. Pt states that she has not noticed any specific correlation. She states that is happens sporadically. She denies any new medications. She states that she has about a month supply left if Xifaxan. Pt wondered if this dose was too high, she states that she was previously on 1 tablet twice a day. Please advise, thanks.

## 2021-11-10 ENCOUNTER — Other Ambulatory Visit: Payer: Self-pay | Admitting: Physician Assistant

## 2021-11-10 DIAGNOSIS — I152 Hypertension secondary to endocrine disorders: Secondary | ICD-10-CM

## 2021-12-01 ENCOUNTER — Telehealth: Payer: Self-pay | Admitting: Gastroenterology

## 2021-12-01 LAB — HM DIABETES EYE EXAM

## 2021-12-01 MED ORDER — ONDANSETRON 4 MG PO TBDP: 4.0000 mg | ORAL_TABLET | ORAL | 1 refills | Status: DC | PRN

## 2021-12-01 MED ORDER — METRONIDAZOLE 250 MG PO TABS: 250.0000 mg | ORAL_TABLET | Freq: Three times a day (TID) | ORAL | 0 refills | Status: DC

## 2021-12-01 NOTE — Telephone Encounter (Signed)
Thank you for the note.  I understand how difficult this has been for them.  To clarify, vomiting is from the pseudoobstruction for which she will be evaluated at [facility], and less so from the secondary bacterial overgrowth condition.  Therefore, although they feel that metronidazole works better to control her symptoms than does the rifaximin, even long-term use of metronidazole is not likely to stop this vomiting from occurring.  I will give her 4 weeks of low-dose metronidazole in hopes it may improve her symptoms enough for better quality of life during her month-long trip. (We discussed at the office visit the risk of peripheral neuropathy with long-term metronidazole use)  Metronidazole 250 mg tablet One tablet three times daily x 30 days Disp#90 tablets, zero refill  Remember to refill her ondansetron if needed and bring it on the trip.  - HD

## 2021-12-01 NOTE — Telephone Encounter (Signed)
Called and spoke with Hassan Rowan in regards to Dr. Loletha Carrow recommendations. Hassan Rowan voiced understanding about the risks of the long-term use of Flagyl. She would like that RX and a refill of Zofran sent to [facility] on file. Hassan Rowan verbalized understanding of all information and had no concerns at the end of the call.

## 2021-12-01 NOTE — Telephone Encounter (Signed)
Patient's daughter called requesting a refill of Flagyl.  Her mother is still throwing up every other night.  They are leaving for [place] for a month and she wouldn't be able to take her with her vomiting like she is.  The mother has an appointment in [place] at the end of February.  They know it's not good to keep her on Flagyl long at the time, but say it is the only thing that seems to work for her.  Please call patient's daughter, Hassan Rowan, and discuss and let her know if you'll be able to prescribe the Flagyl for her mother.  Thank you.

## 2021-12-02 ENCOUNTER — Encounter: Payer: Self-pay | Admitting: Physician Assistant

## 2022-03-13 ENCOUNTER — Ambulatory Visit (INDEPENDENT_AMBULATORY_CARE_PROVIDER_SITE_OTHER): Payer: Medicare HMO | Admitting: Physician Assistant

## 2022-03-13 ENCOUNTER — Encounter: Payer: Self-pay | Admitting: Physician Assistant

## 2022-03-13 VITALS — BP 130/70 | HR 60 | Temp 98.0°F | Ht 64.0 in | Wt 121.0 lb

## 2022-03-13 DIAGNOSIS — E1159 Type 2 diabetes mellitus with other circulatory complications: Secondary | ICD-10-CM

## 2022-03-13 DIAGNOSIS — I152 Hypertension secondary to endocrine disorders: Secondary | ICD-10-CM

## 2022-03-13 DIAGNOSIS — K6389 Other specified diseases of intestine: Secondary | ICD-10-CM

## 2022-03-13 DIAGNOSIS — E119 Type 2 diabetes mellitus without complications: Secondary | ICD-10-CM | POA: Diagnosis not present

## 2022-03-13 DIAGNOSIS — E785 Hyperlipidemia, unspecified: Secondary | ICD-10-CM | POA: Diagnosis not present

## 2022-03-13 DIAGNOSIS — F419 Anxiety disorder, unspecified: Secondary | ICD-10-CM

## 2022-03-13 LAB — POCT GLYCOSYLATED HEMOGLOBIN (HGB A1C): Hemoglobin A1C: 5.2 % (ref 4.0–5.6)

## 2022-03-13 LAB — POCT UA - MICROALBUMIN
Albumin/Creatinine Ratio, Urine, POC: <30
Creatinine, POC: 50 mg/dL
Microalbumin Ur, POC: 10 mg/L

## 2022-03-13 MED ORDER — LORAZEPAM 1 MG PO TABS: ORAL_TABLET | ORAL | 0 refills | Status: DC

## 2022-03-13 NOTE — Patient Instructions (Signed)
Managing Stress, Adult ?Feeling a certain amount of stress is normal. Stress helps our body and mind get ready to deal with the demands of life. Stress hormones can motivate you to do well at work and meet your responsibilities. But severe or long-term (chronic) stress can affect your mental and physical health. Chronic stress puts you at higher risk for: ?Anxiety and depression. ?Other health problems such as digestive problems, muscle aches, heart disease, high blood pressure, and stroke. ?What are the causes? ?Common causes of stress include: ?Demands from work, such as deadlines, feeling overworked, or having long hours. ?Pressures at home, such as money issues, disagreements with a spouse, or parenting issues. ?Pressures from major life changes, such as divorce, moving, loss of a loved one, or chronic illness. ?You may be at higher risk for stress-related problems if you: ?Do not get enough sleep. ?Are in poor health. ?Do not have emotional support. ?Have a mental health disorder such as anxiety or depression. ?How to recognize stress ?Stress can make you: ?Have trouble sleeping. ?Feel sad, anxious, irritable, or overwhelmed. ?Lose your appetite. ?Overeat or want to eat unhealthy foods. ?Want to use drugs or alcohol. ?Stress can also cause physical symptoms, such as: ?Sore, tense muscles, especially in the shoulders and neck. ?Headaches. ?Trouble breathing. ?A faster heart rate. ?Stomach pain, nausea, or vomiting. ?Diarrhea or constipation. ?Trouble concentrating. ?Follow these instructions at home: ?Eating and drinking ?Eat a healthy diet. This includes: ?Eating foods that are high in fiber, such as beans, whole grains, and fresh fruits and vegetables. ?Limiting foods that are high in fat and processed sugars, such as fried or sweet foods. ?Do not skip meals or overeat. ?Drink enough fluid to keep your urine pale yellow. ?Alcohol use ?Do not drink alcohol if: ?Your health care provider tells you not to  drink. ?You are pregnant, may be pregnant, or are planning to become pregnant. ?Drinking alcohol is a way some people try to ease their stress. This can be dangerous, so if you drink alcohol: ?Limit how much you have to: ?0-1 drink a day for women. ?0-2 drinks a day for men. ?Know how much alcohol is in your drink. In the [place] one drink equals one 12 oz bottle of beer (355 mL), one 5 oz glass of wine (148 mL), or one 1? oz glass of hard liquor (44 mL). ?Activity ? ?Include 30 minutes of exercise in your daily schedule. Exercise is a good stress reducer. ?Include time in your day for an activity that you find relaxing. Try taking a walk, going on a bike ride, reading a book, or listening to music. ?Schedule your time in a way that lowers stress, and keep a regular schedule. Focus on doing what is most important to get done. ?Lifestyle ?Identify the source of your stress and your reaction to it. See a therapist who can help you change unhelpful reactions. ?When there are stressful events: ?Talk about them with family, friends, or coworkers. ?Try to think realistically about stressful events and not ignore them or overreact. ?Try to find the positives in a stressful situation and not focus on the negatives. ?Cut back on responsibilities at work and home, if possible. Ask for help from friends or family members if you need it. ?Find ways to manage stress, such as: ?Mindfulness, meditation, or deep breathing. ?Yoga or tai chi. ?Progressive muscle relaxation. ?Spending time in nature. ?Doing art, playing music, or reading. ?Making time for fun activities. ?Spending time with family and friends. ?Get support   from family, friends, or spiritual resources. General instructions Get enough sleep. Try to go to sleep and get up at about the same time every day. Take over-the-counter and prescription medicines only as told by your health care provider. Do not use any products that contain nicotine or tobacco. These products  include cigarettes, chewing tobacco, and vaping devices, such as e-cigarettes. If you need help quitting, ask your health care provider. Do not use drugs or smoke to deal with stress. Keep all follow-up visits. This is important. Where to find support Talk with your health care provider about stress management or finding a support group. Find a therapist to work with you on your stress management techniques. Where to find more information National Alliance on Mental Illness: www.nami.org American Psychological Association: www.apa.org Contact a health care provider if: Your stress symptoms get worse. You are unable to manage your stress at home. You are struggling to stop using drugs or alcohol. Get help right away if: You may be a danger to yourself or others. You have any thoughts of death or suicide. Get help right awayif you feel like you may hurt yourself or others, or have thoughts about taking your own life. Go to your nearest emergency room or: Call 911. Call the National Suicide Prevention Lifeline at 1-800-273-8255 or 988 in the [place] This is open 24 hours a day. Text the Crisis Text Line at 741741. Summary Feeling a certain amount of stress is normal, but severe or long-term (chronic) stress can affect your mental and physical health. Chronic stress can put you at higher risk for anxiety, depression, and other health problems such as digestive problems, muscle aches, heart disease, high blood pressure, and stroke. You may be at higher risk for stress-related problems if you do not get enough sleep, are in poor health, lack emotional support, or have a mental health disorder such as anxiety or depression. Identify the source of your stress and your reaction to it. Try talking about stressful events with family, friends, or coworkers, finding a coping method, or getting support from spiritual resources. If you need more help, talk with your health care provider about finding a  support group or a mental health therapist. This information is not intended to replace advice given to you by your health care provider. Make sure you discuss any questions you have with your health care provider. Document Revised: 06/05/2021 Document Reviewed: 06/03/2021 Elsevier Patient Education  2023 Elsevier Inc.  

## 2022-03-13 NOTE — Assessment & Plan Note (Signed)
-  BP stable. ?-Continue current medication regimen. Will repeat CMP with annual medicare wellness. Recent Creatinine and GFR wnl's. ?-Will continue to monitor. ?

## 2022-03-13 NOTE — Assessment & Plan Note (Signed)
-  Last lipid panel: HDL 29, LDL 35 ?-Continue Vytorin 10-20 mg.  ?-Will repeat lipid panel and hepatic function with annual medicare wellness. ?-Will continue to monitor. ?

## 2022-03-13 NOTE — Progress Notes (Signed)
?Established patient visit ? ? ?Patient: Andrea Brooks   DOB: 05/21/1936   86 y.o. Female  MRN: [id number] ?Visit Date: 03/13/2022 ? ?Chief Complaint  ?Patient presents with  ? Follow-up  ? Diabetes  ? Hyperlipidemia  ? Hypertension  ? ?Subjective  ?  ?HPI  ?Patient presents for follow-up on diabetes mellitus, hypertension and hyperlipidemia.  ? ?Diabetes: Pt denies increased urination or thirst. Pt reports medication compliance. No hypoglycemic events. Not checking glucose regularly. Reports diet limited due to pneumatosis intestinalis of small intestine which she started a new medication which has helped so far. ? ?HTN: Pt denies chest pain, palpitations, dizziness, shortness of breath or lower extremity swelling. Taking medication as directed without side effects. Tries to stay hydrated. ? ?HLD: Pt taking medication as directed without issues. No myalgias. Diet limited by pneumatosis and tries to avoid fatty/fried foods. ? ?Mood: Patient reports dealing with personal stress with her health and her sister's health. Has a good support system at home. Takes lorazepam only when feeling very overwhelmed or anxious.  ? ?Medications: ?Outpatient Medications Prior to Visit  ?Medication Sig  ? acetaminophen (TYLENOL) 500 MG tablet Take 1,000 mg by mouth every 6 (six) hours as needed for moderate pain.  ? alum & mag hydroxide-simeth (MAALOX/MYLANTA) 200-200-20 MG/5ML suspension Take 30 mLs by mouth every 6 (six) hours as needed for indigestion or heartburn.  ? aspirin EC 81 MG tablet Take 81 mg by mouth daily.  ? calcium carbonate (TUMS - DOSED IN MG ELEMENTAL CALCIUM) 500 MG chewable tablet Chew 2 tablets by mouth as needed for indigestion or heartburn.  ? carvedilol (COREG) 6.25 MG tablet Take 1 tablet (6.25 mg total) by mouth 2 (two) times daily with a meal.  ? dicyclomine (BENTYL) 10 MG capsule Take 1 capsule (10 mg total) by mouth 3 (three) times daily as needed for spasms (IBS flareup). Future refills need to come  from GI  ? ezetimibe-simvastatin (VYTORIN) 10-20 MG tablet TAKE 1 TABLET DAILY  ? hydrocortisone (ANUSOL-HC) 2.5 % rectal cream Place 1 application rectally 2 (two) times daily.  ? ipratropium (ATROVENT) 0.06 % nasal spray Place 2 sprays into both nostrils 2 (two) times daily.  ? metoCLOPramide (REGLAN) 5 MG tablet Take 1 tablet (5 mg total) by mouth every 8 (eight) hours as needed for nausea.  ? metroNIDAZOLE (FLAGYL) 250 MG tablet Take 1 tablet (250 mg total) by mouth 3 (three) times daily.  ? Multiple Vitamins-Minerals (MULTI FOR HER 50+) TABS Take 1 tablet by mouth daily.  ? omeprazole (PRILOSEC) 20 MG capsule TAKE 1 CAPSULE BY MOUTH DAILY, 30 MINUTES BEFORE BREAKFAST.  ? ondansetron (ZOFRAN-ODT) 4 MG disintegrating tablet Take 1 tablet (4 mg total) by mouth as needed for nausea or vomiting.  ? valsartan (DIOVAN) 320 MG tablet TAKE 1 TABLET DAILY  ? [DISCONTINUED] LORazepam (ATIVAN) 1 MG tablet 1/2 tablet daily as needed for anxiety (Patient taking differently: Take 0.5-1 mg by mouth as needed for anxiety.)  ? [DISCONTINUED] XIFAXAN 550 MG TABS tablet TAKE 1 TABLET BY MOUTH 3 TIMES DAILY.  ? ?No facility-administered medications prior to visit.  ? ? ?  03/13/2022  ? 11:10 AM 09/11/2021  ?  9:50 AM 05/09/2021  ? 11:02 AM 01/07/2021  ? 11:08 AM 10/02/2020  ?  2:33 PM  ?Depression screen PHQ 2/9  ?Decreased Interest 0 0 0 0 0  ?Down, Depressed, Hopeless 0 0 0 0 0  ?PHQ - 2 Score 0 0 0 0 0  ?Altered sleeping  $'1 1 1 1 3  'f$ ?Tired, decreased energy '1 1 1 '$ 0 1  ?Change in appetite 1 0 1 0 0  ?Feeling bad or failure about yourself  0 0 0 0 0  ?Trouble concentrating 0 0 0 0 0  ?Moving slowly or fidgety/restless 0 0 0 0 0  ?Suicidal thoughts 0 0 0 0 0  ?PHQ-9 Score '3 2 3 1 4  '$ ?Difficult doing work/chores Somewhat difficult Not difficult at all Somewhat difficult  Not difficult at all  ? ? ?  03/13/2022  ? 11:10 AM 09/11/2021  ?  9:50 AM 05/09/2021  ? 11:03 AM  ?GAD 7 : Generalized Anxiety Score  ?Nervous, Anxious, on Edge '1 1 1   '$ ?Control/stop worrying 1 1 0  ?Worry too much - different things 1 1 0  ?Trouble relaxing 0 1 0  ?Restless 0 0 0  ?Easily annoyed or irritable 0 0 0  ?Afraid - awful might happen 0 0 0  ?Total GAD 7 Score '3 4 1  '$ ?Anxiety Difficulty Not difficult at all Not difficult at all Somewhat difficult  ? ? ? ? ?Review of Systems ?Review of Systems:  ?A fourteen system review of systems was performed and found to be positive as per HPI. ? ? ?  Objective  ?  ?BP 130/70   Pulse 60   Temp 98 ?F (36.7 ?C)   Ht '5\' 4"'$  (1.626 m)   Wt 121 lb (54.9 kg)   SpO2 98%   BMI 20.77 kg/m?  ?BP Readings from Last 3 Encounters:  ?03/13/22 130/70  ?09/17/21 136/64  ?09/11/21 114/77  ? ?Wt Readings from Last 3 Encounters:  ?03/13/22 121 lb (54.9 kg)  ?09/17/21 141 lb 2 oz (64 kg)  ?09/11/21 141 lb (64 kg)  ? ? ?Physical Exam  ?General:  Well Developed, well nourished, pleasant and cooperative. ?Neuro:  Alert and oriented,  extra-ocular muscles intact  ?HEENT:  Normocephalic, atraumatic, neck supple  ?Skin:  no gross rash, warm, pink. ?Cardiac:  RRR, S1 S2 ?Respiratory: CTA B/L  ?Vascular:  Ext warm, no cyanosis apprec.; cap RF less 2 sec. No edema.  ?Psych:  No HI/SI, judgement and insight good, Euthymic mood. Full Affect. ? ? ?Results for orders placed or performed in visit on 03/13/22  ?POCT glycosylated hemoglobin (Hb A1C)  ?Result Value Ref Range  ? Hemoglobin A1C 5.2 4.0 - 5.6 %  ? HbA1c POC (<> result, manual entry)    ? HbA1c, POC (prediabetic range)    ? HbA1c, POC (controlled diabetic range)    ?POCT UA - Microalbumin  ?Result Value Ref Range  ? Microalbumin Ur, POC 10 mg/L  ? Creatinine, POC 50 mg/dL  ? Albumin/Creatinine Ratio, Urine, POC <30   ? ? Assessment & Plan  ?  ? ? ?Problem List Items Addressed This Visit   ? ?  ? Cardiovascular and Mediastinum  ? Hypertension associated with diabetes ([place])  ?  -BP stable. ?-Continue current medication regimen. Will repeat CMP with annual medicare wellness. Recent Creatinine and GFR  wnl's. ?-Will continue to monitor. ? ?  ?  ?  ? Endocrine  ? Diabetes mellitus without complication ([place]) - Primary (Chronic)  ?  -Diet controlled. A1c today 5.2. ?-Encourage ambulatory glucose monitoring especially when not feeling well.  ?-Continue to monitor carbohydrate/sugar intake.  ?-UA microalbumin collected,  ?-Will continue to monitor. ? ?  ?  ? Relevant Orders  ? POCT glycosylated hemoglobin (Hb A1C) (Completed)  ? POCT UA -  Microalbumin (Completed)  ?  ? Other  ? Hyperlipidemia  ?  -Last lipid panel: HDL 29, LDL 35 ?-Continue Vytorin 10-20 mg.  ?-Will repeat lipid panel and hepatic function with annual medicare wellness. ?-Will continue to monitor. ? ?  ?  ? Anxiety  ?  -Overall stable. Continue to use good support system at home. PDMP reviewed, no aberrancies noted. Lorazepam 1 mg last filled 10/11/2020. Provided refill. Will continue to monitor. ? ?  ?  ? Relevant Medications  ? LORazepam (ATIVAN) 1 MG tablet  ? ?Other Visit Diagnoses   ? ? Pneumatosis intestinalis of small intestine      ? ?  ? ?Pneumatosis of intestinalis of small bowel: ?-Followed by Gastroenterology. ?-Reviewed [facility] consult 01/15/2022, pt started on pyridostigmine (mestinon) 60 mg TID. ? ?Return in about 6 months (around 09/12/2022) for [place] and [place].  ?   ? ? ? ?Lorrene Reid, PA-C  ?[facility] ?[phone] (phone) ?[phone] (fax) ? ?[facility] ?

## 2022-03-13 NOTE — Assessment & Plan Note (Signed)
-  Overall stable. Continue to use good support system at home. PDMP reviewed, no aberrancies noted. Lorazepam 1 mg last filled 10/11/2020. Provided refill. Will continue to monitor. ?

## 2022-03-13 NOTE — Assessment & Plan Note (Signed)
-  Diet controlled. A1c today 5.2. ?-Encourage ambulatory glucose monitoring especially when not feeling well.  ?-Continue to monitor carbohydrate/sugar intake.  ?-UA microalbumin collected,  ?-Will continue to monitor. ?

## 2022-03-27 ENCOUNTER — Other Ambulatory Visit: Payer: Self-pay | Admitting: Physician Assistant

## 2022-03-27 DIAGNOSIS — E785 Hyperlipidemia, unspecified: Secondary | ICD-10-CM

## 2022-04-24 ENCOUNTER — Other Ambulatory Visit: Payer: Self-pay | Admitting: Internal Medicine

## 2022-05-09 ENCOUNTER — Ambulatory Visit
Admission: EM | Admit: 2022-05-09 | Discharge: 2022-05-09 | Disposition: A | Discharge status: Home / Self Care | Payer: Medicare HMO | Attending: Internal Medicine | Admitting: Internal Medicine

## 2022-05-09 DIAGNOSIS — J029 Acute pharyngitis, unspecified: Secondary | ICD-10-CM | POA: Diagnosis present

## 2022-05-09 DIAGNOSIS — J069 Acute upper respiratory infection, unspecified: Secondary | ICD-10-CM | POA: Diagnosis present

## 2022-05-09 LAB — POCT RAPID STREP A (OFFICE): Rapid Strep A Screen: NEGATIVE

## 2022-05-09 MED ORDER — BENZONATATE 100 MG PO CAPS
100.0000 mg | ORAL_CAPSULE | Freq: Three times a day (TID) | ORAL | 0 refills | Status: DC | PRN
Start: 2022-05-09 — End: 2023-08-04

## 2022-05-09 NOTE — ED Provider Notes (Signed)
[facility]    CSN: [id number] Arrival date & time: 05/09/22  1009      History   Chief Complaint Chief Complaint  Patient presents with   Cough    HPI Andrea Brooks is a 86 y.o. female.   Patient presents with nasal congestion, cough, sore throat that started a few days prior.  Patient reports that sore throat is more prominent symptom.  Denies any known fever.  Her husband has had similar symptoms.  Denies chest pain, shortness of breath, ear pain, nausea, vomiting, diarrhea, abdominal pain.  Patient took Mucinex today with minimal improvement.  Denies history of asthma or COPD.   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   Left forearm and Left forearm superior    Patient Active Problem List   Diagnosis Date Noted   Small intestinal bacterial overgrowth (SIBO) 04/11/2021   Bowel perforation ([place]) 03/11/2021   Chronic diastolic CHF (congestive heart failure) ([place]) 03/11/2021   Bloating 01/24/2021   Flatulence 01/24/2021   External hemorrhoids 01/24/2021   Encounter for Medicare annual wellness exam 04/04/2019   Chronic thoracic back pain 01/03/2019   Acute congestive heart failure (HCC)    Non-ST elevation (NSTEMI) myocardial infarction ([place])    Acute diastolic (congestive) heart failure ([place]) 07/02/2018   Elevated troponin 07/02/2018   Diabetes mellitus without complication ([place]) 09/38/1829   Diverticulitis-  mild symptoms 03/30/2018   Irritable bowel syndrome with diarrhea 03/30/2018   Healthcare maintenance 03/17/2018   Pain of right heel 07/06/2017   Loose stools 06/24/2017   Right-sided thoracic back pain 06/24/2017    Hyperlipidemia 04/22/2017   GERD (gastroesophageal reflux disease) 04/22/2017   Impaired glucose metabolism 04/22/2017   Hypertension associated with diabetes ([place]) 04/22/2017   Anxiety 04/22/2017    Past Surgical History:  Procedure Laterality Date   ABDOMINAL HYSTERECTOMY     total   BREAST BIOPSY Left    neg   CHOLECYSTECTOMY     JOINT REPLACEMENT Bilateral    knee   LAPAROTOMY N/A 03/11/2021   Procedure: EXPLORATORY LAPAROTOMY;  Surgeon: Clovis Riley, MD;  Location: [place];  Service: General;  Laterality: N/A;   LEFT HEART CATH AND CORONARY ANGIOGRAPHY N/A 07/04/2018   Procedure: LEFT HEART CATH AND CORONARY ANGIOGRAPHY;  Surgeon: Jettie Booze, MD;  Location: [place] [facility];  Service: Cardiovascular;  Laterality: N/A;   REPLACEMENT TOTAL KNEE BILATERAL      OB History   No obstetric history on file.      Home Medications    Prior to Admission medications   Medication Sig Start Date End Date Taking? Authorizing Provider  benzonatate (TESSALON) 100 MG capsule Take 1 capsule (100 mg total) by mouth every 8 (eight) hours as needed for cough. 05/09/22  Yes Habeeb Puertas, Hildred Alamin E, FNP  acetaminophen (TYLENOL) 500 MG tablet Take 1,000 mg by mouth every 6 (six) hours as needed for moderate pain.    [provider]  alum & mag hydroxide-simeth (MAALOX/MYLANTA) 200-200-20 MG/5ML suspension Take 30 mLs by mouth every 6 (six) hours as needed for indigestion or  heartburn.    [provider]  aspirin EC 81 MG tablet Take 81 mg by mouth daily.    [provider]  calcium carbonate (TUMS - DOSED IN MG ELEMENTAL CALCIUM) 500 MG chewable tablet Chew 2 tablets by mouth as needed for indigestion or heartburn.    [provider]  carvedilol (COREG) 6.25 MG tablet TAKE 1 TABLET TWICE A DAY WITH MEALS 04/24/22   Hilty, Nadean Corwin, MD  dicyclomine (BENTYL) 10 MG capsule Take 1 capsule (10 mg total) by mouth 3 (three) times daily as needed for spasms (IBS  flareup). Future refills need to come from GI 01/24/21   Zehr, Laban Emperor, PA-C  ezetimibe-simvastatin (VYTORIN) 10-20 MG tablet TAKE 1 TABLET DAILY 03/27/22   Lorrene Reid, PA-C  hydrocortisone (ANUSOL-HC) 2.5 % rectal cream Place 1 application rectally 2 (two) times daily. 01/24/21   Zehr, Janett Billow D, PA-C  ipratropium (ATROVENT) 0.06 % nasal spray Place 2 sprays into both nostrils 2 (two) times daily. 07/09/21   [provider]  LORazepam (ATIVAN) 1 MG tablet 1/2 tablet daily as needed for anxiety 03/13/22   Abonza, Maritza, PA-C  metoCLOPramide (REGLAN) 5 MG tablet Take 1 tablet (5 mg total) by mouth every 8 (eight) hours as needed for nausea. 02/16/19   Danford, [name] D, NP  metroNIDAZOLE (FLAGYL) 250 MG tablet Take 1 tablet (250 mg total) by mouth 3 (three) times daily. 12/01/21   Doran Stabler, MD  Multiple Vitamins-Minerals (MULTI FOR HER 50+) TABS Take 1 tablet by mouth daily.    [provider]  omeprazole (PRILOSEC) 20 MG capsule TAKE 1 CAPSULE BY MOUTH DAILY, 30 MINUTES BEFORE BREAKFAST. 08/27/21   Zehr, Janett Billow D, PA-C  ondansetron (ZOFRAN-ODT) 4 MG disintegrating tablet Take 1 tablet (4 mg total) by mouth as needed for nausea or vomiting. 12/01/21   Doran Stabler, MD  valsartan (DIOVAN) 320 MG tablet TAKE 1 TABLET DAILY 11/10/21   Lorrene Reid, PA-C    Family History Family History  Problem Relation Age of Onset   Hypertension Mother    Colon cancer Mother    Stroke Father    Heart failure Father    Stroke Sister    Stroke Brother    Berenice Primas' disease Daughter    Breast cancer Paternal Aunt    Graves' disease Sister    Bone cancer Sister    Breast cancer Maternal Aunt    Esophageal cancer Neg Hx    Rectal cancer Neg Hx    Liver cancer Neg Hx     Social History Social History   Tobacco Use   Smoking status: Former    Packs/day: 1.00    Years: 2.00    Total pack years: 2.00    Types: Cigarettes    Quit date: 11/24/1959    Years since quitting: 62.4    Smokeless tobacco: Never  Vaping Use   Vaping Use: Never used  Substance Use Topics   Alcohol use: No   Drug use: No     Allergies   Tape   Review of Systems Review of Systems Per HPI  Physical Exam Triage Vital Signs ED Triage Vitals [05/09/22 1125]  Enc Vitals Group     BP (!) 148/77     Pulse Rate 65     Resp 18     Temp 97.7 F (36.5 C)     Temp Source Oral     SpO2 96 %     Weight  Height      Head Circumference      Peak Flow      Pain Score 0     Pain Loc      Pain Edu?      Excl. in [place]?    No data found.  Updated Vital Signs BP (!) 148/77 (BP Location: Left Arm)   Pulse 65   Temp 97.7 F (36.5 C) (Oral)   Resp 18   SpO2 96%   Visual Acuity Right Eye Distance:   Left Eye Distance:   Bilateral Distance:    Right Eye Near:   Left Eye Near:    Bilateral Near:     Physical Exam Constitutional:      General: She is not in acute distress.    Appearance: Normal appearance.  HENT:     Head: Normocephalic and atraumatic.     Right Ear: Tympanic membrane and ear canal normal.     Left Ear: Tympanic membrane and ear canal normal.     Nose: Congestion present.     Mouth/Throat:     Mouth: Mucous membranes are moist.     Pharynx: Posterior oropharyngeal erythema present.  Eyes:     Extraocular Movements: Extraocular movements intact.     Conjunctiva/sclera: Conjunctivae normal.     Pupils: Pupils are equal, round, and reactive to light.  Cardiovascular:     Rate and Rhythm: Normal rate and regular rhythm.     Pulses: Normal pulses.     Heart sounds: Normal heart sounds.  Pulmonary:     Effort: Pulmonary effort is normal. No respiratory distress.     Breath sounds: Normal breath sounds. No stridor. No wheezing, rhonchi or rales.  Abdominal:     General: Abdomen is flat. Bowel sounds are normal.     Palpations: Abdomen is soft.  Musculoskeletal:        General: Normal range of motion.     Cervical back: Normal range of motion.   Skin:    General: Skin is warm and dry.  Neurological:     General: No focal deficit present.     Mental Status: She is alert and oriented to person, place, and time. Mental status is at baseline.  Psychiatric:        Mood and Affect: Mood normal.        Behavior: Behavior normal.      UC Treatments / Results  Labs (all labs ordered are listed, but only abnormal results are displayed) Labs Reviewed  NOVEL CORONAVIRUS, NAA  CULTURE, GROUP A STREP [facility])  POCT RAPID STREP A (OFFICE)    EKG   Radiology No results found.  Procedures Procedures (including critical care time)  Medications Ordered in UC Medications - No data to display  Initial Impression / Assessment and Plan / UC Course  I have reviewed the triage vital signs and the nursing notes.  Pertinent labs & imaging results that were available during my care of the patient were reviewed by me and considered in my medical decision making (see chart for details).     Patient presents with symptoms likely from a viral upper respiratory infection. Differential includes bacterial pneumonia, sinusitis, allergic rhinitis, COVID-19, flu. Do not suspect underlying cardiopulmonary process. Symptoms seem unlikely related to ACS, CHF or COPD exacerbations, pneumonia, pneumothorax. Patient is nontoxic appearing and not in need of emergent medical intervention.  Rapid strep was negative.  Throat culture and COVID test pending.  Recommended symptom control with over the  counter medications.  Patient sent medications.Patient stated that she already had nasal sprays at home. Do no think that chest imaging is necessary given no adventitious lung sounds on exam.   Return if symptoms fail to improve in 1-2 weeks or you develop shortness of breath, chest pain, severe headache. Patient states understanding and is agreeable.  Discharged with PCP followup.  Final Clinical Impressions(s) / UC Diagnoses   Final diagnoses:  Viral upper  respiratory tract infection with cough  Sore throat     Discharge Instructions      It appears that you have a viral upper respiratory infection that should run its course and self resolve with symptomatic treatment. You have been prescribed a cough medication to take as needed. Rapid strep was negative. Throat culture and covid testing are pending. Please follow up if symptoms persist or worsen.      ED Prescriptions     Medication Sig Dispense Auth. Provider   benzonatate (TESSALON) 100 MG capsule Take 1 capsule (100 mg total) by mouth every 8 (eight) hours as needed for cough. 21 capsule [place], Michele Rockers, [place]      PDMP not reviewed this encounter.   Teodora Medici,  05/09/22 1228

## 2022-05-09 NOTE — Discharge Instructions (Addendum)
It appears that you have a viral upper respiratory infection that should run its course and self resolve with symptomatic treatment. You have been prescribed a cough medication to take as needed. Rapid strep was negative. Throat culture and covid testing are pending. Please follow up if symptoms persist or worsen.

## 2022-05-09 NOTE — ED Triage Notes (Signed)
Pt c/o cough x a few days not better w/ otc tx.

## 2022-05-10 LAB — NOVEL CORONAVIRUS, NAA: SARS-CoV-2, NAA: NOT DETECTED

## 2022-05-11 ENCOUNTER — Other Ambulatory Visit: Payer: Self-pay | Admitting: Physician Assistant

## 2022-05-11 DIAGNOSIS — E1159 Type 2 diabetes mellitus with other circulatory complications: Secondary | ICD-10-CM

## 2022-05-12 LAB — CULTURE, GROUP A STREP (THRC)

## 2022-06-25 ENCOUNTER — Other Ambulatory Visit: Payer: Self-pay | Admitting: Physician Assistant

## 2022-06-25 DIAGNOSIS — E785 Hyperlipidemia, unspecified: Secondary | ICD-10-CM

## 2022-07-23 ENCOUNTER — Other Ambulatory Visit: Payer: Self-pay | Admitting: Internal Medicine

## 2022-09-15 ENCOUNTER — Encounter: Payer: Self-pay | Admitting: Physician Assistant

## 2022-09-15 ENCOUNTER — Ambulatory Visit (INDEPENDENT_AMBULATORY_CARE_PROVIDER_SITE_OTHER): Payer: Medicare HMO | Admitting: Physician Assistant

## 2022-09-15 VITALS — BP 165/74 | HR 64 | Temp 98.0°F | Ht 64.5 in | Wt 139.0 lb

## 2022-09-15 DIAGNOSIS — Z0000 Encounter for general adult medical examination without abnormal findings: Secondary | ICD-10-CM

## 2022-09-15 DIAGNOSIS — I152 Hypertension secondary to endocrine disorders: Secondary | ICD-10-CM

## 2022-09-15 DIAGNOSIS — E785 Hyperlipidemia, unspecified: Secondary | ICD-10-CM

## 2022-09-15 DIAGNOSIS — E1159 Type 2 diabetes mellitus with other circulatory complications: Secondary | ICD-10-CM | POA: Diagnosis not present

## 2022-09-15 DIAGNOSIS — F419 Anxiety disorder, unspecified: Secondary | ICD-10-CM

## 2022-09-15 DIAGNOSIS — E119 Type 2 diabetes mellitus without complications: Secondary | ICD-10-CM | POA: Diagnosis not present

## 2022-09-15 MED ORDER — LORAZEPAM 1 MG PO TABS: ORAL_TABLET | ORAL | 0 refills | Status: DC

## 2022-09-15 NOTE — Progress Notes (Signed)
Subjective:   Andrea Brooks is a 86 y.o. female who presents for Medicare Annual (Subsequent) preventive examination.  Review of Systems    General:   No F/C, wt loss Pulm:   No DIB, SOB, pleuritic chest pain Card:  No CP, palpitations Abd:  +pain, +nausea/vomiting (improved from prior) Ext:  No edema        Objective:    Today's Vitals   09/15/22 0841  BP: (!) 152/83  Pulse: 64  Temp: 98 F (36.7 C)  TempSrc: Temporal  Weight: 139 lb (63 kg)  Height: 5' 4.5" (1.638 m)   Body mass index is 23.49 kg/m.     03/12/2021    1:46 AM 03/12/2021    1:00 AM 10/29/2019   12:50 PM 01/16/2019   11:50 AM 09/08/2018    3:33 AM 07/02/2018    8:19 PM 06/28/2018   11:18 AM  Advanced Directives  Does Patient Have a Medical Advance Directive?  No Yes Yes No No No  Type of Scientist, physiological of [place];Living will Living will;Healthcare Power of Attorney     Would patient like information on creating a medical advance directive? No - Patient declined    No - Patient declined No - Patient declined No - Patient declined    Current Medications (verified) Outpatient Encounter Medications as of 09/15/2022  Medication Sig   acetaminophen (TYLENOL) 500 MG tablet Take 1,000 mg by mouth every 6 (six) hours as needed for moderate pain.   alum & mag hydroxide-simeth (MAALOX/MYLANTA) 200-200-20 MG/5ML suspension Take 30 mLs by mouth every 6 (six) hours as needed for indigestion or heartburn.   aspirin EC 81 MG tablet Take 81 mg by mouth daily.   benzonatate (TESSALON) 100 MG capsule Take 1 capsule (100 mg total) by mouth every 8 (eight) hours as needed for cough.   calcium carbonate (TUMS - DOSED IN MG ELEMENTAL CALCIUM) 500 MG chewable tablet Chew 2 tablets by mouth as needed for indigestion or heartburn.   carvedilol (COREG) 6.25 MG tablet TAKE 1 TABLET TWICE A DAY WITH MEALS   dicyclomine (BENTYL) 10 MG capsule Take 1 capsule (10 mg total) by mouth 3 (three) times daily as  needed for spasms (IBS flareup). Future refills need to come from GI   ezetimibe-simvastatin (VYTORIN) 10-20 MG tablet TAKE 1 TABLET DAILY   ipratropium (ATROVENT) 0.06 % nasal spray Place 2 sprays into both nostrils 2 (two) times daily.   metoCLOPramide (REGLAN) 5 MG tablet Take 1 tablet (5 mg total) by mouth every 8 (eight) hours as needed for nausea.   metroNIDAZOLE (FLAGYL) 250 MG tablet Take 1 tablet (250 mg total) by mouth 3 (three) times daily. (Patient taking differently: Take 250 mg by mouth daily.)   Multiple Vitamins-Minerals (MULTI FOR HER 50+) TABS Take 1 tablet by mouth daily.   omeprazole (PRILOSEC) 20 MG capsule TAKE 1 CAPSULE BY MOUTH DAILY, 30 MINUTES BEFORE BREAKFAST.   ondansetron (ZOFRAN-ODT) 4 MG disintegrating tablet Take 1 tablet (4 mg total) by mouth as needed for nausea or vomiting.   pyridostigmine (MESTINON) 60 MG tablet Take by mouth.   valsartan (DIOVAN) 320 MG tablet TAKE 1 TABLET DAILY   [DISCONTINUED] LORazepam (ATIVAN) 1 MG tablet 1/2 tablet daily as needed for anxiety   hydrocortisone (ANUSOL-HC) 2.5 % rectal cream Place 1 application rectally 2 (two) times daily. (Patient not taking: Reported on 09/15/2022)   LORazepam (ATIVAN) 1 MG tablet 1/2 tablet daily as needed for anxiety  No facility-administered encounter medications on file as of 09/15/2022.    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   Left forearm and Left forearm superior   Past Surgical History:  Procedure Laterality Date   ABDOMINAL HYSTERECTOMY     total   BREAST BIOPSY Left    neg   CHOLECYSTECTOMY     JOINT REPLACEMENT Bilateral     knee   LAPAROTOMY N/A 03/11/2021   Procedure: EXPLORATORY LAPAROTOMY;  Surgeon: Clovis Riley, MD;  Location: [place];  Service: General;  Laterality: N/A;   LEFT HEART CATH AND CORONARY ANGIOGRAPHY N/A 07/04/2018   Procedure: LEFT HEART CATH AND CORONARY ANGIOGRAPHY;  Surgeon: Jettie Booze, MD;  Location: [place] [facility];  Service: Cardiovascular;  Laterality: N/A;   REPLACEMENT TOTAL KNEE BILATERAL     Family History  Problem Relation Age of Onset   Hypertension Mother    Colon cancer Mother    Stroke Father    Heart failure Father    Stroke Sister    Stroke Brother    Graves' disease Daughter    Breast cancer Paternal Aunt    Graves' disease Sister    Bone cancer Sister    Breast cancer Maternal Aunt    Esophageal cancer Neg Hx    Rectal cancer Neg Hx    Liver cancer Neg Hx    Social History   Socioeconomic History   Marital status: Married    Spouse name: Not on file   Number of children: 1   Years of education: Not on file   Highest education level: Not on file  Occupational History   Occupation: retired  Tobacco Use   Smoking status: Former    Packs/day: 1.00    Years: 2.00    Total pack years: 2.00    Types: Cigarettes    Quit date: 11/24/1959    Years since quitting: 62.8   Smokeless tobacco: Never  Vaping Use   Vaping Use: Never used  Substance and Sexual Activity   Alcohol use: No   Drug use: No   Sexual activity: Not Currently  Other Topics Concern   Not on file  Social History Narrative   Not on file   Social Determinants of Health   Financial Resource Strain: Not on file  Food Insecurity: Not on file  Transportation Needs: Not on file  Physical Activity: Not on file  Stress: Not on file  Social Connections: Not on file    Tobacco Counseling Counseling given: Not Answered     How often do you need to have someone help you when you read instructions, pamphlets, or other written materials from your doctor or pharmacy?: (P) 1 -  Never  Diabetic? Yes, diet controlled         Activities of Daily Living    09/15/2022    8:43 AM 09/11/2022    1:19 PM  In your present state of health, do you have any difficulty performing the following activities:  Hearing? 0 0  Vision? 0 0  Difficulty concentrating or making  decisions? 0 0  Walking or climbing stairs? 1 1  Dressing or bathing? 0 0  Doing errands, shopping? 0 0  Preparing Food and eating ?  N  Using the Toilet?  N  In the past six months, have you accidently leaked urine?  Y  Do you have problems with loss of bowel control?  Y  Managing your Medications?  N  Managing your Finances?  N  Housekeeping or managing your Housekeeping?  N    Patient Care Team: Lorrene Reid, PA-C as PCP - General (Physician Assistant) Pixie Casino, MD as PCP - Cardiology (Cardiology) Loletha Carrow Kirke Corin, MD as Consulting Physician (Gastroenterology)  Indicate any recent Medical Services you may have received from other than [facility] providers in the past year (date may be approximate).     Assessment:   This is a routine wellness examination for Andrea Brooks.  Hearing/Vision screen No results found.  Dietary issues and exercise activities discussed:  -Patient with hx of pneumatosis so is careful about her diet. Stays active with house activities.   Goals Addressed   None   Depression Screen    09/15/2022    8:43 AM 03/13/2022   11:10 AM 09/11/2021    9:50 AM 05/09/2021   11:02 AM 01/07/2021   11:08 AM 10/02/2020    2:33 PM 05/15/2020   11:08 AM  PHQ 2/9 Scores  PHQ - 2 Score 0 0 0 0 0 0 0  PHQ- 9 Score '3 3 2 3 1 4 2    '$ Fall Risk    09/15/2022    8:43 AM 09/11/2022    1:19 PM 03/13/2022   11:10 AM 09/11/2021    9:50 AM 05/09/2021   11:02 AM  Fall Risk   Falls in the past year? 0 0 0 0 0  Number falls in past yr: 0  0 0 0  Injury with Fall? 0  0 0 0  Risk for fall due to : No Fall Risks  No Fall Risks No Fall Risks   Follow up Falls evaluation completed   Falls evaluation completed Falls evaluation completed Follow up appointment    [place]:  Any stairs in or around the home? Yes  If so, are there any without handrails? Yes  Home free of loose throw rugs in walkways, pet beds, electrical cords, etc? No  Adequate lighting in your home to reduce risk of falls? Yes   ASSISTIVE DEVICES UTILIZED TO PREVENT FALLS:  Life alert? No  Use of a cane, walker or w/c? No  Grab bars in the bathroom? Yes  Shower chair or bench in shower? No  Elevated toilet seat or a handicapped toilet? Yes   TIMED UP AND GO:  Was the test performed? Yes .  Length of time to ambulate 10 feet: 10 sec.   Gait slow and steady without use of assistive device  Cognitive Function: wnl's        09/15/2022    8:37 AM 05/09/2021   11:03 AM 04/04/2019    1:19 PM 06/15/2018   11:04 AM 11/04/2017   10:59 AM  6CIT Screen  What Year? 0 points 0 points 0 points 0 points 0 points  What month? 0 points 0 points 0 points 0 points 0 points  What time? 0 points 0 points 0 points 0 points 0 points  Count back from 20 0 points 0 points 0 points 0 points 0 points  Months in reverse  0 points 0 points 0 points 0 points 0 points  Repeat phrase 0 points 0 points 2 points 0 points 0 points  Total Score 0 points 0 points 2 points 0 points 0 points    Immunizations Immunization History  Administered Date(s) Administered   Fluad Quad(high Dose 65+) 11/11/2020, 09/05/2021   Influenza, High Dose Seasonal PF 09/19/2018, 10/02/2019   Influenza-Unspecified 10/05/2017, 10/02/2019   PFIZER(Purple Top)SARS-COV-2 Vaccination 01/21/2020, 02/12/2020   Pneumococcal Conjugate-13 10/08/2014, 10/05/2017   Pneumococcal Polysaccharide-23 09/08/1999   Tdap 07/14/2016   Zoster Recombinat (Shingrix) 09/22/2018, 01/11/2019, 06/09/2019   Zoster, Live 09/05/2012    TDAP status: Up to date  Flu Vaccine status: Due, Education has been provided regarding the  importance of this vaccine. Advised may receive this vaccine at local pharmacy or Health Dept. Aware to provide a copy of the vaccination record if obtained from local pharmacy or Health Dept. Verbalized acceptance and understanding.  Pneumococcal vaccine status: Up to date  Covid-19 vaccine status: Completed vaccines  Qualifies for Shingles Vaccine? Yes   Zostavax completed Yes   Shingrix Completed?: Yes  Screening Tests Health Maintenance  Topic Date Due   Pneumonia Vaccine 14+ Years old (3 - PPSV23 or PCV20) 10/05/2018   COVID-19 Vaccine (3 - Pfizer risk series) 03/11/2020   Medicare Annual Wellness (AWV)  06/08/2022   INFLUENZA VACCINE  06/23/2022   Diabetic kidney evaluation - GFR measurement  09/05/2022   FOOT EXAM  09/11/2022   HEMOGLOBIN A1C  09/12/2022   OPHTHALMOLOGY EXAM  12/01/2022   Diabetic kidney evaluation - Urine ACR  03/14/2023   TETANUS/TDAP  07/14/2026   DEXA SCAN  Completed   Zoster Vaccines- Shingrix  Completed   HPV VACCINES  Aged Out    Health Maintenance  Health Maintenance Due  Topic Date Due   Pneumonia Vaccine 69+ Years old (16 - PPSV23 or PCV20) 10/05/2018   COVID-19 Vaccine (3 - Pfizer risk series) 03/11/2020   Medicare Annual Wellness (AWV)  06/08/2022   INFLUENZA VACCINE  06/23/2022   Diabetic kidney evaluation - GFR measurement  09/05/2022   FOOT EXAM  09/11/2022   HEMOGLOBIN A1C  09/12/2022    Colorectal cancer screening: No longer required.   Mammogram status: Completed 12/16/2018. Repeat every year  Bone Density status: Completed 10/12/2018. Results reflect: Bone density results: NORMAL. Repeat every 3 years. Pt deferred repeating.  Lung Cancer Screening: (Low Dose CT Chest recommended if Age 34-80 years, 30 pack-year currently smoking OR have quit w/in 15years.) does not qualify.   Lung Cancer Screening Referral: n/a  Additional Screening:  Hepatitis C Screening: does not qualify; Completed n/a  Vision Screening: Recommended  annual ophthalmology exams for early detection of glaucoma and other disorders of the eye. Is the patient up to date with their annual eye exam?  Yes  Who is the provider or what is the name of the office in which the patient attends annual eye exams? Dr Monna Fam If pt is not established with a provider, would they like to be referred to a provider to establish care? No .   Dental Screening: Recommended annual dental exams for proper oral hygiene  Community Resource Referral / Chronic Care Management: CRR required this visit?  No   CCM required this visit?  No      Plan:  -Will obtain routine fasting labs. -BP elevated likely secondary to not taking blood pressure medication yet. Recommend monitoring BP at home and notify the clinic if BP consistently >140/90. -Continue to  follow-up with various specialists.  -Follow-up in 6 months for reg OV- HTN, HLD, DM and medication management  I have personally reviewed and noted the following in the patient's chart:   Medical and social history Use of alcohol, tobacco or illicit drugs  Current medications and supplements including opioid prescriptions. Patient is not currently taking opioid prescriptions. Functional ability and status Nutritional status Physical activity Advanced directives List of other physicians Hospitalizations, surgeries, and ER visits in previous 12 months Vitals Screenings to include cognitive, depression, and falls Referrals and appointments  In addition, I have reviewed and discussed with patient certain preventive protocols, quality metrics, and best practice recommendations. A written personalized care plan for preventive services as well as general preventive health recommendations were provided to patient.

## 2022-09-15 NOTE — Patient Instructions (Signed)
Preventive Care 65 Years and Older, Female Preventive care refers to lifestyle choices and visits with your health care provider that can promote health and wellness. Preventive care visits are also called wellness exams. What can I expect for my preventive care visit? Counseling Your health care provider may ask you questions about your: Medical history, including: Past medical problems. Family medical history. Pregnancy and menstrual history. History of falls. Current health, including: Memory and ability to understand (cognition). Emotional well-being. Home life and relationship well-being. Sexual activity and sexual health. Lifestyle, including: Alcohol, nicotine or tobacco, and drug use. Access to firearms. Diet, exercise, and sleep habits. Work and work environment. Sunscreen use. Safety issues such as seatbelt and bike helmet use. Physical exam Your health care provider will check your: Height and weight. These may be used to calculate your BMI (body mass index). BMI is a measurement that tells if you are at a healthy weight. Waist circumference. This measures the distance around your waistline. This measurement also tells if you are at a healthy weight and may help predict your risk of certain diseases, such as type 2 diabetes and high blood pressure. Heart rate and blood pressure. Body temperature. Skin for abnormal spots. What immunizations do I need?  Vaccines are usually given at various ages, according to a schedule. Your health care provider will recommend vaccines for you based on your age, medical history, and lifestyle or other factors, such as travel or where you work. What tests do I need? Screening Your health care provider may recommend screening tests for certain conditions. This may include: Lipid and cholesterol levels. Hepatitis C test. Hepatitis B test. HIV (human immunodeficiency virus) test. STI (sexually transmitted infection) testing, if you are at  risk. Lung cancer screening. Colorectal cancer screening. Diabetes screening. This is done by checking your blood sugar (glucose) after you have not eaten for a while (fasting). Mammogram. Talk with your health care provider about how often you should have regular mammograms. BRCA-related cancer screening. This may be done if you have a family history of breast, ovarian, tubal, or peritoneal cancers. Bone density scan. This is done to screen for osteoporosis. Talk with your health care provider about your test results, treatment options, and if necessary, the need for more tests. Follow these instructions at home: Eating and drinking  Eat a diet that includes fresh fruits and vegetables, whole grains, lean protein, and low-fat dairy products. Limit your intake of foods with high amounts of sugar, saturated fats, and salt. Take vitamin and mineral supplements as recommended by your health care provider. Do not drink alcohol if your health care provider tells you not to drink. If you drink alcohol: Limit how much you have to 0-1 drink a day. Know how much alcohol is in your drink. In the [place] one drink equals one 12 oz bottle of beer (355 mL), one 5 oz glass of wine (148 mL), or one 1 oz glass of hard liquor (44 mL). Lifestyle Brush your teeth every morning and night with fluoride toothpaste. Floss one time each day. Exercise for at least 30 minutes 5 or more days each week. Do not use any products that contain nicotine or tobacco. These products include cigarettes, chewing tobacco, and vaping devices, such as e-cigarettes. If you need help quitting, ask your health care provider. Do not use drugs. If you are sexually active, practice safe sex. Use a condom or other form of protection in order to prevent STIs. Take aspirin only as told by   your health care provider. Make sure that you understand how much to take and what form to take. Work with your health care provider to find out whether it  is safe and beneficial for you to take aspirin daily. Ask your health care provider if you need to take a cholesterol-lowering medicine (statin). Find healthy ways to manage stress, such as: Meditation, yoga, or listening to music. Journaling. Talking to a trusted person. Spending time with friends and family. Minimize exposure to UV radiation to reduce your risk of skin cancer. Safety Always wear your seat belt while driving or riding in a vehicle. Do not drive: If you have been drinking alcohol. Do not ride with someone who has been drinking. When you are tired or distracted. While texting. If you have been using any mind-altering substances or drugs. Wear a helmet and other protective equipment during sports activities. If you have firearms in your house, make sure you follow all gun safety procedures. What's next? Visit your health care provider once a year for an annual wellness visit. Ask your health care provider how often you should have your eyes and teeth checked. Stay up to date on all vaccines. This information is not intended to replace advice given to you by your health care provider. Make sure you discuss any questions you have with your health care provider. Document Revised: 05/07/2021 Document Reviewed: 05/07/2021 Elsevier Patient Education  2023 Elsevier Inc.  

## 2022-09-16 ENCOUNTER — Telehealth: Payer: Self-pay

## 2022-09-16 LAB — CBC WITH DIFFERENTIAL/PLATELET
Basophils Absolute: 0 10*3/uL (ref 0.0–0.2)
Basos: 0 %
EOS (ABSOLUTE): 0.1 10*3/uL (ref 0.0–0.4)
Eos: 1 %
Hematocrit: 39.2 % (ref 34.0–46.6)
Hemoglobin: 12.9 g/dL (ref 11.1–15.9)
Immature Grans (Abs): 0 10*3/uL (ref 0.0–0.1)
Immature Granulocytes: 0 %
Lymphocytes Absolute: 1.2 10*3/uL (ref 0.7–3.1)
Lymphs: 17 %
MCH: 30.3 pg (ref 26.6–33.0)
MCHC: 32.9 g/dL (ref 31.5–35.7)
MCV: 92 fL (ref 79–97)
Monocytes Absolute: 0.6 10*3/uL (ref 0.1–0.9)
Monocytes: 8 %
Neutrophils Absolute: 5.3 10*3/uL (ref 1.4–7.0)
Neutrophils: 74 %
Platelets: 199 10*3/uL (ref 150–450)
RBC: 4.26 x10E6/uL (ref 3.77–5.28)
RDW: 13 % (ref 11.7–15.4)
WBC: 7.3 10*3/uL (ref 3.4–10.8)

## 2022-09-16 LAB — LIPID PANEL
Chol/HDL Ratio: 2.5 ratio (ref 0.0–4.4)
Cholesterol, Total: 79 mg/dL — ABNORMAL LOW (ref 100–199)
HDL: 31 mg/dL — ABNORMAL LOW (ref >39)
LDL Chol Calc (NIH): 29 mg/dL (ref 0–99)
Triglycerides: 101 mg/dL (ref 0–149)
VLDL Cholesterol Cal: 19 mg/dL (ref 5–40)

## 2022-09-16 LAB — COMPREHENSIVE METABOLIC PANEL
ALT: 7 IU/L (ref 0–32)
AST: 17 IU/L (ref 0–40)
Albumin/Globulin Ratio: 1.3 (ref 1.2–2.2)
Albumin: 3.9 g/dL (ref 3.7–4.7)
Alkaline Phosphatase: 76 IU/L (ref 44–121)
BUN/Creatinine Ratio: 12 (ref 12–28)
BUN: 9 mg/dL (ref 8–27)
Bilirubin Total: 0.3 mg/dL (ref 0.0–1.2)
CO2: 26 mmol/L (ref 20–29)
Calcium: 9.2 mg/dL (ref 8.7–10.3)
Chloride: 102 mmol/L (ref 96–106)
Creatinine, Ser: 0.75 mg/dL (ref 0.57–1.00)
Globulin, Total: 2.9 g/dL (ref 1.5–4.5)
Glucose: 109 mg/dL — ABNORMAL HIGH (ref 70–99)
Potassium: 4.4 mmol/L (ref 3.5–5.2)
Sodium: 140 mmol/L (ref 134–144)
Total Protein: 6.8 g/dL (ref 6.0–8.5)
eGFR: 78 mL/min/{1.73_m2} (ref >59)

## 2022-09-16 LAB — HEMOGLOBIN A1C
Est. average glucose Bld gHb Est-mCnc: 108 mg/dL
Hgb A1c MFr Bld: 5.4 % (ref 4.8–5.6)

## 2022-09-16 LAB — TSH: TSH: 3.06 u[IU]/mL (ref 0.450–4.500)

## 2022-09-16 NOTE — Telephone Encounter (Signed)
Called pt to inform her that the application is ready for pick up.

## 2022-09-16 NOTE — Telephone Encounter (Signed)
Pt came by and dropped off the application requesting provider signature. Placed in provider box.  Please call upon completion.

## 2022-09-21 ENCOUNTER — Other Ambulatory Visit: Payer: Self-pay | Admitting: Gastroenterology

## 2022-09-21 DIAGNOSIS — K219 Gastro-esophageal reflux disease without esophagitis: Secondary | ICD-10-CM

## 2022-09-23 ENCOUNTER — Other Ambulatory Visit: Payer: Self-pay | Admitting: Physician Assistant

## 2022-09-23 DIAGNOSIS — E785 Hyperlipidemia, unspecified: Secondary | ICD-10-CM

## 2022-10-02 ENCOUNTER — Ambulatory Visit: Payer: Medicare HMO | Admitting: Internal Medicine

## 2022-10-05 ENCOUNTER — Ambulatory Visit: Payer: Medicare HMO | Attending: Internal Medicine | Admitting: Internal Medicine

## 2022-10-05 ENCOUNTER — Encounter: Payer: Self-pay | Admitting: Internal Medicine

## 2022-10-05 VITALS — BP 133/82 | HR 62 | Ht 63.0 in | Wt 141.0 lb

## 2022-10-05 DIAGNOSIS — I10 Essential (primary) hypertension: Secondary | ICD-10-CM | POA: Diagnosis not present

## 2022-10-05 DIAGNOSIS — I5032 Chronic diastolic (congestive) heart failure: Secondary | ICD-10-CM

## 2022-10-05 DIAGNOSIS — E782 Mixed hyperlipidemia: Secondary | ICD-10-CM | POA: Diagnosis not present

## 2022-10-05 MED ORDER — SIMVASTATIN 20 MG PO TABS: 20.0000 mg | ORAL_TABLET | Freq: Every day | ORAL | 3 refills | Status: DC

## 2022-10-05 NOTE — Patient Instructions (Addendum)
Medication Instructions:  STOP Vytorin   START Simvastatin 20 mg daily   *If you need a refill on your cardiac medications before your next appointment, please call your pharmacy*   Labs:   LIPID in 3 months (no lab appointment needed, come back fasting- nothing to eat or drink)   Follow-Up: At [facility], you and your health needs are our priority.  As part of our continuing mission to provide you with exceptional heart care, we have created designated Provider Care Teams.  These Care Teams include your primary Cardiologist (physician) and Advanced Practice Providers (APPs -  Physician Assistants and Nurse Practitioners) who all work together to provide you with the care you need, when you need it.  We recommend signing up for the patient portal called "MyChart".  Sign up information is provided on this After Visit Summary.  MyChart is used to connect with patients for Virtual Visits (Telemedicine).  Patients are able to view lab/test results, encounter notes, upcoming appointments, etc.  Non-urgent messages can be sent to your provider as well.   To learn more about what you can do with MyChart, go to [email or web address].    Your next appointment:   12 month(s)  The format for your next appointment:   In Person  Provider:   Pixie Casino, MD

## 2022-10-05 NOTE — Progress Notes (Incomplete)
OFFICE NOTE  Chief Complaint:  Routine follow-up  Primary Care Physician: Lorrene Reid, PA-C  HPI:  Andrea Brooks is a 86 y.o. female with a past medial history significant for anxiety, type 2 diabetes, GERD, hypertension and dyslipidemia, who recently was admitted for persistent diarrhea, found to have an E. coli infection.  Subsequently after significant hydration she developed what was suspected to be acute diastolic congestive heart failure.  This required diuresis and she was seen by Dr. Radford Pax and Dr. Oval Linsey.  She was noted to have elevated troponin was referred for heart catheterization which showed mild nonobstructive coronary disease.  As her husband is a patient of mine, she requested to follow-up with me.  She reports her diarrhea has improved significantly.  She saw Dr. Loletha Carrow, who is been working with her to treat that.  While hospitalized they recommended starting appropriate heart failure treatment.  An echo was performed which showed normal systolic function and mild diastolic dysfunction.  It was recommended she go on to low-dose metoprolol and that her Vytorin's be switched over to atorvastatin for better LDL reduction.  She was leery of making those changes and wanted to discuss it with me further.  She did see her primary care provider in follow-up in a repeat lipid profile was performed.  This was 2 weeks ago and demonstrated total cholesterol of 85, triglycerides 128, HDL 24 and LDL 35.  At this point, it is not likely that she needs to switch cholesterol medications, and the low numbers are most likely related to her diarrhea over the past several weeks.  I suspect they are in fact falsely lowered because of this.  Overall though she feels better, denying any worsening shortness of breath or chest pain.  Pressure is mildly elevated 148/82 today.  She says is been running in the 140s at home.  10/18/2018  Andrea Brooks is seen today in routine follow-up.  Overall she is  doing well.  She is down about 6 pounds since I last saw her.  Some of that was fluid related to her diastolic heart failure also after her significant E. coli infection she is not had much of an appetite and continues to have IBS-D symptoms.  Overall she denies any chest pain or worsening shortness of breath.  We did repeat some labs indicated a small increase in her lipid profile.  Total cholesterol now 120, triglycerides 162, HDL 31 and LDL 57.  She says she did have a decrease in metformin due to improving hemoglobin A1c is 5.6.  EKG today shows sinus rhythm with left anterior fascicular block at 78.  04/14/2020  Andrea Brooks returns for follow-up.  She has no specific complaints today.  Dr. Loletha Carrow for her IBS-D symptoms.  The profile was in December 2020 which showed total cholesterol 102, triglycerides 133, HDL 28 and LDL of 50.  EKG shows sinus rhythm with first-degree AV block, left anterior fascicular block and voltage criteria for LVH at 47.  04/08/2021  Andrea Brooks is seen today in follow-up.  Unfortunately she had an eventful April.  She was hospitalized and found to have what was concerning for pneumatosis on a CT scan.  She was having abdominal pain.  She ended up going to surgery urgently but was not found to have any ischemic bowel.  Ultimately she has been placed on Flagyl and is seemingly responding to antibiotic therapy.  She had also previously had a consultation up at the [facility] clinic and then was directed to  [facility].  Fortunately with all this she had no heart failure symptoms.  She seems to be stable on her current medications with good blood pressure control.  She denies any weight gain or edema.  EKG shows a sinus rhythm with first-degree AV block.  Her lipids in December were well controlled with total cholesterol of 88 and an LDL of 42.  10/05/2022  Andrea Brooks is seen today in follow-up.  Overall she seems to be doing well.  Blood pressure was a little elevated today 150/83.  EKG  shows a sinus bradycardia with some PACs for which she is unaware of.  She did have recent lipids that showed her cholesterol has been well controlled.  Her total cholesterol is only 79 3 weeks ago with triglycerides 101, HDL 31 and LDL 29.  She is on combination therapy Vytorin 10/20 mg daily.  She has had a number of GI issues.  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   Left forearm and Left forearm superior    Past Surgical History:  Procedure Laterality Date   ABDOMINAL HYSTERECTOMY     total   BREAST BIOPSY Left    neg   CHOLECYSTECTOMY     JOINT REPLACEMENT Bilateral    knee   LAPAROTOMY N/A 03/11/2021   Procedure: EXPLORATORY LAPAROTOMY;  Surgeon: Clovis Riley, MD;  Location: [facility];  Service: General;  Laterality: N/A;   LEFT HEART CATH AND CORONARY ANGIOGRAPHY N/A 07/04/2018   Procedure: LEFT HEART CATH AND CORONARY ANGIOGRAPHY;  Surgeon: Jettie Booze, MD;  Location: [facility];  Service: Cardiovascular;  Laterality: N/A;   REPLACEMENT TOTAL KNEE BILATERAL      FAMHx:  Family History  Problem Relation Age of Onset   Hypertension Mother    Colon cancer Mother    Stroke Father    Heart failure Father    Stroke Sister    Stroke Brother    Graves' disease Daughter    Breast cancer Paternal Aunt    Graves' disease Sister    Bone cancer Sister    Breast cancer Maternal Aunt    Esophageal cancer Neg Hx    Rectal cancer Neg Hx    Liver cancer Neg Hx     SOCHx:   reports that she quit smoking about 62 years ago. Her smoking use included cigarettes. She has a 2.00 pack-year smoking history. She has never used smokeless tobacco. She  reports that she does not drink alcohol and does not use drugs.  ALLERGIES:  Allergies  Allergen Reactions   Tape Rash and Other (See Comments)    PAPER TAPE ONLY!!!! NO CLEAR, PLASTIC TAPE- TEARS THE SKIN!!    ROS: Pertinent items noted in HPI and remainder of comprehensive ROS otherwise negative.  HOME MEDS: Current Outpatient Medications on File Prior to Visit  Medication Sig Dispense Refill   acetaminophen (TYLENOL) 500 MG tablet Take 1,000 mg by mouth every 6 (six) hours as needed for moderate pain.     alum & mag hydroxide-simeth (MAALOX/MYLANTA) 200-200-20 MG/5ML suspension Take 30 mLs by mouth every 6 (  six) hours as needed for indigestion or heartburn.     aspirin EC 81 MG tablet Take 81 mg by mouth daily.     benzonatate (TESSALON) 100 MG capsule Take 1 capsule (100 mg total) by mouth every 8 (eight) hours as needed for cough. 21 capsule 0   calcium carbonate (TUMS - DOSED IN MG ELEMENTAL CALCIUM) 500 MG chewable tablet Chew 2 tablets by mouth as needed for indigestion or heartburn.     carvedilol (COREG) 6.25 MG tablet TAKE 1 TABLET TWICE A DAY WITH MEALS 180 tablet 3   dicyclomine (BENTYL) 10 MG capsule Take 1 capsule (10 mg total) by mouth 3 (three) times daily as needed for spasms (IBS flareup). Future refills need to come from GI 30 capsule 1   ezetimibe-simvastatin (VYTORIN) 10-20 MG tablet TAKE 1 TABLET DAILY 90 tablet 3   hydrocortisone (ANUSOL-HC) 2.5 % rectal cream Place 1 application rectally 2 (two) times daily. 30 g 1   ipratropium (ATROVENT) 0.06 % nasal spray Place 2 sprays into both nostrils 2 (two) times daily.     LORazepam (ATIVAN) 1 MG tablet 1/2 tablet daily as needed for anxiety 30 tablet 0   metoCLOPramide (REGLAN) 5 MG tablet Take 1 tablet (5 mg total) by mouth every 8 (eight) hours as needed for nausea. 12 tablet 0   metroNIDAZOLE (FLAGYL) 250 MG tablet Take 1 tablet (250 mg total) by mouth 3 (three) times daily. (Patient taking differently: Take 250 mg by  mouth daily. 1 tablet daily) 90 tablet 0   Multiple Vitamins-Minerals (MULTI FOR HER 50+) TABS Take 1 tablet by mouth daily.     omeprazole (PRILOSEC) 20 MG capsule TAKE 1 CAPSULE DAILY 30 MINUTES BEFORE BREAKFAST 90 capsule 3   ondansetron (ZOFRAN-ODT) 4 MG disintegrating tablet Take 1 tablet (4 mg total) by mouth as needed for nausea or vomiting. 20 tablet 1   pyridostigmine (MESTINON) 60 MG tablet Take by mouth.     valsartan (DIOVAN) 320 MG tablet TAKE 1 TABLET DAILY 90 tablet 3   No current facility-administered medications on file prior to visit.    LABS/IMAGING: No results found for this or any previous visit (from the past 48 hour(s)). No results found.  LIPID PANEL:    Component Value Date/Time   CHOL 79 (L) 09/15/2022 0902   TRIG 101 09/15/2022 0902   HDL 31 (L) 09/15/2022 0902   CHOLHDL 2.5 09/15/2022 0902   CHOLHDL 3.5 07/04/2018 0526   VLDL 26 07/04/2018 0526   LDLCALC 29 09/15/2022 0902     WEIGHTS: Wt Readings from Last 3 Encounters:  10/05/22 141 lb (64 kg)  09/15/22 139 lb (63 kg)  03/13/22 121 lb (54.9 kg)    VITALS: BP 133/82 (BP Location: Left Arm, Patient Position: Sitting)   Pulse 62   Ht '5\' 3"'$  (1.6 m)   Wt 141 lb (64 kg)   SpO2 96%   BMI 24.98 kg/m   EXAM: General appearance: alert and no distress Neck: no carotid bruit, no JVD and thyroid not enlarged, symmetric, no tenderness/mass/nodules Lungs: clear to auscultation bilaterally Heart: regular rate and rhythm, S1, S2 normal, no murmur, click, rub or gallop Abdomen: soft, non-tender; bowel sounds normal; no masses,  no organomegaly Extremities: extremities normal, atraumatic, no cyanosis or edema Pulses: 2+ and symmetric Skin: Skin color, texture, turgor normal. No rashes or lesions Neurologic: Grossly normal Psych: Pleasant  EKG: Minus rhythm with first-degree AV blocks and PACs, LAFB, voltage criteria for LVH at 62-personally reviewed  ASSESSMENT: History of acute diastolic  congestive heart failure (EF 60 to 65%, 06/2018) Dyslipidemia Hypertension Mild nonobstructive coronary disease (cath 06/2018) Small bowel disease Pneumatosis  PLAN: 1.   Andrea Brooks to be doing well from a cardiac standpoint.  No signs or symptoms of heart failure.  Her lipids are quite low as she has made significant dietary changes and really has difficulty eating due to chronic bowel issues.  She has an unusual bowel disorder that was diagnosed I believe at [facility].  Her as her cholesterol is very low, I think we could stop the Vytorin, specifically remove the ezetimibe which could have GI side effects and would continue simvastatin 20 mg daily.  Repeat lipids in 3 to 4 months.  Follow-up with me annually or sooner as necessary.  Pixie Casino, MD, [facility], [place] Director of the Advanced Lipid Disorders &  Cardiovascular Risk Reduction Clinic Diplomate of the American Board of Clinical Lipidology Attending Cardiologist  Direct Dial: [phone]  Fax: [phone]  Website:  [email or web address] 10/05/2022, 3:27 PM

## 2023-02-04 LAB — LIPID PANEL
Chol/HDL Ratio: 3.9 ratio (ref 0.0–4.4)
Cholesterol, Total: 118 mg/dL (ref 100–199)
HDL: 30 mg/dL — ABNORMAL LOW (ref >39)
LDL Chol Calc (NIH): 60 mg/dL (ref 0–99)
Triglycerides: 164 mg/dL — ABNORMAL HIGH (ref 0–149)
VLDL Cholesterol Cal: 28 mg/dL (ref 5–40)

## 2023-03-17 ENCOUNTER — Encounter: Payer: Self-pay | Admitting: Nurse Practitioner

## 2023-03-17 ENCOUNTER — Ambulatory Visit (INDEPENDENT_AMBULATORY_CARE_PROVIDER_SITE_OTHER): Payer: Medicare HMO | Admitting: Nurse Practitioner

## 2023-03-17 VITALS — BP 154/71 | HR 63 | Ht 63.0 in | Wt 142.0 lb

## 2023-03-17 DIAGNOSIS — I152 Hypertension secondary to endocrine disorders: Secondary | ICD-10-CM

## 2023-03-17 DIAGNOSIS — E1159 Type 2 diabetes mellitus with other circulatory complications: Secondary | ICD-10-CM

## 2023-03-17 DIAGNOSIS — E119 Type 2 diabetes mellitus without complications: Secondary | ICD-10-CM

## 2023-03-17 DIAGNOSIS — E785 Hyperlipidemia, unspecified: Secondary | ICD-10-CM

## 2023-03-17 DIAGNOSIS — F419 Anxiety disorder, unspecified: Secondary | ICD-10-CM

## 2023-03-17 LAB — POCT UA - MICROALBUMIN
Creatinine, POC: 200 mg/dL
Microalbumin Ur, POC: 80 mg/L

## 2023-03-17 LAB — POCT GLYCOSYLATED HEMOGLOBIN (HGB A1C): HbA1c POC (<> result, manual entry): 5.1 % (ref 4.0–5.6)

## 2023-03-17 MED ORDER — LORAZEPAM 1 MG PO TABS: ORAL_TABLET | ORAL | 1 refills | Status: DC

## 2023-03-17 NOTE — Progress Notes (Signed)
Established patient visit   Patient: Andrea Brooks   DOB: 1936/01/01   87 y.o. Female  MRN: [id number] Visit Date: 03/17/2023   Chief Complaint  Patient presents with   Medical Management of Chronic Issues   Subjective    HPI  Follow up  -DM2  --well managed via diet --stopped needing medication to control diabetes years ago  -blood pressure elevated today  -anxiety  --mostly at night  --when she lays down for sleep, she can't get her mind to slow down she she can sleep -take lorazepam 1 mg, mostly at night to settle her mind and help her sleep  --she does need a refil for this today  -She denies chest pain, chest pressure, or shortness of breath. She denies headaches or visual disturbances. She denies abdominal pain, nausea, vomiting, or changes in bowel or bladder habits.     Medications: Outpatient Medications Prior to Visit  Medication Sig   acetaminophen (TYLENOL) 500 MG tablet Take 1,000 mg by mouth every 6 (six) hours as needed for moderate pain.   alum & mag hydroxide-simeth (MAALOX/MYLANTA) 200-200-20 MG/5ML suspension Take 30 mLs by mouth every 6 (six) hours as needed for indigestion or heartburn.   aspirin EC 81 MG tablet Take 81 mg by mouth daily.   benzonatate (TESSALON) 100 MG capsule Take 1 capsule (100 mg total) by mouth every 8 (eight) hours as needed for cough.   calcium carbonate (TUMS - DOSED IN MG ELEMENTAL CALCIUM) 500 MG chewable tablet Chew 2 tablets by mouth as needed for indigestion or heartburn.   carvedilol (COREG) 6.25 MG tablet TAKE 1 TABLET TWICE A DAY WITH MEALS   dicyclomine (BENTYL) 10 MG capsule Take 1 capsule (10 mg total) by mouth 3 (three) times daily as needed for spasms (IBS flareup). Future refills need to come from GI   hydrocortisone (ANUSOL-HC) 2.5 % rectal cream Place 1 application rectally 2 (two) times daily.   ipratropium (ATROVENT) 0.06 % nasal spray Place 2 sprays into both nostrils 2 (two) times daily.   metoCLOPramide (REGLAN) 5  MG tablet Take 1 tablet (5 mg total) by mouth every 8 (eight) hours as needed for nausea.   metroNIDAZOLE (FLAGYL) 250 MG tablet Take 1 tablet (250 mg total) by mouth 3 (three) times daily. (Patient taking differently: Take 250 mg by mouth daily. 1 tablet daily)   Multiple Vitamins-Minerals (MULTI FOR HER 50+) TABS Take 1 tablet by mouth daily.   omeprazole (PRILOSEC) 20 MG capsule TAKE 1 CAPSULE DAILY 30 MINUTES BEFORE BREAKFAST   ondansetron (ZOFRAN-ODT) 4 MG disintegrating tablet Take 1 tablet (4 mg total) by mouth as needed for nausea or vomiting.   pyridostigmine (MESTINON) 60 MG tablet Take by mouth.   simvastatin (ZOCOR) 20 MG tablet Take 1 tablet (20 mg total) by mouth at bedtime.   valsartan (DIOVAN) 320 MG tablet TAKE 1 TABLET DAILY   [DISCONTINUED] LORazepam (ATIVAN) 1 MG tablet 1/2 tablet daily as needed for anxiety   No facility-administered medications prior to visit.    Review of Systems See HPI    Last CBC Lab Results  Component Value Date   WBC 7.3 09/15/2022   HGB 12.9 09/15/2022   HCT 39.2 09/15/2022   MCV 92 09/15/2022   MCH 30.3 09/15/2022   RDW 13.0 09/15/2022   PLT 199 09/15/2022   Last metabolic panel Lab Results  Component Value Date   GLUCOSE 109 (H) 09/15/2022   NA 140 09/15/2022   K 4.4 09/15/2022  CL 102 09/15/2022   CO2 26 09/15/2022   BUN 9 09/15/2022   CREATININE 0.75 09/15/2022   EGFR 78 09/15/2022   CALCIUM 9.2 09/15/2022   PHOS 3.5 03/16/2021   PROT 6.8 09/15/2022   ALBUMIN 3.9 09/15/2022   LABGLOB 2.9 09/15/2022   AGRATIO 1.3 09/15/2022   BILITOT 0.3 09/15/2022   ALKPHOS 76 09/15/2022   AST 17 09/15/2022   ALT 7 09/15/2022   ANIONGAP 5 03/16/2021   Last lipids Lab Results  Component Value Date   CHOL 118 02/04/2023   HDL 30 (L) 02/04/2023   LDLCALC 60 02/04/2023   TRIG 164 (H) 02/04/2023   CHOLHDL 3.9 02/04/2023   Last hemoglobin A1c Lab Results  Component Value Date   HGBA1C 5.1 03/17/2023   Last thyroid  functions Lab Results  Component Value Date   TSH 3.060 09/15/2022        Objective     Today's Vitals   03/17/23 1059 03/17/23 1138  BP: (Abnormal) 147/74 (Abnormal) 154/71  Pulse: 63   SpO2: 100%   Weight: 142 lb (64.4 kg)   Height: 5\' 3"  (1.6 m)    Body mass index is 25.15 kg/m.  BP Readings from Last 3 Encounters:  03/17/23 (Abnormal) 154/71  10/05/22 133/82  09/15/22 (Abnormal) 165/74    Wt Readings from Last 3 Encounters:  04/08/23 137 lb (62.1 kg)  03/17/23 142 lb (64.4 kg)  10/05/22 141 lb (64 kg)    Physical Exam Vitals and nursing note reviewed.  Constitutional:      Appearance: Normal appearance. She is well-developed.  HENT:     Head: Normocephalic and atraumatic.     Nose: Nose normal.     Mouth/Throat:     Mouth: Mucous membranes are moist.     Pharynx: Oropharynx is clear.  Eyes:     Extraocular Movements: Extraocular movements intact.     Conjunctiva/sclera: Conjunctivae normal.     Pupils: Pupils are equal, round, and reactive to light.  Neck:     Vascular: No carotid bruit.  Cardiovascular:     Rate and Rhythm: Normal rate and regular rhythm.     Pulses: Normal pulses.     Heart sounds: Normal heart sounds.  Pulmonary:     Effort: Pulmonary effort is normal.     Breath sounds: Normal breath sounds.  Abdominal:     Palpations: Abdomen is soft.  Musculoskeletal:        General: Normal range of motion.     Cervical back: Normal range of motion and neck supple.  Lymphadenopathy:     Cervical: No cervical adenopathy.  Skin:    General: Skin is warm and dry.     Capillary Refill: Capillary refill takes less than 2 seconds.  Neurological:     General: No focal deficit present.     Mental Status: She is alert and oriented to person, place, and time.  Psychiatric:        Mood and Affect: Mood normal.        Behavior: Behavior normal.        Thought Content: Thought content normal.        Judgment: Judgment normal.     Results for  orders placed or performed in visit on 03/17/23  POCT glycosylated hemoglobin (Hb A1C)  Result Value Ref Range   Hemoglobin A1C     HbA1c POC (<> result, manual entry) 5.1 4.0 - 5.6 %   HbA1c, POC (prediabetic range)  HbA1c, POC (controlled diabetic range)    POCT UA - Microalbumin  Result Value Ref Range   Microalbumin Ur, POC 80 mg/L   Creatinine, POC 200 mg/dL   Albumin/Creatinine Ratio, Urine, POC 30-300     Assessment & Plan    Hyperlipidemia, unspecified hyperlipidemia type -     POCT glycosylated hemoglobin (Hb A1C) -     POCT UA - Microalbumin  Hypertension associated with diabetes (HCC) -     POCT glycosylated hemoglobin (Hb A1C) -     POCT UA - Microalbumin  Diabetes mellitus without complication (HCC) -     POCT glycosylated hemoglobin (Hb A1C) -     POCT UA - Microalbumin  Anxiety -     LORazepam; 1/2 tablet daily as needed for anxiety  Dispense: 30 tablet; Refill: 1     Return in about 6 months (around 09/16/2023) for blood pressure, mood, FBW a week prior to visit with urine microalbumin .         Carlean Jews, NP  [facility] [phone] (phone) [phone] (fax)  [facility]

## 2023-04-06 ENCOUNTER — Telehealth: Payer: Self-pay

## 2023-04-06 NOTE — Telephone Encounter (Signed)
Contacted Miyo Ketelsen to schedule their annual wellness visit. Appointment made for 04/08/23.  Agnes Lawrence, CMA (AAMA)  [facility] [phone]

## 2023-04-08 ENCOUNTER — Ambulatory Visit (INDEPENDENT_AMBULATORY_CARE_PROVIDER_SITE_OTHER): Payer: Medicare HMO

## 2023-04-08 VITALS — Ht 63.0 in | Wt 137.0 lb

## 2023-04-08 DIAGNOSIS — Z0000 Encounter for general adult medical examination without abnormal findings: Secondary | ICD-10-CM | POA: Diagnosis not present

## 2023-04-08 NOTE — Progress Notes (Signed)
Subjective:   Andrea Brooks is a 87 y.o. female who presents for Medicare Annual (Subsequent) preventive examination.  Review of Systems    Virtual Visit via Telephone Note  I connected with  [name] on 04/08/23 at  2:30 PM EDT by telephone and verified that I am speaking with the correct person using two identifiers.  Location: Patient: Home Provider: Office Persons participating in the virtual visit: patient/Nurse Health Advisor   I discussed the limitations, risks, security and privacy concerns of performing an evaluation and management service by telephone and the availability of in person appointments. The patient expressed understanding and agreed to proceed.  Interactive audio and video telecommunications were attempted between this nurse and patient, however failed, due to patient having technical difficulties OR patient did not have access to video capability.  We continued and completed visit with audio only.  Some vital signs may be absent or patient reported.   [name], LPN  Cardiac Risk Factors include: advanced age (>31men, >61 women);hypertension     Objective:    Today's Vitals   04/08/23 1433  Weight: 137 lb (62.1 kg)  Height: 5\' 3"  (1.6 m)   Body mass index is 24.27 kg/m.     04/08/2023    2:48 PM 03/12/2021    1:46 AM 03/12/2021    1:00 AM 10/29/2019   12:50 PM 01/16/2019   11:50 AM 09/08/2018    3:33 AM 07/02/2018    8:19 PM  Advanced Directives  Does Patient Have a Medical Advance Directive? Yes  No Yes Yes No No  Type of Estate agent of [place];Living will   Healthcare Power of [place];Living will Living will;Healthcare Power of Attorney    Copy of Healthcare Power of Attorney in Chart? No - copy requested        Would patient like information on creating a medical advance directive?  No - Patient declined    No - Patient declined No - Patient declined    Current Medications (verified) Outpatient Encounter  Medications as of 04/08/2023  Medication Sig   acetaminophen (TYLENOL) 500 MG tablet Take 1,000 mg by mouth every 6 (six) hours as needed for moderate pain.   alum & mag hydroxide-simeth (MAALOX/MYLANTA) 200-200-20 MG/5ML suspension Take 30 mLs by mouth every 6 (six) hours as needed for indigestion or heartburn.   aspirin EC 81 MG tablet Take 81 mg by mouth daily.   benzonatate (TESSALON) 100 MG capsule Take 1 capsule (100 mg total) by mouth every 8 (eight) hours as needed for cough.   calcium carbonate (TUMS - DOSED IN MG ELEMENTAL CALCIUM) 500 MG chewable tablet Chew 2 tablets by mouth as needed for indigestion or heartburn.   carvedilol (COREG) 6.25 MG tablet TAKE 1 TABLET TWICE A DAY WITH MEALS   dicyclomine (BENTYL) 10 MG capsule Take 1 capsule (10 mg total) by mouth 3 (three) times daily as needed for spasms (IBS flareup). Future refills need to come from GI   hydrocortisone (ANUSOL-HC) 2.5 % rectal cream Place 1 application rectally 2 (two) times daily.   ipratropium (ATROVENT) 0.06 % nasal spray Place 2 sprays into both nostrils 2 (two) times daily.   LORazepam (ATIVAN) 1 MG tablet 1/2 tablet daily as needed for anxiety   metoCLOPramide (REGLAN) 5 MG tablet Take 1 tablet (5 mg total) by mouth every 8 (eight) hours as needed for nausea.   metroNIDAZOLE (FLAGYL) 250 MG tablet Take 1 tablet (250 mg total) by mouth 3 (three)  times daily. (Patient taking differently: Take 250 mg by mouth daily. 1 tablet daily)   Multiple Vitamins-Minerals (MULTI FOR HER 50+) TABS Take 1 tablet by mouth daily.   omeprazole (PRILOSEC) 20 MG capsule TAKE 1 CAPSULE DAILY 30 MINUTES BEFORE BREAKFAST   ondansetron (ZOFRAN-ODT) 4 MG disintegrating tablet Take 1 tablet (4 mg total) by mouth as needed for nausea or vomiting.   pyridostigmine (MESTINON) 60 MG tablet Take by mouth.   simvastatin (ZOCOR) 20 MG tablet Take 1 tablet (20 mg total) by mouth at bedtime.   valsartan (DIOVAN) 320 MG tablet TAKE 1 TABLET DAILY    No facility-administered encounter medications on file as of 04/08/2023.    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   Left forearm and Left forearm superior   Past Surgical History:  Procedure Laterality Date   ABDOMINAL HYSTERECTOMY     total   BREAST BIOPSY Left    neg   CHOLECYSTECTOMY     JOINT REPLACEMENT Bilateral    knee   LAPAROTOMY N/A 03/11/2021   Procedure: EXPLORATORY LAPAROTOMY;  Surgeon: Berna Bue, MD;  Location: [facility] OR;  Service: General;  Laterality: N/A;   LEFT HEART CATH AND CORONARY ANGIOGRAPHY N/A 07/04/2018   Procedure: LEFT HEART CATH AND CORONARY ANGIOGRAPHY;  Surgeon: Corky Crafts, MD;  Location: [facility] INVASIVE CV LAB;  Service: Cardiovascular;  Laterality: N/A;   REPLACEMENT TOTAL KNEE BILATERAL     Family History  Problem Relation Age of Onset   Hypertension Mother    Colon cancer Mother    Stroke Father    Heart failure Father    Stroke Sister    Stroke Brother    Graves' disease Daughter    Breast cancer Paternal Aunt    Graves' disease Sister    Bone cancer Sister    Breast cancer Maternal Aunt    Esophageal cancer Neg Hx    Rectal cancer Neg Hx    Liver cancer Neg Hx    Social History   Socioeconomic History   Marital status: Married    Spouse name: Not on file   Number of children: 1   Years of education: Not on file   Highest education level: Not on file  Occupational History   Occupation: retired  Tobacco Use   Smoking status: Former    Packs/day: 1.00    Years: 2.00    Additional pack years: 0.00    Total pack years: 2.00    Types: Cigarettes    Quit date:  11/24/1959    Years since quitting: 63.4   Smokeless tobacco: Never  Vaping Use   Vaping Use: Never used  Substance and Sexual Activity   Alcohol use: No   Drug use: No   Sexual activity: Not Currently  Other Topics Concern   Not on file  Social History Narrative   Not on file   Social Determinants of Health   Financial Resource Strain: Low Risk  (04/08/2023)   Overall Financial Resource Strain (CARDIA)    Difficulty of Paying Living Expenses: Not hard  at all  Food Insecurity: No Food Insecurity (04/08/2023)   Hunger Vital Sign    Worried About Running Out of Food in the Last Year: Never true    Ran Out of Food in the Last Year: Never true  Transportation Needs: No Transportation Needs (04/08/2023)   PRAPARE - Administrator, Civil Service (Medical): No    Lack of Transportation (Non-Medical): No  Physical Activity: Inactive (04/08/2023)   Exercise Vital Sign    Days of Exercise per Week: 0 days    Minutes of Exercise per Session: 0 min  Stress: No Stress Concern Present (04/08/2023)   [employer] of Occupational Health - Occupational Stress Questionnaire    Feeling of Stress : Not at all  Social Connections: Moderately Isolated (04/08/2023)   Social Connection and Isolation Panel [NHANES]    Frequency of Communication with Friends and Family: More than three times a week    Frequency of Social Gatherings with Friends and Family: More than three times a week    Attends Religious Services: Never    Database administrator or Organizations: No    Attends Engineer, structural: Never    Marital Status: Married    Tobacco Counseling Counseling given: Not Answered   Clinical Intake:  Pre-visit preparation completed: Yes  Pain : No/denies pain     BMI - recorded: 24.27 Nutritional Status: BMI of 19-24  Normal Nutritional Risks: None Diabetes: No Diabetic?   No  Interpreter Needed?: No  Information entered by :: Theresa Mulligan  LPN   Activities of Daily Living    04/08/2023    2:44 PM 04/07/2023   12:26 PM  In your present state of health, do you have any difficulty performing the following activities:  Hearing? 0 0  Vision? 0 0  Difficulty concentrating or making decisions? 0 0  Walking or climbing stairs? 1 1  Comment Uses cane and walker   Dressing or bathing? 0 0  Doing errands, shopping? 0 0  Preparing Food and eating ? N N  Using the Toilet? N N  In the past six months, have you accidently leaked urine? N Y  Do you have problems with loss of bowel control? Malvin Johns  Comment Wears breifs prn. Followed by medical attention at [facility] your Medications? N N  Managing your Finances? N N  Housekeeping or managing your Housekeeping? N N    Patient Care Team: Melida Quitter, PA as PCP - General (Family Medicine) Rennis Golden Lisette Abu, MD as PCP - Cardiology (Cardiology) Myrtie Neither Andreas Blower, MD as Consulting Physician (Gastroenterology)  Indicate any recent Medical Services you may have received from other than [facility] providers in the past year (date may be approximate).     Assessment:   This is a routine wellness examination for [place].  Hearing/Vision screen Hearing Screening - Comments:: Denies hearing difficulties   Vision Screening - Comments:: Wears rx glasses - up to date with routine eye exams with  [facility]   Dietary issues and exercise activities discussed: Exercise limited by: orthopedic condition(s)   Goals Addressed               This Visit's Progress     No current goals (pt-stated)        Stay Alive!       Depression Screen    04/08/2023    2:43 PM 03/17/2023   11:00 AM 09/15/2022    8:43 AM  03/13/2022   11:10 AM 09/11/2021    9:50 AM 05/09/2021   11:02 AM 01/07/2021   11:08 AM  PHQ 2/9 Scores  PHQ - 2 Score 0 0 0 0 0 0 0  PHQ- 9 Score 0 1 3 3 2 3 1     Fall Risk    04/08/2023    2:47 PM 04/07/2023   12:26 PM 03/17/2023   11:00 AM 09/15/2022     8:43 AM 09/11/2022    1:19 PM  Fall Risk   Falls in the past year? 0 0 0 0 0  Number falls in past yr: 0 0 0 0   Injury with Fall? 0 0 0 0   Risk for fall due to : No Fall Risks  No Fall Risks No Fall Risks   Follow up Falls prevention discussed  Falls evaluation completed Falls evaluation completed     FALL RISK PREVENTION PERTAINING TO THE HOME:  Any stairs in or around the home? Yes  If so, are there any without handrails? No  Home free of loose throw rugs in walkways, pet beds, electrical cords, etc? Yes  Adequate lighting in your home to reduce risk of falls? Yes   ASSISTIVE DEVICES UTILIZED TO PREVENT FALLS:  Life alert? No  Use of a cane, walker or w/c? Yes  Grab bars in the bathroom? Yes  Shower chair or bench in shower? Yes  Elevated toilet seat or a handicapped toilet? Yes   TIMED UP AND GO:  Was the test performed? No . Audio Visit   Cognitive Function:        04/08/2023    2:48 PM 09/15/2022    8:37 AM 05/09/2021   11:03 AM 04/04/2019    1:19 PM 06/15/2018   11:04 AM  6CIT Screen  What Year? 0 points 0 points 0 points 0 points 0 points  What month? 0 points 0 points 0 points 0 points 0 points  What time? 0 points 0 points 0 points 0 points 0 points  Count back from 20 0 points 0 points 0 points 0 points 0 points  Months in reverse 0 points 0 points 0 points 0 points 0 points  Repeat phrase 0 points 0 points 0 points 2 points 0 points  Total Score 0 points 0 points 0 points 2 points 0 points    Immunizations Immunization History  Administered Date(s) Administered   Fluad Quad(high Dose 65+) 11/11/2020, 09/05/2021, 10/12/2022   Influenza, High Dose Seasonal PF 09/19/2018, 10/02/2019   Influenza-Unspecified 10/05/2017, 10/02/2019   PFIZER(Purple Top)SARS-COV-2 Vaccination 01/21/2020, 02/12/2020   Pneumococcal Conjugate-13 10/08/2014, 10/05/2017   Pneumococcal Polysaccharide-23 09/08/1999   Tdap 07/14/2016   Zoster Recombinat (Shingrix) 09/22/2018,  01/11/2019, 06/09/2019   Zoster, Live 09/05/2012    TDAP status: Up to date  Flu Vaccine status: Up to date  Pneumococcal vaccine status: Up to date  Covid-19 vaccine status: Completed vaccines  Qualifies for Shingles Vaccine? Yes   Zostavax completed Yes   Shingrix Completed?: Yes  Screening Tests Health Maintenance  Topic Date Due   FOOT EXAM  09/11/2022   COVID-19 Vaccine (3 - 2023-24 season) 04/24/2023 (Originally 07/24/2022)   Pneumonia Vaccine 91+ Years old (3 of 3 - PPSV23 or PCV20) 04/07/2024 (Originally 10/05/2022)   INFLUENZA VACCINE  06/24/2023   OPHTHALMOLOGY EXAM  09/15/2023   Diabetic kidney evaluation - eGFR measurement  09/16/2023   HEMOGLOBIN A1C  09/16/2023   Diabetic kidney evaluation - Urine ACR  03/16/2024   Medicare  Annual Wellness (AWV)  04/07/2024   DTaP/Tdap/Td (2 - Td or Tdap) 07/14/2026   DEXA SCAN  Completed   Zoster Vaccines- Shingrix  Completed   HPV VACCINES  Aged Out    Health Maintenance  Health Maintenance Due  Topic Date Due   FOOT EXAM  09/11/2022    Colorectal cancer screening: No longer required.   Mammogram status: No longer required due to Age.  Bone Density status: Completed 10/12/18. Results reflect: Bone density results: OSTEOPOROSIS. Repeat every   years.  Lung Cancer Screening: (Low Dose CT Chest recommended if Age 90-80 years, 30 pack-year currently smoking OR have quit w/in 15years.) does not qualify.     Additional Screening:  Hepatitis C Screening: does not qualify; Completed   Vision Screening: Recommended annual ophthalmology exams for early detection of glaucoma and other disorders of the eye. Is the patient up to date with their annual eye exam?  Yes  Who is the provider or what is the name of the office in which the patient attends annual eye exams? [facility] If pt is not established with a provider, would they like to be referred to a provider to establish care? No .   Dental Screening:  Recommended annual dental exams for proper oral hygiene  Community Resource Referral / Chronic Care Management:  CRR required this visit?  No   CCM required this visit?  No      Plan:     I have personally reviewed and noted the following in the patient's chart:   Medical and social history Use of alcohol, tobacco or illicit drugs  Current medications and supplements including opioid prescriptions. Patient is not currently taking opioid prescriptions. Functional ability and status Nutritional status Physical activity Advanced directives List of other physicians Hospitalizations, surgeries, and ER visits in previous 12 months Vitals Screenings to include cognitive, depression, and falls Referrals and appointments  In addition, I have reviewed and discussed with patient certain preventive protocols, quality metrics, and best practice recommendations. A written personalized care plan for preventive services as well as general preventive health recommendations were provided to patient.     [name], LPN   03/01/8118   Nurse Notes: None

## 2023-04-08 NOTE — Patient Instructions (Addendum)
Andrea Brooks , Thank you for taking time to come for your Medicare Wellness Visit. I appreciate your ongoing commitment to your health goals. Please review the following plan we discussed and let me know if I can assist you in the future.   These are the goals we discussed:  Goals       No current goals (pt-stated)      Stay Alive!        This is a list of the screening recommended for you and due dates:  Health Maintenance  Topic Date Due   Complete foot exam   09/11/2022   COVID-19 Vaccine (3 - 2023-24 season) 04/24/2023*   Pneumonia Vaccine (3 of 3 - PPSV23 or PCV20) 04/07/2024*   Flu Shot  06/24/2023   Eye exam for diabetics  09/15/2023   Yearly kidney function blood test for diabetes  09/16/2023   Hemoglobin A1C  09/16/2023   Yearly kidney health urinalysis for diabetes  03/16/2024   Medicare Annual Wellness Visit  04/07/2024   DTaP/Tdap/Td vaccine (2 - Td or Tdap) 07/14/2026   DEXA scan (bone density measurement)  Completed   Zoster (Shingles) Vaccine  Completed   HPV Vaccine  Aged Out  *Topic was postponed. The date shown is not the original due date.    Advanced directives: Please bring a copy of your health care power of attorney and living will to the office to be added to your chart at your convenience.   Conditions/risks identified:   Next appointment: Follow up in one year for your annual wellness visit    Preventive Care 65 Years and Older, Female Preventive care refers to lifestyle choices and visits with your health care provider that can promote health and wellness. What does preventive care include? A yearly physical exam. This is also called an annual well check. Dental exams once or twice a year. Routine eye exams. Ask your health care provider how often you should have your eyes checked. Personal lifestyle choices, including: Daily care of your teeth and gums. Regular physical activity. Eating a healthy diet. Avoiding tobacco and drug use. Limiting  alcohol use. Practicing safe sex. Taking low-dose aspirin every day. Taking vitamin and mineral supplements as recommended by your health care provider. What happens during an annual well check? The services and screenings done by your health care provider during your annual well check will depend on your age, overall health, lifestyle risk factors, and family history of disease. Counseling  Your health care provider may ask you questions about your: Alcohol use. Tobacco use. Drug use. Emotional well-being. Home and relationship well-being. Sexual activity. Eating habits. History of falls. Memory and ability to understand (cognition). Work and work Astronomer. Reproductive health. Screening  You may have the following tests or measurements: Height, weight, and BMI. Blood pressure. Lipid and cholesterol levels. These may be checked every 5 years, or more frequently if you are over 31 years old. Skin check. Lung cancer screening. You may have this screening every year starting at age 42 if you have a 30-pack-year history of smoking and currently smoke or have quit within the past 15 years. Fecal occult blood test (FOBT) of the stool. You may have this test every year starting at age 18. Flexible sigmoidoscopy or colonoscopy. You may have a sigmoidoscopy every 5 years or a colonoscopy every 10 years starting at age 10. Hepatitis C blood test. Hepatitis B blood test. Sexually transmitted disease (STD) testing. Diabetes screening. This is done by checking your  blood sugar (glucose) after you have not eaten for a while (fasting). You may have this done every 1-3 years. Bone density scan. This is done to screen for osteoporosis. You may have this done starting at age 62. Mammogram. This may be done every 1-2 years. Talk to your health care provider about how often you should have regular mammograms. Talk with your health care provider about your test results, treatment options, and if  necessary, the need for more tests. Vaccines  Your health care provider may recommend certain vaccines, such as: Influenza vaccine. This is recommended every year. Tetanus, diphtheria, and acellular pertussis (Tdap, Td) vaccine. You may need a Td booster every 10 years. Zoster vaccine. You may need this after age 12. Pneumococcal 13-valent conjugate (PCV13) vaccine. One dose is recommended after age 33. Pneumococcal polysaccharide (PPSV23) vaccine. One dose is recommended after age 67. Talk to your health care provider about which screenings and vaccines you need and how often you need them. This information is not intended to replace advice given to you by your health care provider. Make sure you discuss any questions you have with your health care provider. Document Released: 12/06/2015 Document Revised: 07/29/2016 Document Reviewed: 09/10/2015 Elsevier Interactive Patient Education  2017 [place] Prevention in the Home Falls can cause injuries. They can happen to people of all ages. There are many things you can do to make your home safe and to help prevent falls. What can I do on the outside of my home? Regularly fix the edges of walkways and driveways and fix any cracks. Remove anything that might make you trip as you walk through a door, such as a raised step or threshold. Trim any bushes or trees on the path to your home. Use bright outdoor lighting. Clear any walking paths of anything that might make someone trip, such as rocks or tools. Regularly check to see if handrails are loose or broken. Make sure that both sides of any steps have handrails. Any raised decks and porches should have guardrails on the edges. Have any leaves, snow, or ice cleared regularly. Use sand or salt on walking paths during winter. Clean up any spills in your garage right away. This includes oil or grease spills. What can I do in the bathroom? Use night lights. Install grab bars by the toilet  and in the tub and shower. Do not use towel bars as grab bars. Use non-skid mats or decals in the tub or shower. If you need to sit down in the shower, use a plastic, non-slip stool. Keep the floor dry. Clean up any water that spills on the floor as soon as it happens. Remove soap buildup in the tub or shower regularly. Attach bath mats securely with double-sided non-slip rug tape. Do not have throw rugs and other things on the floor that can make you trip. What can I do in the bedroom? Use night lights. Make sure that you have a light by your bed that is easy to reach. Do not use any sheets or blankets that are too big for your bed. They should not hang down onto the floor. Have a firm chair that has side arms. You can use this for support while you get dressed. Do not have throw rugs and other things on the floor that can make you trip. What can I do in the kitchen? Clean up any spills right away. Avoid walking on wet floors. Keep items that you use a lot in  easy-to-reach places. If you need to reach something above you, use a strong step stool that has a grab bar. Keep electrical cords out of the way. Do not use floor polish or wax that makes floors slippery. If you must use wax, use non-skid floor wax. Do not have throw rugs and other things on the floor that can make you trip. What can I do with my stairs? Do not leave any items on the stairs. Make sure that there are handrails on both sides of the stairs and use them. Fix handrails that are broken or loose. Make sure that handrails are as long as the stairways. Check any carpeting to make sure that it is firmly attached to the stairs. Fix any carpet that is loose or worn. Avoid having throw rugs at the top or bottom of the stairs. If you do have throw rugs, attach them to the floor with carpet tape. Make sure that you have a light switch at the top of the stairs and the bottom of the stairs. If you do not have them, ask someone to add  them for you. What else can I do to help prevent falls? Wear shoes that: Do not have high heels. Have rubber bottoms. Are comfortable and fit you well. Are closed at the toe. Do not wear sandals. If you use a stepladder: Make sure that it is fully opened. Do not climb a closed stepladder. Make sure that both sides of the stepladder are locked into place. Ask someone to hold it for you, if possible. Clearly mark and make sure that you can see: Any grab bars or handrails. First and last steps. Where the edge of each step is. Use tools that help you move around (mobility aids) if they are needed. These include: Canes. Walkers. Scooters. Crutches. Turn on the lights when you go into a dark area. Replace any light bulbs as soon as they burn out. Set up your furniture so you have a clear path. Avoid moving your furniture around. If any of your floors are uneven, fix them. If there are any pets around you, be aware of where they are. Review your medicines with your doctor. Some medicines can make you feel dizzy. This can increase your chance of falling. Ask your doctor what other things that you can do to help prevent falls. This information is not intended to replace advice given to you by your health care provider. Make sure you discuss any questions you have with your health care provider. Document Released: 09/05/2009 Document Revised: 04/16/2016 Document Reviewed: 12/14/2014 Elsevier Interactive Patient Education  2017 [employer].

## 2023-04-09 NOTE — Assessment & Plan Note (Signed)
HgbA1c 5.1 today  -urine microalbumin elevated  -recommend she limit intake CHO and sugar.  -control with diet and exercise  -recheck in 6 months

## 2023-04-09 NOTE — Assessment & Plan Note (Signed)
May continue to take lorazepam as needed and as prescribed  -refills provided today

## 2023-04-09 NOTE — Assessment & Plan Note (Signed)
Blood pressure elevated, but generally well controlled.  ---generally well controlled

## 2023-04-22 NOTE — Addendum Note (Signed)
Addended by: Vincent Gros on: 04/22/2023 08:36 AM   Modules accepted: Level of Service

## 2023-05-18 ENCOUNTER — Ambulatory Visit: Payer: Medicare HMO | Admitting: Dermatology

## 2023-05-18 ENCOUNTER — Telehealth: Payer: Self-pay | Admitting: *Deleted

## 2023-05-18 VITALS — BP 132/68 | HR 63

## 2023-05-18 DIAGNOSIS — W908XXA Exposure to other nonionizing radiation, initial encounter: Secondary | ICD-10-CM | POA: Diagnosis not present

## 2023-05-18 DIAGNOSIS — L821 Other seborrheic keratosis: Secondary | ICD-10-CM

## 2023-05-18 DIAGNOSIS — E1159 Type 2 diabetes mellitus with other circulatory complications: Secondary | ICD-10-CM

## 2023-05-18 DIAGNOSIS — L570 Actinic keratosis: Secondary | ICD-10-CM | POA: Diagnosis not present

## 2023-05-18 DIAGNOSIS — Z85828 Personal history of other malignant neoplasm of skin: Secondary | ICD-10-CM

## 2023-05-18 DIAGNOSIS — L578 Other skin changes due to chronic exposure to nonionizing radiation: Secondary | ICD-10-CM | POA: Diagnosis not present

## 2023-05-18 DIAGNOSIS — Z1283 Encounter for screening for malignant neoplasm of skin: Secondary | ICD-10-CM | POA: Diagnosis not present

## 2023-05-18 DIAGNOSIS — I781 Nevus, non-neoplastic: Secondary | ICD-10-CM

## 2023-05-18 DIAGNOSIS — L814 Other melanin hyperpigmentation: Secondary | ICD-10-CM

## 2023-05-18 MED ORDER — VALSARTAN 320 MG PO TABS: 320.0000 mg | ORAL_TABLET | Freq: Every day | ORAL | 1 refills | Status: DC

## 2023-05-18 NOTE — Telephone Encounter (Signed)
Refill sent to [facility].  

## 2023-05-18 NOTE — Patient Instructions (Addendum)
Cryotherapy Aftercare  Wash gently with soap and water everyday.   Apply Vaseline and Band-Aid daily until healed.     Due to recent changes in healthcare laws, you may see results of your pathology and/or laboratory studies on MyChart before the doctors have had a chance to review them. We understand that in some cases there may be results that are confusing or concerning to you. Please understand that not all results are received at the same time and often the doctors may need to interpret multiple results in order to provide you with the best plan of care or course of treatment. Therefore, we ask that you please give us 2 business days to thoroughly review all your results before contacting the office for clarification. Should we see a critical lab result, you will be contacted sooner.   If You Need Anything After Your Visit  If you have any questions or concerns for your doctor, please call our main line at [phone] and press option 4 to reach your doctor's medical assistant. If no one answers, please leave a voicemail as directed and we will return your call as soon as possible. Messages left after 4 pm will be answered the following business day.   You may also send us a message via MyChart. We typically respond to MyChart messages within 1-2 business days.  For prescription refills, please ask your pharmacy to contact our office. Our fax number is [phone].  If you have an urgent issue when the clinic is closed that cannot wait until the next business day, you can page your doctor at the number below.    Please note that while we do our best to be available for urgent issues outside of office hours, we are not available 24/7.   If you have an urgent issue and are unable to reach us, you may choose to seek medical care at your doctor's office, retail clinic, urgent care center, or emergency room.  If you have a medical emergency, please immediately call 911 or go to the  emergency department.  Pager Numbers  - Dr. Kowalski: [phone]  - Dr. Moye: [phone]  - Dr. Stewart: [phone]  In the event of inclement weather, please call our main line at [phone] for an update on the status of any delays or closures.  Dermatology Medication Tips: Please keep the boxes that topical medications come in in order to help keep track of the instructions about where and how to use these. Pharmacies typically print the medication instructions only on the boxes and not directly on the medication tubes.   If your medication is too expensive, please contact our office at [phone] option 4 or send us a message through MyChart.   We are unable to tell what your co-pay for medications will be in advance as this is different depending on your insurance coverage. However, we may be able to find a substitute medication at lower cost or fill out paperwork to get insurance to cover a needed medication.   If a prior authorization is required to get your medication covered by your insurance company, please allow us 1-2 business days to complete this process.  Drug prices often vary depending on where the prescription is filled and some pharmacies may offer cheaper prices.  The website www.goodrx.com contains coupons for medications through different pharmacies. The prices here do not account for what the cost may be with help from insurance (it may be cheaper with your insurance), but the website can   give you the price if you did not use any insurance.  - You can print the associated coupon and take it with your prescription to the pharmacy.  - You may also stop by our office during regular business hours and pick up a GoodRx coupon card.  - If you need your prescription sent electronically to a different pharmacy, notify our office through [facility] MyChart or by phone at [phone] option 4.     Si Usted Necesita Algo Despus de Su Visita  Tambin puede  enviarnos un mensaje a travs de MyChart. Por lo general respondemos a los mensajes de MyChart en el transcurso de 1 a 2 das hbiles.  Para renovar recetas, por favor pida a su farmacia que se ponga en contacto con nuestra oficina. Nuestro nmero de fax es el [phone].  Si tiene un asunto urgente cuando la clnica est cerrada y que no puede esperar hasta el siguiente da hbil, puede llamar/localizar a su doctor(a) al nmero que aparece a continuacin.   Por favor, tenga en cuenta que aunque hacemos todo lo posible para estar disponibles para asuntos urgentes fuera del horario de oficina, no estamos disponibles las 24 horas del da, los 7 das de la semana.   Si tiene un problema urgente y no puede comunicarse con nosotros, puede optar por buscar atencin mdica  en el consultorio de su doctor(a), en una clnica privada, en un centro de atencin urgente o en una sala de emergencias.  Si tiene una emergencia mdica, por favor llame inmediatamente al 911 o vaya a la sala de emergencias.  Nmeros de bper  - Dr. Kowalski: [phone]  - Dra. Moye: [phone]  - Dra. Stewart: [phone]  En caso de inclemencias del tiempo, por favor llame a nuestra lnea principal al [phone] para una actualizacin sobre el estado de cualquier retraso o cierre.  Consejos para la medicacin en dermatologa: Por favor, guarde las cajas en las que vienen los medicamentos de uso tpico para ayudarle a seguir las instrucciones sobre dnde y cmo usarlos. Las farmacias generalmente imprimen las instrucciones del medicamento slo en las cajas y no directamente en los tubos del medicamento.   Si su medicamento es muy caro, por favor, pngase en contacto con nuestra oficina llamando al [phone] y presione la opcin 4 o envenos un mensaje a travs de MyChart.   No podemos decirle cul ser su copago por los medicamentos por adelantado ya que esto es diferente dependiendo de la cobertura de su seguro.  Sin embargo, es posible que podamos encontrar un medicamento sustituto a menor costo o llenar un formulario para que el seguro cubra el medicamento que se considera necesario.   Si se requiere una autorizacin previa para que su compaa de seguros cubra su medicamento, por favor permtanos de 1 a 2 das hbiles para completar este proceso.  Los precios de los medicamentos varan con frecuencia dependiendo del lugar de dnde se surte la receta y alguna farmacias pueden ofrecer precios ms baratos.  El sitio web www.goodrx.com tiene cupones para medicamentos de diferentes farmacias. Los precios aqu no tienen en cuenta lo que podra costar con la ayuda del seguro (puede ser ms barato con su seguro), pero el sitio web puede darle el precio si no utiliz ningn seguro.  - Puede imprimir el cupn correspondiente y llevarlo con su receta a la farmacia.  - Tambin puede pasar por nuestra oficina durante el horario de atencin regular y recoger una tarjeta de cupones de GoodRx.  -   Si necesita que su receta se enve electrnicamente a una farmacia diferente, informe a nuestra oficina a travs de MyChart de [facility] o por telfono llamando al [phone] y presione la opcin 4.  

## 2023-05-18 NOTE — Progress Notes (Signed)
New Patient Visit   Subjective  Andrea Brooks is a 87 y.o. female who presents for the following: New patient here to establish care. She has a few spots to check today on the nose, arms, leg, and chest. 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  The patient presents for Upper Body Skin Exam (UBSE) for skin cancer screening and mole check.  The patient has spots, moles and lesions to be evaluated, some may be new or changing.    The following portions of the chart were reviewed this encounter and updated as appropriate: medications, allergies, medical history  Review of Systems:  No other skin or systemic complaints except as noted in HPI or Assessment and Plan.  Objective  Well appearing patient in no apparent distress; mood and affect are within normal limits.  A focused examination was performed of the following areas: All areas above waist examined today, right leg  Relevant physical exam findings are noted in the Assessment and Plan.  R upper nasal dorsum x 1 Pink scaly macules.     Assessment & Plan   AK (actinic keratosis) R upper nasal dorsum x 1  Actinic keratoses are precancerous spots that appear secondary to cumulative UV radiation exposure/sun exposure over time. They are chronic with expected duration over 1 year. A portion of actinic keratoses will progress to squamous cell carcinoma of the skin. It is not possible to reliably predict which spots will progress to skin cancer and so treatment is recommended to prevent development of skin cancer.  Recommend daily broad spectrum sunscreen SPF 30+ to sun-exposed areas, reapply every 2 hours as needed.  Recommend staying in the shade or wearing long sleeves, sun glasses (UVA+UVB protection) and wide brim hats (4-inch brim around the entire circumference of the hat). Call for new or changing lesions.  Destruction of lesion - R upper nasal dorsum x 1  Destruction method: cryotherapy   Informed consent:  discussed and consent obtained   Lesion destroyed using liquid nitrogen: Yes   Region frozen until ice ball extended beyond lesion: Yes   Outcome: patient tolerated procedure well with no complications   Post-procedure details: wound care instructions given   Additional details:  Prior to procedure, discussed risks of blister formation, small wound, skin dyspigmentation, or rare scar following cryotherapy. Recommend Vaseline ointment to treated areas while healing.   ACTINIC DAMAGE - chronic, secondary to cumulative UV radiation exposure/sun exposure over time - diffuse scaly erythematous macules with underlying dyspigmentation - Recommend daily broad spectrum sunscreen SPF 30+ to sun-exposed areas, reapply every 2 hours as needed.  - Recommend staying in the shade or wearing long sleeves, sun glasses (UVA+UVB protection) and wide brim hats (4-inch brim around the entire circumference of the hat). - Call for new or changing lesions.  TELANGIECTASIA Exam: dilated blood vessel on the nasal tip  Treatment Plan: Benign appearing on exam Call for changes  LENTIGINES Exam: scattered tan macules Due to sun exposure Treatment Plan: Benign-appearing, observe. Recommend daily broad spectrum sunscreen SPF 30+ to sun-exposed areas, reapply every 2 hours as needed.  Call for any changes  SEBORRHEIC KERATOSIS - Stuck-on, waxy, tan-brown papules and/or plaques  - Benign-appearing - Discussed benign etiology and prognosis. - Observe - Call for any changes  HISTORY OF SQUAMOUS CELL CARCINOMA OF THE SKIN - No evidence of recurrence today - Recommend regular full body skin exams - Recommend daily broad spectrum sunscreen SPF 30+ to sun-exposed areas, reapply every 2 hours  as needed.  - Call if any new or changing lesions are noted between office visits   Skin cancer screening performed today.   Return in about 1 year (around 05/17/2024) for TBSE, Hx SCC, Hx AKs.  ICherlyn Labella, CMA, am  acting as scribe for Willeen Niece, MD .   Documentation: I have reviewed the above documentation for accuracy and completeness, and I agree with the above.  Willeen Niece, MD

## 2023-05-18 NOTE — Telephone Encounter (Signed)
Prescription Request  05/18/2023  LOV: 03/17/23 ROV: 09/16/23  She said the pharmacy told her that they were waiting to hear back from you.   What is the name of the medication or equipment? valsartan (DIOVAN) 320 MG tablet   Have you contacted your pharmacy to request a refill? Yes they need a new RX  Which pharmacy would you like this sent to? Patient notified that their request is being sent to the clinical staff for review and that they should receive a response within 2 business days. [facility] - [place], [place] - [address], [place] [address] Phone: [phone]  Fax: [phone]   Please advise at [facility] [phone]

## 2023-07-19 ENCOUNTER — Other Ambulatory Visit: Payer: Self-pay | Admitting: Internal Medicine

## 2023-07-22 ENCOUNTER — Emergency Department (HOSPITAL_BASED_OUTPATIENT_CLINIC_OR_DEPARTMENT_OTHER): Payer: Medicare HMO

## 2023-07-22 ENCOUNTER — Emergency Department (HOSPITAL_BASED_OUTPATIENT_CLINIC_OR_DEPARTMENT_OTHER)
Admission: EM | Admit: 2023-07-22 | Discharge: 2023-07-22 | Disposition: A | Discharge status: Home / Self Care | Payer: Medicare HMO | Attending: Emergency Medicine | Admitting: Emergency Medicine

## 2023-07-22 ENCOUNTER — Other Ambulatory Visit: Payer: Self-pay

## 2023-07-22 ENCOUNTER — Encounter (HOSPITAL_BASED_OUTPATIENT_CLINIC_OR_DEPARTMENT_OTHER): Payer: Self-pay | Admitting: Emergency Medicine

## 2023-07-22 DIAGNOSIS — R079 Chest pain, unspecified: Secondary | ICD-10-CM

## 2023-07-22 DIAGNOSIS — R509 Fever, unspecified: Secondary | ICD-10-CM | POA: Diagnosis not present

## 2023-07-22 DIAGNOSIS — Z7982 Long term (current) use of aspirin: Secondary | ICD-10-CM | POA: Diagnosis not present

## 2023-07-22 DIAGNOSIS — I7121 Aneurysm of the ascending aorta, without rupture: Secondary | ICD-10-CM | POA: Diagnosis not present

## 2023-07-22 DIAGNOSIS — Z1152 Encounter for screening for COVID-19: Secondary | ICD-10-CM | POA: Insufficient documentation

## 2023-07-22 DIAGNOSIS — R0602 Shortness of breath: Secondary | ICD-10-CM | POA: Insufficient documentation

## 2023-07-22 LAB — COMPREHENSIVE METABOLIC PANEL
ALT: 11 U/L (ref 0–44)
AST: 18 U/L (ref 15–41)
Albumin: 2.9 g/dL — ABNORMAL LOW (ref 3.5–5.0)
Alkaline Phosphatase: 56 U/L (ref 38–126)
Anion gap: 9 (ref 5–15)
BUN: 13 mg/dL (ref 8–23)
CO2: 26 mmol/L (ref 22–32)
Calcium: 6.9 mg/dL — ABNORMAL LOW (ref 8.9–10.3)
Chloride: 105 mmol/L (ref 98–111)
Creatinine, Ser: 0.72 mg/dL (ref 0.44–1.00)
GFR, Estimated: >60 mL/min (ref >60)
Glucose, Bld: 116 mg/dL — ABNORMAL HIGH (ref 70–99)
Potassium: 3.4 mmol/L — ABNORMAL LOW (ref 3.5–5.1)
Sodium: 140 mmol/L (ref 135–145)
Total Bilirubin: 0.5 mg/dL (ref 0.3–1.2)
Total Protein: 6.9 g/dL (ref 6.5–8.1)

## 2023-07-22 LAB — CBC
HCT: 36.9 % (ref 36.0–46.0)
Hemoglobin: 12.1 g/dL (ref 12.0–15.0)
MCH: 29.7 pg (ref 26.0–34.0)
MCHC: 32.8 g/dL (ref 30.0–36.0)
MCV: 90.7 fL (ref 80.0–100.0)
Platelets: 208 10*3/uL (ref 150–400)
RBC: 4.07 MIL/uL (ref 3.87–5.11)
RDW: 13.5 % (ref 11.5–15.5)
WBC: 8.6 10*3/uL (ref 4.0–10.5)
nRBC: 0 % (ref 0.0–0.2)

## 2023-07-22 LAB — RESP PANEL BY RT-PCR (RSV, FLU A&B, COVID)  RVPGX2
Influenza A by PCR: NEGATIVE
Influenza B by PCR: NEGATIVE
Resp Syncytial Virus by PCR: NEGATIVE
SARS Coronavirus 2 by RT PCR: NEGATIVE

## 2023-07-22 LAB — TROPONIN I (HIGH SENSITIVITY)
Troponin I (High Sensitivity): 57 ng/L — ABNORMAL HIGH (ref <18)
Troponin I (High Sensitivity): 55 ng/L — ABNORMAL HIGH (ref <18)

## 2023-07-22 MED ORDER — IOHEXOL 350 MG/ML SOLN
100.0000 mL | Freq: Once | INTRAVENOUS | Status: AC | PRN
  Administered 2023-07-22: 75 mL via INTRAVENOUS

## 2023-07-22 NOTE — ED Triage Notes (Addendum)
Sob and cp that started yesterday and hurts to take a deep breath like a spasm she states  states lives with family that has covid

## 2023-07-22 NOTE — ED Provider Notes (Signed)
[facility] Provider Note   CSN: [id number] Arrival date & time: 07/22/23  1056     History  Chief Complaint  Patient presents with   Chest Pain   Shortness of Breath    Andrea Brooks is a 87 y.o. female.  He is here with some sharp left-sided chest pain that been going on for almost a week.  She said she had worked very hard in the garage doing a lot of cleaning and then was sore all over from it.  Symptoms were steadily getting better until yesterday when she began experiencing sharp stabbing left-sided chest pain especially with taking a deep breath.  Symptoms continued today.  No cough.  She thinks she might of had a fever.  She has chronic intermittent abdominal distention and diarrhea.  The history is provided by the patient.  Chest Pain Pain location:  L chest Pain quality: stabbing   Pain severity:  Moderate Onset quality:  Gradual Duration:  5 days Timing:  Intermittent Progression:  Unchanged Chronicity:  New Context: breathing   Relieved by:  Nothing Worsened by:  Certain positions and deep breathing Ineffective treatments:  Rest Associated symptoms: fever and shortness of breath   Associated symptoms: no abdominal pain, no cough, no nausea and no vomiting   Shortness of Breath Associated symptoms: chest pain and fever   Associated symptoms: no abdominal pain, no cough and no vomiting        Home Medications Prior to Admission medications   Medication Sig Start Date End Date Taking? Authorizing Provider  acetaminophen (TYLENOL) 500 MG tablet Take 1,000 mg by mouth every 6 (six) hours as needed for moderate pain.    [provider]  alum & mag hydroxide-simeth (MAALOX/MYLANTA) 200-200-20 MG/5ML suspension Take 30 mLs by mouth every 6 (six) hours as needed for indigestion or heartburn.    [provider]  aspirin EC 81 MG tablet Take 81 mg by mouth daily.    [provider]  benzonatate  (TESSALON) 100 MG capsule Take 1 capsule (100 mg total) by mouth every 8 (eight) hours as needed for cough. 05/09/22   Gustavus Bryant, FNP  calcium carbonate (TUMS - DOSED IN MG ELEMENTAL CALCIUM) 500 MG chewable tablet Chew 2 tablets by mouth as needed for indigestion or heartburn.    [provider]  carvedilol (COREG) 6.25 MG tablet TAKE 1 TABLET TWICE A DAY WITH MEALS 07/20/23   Hilty, Lisette Abu, MD  dicyclomine (BENTYL) 10 MG capsule Take 1 capsule (10 mg total) by mouth 3 (three) times daily as needed for spasms (IBS flareup). Future refills need to come from GI 01/24/21   Zehr, Shanda Bumps D, PA-C  ipratropium (ATROVENT) 0.06 % nasal spray Place 2 sprays into both nostrils 2 (two) times daily. 07/09/21   [provider]  LORazepam (ATIVAN) 1 MG tablet 1/2 tablet daily as needed for anxiety 03/17/23   Carlean Jews, NP  metoCLOPramide (REGLAN) 5 MG tablet Take 1 tablet (5 mg total) by mouth every 8 (eight) hours as needed for nausea. 02/16/19   Danford, Orpha Bur D, NP  metroNIDAZOLE (FLAGYL) 250 MG tablet Take 1 tablet (250 mg total) by mouth 3 (three) times daily. Patient taking differently: Take 250 mg by mouth daily. 1 tablet daily 12/01/21   Sherrilyn Rist, MD  Multiple Vitamins-Minerals (MULTI FOR HER 50+) TABS Take 1 tablet by mouth daily.    [provider]  omeprazole (PRILOSEC) 20  MG capsule TAKE 1 CAPSULE DAILY 30 MINUTES BEFORE BREAKFAST 09/21/22   Zehr, Shanda Bumps D, PA-C  ondansetron (ZOFRAN-ODT) 4 MG disintegrating tablet Take 1 tablet (4 mg total) by mouth as needed for nausea or vomiting. 12/01/21   Sherrilyn Rist, MD  pyridostigmine (MESTINON) 60 MG tablet Take by mouth. 02/03/22   [provider]  simvastatin (ZOCOR) 20 MG tablet Take 1 tablet (20 mg total) by mouth at bedtime. 10/05/22 09/30/23  Chrystie Nose, MD  valsartan (DIOVAN) 320 MG tablet Take 1 tablet (320 mg total) by mouth daily. 05/18/23   Melida Quitter, PA      Allergies    Tape     Review of Systems   Review of Systems  Constitutional:  Positive for fever.  Respiratory:  Positive for shortness of breath. Negative for cough.   Cardiovascular:  Positive for chest pain.  Gastrointestinal:  Negative for abdominal pain, nausea and vomiting.    Physical Exam Updated Vital Signs BP (!) 164/73 (BP Location: Left Arm)   Pulse 74   Temp 98.2 F (36.8 C) (Oral)   Resp (!) 24   Ht 5\' 3"  (1.6 m)   Wt 62.1 kg   SpO2 100%   BMI 24.25 kg/m  Physical Exam Vitals and nursing note reviewed.  Constitutional:      General: She is not in acute distress.    Appearance: She is well-developed.  HENT:     Head: Normocephalic and atraumatic.  Eyes:     Conjunctiva/sclera: Conjunctivae normal.  Cardiovascular:     Rate and Rhythm: Normal rate and regular rhythm.     Heart sounds: Normal heart sounds. No murmur heard. Pulmonary:     Effort: Pulmonary effort is normal. No respiratory distress.     Breath sounds: Rhonchi (few scattered) present.  Abdominal:     Palpations: Abdomen is soft.     Tenderness: There is no abdominal tenderness.  Musculoskeletal:        General: No swelling. Normal range of motion.     Cervical back: Neck supple.     Right lower leg: No tenderness. No edema.     Left lower leg: No tenderness. No edema.  Skin:    General: Skin is warm and dry.     Capillary Refill: Capillary refill takes less than 2 seconds.  Neurological:     General: No focal deficit present.     Mental Status: She is alert.  Psychiatric:        Mood and Affect: Mood normal.     ED Results / Procedures / Treatments   Labs (all labs ordered are listed, but only abnormal results are displayed) Labs Reviewed  COMPREHENSIVE METABOLIC PANEL - Abnormal; Notable for the following components:      Result Value   Potassium 3.4 (*)    Glucose, Bld 116 (*)    Calcium 6.9 (*)    Albumin 2.9 (*)    All other components within normal limits  TROPONIN I (HIGH SENSITIVITY) -  Abnormal; Notable for the following components:   Troponin I (High Sensitivity) 57 (*)    All other components within normal limits  TROPONIN I (HIGH SENSITIVITY) - Abnormal; Notable for the following components:   Troponin I (High Sensitivity) 55 (*)    All other components within normal limits  RESP PANEL BY RT-PCR (RSV, FLU A&B, COVID)  [id number]  CBC    EKG EKG Interpretation Date/Time:  Thursday July 22 2023 11:13:11 EDT  Ventricular Rate:  73 PR Interval:  248 QRS Duration:  104 QT Interval:  411 QTC Calculation: 453 R Axis:   -67  Text Interpretation: Sinus rhythm Atrial premature complex Prolonged PR interval Left ventricular hypertrophy Inferior infarct, old Anterior infarct, old Confirmed by Meridee Score [phone]) on 07/22/2023 11:15:10 AM  Radiology CT Angio Chest PE W/Cm &/Or Wo Cm  Result Date: 07/22/2023 CLINICAL DATA:  One day history of shortness of breath and pleuritic chest pain EXAM: CT ANGIOGRAPHY CHEST WITH CONTRAST TECHNIQUE: Multidetector CT imaging of the chest was performed using the standard protocol during bolus administration of intravenous contrast. Multiplanar CT image reconstructions and MIPs were obtained to evaluate the vascular anatomy. RADIATION DOSE REDUCTION: This exam was performed according to the departmental dose-optimization program which includes automated exposure control, adjustment of the mA and/or kV according to patient size and/or use of iterative reconstruction technique. CONTRAST:  75mL OMNIPAQUE IOHEXOL 350 MG/ML SOLN COMPARISON:  Chest radiograph dated 07/22/2023, CTA chest dated 07/02/2018 FINDINGS: Cardiovascular: The study is high quality for the evaluation of pulmonary embolism. There are no filling defects in the central, lobar, segmental or subsegmental pulmonary artery branches to suggest acute pulmonary embolism. Ascending thoracic aorta measures 4.3 x 4.3 cm, previously 4.2 x 4.1 cm. Multichamber cardiomegaly. No significant  pericardial fluid/thickening. Coronary artery calcifications and aortic atherosclerosis. Mediastinum/Nodes: Imaged thyroid gland without nodules meeting criteria for imaging follow-up by size. Normal esophagus. 1.0 cm subcarinal lymph node (301:47). Lungs/Pleura: The central airways are patent. Mosaic ground-glass densities with mild interlobular septal thickening. No focal consolidation. No pneumothorax. Small left pleural effusion. Upper abdomen: Normal. Musculoskeletal: No acute or abnormal lytic or blastic osseous lesions. Multilevel degenerative changes of the thoracic spine. Review of the MIP images confirms the above findings. IMPRESSION: 1. No evidence of pulmonary embolism. 2. Cardiomegaly with mild interstitial pulmonary edema and small left pleural effusion. 3. Subcarinal lymphadenopathy, likely reactive. 4. Ascending thoracic aortic aneurysm measures 4.3 cm, not substantially changed. Recommend annual imaging followup by CTA or MRA. This recommendation follows 2010 ACCF/AHA/AATS/ACR/ASA/SCA/SCAI/SIR/STS/SVM Guidelines for the Diagnosis and Management of Patients with Thoracic Aortic Disease. Circulation. 2010; 121: W119-J478. Aortic aneurysm NOS (ICD10-I71.9) 5. Aortic Atherosclerosis (ICD10-I70.0). Coronary artery calcifications. Assessment for potential risk factor modification, dietary therapy or pharmacologic therapy may be warranted, if clinically indicated. Electronically Signed   By: Agustin Cree M.D.   On: 07/22/2023 14:56   DG Chest Port 1 View  Result Date: 07/22/2023 CLINICAL DATA:  Chest pain. EXAM: PORTABLE CHEST 1 VIEW COMPARISON:  X-ray 08/25/2020 FINDINGS: Underinflation. Diffuse interstitial changes. Tiny left effusion. No pneumothorax. Slightly more focal opacity in the left lung base. Overlapping cardiac leads. Stable cardiopericardial silhouette. Film is under penetrated. IMPRESSION: Underinflation with some interstitial changes, chronic. Slightly more focal subtle opacity in the  left lung base with a tiny left effusion. Acute process is possible. Recommend follow-up Electronically Signed   By: Karen Kays M.D.   On: 07/22/2023 12:45    Procedures Procedures    Medications Ordered in ED Medications  iohexol (OMNIPAQUE) 350 MG/ML injection 100 mL (75 mLs Intravenous Contrast Given 07/22/23 1408)    ED Course/ Medical Decision Making/ A&P Clinical Course as of 07/22/23 1710  Thu Jul 22, 2023  1508 Reviewed results of workup with patient.  She was unaware of the ascending aneurysm.  It does not sound like that is what is causing her pain right now and they said the size is unchanged from prior imaging.  It seems very muscular,  she says it only occurs now when she assumes a specific position.  She is comfortable plan for discharge.  She has seen cardiology in the past and had a catheterization found minimal blockage so we will refer her back there.  Return instructions discussed [MB]    Clinical Course User Index [MB] Terrilee Files, MD                                 Medical Decision Making Amount and/or Complexity of Data Reviewed Labs: ordered. Radiology: ordered.  Risk Prescription drug management.   This patient complains of sharp left-sided chest pain; this involves an extensive number of treatment Options and is a complaint that carries with it a high risk of complications and morbidity. The differential includes musculoskeletal pain, ACS, rib injury, pneumothorax, vascular, PE  I ordered, reviewed and interpreted labs, which included CBC normal chemistries [place] low potassium elevated glucose, troponins elevated although flat, COVID and flu negative I ordered imaging studies which included chest x-ray and CT angio chest and I independently    visualized and interpreted imaging which showed no acute findings.  Does have some cardiomegaly and possibly little bit of fluid overload.  Has unchanged descending thoracic aneurysm.  No PE. Additional  history obtained from patient's companion Previous records obtained and reviewed in epic including prior cardiology notes Cardiac monitoring reviewed, sinus rhythm Social determinants considered, decreased physical activity Critical Interventions: None  After the interventions stated above, I reevaluated the patient and found patient to be pain-free except when she moves in a specific position then she will get the pain again. Admission and further testing considered, we discussed admission to the hospital versus home and symptomatic treatment.  Patient states she would rather go home.  I will put in a referral for her to closely follow-up with cardiology.  Return instructions discussed.         Final Clinical Impression(s) / ED Diagnoses Final diagnoses:  Nonspecific chest pain  Aneurysm of ascending aorta without rupture [facility])    Rx / DC Orders ED Discharge Orders     None         Terrilee Files, MD 07/22/23 1712

## 2023-07-22 NOTE — ED Notes (Signed)
ED Provider at bedside. 

## 2023-07-22 NOTE — Discharge Instructions (Signed)
You were seen in the emergency department for some left-sided chest pain.  You had blood work COVID testing EKG chest x-ray and a CAT scan of your chest that did not show a definite explanation for your symptoms.  You did have an aneurysm that will need to be monitored.  I have put a referral in for you to follow-up with cardiology.  Please continue your regular medications.  Return to the emergency department if any worsening or concerning symptoms.

## 2023-08-02 ENCOUNTER — Telehealth: Payer: Self-pay | Admitting: Internal Medicine

## 2023-08-02 NOTE — Telephone Encounter (Signed)
Patient states she has been having some SOB and when she takes a deep breath she gets a sharp pain that goes under her rib cage and into her neck. She has tingling in her hands and sometime in her lip. She states she went to ED on 8/29 because she felt like she strained a muscle in her chest because of some things she has done. She would like for you to review the EKG and wanted to make sure this was not heart related. I scheduled her with APP for 9/16

## 2023-08-02 NOTE — Telephone Encounter (Signed)
Pt states she has a tingling feeling in her hands and her hands shake when she goes to eat. Please advise

## 2023-08-04 ENCOUNTER — Inpatient Hospital Stay (HOSPITAL_COMMUNITY)
Admission: EM | Admit: 2023-08-04 | Discharge: 2023-08-09 | DRG: 640 | Disposition: A | Discharge status: Home / Self Care | Payer: Medicare HMO | Attending: Internal Medicine | Admitting: Internal Medicine

## 2023-08-04 ENCOUNTER — Other Ambulatory Visit: Payer: Self-pay

## 2023-08-04 ENCOUNTER — Emergency Department (HOSPITAL_COMMUNITY): Payer: Medicare HMO

## 2023-08-04 DIAGNOSIS — K219 Gastro-esophageal reflux disease without esophagitis: Secondary | ICD-10-CM | POA: Diagnosis present

## 2023-08-04 DIAGNOSIS — I5033 Acute on chronic diastolic (congestive) heart failure: Secondary | ICD-10-CM | POA: Diagnosis not present

## 2023-08-04 DIAGNOSIS — Z800 Family history of malignant neoplasm of digestive organs: Secondary | ICD-10-CM

## 2023-08-04 DIAGNOSIS — E1159 Type 2 diabetes mellitus with other circulatory complications: Secondary | ICD-10-CM

## 2023-08-04 DIAGNOSIS — Z8719 Personal history of other diseases of the digestive system: Secondary | ICD-10-CM

## 2023-08-04 DIAGNOSIS — Z87891 Personal history of nicotine dependence: Secondary | ICD-10-CM

## 2023-08-04 DIAGNOSIS — Z90710 Acquired absence of both cervix and uterus: Secondary | ICD-10-CM

## 2023-08-04 DIAGNOSIS — E785 Hyperlipidemia, unspecified: Secondary | ICD-10-CM | POA: Diagnosis present

## 2023-08-04 DIAGNOSIS — E876 Hypokalemia: Principal | ICD-10-CM

## 2023-08-04 DIAGNOSIS — I152 Hypertension secondary to endocrine disorders: Secondary | ICD-10-CM

## 2023-08-04 DIAGNOSIS — I110 Hypertensive heart disease with heart failure: Secondary | ICD-10-CM | POA: Diagnosis present

## 2023-08-04 DIAGNOSIS — E119 Type 2 diabetes mellitus without complications: Secondary | ICD-10-CM | POA: Diagnosis present

## 2023-08-04 DIAGNOSIS — Z96653 Presence of artificial knee joint, bilateral: Secondary | ICD-10-CM | POA: Diagnosis present

## 2023-08-04 DIAGNOSIS — Z823 Family history of stroke: Secondary | ICD-10-CM

## 2023-08-04 DIAGNOSIS — Z803 Family history of malignant neoplasm of breast: Secondary | ICD-10-CM

## 2023-08-04 DIAGNOSIS — J189 Pneumonia, unspecified organism: Secondary | ICD-10-CM

## 2023-08-04 DIAGNOSIS — Z7982 Long term (current) use of aspirin: Secondary | ICD-10-CM

## 2023-08-04 DIAGNOSIS — R079 Chest pain, unspecified: Secondary | ICD-10-CM | POA: Diagnosis not present

## 2023-08-04 DIAGNOSIS — Z1152 Encounter for screening for COVID-19: Secondary | ICD-10-CM

## 2023-08-04 DIAGNOSIS — R202 Paresthesia of skin: Principal | ICD-10-CM

## 2023-08-04 DIAGNOSIS — Z79899 Other long term (current) drug therapy: Secondary | ICD-10-CM

## 2023-08-04 DIAGNOSIS — Z9049 Acquired absence of other specified parts of digestive tract: Secondary | ICD-10-CM

## 2023-08-04 DIAGNOSIS — Z888 Allergy status to other drugs, medicaments and biological substances status: Secondary | ICD-10-CM

## 2023-08-04 DIAGNOSIS — Z86008 Personal history of in-situ neoplasm of other site: Secondary | ICD-10-CM

## 2023-08-04 DIAGNOSIS — E440 Moderate protein-calorie malnutrition: Secondary | ICD-10-CM | POA: Diagnosis present

## 2023-08-04 DIAGNOSIS — Z8249 Family history of ischemic heart disease and other diseases of the circulatory system: Secondary | ICD-10-CM

## 2023-08-04 DIAGNOSIS — K638219 Small intestinal bacterial overgrowth, unspecified: Secondary | ICD-10-CM | POA: Diagnosis present

## 2023-08-04 LAB — BASIC METABOLIC PANEL
Anion gap: 14 (ref 5–15)
BUN: 17 mg/dL (ref 8–23)
CO2: 25 mmol/L (ref 22–32)
Calcium: 5.9 mg/dL — CL (ref 8.9–10.3)
Chloride: 101 mmol/L (ref 98–111)
Creatinine, Ser: 1.09 mg/dL — ABNORMAL HIGH (ref 0.44–1.00)
GFR, Estimated: 50 mL/min — ABNORMAL LOW (ref >60)
Glucose, Bld: 183 mg/dL — ABNORMAL HIGH (ref 70–99)
Potassium: 3 mmol/L — ABNORMAL LOW (ref 3.5–5.1)
Sodium: 140 mmol/L (ref 135–145)

## 2023-08-04 LAB — HEPATIC FUNCTION PANEL
ALT: 12 U/L (ref 0–44)
AST: 21 U/L (ref 15–41)
Albumin: 2.6 g/dL — ABNORMAL LOW (ref 3.5–5.0)
Alkaline Phosphatase: 50 U/L (ref 38–126)
Bilirubin, Direct: 0.2 mg/dL (ref 0.0–0.2)
Indirect Bilirubin: 0.5 mg/dL (ref 0.3–0.9)
Total Bilirubin: 0.7 mg/dL (ref 0.3–1.2)
Total Protein: 6.9 g/dL (ref 6.5–8.1)

## 2023-08-04 LAB — CBC
HCT: 41.9 % (ref 36.0–46.0)
Hemoglobin: 13.5 g/dL (ref 12.0–15.0)
MCH: 29.9 pg (ref 26.0–34.0)
MCHC: 32.2 g/dL (ref 30.0–36.0)
MCV: 92.7 fL (ref 80.0–100.0)
Platelets: 256 10*3/uL (ref 150–400)
RBC: 4.52 MIL/uL (ref 3.87–5.11)
RDW: 13.8 % (ref 11.5–15.5)
WBC: 25.1 10*3/uL — ABNORMAL HIGH (ref 4.0–10.5)
nRBC: 0 % (ref 0.0–0.2)

## 2023-08-04 LAB — URINALYSIS, ROUTINE W REFLEX MICROSCOPIC
Bilirubin Urine: NEGATIVE
Glucose, UA: NEGATIVE mg/dL
Hgb urine dipstick: NEGATIVE
Ketones, ur: 20 mg/dL — AB
Nitrite: NEGATIVE
Protein, ur: 30 mg/dL — AB
Specific Gravity, Urine: 1.026 (ref 1.005–1.030)
pH: 5 (ref 5.0–8.0)

## 2023-08-04 LAB — CBG MONITORING, ED: Glucose-Capillary: 97 mg/dL (ref 70–99)

## 2023-08-04 LAB — PROCALCITONIN: Procalcitonin: 0.76 ng/mL

## 2023-08-04 LAB — GLUCOSE, CAPILLARY: Glucose-Capillary: 212 mg/dL — ABNORMAL HIGH (ref 70–99)

## 2023-08-04 LAB — SARS CORONAVIRUS 2 BY RT PCR: SARS Coronavirus 2 by RT PCR: NEGATIVE

## 2023-08-04 LAB — TROPONIN I (HIGH SENSITIVITY)
Troponin I (High Sensitivity): 93 ng/L — ABNORMAL HIGH (ref <18)
Troponin I (High Sensitivity): 96 ng/L — ABNORMAL HIGH (ref <18)

## 2023-08-04 MED ORDER — HYDRALAZINE HCL 20 MG/ML IJ SOLN: 10.0000 mg | Freq: Four times a day (QID) | INTRAMUSCULAR | Status: DC | PRN

## 2023-08-04 MED ORDER — SIMVASTATIN 20 MG PO TABS
20.0000 mg | ORAL_TABLET | Freq: Every day | ORAL | Status: DC
  Administered 2023-08-05 – 2023-08-08 (×4): 20 mg via ORAL
  Filled 2023-08-04 (×4): qty 1

## 2023-08-04 MED ORDER — ALBUTEROL SULFATE (2.5 MG/3ML) 0.083% IN NEBU
2.5000 mg | INHALATION_SOLUTION | Freq: Four times a day (QID) | RESPIRATORY_TRACT | Status: DC
  Administered 2023-08-04 – 2023-08-05 (×2): 2.5 mg via RESPIRATORY_TRACT
  Filled 2023-08-04 (×2): qty 3

## 2023-08-04 MED ORDER — ASPIRIN 81 MG PO TBEC
81.0000 mg | DELAYED_RELEASE_TABLET | Freq: Every day | ORAL | Status: DC
  Administered 2023-08-05 – 2023-08-09 (×5): 81 mg via ORAL
  Filled 2023-08-04 (×5): qty 1

## 2023-08-04 MED ORDER — MAGNESIUM SULFATE 4 GM/100ML IV SOLN
4.0000 g | Freq: Once | INTRAVENOUS | Status: AC
  Administered 2023-08-04: 4 g via INTRAVENOUS
  Filled 2023-08-04: qty 100

## 2023-08-04 MED ORDER — ACETAMINOPHEN 325 MG PO TABS
650.0000 mg | ORAL_TABLET | Freq: Four times a day (QID) | ORAL | Status: DC | PRN
  Administered 2023-08-04: 650 mg via ORAL
  Filled 2023-08-04: qty 2

## 2023-08-04 MED ORDER — CALCIUM GLUCONATE-NACL 1-0.675 GM/50ML-% IV SOLN
1.0000 g | Freq: Once | INTRAVENOUS | Status: AC
  Administered 2023-08-04: 1000 mg via INTRAVENOUS
  Filled 2023-08-04: qty 50

## 2023-08-04 MED ORDER — CARVEDILOL 6.25 MG PO TABS
6.2500 mg | ORAL_TABLET | Freq: Two times a day (BID) | ORAL | Status: DC
  Administered 2023-08-05 – 2023-08-09 (×9): 6.25 mg via ORAL
  Filled 2023-08-04 (×9): qty 1

## 2023-08-04 MED ORDER — ACETAMINOPHEN 650 MG RE SUPP: 650.0000 mg | Freq: Four times a day (QID) | RECTAL | Status: DC | PRN

## 2023-08-04 MED ORDER — INSULIN ASPART 100 UNIT/ML IJ SOLN
0.0000 [IU] | Freq: Three times a day (TID) | INTRAMUSCULAR | Status: DC
  Administered 2023-08-06 – 2023-08-09 (×6): 1 [IU] via SUBCUTANEOUS

## 2023-08-04 MED ORDER — SODIUM CHLORIDE 0.9 % IV SOLN: INTRAVENOUS | Status: DC

## 2023-08-04 MED ORDER — MORPHINE SULFATE (PF) 2 MG/ML IV SOLN: 2.0000 mg | INTRAVENOUS | Status: DC | PRN

## 2023-08-04 MED ORDER — DOXYCYCLINE HYCLATE 100 MG PO TABS
100.0000 mg | ORAL_TABLET | Freq: Once | ORAL | Status: AC
  Administered 2023-08-04: 100 mg via ORAL
  Filled 2023-08-04: qty 1

## 2023-08-04 MED ORDER — SODIUM CHLORIDE 0.9 % IV SOLN
2.0000 g | Freq: Once | INTRAVENOUS | Status: AC
  Administered 2023-08-04: 2 g via INTRAVENOUS
  Filled 2023-08-04: qty 20

## 2023-08-04 MED ORDER — PANTOPRAZOLE SODIUM 40 MG PO TBEC
40.0000 mg | DELAYED_RELEASE_TABLET | Freq: Every day | ORAL | Status: DC
  Administered 2023-08-05 – 2023-08-09 (×5): 40 mg via ORAL
  Filled 2023-08-04 (×6): qty 1

## 2023-08-04 MED ORDER — POTASSIUM CHLORIDE CRYS ER 20 MEQ PO TBCR
40.0000 meq | EXTENDED_RELEASE_TABLET | Freq: Once | ORAL | Status: AC
  Administered 2023-08-04: 40 meq via ORAL
  Filled 2023-08-04: qty 2

## 2023-08-04 MED ORDER — SODIUM CHLORIDE 0.9 % IV SOLN
2.0000 g | INTRAVENOUS | Status: DC
  Administered 2023-08-05 – 2023-08-08 (×4): 2 g via INTRAVENOUS
  Filled 2023-08-04 (×4): qty 20

## 2023-08-04 MED ORDER — METRONIDAZOLE 500 MG PO TABS: 500.0000 mg | ORAL_TABLET | Freq: Every day | ORAL | Status: DC

## 2023-08-04 MED ORDER — ENOXAPARIN SODIUM 40 MG/0.4ML IJ SOSY
40.0000 mg | PREFILLED_SYRINGE | INTRAMUSCULAR | Status: DC
  Administered 2023-08-04 – 2023-08-08 (×5): 40 mg via SUBCUTANEOUS
  Filled 2023-08-04 (×5): qty 0.4

## 2023-08-04 MED ORDER — POLYETHYLENE GLYCOL 3350 17 G PO PACK
8.5000 g | PACK | Freq: Every day | ORAL | Status: DC
  Administered 2023-08-04 – 2023-08-09 (×4): 8.5 g via ORAL
  Filled 2023-08-04 (×4): qty 1

## 2023-08-04 MED ORDER — INSULIN ASPART 100 UNIT/ML IJ SOLN
0.0000 [IU] | Freq: Every day | INTRAMUSCULAR | Status: DC
  Administered 2023-08-04: 2 [IU] via SUBCUTANEOUS

## 2023-08-04 MED ORDER — IRBESARTAN 75 MG PO TABS
75.0000 mg | ORAL_TABLET | Freq: Every day | ORAL | Status: DC
  Administered 2023-08-05 – 2023-08-09 (×5): 75 mg via ORAL
  Filled 2023-08-04 (×5): qty 1

## 2023-08-04 MED ORDER — ALBUTEROL SULFATE (2.5 MG/3ML) 0.083% IN NEBU
2.5000 mg | INHALATION_SOLUTION | Freq: Four times a day (QID) | RESPIRATORY_TRACT | Status: DC
  Filled 2023-08-04: qty 3

## 2023-08-04 MED ORDER — OXYCODONE HCL 5 MG PO TABS: 5.0000 mg | ORAL_TABLET | ORAL | Status: DC | PRN

## 2023-08-04 MED ORDER — PYRIDOSTIGMINE BROMIDE 60 MG PO TABS
60.0000 mg | ORAL_TABLET | Freq: Every day | ORAL | Status: DC
  Administered 2023-08-05 – 2023-08-09 (×5): 60 mg via ORAL
  Filled 2023-08-04 (×5): qty 1

## 2023-08-04 NOTE — ED Notes (Signed)
ED TO INPATIENT HANDOFF REPORT  ED Nurse Name and Phone #: Corrie Dandy and Joice Lofts, RN  S Name/Age/Gender Andrea Brooks 87 y.o. female Room/Bed: 046C/046C  Code Status   Code Status: Full Code  Home/SNF/Other Home Patient oriented to: self, place, time, and situation Is this baseline? Yes   Triage Complete: Triage complete  Chief Complaint Hypokalemia [E87.6]  Triage Note Pt/daughter stated, she has had numbness and tingling in her hands for a week. She has been on Flaygl for a year and they said if she has any of the symptoms to stop the Flaygl and call us. She has bubbles on or outside of her small intestines. Pneumatosis CYSTOIDES. This all started from a bad meal. She is also having little bouts of sweating. She goes to [facility]. Goes to [facility] for the intestines issue. Daughter stated, she has had some jerking and twitching which is out of the normal   Allergies Allergies  Allergen Reactions   Tape Other (See Comments)    Skin tears easily.  OK with paper tape.   Flagyl [Metronidazole] Other (See Comments)    Neuropathy with 250mg  BID dosing.  OK to take 250mg  QD    Level of Care/Admitting Diagnosis ED Disposition     ED Disposition  Admit   Condition  --   Comment  Hospital Area: [facility] [[id number]]  Level of Care: Telemetry Medical [104]  May place patient in observation at [facility] or [facility] if equivalent level of care is available:: Yes  Covid Evaluation: Asymptomatic - no recent exposure (last 10 days) testing not required  Diagnosis: Hypokalemia [172180]  Admitting Physician: Lorin Glass [[id number]]  Attending Physician: Lorin Glass [[id number]]          B Medical/Surgery History Past Medical History:  Diagnosis Date   Anxiety    CHF (congestive heart failure) (HCC)    Colon polyps    Diabetes mellitus without complication (HCC)    Diverticulosis    Food poisoning    Gallstones    GERD  (gastroesophageal reflux disease)    Hyperlipidemia    Hypertension    Obesity    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   Left forearm and Left forearm superior   Past Surgical History:  Procedure Laterality Date   ABDOMINAL HYSTERECTOMY     total   BREAST BIOPSY Left    neg   CHOLECYSTECTOMY     JOINT REPLACEMENT Bilateral    knee   LAPAROTOMY N/A 03/11/2021   Procedure: EXPLORATORY LAPAROTOMY;  Surgeon: Berna Bue, MD;  Location: [facility] OR;  Service: General;  Laterality: N/A;   LEFT HEART CATH AND CORONARY ANGIOGRAPHY N/A 07/04/2018   Procedure: LEFT HEART CATH AND CORONARY ANGIOGRAPHY;  Surgeon: Corky Crafts, MD;  Location: [facility] INVASIVE CV LAB;  Service: Cardiovascular;  Laterality: N/A;   REPLACEMENT TOTAL KNEE BILATERAL       A IV Location/Drains/Wounds Patient Lines/Drains/Airways Status     Active Line/Drains/Airways     Name Placement date Placement time Site Days   Peripheral IV 08/04/23 20 G Left Antecubital 08/04/23  1301  Antecubital  less than 1   Peripheral IV 08/04/23 20 G Right Antecubital 08/04/23  1528  Antecubital  less than 1            Intake/Output Last 24 hours No intake or output data in the 24 hours ending 08/04/23 1530  Labs/Imaging Results for orders placed or performed  during the hospital encounter of 08/04/23 (from the past 48 hour(s))  Urinalysis, Routine w reflex microscopic -Urine, Clean Catch     Status: Abnormal   Collection Time: 08/04/23 11:44 AM  Result Value Ref Range   Color, Urine AMBER (A) YELLOW    Comment: BIOCHEMICALS MAY BE AFFECTED BY COLOR   APPearance CLOUDY (A) CLEAR   Specific Gravity, Urine 1.026 1.005 - 1.030   pH 5.0 5.0 - 8.0   Glucose, UA NEGATIVE NEGATIVE mg/dL   Hgb urine dipstick NEGATIVE NEGATIVE   Bilirubin Urine NEGATIVE NEGATIVE   Ketones, ur 20 (A) NEGATIVE mg/dL   Protein, ur 30 (A) NEGATIVE mg/dL   Nitrite NEGATIVE NEGATIVE   Leukocytes,Ua TRACE (A) NEGATIVE   RBC / HPF  11-20 0 - 5 RBC/hpf   WBC, UA 6-10 0 - 5 WBC/hpf   Bacteria, UA RARE (A) NONE SEEN   Squamous Epithelial / HPF 6-10 0 - 5 /HPF   Mucus PRESENT    Hyaline Casts, UA PRESENT     Comment: Performed at [facility], [address] [place], [place] [address]  Basic metabolic panel     Status: Abnormal   Collection Time: 08/04/23 11:48 AM  Result Value Ref Range   Sodium 140 135 - 145 mmol/L   Potassium 3.0 (L) 3.5 - 5.1 mmol/L   Chloride 101 98 - 111 mmol/L   CO2 25 22 - 32 mmol/L   Glucose, Bld 183 (H) 70 - 99 mg/dL    Comment: Glucose reference range applies only to samples taken after fasting for at least 8 hours.   BUN 17 8 - 23 mg/dL   Creatinine, Ser 8.29 (H) 0.44 - 1.00 mg/dL   Calcium 5.9 (LL) 8.9 - 10.3 mg/dL    Comment: CRITICAL RESULT CALLED TO, READ BACK BY AND VERIFIED WITH Earleen Reaper, RN @ 1316 08/04/23 BY SEKDAHL   GFR, Estimated 50 (L) >60 mL/min    Comment: (NOTE) Calculated using the CKD-EPI Creatinine Equation (2021)    Anion gap 14 5 - 15    Comment: Performed at [facility], [address] [place], [place] [address]  CBC     Status: Abnormal   Collection Time: 08/04/23 11:48 AM  Result Value Ref Range   WBC 25.1 (H) 4.0 - 10.5 K/uL   RBC 4.52 3.87 - 5.11 MIL/uL   Hemoglobin 13.5 12.0 - 15.0 g/dL   HCT 08.6 57.8 - 46.9 %   MCV 92.7 80.0 - 100.0 fL   MCH 29.9 26.0 - 34.0 pg   MCHC 32.2 30.0 - 36.0 g/dL   RDW 62.9 52.8 - 41.3 %   Platelets 256 150 - 400 K/uL   nRBC 0.0 0.0 - 0.2 %    Comment: Performed at [facility], [address], [place], [place] [address]  Troponin I (High Sensitivity)     Status: Abnormal   Collection Time: 08/04/23  1:54 PM  Result Value Ref Range   Troponin I (High Sensitivity) 93 (H) <18 ng/L    Comment: (NOTE) Elevated high sensitivity troponin I (hsTnI) values and significant  changes across serial measurements may suggest ACS but many other  chronic and acute conditions are known to elevate hsTnI results.   Refer to the "Links" section for chest pain algorithms and additional  guidance. Performed at [facility], [address] [place], [place] [address]   Hepatic function panel     Status: Abnormal  Collection Time: 08/04/23  1:54 PM  Result Value Ref Range   Total Protein 6.9 6.5 - 8.1 g/dL   Albumin 2.6 (L) 3.5 - 5.0 g/dL   AST 21 15 - 41 U/L   ALT 12 0 - 44 U/L   Alkaline Phosphatase 50 38 - 126 U/L   Total Bilirubin 0.7 0.3 - 1.2 mg/dL   Bilirubin, Direct 0.2 0.0 - 0.2 mg/dL   Indirect Bilirubin 0.5 0.3 - 0.9 mg/dL    Comment: Performed at [facility], [address] [place], [place] [address]   DG Chest 2 View  Result Date: 08/04/2023 CLINICAL DATA:  Chest pain. EXAM: CHEST - 2 VIEW COMPARISON:  July 22, 2023. FINDINGS: Stable cardiomediastinal silhouette. Hypoinflation of the lungs is noted. Left lung is clear. Mild right basilar atelectasis or infiltrate is noted with small right pleural effusion. Bony thorax is unremarkable. IMPRESSION: Hypoinflation of the lungs. Mild right basilar atelectasis or infiltrate with small right pleural effusion. Electronically Signed   By: Lupita Raider M.D.   On: 08/04/2023 12:48    Pending Labs Unresulted Labs (From admission, onward)     Start     Ordered   08/05/23 0500  Basic metabolic panel  Tomorrow morning,   R        08/04/23 1512   08/05/23 0500  CBC  Tomorrow morning,   R        08/04/23 1512   08/05/23 0500  Magnesium  Tomorrow morning,   R        08/04/23 1524   08/05/23 0500  Phosphorus  Tomorrow morning,   R        08/04/23 1524   08/04/23 1521  Procalcitonin  Add-on,   AD       References:    Procalcitonin Lower Respiratory Tract Infection AND Sepsis Procalcitonin Algorithm   08/04/23 1520   08/04/23 1511  Hemoglobin A1c  Add-on,   AD       Comments: To assess prior glycemic control    08/04/23 1512   08/04/23 1455  Blood culture (routine x 2)  BLOOD CULTURE X 2,   R      08/04/23 1455   08/04/23  1318  Calcium, ionized  Once,   STAT        08/04/23 1317   08/04/23 1143  SARS Coronavirus 2 by RT PCR (hospital order, performed in [facility] hospital lab) *cepheid single result test* Anterior Nasal Swab  Once,   URGENT        08/04/23 1142            Vitals/Pain Today's Vitals   08/04/23 1137 08/04/23 1415 08/04/23 1430 08/04/23 1455  BP:  129/61 139/75   Pulse:  73 77   Resp:  (!) 21 (!) 26   Temp:    98.4 F (36.9 C)  TempSrc:    Oral  SpO2:  97% 97%   PainSc: 0-No pain       Isolation Precautions No active isolations  Medications Medications  potassium chloride SA (KLOR-CON M) CR tablet 40 mEq (has no administration in time range)  cefTRIAXone (ROCEPHIN) 2 g in sodium chloride 0.9 % 100 mL IVPB (has no administration in time range)  doxycycline (VIBRA-TABS) tablet 100 mg (has no administration in time range)  calcium gluconate 1 g/ 50 mL sodium chloride IVPB (has no administration in time range)  insulin aspart (novoLOG) injection 0-9 Units (has no administration in time range)  insulin aspart (novoLOG) injection 0-5 Units (has no administration in time range)  acetaminophen (TYLENOL) tablet 650 mg (has no administration in time range)    Or  acetaminophen (TYLENOL) suppository 650 mg (has no administration in time range)  albuterol (PROVENTIL) (2.5 MG/3ML) 0.083% nebulizer solution 2.5 mg (has no administration in time range)  hydrALAZINE (APRESOLINE) injection 10 mg (has no administration in time range)  enoxaparin (LOVENOX) injection 40 mg (has no administration in time range)  0.9 %  sodium chloride infusion (has no administration in time range)  oxyCODONE (Oxy IR/ROXICODONE) immediate release tablet 5 mg (has no administration in time range)  morphine (PF) 2 MG/ML injection 2 mg (has no administration in time range)  calcium gluconate 1 g/ 50 mL sodium chloride IVPB (0 mg Intravenous Stopped 08/04/23 1529)    Mobility walks with person assist      Focused Assessments Neuro Assessment Handoff:  Swallow screen pass? Yes    NIH Stroke Scale  Dizziness Present: No Headache Present: No Interval: Initial Level of Consciousness (1a.)   : Alert, keenly responsive LOC Questions (1b. )   : Answers both questions correctly LOC Commands (1c. )   : Performs both tasks correctly Best Gaze (2. )  : Normal Visual (3. )  : No visual loss Facial Palsy (4. )    : Normal symmetrical movements Motor Arm, Left (5a. )   : No drift Motor Arm, Right (5b. ) : No drift Motor Leg, Left (6a. )  : No drift Motor Leg, Right (6b. ) : No drift Limb Ataxia (7. ): Absent Sensory (8. )  : Normal, no sensory loss Best Language (9. )  : No aphasia Dysarthria (10. ): Normal Extinction/Inattention (11.)   : No Abnormality Complete NIHSS TOTAL: 0     Neuro Assessment: Exceptions to WDL Neuro Checks:   Initial (08/04/23 1240)  Has TPA been given? No If patient is a Neuro Trauma and patient is going to OR before floor call report to 4N Charge nurse: [phone] or [phone]   R Recommendations: See Admitting Provider Note  Report given to:   Additional Notes: pt is on room air.

## 2023-08-04 NOTE — ED Notes (Signed)
PT assisted to bedside commode

## 2023-08-04 NOTE — ED Triage Notes (Addendum)
Pt/daughter stated, she has had numbness and tingling in her hands for a week. She has been on Flaygl for a year and they said if she has any of the symptoms to stop the Flaygl and call us. She has bubbles on or outside of her small intestines. Pneumatosis CYSTOIDES. This all started from a bad meal. She is also having little bouts of sweating. She goes to [facility]. Goes to [facility] for the intestines issue. Daughter stated, she has had some jerking and twitching which is out of the normal

## 2023-08-04 NOTE — ED Notes (Signed)
Attending provider at bedside

## 2023-08-04 NOTE — ED Provider Notes (Addendum)
EMERGENCY DEPARTMENT AT [facility] Provider Note   CSN: [id number] Arrival date & time: 08/04/23  1122     History  Chief Complaint  Patient presents with   Numbness   Chest Pain   Tingling    Andrea Brooks is a 87 y.o. female.  87 year old female with a history of SIBO on Flagyl, anxiety, CHF, DM, HTN, and HLD who presents to the emergency department with numbness of her arms and legs, generalized weakness, right-sided chest pain.  Reports that 2 weeks ago she was started on Flagyl twice daily instead of once daily for her SIBO.  Shortly afterwards started having numbness and tingling in her feet that also progressed to her hands.  Says that she is feeling generally weak since then.  Over the last 2 nights has been having spasms when she sleeps.  Urine has turned orange.  Has been on the Flagyl for 2 years at this point.  Family is concerned that she may be having side effects due to Flagyl.  Also has been having right-sided sharp and pleuritic chest pain without a cough.  Was seen in the emergency department on 8/29 for this and had a CTA that did not show any acute findings.  Has been having hot flashes but no objective fevers.  Family also feels that she has been somewhat confused recently.       Home Medications Prior to Admission medications   Medication Sig Start Date End Date Taking? Authorizing Provider  acetaminophen (TYLENOL) 500 MG tablet Take 1,000 mg by mouth 2 (two) times daily as needed for moderate pain.   Yes [provider]  aspirin EC 81 MG tablet Take 81 mg by mouth daily.   Yes [provider]  carvedilol (COREG) 6.25 MG tablet TAKE 1 TABLET TWICE A DAY WITH MEALS 07/20/23  Yes Hilty, Lisette Abu, MD  Multiple Vitamins-Minerals (MULTI FOR HER 50+) TABS Take 1 tablet by mouth daily.   Yes [provider]  omeprazole (PRILOSEC) 20 MG capsule TAKE 1 CAPSULE DAILY 30 MINUTES BEFORE BREAKFAST 09/21/22  Yes Zehr, Shanda Bumps  D, PA-C  polyethylene glycol powder (GLYCOLAX/MIRALAX) 17 GM/SCOOP powder Take 8.5 g by mouth daily.   Yes [provider]  pyridostigmine (MESTINON) 60 MG tablet Take 60 mg by mouth daily. 02/03/22  Yes [provider]  simvastatin (ZOCOR) 20 MG tablet Take 1 tablet (20 mg total) by mouth at bedtime. Patient taking differently: Take 20 mg by mouth daily. 10/05/22 09/30/23 Yes Hilty, Lisette Abu, MD  valsartan (DIOVAN) 320 MG tablet Take 1 tablet (320 mg total) by mouth daily. 05/18/23  Yes Saralyn Pilar A, PA  metroNIDAZOLE (FLAGYL) 250 MG tablet Take 1 tablet (250 mg total) by mouth 3 (three) times daily. Patient not taking: Reported on 08/04/2023 12/01/21   Sherrilyn Rist, MD      Allergies    Tape and Flagyl [metronidazole]    Review of Systems   Review of Systems  Physical Exam Updated Vital Signs BP (!) 147/73   Pulse 87   Temp 98.6 F (37 C) (Oral)   Resp (!) 21   SpO2 94%  Physical Exam Vitals and nursing note reviewed.  Constitutional:      General: She is not in acute distress.    Appearance: She is well-developed.  HENT:     Head: Normocephalic and atraumatic.     Right Ear: External ear normal.     Left Ear: External ear normal.  Nose: Nose normal.  Eyes:     Extraocular Movements: Extraocular movements intact.     Conjunctiva/sclera: Conjunctivae normal.     Pupils: Pupils are equal, round, and reactive to light.  Cardiovascular:     Rate and Rhythm: Normal rate and regular rhythm.     Heart sounds: No murmur heard. Pulmonary:     Effort: Pulmonary effort is normal. No respiratory distress.     Breath sounds: Normal breath sounds.  Abdominal:     General: Abdomen is flat. There is no distension.     Palpations: Abdomen is soft. There is no mass.     Tenderness: There is no abdominal tenderness. There is no guarding.  Musculoskeletal:     Cervical back: Normal range of motion and neck supple.     Right lower leg: No edema.     Left  lower leg: No edema.  Skin:    General: Skin is warm and dry.  Neurological:     Mental Status: She is alert and oriented to person, place, and time. Mental status is at baseline.     Cranial Nerves: No cranial nerve deficit.     Sensory: No sensory deficit.     Motor: No weakness.     Comments: MENTAL STATUS: AAOx3 CRANIAL NERVES: II: Pupils equal and reactive 4 mm BL III, IV, VI: EOM intact, no gaze preference or deviation, no nystagmus. V: normal sensation to light touch in V1, V2, and V3 segments bilaterally VII: no facial weakness or asymmetry, no nasolabial fold flattening VIII: normal hearing to speech and finger friction IX, X: normal palatal elevation, no uvular deviation XI: 5/5 head turn and 5/5 shoulder shrug bilaterally XII: midline tongue protrusion MOTOR: 5/5 strength in R shoulder flexion, elbow flexion and extension, and grip strength. 5/5 strength in L shoulder flexion, elbow flexion and extension, and grip strength.  5/5 strength in R hip and knee flexion, knee extension, ankle plantar and dorsiflexion. 5/5 strength in L hip and knee flexion, knee extension, ankle plantar and dorsiflexion. SENSORY: Normal sensation to light touch in all extremities  Psychiatric:        Mood and Affect: Mood normal.     ED Results / Procedures / Treatments   Labs (all labs ordered are listed, but only abnormal results are displayed) Labs Reviewed  BASIC METABOLIC PANEL - Abnormal; Notable for the following components:      Result Value   Potassium 3.0 (*)    Glucose, Bld 183 (*)    Creatinine, Ser 1.09 (*)    Calcium 5.9 (*)    GFR, Estimated 50 (*)    All other components within normal limits  CBC - Abnormal; Notable for the following components:   WBC 25.1 (*)    All other components within normal limits  URINALYSIS, ROUTINE W REFLEX MICROSCOPIC - Abnormal; Notable for the following components:   Color, Urine AMBER (*)    APPearance CLOUDY (*)    Ketones, ur 20 (*)     Protein, ur 30 (*)    Leukocytes,Ua TRACE (*)    Bacteria, UA RARE (*)    All other components within normal limits  HEPATIC FUNCTION PANEL - Abnormal; Notable for the following components:   Albumin 2.6 (*)    All other components within normal limits  TROPONIN I (HIGH SENSITIVITY) - Abnormal; Notable for the following components:   Troponin I (High Sensitivity) 93 (*)    All other components within normal limits  TROPONIN I (  HIGH SENSITIVITY) - Abnormal; Notable for the following components:   Troponin I (High Sensitivity) 96 (*)    All other components within normal limits  SARS CORONAVIRUS 2 BY RT PCR  CULTURE, BLOOD (ROUTINE X 2)  CULTURE, BLOOD (ROUTINE X 2)  URINE CULTURE  PROCALCITONIN  CALCIUM, IONIZED  HEMOGLOBIN A1C  BASIC METABOLIC PANEL  CBC  MAGNESIUM  PHOSPHORUS  CBG MONITORING, ED    EKG EKG Interpretation Date/Time:  Wednesday August 04 2023 12:44:18 EDT Ventricular Rate:  95 PR Interval:  236 QRS Duration:  107 QT Interval:  427 QTC Calculation: 537 R Axis:   -63  Text Interpretation: Sinus or ectopic atrial rhythm Multiple premature complexes, vent & supraven Prolonged PR interval Left ventricular hypertrophy Inferior infarct, old Probable anterior infarct, age indeterminate Prolonged QT interval Confirmed by Vonita Moss [phone]) on 08/04/2023 1:07:42 PM  Radiology DG Chest 2 View  Result Date: 08/04/2023 CLINICAL DATA:  Chest pain. EXAM: CHEST - 2 VIEW COMPARISON:  July 22, 2023. FINDINGS: Stable cardiomediastinal silhouette. Hypoinflation of the lungs is noted. Left lung is clear. Mild right basilar atelectasis or infiltrate is noted with small right pleural effusion. Bony thorax is unremarkable. IMPRESSION: Hypoinflation of the lungs. Mild right basilar atelectasis or infiltrate with small right pleural effusion. Electronically Signed   By: Lupita Raider M.D.   On: 08/04/2023 12:48    Procedures Procedures    Medications Ordered in  ED Medications  insulin aspart (novoLOG) injection 0-9 Units ( Subcutaneous Not Given 08/04/23 1709)  insulin aspart (novoLOG) injection 0-5 Units (has no administration in time range)  acetaminophen (TYLENOL) tablet 650 mg (has no administration in time range)    Or  acetaminophen (TYLENOL) suppository 650 mg (has no administration in time range)  hydrALAZINE (APRESOLINE) injection 10 mg (has no administration in time range)  enoxaparin (LOVENOX) injection 40 mg (has no administration in time range)  0.9 %  sodium chloride infusion ( Intravenous New Bag/Given 08/04/23 1551)  oxyCODONE (Oxy IR/ROXICODONE) immediate release tablet 5 mg (has no administration in time range)  morphine (PF) 2 MG/ML injection 2 mg (has no administration in time range)  albuterol (PROVENTIL) (2.5 MG/3ML) 0.083% nebulizer solution 2.5 mg (has no administration in time range)  magnesium sulfate IVPB 4 g 100 mL (4 g Intravenous New Bag/Given 08/04/23 1649)  cefTRIAXone (ROCEPHIN) 2 g in sodium chloride 0.9 % 100 mL IVPB (has no administration in time range)  aspirin EC tablet 81 mg (has no administration in time range)  carvedilol (COREG) tablet 6.25 mg (has no administration in time range)  pantoprazole (PROTONIX) EC tablet 40 mg (has no administration in time range)  pyridostigmine (MESTINON) tablet 60 mg (has no administration in time range)  polyethylene glycol (MIRALAX / GLYCOLAX) packet 8.5 g (has no administration in time range)  simvastatin (ZOCOR) tablet 20 mg (has no administration in time range)  irbesartan (AVAPRO) tablet 75 mg (has no administration in time range)  calcium gluconate 1 g/ 50 mL sodium chloride IVPB (0 mg Intravenous Stopped 08/04/23 1529)  potassium chloride SA (KLOR-CON M) CR tablet 40 mEq (40 mEq Oral Given 08/04/23 1555)  cefTRIAXone (ROCEPHIN) 2 g in sodium chloride 0.9 % 100 mL IVPB (0 g Intravenous Stopped 08/04/23 1624)  doxycycline (VIBRA-TABS) tablet 100 mg (100 mg Oral Given 08/04/23  1555)  calcium gluconate 1 g/ 50 mL sodium chloride IVPB (0 mg Intravenous Stopped 08/04/23 1656)    ED Course/ Medical Decision Making/ A&P Clinical Course as of  08/04/23 1819  Wed Aug 04, 2023  1306 DG Chest 2 View IMPRESSION: Hypoinflation of the lungs. Mild right basilar atelectasis or infiltrate with small right pleural effusion. [RP]  1312 Amanda collier from GI recommends change the patient back to daily Flagyl and following up as an outpatient [RP]  1505 Dr Pola Corn from hospitalist to admit [RP]    Clinical Course User Index [RP] Rondel Baton, MD                                 Medical Decision Making Amount and/or Complexity of Data Reviewed Labs: ordered. Radiology: ordered. Decision-making details documented in ED Course.  Risk Prescription drug management. Decision regarding hospitalization.   Andrea Brooks is a 87 y.o. female with comorbidities that complicate the patient evaluation including SIBO on Flagyl, anxiety, CHF, DM, HTN, and HLD who presents to the emergency department with numbness of her arms and legs, generalized weakness, right-sided chest pain.   Initial Ddx:  Electrolyte abnormality, Flagyl side effect, Guillain-Barr/demyelinating disease, polyneuropathy, MI, PE, pneumonia  MDM/Course:  Patient presents to the emergency department with numbness and tingling of her arms and legs as well as generalized weakness.  Does not have any focal weakness on her exam.  Family is concerned that this may be a side effect of Flagyl and while there is some case reports of this note was able to find I did discuss it with gastroenterology who felt that it was less likely Flagyl and may be related to other contributing factors.  They recommended that she decrease her Flagyl to once daily from the twice daily she is taking.  Was eventually found to have critically low calcium as well as low potassium.  Magnesium was sent and is pending at the time of admission.  Upon  re-evaluation the patient mains stable.  In terms of her chest pain, has already had a workup for this which included a CTA making pulmonary embolism less likely.  Today was found to have right-sided infiltrate with pleural effusions that I suspect that it may be due to a pneumonia.  Had a troponin that was elevated but stable.  Was admitted to medicine for further management of her electrolyte abnormalities, pneumonia, and chest discomfort.  This patient presents to the ED for concern of complaints listed in HPI, this involves an extensive number of treatment options, and is a complaint that carries with it a high risk of complications and morbidity. Disposition including potential need for admission considered.   Dispo: Admit to Floor  Additional history obtained from daughter Records reviewed Outpatient Clinic Notes The following labs were independently interpreted: Chemistry and show  hypocalcemia I independently reviewed the following imaging with scope of interpretation limited to determining acute life threatening conditions related to emergency care: Chest x-ray and agree with the radiologist interpretation with the following exceptions: none I personally reviewed and interpreted cardiac monitoring: normal sinus rhythm  I personally reviewed and interpreted the pt's EKG: see above for interpretation  I have reviewed the patients home medications and made adjustments as needed Consults: Gastroenterology and Hospitalist Social Determinants of health:  Elderly  CRITICAL CARE Performed by: Rondel Baton   Total critical care time: 30 minutes  Critical care time was exclusive of separately billable procedures and treating other patients.  Critical care was necessary to treat or prevent imminent or life-threatening deterioration.  Critical care was time spent personally by me on  the following activities: development of treatment plan with patient and/or surrogate as well as nursing,  discussions with consultants, evaluation of patient's response to treatment, examination of patient, obtaining history from patient or surrogate, ordering and performing treatments and interventions, ordering and review of laboratory studies, ordering and review of radiographic studies, pulse oximetry and re-evaluation of patient's condition.  Final Clinical Impression(s) / ED Diagnoses Final diagnoses:  Paresthesia  Hypokalemia  Hypocalcemia  Pneumonia of right lower lobe due to infectious organism    Rx / DC Orders ED Discharge Orders     None         Rondel Baton, MD 08/04/23 1819    Rondel Baton, MD 08/08/23 [phone]

## 2023-08-04 NOTE — ED Notes (Signed)
ED TO INPATIENT HANDOFF REPORT  ED Nurse Name and Phone #: Jessye Imhoff [phone]  S Name/Age/Gender Andrea Brooks 87 y.o. female Room/Bed: 035C/035C  Code Status   Code Status: Prior  Home/SNF/Other Home Patient oriented to: self, place, and situation Is this baseline? No   Triage Complete: Triage complete  Chief Complaint CP, Slurred speach, hand numbness x1 week  Triage Note Pt/daughter stated, she has had numbness and tingling in her hands for a week. She has been on Flaygl for a year and they said if she has any of the symptoms to stop the Flaygl and call us. She has bubbles on or outside of her small intestines. Pneumatosis CYSTOIDES. This all started from a bad meal. She is also having little bouts of sweating. She goes to [facility] Goes to [facility] for the intestines issue. Daughter stated, she has had some jerking and twitching which is out of the normal   Allergies Allergies  Allergen Reactions   Tape Rash and Other (See Comments)    PAPER TAPE ONLY!!!! NO CLEAR, PLASTIC TAPE- TEARS THE SKIN!!    Level of Care/Admitting Diagnosis ED Disposition     ED Disposition  Admit   Condition  --   Comment  The patient appears reasonably stabilized for admission considering the current resources, flow, and capabilities available in the ED at this time, and I doubt any other [facility] requiring further screening and/or treatment in the ED prior to admission is  present.          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   Left forearm and Left forearm superior   Past Surgical History:  Procedure Laterality Date    ABDOMINAL HYSTERECTOMY     total   BREAST BIOPSY Left    neg   CHOLECYSTECTOMY     JOINT REPLACEMENT Bilateral    knee   LAPAROTOMY N/A 03/11/2021   Procedure: EXPLORATORY LAPAROTOMY;  Surgeon: Berna Bue, MD;  Location: [facility] OR;  Service: General;  Laterality: N/A;   LEFT HEART CATH AND CORONARY ANGIOGRAPHY N/A 07/04/2018   Procedure: LEFT HEART CATH AND CORONARY ANGIOGRAPHY;  Surgeon: Corky Crafts, MD;  Location: [facility] INVASIVE CV LAB;  Service: Cardiovascular;  Laterality: N/A;   REPLACEMENT TOTAL KNEE BILATERAL       A IV Location/Drains/Wounds Patient Lines/Drains/Airways Status     Active Line/Drains/Airways     Name Placement date Placement time Site Days   Peripheral IV 08/04/23 20 G Left Antecubital 08/04/23  1301  Antecubital  less than 1            Intake/Output Last 24 hours No intake or output data in the 24 hours ending 08/04/23 1405  Labs/Imaging Results for orders placed or performed during the hospital encounter of 08/04/23 (from the past 48 hour(s))  Urinalysis, Routine w reflex microscopic -Urine, Clean Catch     Status: Abnormal   Collection Time: 08/04/23 11:44 AM  Result Value Ref Range   Color, Urine AMBER (A) YELLOW    Comment: BIOCHEMICALS MAY BE AFFECTED BY COLOR  APPearance CLOUDY (A) CLEAR   Specific Gravity, Urine 1.026 1.005 - 1.030   pH 5.0 5.0 - 8.0   Glucose, UA NEGATIVE NEGATIVE mg/dL   Hgb urine dipstick NEGATIVE NEGATIVE   Bilirubin Urine NEGATIVE NEGATIVE   Ketones, ur 20 (A) NEGATIVE mg/dL   Protein, ur 30 (A) NEGATIVE mg/dL   Nitrite NEGATIVE NEGATIVE   Leukocytes,Ua TRACE (A) NEGATIVE   RBC / HPF 11-20 0 - 5 RBC/hpf   WBC, UA 6-10 0 - 5 WBC/hpf   Bacteria, UA RARE (A) NONE SEEN   Squamous Epithelial / HPF 6-10 0 - 5 /HPF   Mucus PRESENT    Hyaline Casts, UA PRESENT     Comment: Performed at [facility], [address] [place], [place] [address]  Basic metabolic panel     Status: Abnormal   Collection  Time: 08/04/23 11:48 AM  Result Value Ref Range   Sodium 140 135 - 145 mmol/L   Potassium 3.0 (L) 3.5 - 5.1 mmol/L   Chloride 101 98 - 111 mmol/L   CO2 25 22 - 32 mmol/L   Glucose, Bld 183 (H) 70 - 99 mg/dL    Comment: Glucose reference range applies only to samples taken after fasting for at least 8 hours.   BUN 17 8 - 23 mg/dL   Creatinine, Ser 6.04 (H) 0.44 - 1.00 mg/dL   Calcium 5.9 (LL) 8.9 - 10.3 mg/dL    Comment: CRITICAL RESULT CALLED TO, READ BACK BY AND VERIFIED WITH Earleen Reaper, RN @ 1316 08/04/23 BY SEKDAHL   GFR, Estimated 50 (L) >60 mL/min    Comment: (NOTE) Calculated using the CKD-EPI Creatinine Equation (2021)    Anion gap 14 5 - 15    Comment: Performed at [facility], [address], [place], [place] [address]  CBC     Status: Abnormal   Collection Time: 08/04/23 11:48 AM  Result Value Ref Range   WBC 25.1 (H) 4.0 - 10.5 K/uL   RBC 4.52 3.87 - 5.11 MIL/uL   Hemoglobin 13.5 12.0 - 15.0 g/dL   HCT 11.9 14.7 - 82.9 %   MCV 92.7 80.0 - 100.0 fL   MCH 29.9 26.0 - 34.0 pg   MCHC 32.2 30.0 - 36.0 g/dL   RDW 56.2 13.0 - 86.5 %   Platelets 256 150 - 400 K/uL   nRBC 0.0 0.0 - 0.2 %    Comment: Performed at [facility], [address], [place], [place] [address]   DG Chest 2 View  Result Date: 08/04/2023 CLINICAL DATA:  Chest pain. EXAM: CHEST - 2 VIEW COMPARISON:  July 22, 2023. FINDINGS: Stable cardiomediastinal silhouette. Hypoinflation of the lungs is noted. Left lung is clear. Mild right basilar atelectasis or infiltrate is noted with small right pleural effusion. Bony thorax is unremarkable. IMPRESSION: Hypoinflation of the lungs. Mild right basilar atelectasis or infiltrate with small right pleural effusion. Electronically Signed   By: Lupita Raider M.D.   On: 08/04/2023 12:48    Pending Labs Unresulted Labs (From admission, onward)     Start     Ordered   08/04/23 1318  Calcium, ionized  Once,   STAT        08/04/23 1317   08/04/23 1307   Hepatic function panel  Once,   URGENT        08/04/23 1306   08/04/23 1143  SARS Coronavirus 2 by RT PCR (hospital order, performed in [facility]  hospital lab) *cepheid single result test* Anterior Nasal Swab  Once,   URGENT        08/04/23 1142            Vitals/Pain Today's Vitals   08/04/23 1129 08/04/23 1137  BP: 135/70   Pulse: 76   Resp: 16   Temp: 98 F (36.7 C)   SpO2: 95%   PainSc:  0-No pain    Isolation Precautions No active isolations  Medications Medications  calcium gluconate 1 g/ 50 mL sodium chloride IVPB (has no administration in time range)    Mobility walks     Focused Assessments    R Recommendations: See Admitting Provider Note  Report given to:   Additional Notes:

## 2023-08-04 NOTE — H&P (Addendum)
[facility] History and Physical  Andrea Brooks OHY:[id number] DOB: 03-16-1936 DOA: 08/04/2023 PCP: Melida Quitter, PA  Admitted from: Home Chief Complaint: Tingling, numbness of extremities  History of Present Illness: Andrea Brooks is a 87 y.o. female with PMH significant for DM2, HTN, HLD, CHF, GERD, anxiety, Per family, patient has a rare condition called pneumatosis cystoides in which she has some 'blisters' in the intestinal wall that gives her intermittent gastroenteritis symptoms.  She follows up at [facility] and is on pyridostigmine bromide daily which slows down her bowel.  To prevent bacterial overgrowth from slow bowel, patient has been taking Flagyl 500 mg daily.  Because of her GI symptoms, her appetite has been poor and she has lost significant weight. She last saw her physicians at [facility] 2 weeks ago.  At that time she mentioned intermittent flareup of GI symptoms.  Flagyl 500 mg was increased from daily to twice daily which she took for 2 days.  Patient reports since then she started feeling tingling, numbness and lethargy.  She called her physician and was asked to reduce Flagyl back to daily dosing again.  Despite the change, patient continued to feel symptoms.  She also has developed generalized weakness.  Over the last 2 nights, patient is experiencing spasms when she sleeps.  Family was concerned that patient was having side effects due to Flagyl and brought to the ED.  In the ED, patient was afebrile, hemodynamically stable Labs showed WC count elevated to 25,000, hemoglobin, platelet normal, potassium low at 3, BUN/creatinine 17/1.09, calcium level low at 5.9 with albumin slightly low at 2.6, magnesium level significantly low at 0.6 LFTs normal, troponin elevated to 93 COVID PCR pending Urinalysis showed cloudy amber-colored urine with trace leukocytes, 20 ketones and rare bacteria Chest x-ray with mild right basilar infiltrate EKG with sinus rhythm at 95  and QTc prolonged at 527 ms  In the ED, patient was given IV fluid, IV calcium, IV potassium replacement, IV magnesium replacement Hospitalist service consulted for observation and management  At the time of my evaluation, patient was propped up in bed.  Not in distress.  Husband and daughter at bedside.  Patient states she continues to numbness and tingling of her extremities. History confirmed and reviewed as above.  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   Left forearm and Left forearm superior    Past surgical history: Past Surgical History:  Procedure Laterality Date   ABDOMINAL HYSTERECTOMY     total   BREAST BIOPSY Left    neg   CHOLECYSTECTOMY     JOINT REPLACEMENT Bilateral    knee   LAPAROTOMY N/A 03/11/2021   Procedure: EXPLORATORY LAPAROTOMY;  Surgeon: Berna Bue, MD;  Location: [facility] OR;  Service: General;  Laterality: N/A;   LEFT HEART CATH AND CORONARY ANGIOGRAPHY N/A 07/04/2018   Procedure: LEFT HEART CATH AND CORONARY ANGIOGRAPHY;  Surgeon: Corky Crafts, MD;  Location: [facility] INVASIVE CV LAB;  Service: Cardiovascular;  Laterality: N/A;   REPLACEMENT TOTAL KNEE BILATERAL      Social History:  reports that she quit smoking about 63 years ago. Her smoking use included cigarettes. She started smoking about 65 years ago. She has a 2 pack-year smoking history. She has never used smokeless tobacco. She reports that she does not drink alcohol and does not use drugs.  Allergies:  Allergies  Allergen Reactions   Tape Other (See  Comments)    Skin tears easily.  OK with paper tape.   Flagyl [Metronidazole] Other (See Comments)    Neuropathy with 250mg  BID dosing.  OK to take 250mg  QD   Tape and Flagyl [metronidazole]   Family history:  Family History  Problem Relation Age of Onset   Hypertension Mother    Colon cancer Mother    Stroke Father    Heart failure Father    Stroke Sister    Stroke Brother    Graves' disease Daughter    Breast cancer Paternal Aunt    Graves' disease Sister    Bone cancer Sister    Breast cancer Maternal Aunt    Esophageal cancer Neg Hx    Rectal cancer Neg Hx    Liver cancer Neg Hx      Physical Exam: Vitals:   08/04/23 1545 08/04/23 1630 08/04/23 1656 08/04/23 1709  BP: 132/73 (!) 154/88  (!) 147/73  Pulse: 86 63  87  Resp: (!) 21 19  (!) 21  Temp:   98.6 F (37 C)   TempSrc:   Oral   SpO2: 96% 95%  94%   Wt Readings from Last 3 Encounters:  07/22/23 62.1 kg  04/08/23 62.1 kg  03/17/23 64.4 kg   There is no height or weight on file to calculate BMI.  General exam: Pleasant, elderly Caucasian female. Skin: No rashes, lesions or ulcers. HEENT: Atraumatic, normocephalic, no obvious bleeding Lungs: Clear to auscultation bilaterally CVS: Regular rate and rhythm, no murmur GI/Abd soft, mild generalized tenderness CNS: Alert, awake, oriented x 3 Psychiatry: Anxious look Extremities: No pedal edema, no calf tenderness   ------------------------------------------------------------------------------------------------------ Assessment/Plan: Principal Problem:   Hypokalemia  Tingling and numbness of extremities Patient presented with these acute symptoms after Flagyl was increased from daily to twice daily Patient could be having peripheral neuropathy from Flagyl versus neuromuscular symptoms from low electrolytes. Monitor symptoms with electrolyte placement Currently on Flagyl 500 mg daily  Right lower lobe pneumonia Significant leukocytosis Only  respiratory complaint patient has is mild dry cough No fever.  But has significant leukocytosis of 25, elevated procalcitonin level and chest x-ray with mild right lower lobe infiltrate Blood culture sent. She has been empirically started on IV Rocephin which I will continue. Follow temperature and WBC trend.  Recent Labs  Lab 08/04/23 1148 08/04/23 1515  WBC 25.1*  --   PROCALCITON  --  0.76   Hypokalemia/hypomagnesemia Potassium level low at 3, magnesium level low at 0.6 Potassium and magnesium regimen ordered.  Repeat labs Recent Labs  Lab 08/04/23 1148  K 3.0*   Hypocalcemia Calcium level low at 5.9.  Corrected calcium level is also low.  1 dose of IV calcium blocker was ordered in the ED.  I would give 1 more dose today.  Repeat calcium level tomorrow Recent Labs    09/15/22 0902 07/22/23 1129 08/04/23 1148 08/04/23 1354  CALCIUM 9.2 6.9* 5.9*  --   ALBUMIN 3.9 2.9*  --  2.6*   Prolonged Qtc EKG with QTc prolonged at  527 ms in the setting of hypokalemia, hypocalcemia Correct electrolytes.  Repeat EKG tomorrow  Pneumatosis cystoides/ SIBO Resume Mestinon and Flagyl.  Elevated troponin No complaint of chest pain or shortness of breath. Troponin elevated 93 without EKG changes of ischemia.  Trend troponin Obtain echo. Last ischemic workup was in 2019 with nonobstructive CAD Continue aspirin and statin. Recent Labs    08/04/23 1354 08/04/23 1515  TROPONINIHS 93* 96*   Type 2 diabetes mellitus A1c-1 on April 2024 PTA meds-not on diabetes meds Start SSI/Accu-Cheks Recent Labs  Lab 08/04/23 1708  GLUCAP 97   CHF HTN PTA meds- Coreg, valsartan Resume both Continue monitor blood pressure  HLD PTA on aspirin, statin  GERD PPI  Mobility: Encourage ambulation  Goals of care   Code Status: Full Code    DVT prophylaxis:  enoxaparin (LOVENOX) injection 40 mg Start: 08/04/23 2200   Antimicrobials: IV Rocephin, Flagyl Fluid: NS at 100  mL/h Consultants: None Family Communication: Husband and daughter at bedside  Dispo: The patient is from: Home              Anticipated d/c is to: Home, pending clinical course  Diet: Diet Order             Diet full liquid Room service appropriate? Yes; Fluid consistency: Thin  Diet effective now                    ------------------------------------------------------------------------------------- Severity of Illness: The appropriate patient status for this patient is OBSERVATION. Observation status is judged to be reasonable and necessary in order to provide the required intensity of service to ensure the patient's safety. The patient's presenting symptoms, physical exam findings, and initial radiographic and laboratory data in the context of their medical condition is felt to place them at decreased risk for further clinical deterioration. Furthermore, it is anticipated that the patient will be medically stable for discharge from the hospital within 2 midnights of admission.  -------------------------------------------------------------------------------------  Home Meds: Prior to Admission medications   Medication Sig Start Date End Date Taking? Authorizing Provider  acetaminophen (TYLENOL) 500 MG tablet Take 1,000 mg by mouth 2 (two) times daily as needed for moderate pain.   Yes [provider]  aspirin EC 81 MG tablet Take 81 mg by mouth daily.   Yes [provider]  carvedilol (COREG) 6.25 MG tablet TAKE 1 TABLET TWICE A DAY WITH MEALS 07/20/23  Yes Hilty, Lisette Abu, MD  Multiple Vitamins-Minerals (MULTI FOR HER 50+) TABS Take 1 tablet by mouth daily.   Yes [provider]  omeprazole (PRILOSEC) 20 MG capsule TAKE 1 CAPSULE DAILY 30 MINUTES BEFORE BREAKFAST 09/21/22  Yes Zehr, Shanda Bumps D, PA-C  polyethylene glycol powder (GLYCOLAX/MIRALAX) 17 GM/SCOOP powder Take 8.5 g by mouth daily.   Yes [provider]  pyridostigmine (MESTINON) 60 MG  tablet Take 60 mg by mouth daily. 02/03/22  Yes [provider]  simvastatin (ZOCOR) 20 MG tablet Take 1 tablet (20 mg total) by mouth at bedtime. Patient taking differently: Take 20 mg by mouth daily. 10/05/22 09/30/23 Yes Hilty, Lisette Abu, MD  valsartan (DIOVAN) 320 MG tablet Take 1 tablet (320 mg total) by mouth daily. 05/18/23  Yes Saralyn Pilar A, PA  metroNIDAZOLE (FLAGYL) 250 MG tablet Take 1 tablet (250 mg total) by mouth 3 (three) times daily. Patient not taking: Reported on 08/04/2023 12/01/21   Sherrilyn Rist, MD    Labs on Admission:   CBC: Recent Labs  Lab 08/04/23 1148  WBC 25.1*  HGB 13.5  HCT 41.9  MCV 92.7  PLT 256    Basic Metabolic Panel: Recent Labs  Lab 08/04/23 1148  NA 140  K 3.0*  CL 101  CO2 25  GLUCOSE 183*  BUN 17  CREATININE 1.09*  CALCIUM 5.9*    Liver Function Tests: Recent Labs  Lab 08/04/23 1354  AST 21  ALT 12  ALKPHOS 50  BILITOT 0.7  PROT 6.9  ALBUMIN 2.6*   No results for input(s): "LIPASE", "AMYLASE" in the last 168 hours. No results for input(s): "AMMONIA" in the last 168 hours.  Cardiac Enzymes: No results for input(s): "CKTOTAL", "CKMB", "CKMBINDEX", "TROPONINI" in the last 168 hours.  BNP (last 3 results) No results for input(s): "BNP" in the last 8760 hours.  ProBNP (last 3 results) No results for input(s): "PROBNP" in the last 8760 hours.  CBG: Recent Labs  Lab 08/04/23 1708  GLUCAP 97    Lipase     Component Value Date/Time   LIPASE 27 03/11/2021 1846     Urinalysis    Component Value Date/Time   COLORURINE AMBER (A) 08/04/2023 1144   APPEARANCEUR CLOUDY (A) 08/04/2023 1144   LABSPEC 1.026 08/04/2023 1144   PHURINE 5.0 08/04/2023 1144   GLUCOSEU NEGATIVE 08/04/2023 1144   HGBUR NEGATIVE 08/04/2023 1144   BILIRUBINUR NEGATIVE 08/04/2023 1144   KETONESUR 20 (A) 08/04/2023 1144   PROTEINUR 30 (A) 08/04/2023 1144   NITRITE NEGATIVE 08/04/2023 1144   LEUKOCYTESUR TRACE (A) 08/04/2023  1144     Drugs of Abuse  No results found for: "LABOPIA", "COCAINSCRNUR", "LABBENZ", "AMPHETMU", "THCU", "LABBARB"    Radiological Exams on Admission: DG Chest 2 View  Result Date: 08/04/2023 CLINICAL DATA:  Chest pain. EXAM: CHEST - 2 VIEW COMPARISON:  July 22, 2023. FINDINGS: Stable cardiomediastinal silhouette. Hypoinflation of the lungs is noted. Left lung is clear. Mild right basilar atelectasis or infiltrate is noted with small right pleural effusion. Bony thorax is unremarkable. IMPRESSION: Hypoinflation of the lungs. Mild right basilar atelectasis or infiltrate with small right pleural effusion. Electronically Signed   By: Lupita Raider M.D.   On: 08/04/2023 12:48     Signed, Lorin Glass, MD [facility] 08/04/2023

## 2023-08-05 ENCOUNTER — Observation Stay (HOSPITAL_COMMUNITY): Payer: Medicare HMO

## 2023-08-05 DIAGNOSIS — Z888 Allergy status to other drugs, medicaments and biological substances status: Secondary | ICD-10-CM | POA: Diagnosis not present

## 2023-08-05 DIAGNOSIS — R9431 Abnormal electrocardiogram [ECG] [EKG]: Secondary | ICD-10-CM

## 2023-08-05 DIAGNOSIS — Z803 Family history of malignant neoplasm of breast: Secondary | ICD-10-CM | POA: Diagnosis not present

## 2023-08-05 DIAGNOSIS — K638219 Small intestinal bacterial overgrowth, unspecified: Secondary | ICD-10-CM | POA: Diagnosis present

## 2023-08-05 DIAGNOSIS — Z800 Family history of malignant neoplasm of digestive organs: Secondary | ICD-10-CM | POA: Diagnosis not present

## 2023-08-05 DIAGNOSIS — E785 Hyperlipidemia, unspecified: Secondary | ICD-10-CM | POA: Diagnosis present

## 2023-08-05 DIAGNOSIS — Z86008 Personal history of in-situ neoplasm of other site: Secondary | ICD-10-CM | POA: Diagnosis not present

## 2023-08-05 DIAGNOSIS — Z8719 Personal history of other diseases of the digestive system: Secondary | ICD-10-CM | POA: Diagnosis not present

## 2023-08-05 DIAGNOSIS — J189 Pneumonia, unspecified organism: Secondary | ICD-10-CM | POA: Diagnosis present

## 2023-08-05 DIAGNOSIS — Z90710 Acquired absence of both cervix and uterus: Secondary | ICD-10-CM | POA: Diagnosis not present

## 2023-08-05 DIAGNOSIS — I110 Hypertensive heart disease with heart failure: Secondary | ICD-10-CM | POA: Diagnosis present

## 2023-08-05 DIAGNOSIS — Z7982 Long term (current) use of aspirin: Secondary | ICD-10-CM | POA: Diagnosis not present

## 2023-08-05 DIAGNOSIS — Z79899 Other long term (current) drug therapy: Secondary | ICD-10-CM | POA: Diagnosis not present

## 2023-08-05 DIAGNOSIS — Z87891 Personal history of nicotine dependence: Secondary | ICD-10-CM | POA: Diagnosis not present

## 2023-08-05 DIAGNOSIS — Z8249 Family history of ischemic heart disease and other diseases of the circulatory system: Secondary | ICD-10-CM | POA: Diagnosis not present

## 2023-08-05 DIAGNOSIS — E440 Moderate protein-calorie malnutrition: Secondary | ICD-10-CM | POA: Diagnosis present

## 2023-08-05 DIAGNOSIS — R7989 Other specified abnormal findings of blood chemistry: Secondary | ICD-10-CM

## 2023-08-05 DIAGNOSIS — Z1152 Encounter for screening for COVID-19: Secondary | ICD-10-CM | POA: Diagnosis not present

## 2023-08-05 DIAGNOSIS — I5033 Acute on chronic diastolic (congestive) heart failure: Secondary | ICD-10-CM | POA: Diagnosis not present

## 2023-08-05 DIAGNOSIS — E119 Type 2 diabetes mellitus without complications: Secondary | ICD-10-CM | POA: Diagnosis present

## 2023-08-05 DIAGNOSIS — R079 Chest pain, unspecified: Secondary | ICD-10-CM | POA: Diagnosis present

## 2023-08-05 DIAGNOSIS — Z96653 Presence of artificial knee joint, bilateral: Secondary | ICD-10-CM | POA: Diagnosis present

## 2023-08-05 DIAGNOSIS — Z823 Family history of stroke: Secondary | ICD-10-CM | POA: Diagnosis not present

## 2023-08-05 DIAGNOSIS — K219 Gastro-esophageal reflux disease without esophagitis: Secondary | ICD-10-CM | POA: Diagnosis present

## 2023-08-05 DIAGNOSIS — E876 Hypokalemia: Secondary | ICD-10-CM | POA: Diagnosis present

## 2023-08-05 LAB — BASIC METABOLIC PANEL
Anion gap: 15 (ref 5–15)
BUN: 18 mg/dL (ref 8–23)
CO2: 20 mmol/L — ABNORMAL LOW (ref 22–32)
Calcium: 6.3 mg/dL — CL (ref 8.9–10.3)
Chloride: 103 mmol/L (ref 98–111)
Creatinine, Ser: 0.71 mg/dL (ref 0.44–1.00)
GFR, Estimated: >60 mL/min (ref >60)
Glucose, Bld: 86 mg/dL (ref 70–99)
Potassium: 4.2 mmol/L (ref 3.5–5.1)
Sodium: 138 mmol/L (ref 135–145)

## 2023-08-05 LAB — GLUCOSE, CAPILLARY
Glucose-Capillary: 82 mg/dL (ref 70–99)
Glucose-Capillary: 107 mg/dL — ABNORMAL HIGH (ref 70–99)
Glucose-Capillary: 107 mg/dL — ABNORMAL HIGH (ref 70–99)
Glucose-Capillary: 192 mg/dL — ABNORMAL HIGH (ref 70–99)

## 2023-08-05 LAB — CBC
HCT: 38.7 % (ref 36.0–46.0)
Hemoglobin: 12.5 g/dL (ref 12.0–15.0)
MCH: 29.9 pg (ref 26.0–34.0)
MCHC: 32.3 g/dL (ref 30.0–36.0)
MCV: 92.6 fL (ref 80.0–100.0)
Platelets: 135 10*3/uL — ABNORMAL LOW (ref 150–400)
RBC: 4.18 MIL/uL (ref 3.87–5.11)
RDW: 13.9 % (ref 11.5–15.5)
WBC: 13.3 10*3/uL — ABNORMAL HIGH (ref 4.0–10.5)
nRBC: 0 % (ref 0.0–0.2)

## 2023-08-05 LAB — HEPATIC FUNCTION PANEL
ALT: 11 U/L (ref 0–44)
AST: 21 U/L (ref 15–41)
Albumin: 2.3 g/dL — ABNORMAL LOW (ref 3.5–5.0)
Alkaline Phosphatase: 49 U/L (ref 38–126)
Bilirubin, Direct: 0.2 mg/dL (ref 0.0–0.2)
Indirect Bilirubin: 0.6 mg/dL (ref 0.3–0.9)
Total Bilirubin: 0.8 mg/dL (ref 0.3–1.2)
Total Protein: 6.3 g/dL — ABNORMAL LOW (ref 6.5–8.1)

## 2023-08-05 LAB — ECHOCARDIOGRAM COMPLETE
AR max vel: 2.27 cm2
AV Area VTI: 2.28 cm2
AV Area mean vel: 2.29 cm2
AV Mean grad: 3 mmHg
AV Peak grad: 5.6 mmHg
Ao pk vel: 1.18 m/s
Area-P 1/2: 4.06 cm2
S' Lateral: 3.6 cm

## 2023-08-05 LAB — CALCIUM, IONIZED: Calcium, Ionized, Serum: 3.1 mg/dL — ABNORMAL LOW (ref 4.5–5.6)

## 2023-08-05 LAB — HEMOGLOBIN A1C
Hgb A1c MFr Bld: 5.6 % (ref 4.8–5.6)
Mean Plasma Glucose: 114 mg/dL

## 2023-08-05 LAB — PHOSPHORUS: Phosphorus: 3.5 mg/dL (ref 2.5–4.6)

## 2023-08-05 LAB — MAGNESIUM: Magnesium: 1.7 mg/dL (ref 1.7–2.4)

## 2023-08-05 MED ORDER — CALCIUM GLUCONATE-NACL 2-0.675 GM/100ML-% IV SOLN
2.0000 g | Freq: Once | INTRAVENOUS | Status: AC
  Administered 2023-08-05: 2000 mg via INTRAVENOUS
  Filled 2023-08-05: qty 100

## 2023-08-05 MED ORDER — METRONIDAZOLE 500 MG PO TABS: 500.0000 mg | ORAL_TABLET | Freq: Every day | ORAL | Status: DC

## 2023-08-05 MED ORDER — PROSOURCE PLUS PO LIQD
30.0000 mL | Freq: Two times a day (BID) | ORAL | Status: DC
  Administered 2023-08-07 – 2023-08-09 (×5): 30 mL via ORAL
  Filled 2023-08-05 (×6): qty 30

## 2023-08-05 MED ORDER — MAGNESIUM SULFATE 2 GM/50ML IV SOLN
2.0000 g | Freq: Once | INTRAVENOUS | Status: AC
  Administered 2023-08-05: 2 g via INTRAVENOUS
  Filled 2023-08-05: qty 50

## 2023-08-05 MED ORDER — FUROSEMIDE 10 MG/ML IJ SOLN
20.0000 mg | Freq: Once | INTRAMUSCULAR | Status: AC
  Administered 2023-08-05: 20 mg via INTRAVENOUS
  Filled 2023-08-05: qty 2

## 2023-08-05 MED ORDER — AZITHROMYCIN 500 MG PO TABS
250.0000 mg | ORAL_TABLET | Freq: Every day | ORAL | Status: AC
  Administered 2023-08-05 – 2023-08-07 (×3): 250 mg via ORAL
  Filled 2023-08-05 (×3): qty 1

## 2023-08-05 MED ORDER — ALBUTEROL SULFATE (2.5 MG/3ML) 0.083% IN NEBU: 2.5000 mg | INHALATION_SOLUTION | RESPIRATORY_TRACT | Status: DC | PRN

## 2023-08-05 MED ORDER — METRONIDAZOLE 500 MG PO TABS
250.0000 mg | ORAL_TABLET | Freq: Every day | ORAL | Status: DC
  Administered 2023-08-06 – 2023-08-09 (×4): 250 mg via ORAL
  Filled 2023-08-05 (×5): qty 1

## 2023-08-05 MED ORDER — CALCIUM GLUCONATE-NACL 1-0.675 GM/50ML-% IV SOLN
1.0000 g | Freq: Once | INTRAVENOUS | Status: AC
  Administered 2023-08-05: 1000 mg via INTRAVENOUS
  Filled 2023-08-05: qty 50

## 2023-08-05 NOTE — Evaluation (Signed)
Physical Therapy Evaluation Patient Details Name: Andrea Brooks MRN: [id number] DOB: May 08, 1936 Today's Date: 08/05/2023  History of Present Illness  87 y.o. female admitted 9/11 with numbness in extremities, weakness and fatigue with hypokalemia. PMhx: T2DM, HTN, HLD, CHF, GERD, anxiety, pneumatosis cystoides  Clinical Impression  Pt pleasant and reports progressive weakness, numbness for 2weeks. Pt able to walk with assist of RW with desaturation to 88%. Pt states SOB with activity recently but no prior O2 use. Pt with decreased activity tolerance, transfers and gait who will benefit from acute therapy to maximize mobility, safety and independence. Encouraged OOB daily with nursing staff and mobility        If plan is discharge home, recommend the following: Assistance with cooking/housework;Assist for transportation;Help with stairs or ramp for entrance   Can travel by private vehicle        Equipment Recommendations None recommended by PT  Recommendations for Other Services       Functional Status Assessment Patient has had a recent decline in their functional status and demonstrates the ability to make significant improvements in function in a reasonable and predictable amount of time.     Precautions / Restrictions Precautions Precautions: Fall;Other (comment) Precaution Comments: watch sats      Mobility  Bed Mobility Overal bed mobility: Modified Independent             General bed mobility comments: HOB 30 degrees with use of rail, bed flat and no rail at home    Transfers Overall transfer level: Modified independent                      Ambulation/Gait Ambulation/Gait assistance: Supervision Gait Distance (Feet): 300 Feet Assistive device: Rolling walker (2 wheels) Gait Pattern/deviations: Step-through pattern, Decreased stride length, Trunk flexed   Gait velocity interpretation: <1.8 ft/sec, indicate of risk for recurrent falls   General Gait  Details: pt with kyphotic posture with noted fatigue after 150' with standing rest x 3 and desaturation to 88% noted end of gait. Cues for posture, proximity to RW and direction  Stairs            Wheelchair Mobility     Tilt Bed    Modified Rankin (Stroke Patients Only)       Balance Overall balance assessment: Mild deficits observed, not formally tested                                           Pertinent Vitals/Pain Pain Assessment Pain Assessment: 0-10 Pain Score: 4  Pain Location: Rt flank with gait Pain Descriptors / Indicators: Sore Pain Intervention(s): Limited activity within patient's tolerance, Repositioned, Monitored during session    Home Living Family/patient expects to be discharged to:: Private residence Living Arrangements: Children;Spouse/significant other Available Help at Discharge: Family;Available 24 hours/day Type of Home: House Home Access: Stairs to enter   [employer] of Steps: 3 or 8   Home Layout: Two level;Able to live on main level with bedroom/bathroom Home Equipment: Rolling Walker (2 wheels);Cane - single point;Rollator (4 wheels);Wheelchair - manual      Prior Function Prior Level of Function : Independent/Modified Independent             Mobility Comments: normally walks without AD unless [place] distance ADLs Comments: daughter does the cooking, housekeeper for cleaning     Extremity/Trunk Assessment   Upper  Extremity Assessment Upper Extremity Assessment: Generalized weakness    Lower Extremity Assessment Lower Extremity Assessment: Overall WFL for tasks assessed    Cervical / Trunk Assessment Cervical / Trunk Assessment: Kyphotic  Communication   Communication Communication: No apparent difficulties  Cognition Arousal: Alert Behavior During Therapy: WFL for tasks assessed/performed Overall Cognitive Status: Within Functional Limits for tasks assessed                                           General Comments      Exercises     Assessment/Plan    PT Assessment Patient needs continued PT services  PT Problem List Decreased activity tolerance;Decreased knowledge of use of DME;Cardiopulmonary status limiting activity       PT Treatment Interventions DME instruction;Gait training;Stair training;Functional mobility training;Therapeutic activities;Patient/family education;Therapeutic exercise    PT Goals (Current goals can be found in the Care Plan section)  Acute Rehab PT Goals Patient Stated Goal: return home PT Goal Formulation: With patient Time For Goal Achievement: 08/19/23 Potential to Achieve Goals: Good    Frequency Min 1X/week     Co-evaluation               AM-PAC PT "6 Clicks" Mobility  Outcome Measure Help needed turning from your back to your side while in a flat bed without using bedrails?: None Help needed moving from lying on your back to sitting on the side of a flat bed without using bedrails?: A Little Help needed moving to and from a bed to a chair (including a wheelchair)?: A Little Help needed standing up from a chair using your arms (e.g., wheelchair or bedside chair)?: A Little Help needed to walk in hospital room?: A Little Help needed climbing 3-5 steps with a railing? : A Little 6 Click Score: 19    End of Session   Activity Tolerance: Patient tolerated treatment well Patient left: in chair;with call bell/phone within reach;with chair alarm set Nurse Communication: Mobility status PT Visit Diagnosis: Other abnormalities of gait and mobility (R26.89)    Time: 0933-1000 PT Time Calculation (min) (ACUTE ONLY): 27 min   Charges:   PT Evaluation $PT Eval Moderate Complexity: 1 Mod PT Treatments $Therapeutic Activity: 8-22 mins PT General Charges $$ ACUTE PT VISIT: 1 Visit         Merryl Hacker, PT Acute Rehabilitation Services Office: [phone]   Reneshia Zuccaro B Romualdo Prosise 08/05/2023, 10:10  AM

## 2023-08-05 NOTE — Progress Notes (Signed)
*  PRELIMINARY RESULTS* Echocardiogram 2D Echocardiogram has been performed.  Andrea Brooks 08/05/2023, 2:19 PM

## 2023-08-05 NOTE — Progress Notes (Signed)
Patient to vascular.

## 2023-08-05 NOTE — Evaluation (Signed)
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   Left forearm and Left forearm superior   Past Surgical History:  Past Surgical History:  Procedure Laterality Date   ABDOMINAL HYSTERECTOMY     total   BREAST BIOPSY Left    neg   CHOLECYSTECTOMY     JOINT REPLACEMENT Bilateral    knee   LAPAROTOMY N/A 03/11/2021   Procedure: EXPLORATORY LAPAROTOMY;  Surgeon: Berna Bue, MD;  Location: [facility] OR;  Service: General;  Laterality: N/A;   LEFT HEART CATH AND CORONARY ANGIOGRAPHY N/A 07/04/2018   Procedure: LEFT HEART CATH AND CORONARY ANGIOGRAPHY;  Surgeon: Corky Crafts, MD;  Location: [facility] INVASIVE CV LAB;  Service: Cardiovascular;  Laterality: N/A;   REPLACEMENT TOTAL KNEE BILATERAL     HPI:  Andrea Brooks is an 87 yo female presenting to ED 9/11 with BUE tingling/numbness and generalized weakness. CXR 9/11 significant for R basilar atelectasis or infiltrate with small R pleural effusion. PMH includes T2DM, HTN, HLD, CHF, GERD, anxiety, pneumatosis cystoides    Assessment / Plan / Recommendation  Clinical Impression  Pt reports no acute concerns with swallowing, although states that she has reflux which she manages with daily medication. Oral motor exam WFL. Trials of thin liquids, purees, and solids observed without any clinical signs concerning for dyspahgia or aspiration. Provided education  regarding esophageal precautions. Per MD, pt cleared for more solid textures therefore recommend regular texture diet with thin liquids. SLP to s/o at this time. SLP Visit Diagnosis: Dysphagia, unspecified (R13.10)    Aspiration Risk  Mild aspiration risk    Diet Recommendation Regular;Thin liquid    Liquid Administration via: Cup;Straw Medication Administration: Whole meds with liquid Supervision: Patient able to self feed Compensations: Slow rate;Small sips/bites Postural Changes: Seated upright at 90 degrees;Remain upright for at least 30 minutes after po intake    Other  Recommendations Oral Care Recommendations: Oral care BID    Recommendations for follow up therapy are one component of a multi-disciplinary discharge planning process, led by the attending physician.  Recommendations may be updated based on patient status, additional functional criteria and insurance authorization.  Follow up Recommendations No SLP follow up      Assistance Recommended at Discharge    Functional Status Assessment Patient has not had a recent decline in their functional status  Frequency and Duration            Prognosis Prognosis for improved oropharyngeal function: Good      Swallow Study   General HPI: Andrea Brooks is an 87 yo female presenting to ED 9/11 with BUE tingling/numbness and generalized weakness. CXR 9/11 significant for R basilar atelectasis or infiltrate with small R pleural effusion. PMH includes T2DM, HTN, HLD, CHF, GERD, anxiety, pneumatosis cystoides Type of Study: Bedside Swallow Evaluation Previous Swallow Assessment: none in chart Diet Prior  to this Study: Full liquid diet Temperature Spikes Noted: No Respiratory Status: Room air History of Recent Intubation: No Behavior/Cognition: Alert;Cooperative;Pleasant mood Oral Cavity Assessment: Within Functional Limits Oral Care Completed by SLP: No Oral Cavity - Dentition: Adequate natural dentition Vision: Functional  for self-feeding Self-Feeding Abilities: Able to feed self Patient Positioning: Upright in chair Baseline Vocal Quality: Normal Volitional Cough: Strong Volitional Swallow: Able to elicit    Oral/Motor/Sensory Function Overall Oral Motor/Sensory Function: Within functional limits   Ice Chips Ice chips: Not tested   Thin Liquid Thin Liquid: Within functional limits Presentation: Straw;Self Fed    Nectar Thick Nectar Thick Liquid: Not tested   Honey Thick Honey Thick Liquid: Not tested   Puree Puree: Within functional limits Presentation: Spoon;Self Fed   Solid     Solid: Within functional limits Presentation: Self Fed      Gwynneth Aliment, M.A., CF-SLP Speech Language Pathology, Acute Rehabilitation Services  Secure Chat preferred [phone]  08/05/2023,1:01 PM

## 2023-08-05 NOTE — Progress Notes (Signed)
PROGRESS NOTE                                                                                                                                                                                                             Patient Demographics:    Andrea Brooks, is a 87 y.o. female, DOB - 23-Aug-1936, UUV:[id number]  Outpatient Primary MD for the patient is Melida Quitter, PA    LOS - 0  Admit date - 08/04/2023    Chief Complaint  Patient presents with   Numbness   Chest Pain   Tingling       Brief Narrative (HPI from H&P)     87 y.o. female with PMH significant for DM2, HTN, HLD, CHF, GERD, anxiety, Per family, patient has a rare condition called pneumatosis cystoides in which she has some 'blisters' in the intestinal wall that gives her intermittent gastroenteritis symptoms.  She follows up at [facility] and is on pyridostigmine bromide daily which slows down her bowel.  To prevent bacterial overgrowth from slow bowel, patient has been taking Flagyl 500 mg daily.   Due to her GI condition at baseline she has poor appetite does not eat well, recently her Flagyl was increased to 500 twice a day but she stopped taking it as she was developing tingling numbness, she presented to the ER as she started having tingling numbness in her extremities and thought this was due to Flagyl, in the ER she was found to have PCM, hypomagnesemia, hypocalcemia and she was admitted for further care.   Subjective:    Andrea Brooks today has, No headache, No chest pain, No abdominal pain - No Nausea, No new weakness tingling or numbness, no SOB   Assessment  & Plan :    Poor oral intake and malabsorption due to underlying GI issues, moderate protein calorie malnutrition, hypomagnesemia, hypokalemia, severe hypocalcemia causing tingling numbness. She has been placed on oral protein supplements, replace magnesium and calcium, monitor electrolytes.   Advance activity.  PT OT.   Possible right lower lobe pneumonia upon admission.  Placed on empiric IV antibiotics with improvement.  SLP eval.  Continue to monitor.  Prolonged QTc.  Due to multiple electrolyte abnormalities.  Replace electrolytes, telemetry monitor, repeat EKG morning of 08/06/2023.  Pneumatosis cystoides/ SIBO - Resume Mestinon and Flagyl.  Post discharge follow-up  with gastroenterologist at [facility].  Possible underlying chronic diastolic CHF- obtain echocardiogram, hold IV fluids, has developed some crackles, gentle Lasix and monitor.    Mildly elevated troponin at the time of admission.  Not an ACS pattern, trend is flat, chest pain-free, continue beta-blocker, aspirin and statin for secondary prevention, echo pending.  HTN -  Coreg, valsartan, Resume both - Continue monitor blood pressure   HLD   on aspirin, statin   GERD - PPI  DM2 - ISS  Lab Results  Component Value Date   HGBA1C 5.6 08/04/2023   CBG (last 3)  Recent Labs    08/04/23 1708 08/04/23 2134 08/05/23 0815  GLUCAP 97 212* 82        Condition - Fair  Family Communication  :  None  Code Status :  Full  Consults  :  None  PUD Prophylaxis :    Procedures  :            Disposition Plan  :    Status is: Observation  DVT Prophylaxis  :    enoxaparin (LOVENOX) injection 40 mg Start: 08/04/23 2200    Lab Results  Component Value Date   PLT 135 (L) 08/05/2023    Diet :  Diet Order             Diet full liquid Room service appropriate? Yes; Fluid consistency: Thin  Diet effective now                    Inpatient Medications  Scheduled Meds:  (feeding supplement) PROSource Plus  30 mL Oral BID BM   aspirin EC  81 mg Oral Daily   carvedilol  6.25 mg Oral BID WC   enoxaparin (LOVENOX) injection  40 mg Subcutaneous Q24H   furosemide  20 mg Intravenous Once   insulin aspart  0-5 Units Subcutaneous QHS   insulin aspart  0-9 Units Subcutaneous TID WC    irbesartan  75 mg Oral Daily   [START ON 08/06/2023] metroNIDAZOLE  500 mg Oral Q0600   pantoprazole  40 mg Oral Daily   polyethylene glycol  8.5 g Oral Daily   pyridostigmine  60 mg Oral Daily   simvastatin  20 mg Oral QHS   Continuous Infusions:  calcium gluconate     cefTRIAXone (ROCEPHIN)  IV     PRN Meds:.acetaminophen **OR** acetaminophen, albuterol, hydrALAZINE, morphine injection, oxyCODONE   Objective:   Vitals:   08/05/23 0425 08/05/23 0800 08/05/23 0815 08/05/23 1000  BP:   (!) 181/110   Pulse:   86   Resp:  (!) 22 (!) 24   Temp:   98.6 F (37 C)   TempSrc:   Oral   SpO2: 94%  94% 94%    Wt Readings from Last 3 Encounters:  07/22/23 62.1 kg  04/08/23 62.1 kg  03/17/23 64.4 kg    No intake or output data in the 24 hours ending 08/05/23 1100   Physical Exam  Awake Alert, No new F.N deficits, Normal affect [place].AT,PERRAL Supple Neck, No JVD,   Symmetrical Chest wall movement, Good air movement bilaterally, CTAB RRR,No Gallops,Rubs or new Murmurs,  +ve B.Sounds, Abd Soft, No tenderness,   No Cyanosis, Clubbing or edema        Data Review:    Recent Labs  Lab 08/04/23 1148 08/05/23 0354  WBC 25.1* 13.3*  HGB 13.5 12.5  HCT 41.9 38.7  PLT 256 135*  MCV 92.7 92.6  MCH  29.9 29.9  MCHC 32.2 32.3  RDW 13.8 13.9    Recent Labs  Lab 08/04/23 1148 08/04/23 1354 08/04/23 1515 08/05/23 0354 08/05/23 0916  NA 140  --   --  138  --   K 3.0*  --   --  4.2  --   CL 101  --   --  103  --   CO2 25  --   --  20*  --   ANIONGAP 14  --   --  15  --   GLUCOSE 183*  --   --  86  --   BUN 17  --   --  18  --   CREATININE 1.09*  --   --  0.71  --   AST  --  21  --   --  21  ALT  --  12  --   --  11  ALKPHOS  --  50  --   --  49  BILITOT  --  0.7  --   --  0.8  ALBUMIN  --  2.6*  --   --  2.3*  PROCALCITON  --   --  0.76  --   --   HGBA1C  --   --  5.6  --   --   MG  --   --   --  1.7  --   CALCIUM 5.9*  --   --  6.3*  --       Recent Labs  Lab  08/04/23 1148 08/04/23 1515 08/05/23 0354  PROCALCITON  --  0.76  --   HGBA1C  --  5.6  --   MG  --   --  1.7  CALCIUM 5.9*  --  6.3*    Lab Results  Component Value Date   HGBA1C 5.6 08/04/2023   Radiology Reports DG Chest 2 View  Result Date: 08/04/2023 CLINICAL DATA:  Chest pain. EXAM: CHEST - 2 VIEW COMPARISON:  July 22, 2023. FINDINGS: Stable cardiomediastinal silhouette. Hypoinflation of the lungs is noted. Left lung is clear. Mild right basilar atelectasis or infiltrate is noted with small right pleural effusion. Bony thorax is unremarkable. IMPRESSION: Hypoinflation of the lungs. Mild right basilar atelectasis or infiltrate with small right pleural effusion. Electronically Signed   By: Lupita Raider M.D.   On: 08/04/2023 12:48      Signature  -   Susa Raring M.D on 08/05/2023 at 11:00 AM   -  To page go to [email or web address]

## 2023-08-05 NOTE — Plan of Care (Signed)
  Problem: Education: Goal: Ability to describe self-care measures that may prevent or decrease complications (Diabetes Survival Skills Education) will improve Outcome: Progressing Goal: Individualized Educational Video(s) Outcome: Progressing   Problem: Fluid Volume: Goal: Ability to maintain a balanced intake and output will improve Outcome: Progressing   Problem: Health Behavior/Discharge Planning: Goal: Ability to identify and utilize available resources and services will improve Outcome: Progressing Goal: Ability to manage health-related needs will improve Outcome: Progressing   Problem: Nutritional: Goal: Maintenance of adequate nutrition will improve Outcome: Progressing   Problem: Skin Integrity: Goal: Risk for impaired skin integrity will decrease Outcome: Progressing   Problem: Tissue Perfusion: Goal: Adequacy of tissue perfusion will improve Outcome: Progressing   Problem: Education: Goal: Knowledge of General Education information will improve Description: Including pain rating scale, medication(s)/side effects and non-pharmacologic comfort measures Outcome: Progressing   Problem: Clinical Measurements: Goal: Ability to maintain clinical measurements within normal limits will improve Outcome: Progressing Goal: Will remain free from infection Outcome: Progressing Goal: Respiratory complications will improve Outcome: Progressing Goal: Cardiovascular complication will be avoided Outcome: Progressing

## 2023-08-06 DIAGNOSIS — E876 Hypokalemia: Secondary | ICD-10-CM | POA: Diagnosis not present

## 2023-08-06 LAB — GLUCOSE, CAPILLARY
Glucose-Capillary: 101 mg/dL — ABNORMAL HIGH (ref 70–99)
Glucose-Capillary: 142 mg/dL — ABNORMAL HIGH (ref 70–99)
Glucose-Capillary: 91 mg/dL (ref 70–99)
Glucose-Capillary: 174 mg/dL — ABNORMAL HIGH (ref 70–99)

## 2023-08-06 LAB — CBC WITH DIFFERENTIAL/PLATELET
Abs Immature Granulocytes: 0.04 10*3/uL (ref 0.00–0.07)
Basophils Absolute: 0 10*3/uL (ref 0.0–0.1)
Basophils Relative: 0 %
Eosinophils Absolute: 0.1 10*3/uL (ref 0.0–0.5)
Eosinophils Relative: 2 %
HCT: 39.8 % (ref 36.0–46.0)
Hemoglobin: 12.8 g/dL (ref 12.0–15.0)
Immature Granulocytes: 1 %
Lymphocytes Relative: 14 %
Lymphs Abs: 1.2 10*3/uL (ref 0.7–4.0)
MCH: 29 pg (ref 26.0–34.0)
MCHC: 32.2 g/dL (ref 30.0–36.0)
MCV: 90.2 fL (ref 80.0–100.0)
Monocytes Absolute: 0.5 10*3/uL (ref 0.1–1.0)
Monocytes Relative: 7 %
Neutro Abs: 6.2 10*3/uL (ref 1.7–7.7)
Neutrophils Relative %: 76 %
Platelets: 257 10*3/uL (ref 150–400)
RBC: 4.41 MIL/uL (ref 3.87–5.11)
RDW: 13.4 % (ref 11.5–15.5)
WBC: 8.1 10*3/uL (ref 4.0–10.5)
nRBC: 0 % (ref 0.0–0.2)

## 2023-08-06 LAB — COMPREHENSIVE METABOLIC PANEL
ALT: 13 U/L (ref 0–44)
AST: 24 U/L (ref 15–41)
Albumin: 2.3 g/dL — ABNORMAL LOW (ref 3.5–5.0)
Alkaline Phosphatase: 50 U/L (ref 38–126)
Anion gap: 13 (ref 5–15)
BUN: 11 mg/dL (ref 8–23)
CO2: 22 mmol/L (ref 22–32)
Calcium: 8 mg/dL — ABNORMAL LOW (ref 8.9–10.3)
Chloride: 98 mmol/L (ref 98–111)
Creatinine, Ser: 0.84 mg/dL (ref 0.44–1.00)
GFR, Estimated: >60 mL/min (ref >60)
Glucose, Bld: 147 mg/dL — ABNORMAL HIGH (ref 70–99)
Potassium: 3.9 mmol/L (ref 3.5–5.1)
Sodium: 133 mmol/L — ABNORMAL LOW (ref 135–145)
Total Bilirubin: 0.7 mg/dL (ref 0.3–1.2)
Total Protein: 6.5 g/dL (ref 6.5–8.1)

## 2023-08-06 LAB — BRAIN NATRIURETIC PEPTIDE: B Natriuretic Peptide: 735.7 pg/mL — ABNORMAL HIGH (ref 0.0–100.0)

## 2023-08-06 LAB — URINE CULTURE

## 2023-08-06 LAB — MAGNESIUM: Magnesium: 1.7 mg/dL (ref 1.7–2.4)

## 2023-08-06 MED ORDER — CALCIUM GLUCONATE-NACL 1-0.675 GM/50ML-% IV SOLN
1.0000 g | Freq: Once | INTRAVENOUS | Status: AC
  Administered 2023-08-06: 1000 mg via INTRAVENOUS
  Filled 2023-08-06: qty 50

## 2023-08-06 MED ORDER — FUROSEMIDE 10 MG/ML IJ SOLN
20.0000 mg | Freq: Once | INTRAMUSCULAR | Status: AC
  Administered 2023-08-06: 20 mg via INTRAVENOUS
  Filled 2023-08-06: qty 2

## 2023-08-06 MED ORDER — POTASSIUM CHLORIDE CRYS ER 20 MEQ PO TBCR
40.0000 meq | EXTENDED_RELEASE_TABLET | Freq: Once | ORAL | Status: AC
  Administered 2023-08-06: 40 meq via ORAL
  Filled 2023-08-06: qty 2

## 2023-08-06 MED ORDER — FUROSEMIDE 10 MG/ML IJ SOLN
40.0000 mg | Freq: Once | INTRAMUSCULAR | Status: AC
  Administered 2023-08-06: 40 mg via INTRAVENOUS
  Filled 2023-08-06: qty 4

## 2023-08-06 MED ORDER — MAGNESIUM SULFATE 2 GM/50ML IV SOLN
2.0000 g | Freq: Once | INTRAVENOUS | Status: AC
  Administered 2023-08-06: 2 g via INTRAVENOUS
  Filled 2023-08-06: qty 50

## 2023-08-06 MED ORDER — AMLODIPINE BESYLATE 10 MG PO TABS
10.0000 mg | ORAL_TABLET | Freq: Every day | ORAL | Status: DC
  Administered 2023-08-06 – 2023-08-09 (×4): 10 mg via ORAL
  Filled 2023-08-06 (×4): qty 1

## 2023-08-06 NOTE — Progress Notes (Signed)
Nurse requested Mobility Specialist to perform oxygen saturation test with pt which includes removing pt from oxygen both at rest and while ambulating.  Below are the results from that testing.     Patient Saturations on Room Air at Rest = spO2 86%  Patient Saturations on Room Air while Ambulating = sp02 NA% .    Patient Saturations on 2 Liters of oxygen while Ambulating = sp02 90%  At end of testing pt left in room on 2  Liters of oxygen.  Reported results to nurse.   Addison Lank Mobility Specialist Please contact via SecureChat or  Rehab office at [phone]

## 2023-08-06 NOTE — Progress Notes (Signed)
Mobility Specialist Progress Note:   08/06/23 1130  Mobility  Activity Ambulated with assistance in hallway  Level of Assistance Standby assist, set-up cues, supervision of patient - no hands on  Assistive Device Front wheel walker  Distance Ambulated (ft) 150 ft  Activity Response Tolerated well  Mobility Referral Yes  $Mobility charge 1 Mobility  Mobility Specialist Start Time (ACUTE ONLY) 1130  Mobility Specialist Stop Time (ACUTE ONLY) 1150  Mobility Specialist Time Calculation (min) (ACUTE ONLY) 20 min    Pre Mobility: SPO2 94% 2L During Mobility: SPO2 90% 2L Post Mobility: SPO2 97% 2L  Pt agreeable to mobility. Required only SV, no physical assistance required. 2L O2 needed to maintain SPO2 WFL during ambulation. Pt left in chair on 2L with all needs met.  Addison Lank Mobility Specialist Please contact via SecureChat or  Rehab office at [phone]

## 2023-08-06 NOTE — Progress Notes (Signed)
Lab Results  Component Value Date   HGBA1C 5.6 08/04/2023   Radiology Reports ECHOCARDIOGRAM COMPLETE  Result Date: 08/05/2023    ECHOCARDIOGRAM REPORT   Patient Name:   Andrea Brooks Date of Exam: 08/05/2023 Medical Rec #:  [id number]      Height:       63.0 in  Accession #:    [id number]     Weight:       136.9 lb Date of Birth:  Apr 17, 1936       BSA:          1.646 m Patient Age:    87 years       BP:           161/68 mmHg Patient Gender: F              HR:           75 bpm. Exam Location:  Inpatient Procedure: 2D Echo, Cardiac Doppler and Color Doppler Indications:    Abnormal ECG 794.31 / R94.31                 Elevated Troponin  History:        Patient has prior history of Echocardiogram examinations, most                 recent 07/03/2018. CHF, Previous Myocardial Infarction; Risk                 Factors:Diabetes, Dyslipidemia and Former Smoker.  Sonographer:    Dondra Prader RVT Referring Phys: [id number] [place] [facility] IMPRESSIONS  1. Left ventricular ejection fraction, by estimation, is 60 to 65%. The left ventricle has normal function. The left ventricle has no regional wall motion abnormalities. There is mild concentric left ventricular hypertrophy. Left ventricular diastolic parameters are consistent with Grade II diastolic dysfunction (pseudonormalization).  2. Right ventricular systolic function is normal. The right ventricular size is normal.  3. Left atrial size was moderately dilated.  4. The mitral valve is normal in structure. Mild mitral valve regurgitation. No evidence of mitral stenosis.  5. The aortic valve is tricuspid. There is mild calcification of the aortic valve. Aortic valve regurgitation is not visualized. Aortic valve sclerosis/calcification is present, without any evidence of aortic stenosis.  6. Aortic dilatation noted. There is mild dilatation of the ascending aorta, measuring 42 mm.  7. The inferior vena cava is normal in size with greater than 50% respiratory variability, suggesting right atrial pressure of 3 mmHg. FINDINGS  Left Ventricle: Left ventricular ejection fraction, by estimation, is 60 to 65%. The left ventricle has normal function. The left ventricle has no regional wall motion abnormalities. The left ventricular internal cavity size  was normal in size. There is  mild concentric left ventricular hypertrophy. Left ventricular diastolic parameters are consistent with Grade II diastolic dysfunction (pseudonormalization). Right Ventricle: The right ventricular size is normal. No increase in right ventricular wall thickness. Right ventricular systolic function is normal. Left Atrium: Left atrial size was moderately dilated. Right Atrium: Right atrial size was normal in size. Pericardium: There is no evidence of pericardial effusion. Mitral Valve: The mitral valve is normal in structure. Mild mitral annular calcification. Mild mitral valve regurgitation. No evidence of mitral valve stenosis. Tricuspid Valve: The tricuspid valve is normal in structure. Tricuspid valve regurgitation is mild . No evidence of tricuspid stenosis. Aortic Valve: The aortic valve is tricuspid. There is mild calcification of the aortic valve. Aortic valve regurgitation is not visualized. Aortic valve sclerosis/calcification  PROGRESS NOTE                                                                                                                                                                                                             Patient Demographics:    Andrea Brooks, is a 87 y.o. female, DOB - 03/07/1936, GUR:[id number]  Outpatient Primary MD for the patient is Melida Quitter, PA    LOS - 1  Admit date - 08/04/2023    Chief Complaint  Patient presents with   Numbness   Chest Pain   Tingling       Brief Narrative (HPI from H&P)     87 y.o. female with PMH significant for DM2, HTN, HLD, CHF, GERD, anxiety, Per family, patient has a rare condition called pneumatosis cystoides in which she has some 'blisters' in the intestinal wall that gives her intermittent gastroenteritis symptoms.  She follows up at [facility] and is on pyridostigmine bromide daily which slows down her bowel.  To prevent bacterial overgrowth from slow bowel, patient has been taking Flagyl 500 mg daily.   Due to her GI condition at baseline she has poor appetite does not eat well, recently her Flagyl was increased to 500 twice a day but she stopped taking it as she was developing tingling numbness, she presented to the ER as she started having tingling numbness in her extremities and thought this was due to Flagyl, in the ER she was found to have PCM, hypomagnesemia, hypocalcemia and she was admitted for further care.   Subjective:    Andrea Brooks today has, No headache, No chest pain, No abdominal pain - No Nausea, No new weakness tingling or numbness, no SOB   Assessment  & Plan :    Poor oral intake and malabsorption due to underlying GI issues, moderate protein calorie malnutrition, hypomagnesemia, hypokalemia, severe hypocalcemia causing tingling numbness. She has been placed on oral protein supplements, replace magnesium and calcium, monitor electrolytes.   Advance activity.  PT OT.   Possible right lower lobe pneumonia upon admission.  Placed on empiric IV antibiotics with improvement.  SLP eval.  Continue to monitor.  Acute on Chr. dCHF EF 60% - lasix, B Blcoker.  Prolonged QTc.  Due to multiple electrolyte abnormalities.  Replace electrolytes, telemetry monitor, repeat EKG on 08/06/2023.  Pneumatosis cystoides/  PROGRESS NOTE                                                                                                                                                                                                             Patient Demographics:    Andrea Brooks, is a 87 y.o. female, DOB - 03/07/1936, GUR:[id number]  Outpatient Primary MD for the patient is Melida Quitter, PA    LOS - 1  Admit date - 08/04/2023    Chief Complaint  Patient presents with   Numbness   Chest Pain   Tingling       Brief Narrative (HPI from H&P)     87 y.o. female with PMH significant for DM2, HTN, HLD, CHF, GERD, anxiety, Per family, patient has a rare condition called pneumatosis cystoides in which she has some 'blisters' in the intestinal wall that gives her intermittent gastroenteritis symptoms.  She follows up at [facility] and is on pyridostigmine bromide daily which slows down her bowel.  To prevent bacterial overgrowth from slow bowel, patient has been taking Flagyl 500 mg daily.   Due to her GI condition at baseline she has poor appetite does not eat well, recently her Flagyl was increased to 500 twice a day but she stopped taking it as she was developing tingling numbness, she presented to the ER as she started having tingling numbness in her extremities and thought this was due to Flagyl, in the ER she was found to have PCM, hypomagnesemia, hypocalcemia and she was admitted for further care.   Subjective:    Andrea Brooks today has, No headache, No chest pain, No abdominal pain - No Nausea, No new weakness tingling or numbness, no SOB   Assessment  & Plan :    Poor oral intake and malabsorption due to underlying GI issues, moderate protein calorie malnutrition, hypomagnesemia, hypokalemia, severe hypocalcemia causing tingling numbness. She has been placed on oral protein supplements, replace magnesium and calcium, monitor electrolytes.   Advance activity.  PT OT.   Possible right lower lobe pneumonia upon admission.  Placed on empiric IV antibiotics with improvement.  SLP eval.  Continue to monitor.  Acute on Chr. dCHF EF 60% - lasix, B Blcoker.  Prolonged QTc.  Due to multiple electrolyte abnormalities.  Replace electrolytes, telemetry monitor, repeat EKG on 08/06/2023.  Pneumatosis cystoides/  Lab Results  Component Value Date   HGBA1C 5.6 08/04/2023   Radiology Reports ECHOCARDIOGRAM COMPLETE  Result Date: 08/05/2023    ECHOCARDIOGRAM REPORT   Patient Name:   Andrea Brooks Date of Exam: 08/05/2023 Medical Rec #:  [id number]      Height:       63.0 in  Accession #:    [id number]     Weight:       136.9 lb Date of Birth:  Apr 17, 1936       BSA:          1.646 m Patient Age:    87 years       BP:           161/68 mmHg Patient Gender: F              HR:           75 bpm. Exam Location:  Inpatient Procedure: 2D Echo, Cardiac Doppler and Color Doppler Indications:    Abnormal ECG 794.31 / R94.31                 Elevated Troponin  History:        Patient has prior history of Echocardiogram examinations, most                 recent 07/03/2018. CHF, Previous Myocardial Infarction; Risk                 Factors:Diabetes, Dyslipidemia and Former Smoker.  Sonographer:    Dondra Prader RVT Referring Phys: [id number] [facility] [place] [facility] IMPRESSIONS  1. Left ventricular ejection fraction, by estimation, is 60 to 65%. The left ventricle has normal function. The left ventricle has no regional wall motion abnormalities. There is mild concentric left ventricular hypertrophy. Left ventricular diastolic parameters are consistent with Grade II diastolic dysfunction (pseudonormalization).  2. Right ventricular systolic function is normal. The right ventricular size is normal.  3. Left atrial size was moderately dilated.  4. The mitral valve is normal in structure. Mild mitral valve regurgitation. No evidence of mitral stenosis.  5. The aortic valve is tricuspid. There is mild calcification of the aortic valve. Aortic valve regurgitation is not visualized. Aortic valve sclerosis/calcification is present, without any evidence of aortic stenosis.  6. Aortic dilatation noted. There is mild dilatation of the ascending aorta, measuring 42 mm.  7. The inferior vena cava is normal in size with greater than 50% respiratory variability, suggesting right atrial pressure of 3 mmHg. FINDINGS  Left Ventricle: Left ventricular ejection fraction, by estimation, is 60 to 65%. The left ventricle has normal function. The left ventricle has no regional wall motion abnormalities. The left ventricular internal cavity size  was normal in size. There is  mild concentric left ventricular hypertrophy. Left ventricular diastolic parameters are consistent with Grade II diastolic dysfunction (pseudonormalization). Right Ventricle: The right ventricular size is normal. No increase in right ventricular wall thickness. Right ventricular systolic function is normal. Left Atrium: Left atrial size was moderately dilated. Right Atrium: Right atrial size was normal in size. Pericardium: There is no evidence of pericardial effusion. Mitral Valve: The mitral valve is normal in structure. Mild mitral annular calcification. Mild mitral valve regurgitation. No evidence of mitral valve stenosis. Tricuspid Valve: The tricuspid valve is normal in structure. Tricuspid valve regurgitation is mild . No evidence of tricuspid stenosis. Aortic Valve: The aortic valve is tricuspid. There is mild calcification of the aortic valve. Aortic valve regurgitation is not visualized. Aortic valve sclerosis/calcification  Lab Results  Component Value Date   HGBA1C 5.6 08/04/2023   Radiology Reports ECHOCARDIOGRAM COMPLETE  Result Date: 08/05/2023    ECHOCARDIOGRAM REPORT   Patient Name:   Andrea Brooks Date of Exam: 08/05/2023 Medical Rec #:  [id number]      Height:       63.0 in  Accession #:    [id number]     Weight:       136.9 lb Date of Birth:  Apr 17, 1936       BSA:          1.646 m Patient Age:    87 years       BP:           161/68 mmHg Patient Gender: F              HR:           75 bpm. Exam Location:  Inpatient Procedure: 2D Echo, Cardiac Doppler and Color Doppler Indications:    Abnormal ECG 794.31 / R94.31                 Elevated Troponin  History:        Patient has prior history of Echocardiogram examinations, most                 recent 07/03/2018. CHF, Previous Myocardial Infarction; Risk                 Factors:Diabetes, Dyslipidemia and Former Smoker.  Sonographer:    Dondra Prader RVT Referring Phys: [id number] [facility] [place] [facility] IMPRESSIONS  1. Left ventricular ejection fraction, by estimation, is 60 to 65%. The left ventricle has normal function. The left ventricle has no regional wall motion abnormalities. There is mild concentric left ventricular hypertrophy. Left ventricular diastolic parameters are consistent with Grade II diastolic dysfunction (pseudonormalization).  2. Right ventricular systolic function is normal. The right ventricular size is normal.  3. Left atrial size was moderately dilated.  4. The mitral valve is normal in structure. Mild mitral valve regurgitation. No evidence of mitral stenosis.  5. The aortic valve is tricuspid. There is mild calcification of the aortic valve. Aortic valve regurgitation is not visualized. Aortic valve sclerosis/calcification is present, without any evidence of aortic stenosis.  6. Aortic dilatation noted. There is mild dilatation of the ascending aorta, measuring 42 mm.  7. The inferior vena cava is normal in size with greater than 50% respiratory variability, suggesting right atrial pressure of 3 mmHg. FINDINGS  Left Ventricle: Left ventricular ejection fraction, by estimation, is 60 to 65%. The left ventricle has normal function. The left ventricle has no regional wall motion abnormalities. The left ventricular internal cavity size  was normal in size. There is  mild concentric left ventricular hypertrophy. Left ventricular diastolic parameters are consistent with Grade II diastolic dysfunction (pseudonormalization). Right Ventricle: The right ventricular size is normal. No increase in right ventricular wall thickness. Right ventricular systolic function is normal. Left Atrium: Left atrial size was moderately dilated. Right Atrium: Right atrial size was normal in size. Pericardium: There is no evidence of pericardial effusion. Mitral Valve: The mitral valve is normal in structure. Mild mitral annular calcification. Mild mitral valve regurgitation. No evidence of mitral valve stenosis. Tricuspid Valve: The tricuspid valve is normal in structure. Tricuspid valve regurgitation is mild . No evidence of tricuspid stenosis. Aortic Valve: The aortic valve is tricuspid. There is mild calcification of the aortic valve. Aortic valve regurgitation is not visualized. Aortic valve sclerosis/calcification  PROGRESS NOTE                                                                                                                                                                                                             Patient Demographics:    Andrea Brooks, is a 87 y.o. female, DOB - 03/07/1936, GUR:[id number]  Outpatient Primary MD for the patient is Melida Quitter, PA    LOS - 1  Admit date - 08/04/2023    Chief Complaint  Patient presents with   Numbness   Chest Pain   Tingling       Brief Narrative (HPI from H&P)     87 y.o. female with PMH significant for DM2, HTN, HLD, CHF, GERD, anxiety, Per family, patient has a rare condition called pneumatosis cystoides in which she has some 'blisters' in the intestinal wall that gives her intermittent gastroenteritis symptoms.  She follows up at [facility] and is on pyridostigmine bromide daily which slows down her bowel.  To prevent bacterial overgrowth from slow bowel, patient has been taking Flagyl 500 mg daily.   Due to her GI condition at baseline she has poor appetite does not eat well, recently her Flagyl was increased to 500 twice a day but she stopped taking it as she was developing tingling numbness, she presented to the ER as she started having tingling numbness in her extremities and thought this was due to Flagyl, in the ER she was found to have PCM, hypomagnesemia, hypocalcemia and she was admitted for further care.   Subjective:    Andrea Brooks today has, No headache, No chest pain, No abdominal pain - No Nausea, No new weakness tingling or numbness, no SOB   Assessment  & Plan :    Poor oral intake and malabsorption due to underlying GI issues, moderate protein calorie malnutrition, hypomagnesemia, hypokalemia, severe hypocalcemia causing tingling numbness. She has been placed on oral protein supplements, replace magnesium and calcium, monitor electrolytes.   Advance activity.  PT OT.   Possible right lower lobe pneumonia upon admission.  Placed on empiric IV antibiotics with improvement.  SLP eval.  Continue to monitor.  Acute on Chr. dCHF EF 60% - lasix, B Blcoker.  Prolonged QTc.  Due to multiple electrolyte abnormalities.  Replace electrolytes, telemetry monitor, repeat EKG on 08/06/2023.  Pneumatosis cystoides/

## 2023-08-06 NOTE — Plan of Care (Signed)
  Problem: Education: Goal: Ability to describe self-care measures that may prevent or decrease complications (Diabetes Survival Skills Education) will improve Outcome: Progressing   Problem: Coping: Goal: Ability to adjust to condition or change in health will improve Outcome: Progressing   Problem: Elimination: Goal: Will not experience complications related to bowel motility Outcome: Progressing Goal: Will not experience complications related to urinary retention Outcome: Progressing   Problem: Pain Managment: Goal: General experience of comfort will improve Outcome: Progressing   Problem: Safety: Goal: Ability to remain free from injury will improve Outcome: Progressing

## 2023-08-06 NOTE — Progress Notes (Signed)
SATURATION QUALIFICATIONS: (This note is used to comply with regulatory documentation for home oxygen)  Patient Saturations on Room Air at Rest =90%  Patient Saturations on Room Air while Ambulating = 86%  Patient Saturations on 2 Liters of oxygen while Ambulating = 92%  Please briefly explain why patient needs home oxygen:

## 2023-08-07 ENCOUNTER — Inpatient Hospital Stay (HOSPITAL_COMMUNITY): Payer: Medicare HMO

## 2023-08-07 DIAGNOSIS — E876 Hypokalemia: Secondary | ICD-10-CM | POA: Diagnosis not present

## 2023-08-07 LAB — BRAIN NATRIURETIC PEPTIDE: B Natriuretic Peptide: 253.9 pg/mL — ABNORMAL HIGH (ref 0.0–100.0)

## 2023-08-07 LAB — CBC WITH DIFFERENTIAL/PLATELET
Abs Immature Granulocytes: 0.02 10*3/uL (ref 0.00–0.07)
Basophils Absolute: 0 10*3/uL (ref 0.0–0.1)
Basophils Relative: 1 %
Eosinophils Absolute: 0.3 10*3/uL (ref 0.0–0.5)
Eosinophils Relative: 4 %
HCT: 36.3 % (ref 36.0–46.0)
Hemoglobin: 11.6 g/dL — ABNORMAL LOW (ref 12.0–15.0)
Immature Granulocytes: 0 %
Lymphocytes Relative: 18 %
Lymphs Abs: 1.4 10*3/uL (ref 0.7–4.0)
MCH: 28.6 pg (ref 26.0–34.0)
MCHC: 32 g/dL (ref 30.0–36.0)
MCV: 89.4 fL (ref 80.0–100.0)
Monocytes Absolute: 0.9 10*3/uL (ref 0.1–1.0)
Monocytes Relative: 11 %
Neutro Abs: 5.1 10*3/uL (ref 1.7–7.7)
Neutrophils Relative %: 66 %
Platelets: 262 10*3/uL (ref 150–400)
RBC: 4.06 MIL/uL (ref 3.87–5.11)
RDW: 13.2 % (ref 11.5–15.5)
WBC: 7.7 10*3/uL (ref 4.0–10.5)
nRBC: 0 % (ref 0.0–0.2)

## 2023-08-07 LAB — COMPREHENSIVE METABOLIC PANEL
ALT: 13 U/L (ref 0–44)
AST: 17 U/L (ref 15–41)
Albumin: 2.2 g/dL — ABNORMAL LOW (ref 3.5–5.0)
Alkaline Phosphatase: 52 U/L (ref 38–126)
Anion gap: 8 (ref 5–15)
BUN: 16 mg/dL (ref 8–23)
CO2: 25 mmol/L (ref 22–32)
Calcium: 7.6 mg/dL — ABNORMAL LOW (ref 8.9–10.3)
Chloride: 100 mmol/L (ref 98–111)
Creatinine, Ser: 0.83 mg/dL (ref 0.44–1.00)
GFR, Estimated: >60 mL/min (ref >60)
Glucose, Bld: 110 mg/dL — ABNORMAL HIGH (ref 70–99)
Potassium: 3.8 mmol/L (ref 3.5–5.1)
Sodium: 133 mmol/L — ABNORMAL LOW (ref 135–145)
Total Bilirubin: 0.4 mg/dL (ref 0.3–1.2)
Total Protein: 6.1 g/dL — ABNORMAL LOW (ref 6.5–8.1)

## 2023-08-07 LAB — GLUCOSE, CAPILLARY
Glucose-Capillary: 88 mg/dL (ref 70–99)
Glucose-Capillary: 135 mg/dL — ABNORMAL HIGH (ref 70–99)
Glucose-Capillary: 139 mg/dL — ABNORMAL HIGH (ref 70–99)
Glucose-Capillary: 152 mg/dL — ABNORMAL HIGH (ref 70–99)

## 2023-08-07 LAB — MAGNESIUM: Magnesium: 2.1 mg/dL (ref 1.7–2.4)

## 2023-08-07 MED ORDER — FUROSEMIDE 10 MG/ML IJ SOLN
60.0000 mg | Freq: Once | INTRAMUSCULAR | Status: AC
  Administered 2023-08-07: 60 mg via INTRAVENOUS
  Filled 2023-08-07: qty 6

## 2023-08-07 MED ORDER — CALCIUM GLUCONATE-NACL 1-0.675 GM/50ML-% IV SOLN
1.0000 g | Freq: Once | INTRAVENOUS | Status: AC
  Administered 2023-08-07: 1000 mg via INTRAVENOUS
  Filled 2023-08-07: qty 50

## 2023-08-07 MED ORDER — SPIRONOLACTONE 25 MG PO TABS
25.0000 mg | ORAL_TABLET | Freq: Once | ORAL | Status: AC
  Administered 2023-08-07: 25 mg via ORAL
  Filled 2023-08-07: qty 1

## 2023-08-07 MED ORDER — BACLOFEN 5 MG HALF TABLET: 5.0000 mg | ORAL_TABLET | Freq: Three times a day (TID) | ORAL | Status: DC | PRN

## 2023-08-07 NOTE — TOC Progression Note (Signed)
Transition of Care [facility]) - Progression Note    Patient Details  Name: Andrea Brooks MRN: [id number] Date of Birth: 1935-11-30  Transition of [facility] CM/SW Contact  Lawerance Sabal, RN Phone Number: 08/07/2023, 4:52 PM  Clinical Narrative:     Ordered home oxygen to be delivered to room Sunday through [facility] in preparation for discharge.        Expected Discharge Plan and Services                         DME Arranged: Oxygen DME Agency: [employer] Date DME Agency Contacted: 08/07/23 Time DME Agency Contacted: [phone] Representative spoke with at DME Agency: Vaughan Basta             Social Determinants of Health (SDOH) Interventions SDOH Screenings   Food Insecurity: No Food Insecurity (08/04/2023)  Housing: Low Risk  (08/04/2023)  Transportation Needs: No Transportation Needs (08/04/2023)  Utilities: Not At Risk (08/04/2023)  Alcohol Screen: Low Risk  (04/08/2023)  Depression (PHQ2-9): Low Risk  (04/08/2023)  Financial Resource Strain: Low Risk  (04/08/2023)  Physical Activity: Inactive (04/08/2023)  Social Connections: Moderately Isolated (04/08/2023)  Stress: No Stress Concern Present (04/08/2023)  Tobacco Use: Medium Risk (07/22/2023)    Readmission Risk Interventions     No data to display

## 2023-08-07 NOTE — Plan of Care (Signed)
  Problem: Education: Goal: Ability to describe self-care measures that may prevent or decrease complications (Diabetes Survival Skills Education) will improve Outcome: Progressing Goal: Individualized Educational Video(s) Outcome: Progressing   Problem: Coping: Goal: Ability to adjust to condition or change in health will improve Outcome: Progressing   Problem: Fluid Volume: Goal: Ability to maintain a balanced intake and output will improve Outcome: Progressing   Problem: Health Behavior/Discharge Planning: Goal: Ability to identify and utilize available resources and services will improve Outcome: Progressing Goal: Ability to manage health-related needs will improve Outcome: Progressing   Problem: Metabolic: Goal: Ability to maintain appropriate glucose levels will improve Outcome: Progressing   Problem: Nutritional: Goal: Maintenance of adequate nutrition will improve Outcome: Progressing Goal: Progress toward achieving an optimal weight will improve Outcome: Progressing   Problem: Skin Integrity: Goal: Risk for impaired skin integrity will decrease Outcome: Progressing   Problem: Tissue Perfusion: Goal: Adequacy of tissue perfusion will improve Outcome: Progressing   Problem: Education: Goal: Knowledge of General Education information will improve Description: Including pain rating scale, medication(s)/side effects and non-pharmacologic comfort measures Outcome: Progressing   Problem: Health Behavior/Discharge Planning: Goal: Ability to manage health-related needs will improve Outcome: Progressing   Problem: Clinical Measurements: Goal: Ability to maintain clinical measurements within normal limits will improve Outcome: Progressing Goal: Will remain free from infection Outcome: Progressing Goal: Diagnostic test results will improve Outcome: Progressing Goal: Respiratory complications will improve Outcome: Progressing Goal: Cardiovascular complication will  be avoided Outcome: Progressing   Problem: Activity: Goal: Risk for activity intolerance will decrease Outcome: Progressing   Problem: Nutrition: Goal: Adequate nutrition will be maintained Outcome: Progressing   Problem: Coping: Goal: Level of anxiety will decrease Outcome: Progressing   Problem: Elimination: Goal: Will not experience complications related to bowel motility Outcome: Progressing Goal: Will not experience complications related to urinary retention Outcome: Progressing   Problem: Pain Managment: Goal: General experience of comfort will improve Outcome: Progressing   Problem: Safety: Goal: Ability to remain free from injury will improve Outcome: Progressing   Problem: Skin Integrity: Goal: Risk for impaired skin integrity will decrease Outcome: Progressing   Problem: Education: Goal: Ability to describe self-care measures that may prevent or decrease complications (Diabetes Survival Skills Education) will improve Outcome: Progressing   Problem: Education: Goal: Individualized Educational Video(s) Outcome: Progressing   Problem: Fluid Volume: Goal: Ability to maintain a balanced intake and output will improve Outcome: Progressing   Problem: Health Behavior/Discharge Planning: Goal: Ability to identify and utilize available resources and services will improve Outcome: Progressing   Problem: Health Behavior/Discharge Planning: Goal: Ability to identify and utilize available resources and services will improve Outcome: Progressing   Problem: Skin Integrity: Goal: Risk for impaired skin integrity will decrease Outcome: Progressing   Problem: Nutritional: Goal: Progress toward achieving an optimal weight will improve Outcome: Progressing   Problem: Nutritional: Goal: Maintenance of adequate nutrition will improve Outcome: Progressing

## 2023-08-07 NOTE — Plan of Care (Signed)
  Problem: Coping: Goal: Ability to adjust to condition or change in health will improve Outcome: Progressing   Problem: Health Behavior/Discharge Planning: Goal: Ability to identify and utilize available resources and services will improve Outcome: Progressing Goal: Ability to manage health-related needs will improve Outcome: Progressing   Problem: Tissue Perfusion: Goal: Adequacy of tissue perfusion will improve Outcome: Progressing   Problem: Nutrition: Goal: Adequate nutrition will be maintained Outcome: Progressing

## 2023-08-07 NOTE — Progress Notes (Signed)
PROGRESS NOTE                                                                                                                                                                                                             Patient Demographics:    Andrea Brooks, is a 87 y.o. female, DOB - [birth year], ZOX:[id number]  Outpatient Primary MD for the patient is Melida Quitter, PA    LOS - 2  Admit date - 08/04/2023    Chief Complaint  Patient presents with   Numbness   Chest Pain   Tingling       Brief Narrative (HPI from H&P)     87 y.o. female with PMH significant for DM2, HTN, HLD, CHF, GERD, anxiety, Per family, patient has a rare condition called pneumatosis cystoides in which she has some 'blisters' in the intestinal wall that gives her intermittent gastroenteritis symptoms.  She follows up at [facility] and is on pyridostigmine bromide daily which slows down her bowel.  To prevent bacterial overgrowth from slow bowel, patient has been taking Flagyl 500 mg daily.   Due to her GI condition at baseline she has poor appetite does not eat well, recently her Flagyl was increased to 500 twice a day but she stopped taking it as she was developing tingling numbness, she presented to the ER as she started having tingling numbness in her extremities and thought this was due to Flagyl, in the ER she was found to have PCM, hypomagnesemia, hypocalcemia and she was admitted for further care.   Subjective:    Andrea Brooks today has, No headache, No chest pain, No abdominal pain - No Nausea, No new weakness tingling or numbness, no SOB   Assessment  & Plan :    Poor oral intake and malabsorption due to underlying GI issues, moderate protein calorie malnutrition, hypomagnesemia, hypokalemia, severe hypocalcemia causing tingling numbness. She has been placed on oral protein supplements, replace magnesium and calcium, monitor electrolytes.   Advance activity.  PT OT.   Possible right lower lobe pneumonia upon admission.  Placed on empiric IV antibiotics with improvement.  SLP eval.  Continue to monitor.  Acute on Chr. dCHF EF 60% - lasix, B Blcoker.  Prolonged QTc.  Due to multiple electrolyte abnormalities.  Resolved after electrolytes replaced on EKG of 08/06/2023.  Pneumatosis cystoides/  Recent Labs  Lab 08/04/23 1148 08/04/23 1515 08/05/23 0354 08/06/23 1313 08/07/23 0515  PROCALCITON  --  0.76  --   --   --   HGBA1C  --  5.6  --   --   --   BNP  --   --   --   735.7* 253.9*  MG  --   --  1.7 1.7 2.1  CALCIUM 5.9*  --  6.3* 8.0* 7.6*    Lab Results  Component Value Date   HGBA1C 5.6 08/04/2023   Radiology Reports DG Chest Port 1 View  Result Date: 08/07/2023 CLINICAL DATA:  Shortness of breath. EXAM: PORTABLE CHEST 1 VIEW COMPARISON:  08/04/2023 FINDINGS: Upper normal to mild cardiomegaly. Underlying chronic interstitial lung disease again noted. Persistent right base collapse/consolidation with small effusion. Bones are diffusely demineralized. Telemetry leads overlie the chest. IMPRESSION: Persistent right base collapse/consolidation with small effusion. Electronically Signed   By: Kennith Center M.D.   On: 08/07/2023 08:09   ECHOCARDIOGRAM COMPLETE  Result Date: 08/05/2023    ECHOCARDIOGRAM REPORT   Patient Name:   Andrea Brooks Date of Exam: 08/05/2023 Medical Rec #:  [id number]      Height:       63.0 in Accession #:    [id number]     Weight:       136.9 lb Date of Birth:  18-Nov-1936       BSA:          1.646 m Patient Age:    87 years       BP:           161/68 mmHg Patient Gender: F              HR:           75 bpm. Exam Location:  Inpatient Procedure: 2D Echo, Cardiac Doppler and Color Doppler Indications:    Abnormal ECG 794.31 / R94.31                 Elevated Troponin  History:        Patient has prior history of Echocardiogram examinations, most                 recent 07/03/2018. CHF, Previous Myocardial Infarction; Risk                 Factors:Diabetes, Dyslipidemia and Former Smoker.  Sonographer:    Dondra Prader RVT Referring Phys: [id number] [facility] IMPRESSIONS  1. Left ventricular ejection fraction, by estimation, is 60 to 65%. The left ventricle has normal function. The left ventricle has no regional wall motion abnormalities. There is mild concentric left ventricular hypertrophy. Left ventricular diastolic parameters are consistent with Grade II diastolic dysfunction (pseudonormalization).  2. Right ventricular systolic function is normal.  The right ventricular size is normal.  3. Left atrial size was moderately dilated.  4. The mitral valve is normal in structure. Mild mitral valve regurgitation. No evidence of mitral stenosis.  5. The aortic valve is tricuspid. There is mild calcification of the aortic valve. Aortic valve regurgitation is not visualized. Aortic valve sclerosis/calcification is present, without any evidence of aortic stenosis.  6. Aortic dilatation noted. There is mild dilatation of the ascending aorta, measuring 42 mm.  7. The inferior vena cava is normal in size with greater than 50% respiratory variability, suggesting right atrial pressure of 3 mmHg. FINDINGS  Left Ventricle: Left ventricular ejection fraction, by estimation, is 60 to 65%. The left  Recent Labs  Lab 08/04/23 1148 08/04/23 1515 08/05/23 0354 08/06/23 1313 08/07/23 0515  PROCALCITON  --  0.76  --   --   --   HGBA1C  --  5.6  --   --   --   BNP  --   --   --   735.7* 253.9*  MG  --   --  1.7 1.7 2.1  CALCIUM 5.9*  --  6.3* 8.0* 7.6*    Lab Results  Component Value Date   HGBA1C 5.6 08/04/2023   Radiology Reports DG Chest Port 1 View  Result Date: 08/07/2023 CLINICAL DATA:  Shortness of breath. EXAM: PORTABLE CHEST 1 VIEW COMPARISON:  08/04/2023 FINDINGS: Upper normal to mild cardiomegaly. Underlying chronic interstitial lung disease again noted. Persistent right base collapse/consolidation with small effusion. Bones are diffusely demineralized. Telemetry leads overlie the chest. IMPRESSION: Persistent right base collapse/consolidation with small effusion. Electronically Signed   By: Kennith Center M.D.   On: 08/07/2023 08:09   ECHOCARDIOGRAM COMPLETE  Result Date: 08/05/2023    ECHOCARDIOGRAM REPORT   Patient Name:   Andrea Brooks Date of Exam: 08/05/2023 Medical Rec #:  [id number]      Height:       63.0 in Accession #:    [id number]     Weight:       136.9 lb Date of Birth:  18-Nov-1936       BSA:          1.646 m Patient Age:    87 years       BP:           161/68 mmHg Patient Gender: F              HR:           75 bpm. Exam Location:  Inpatient Procedure: 2D Echo, Cardiac Doppler and Color Doppler Indications:    Abnormal ECG 794.31 / R94.31                 Elevated Troponin  History:        Patient has prior history of Echocardiogram examinations, most                 recent 07/03/2018. CHF, Previous Myocardial Infarction; Risk                 Factors:Diabetes, Dyslipidemia and Former Smoker.  Sonographer:    Dondra Prader RVT Referring Phys: [id number] [facility] IMPRESSIONS  1. Left ventricular ejection fraction, by estimation, is 60 to 65%. The left ventricle has normal function. The left ventricle has no regional wall motion abnormalities. There is mild concentric left ventricular hypertrophy. Left ventricular diastolic parameters are consistent with Grade II diastolic dysfunction (pseudonormalization).  2. Right ventricular systolic function is normal.  The right ventricular size is normal.  3. Left atrial size was moderately dilated.  4. The mitral valve is normal in structure. Mild mitral valve regurgitation. No evidence of mitral stenosis.  5. The aortic valve is tricuspid. There is mild calcification of the aortic valve. Aortic valve regurgitation is not visualized. Aortic valve sclerosis/calcification is present, without any evidence of aortic stenosis.  6. Aortic dilatation noted. There is mild dilatation of the ascending aorta, measuring 42 mm.  7. The inferior vena cava is normal in size with greater than 50% respiratory variability, suggesting right atrial pressure of 3 mmHg. FINDINGS  Left Ventricle: Left ventricular ejection fraction, by estimation, is 60 to 65%. The left  PROGRESS NOTE                                                                                                                                                                                                             Patient Demographics:    Andrea Brooks, is a 87 y.o. female, DOB - [birth year], ZOX:[id number]  Outpatient Primary MD for the patient is Melida Quitter, PA    LOS - 2  Admit date - 08/04/2023    Chief Complaint  Patient presents with   Numbness   Chest Pain   Tingling       Brief Narrative (HPI from H&P)     87 y.o. female with PMH significant for DM2, HTN, HLD, CHF, GERD, anxiety, Per family, patient has a rare condition called pneumatosis cystoides in which she has some 'blisters' in the intestinal wall that gives her intermittent gastroenteritis symptoms.  She follows up at [facility] and is on pyridostigmine bromide daily which slows down her bowel.  To prevent bacterial overgrowth from slow bowel, patient has been taking Flagyl 500 mg daily.   Due to her GI condition at baseline she has poor appetite does not eat well, recently her Flagyl was increased to 500 twice a day but she stopped taking it as she was developing tingling numbness, she presented to the ER as she started having tingling numbness in her extremities and thought this was due to Flagyl, in the ER she was found to have PCM, hypomagnesemia, hypocalcemia and she was admitted for further care.   Subjective:    Andrea Brooks today has, No headache, No chest pain, No abdominal pain - No Nausea, No new weakness tingling or numbness, no SOB   Assessment  & Plan :    Poor oral intake and malabsorption due to underlying GI issues, moderate protein calorie malnutrition, hypomagnesemia, hypokalemia, severe hypocalcemia causing tingling numbness. She has been placed on oral protein supplements, replace magnesium and calcium, monitor electrolytes.   Advance activity.  PT OT.   Possible right lower lobe pneumonia upon admission.  Placed on empiric IV antibiotics with improvement.  SLP eval.  Continue to monitor.  Acute on Chr. dCHF EF 60% - lasix, B Blcoker.  Prolonged QTc.  Due to multiple electrolyte abnormalities.  Resolved after electrolytes replaced on EKG of 08/06/2023.  Pneumatosis cystoides/  PROGRESS NOTE                                                                                                                                                                                                             Patient Demographics:    Andrea Brooks, is a 87 y.o. female, DOB - [birth year], ZOX:[id number]  Outpatient Primary MD for the patient is Melida Quitter, PA    LOS - 2  Admit date - 08/04/2023    Chief Complaint  Patient presents with   Numbness   Chest Pain   Tingling       Brief Narrative (HPI from H&P)     87 y.o. female with PMH significant for DM2, HTN, HLD, CHF, GERD, anxiety, Per family, patient has a rare condition called pneumatosis cystoides in which she has some 'blisters' in the intestinal wall that gives her intermittent gastroenteritis symptoms.  She follows up at [facility] and is on pyridostigmine bromide daily which slows down her bowel.  To prevent bacterial overgrowth from slow bowel, patient has been taking Flagyl 500 mg daily.   Due to her GI condition at baseline she has poor appetite does not eat well, recently her Flagyl was increased to 500 twice a day but she stopped taking it as she was developing tingling numbness, she presented to the ER as she started having tingling numbness in her extremities and thought this was due to Flagyl, in the ER she was found to have PCM, hypomagnesemia, hypocalcemia and she was admitted for further care.   Subjective:    Andrea Brooks today has, No headache, No chest pain, No abdominal pain - No Nausea, No new weakness tingling or numbness, no SOB   Assessment  & Plan :    Poor oral intake and malabsorption due to underlying GI issues, moderate protein calorie malnutrition, hypomagnesemia, hypokalemia, severe hypocalcemia causing tingling numbness. She has been placed on oral protein supplements, replace magnesium and calcium, monitor electrolytes.   Advance activity.  PT OT.   Possible right lower lobe pneumonia upon admission.  Placed on empiric IV antibiotics with improvement.  SLP eval.  Continue to monitor.  Acute on Chr. dCHF EF 60% - lasix, B Blcoker.  Prolonged QTc.  Due to multiple electrolyte abnormalities.  Resolved after electrolytes replaced on EKG of 08/06/2023.  Pneumatosis cystoides/  Recent Labs  Lab 08/04/23 1148 08/04/23 1515 08/05/23 0354 08/06/23 1313 08/07/23 0515  PROCALCITON  --  0.76  --   --   --   HGBA1C  --  5.6  --   --   --   BNP  --   --   --   735.7* 253.9*  MG  --   --  1.7 1.7 2.1  CALCIUM 5.9*  --  6.3* 8.0* 7.6*    Lab Results  Component Value Date   HGBA1C 5.6 08/04/2023   Radiology Reports DG Chest Port 1 View  Result Date: 08/07/2023 CLINICAL DATA:  Shortness of breath. EXAM: PORTABLE CHEST 1 VIEW COMPARISON:  08/04/2023 FINDINGS: Upper normal to mild cardiomegaly. Underlying chronic interstitial lung disease again noted. Persistent right base collapse/consolidation with small effusion. Bones are diffusely demineralized. Telemetry leads overlie the chest. IMPRESSION: Persistent right base collapse/consolidation with small effusion. Electronically Signed   By: Kennith Center M.D.   On: 08/07/2023 08:09   ECHOCARDIOGRAM COMPLETE  Result Date: 08/05/2023    ECHOCARDIOGRAM REPORT   Patient Name:   Andrea Brooks Date of Exam: 08/05/2023 Medical Rec #:  [id number]      Height:       63.0 in Accession #:    [id number]     Weight:       136.9 lb Date of Birth:  18-Nov-1936       BSA:          1.646 m Patient Age:    87 years       BP:           161/68 mmHg Patient Gender: F              HR:           75 bpm. Exam Location:  Inpatient Procedure: 2D Echo, Cardiac Doppler and Color Doppler Indications:    Abnormal ECG 794.31 / R94.31                 Elevated Troponin  History:        Patient has prior history of Echocardiogram examinations, most                 recent 07/03/2018. CHF, Previous Myocardial Infarction; Risk                 Factors:Diabetes, Dyslipidemia and Former Smoker.  Sonographer:    Dondra Prader RVT Referring Phys: [id number] [facility] IMPRESSIONS  1. Left ventricular ejection fraction, by estimation, is 60 to 65%. The left ventricle has normal function. The left ventricle has no regional wall motion abnormalities. There is mild concentric left ventricular hypertrophy. Left ventricular diastolic parameters are consistent with Grade II diastolic dysfunction (pseudonormalization).  2. Right ventricular systolic function is normal.  The right ventricular size is normal.  3. Left atrial size was moderately dilated.  4. The mitral valve is normal in structure. Mild mitral valve regurgitation. No evidence of mitral stenosis.  5. The aortic valve is tricuspid. There is mild calcification of the aortic valve. Aortic valve regurgitation is not visualized. Aortic valve sclerosis/calcification is present, without any evidence of aortic stenosis.  6. Aortic dilatation noted. There is mild dilatation of the ascending aorta, measuring 42 mm.  7. The inferior vena cava is normal in size with greater than 50% respiratory variability, suggesting right atrial pressure of 3 mmHg. FINDINGS  Left Ventricle: Left ventricular ejection fraction, by estimation, is 60 to 65%. The left

## 2023-08-08 DIAGNOSIS — E876 Hypokalemia: Secondary | ICD-10-CM | POA: Diagnosis not present

## 2023-08-08 LAB — COMPREHENSIVE METABOLIC PANEL
ALT: 14 U/L (ref 0–44)
AST: 20 U/L (ref 15–41)
Albumin: 2.4 g/dL — ABNORMAL LOW (ref 3.5–5.0)
Alkaline Phosphatase: 48 U/L (ref 38–126)
Anion gap: 13 (ref 5–15)
BUN: 20 mg/dL (ref 8–23)
CO2: 22 mmol/L (ref 22–32)
Calcium: 8.3 mg/dL — ABNORMAL LOW (ref 8.9–10.3)
Chloride: 100 mmol/L (ref 98–111)
Creatinine, Ser: 0.71 mg/dL (ref 0.44–1.00)
GFR, Estimated: >60 mL/min (ref >60)
Glucose, Bld: 143 mg/dL — ABNORMAL HIGH (ref 70–99)
Potassium: 3.9 mmol/L (ref 3.5–5.1)
Sodium: 135 mmol/L (ref 135–145)
Total Bilirubin: 0.3 mg/dL (ref 0.3–1.2)
Total Protein: 6.5 g/dL (ref 6.5–8.1)

## 2023-08-08 LAB — CBC WITH DIFFERENTIAL/PLATELET
Abs Immature Granulocytes: 0.03 10*3/uL (ref 0.00–0.07)
Basophils Absolute: 0 10*3/uL (ref 0.0–0.1)
Basophils Relative: 1 %
Eosinophils Absolute: 0.3 10*3/uL (ref 0.0–0.5)
Eosinophils Relative: 4 %
HCT: 37 % (ref 36.0–46.0)
Hemoglobin: 12 g/dL (ref 12.0–15.0)
Immature Granulocytes: 0 %
Lymphocytes Relative: 14 %
Lymphs Abs: 1.1 10*3/uL (ref 0.7–4.0)
MCH: 29.9 pg (ref 26.0–34.0)
MCHC: 32.4 g/dL (ref 30.0–36.0)
MCV: 92 fL (ref 80.0–100.0)
Monocytes Absolute: 0.8 10*3/uL (ref 0.1–1.0)
Monocytes Relative: 10 %
Neutro Abs: 5.4 10*3/uL (ref 1.7–7.7)
Neutrophils Relative %: 71 %
Platelets: 273 10*3/uL (ref 150–400)
RBC: 4.02 MIL/uL (ref 3.87–5.11)
RDW: 13.2 % (ref 11.5–15.5)
WBC: 7.6 10*3/uL (ref 4.0–10.5)
nRBC: 0 % (ref 0.0–0.2)

## 2023-08-08 LAB — BRAIN NATRIURETIC PEPTIDE: B Natriuretic Peptide: 151.9 pg/mL — ABNORMAL HIGH (ref 0.0–100.0)

## 2023-08-08 LAB — GLUCOSE, CAPILLARY
Glucose-Capillary: 101 mg/dL — ABNORMAL HIGH (ref 70–99)
Glucose-Capillary: 131 mg/dL — ABNORMAL HIGH (ref 70–99)
Glucose-Capillary: 125 mg/dL — ABNORMAL HIGH (ref 70–99)
Glucose-Capillary: 123 mg/dL — ABNORMAL HIGH (ref 70–99)

## 2023-08-08 LAB — MAGNESIUM: Magnesium: 1.7 mg/dL (ref 1.7–2.4)

## 2023-08-08 MED ORDER — FUROSEMIDE 10 MG/ML IJ SOLN
60.0000 mg | Freq: Once | INTRAMUSCULAR | Status: AC
  Administered 2023-08-08: 60 mg via INTRAVENOUS
  Filled 2023-08-08: qty 6

## 2023-08-08 MED ORDER — MAGNESIUM SULFATE IN D5W 1-5 GM/100ML-% IV SOLN
1.0000 g | Freq: Once | INTRAVENOUS | Status: AC
  Administered 2023-08-08: 1 g via INTRAVENOUS
  Filled 2023-08-08: qty 100

## 2023-08-08 MED ORDER — POTASSIUM CHLORIDE CRYS ER 20 MEQ PO TBCR
40.0000 meq | EXTENDED_RELEASE_TABLET | Freq: Once | ORAL | Status: AC
  Administered 2023-08-08: 40 meq via ORAL
  Filled 2023-08-08: qty 2

## 2023-08-08 NOTE — Progress Notes (Signed)
Recent Labs  Lab 08/04/23 1148 08/04/23 1515 08/05/23 0354 08/06/23 1313 08/07/23 0515   PROCALCITON  --  0.76  --   --   --   HGBA1C  --  5.6  --   --   --   BNP  --   --   --  735.7* 253.9*  MG  --   --  1.7 1.7 2.1  CALCIUM 5.9*  --  6.3* 8.0* 7.6*    Lab Results  Component Value Date   HGBA1C 5.6 08/04/2023   Radiology Reports DG Chest Port 1 View  Result Date: 08/07/2023 CLINICAL DATA:  Shortness of breath. EXAM: PORTABLE CHEST 1 VIEW COMPARISON:  08/04/2023 FINDINGS: Upper normal to mild cardiomegaly. Underlying chronic interstitial lung disease again noted. Persistent right base collapse/consolidation with small effusion. Bones are diffusely demineralized. Telemetry leads overlie the chest. IMPRESSION: Persistent right base collapse/consolidation with small effusion. Electronically Signed   By: Kennith Center M.D.   On: 08/07/2023 08:09   ECHOCARDIOGRAM COMPLETE  Result Date: 08/05/2023    ECHOCARDIOGRAM REPORT   Patient Name:   Andrea Brooks Date of Exam: 08/05/2023 Medical Rec #:  [id number]      Height:       63.0 in Accession #:    [id number]     Weight:       136.9 lb Date of Birth:  1936-09-24       BSA:          1.646 m Patient Age:    87 years       BP:           161/68 mmHg Patient Gender: F              HR:           75 bpm. Exam Location:  Inpatient Procedure: 2D Echo, Cardiac Doppler and Color Doppler Indications:    Abnormal ECG 794.31 / R94.31                 Elevated Troponin  History:        Patient has prior history of Echocardiogram examinations, most                 recent 07/03/2018. CHF, Previous Myocardial Infarction; Risk                 Factors:Diabetes, Dyslipidemia and Former Smoker.  Sonographer:    Dondra Prader RVT Referring Phys: [id number] [facility] IMPRESSIONS  1. Left ventricular ejection fraction, by estimation, is 60 to 65%. The left ventricle has normal function. The left ventricle has no regional wall motion abnormalities. There is mild concentric left ventricular hypertrophy. Left ventricular diastolic parameters are consistent with Grade II  diastolic dysfunction (pseudonormalization).  2. Right ventricular systolic function is normal. The right ventricular size is normal.  3. Left atrial size was moderately dilated.  4. The mitral valve is normal in structure. Mild mitral valve regurgitation. No evidence of mitral stenosis.  5. The aortic valve is tricuspid. There is mild calcification of the aortic valve. Aortic valve regurgitation is not visualized. Aortic valve sclerosis/calcification is present, without any evidence of aortic stenosis.  6. Aortic dilatation noted. There is mild dilatation of the ascending aorta, measuring 42 mm.  7. The inferior vena cava is normal in size with greater than 50% respiratory variability, suggesting right atrial pressure of 3 mmHg. FINDINGS  Left Ventricle: Left ventricular ejection fraction, by estimation, is 60 to 65%. The left  Recent Labs  Lab 08/04/23 1148 08/04/23 1515 08/05/23 0354 08/06/23 1313 08/07/23 0515   PROCALCITON  --  0.76  --   --   --   HGBA1C  --  5.6  --   --   --   BNP  --   --   --  735.7* 253.9*  MG  --   --  1.7 1.7 2.1  CALCIUM 5.9*  --  6.3* 8.0* 7.6*    Lab Results  Component Value Date   HGBA1C 5.6 08/04/2023   Radiology Reports DG Chest Port 1 View  Result Date: 08/07/2023 CLINICAL DATA:  Shortness of breath. EXAM: PORTABLE CHEST 1 VIEW COMPARISON:  08/04/2023 FINDINGS: Upper normal to mild cardiomegaly. Underlying chronic interstitial lung disease again noted. Persistent right base collapse/consolidation with small effusion. Bones are diffusely demineralized. Telemetry leads overlie the chest. IMPRESSION: Persistent right base collapse/consolidation with small effusion. Electronically Signed   By: Kennith Center M.D.   On: 08/07/2023 08:09   ECHOCARDIOGRAM COMPLETE  Result Date: 08/05/2023    ECHOCARDIOGRAM REPORT   Patient Name:   Andrea Brooks Date of Exam: 08/05/2023 Medical Rec #:  [id number]      Height:       63.0 in Accession #:    [id number]     Weight:       136.9 lb Date of Birth:  1936-09-24       BSA:          1.646 m Patient Age:    87 years       BP:           161/68 mmHg Patient Gender: F              HR:           75 bpm. Exam Location:  Inpatient Procedure: 2D Echo, Cardiac Doppler and Color Doppler Indications:    Abnormal ECG 794.31 / R94.31                 Elevated Troponin  History:        Patient has prior history of Echocardiogram examinations, most                 recent 07/03/2018. CHF, Previous Myocardial Infarction; Risk                 Factors:Diabetes, Dyslipidemia and Former Smoker.  Sonographer:    Dondra Prader RVT Referring Phys: [id number] [facility] IMPRESSIONS  1. Left ventricular ejection fraction, by estimation, is 60 to 65%. The left ventricle has normal function. The left ventricle has no regional wall motion abnormalities. There is mild concentric left ventricular hypertrophy. Left ventricular diastolic parameters are consistent with Grade II  diastolic dysfunction (pseudonormalization).  2. Right ventricular systolic function is normal. The right ventricular size is normal.  3. Left atrial size was moderately dilated.  4. The mitral valve is normal in structure. Mild mitral valve regurgitation. No evidence of mitral stenosis.  5. The aortic valve is tricuspid. There is mild calcification of the aortic valve. Aortic valve regurgitation is not visualized. Aortic valve sclerosis/calcification is present, without any evidence of aortic stenosis.  6. Aortic dilatation noted. There is mild dilatation of the ascending aorta, measuring 42 mm.  7. The inferior vena cava is normal in size with greater than 50% respiratory variability, suggesting right atrial pressure of 3 mmHg. FINDINGS  Left Ventricle: Left ventricular ejection fraction, by estimation, is 60 to 65%. The left  Recent Labs  Lab 08/04/23 1148 08/04/23 1515 08/05/23 0354 08/06/23 1313 08/07/23 0515   PROCALCITON  --  0.76  --   --   --   HGBA1C  --  5.6  --   --   --   BNP  --   --   --  735.7* 253.9*  MG  --   --  1.7 1.7 2.1  CALCIUM 5.9*  --  6.3* 8.0* 7.6*    Lab Results  Component Value Date   HGBA1C 5.6 08/04/2023   Radiology Reports DG Chest Port 1 View  Result Date: 08/07/2023 CLINICAL DATA:  Shortness of breath. EXAM: PORTABLE CHEST 1 VIEW COMPARISON:  08/04/2023 FINDINGS: Upper normal to mild cardiomegaly. Underlying chronic interstitial lung disease again noted. Persistent right base collapse/consolidation with small effusion. Bones are diffusely demineralized. Telemetry leads overlie the chest. IMPRESSION: Persistent right base collapse/consolidation with small effusion. Electronically Signed   By: Kennith Center M.D.   On: 08/07/2023 08:09   ECHOCARDIOGRAM COMPLETE  Result Date: 08/05/2023    ECHOCARDIOGRAM REPORT   Patient Name:   Andrea Brooks Date of Exam: 08/05/2023 Medical Rec #:  [id number]      Height:       63.0 in Accession #:    [id number]     Weight:       136.9 lb Date of Birth:  1936-09-24       BSA:          1.646 m Patient Age:    87 years       BP:           161/68 mmHg Patient Gender: F              HR:           75 bpm. Exam Location:  Inpatient Procedure: 2D Echo, Cardiac Doppler and Color Doppler Indications:    Abnormal ECG 794.31 / R94.31                 Elevated Troponin  History:        Patient has prior history of Echocardiogram examinations, most                 recent 07/03/2018. CHF, Previous Myocardial Infarction; Risk                 Factors:Diabetes, Dyslipidemia and Former Smoker.  Sonographer:    Dondra Prader RVT Referring Phys: [id number] [facility] IMPRESSIONS  1. Left ventricular ejection fraction, by estimation, is 60 to 65%. The left ventricle has normal function. The left ventricle has no regional wall motion abnormalities. There is mild concentric left ventricular hypertrophy. Left ventricular diastolic parameters are consistent with Grade II  diastolic dysfunction (pseudonormalization).  2. Right ventricular systolic function is normal. The right ventricular size is normal.  3. Left atrial size was moderately dilated.  4. The mitral valve is normal in structure. Mild mitral valve regurgitation. No evidence of mitral stenosis.  5. The aortic valve is tricuspid. There is mild calcification of the aortic valve. Aortic valve regurgitation is not visualized. Aortic valve sclerosis/calcification is present, without any evidence of aortic stenosis.  6. Aortic dilatation noted. There is mild dilatation of the ascending aorta, measuring 42 mm.  7. The inferior vena cava is normal in size with greater than 50% respiratory variability, suggesting right atrial pressure of 3 mmHg. FINDINGS  Left Ventricle: Left ventricular ejection fraction, by estimation, is 60 to 65%. The left  Recent Labs  Lab 08/04/23 1148 08/04/23 1515 08/05/23 0354 08/06/23 1313 08/07/23 0515   PROCALCITON  --  0.76  --   --   --   HGBA1C  --  5.6  --   --   --   BNP  --   --   --  735.7* 253.9*  MG  --   --  1.7 1.7 2.1  CALCIUM 5.9*  --  6.3* 8.0* 7.6*    Lab Results  Component Value Date   HGBA1C 5.6 08/04/2023   Radiology Reports DG Chest Port 1 View  Result Date: 08/07/2023 CLINICAL DATA:  Shortness of breath. EXAM: PORTABLE CHEST 1 VIEW COMPARISON:  08/04/2023 FINDINGS: Upper normal to mild cardiomegaly. Underlying chronic interstitial lung disease again noted. Persistent right base collapse/consolidation with small effusion. Bones are diffusely demineralized. Telemetry leads overlie the chest. IMPRESSION: Persistent right base collapse/consolidation with small effusion. Electronically Signed   By: Kennith Center M.D.   On: 08/07/2023 08:09   ECHOCARDIOGRAM COMPLETE  Result Date: 08/05/2023    ECHOCARDIOGRAM REPORT   Patient Name:   Andrea Brooks Date of Exam: 08/05/2023 Medical Rec #:  [id number]      Height:       63.0 in Accession #:    [id number]     Weight:       136.9 lb Date of Birth:  1936-09-24       BSA:          1.646 m Patient Age:    87 years       BP:           161/68 mmHg Patient Gender: F              HR:           75 bpm. Exam Location:  Inpatient Procedure: 2D Echo, Cardiac Doppler and Color Doppler Indications:    Abnormal ECG 794.31 / R94.31                 Elevated Troponin  History:        Patient has prior history of Echocardiogram examinations, most                 recent 07/03/2018. CHF, Previous Myocardial Infarction; Risk                 Factors:Diabetes, Dyslipidemia and Former Smoker.  Sonographer:    Dondra Prader RVT Referring Phys: [id number] [facility] IMPRESSIONS  1. Left ventricular ejection fraction, by estimation, is 60 to 65%. The left ventricle has normal function. The left ventricle has no regional wall motion abnormalities. There is mild concentric left ventricular hypertrophy. Left ventricular diastolic parameters are consistent with Grade II  diastolic dysfunction (pseudonormalization).  2. Right ventricular systolic function is normal. The right ventricular size is normal.  3. Left atrial size was moderately dilated.  4. The mitral valve is normal in structure. Mild mitral valve regurgitation. No evidence of mitral stenosis.  5. The aortic valve is tricuspid. There is mild calcification of the aortic valve. Aortic valve regurgitation is not visualized. Aortic valve sclerosis/calcification is present, without any evidence of aortic stenosis.  6. Aortic dilatation noted. There is mild dilatation of the ascending aorta, measuring 42 mm.  7. The inferior vena cava is normal in size with greater than 50% respiratory variability, suggesting right atrial pressure of 3 mmHg. FINDINGS  Left Ventricle: Left ventricular ejection fraction, by estimation, is 60 to 65%. The left  PROGRESS NOTE                                                                                                                                                                                                             Patient Demographics:    Andrea Brooks, is a 87 y.o. female, DOB - 1936-06-10, WUJ:[id number]  Outpatient Primary MD for the patient is Melida Quitter, PA    LOS - 3  Admit date - 08/04/2023    Chief Complaint  Patient presents with   Numbness   Chest Pain   Tingling       Brief Narrative (HPI from H&P)     87 y.o. female with PMH significant for DM2, HTN, HLD, CHF, GERD, anxiety, Per family, patient has a rare condition called pneumatosis cystoides in which she has some 'blisters' in the intestinal wall that gives her intermittent gastroenteritis symptoms.  She follows up at [facility] and is on pyridostigmine bromide daily which slows down her bowel.  To prevent bacterial overgrowth from slow bowel, patient has been taking Flagyl 500 mg daily.   Due to her GI condition at baseline she has poor appetite does not eat well, recently her Flagyl was increased to 500 twice a day but she stopped taking it as she was developing tingling numbness, she presented to the ER as she started having tingling numbness in her extremities and thought this was due to Flagyl, in the ER she was found to have PCM, hypomagnesemia, hypocalcemia and she was admitted for further care.   Subjective:   Patient in bed, appears comfortable, denies any headache, no fever, no chest pain or pressure, proving orthopnea and shortness of breath , no abdominal pain. No focal weakness.   Assessment  & Plan :    Poor oral intake and malabsorption due to underlying GI issues, moderate protein calorie malnutrition, hypomagnesemia, hypokalemia, severe hypocalcemia causing tingling numbness. She has been placed on oral protein supplements, replace  magnesium and calcium, monitor electrolytes.  Advance activity.  PT OT.   Possible right lower lobe pneumonia upon admission.  Placed on empiric IV antibiotics with improvement.  SLP eval.  Continue to monitor.  Prolonged QTc.  Due to multiple electrolyte abnormalities.  Resolved after electrolytes replaced on EKG of 08/06/2023.  Pneumatosis cystoides/ SIBO - Resume Mestinon and Flagyl.  PROGRESS NOTE                                                                                                                                                                                                             Patient Demographics:    Andrea Brooks, is a 87 y.o. female, DOB - 1936-06-10, WUJ:[id number]  Outpatient Primary MD for the patient is Melida Quitter, PA    LOS - 3  Admit date - 08/04/2023    Chief Complaint  Patient presents with   Numbness   Chest Pain   Tingling       Brief Narrative (HPI from H&P)     87 y.o. female with PMH significant for DM2, HTN, HLD, CHF, GERD, anxiety, Per family, patient has a rare condition called pneumatosis cystoides in which she has some 'blisters' in the intestinal wall that gives her intermittent gastroenteritis symptoms.  She follows up at [facility] and is on pyridostigmine bromide daily which slows down her bowel.  To prevent bacterial overgrowth from slow bowel, patient has been taking Flagyl 500 mg daily.   Due to her GI condition at baseline she has poor appetite does not eat well, recently her Flagyl was increased to 500 twice a day but she stopped taking it as she was developing tingling numbness, she presented to the ER as she started having tingling numbness in her extremities and thought this was due to Flagyl, in the ER she was found to have PCM, hypomagnesemia, hypocalcemia and she was admitted for further care.   Subjective:   Patient in bed, appears comfortable, denies any headache, no fever, no chest pain or pressure, proving orthopnea and shortness of breath , no abdominal pain. No focal weakness.   Assessment  & Plan :    Poor oral intake and malabsorption due to underlying GI issues, moderate protein calorie malnutrition, hypomagnesemia, hypokalemia, severe hypocalcemia causing tingling numbness. She has been placed on oral protein supplements, replace  magnesium and calcium, monitor electrolytes.  Advance activity.  PT OT.   Possible right lower lobe pneumonia upon admission.  Placed on empiric IV antibiotics with improvement.  SLP eval.  Continue to monitor.  Prolonged QTc.  Due to multiple electrolyte abnormalities.  Resolved after electrolytes replaced on EKG of 08/06/2023.  Pneumatosis cystoides/ SIBO - Resume Mestinon and Flagyl.

## 2023-08-08 NOTE — Plan of Care (Signed)
  Problem: Coping: Goal: Ability to adjust to condition or change in health will improve Outcome: Progressing   Problem: Health Behavior/Discharge Planning: Goal: Ability to manage health-related needs will improve Outcome: Progressing   Problem: Metabolic: Goal: Ability to maintain appropriate glucose levels will improve Outcome: Progressing   Problem: Clinical Measurements: Goal: Respiratory complications will improve Outcome: Progressing   Problem: Nutrition: Goal: Adequate nutrition will be maintained Outcome: Progressing

## 2023-08-08 NOTE — Plan of Care (Signed)
Pt has rested quietly throughout the night with no distress noted. Alert and oriented. On 1LNC. No SOA noted. Up to BR with assist. No complaints voiced.     Problem: Education: Goal: Ability to describe self-care measures that may prevent or decrease complications (Diabetes Survival Skills Education) will improve Outcome: Progressing Goal: Individualized Educational Video(s) Outcome: Progressing   Problem: Coping: Goal: Ability to adjust to condition or change in health will improve Outcome: Progressing   Problem: Fluid Volume: Goal: Ability to maintain a balanced intake and output will improve Outcome: Progressing   Problem: Health Behavior/Discharge Planning: Goal: Ability to identify and utilize available resources and services will improve Outcome: Progressing Goal: Ability to manage health-related needs will improve Outcome: Progressing   Problem: Metabolic: Goal: Ability to maintain appropriate glucose levels will improve Outcome: Progressing   Problem: Nutritional: Goal: Maintenance of adequate nutrition will improve Outcome: Progressing Goal: Progress toward achieving an optimal weight will improve Outcome: Progressing   Problem: Skin Integrity: Goal: Risk for impaired skin integrity will decrease Outcome: Progressing   Problem: Tissue Perfusion: Goal: Adequacy of tissue perfusion will improve Outcome: Progressing   Problem: Education: Goal: Knowledge of General Education information will improve Description: Including pain rating scale, medication(s)/side effects and non-pharmacologic comfort measures Outcome: Progressing   Problem: Health Behavior/Discharge Planning: Goal: Ability to manage health-related needs will improve Outcome: Progressing   Problem: Clinical Measurements: Goal: Ability to maintain clinical measurements within normal limits will improve Outcome: Progressing Goal: Will remain free from infection Outcome: Progressing Goal:  Diagnostic test results will improve Outcome: Progressing Goal: Respiratory complications will improve Outcome: Progressing Goal: Cardiovascular complication will be avoided Outcome: Progressing   Problem: Activity: Goal: Risk for activity intolerance will decrease Outcome: Progressing   Problem: Nutrition: Goal: Adequate nutrition will be maintained Outcome: Progressing   Problem: Coping: Goal: Level of anxiety will decrease Outcome: Progressing   Problem: Elimination: Goal: Will not experience complications related to bowel motility Outcome: Progressing Goal: Will not experience complications related to urinary retention Outcome: Progressing   Problem: Pain Managment: Goal: General experience of comfort will improve Outcome: Progressing   Problem: Safety: Goal: Ability to remain free from injury will improve Outcome: Progressing   Problem: Skin Integrity: Goal: Risk for impaired skin integrity will decrease Outcome: Progressing

## 2023-08-09 ENCOUNTER — Ambulatory Visit: Payer: Medicare HMO | Admitting: Student

## 2023-08-09 ENCOUNTER — Other Ambulatory Visit (HOSPITAL_COMMUNITY): Payer: Self-pay

## 2023-08-09 DIAGNOSIS — E876 Hypokalemia: Secondary | ICD-10-CM | POA: Diagnosis not present

## 2023-08-09 LAB — CBC WITH DIFFERENTIAL/PLATELET
Abs Immature Granulocytes: 0.04 10*3/uL (ref 0.00–0.07)
Basophils Absolute: 0.1 10*3/uL (ref 0.0–0.1)
Basophils Relative: 1 %
Eosinophils Absolute: 0.4 10*3/uL (ref 0.0–0.5)
Eosinophils Relative: 4 %
HCT: 36.4 % (ref 36.0–46.0)
Hemoglobin: 12 g/dL (ref 12.0–15.0)
Immature Granulocytes: 1 %
Lymphocytes Relative: 15 %
Lymphs Abs: 1.3 10*3/uL (ref 0.7–4.0)
MCH: 30.2 pg (ref 26.0–34.0)
MCHC: 33 g/dL (ref 30.0–36.0)
MCV: 91.5 fL (ref 80.0–100.0)
Monocytes Absolute: 1 10*3/uL (ref 0.1–1.0)
Monocytes Relative: 12 %
Neutro Abs: 5.6 10*3/uL (ref 1.7–7.7)
Neutrophils Relative %: 67 %
Platelets: 280 10*3/uL (ref 150–400)
RBC: 3.98 MIL/uL (ref 3.87–5.11)
RDW: 13.3 % (ref 11.5–15.5)
WBC: 8.4 10*3/uL (ref 4.0–10.5)
nRBC: 0 % (ref 0.0–0.2)

## 2023-08-09 LAB — COMPREHENSIVE METABOLIC PANEL
ALT: 13 U/L (ref 0–44)
AST: 19 U/L (ref 15–41)
Albumin: 2.3 g/dL — ABNORMAL LOW (ref 3.5–5.0)
Alkaline Phosphatase: 58 U/L (ref 38–126)
Anion gap: 8 (ref 5–15)
BUN: 21 mg/dL (ref 8–23)
CO2: 25 mmol/L (ref 22–32)
Calcium: 8.3 mg/dL — ABNORMAL LOW (ref 8.9–10.3)
Chloride: 103 mmol/L (ref 98–111)
Creatinine, Ser: 0.78 mg/dL (ref 0.44–1.00)
GFR, Estimated: >60 mL/min (ref >60)
Glucose, Bld: 112 mg/dL — ABNORMAL HIGH (ref 70–99)
Potassium: 4.7 mmol/L (ref 3.5–5.1)
Sodium: 136 mmol/L (ref 135–145)
Total Bilirubin: 0.5 mg/dL (ref 0.3–1.2)
Total Protein: 6.4 g/dL — ABNORMAL LOW (ref 6.5–8.1)

## 2023-08-09 LAB — CULTURE, BLOOD (ROUTINE X 2)
Culture: NO GROWTH
Culture: NO GROWTH

## 2023-08-09 LAB — GLUCOSE, CAPILLARY
Glucose-Capillary: 112 mg/dL — ABNORMAL HIGH (ref 70–99)
Glucose-Capillary: 134 mg/dL — ABNORMAL HIGH (ref 70–99)

## 2023-08-09 LAB — BRAIN NATRIURETIC PEPTIDE: B Natriuretic Peptide: 122.5 pg/mL — ABNORMAL HIGH (ref 0.0–100.0)

## 2023-08-09 LAB — MAGNESIUM: Magnesium: 1.8 mg/dL (ref 1.7–2.4)

## 2023-08-09 MED ORDER — FUROSEMIDE 10 MG/ML IJ SOLN
60.0000 mg | Freq: Once | INTRAMUSCULAR | Status: AC
  Administered 2023-08-09: 60 mg via INTRAVENOUS
  Filled 2023-08-09: qty 6

## 2023-08-09 MED ORDER — VALSARTAN 320 MG PO TABS: 320.0000 mg | ORAL_TABLET | Freq: Every day | ORAL | Status: DC

## 2023-08-09 MED ORDER — BACLOFEN 5 MG PO TABS
5.0000 mg | ORAL_TABLET | Freq: Three times a day (TID) | ORAL | 0 refills | Status: DC | PRN
  Filled 2023-08-09: qty 21, 7d supply, fill #0

## 2023-08-09 MED ORDER — FUROSEMIDE 40 MG PO TABS
40.0000 mg | ORAL_TABLET | Freq: Every day | ORAL | 25 refills | Status: DC
  Filled 2023-08-09: qty 14, 14d supply, fill #0

## 2023-08-09 MED ORDER — MAGNESIUM SULFATE 2 GM/50ML IV SOLN
2.0000 g | Freq: Once | INTRAVENOUS | Status: AC
  Administered 2023-08-09: 2 g via INTRAVENOUS
  Filled 2023-08-09: qty 50

## 2023-08-09 MED ORDER — SPIRONOLACTONE 25 MG PO TABS
12.5000 mg | ORAL_TABLET | Freq: Once | ORAL | 0 refills | Status: DC
  Filled 2023-08-09: qty 14, 28d supply, fill #0

## 2023-08-09 MED ORDER — SPIRONOLACTONE 25 MG PO TABS
25.0000 mg | ORAL_TABLET | Freq: Once | ORAL | Status: AC
  Administered 2023-08-09: 25 mg via ORAL
  Filled 2023-08-09: qty 1

## 2023-08-09 MED ORDER — ACETAMINOPHEN 500 MG PO TABS
500.0000 mg | ORAL_TABLET | Freq: Three times a day (TID) | ORAL | 0 refills | Status: AC | PRN
  Filled 2023-08-09: qty 20, 7d supply, fill #0

## 2023-08-09 NOTE — Care Management Important Message (Signed)
Important Message  Patient Details  Name: Andrea Brooks MRN: [id number] Date of Birth: 1936/03/08   Medicare Important Message Given:  Yes     Alyxander Kollmann Stefan Church 08/09/2023, 4:41 PM

## 2023-08-09 NOTE — Discharge Instructions (Signed)
Follow with Primary MD Melida Quitter, PA in 3 days   Get CBC, CMP, 2 view Chest X ray -  checked next visit with your primary MD    Activity: As tolerated with Full fall precautions use walker/cane & assistance as needed  Disposition Home   Diet: Heart Healthy strict 1.5 L fluid restriction per day  Special Instructions: If you have smoked or chewed Tobacco  in the last 2 yrs please stop smoking, stop any regular Alcohol  and or any Recreational drug use.  On your next visit with your primary care physician please Get Medicines reviewed and adjusted.  Please request your Prim.MD to go over all Hospital Tests and Procedure/Radiological results at the follow up, please get all Hospital records sent to your Prim MD by signing hospital release before you go home.  If you experience worsening of your admission symptoms, develop shortness of breath, life threatening emergency, suicidal or homicidal thoughts you must seek medical attention immediately by calling 911 or calling your MD immediately  if symptoms less severe.  You Must read complete instructions/literature along with all the possible adverse reactions/side effects for all the Medicines you take and that have been prescribed to you. Take any new Medicines after you have completely understood and accpet all the possible adverse reactions/side effects.   Do not drive when taking Pain medications.  Do not take more than prescribed Pain, Sleep and Anxiety Medications

## 2023-08-09 NOTE — TOC Progression Note (Signed)
Transition of Care [facility]) - Progression Note    Patient Details  Name: Andrea Brooks MRN: [id number] Date of Birth: 08/22/1936  Transition of [facility]) CM/SW Contact  Gordy Clement, RN Phone Number: 08/09/2023, 11:56 AM  Clinical Narrative:    Patient to dc to home today where she lives with her Husband.  Home Health PT has been  recommended. Frances Furbish will provide services- patient choice  There has been no recommendation for DME and patient states she has all DME at home. Husband and Daughjter will trabsport home this afternoon.   No additional TOC needs         Expected Discharge Plan and Services         Expected Discharge Date: 08/09/23               DME Arranged: [employer] DME Agency: [employer] Date DME Agency Contacted: 08/07/23 Time DME Agency Contacted: [phone] Representative spoke with at DME Agency: Vaughan Basta             Social Determinants of Health (SDOH) Interventions SDOH Screenings   Food Insecurity: No Food Insecurity (08/04/2023)  Housing: Low Risk  (08/04/2023)  Transportation Needs: No Transportation Needs (08/04/2023)  Utilities: Not At Risk (08/04/2023)  Alcohol Screen: Low Risk  (04/08/2023)  Depression (PHQ2-9): Low Risk  (04/08/2023)  Financial Resource Strain: Low Risk  (04/08/2023)  Physical Activity: Inactive (04/08/2023)  Social Connections: Moderately Isolated (04/08/2023)  Stress: No Stress Concern Present (04/08/2023)  Tobacco Use: Medium Risk (07/22/2023)    Readmission Risk Interventions     No data to display

## 2023-08-09 NOTE — Progress Notes (Signed)
Physical Therapy Treatment Patient Details Name: Andrea Brooks MRN: [id number] DOB: 11-29-1935 Today's Date: 08/09/2023   History of Present Illness 87 y.o. female admitted 9/11 with numbness in extremities, weakness and fatigue with hypokalemia. PMhx: T2DM, HTN, HLD, CHF, GERD, anxiety, pneumatosis cystoides    PT Comments  Pt at or close to reaching goals, ready for d/c.  Emphasis on transfer and RW safety, progression of gait stability/stamina and safe negotiation of stairs with rail, but no other assist.     If plan is discharge home, recommend the following: Assistance with cooking/housework;Assist for transportation;Help with stairs or ramp for entrance   Can travel by private vehicle        Equipment Recommendations  None recommended by PT    Recommendations for Other Services       Precautions / Restrictions Precautions Precautions: Fall Precaution Comments: watch sats     Mobility  Bed Mobility                    Transfers                   General transfer comment: OOB in the reliner on arrival, returned to the recliner    Ambulation/Gait Ambulation/Gait assistance: Supervision Gait Distance (Feet): 200 Feet Assistive device: Rolling walker (2 wheels) Gait Pattern/deviations: Step-through pattern, Decreased stride length   Gait velocity interpretation: <1.8 ft/sec, indicate of risk for recurrent falls   General Gait Details: steady gait with the RW, good proximity/use of the RW, cues for best posture, minor fatigue   Stairs Stairs: Yes Stairs assistance: Contact guard assist Stair Management: One rail Right, Two rails, Step to pattern, Forwards Number of Stairs: 8 General stair comments: safe with rail (s) and step to pattern.   Wheelchair Mobility     Tilt Bed    Modified Rankin (Stroke Patients Only)       Balance Overall balance assessment: Mild deficits observed, not formally tested                                           Cognition Arousal: Alert Behavior During Therapy: WFL for tasks assessed/performed Overall Cognitive Status: Within Functional Limits for tasks assessed                                          Exercises General Exercises - Lower Extremity Hip ABduction/ADduction: AROM, 10 reps, Strengthening, Both, Standing Hip Flexion/Marching: AROM, Both, 10 reps, Standing Other Exercises Other Exercises: modified sink push ups x10 reps.    General Comments General comments (skin integrity, edema, etc.): vss overall      Pertinent Vitals/Pain Pain Assessment Pain Assessment: Faces Faces Pain Scale: No hurt Pain Intervention(s): Monitored during session    Home Living                          Prior Function            PT Goals (current goals can now be found in the care plan section) Acute Rehab PT Goals Patient Stated Goal: return home PT Goal Formulation: With patient Time For Goal Achievement: 08/19/23 Potential to Achieve Goals: Good Progress towards PT goals: Progressing toward goals    Frequency    Min  1X/week      PT Plan      Co-evaluation              AM-PAC PT "6 Clicks" Mobility   Outcome Measure  Help needed turning from your back to your side while in a flat bed without using bedrails?: None Help needed moving from lying on your back to sitting on the side of a flat bed without using bedrails?: A Little Help needed moving to and from a bed to a chair (including a wheelchair)?: A Little Help needed standing up from a chair using your arms (e.g., wheelchair or bedside chair)?: A Little Help needed to walk in hospital room?: A Little Help needed climbing 3-5 steps with a railing? : A Little 6 Click Score: 19    End of Session   Activity Tolerance: Patient tolerated treatment well Patient left: in chair;with call bell/phone within reach;with chair alarm set Nurse Communication: Mobility status PT  Visit Diagnosis: Other abnormalities of gait and mobility (R26.89)     Time: 1024-1050 PT Time Calculation (min) (ACUTE ONLY): 26 min  Charges:    $Gait Training: 8-22 mins $Therapeutic Exercise: 8-22 mins PT General Charges $$ ACUTE PT VISIT: 1 Visit                     08/09/2023  Jacinto Halim., PT Acute Rehabilitation Services [phone]  (office)   Eliseo Gum Kaiyu Mirabal 08/09/2023, 11:21 AM

## 2023-08-09 NOTE — Progress Notes (Signed)
Mobility Specialist Progress Note:   08/09/23 0915  Mobility  Activity Ambulated with assistance in hallway  Level of Assistance Standby assist, set-up cues, supervision of patient - no hands on  Assistive Device Front wheel walker  Distance Ambulated (ft) 150 ft  Activity Response Tolerated well  Mobility Referral Yes  Mobility Specialist Start Time (ACUTE ONLY) 0915  Mobility Specialist Stop Time (ACUTE ONLY) 0935  Mobility Specialist Time Calculation (min) (ACUTE ONLY) 20 min   Pt agreeable to mobility. Required no physical assistance, denied SOB. However unable to get reliable SPO2 reading during ambulation. SPO2 WFL on RA post mobility. Left in chair with all needs met.   Addison Lank Mobility Specialist Please contact via SecureChat or  Rehab office at [phone]

## 2023-08-09 NOTE — Plan of Care (Signed)
Pt has rested quietly throughout the night with no distress noted. Alert and oriented. On room air. SR. No SOA noted. Up to BR with assist X1 and walker. No complaints voiced.     Problem: Education: Goal: Ability to describe self-care measures that may prevent or decrease complications (Diabetes Survival Skills Education) will improve Outcome: Progressing Goal: Individualized Educational Video(s) Outcome: Progressing   Problem: Coping: Goal: Ability to adjust to condition or change in health will improve Outcome: Progressing   Problem: Fluid Volume: Goal: Ability to maintain a balanced intake and output will improve Outcome: Progressing   Problem: Health Behavior/Discharge Planning: Goal: Ability to identify and utilize available resources and services will improve Outcome: Progressing Goal: Ability to manage health-related needs will improve Outcome: Progressing   Problem: Metabolic: Goal: Ability to maintain appropriate glucose levels will improve Outcome: Progressing   Problem: Nutritional: Goal: Maintenance of adequate nutrition will improve Outcome: Progressing Goal: Progress toward achieving an optimal weight will improve Outcome: Progressing   Problem: Skin Integrity: Goal: Risk for impaired skin integrity will decrease Outcome: Progressing   Problem: Tissue Perfusion: Goal: Adequacy of tissue perfusion will improve Outcome: Progressing   Problem: Education: Goal: Knowledge of General Education information will improve Description: Including pain rating scale, medication(s)/side effects and non-pharmacologic comfort measures Outcome: Progressing   Problem: Health Behavior/Discharge Planning: Goal: Ability to manage health-related needs will improve Outcome: Progressing   Problem: Clinical Measurements: Goal: Ability to maintain clinical measurements within normal limits will improve Outcome: Progressing Goal: Will remain free from infection Outcome:  Progressing Goal: Diagnostic test results will improve Outcome: Progressing Goal: Respiratory complications will improve Outcome: Progressing Goal: Cardiovascular complication will be avoided Outcome: Progressing   Problem: Activity: Goal: Risk for activity intolerance will decrease Outcome: Progressing   Problem: Nutrition: Goal: Adequate nutrition will be maintained Outcome: Progressing   Problem: Coping: Goal: Level of anxiety will decrease Outcome: Progressing   Problem: Elimination: Goal: Will not experience complications related to bowel motility Outcome: Progressing Goal: Will not experience complications related to urinary retention Outcome: Progressing   Problem: Pain Managment: Goal: General experience of comfort will improve Outcome: Progressing   Problem: Safety: Goal: Ability to remain free from injury will improve Outcome: Progressing   Problem: Skin Integrity: Goal: Risk for impaired skin integrity will decrease Outcome: Progressing

## 2023-08-09 NOTE — Discharge Summary (Signed)
Andrea Brooks ZOX:[id number] DOB: 12-12-1935 DOA: 08/04/2023  PCP: Melida Quitter, PA  Admit date: 08/04/2023  Discharge date: 08/09/2023  Admitted From: Home   Disposition:  Home   Recommendations for Outpatient Follow-up:   Follow up with PCP in 1-2 weeks  PCP Please obtain BMP/CBC, 2 view CXR in 1week,  (see Discharge instructions)   PCP Please follow up on the following pending results: Monitor CMP, Magnesium, blood pressure closely.  Needs close outpatient monitoring of diuretics.  Pete two-view chest x-ray in 2 to 3 weeks, must follow-up with her cardiologist within a week.  If pleural effusion persists then outpatient pulmonary follow-up.   Home Health: PT, RN if qualifies Equipment/Devices: Home oxygen, walker Consultations: None  Discharge Condition: Stable    CODE STATUS: Full    Diet Recommendation: Heart Healthy, 1.5 L fluid restriction per day    Brief history of present illness from the day of admission and additional interim summary    87 y.o. female with PMH significant for DM2, HTN, HLD, CHF, GERD, anxiety, Per family, patient has a rare condition called pneumatosis cystoides in which she has some 'blisters' in the intestinal wall that gives her intermittent gastroenteritis symptoms.  She follows up at [facility] and is on pyridostigmine bromide daily which slows down her bowel.  To prevent bacterial overgrowth from slow bowel, patient has been taking Flagyl 500 mg daily.    Due to her GI condition at baseline she has poor appetite does not eat well, recently her Flagyl was increased to 500 twice a day but she stopped taking it as she was developing tingling numbness, she presented to the ER as she started having tingling numbness in her extremities and thought this was due to Flagyl, in the ER  she was found to have PCM, hypomagnesemia, hypocalcemia and she was admitted for further care.                                                                 Hospital Course     Acute on chronic diastolic CHF EF 60%- has crackles and exertional shortness of breath, also requiring oxygen at rest at times, continue diuresis IV Lasix, continued on beta-blocker and ARB, much improved after diuresis, at rest on room air, will be placed on Lasix oral along with low-dose Aldactone, beta-blocker and ARB combination.  Discharged home.  PCP to monitor and adjust medications, request to check blood pressure, CMP, magnesium levels and blood pressure in 3 to 4 days postdischarge.  Needs outpatient cardiology follow-up, if pleural effusion persists then outpatient pulmonary follow-up as well.   Poor oral intake and malabsorption due to underlying GI issues, moderate protein calorie malnutrition, hypomagnesemia, hypokalemia, severe hypocalcemia causing tingling numbness. She has been placed on oral protein supplements, replace magnesium and calcium,  monitor electrolytes.  Advance activity.  PT OT.   Possible right lower lobe pneumonia upon admission.  Placed on empiric IV antibiotics with improvement.  SLP eval.  Continue to monitor.   Prolonged QTc.  Due to multiple electrolyte abnormalities.  Resolved after electrolytes replaced on EKG of 08/06/2023.   Pneumatosis cystoides/ SIBO - Resume Mestinon and Flagyl.  Post discharge follow-up with gastroenterologist at [facility].   Mildly elevated troponin at the time of admission.  Not an ACS pattern, trend is flat, chest pain-free, continue beta-blocker, aspirin and statin for secondary prevention, TTE stable with preserved EF and no regional wall motion abnormalities.  Outpatient cardiology follow-up.   HTN -  Coreg, valsartan, and present diuretic regimen, stable, PCP to monitor electrolytes closely as she is both on Aldactone and ARB.   HLD   on aspirin,  statin   GERD - Home rx   DM2 - Home rx  Discharge diagnosis     Principal Problem:   Hypokalemia    Discharge instructions    Discharge Instructions     Discharge instructions   Complete by: As directed    Follow with Primary MD Melida Quitter, PA in 3 days   Get CBC, CMP, 2 view Chest X ray -  checked next visit with your primary MD    Activity: As tolerated with Full fall precautions use walker/cane & assistance as needed  Disposition Home   Diet: Heart Healthy strict 1.5 L fluid restriction per day  Special Instructions: If you have smoked or chewed Tobacco  in the last 2 yrs please stop smoking, stop any regular Alcohol  and or any Recreational drug use.  On your next visit with your primary care physician please Get Medicines reviewed and adjusted.  Please request your Prim.MD to go over all Hospital Tests and Procedure/Radiological results at the follow up, please get all Hospital records sent to your Prim MD by signing hospital release before you go home.  If you experience worsening of your admission symptoms, develop shortness of breath, life threatening emergency, suicidal or homicidal thoughts you must seek medical attention immediately by calling 911 or calling your MD immediately  if symptoms less severe.  You Must read complete instructions/literature along with all the possible adverse reactions/side effects for all the Medicines you take and that have been prescribed to you. Take any new Medicines after you have completely understood and accpet all the possible adverse reactions/side effects.   Do not drive when taking Pain medications.  Do not take more than prescribed Pain, Sleep and Anxiety Medications   Increase activity slowly   Complete by: As directed        Discharge Medications   Allergies as of 08/09/2023       Reactions   Tape Other (See Comments)   Skin tears easily.  OK with paper tape.   Flagyl [metronidazole] Other (See  Comments)   Neuropathy with 250mg  BID dosing.  OK to take 250mg  QD        Medication List     TAKE these medications    acetaminophen 500 MG tablet Commonly known as: TYLENOL Take 1 tablet (500 mg total) by mouth every 8 (eight) hours as needed for moderate pain. What changed:  how much to take when to take this   aspirin EC 81 MG tablet Take 81 mg by mouth daily.   Baclofen 5 MG Tabs Take 1 tablet (5 mg total) by mouth 3 (three) times daily  as needed for muscle spasms.   carvedilol 6.25 MG tablet Commonly known as: COREG TAKE 1 TABLET TWICE A DAY WITH MEALS   furosemide 40 MG tablet Commonly known as: Lasix Take 1 tablet (40 mg total) by mouth daily.   metroNIDAZOLE 250 MG tablet Commonly known as: FLAGYL Take 1 tablet (250 mg total) by mouth 3 (three) times daily.   Multi For Her 50+ Tabs Take 1 tablet by mouth daily.   omeprazole 20 MG capsule Commonly known as: PRILOSEC TAKE 1 CAPSULE DAILY 30 MINUTES BEFORE BREAKFAST   polyethylene glycol powder 17 GM/SCOOP powder Commonly known as: GLYCOLAX/MIRALAX Take 8.5 g by mouth daily.   pyridostigmine 60 MG tablet Commonly known as: MESTINON Take 60 mg by mouth daily.   simvastatin 20 MG tablet Commonly known as: ZOCOR Take 1 tablet (20 mg total) by mouth at bedtime. What changed: when to take this   spironolactone 25 MG tablet Commonly known as: ALDACTONE Take 0.5 tablets (12.5 mg total) by mouth once for 1 dose.   valsartan 320 MG tablet Commonly known as: DIOVAN Take 1 tablet (320 mg total) by mouth daily. Start taking on: August 10, 2023               Durable Medical Equipment  (From admission, onward)           Start     Ordered   08/09/23 [phone]  For home use only DME oxygen  Once       Question Answer Comment  Length of Need 6 Months   Mode or (Route) Nasal cannula   Liters per Minute 2   Frequency Continuous (stationary and portable oxygen unit needed)   Oxygen conserving  device Yes   Oxygen delivery system Gas      08/09/23 0951   08/09/23 0952  For home use only DME Walker  Once       Question:  Patient needs a walker to treat with the following condition  Answer:  Weakness   08/09/23 0951   08/06/23 1721  For home use only DME oxygen  Once       Question Answer Comment  Length of Need 6 Months   Mode or (Route) Nasal cannula   Liters per Minute 2   Frequency Continuous (stationary and portable oxygen unit needed)   Oxygen conserving device Yes   Oxygen delivery system Gas      08/06/23 1720             Follow-up Information     Saralyn Pilar A, PA. Schedule an appointment as soon as possible for a visit in 3 day(s).   Specialty: Family Medicine Contact information: [address] [place] [address] [phone]         Chrystie Nose, MD. Schedule an appointment as soon as possible for a visit in 1 week(s).   Specialty: Cardiology Contact information: [address] [place] [address] [phone]                 Major procedures and Radiology Reports - PLEASE review detailed and final reports thoroughly  -      DG Chest Port 1 View  Result Date: 08/07/2023 CLINICAL DATA:  Shortness of breath. EXAM: PORTABLE CHEST 1 VIEW COMPARISON:  08/04/2023 FINDINGS: Upper normal to mild cardiomegaly. Underlying chronic interstitial lung disease again noted. Persistent right base collapse/consolidation with small effusion. Bones are diffusely demineralized. Telemetry leads overlie the chest. IMPRESSION:  Persistent right base collapse/consolidation with small effusion. Electronically Signed   By: Kennith Center M.D.   On: 08/07/2023 08:09   ECHOCARDIOGRAM COMPLETE  Result Date: 08/05/2023    ECHOCARDIOGRAM REPORT   Patient Name:   ANGLE DOTO Date of Exam: 08/05/2023 Medical Rec #:  [id number]      Height:       63.0 in Accession #:    [id number]     Weight:       136.9 lb Date of Birth:  06-16-1936        BSA:          1.646 m Patient Age:    87 years       BP:           161/68 mmHg Patient Gender: F              HR:           75 bpm. Exam Location:  Inpatient Procedure: 2D Echo, Cardiac Doppler and Color Doppler Indications:    Abnormal ECG 794.31 / R94.31                 Elevated Troponin  History:        Patient has prior history of Echocardiogram examinations, most                 recent 07/03/2018. CHF, Previous Myocardial Infarction; Risk                 Factors:Diabetes, Dyslipidemia and Former Smoker.  Sonographer:    Dondra Prader RVT Referring Phys: [id number] [place] [facility] IMPRESSIONS  1. Left ventricular ejection fraction, by estimation, is 60 to 65%. The left ventricle has normal function. The left ventricle has no regional wall motion abnormalities. There is mild concentric left ventricular hypertrophy. Left ventricular diastolic parameters are consistent with Grade II diastolic dysfunction (pseudonormalization).  2. Right ventricular systolic function is normal. The right ventricular size is normal.  3. Left atrial size was moderately dilated.  4. The mitral valve is normal in structure. Mild mitral valve regurgitation. No evidence of mitral stenosis.  5. The aortic valve is tricuspid. There is mild calcification of the aortic valve. Aortic valve regurgitation is not visualized. Aortic valve sclerosis/calcification is present, without any evidence of aortic stenosis.  6. Aortic dilatation noted. There is mild dilatation of the ascending aorta, measuring 42 mm.  7. The inferior vena cava is normal in size with greater than 50% respiratory variability, suggesting right atrial pressure of 3 mmHg. FINDINGS  Left Ventricle: Left ventricular ejection fraction, by estimation, is 60 to 65%. The left ventricle has normal function. The left ventricle has no regional wall motion abnormalities. The left ventricular internal cavity size was normal in size. There is  mild concentric left ventricular hypertrophy. Left  ventricular diastolic parameters are consistent with Grade II diastolic dysfunction (pseudonormalization). Right Ventricle: The right ventricular size is normal. No increase in right ventricular wall thickness. Right ventricular systolic function is normal. Left Atrium: Left atrial size was moderately dilated. Right Atrium: Right atrial size was normal in size. Pericardium: There is no evidence of pericardial effusion. Mitral Valve: The mitral valve is normal in structure. Mild mitral annular calcification. Mild mitral valve regurgitation. No evidence of mitral valve stenosis. Tricuspid Valve: The tricuspid valve is normal in structure. Tricuspid valve regurgitation is mild . No evidence of tricuspid stenosis. Aortic Valve: The aortic valve is tricuspid. There is mild calcification of the aortic valve. Aortic  valve regurgitation is not visualized. Aortic valve sclerosis/calcification is present, without any evidence of aortic stenosis. Aortic valve mean gradient measures 3.0 mmHg. Aortic valve peak gradient measures 5.6 mmHg. Aortic valve area, by VTI measures 2.28 cm. Pulmonic Valve: The pulmonic valve was normal in structure. Pulmonic valve regurgitation is mild. No evidence of pulmonic stenosis. Aorta: Aortic dilatation noted. There is mild dilatation of the ascending aorta, measuring 42 mm. Venous: The inferior vena cava is normal in size with greater than 50% respiratory variability, suggesting right atrial pressure of 3 mmHg. IAS/Shunts: No atrial level shunt detected by color flow Doppler.  LEFT VENTRICLE PLAX 2D LVIDd:         4.90 cm   Diastology LVIDs:         3.60 cm   LV e' medial:    5.23 cm/s LV PW:         1.20 cm   LV E/e' medial:  20.8 LV IVS:        1.20 cm   LV e' lateral:   7.54 cm/s LVOT diam:     1.90 cm   LV E/e' lateral: 14.5 LV SV:         58 LV SV Index:   35 LVOT Area:     2.84 cm  RIGHT VENTRICLE             IVC RV Basal diam:  3.30 cm     IVC diam: 1.50 cm RV S prime:     13.10 cm/s  TAPSE (M-mode): 2.1 cm LEFT ATRIUM             Index        RIGHT ATRIUM           Index LA diam:        4.50 cm 2.73 cm/m   RA Area:     14.00 cm LA Vol (A2C):   64.4 ml 39.10 ml/m  RA Volume:   30.50 ml  18.53 ml/m LA Vol (A4C):   72.0 ml 43.74 ml/m LA Biplane Vol: 72.8 ml 44.23 ml/m  AORTIC VALVE                    PULMONIC VALVE AV Area (Vmax):    2.27 cm     PV Vmax:          1.20 m/s AV Area (Vmean):   2.29 cm     PV Peak grad:     5.8 mmHg AV Area (VTI):     2.28 cm     PR End Diast Vel: 6.25 msec AV Vmax:           118.00 cm/s AV Vmean:          79.900 cm/s AV VTI:            0.252 m AV Peak Grad:      5.6 mmHg AV Mean Grad:      3.0 mmHg LVOT Vmax:         94.40 cm/s LVOT Vmean:        64.500 cm/s LVOT VTI:          0.203 m LVOT/AV VTI ratio: 0.81  AORTA Ao Root diam: 3.40 cm Ao Asc diam:  4.20 cm MITRAL VALVE                TRICUSPID VALVE MV Area (PHT): 4.06 cm     TR Peak grad:   22.5 mmHg MV Decel Time:  187 msec     TR Vmax:        237.00 cm/s MV E velocity: 109.00 cm/s MV A velocity: 100.00 cm/s  SHUNTS MV E/A ratio:  1.09         Systemic VTI:  0.20 m                             Systemic Diam: 1.90 cm Arvilla Meres MD Electronically signed by Arvilla Meres MD Signature Date/Time: 08/05/2023/2:23:26 PM    Final    DG Chest 2 View  Result Date: 08/04/2023 CLINICAL DATA:  Chest pain. EXAM: CHEST - 2 VIEW COMPARISON:  July 22, 2023. FINDINGS: Stable cardiomediastinal silhouette. Hypoinflation of the lungs is noted. Left lung is clear. Mild right basilar atelectasis or infiltrate is noted with small right pleural effusion. Bony thorax is unremarkable. IMPRESSION: Hypoinflation of the lungs. Mild right basilar atelectasis or infiltrate with small right pleural effusion. Electronically Signed   By: Lupita Raider M.D.   On: 08/04/2023 12:48   CT Angio Chest PE W/Cm &/Or Wo Cm  Result Date: 07/22/2023 CLINICAL DATA:  One day history of shortness of breath and pleuritic chest pain  EXAM: CT ANGIOGRAPHY CHEST WITH CONTRAST TECHNIQUE: Multidetector CT imaging of the chest was performed using the standard protocol during bolus administration of intravenous contrast. Multiplanar CT image reconstructions and MIPs were obtained to evaluate the vascular anatomy. RADIATION DOSE REDUCTION: This exam was performed according to the departmental dose-optimization program which includes automated exposure control, adjustment of the mA and/or kV according to patient size and/or use of iterative reconstruction technique. CONTRAST:  75mL OMNIPAQUE IOHEXOL 350 MG/ML SOLN COMPARISON:  Chest radiograph dated 07/22/2023, CTA chest dated 07/02/2018 FINDINGS: Cardiovascular: The study is high quality for the evaluation of pulmonary embolism. There are no filling defects in the central, lobar, segmental or subsegmental pulmonary artery branches to suggest acute pulmonary embolism. Ascending thoracic aorta measures 4.3 x 4.3 cm, previously 4.2 x 4.1 cm. Multichamber cardiomegaly. No significant pericardial fluid/thickening. Coronary artery calcifications and aortic atherosclerosis. Mediastinum/Nodes: Imaged thyroid gland without nodules meeting criteria for imaging follow-up by size. Normal esophagus. 1.0 cm subcarinal lymph node (301:47). Lungs/Pleura: The central airways are patent. Mosaic ground-glass densities with mild interlobular septal thickening. No focal consolidation. No pneumothorax. Small left pleural effusion. Upper abdomen: Normal. Musculoskeletal: No acute or abnormal lytic or blastic osseous lesions. Multilevel degenerative changes of the thoracic spine. Review of the MIP images confirms the above findings. IMPRESSION: 1. No evidence of pulmonary embolism. 2. Cardiomegaly with mild interstitial pulmonary edema and small left pleural effusion. 3. Subcarinal lymphadenopathy, likely reactive. 4. Ascending thoracic aortic aneurysm measures 4.3 cm, not substantially changed. Recommend annual imaging  followup by CTA or MRA. This recommendation follows 2010 ACCF/AHA/AATS/ACR/ASA/SCA/SCAI/SIR/STS/SVM Guidelines for the Diagnosis and Management of Patients with Thoracic Aortic Disease. Circulation. 2010; 121: W098-J191. Aortic aneurysm NOS (ICD10-I71.9) 5. Aortic Atherosclerosis (ICD10-I70.0). Coronary artery calcifications. Assessment for potential risk factor modification, dietary therapy or pharmacologic therapy may be warranted, if clinically indicated. Electronically Signed   By: Agustin Andrea M.D.   On: 07/22/2023 14:56   DG Chest Port 1 View  Result Date: 07/22/2023 CLINICAL DATA:  Chest pain. EXAM: PORTABLE CHEST 1 VIEW COMPARISON:  X-ray 08/25/2020 FINDINGS: Underinflation. Diffuse interstitial changes. Tiny left effusion. No pneumothorax. Slightly more focal opacity in the left lung base. Overlapping cardiac leads. Stable cardiopericardial silhouette. Film is under penetrated. IMPRESSION: Underinflation with some interstitial  changes, chronic. Slightly more focal subtle opacity in the left lung base with a tiny left effusion. Acute process is possible. Recommend follow-up Electronically Signed   By: Karen Kays M.D.   On: 07/22/2023 12:45    Micro Results    Recent Results (from the past 240 hour(s))  Urine Culture (for pregnant, neutropenic or urologic patients or patients with an indwelling urinary catheter)     Status: Abnormal   Collection Time: 08/04/23 11:44 AM   Specimen: Urine, Clean Catch  Result Value Ref Range Status   Specimen Description URINE, CLEAN CATCH  Final   Special Requests   Final    NONE Performed at [facility], [address] [place], [place] [address]    Culture MULTIPLE SPECIES PRESENT, SUGGEST RECOLLECTION (A)  Final   Report Status 08/06/2023 FINAL  Final  SARS Coronavirus 2 by RT PCR (hospital order, performed in [facility] hospital lab) *cepheid single result test* Anterior Nasal Swab     Status: None   Collection Time: 08/04/23 12:35 PM    Specimen: Anterior Nasal Swab  Result Value Ref Range Status   SARS Coronavirus 2 by RT PCR NEGATIVE NEGATIVE Final    Comment: Performed at [facility], [address], [place], [place] [address]  Blood culture (routine x 2)     Status: None   Collection Time: 08/04/23  3:15 PM   Specimen: BLOOD  Result Value Ref Range Status   Specimen Description BLOOD SITE NOT SPECIFIED  Final   Special Requests   Final    BOTTLES DRAWN AEROBIC AND ANAEROBIC Blood Culture results may not be optimal due to an excessive volume of blood received in culture bottles   Culture   Final    NO GROWTH 5 DAYS Performed at [facility], [address] [place], [place] [address]    Report Status 08/09/2023 FINAL  Final  Blood culture (routine x 2)     Status: None   Collection Time: 08/04/23  3:29 PM   Specimen: BLOOD  Result Value Ref Range Status   Specimen Description BLOOD SITE NOT SPECIFIED  Final   Special Requests   Final    BOTTLES DRAWN AEROBIC AND ANAEROBIC Blood Culture results may not be optimal due to an excessive volume of blood received in culture bottles   Culture   Final    NO GROWTH 5 DAYS Performed at [facility], [address] [place], [place] [address]    Report Status 08/09/2023 FINAL  Final    Today   Subjective    Andrea Brooks today has no headache,no chest abdominal pain,no new weakness tingling or numbness, feels much better wants to go home today.    Objective   Blood pressure 126/71, pulse 86, temperature 97.9 F (36.6 C), temperature source Oral, resp. rate (!) 24, SpO2 95%.   Intake/Output Summary (Last 24 hours) at 08/09/2023 0958 Last data filed at 08/09/2023 0600 Gross per 24 hour  Intake 353.44 ml  Output --  Net 353.44 ml    Exam  Awake Alert, No new F.N deficits,    [place].AT,PERRAL Supple Neck,   Symmetrical Chest wall movement, Good air movement bilaterally, few bibasilar crackles but much improved RRR,No Gallops,   +ve  B.Sounds, Abd Soft, Non tender,  No Cyanosis, Clubbing or edema    Data Review   Recent Labs  Lab 08/05/23 0354 08/06/23 1313 08/07/23 0515 08/08/23 0903 08/09/23 0411  WBC 13.3*  8.1 7.7 7.6 8.4  HGB 12.5 12.8 11.6* 12.0 12.0  HCT 38.7 39.8 36.3 37.0 36.4  PLT 135* 257 262 273 280  MCV 92.6 90.2 89.4 92.0 91.5  MCH 29.9 29.0 28.6 29.9 30.2  MCHC 32.3 32.2 32.0 32.4 33.0  RDW 13.9 13.4 13.2 13.2 13.3  LYMPHSABS  --  1.2 1.4 1.1 1.3  MONOABS  --  0.5 0.9 0.8 1.0  EOSABS  --  0.1 0.3 0.3 0.4  BASOSABS  --  0.0 0.0 0.0 0.1    Recent Labs  Lab 08/04/23 1515 08/05/23 0354 08/05/23 0916 08/06/23 1313 08/07/23 0515 08/08/23 0903 08/09/23 0411  NA  --  138  --  133* 133* 135 136  K  --  4.2  --  3.9 3.8 3.9 4.7  CL  --  103  --  98 100 100 103  CO2  --  20*  --  22 25 22 25   ANIONGAP  --  15  --  13 8 13 8   GLUCOSE  --  86  --  147* 110* 143* 112*  BUN  --  18  --  11 16 20 21   CREATININE  --  0.71  --  0.84 0.83 0.71 0.78  AST  --   --  21 24 17 20 19   ALT  --   --  11 13 13 14 13   ALKPHOS  --   --  49 50 52 48 58  BILITOT  --   --  0.8 0.7 0.4 0.3 0.5  ALBUMIN  --   --  2.3* 2.3* 2.2* 2.4* 2.3*  PROCALCITON 0.76  --   --   --   --   --   --   HGBA1C 5.6  --   --   --   --   --   --   BNP  --   --   --  735.7* 253.9* 151.9* 122.5*  MG  --  1.7  --  1.7 2.1 1.7 1.8  CALCIUM  --  6.3*  --  8.0* 7.6* 8.3* 8.3*    Total Time in preparing paper work, data evaluation and todays exam - 35 minutes  Signature  -    Susa Raring M.D on 08/09/2023 at 9:58 AM   -  To page go to [email or web address]

## 2023-08-09 NOTE — Plan of Care (Signed)

## 2023-08-10 ENCOUNTER — Telehealth: Payer: Self-pay

## 2023-08-10 NOTE — Transitions of Care (Post Inpatient/ED Visit) (Signed)
08/10/2023  Name: Andrea Brooks MRN: [id number] DOB: [birth year]  Today's TOC FU Call Status: Today's TOC FU Call Status:: Successful TOC FU Call Completed TOC FU Call Complete Date: 08/10/23 Patient's Name and Date of Birth confirmed.  Transition Care Management Follow-up Telephone Call Date of Discharge: 08/09/23 Discharge Facility: [facility]) Type of Discharge: Inpatient Admission Primary Inpatient Discharge Diagnosis:: paresthesia of skin How have you been since you were released from the hospital?: Better Any questions or concerns?: No  Items Reviewed: Did you receive and understand the discharge instructions provided?: Yes Medications obtained,verified, and reconciled?: Yes (Medications Reviewed) Any new allergies since your discharge?: No Dietary orders reviewed?: Yes Do you have support at home?: Yes People in Home: spouse  Medications Reviewed Today: Medications Reviewed Today     Reviewed by Karena Addison, LPN (Licensed Practical Nurse) on 08/10/23 at 1152  Med List Status: <None>   Medication Order Taking? Sig Documenting Provider Last Dose Status Informant  acetaminophen (TYLENOL) 500 MG tablet [id number]  Take 1 tablet (500 mg total) by mouth every 8 (eight) hours as needed for moderate pain. Leroy Sea, MD  Active   aspirin EC 81 MG tablet [id number] No Take 81 mg by mouth daily. [provider] 08/04/2023 Active Self, Pharmacy Records  Baclofen 5 MG TABS [id number]  Take 1 tablet (5 mg total) by mouth 3 (three) times daily as needed for muscle spasms. Leroy Sea, MD  Active   carvedilol (COREG) 6.25 MG tablet [id number] No TAKE 1 TABLET TWICE A DAY WITH MEALS Hilty, Lisette Abu, MD 08/04/2023 Active Self, Pharmacy Records  furosemide (LASIX) 40 MG tablet [id number]  Take 1 tablet (40 mg total) by mouth daily. Leroy Sea, MD  Active   metroNIDAZOLE (FLAGYL) 250 MG tablet [id number] No Take 1 tablet (250 mg total) by mouth 3 (three) times  daily.  Patient not taking: Reported on 08/04/2023   Sherrilyn Rist, MD Not Taking Active Self, Pharmacy Records           Med Note (COFFELL, Otis Brace Aug 04, 2023  2:42 PM) Pt states she was recently increased to BID but told to stop if side effects are experienced. Pt stopped taking 2 days ago due to neuropathy.  Multiple Vitamins-Minerals (MULTI FOR HER 50+) TABS [id number] No Take 1 tablet by mouth daily. [provider] 08/04/2023 Active Self, Pharmacy Records  omeprazole (PRILOSEC) 20 MG capsule [id number] No TAKE 1 CAPSULE DAILY 30 MINUTES BEFORE BREAKFAST Leta Baptist, PA-C 08/04/2023 Active Self, Pharmacy Records  polyethylene glycol powder (GLYCOLAX/MIRALAX) 17 GM/SCOOP powder [id number] No Take 8.5 g by mouth daily. [provider] 08/04/2023 Active Self, Pharmacy Records  pyridostigmine (MESTINON) 60 MG tablet [id number] No Take 60 mg by mouth daily. [provider] 08/04/2023 Active Self, Pharmacy Records  simvastatin (ZOCOR) 20 MG tablet [id number] No Take 1 tablet (20 mg total) by mouth at bedtime.  Patient taking differently: Take 20 mg by mouth daily.   Chrystie Nose, MD 08/04/2023 Active Self, Pharmacy Records  spironolactone (ALDACTONE) 25 MG tablet [id number]  Take 0.5 tablets (12.5 mg total) by mouth once daily Leroy Sea, MD  Active   valsartan (DIOVAN) 320 MG tablet [id number]  Take 1 tablet (320 mg total) by mouth daily. Leroy Sea, MD  Active             Home Care and Equipment/Supplies: Were Home Health Services Ordered?: Yes Name of Home Health Agency::  unknown Has Agency set up a time to come to your home?: No Any new equipment or medical supplies ordered?: Yes Name of Medical supply agency?: [facility] Were you able to get the equipment/medical supplies?: Yes Do you have any questions related to the use of the equipment/supplies?: No  Functional Questionnaire: Do you need assistance with bathing/showering or  dressing?: No Do you need assistance with meal preparation?: No Do you need assistance with eating?: No Do you have difficulty maintaining continence: No Do you need assistance with getting out of bed/getting out of a chair/moving?: No Do you have difficulty managing or taking your medications?: No  Follow up appointments reviewed: PCP Follow-up appointment confirmed?: Yes Date of PCP follow-up appointment?: 08/13/23 Follow-up Provider: [facility] Follow-up appointment confirmed?: No Reason Specialist Follow-Up Not Confirmed: Patient has Specialist Provider Number and will Call for Appointment Do you need transportation to your follow-up appointment?: No Do you understand care options if your condition(s) worsen?: Yes-patient verbalized understanding    SIGNATURE Karena Addison, LPN [facility] Nurse Health Advisor Direct Dial [phone]

## 2023-08-11 ENCOUNTER — Ambulatory Visit: Payer: Medicare HMO | Attending: Physician Assistant | Admitting: Physician Assistant

## 2023-08-11 ENCOUNTER — Encounter: Payer: Self-pay | Admitting: Physician Assistant

## 2023-08-11 ENCOUNTER — Other Ambulatory Visit: Payer: Self-pay

## 2023-08-11 VITALS — BP 120/62 | HR 74 | Ht 62.0 in | Wt 136.0 lb

## 2023-08-11 DIAGNOSIS — I2510 Atherosclerotic heart disease of native coronary artery without angina pectoris: Secondary | ICD-10-CM

## 2023-08-11 DIAGNOSIS — E785 Hyperlipidemia, unspecified: Secondary | ICD-10-CM | POA: Diagnosis not present

## 2023-08-11 DIAGNOSIS — E1159 Type 2 diabetes mellitus with other circulatory complications: Secondary | ICD-10-CM | POA: Diagnosis not present

## 2023-08-11 DIAGNOSIS — I5032 Chronic diastolic (congestive) heart failure: Secondary | ICD-10-CM | POA: Diagnosis not present

## 2023-08-11 DIAGNOSIS — E119 Type 2 diabetes mellitus without complications: Secondary | ICD-10-CM

## 2023-08-11 DIAGNOSIS — I214 Non-ST elevation (NSTEMI) myocardial infarction: Secondary | ICD-10-CM

## 2023-08-11 DIAGNOSIS — I152 Hypertension secondary to endocrine disorders: Secondary | ICD-10-CM

## 2023-08-11 NOTE — Patient Instructions (Signed)
Medication Instructions:  NO CHANGES *If you need a refill on your cardiac medications before your next appointment, please call your pharmacy*   Lab Work: NO LABS If you have labs (blood work) drawn today and your tests are completely normal, you will receive your results only by: MyChart Message (if you have MyChart) OR A paper copy in the mail If you have any lab test that is abnormal or we need to change your treatment, we will call you to review the results.   Testing/Procedures: NO TESTING   Follow-Up: At [facility], you and your health needs are our priority.  As part of our continuing mission to provide you with exceptional heart care, we have created designated Provider Care Teams.  These Care Teams include your primary Cardiologist (physician) and Advanced Practice Providers (APPs -  Physician Assistants and Nurse Practitioners) who all work together to provide you with the care you need, when you need it.   Your next appointment:   6 week(s)  Provider:   Azalee Course, PA-C    Then, Chrystie Nose, MD will plan to see you again in 5 month(s).

## 2023-08-11 NOTE — Progress Notes (Unsigned)
Cardiology Office Note:  .   Date:  08/11/2023  ID:  Andrea Brooks, DOB 1936-06-13, MRN [id number] PCP: Andrea Quitter, PA  [facility] Providers Cardiologist:  Andrea Nose, MD { Click to update primary MD,subspecialty MD or APP then REFRESH:1}   History of Present Illness: .   Andrea Brooks is a 87 y.o. female with PMH of HTN, HLD, DM II, CAD, anxiety, GERD and a history of chronic diastolic heart failure.  Patient previously was admitted for persistent diarrhea due to E. coli infection.  She subsequently received large amount of hydration and developed acute diastolic heart failure.  She required diuresis and was seen by Dr. Mayford Knife and Dr. Duke Salvia.  She was noted to have elevated troponin and referred for cardiac catheterization on 07/04/2018 which showed mild nonobstructive CAD, 25% proximal LAD, 10% mid RCA, 10% proximal to mid left circumflex lesion.  Echocardiogram obtained on 07/03/2018 demonstrated EF 60 to 65%, moderate LVH, no regional wall motion abnormality.  She later established with Dr. Rennis Brooks who is also her husband's cardiologist.  She is being followed by GI service for gastrointestinal dysmotility issue.  She is being followed by [facility] and not on pyridostigmine bromide daily to slow down her bowel.  To prevent bacterial overgrowth from slow bowel, she has been taking Flagyl.  Patient was last seen by Dr. Rennis Brooks in November 2023 at which time she was doing well.    Since the last visit, patient was seen in the ED on 07/22/2023 due to nonspecific chest pain that was going on for a week.  Symptom worsened when she take a deep breath.  Serial troponin 57--> 55.  Chest x-ray showed underinflation of the lungs with some interstitial changes which is chronic, tiny left pleural effusion.  CTA of the chest shows no PE, cardiomegaly with mild interstitial pulmonary edema and a small left pleural effusion, dilated ascending aorta measuring at 4.3 cm, aortic atherosclerosis with  coronary artery calcification.  COVID and influenza test negative.  Her symptom was suspected to be musculoskeletal in nature.  She was readmitted last week on 08/04/2019 for was tingling and numbness in the extremities.  On arrival, white blood cell count was elevated 25,000.  Potassium low at 3.  Troponin elevated at 93.  Due to her GI condition at baseline, she has very poor appetite.  Recently her Flagyl was increased to 500 mg twice a day but she stopped taking it as she develop tingling numbness.  She was found to have acute on chronic diastolic heart failure on exam, moderate protein calorie malnutrition, hypomagnesemia, hypocalcemia.  She was treated with IV Lasix.  Echocardiogram obtained on 08/05/2023 showed EF 60 to 65%, mild LVH, grade 2 DD, normal RV, mild MR, dilated ascending aorta measuring at 42 mm.  She was treated with empiric antibiotic for possible right lower lobe pneumonia on admission.  Patient presents today accompanied by daughter and husband.  Since discharge, her breathing is stable.  She did receive oxygen going at home, however she is not really using the oxygen.  Family is planning to get her pulse oximeter.  She is planning to obtain CBC, CMP and a chest x-ray at her PCPs office this Friday.  It is too early for [place] to obtain the blood work today since she was just released from the hospital 2 days ago.  As long as her renal function is stable, and likely will continue her on the current dose of Lasix and spironolactone.  I recommended to keep her daily fluid intake between 30 to 64 ounces per day.  She appears to be euvolemic on exam.  She has no orthopnea or PND.  She continued to have bilateral lower chest spasm on the lateral side whenever she take a deep breath, this is clearly noncardiac.  She does not have any chest pain when she exert herself.  I will hold off on doing any ischemic workup.  I plan to reassess her in 6 weeks.  She can follow-up with Dr. Rennis Brooks in 68-month.  ROS:  ***  Studies Reviewed: .        *** Risk Assessment/Calculations:   {Does this patient have ATRIAL FIBRILLATION?:[phone]}         Physical Exam:   VS:  BP 120/62 (BP Location: Left Arm, Patient Position: Sitting, Cuff Size: Normal)   Pulse 74   Ht 5\' 2"  (1.575 m)   Wt 136 lb (61.7 kg)   BMI 24.87 kg/m    Wt Readings from Last 3 Encounters:  08/11/23 136 lb (61.7 kg)  07/22/23 136 lb 14.5 oz (62.1 kg)  04/08/23 137 lb (62.1 kg)    GEN: Well nourished, well developed in no acute distress NECK: No JVD; No carotid bruits CARDIAC: ***RRR, no murmurs, rubs, gallops RESPIRATORY:  Clear to auscultation without rales, wheezing or rhonchi  ABDOMEN: Soft, non-tender, non-distended EXTREMITIES:  No edema; No deformity   ASSESSMENT AND PLAN: .   ***    {Are you ordering a CV Procedure (e.g. stress test, cath, DCCV, TEE, etc)?   Press F2        :[id number]}  Dispo: ***  Signed, Azalee Course, PA

## 2023-08-13 ENCOUNTER — Ambulatory Visit
Admission: RE | Admit: 2023-08-13 | Discharge: 2023-08-13 | Disposition: A | Discharge status: Home / Self Care | Payer: Medicare HMO | Attending: Family Medicine | Admitting: Family Medicine

## 2023-08-13 ENCOUNTER — Encounter: Payer: Self-pay | Admitting: Family Medicine

## 2023-08-13 ENCOUNTER — Ambulatory Visit (INDEPENDENT_AMBULATORY_CARE_PROVIDER_SITE_OTHER): Payer: Medicare HMO | Admitting: Family Medicine

## 2023-08-13 ENCOUNTER — Ambulatory Visit
Admission: RE | Admit: 2023-08-13 | Discharge: 2023-08-13 | Disposition: A | Discharge status: Home / Self Care | Payer: Medicare HMO | Source: Ambulatory Visit | Attending: Family Medicine

## 2023-08-13 VITALS — BP 112/68 | HR 90 | Ht 62.0 in | Wt 137.8 lb

## 2023-08-13 DIAGNOSIS — I5031 Acute diastolic (congestive) heart failure: Secondary | ICD-10-CM | POA: Insufficient documentation

## 2023-08-13 MED ORDER — OYSTER SHELL CALCIUM/D3 500-5 MG-MCG PO TABS: 1.0000 | ORAL_TABLET | Freq: Two times a day (BID) | ORAL | 1 refills | Status: DC

## 2023-08-13 NOTE — Progress Notes (Signed)
Established Patient Office Visit  Subjective   Patient ID: Andrea Brooks, female    DOB: [birth year]  Age: 87 y.o. MRN: [id number]  Chief Complaint  Patient presents with   Hospitalization Follow-up    HPI Patient is here for hospital follow-up and recent acute on chronic heart failure.  She saw her cardiologist, Dr. Rennis Golden, yesterday.  She is continue to take her spironolactone and her Lasix.  She states that her dry weight/goal weight is 132 to-133 pounds.  States she was told to call her cardiologist if she gains 2 pounds.  We discussed what to do if she cannot get a hold of her cardiologist for this.  Patient was prescribed supplemental oxygen at discharge but she has been checking her oxygen with a pulse oximeter and it has been 97%.  We discussed what her goal pulse ox rate should be.  Discussed how it can decrease when exerting herself.  Patient would like her x-ray to be done at [facility].  Patient has 3 weeks of off-and-on hoarse voice.'s feels like it is more hoarse after she exerts herself.  Patient has seen an ear nose and throat doctor for persistent runny nose.  Uses nasal corticosteroid chronically.  Patient's calcium level was low as was her magnesium in the hospital.  She says she was given IV replacement.  She states she was not told to take replacement therapy at discharge.  We discussed oral calcium supplementation, vitamin D supplementation.  Discussed rechecking her electrolytes.   The ASCVD Risk score (Arnett DK, et al., 2019) failed to calculate for the following reasons:   The 2019 ASCVD risk score is only valid for ages 38 to 60   The patient has a prior MI or stroke diagnosis  Health Maintenance Due  Topic Date Due   COVID-19 Vaccine (3 - Pfizer risk series) 03/11/2020   FOOT EXAM  09/11/2022   Pneumonia Vaccine 73+ Years old (3 of 3 - PPSV23 or PCV20) 10/05/2022   INFLUENZA VACCINE  06/24/2023      Objective:     BP 112/68   Pulse 90   Ht 5'  2" (1.575 m)   Wt 137 lb 12.8 oz (62.5 kg)   SpO2 97%   BMI 25.20 kg/m    Physical Exam General: Alert, oriented CV: Regular rate rhythm Pulmonary: Lungs clear bilaterally no wheeze or crackles MSK: Ambulates with use of walker.   No results found for any visits on 08/13/23.      Assessment & Plan:   Hypokalemia  Hypocalcemia Assessment & Plan: Required IV calcium repletion in the hospital.  Calcium level normal and discharge.  Was not advised to continue oral supplementation. - CMP, magnesium, vitamin D level - Ordered calcium-vitamin D supplementation to take daily  Orders: -     Comprehensive metabolic panel -     Magnesium -     VITAMIN D 25 Hydroxy (Vit-D Deficiency, Fractures)  Acute diastolic (congestive) heart failure (HCC) Assessment & Plan: Symptoms continue to improve.  Not using oxygen.  Continue spironolactone and Lasix.  Dry weight 133 pounds.  Discussed calling her cardiologist if she goes 2 pounds heavier than this over a brief period.  Discussed taking a second afternoon dose of Lasix if she is unable to get a hold of the cardiologist and having weight gain or symptoms of acute heart failure - Repeat CMP, magnesium -Continue current medications - Chest x-ray   Orders: -     CBC -  DG Chest 2 View; Future  Other orders -     Ethelda Chick Calcium/D3; Take 1 tablet by mouth 2 (two) times daily.  Dispense: 60 tablet; Refill: 1     No follow-ups on file.    Sandre Kitty, MD

## 2023-08-13 NOTE — Patient Instructions (Addendum)
It was nice to see you today,  We addressed the following topics today: -Continue taking medications that you have been taking.  No changes were made today. - Your goal weight appears to be 132 to 133 pounds.  If you gain 2 pounds over 1 to 2 days you should let your cardiologist know.  If you are unable to get a hold of your cardiologist something you can do is take a second dose of your furosemide in the afternoon for a day. - I will get the lab tests ordered.  I will send an order for calcium and vitamin D supplementation - Follow-up with Lequita Halt in 1 month for your October 24 appointment - Your goal oxygen level should be 92% or better.  If it is above that while not using oxygen you do not need to use the oxygen.  I would like you to use your oximeter to check your oxygen while you are ambulating to see if it decreases while you are walking.  Have a great day,  Frederic Jericho, MD

## 2023-08-13 NOTE — Assessment & Plan Note (Addendum)
Symptoms continue to improve.  Not using oxygen.  Continue spironolactone and Lasix.  Dry weight 133 pounds.  Discussed calling her cardiologist if she goes 2 pounds heavier than this over a brief period.  Discussed taking a second afternoon dose of Lasix if she is unable to get a hold of the cardiologist and having weight gain or symptoms of acute heart failure - Repeat CMP, magnesium -Continue current medications - Chest x-ray

## 2023-08-13 NOTE — Assessment & Plan Note (Signed)
Required IV calcium repletion in the hospital.  Calcium level normal and discharge.  Was not advised to continue oral supplementation. - CMP, magnesium, vitamin D level - Ordered calcium-vitamin D supplementation to take daily

## 2023-08-14 LAB — COMPREHENSIVE METABOLIC PANEL
ALT: 11 IU/L (ref 0–32)
AST: 19 IU/L (ref 0–40)
Albumin: 3.5 g/dL — ABNORMAL LOW (ref 3.7–4.7)
Alkaline Phosphatase: 86 IU/L (ref 44–121)
BUN/Creatinine Ratio: 19 (ref 12–28)
BUN: 17 mg/dL (ref 8–27)
Bilirubin Total: 0.3 mg/dL (ref 0.0–1.2)
CO2: 24 mmol/L (ref 20–29)
Calcium: 8.9 mg/dL (ref 8.7–10.3)
Chloride: 99 mmol/L (ref 96–106)
Creatinine, Ser: 0.9 mg/dL (ref 0.57–1.00)
Globulin, Total: 3.4 g/dL (ref 1.5–4.5)
Glucose: 144 mg/dL — ABNORMAL HIGH (ref 70–99)
Potassium: 4.8 mmol/L (ref 3.5–5.2)
Sodium: 139 mmol/L (ref 134–144)
Total Protein: 6.9 g/dL (ref 6.0–8.5)
eGFR: 63 mL/min/{1.73_m2} (ref >59)

## 2023-08-14 LAB — CBC
Hematocrit: 36.8 % (ref 34.0–46.6)
Hemoglobin: 11.7 g/dL (ref 11.1–15.9)
MCH: 29.6 pg (ref 26.6–33.0)
MCHC: 31.8 g/dL (ref 31.5–35.7)
MCV: 93 fL (ref 79–97)
Platelets: 386 10*3/uL (ref 150–450)
RBC: 3.95 x10E6/uL (ref 3.77–5.28)
RDW: 12.6 % (ref 11.7–15.4)
WBC: 8.2 10*3/uL (ref 3.4–10.8)

## 2023-08-14 LAB — VITAMIN D 25 HYDROXY (VIT D DEFICIENCY, FRACTURES): Vit D, 25-Hydroxy: 29.1 ng/mL — ABNORMAL LOW (ref 30.0–100.0)

## 2023-08-14 LAB — MAGNESIUM: Magnesium: 1.6 mg/dL (ref 1.6–2.3)

## 2023-08-16 ENCOUNTER — Other Ambulatory Visit: Payer: Self-pay | Admitting: Family Medicine

## 2023-08-16 MED ORDER — MAGNESIUM OXIDE -MG SUPPLEMENT 400 MG PO CAPS: 1.0000 | ORAL_CAPSULE | Freq: Two times a day (BID) | ORAL | 3 refills | Status: DC

## 2023-08-24 ENCOUNTER — Other Ambulatory Visit (HOSPITAL_COMMUNITY): Payer: Self-pay

## 2023-09-03 ENCOUNTER — Other Ambulatory Visit (HOSPITAL_COMMUNITY): Payer: Self-pay

## 2023-09-07 ENCOUNTER — Other Ambulatory Visit: Payer: Self-pay

## 2023-09-07 DIAGNOSIS — I5031 Acute diastolic (congestive) heart failure: Secondary | ICD-10-CM

## 2023-09-09 ENCOUNTER — Other Ambulatory Visit: Payer: Medicare HMO

## 2023-09-09 DIAGNOSIS — I5031 Acute diastolic (congestive) heart failure: Secondary | ICD-10-CM

## 2023-09-10 ENCOUNTER — Other Ambulatory Visit: Payer: Self-pay | Admitting: Family Medicine

## 2023-09-10 LAB — VITAMIN D 25 HYDROXY (VIT D DEFICIENCY, FRACTURES): Vit D, 25-Hydroxy: 28.8 ng/mL — ABNORMAL LOW (ref 30.0–100.0)

## 2023-09-10 LAB — CBC WITH DIFFERENTIAL/PLATELET
Basophils Absolute: 0 10*3/uL (ref 0.0–0.2)
Basos: 0 %
EOS (ABSOLUTE): 0.1 10*3/uL (ref 0.0–0.4)
Eos: 1 %
Hematocrit: 37.4 % (ref 34.0–46.6)
Hemoglobin: 11.9 g/dL (ref 11.1–15.9)
Immature Grans (Abs): 0 10*3/uL (ref 0.0–0.1)
Immature Granulocytes: 0 %
Lymphocytes Absolute: 1.7 10*3/uL (ref 0.7–3.1)
Lymphs: 16 %
MCH: 29.5 pg (ref 26.6–33.0)
MCHC: 31.8 g/dL (ref 31.5–35.7)
MCV: 93 fL (ref 79–97)
Monocytes Absolute: 0.9 10*3/uL (ref 0.1–0.9)
Monocytes: 9 %
Neutrophils Absolute: 7.4 10*3/uL — ABNORMAL HIGH (ref 1.4–7.0)
Neutrophils: 74 %
Platelets: 208 10*3/uL (ref 150–450)
RBC: 4.04 x10E6/uL (ref 3.77–5.28)
RDW: 13.7 % (ref 11.7–15.4)
WBC: 10.1 10*3/uL (ref 3.4–10.8)

## 2023-09-10 LAB — COMPREHENSIVE METABOLIC PANEL
ALT: 8 [IU]/L (ref 0–32)
AST: 18 [IU]/L (ref 0–40)
Albumin: 3.6 g/dL — ABNORMAL LOW (ref 3.7–4.7)
Alkaline Phosphatase: 74 [IU]/L (ref 44–121)
BUN/Creatinine Ratio: 26 (ref 12–28)
BUN: 25 mg/dL (ref 8–27)
Bilirubin Total: 0.4 mg/dL (ref 0.0–1.2)
CO2: 23 mmol/L (ref 20–29)
Calcium: 8.4 mg/dL — ABNORMAL LOW (ref 8.7–10.3)
Chloride: 101 mmol/L (ref 96–106)
Creatinine, Ser: 0.96 mg/dL (ref 0.57–1.00)
Globulin, Total: 3.1 g/dL (ref 1.5–4.5)
Glucose: 109 mg/dL — ABNORMAL HIGH (ref 70–99)
Potassium: 3.9 mmol/L (ref 3.5–5.2)
Sodium: 140 mmol/L (ref 134–144)
Total Protein: 6.7 g/dL (ref 6.0–8.5)
eGFR: 58 mL/min/{1.73_m2} — ABNORMAL LOW (ref >59)

## 2023-09-10 LAB — MAGNESIUM: Magnesium: 1 mg/dL — ABNORMAL LOW (ref 1.6–2.3)

## 2023-09-10 MED ORDER — MAGNESIUM GLUCONATE 250 MG PO TABS: 250.0000 mg | ORAL_TABLET | Freq: Two times a day (BID) | ORAL | 3 refills | Status: DC

## 2023-09-16 ENCOUNTER — Encounter: Payer: Self-pay | Admitting: Family Medicine

## 2023-09-16 ENCOUNTER — Ambulatory Visit (INDEPENDENT_AMBULATORY_CARE_PROVIDER_SITE_OTHER): Payer: Medicare HMO | Admitting: Family Medicine

## 2023-09-16 ENCOUNTER — Ambulatory Visit
Admission: RE | Admit: 2023-09-16 | Discharge: 2023-09-16 | Disposition: A | Discharge status: Home / Self Care | Payer: Medicare HMO | Source: Ambulatory Visit | Attending: Family Medicine | Admitting: Family Medicine

## 2023-09-16 VITALS — BP 118/75 | HR 69 | Resp 18 | Ht 62.0 in | Wt 130.0 lb

## 2023-09-16 DIAGNOSIS — R062 Wheezing: Secondary | ICD-10-CM

## 2023-09-16 DIAGNOSIS — I152 Hypertension secondary to endocrine disorders: Secondary | ICD-10-CM | POA: Diagnosis not present

## 2023-09-16 DIAGNOSIS — F419 Anxiety disorder, unspecified: Secondary | ICD-10-CM | POA: Diagnosis not present

## 2023-09-16 DIAGNOSIS — R052 Subacute cough: Secondary | ICD-10-CM

## 2023-09-16 DIAGNOSIS — E1159 Type 2 diabetes mellitus with other circulatory complications: Secondary | ICD-10-CM | POA: Diagnosis not present

## 2023-09-16 DIAGNOSIS — E559 Vitamin D deficiency, unspecified: Secondary | ICD-10-CM

## 2023-09-16 MED ORDER — LORAZEPAM 1 MG PO TABS: 0.5000 mg | ORAL_TABLET | ORAL | 1 refills | Status: DC | PRN

## 2023-09-16 MED ORDER — VITAMIN D (ERGOCALCIFEROL) 1.25 MG (50000 UNIT) PO CAPS
50000.0000 [IU] | ORAL_CAPSULE | ORAL | 0 refills | Status: DC
Start: 2023-09-16 — End: 2024-07-15

## 2023-09-16 NOTE — Progress Notes (Signed)
Established Patient Office Visit  Subjective   Patient ID: Andrea Brooks, female    DOB: [birth year]  Age: 87 y.o. MRN: [id number]  Chief Complaint  Patient presents with   Anxiety   Depression   Hypertension   URI    Wheezing, cough    HPI Andrea Brooks is a 87 y.o. female presenting today for follow up of hypertension, mood.  She endorses ongoing wheezing, cough since hospitalization for pneumonia and CHF exacerbation.  She also endorses sharp bilateral pain on lateral aspects of the back with deep breaths. Hypertension:   Pt denies chest pain, SOB, dizziness, edema, syncope, fatigue or heart palpitations. Taking valsartan, carvedilol, furosemide as directed by cardiology, reports excellent compliance with treatment. Denies side effects. Mood: Patient is here to follow up for anxiety, currently managing with as needed lorazepam.  She states that she takes it 2 to 3 days a month. Taking medication without side effects, reports excellent compliance with treatment. Denies mood changes or SI/HI. She feels mood is stable since last visit.     09/16/2023    9:35 AM 04/08/2023    2:43 PM 03/17/2023   11:00 AM  Depression screen PHQ 2/9  Decreased Interest 0 0 0  Down, Depressed, Hopeless 0 0 0  PHQ - 2 Score 0 0 0  Altered sleeping 1 0 1  Tired, decreased energy 1 0 0  Change in appetite 1 0 0  Feeling bad or failure about yourself  0 0 0  Trouble concentrating 0 0 0  Moving slowly or fidgety/restless 0 0 0  Suicidal thoughts 0 0 0  PHQ-9 Score 3 0 1  Difficult doing work/chores Somewhat difficult Not difficult at all Somewhat difficult       09/16/2023    9:36 AM 09/15/2022    8:43 AM 03/13/2022   11:10 AM 09/11/2021    9:50 AM  GAD 7 : Generalized Anxiety Score  Nervous, Anxious, on Edge 1 1 1 1   Control/stop worrying 1 1 1 1   Worry too much - different things 1 1 1 1   Trouble relaxing 0 0 0 1  Restless 0 0 0 0  Easily annoyed or irritable 0 0 0 0  Afraid - awful might  happen 0 0 0 0  Total GAD 7 Score 3 3 3 4   Anxiety Difficulty Somewhat difficult Not difficult at all Not difficult at all Not difficult at all    Outpatient Medications Prior to Visit  Medication Sig   acetaminophen (TYLENOL) 500 MG tablet Take 1 tablet (500 mg total) by mouth every 8 (eight) hours as needed for moderate pain.   aspirin EC 81 MG tablet Take 81 mg by mouth daily.   calcium-vitamin D (OSCAL WITH D) 500-5 MG-MCG tablet Take 1 tablet by mouth 2 (two) times daily.   carvedilol (COREG) 6.25 MG tablet TAKE 1 TABLET TWICE A DAY WITH MEALS   furosemide (LASIX) 40 MG tablet Take 1 tablet (40 mg total) by mouth daily.   Magnesium Gluconate 250 MG TABS Take 1-2 tablets (250-500 mg total) by mouth 2 (two) times daily. START 1 TAB DAILY, SLOWLY INCREASE AS TOLERATED.   Multiple Vitamins-Minerals (MULTI FOR HER 50+) TABS Take 1 tablet by mouth daily.   omeprazole (PRILOSEC) 20 MG capsule TAKE 1 CAPSULE DAILY 30 MINUTES BEFORE BREAKFAST   polyethylene glycol powder (GLYCOLAX/MIRALAX) 17 GM/SCOOP powder Take 8.5 g by mouth daily.   pyridostigmine (MESTINON) 60 MG tablet Take 60 mg by mouth daily.  simvastatin (ZOCOR) 20 MG tablet Take 1 tablet (20 mg total) by mouth at bedtime. (Patient taking differently: Take 20 mg by mouth daily.)   valsartan (DIOVAN) 320 MG tablet Take 1 tablet (320 mg total) by mouth daily.   [DISCONTINUED] Baclofen 5 MG TABS Take 1 tablet (5 mg total) by mouth 3 (three) times daily as needed for muscle spasms.   [DISCONTINUED] metroNIDAZOLE (FLAGYL) 250 MG tablet Take 1 tablet (250 mg total) by mouth 3 (three) times daily.   [DISCONTINUED] spironolactone (ALDACTONE) 25 MG tablet Take 0.5 tablets (12.5 mg total) by mouth once daily   No facility-administered medications prior to visit.    ROS Negative unless otherwise noted in HPI   Objective:     BP 118/75 (BP Location: Left Arm, Patient Position: Sitting, Cuff Size: Normal)   Pulse 69   Resp 18   Ht 5\' 2"   (1.575 m)   Wt 130 lb (59 kg)   SpO2 95%   BMI 23.78 kg/m   Physical Exam Constitutional:      General: She is not in acute distress.    Appearance: Normal appearance.  HENT:     Head: Normocephalic and atraumatic.  Cardiovascular:     Rate and Rhythm: Normal rate and regular rhythm.     Heart sounds: No murmur heard.    No friction rub. No gallop.  Pulmonary:     Effort: Pulmonary effort is normal. No respiratory distress.     Breath sounds: Wheezing (Inspiratory) present. No rhonchi or rales.  Skin:    General: Skin is warm and dry.  Neurological:     Mental Status: She is alert and oriented to person, place, and time.     Assessment & Plan:  Hypertension associated with diabetes (HCC) Assessment & Plan: BP goal <130/80.  Stable, at goal.  Continue follow-up with cardiology.  Continue furosemide 40 mg daily, carvedilol 6.25 mg daily, losartan 320 mg daily.  Will continue to monitor.  Orders: -     Comprehensive metabolic panel; Future  Anxiety Assessment & Plan: Discussed that benzodiazepines like lorazepam should generally be avoided for anyone over 100 years old due to concern over side effects and risk of dependence.  Discussed that it is important to use only as needed, patient verbalized understanding and is agreeable to this plan.  Orders: -     LORazepam; Take 0.5-1 tablets (0.5-1 mg total) by mouth as needed for anxiety (maximum once per day).  Dispense: 30 tablet; Refill: 1  Wheezing -     DG Chest 2 View; Future -     Ambulatory referral to Pulmonology  Subacute cough -     DG Chest 2 View; Future -     Ambulatory referral to Pulmonology  Vitamin D deficiency -     Vitamin D (Ergocalciferol); Take 1 capsule (50,000 Units total) by mouth every 7 (seven) days.  Dispense: 12 capsule; Refill: 0  Hypomagnesemia -     Magnesium; Future  Repeating chest x-ray given ongoing wheezing, cough, and pleuritic posterior pain.  Also placing referral to pulmonology as  x-ray 1 week after hospital discharge also had evidence of pleural effusion.  Patient picked up new magnesium prescription today, recheck magnesium levels in about 2 weeks.  Return in about 2 weeks (around 09/30/2023) for nonfasting blood work; 4 months for follow-up for HTN, DM, mood, POC A1C at visit.    Melida Quitter, PA

## 2023-09-16 NOTE — Assessment & Plan Note (Signed)
BP goal <130/80.  Stable, at goal.  Continue follow-up with cardiology.  Continue furosemide 40 mg daily, carvedilol 6.25 mg daily, losartan 320 mg daily.  Will continue to monitor.

## 2023-09-16 NOTE — Patient Instructions (Addendum)
Normal magnesium level: 1.6-2.3  I have placed an order for a chest x-ray, you can go to [facility] imaging as a walk-in appointment and have that done today.  I will also place a referral to pulmonology for you.

## 2023-09-16 NOTE — Assessment & Plan Note (Signed)
Discussed that benzodiazepines like lorazepam should generally be avoided for anyone over 85 years old due to concern over side effects and risk of dependence.  Discussed that it is important to use only as needed, patient verbalized understanding and is agreeable to this plan.

## 2023-09-20 ENCOUNTER — Ambulatory Visit: Payer: Medicare HMO | Attending: Physician Assistant | Admitting: Physician Assistant

## 2023-09-20 ENCOUNTER — Encounter: Payer: Self-pay | Admitting: Physician Assistant

## 2023-09-20 DIAGNOSIS — I5032 Chronic diastolic (congestive) heart failure: Secondary | ICD-10-CM | POA: Diagnosis not present

## 2023-09-20 DIAGNOSIS — R0789 Other chest pain: Secondary | ICD-10-CM

## 2023-09-20 DIAGNOSIS — K9289 Other specified diseases of the digestive system: Secondary | ICD-10-CM

## 2023-09-20 DIAGNOSIS — E46 Unspecified protein-calorie malnutrition: Secondary | ICD-10-CM

## 2023-09-20 NOTE — Patient Instructions (Signed)
Medication Instructions:  NO CHANGES *If you need a refill on your cardiac medications before your next appointment, please call your pharmacy*   Lab Work: NO LABS If you have labs (blood work) drawn today and your tests are completely normal, you will receive your results only by: MyChart Message (if you have MyChart) OR A paper copy in the mail If you have any lab test that is abnormal or we need to change your treatment, we will call you to review the results.   Testing/Procedures: NO TESTING   Follow-Up: At [facility], you and your health needs are our priority.  As part of our continuing mission to provide you with exceptional heart care, we have created designated Provider Care Teams.  These Care Teams include your primary Cardiologist (physician) and Advanced Practice Providers (APPs -  Physician Assistants and Nurse Practitioners) who all work together to provide you with the care you need, when you need it.  Your next appointment:   KEEP FOLLOW UP January 05 2024  Provider:   Chrystie Nose, MD

## 2023-09-20 NOTE — Progress Notes (Unsigned)
Cardiology Office Note:  .   Date:  09/21/2023  ID:  Andrea Brooks, DOB [birth year], MRN [id number] PCP: Melida Quitter, PA  [facility] Providers Cardiologist:  Chrystie Nose, MD     History of Present Illness: .   Andrea Brooks is a 87 y.o. female with PMH of HTN, HLD, DM II, CAD, anxiety, GERD and a history of chronic diastolic heart failure.  Patient previously was admitted for persistent diarrhea due to E. coli infection.  She subsequently received large amount of hydration and developed acute diastolic heart failure.  She required diuresis and was seen by Dr. Mayford Knife and Dr. Duke Salvia.  She was noted to have elevated troponin and referred for cardiac catheterization on 07/04/2018 which showed mild nonobstructive CAD, 25% proximal LAD, 10% mid RCA, 10% proximal to mid left circumflex lesion.  Echocardiogram obtained on 07/03/2018 demonstrated EF 60 to 65%, moderate LVH, no regional wall motion abnormality.  She later established with Dr. Rennis Golden who is also her husband's cardiologist.  She is being followed by GI service for gastrointestinal dysmotility issue.  She is being followed by [facility] and on pyridostigmine bromide daily to slow down her bowel.  To prevent bacterial overgrowth from slow bowel, she has been taking Flagyl.  Patient was last seen by Dr. Rennis Golden in November 2023 at which time she was doing well.     Since the last visit, patient was seen in the ED on 07/22/2023 due to nonspecific chest pain that was going on for a week.  Symptom worsened when she take a deep breath.  Serial troponin 57--> 55.  Chest x-ray showed underinflation of the lungs with some interstitial changes which is chronic, tiny left pleural effusion.  CTA of the chest shows no PE, cardiomegaly with mild interstitial pulmonary edema and a small left pleural effusion, dilated ascending aorta measuring at 4.3 cm, aortic atherosclerosis with coronary artery calcification.  COVID and influenza test  negative.  Her symptom was suspected to be musculoskeletal in nature.  She was readmitted on 08/04/2023 was tingling and numbness in the extremities.  On arrival, white blood cell count was elevated 25,000.  Potassium low at 3.  Troponin elevated at 93.  Due to her GI condition at baseline, she has very poor appetite.  Recently her Flagyl was increased to 500 mg twice a day but she stopped taking it as she develop tingling numbness.  She was found to have acute on chronic diastolic heart failure on exam, moderate protein calorie malnutrition, hypomagnesemia, hypocalcemia.  She was treated with IV Lasix.  Echocardiogram obtained on 08/05/2023 showed EF 60 to 65%, mild LVH, grade 2 DD, normal RV, mild MR, dilated ascending aorta measuring at 42 mm.  She was treated with empiric antibiotic for possible right lower lobe pneumonia on admission.  I last saw the patient on 08/11/2023 accompanied by daughter and husband.  Her breathing was stable.  She did receive oxygen therapy going home.  However she was not really using the oxygen therapy.  Family was going to get her pulse oximeter.  Repeat blood work showed stable renal function and electrolyte.  Normal red blood cell count.  Recently, she was seen by her PCP, her magnesium has dropped from 1.6 down to 1.0.  Her PCP has started her on some oral magnesium which she could not tolerate.  She says as soon as she take the magnesium, she felt her abdomen was quite bloated and this was followed by intense vomiting  and some diarrhea.  She continued to have frequent bowel movement, she had 6 bowel movement today alone.  With her severely low magnesium, she is quite concerned that she could not tolerate the oral magnesium even at a reduced dose.  She does remember she can tolerate the IV magnesium.  I have called the IV infusion center on the [address] they are unable to deliver IV magnesium over there.  I will call the [facility] infusion center tomorrow [phone]) to  see if I can order IV magnesium for her.  ROS:   Patient denies any chest pain or shortness of breath, however does complain of chronic diarrhea and a GI issue.  Studies Reviewed: .        Cardiac Studies & Procedures   CARDIAC CATHETERIZATION  CARDIAC CATHETERIZATION 07/04/2018  Narrative  Prox LAD lesion is 25% stenosed.  Mid RCA lesion is 10% stenosed.  Prox Cx to Mid Cx lesion is 10% stenosed.  LV end diastolic pressure is mildly elevated. LVEDP 16 mm Hg.  There is no aortic valve stenosis.  Nonobstructive CAD.  Continue aggressive secondary prevention and management of diastolic heart failure.  Findings Coronary Findings Diagnostic  Dominance: Right  Left Anterior Descending Prox LAD lesion is 25% stenosed.  Left Circumflex Prox Cx to Mid Cx lesion is 10% stenosed.  Right Coronary Artery Mid RCA lesion is 10% stenosed.  Intervention  No interventions have been documented.     ECHOCARDIOGRAM  ECHOCARDIOGRAM COMPLETE 08/05/2023  Narrative ECHOCARDIOGRAM REPORT    Patient Name:   Andrea Brooks Date of Exam: 08/05/2023 Medical Rec #:  [id number]      Height:       63.0 in Accession #:    [id number]     Weight:       136.9 lb Date of Birth:  1936-10-18       BSA:          1.646 m Patient Age:    87 years       BP:           161/68 mmHg Patient Gender: F              HR:           75 bpm. Exam Location:  Inpatient  Procedure: 2D Echo, Cardiac Doppler and Color Doppler  Indications:    Abnormal ECG 794.31 / R94.31 Elevated Troponin  History:        Patient has prior history of Echocardiogram examinations, most recent 07/03/2018. CHF, Previous Myocardial Infarction; Risk Factors:Diabetes, Dyslipidemia and Former Smoker.  Sonographer:    Dondra Prader RVT Referring Phys: [id number] [facility]  IMPRESSIONS   1. Left ventricular ejection fraction, by estimation, is 60 to 65%. The left ventricle has normal function. The left ventricle has no regional  wall motion abnormalities. There is mild concentric left ventricular hypertrophy. Left ventricular diastolic parameters are consistent with Grade II diastolic dysfunction (pseudonormalization). 2. Right ventricular systolic function is normal. The right ventricular size is normal. 3. Left atrial size was moderately dilated. 4. The mitral valve is normal in structure. Mild mitral valve regurgitation. No evidence of mitral stenosis. 5. The aortic valve is tricuspid. There is mild calcification of the aortic valve. Aortic valve regurgitation is not visualized. Aortic valve sclerosis/calcification is present, without any evidence of aortic stenosis. 6. Aortic dilatation noted. There is mild dilatation of the ascending aorta, measuring 42 mm. 7. The inferior vena cava is normal in size  with greater than 50% respiratory variability, suggesting right atrial pressure of 3 mmHg.  FINDINGS Left Ventricle: Left ventricular ejection fraction, by estimation, is 60 to 65%. The left ventricle has normal function. The left ventricle has no regional wall motion abnormalities. The left ventricular internal cavity size was normal in size. There is mild concentric left ventricular hypertrophy. Left ventricular diastolic parameters are consistent with Grade II diastolic dysfunction (pseudonormalization).  Right Ventricle: The right ventricular size is normal. No increase in right ventricular wall thickness. Right ventricular systolic function is normal.  Left Atrium: Left atrial size was moderately dilated.  Right Atrium: Right atrial size was normal in size.  Pericardium: There is no evidence of pericardial effusion.  Mitral Valve: The mitral valve is normal in structure. Mild mitral annular calcification. Mild mitral valve regurgitation. No evidence of mitral valve stenosis.  Tricuspid Valve: The tricuspid valve is normal in structure. Tricuspid valve regurgitation is mild . No evidence of tricuspid  stenosis.  Aortic Valve: The aortic valve is tricuspid. There is mild calcification of the aortic valve. Aortic valve regurgitation is not visualized. Aortic valve sclerosis/calcification is present, without any evidence of aortic stenosis. Aortic valve mean gradient measures 3.0 mmHg. Aortic valve peak gradient measures 5.6 mmHg. Aortic valve area, by VTI measures 2.28 cm.  Pulmonic Valve: The pulmonic valve was normal in structure. Pulmonic valve regurgitation is mild. No evidence of pulmonic stenosis.  Aorta: Aortic dilatation noted. There is mild dilatation of the ascending aorta, measuring 42 mm.  Venous: The inferior vena cava is normal in size with greater than 50% respiratory variability, suggesting right atrial pressure of 3 mmHg.  IAS/Shunts: No atrial level shunt detected by color flow Doppler.   LEFT VENTRICLE PLAX 2D LVIDd:         4.90 cm   Diastology LVIDs:         3.60 cm   LV e' medial:    5.23 cm/s LV PW:         1.20 cm   LV E/e' medial:  20.8 LV IVS:        1.20 cm   LV e' lateral:   7.54 cm/s LVOT diam:     1.90 cm   LV E/e' lateral: 14.5 LV SV:         58 LV SV Index:   35 LVOT Area:     2.84 cm   RIGHT VENTRICLE             IVC RV Basal diam:  3.30 cm     IVC diam: 1.50 cm RV S prime:     13.10 cm/s TAPSE (M-mode): 2.1 cm  LEFT ATRIUM             Index        RIGHT ATRIUM           Index LA diam:        4.50 cm 2.73 cm/m   RA Area:     14.00 cm LA Vol (A2C):   64.4 ml 39.10 ml/m  RA Volume:   30.50 ml  18.53 ml/m LA Vol (A4C):   72.0 ml 43.74 ml/m LA Biplane Vol: 72.8 ml 44.23 ml/m AORTIC VALVE                    PULMONIC VALVE AV Area (Vmax):    2.27 cm     PV Vmax:          1.20 m/s AV Area (Vmean):  2.29 cm     PV Peak grad:     5.8 mmHg AV Area (VTI):     2.28 cm     PR End Diast Vel: 6.25 msec AV Vmax:           118.00 cm/s AV Vmean:          79.900 cm/s AV VTI:            0.252 m AV Peak Grad:      5.6 mmHg AV Mean Grad:      3.0  mmHg LVOT Vmax:         94.40 cm/s LVOT Vmean:        64.500 cm/s LVOT VTI:          0.203 m LVOT/AV VTI ratio: 0.81  AORTA Ao Root diam: 3.40 cm Ao Asc diam:  4.20 cm  MITRAL VALVE                TRICUSPID VALVE MV Area (PHT): 4.06 cm     TR Peak grad:   22.5 mmHg MV Decel Time: 187 msec     TR Vmax:        237.00 cm/s MV E velocity: 109.00 cm/s MV A velocity: 100.00 cm/s  SHUNTS MV E/A ratio:  1.09         Systemic VTI:  0.20 m Systemic Diam: 1.90 cm  Arvilla Meres MD Electronically signed by Arvilla Meres MD Signature Date/Time: 08/05/2023/2:23:26 PM    Final             Risk Assessment/Calculations:             Physical Exam:   VS:  BP 98/64 (BP Location: Left Arm, Patient Position: Sitting, Cuff Size: Normal)   Pulse 71   Ht 5' 2.5" (1.588 m)   Wt 133 lb 6.4 oz (60.5 kg)   BMI 24.01 kg/m    Wt Readings from Last 3 Encounters:  09/20/23 133 lb 6.4 oz (60.5 kg)  09/16/23 130 lb (59 kg)  08/13/23 137 lb 12.8 oz (62.5 kg)    GEN: Well nourished, well developed in no acute distress NECK: No JVD; No carotid bruits CARDIAC: RRR, no murmurs, rubs, gallops RESPIRATORY:  Clear to auscultation without rales, wheezing or rhonchi  ABDOMEN: Soft, non-tender, non-distended EXTREMITIES:  No edema; No deformity   ASSESSMENT AND PLAN: .    Hypomagnesemia Persistent low magnesium levels despite oral supplementation, likely secondary to chronic GI issues and frequent vomiting. Oral magnesium supplementation poorly tolerated, causing GI distress and vomiting. -Explore options for IV magnesium infusion, potentially at local infusion centers or through oncology service. -Check magnesium levels after infusion and monitor regularly (potentially monthly) to ensure levels remain within normal range.  Chronic Diastolic Heart Failure Stable with no signs of volume overload. Patient's weight has remained steady. -Continue current heart failure management. -Advise patient to  continue monitoring weight for signs of fluid retention.  Gastrointestinal Dysmotility Chronic issue causing frequent bowel movements and contributing to malabsorption of oral medications. Currently on Mestinon and Miralax. -Continue current GI management under GI service. -Communicate with primary care provider regarding magnesium management and potential need for changes in GI medication regimen.  Musculoskeletal Chest Pain Previously evaluated in ED with negative workup for cardiac etiology. -No further action required at this time. -History of mild nonobstructive CAD on previous cardiac catheterization in 2019  Protein Calorie Malnutrition Likely secondary to poor appetite and chronic GI issues. -Continue monitoring nutritional status. -Consider  consultation with a dietitian if malnutrition persists or worsens.        Dispo: Follow-up with Dr. Rennis Golden  Signed, Azalee Course, [place]

## 2023-09-21 ENCOUNTER — Telehealth: Payer: Self-pay | Admitting: Physician Assistant

## 2023-09-21 ENCOUNTER — Other Ambulatory Visit: Payer: Self-pay | Admitting: Physician Assistant

## 2023-09-21 ENCOUNTER — Other Ambulatory Visit (HOSPITAL_COMMUNITY): Payer: Self-pay | Admitting: *Deleted

## 2023-09-21 MED ORDER — MAGNESIUM SULFATE 50 % IJ SOLN
2.0000 g | Freq: Once | INTRAVENOUS | Status: DC
Start: 2023-09-21 — End: 2023-09-28

## 2023-09-21 NOTE — Telephone Encounter (Signed)
I have called and spoke with infusion center on [address] they are unable to do magnesium infusion.  I spoke with infusion center at [facility], they are able to give the magnesium infusion.  I updated PCP and the patient, patient will contact [place] infusion center to schedule a time for the IV magnesium infusion.  She has significant GI issue and have problems tolerating the oral magnesium.  Recent blood work shows her magnesium level has dropped from 1.6 last month to 1.0.  I informed the patient that 2 days after she completed her IV magnesium appointment, she should come back to our office to have a repeat blood work.  If magnesium level still low, I may ended up ordering another dose of IV magnesium.

## 2023-09-23 ENCOUNTER — Other Ambulatory Visit: Payer: Self-pay | Admitting: Gastroenterology

## 2023-09-23 DIAGNOSIS — K219 Gastro-esophageal reflux disease without esophagitis: Secondary | ICD-10-CM

## 2023-09-24 ENCOUNTER — Ambulatory Visit (HOSPITAL_COMMUNITY)
Admission: RE | Admit: 2023-09-24 | Discharge: 2023-09-24 | Disposition: A | Discharge status: Home / Self Care | Payer: Medicare HMO | Source: Ambulatory Visit | Attending: Physician Assistant

## 2023-09-24 MED ORDER — MAGNESIUM SULFATE 2 GM/50ML IV SOLN
2.0000 g | Freq: Once | INTRAVENOUS | Status: AC
  Administered 2023-09-24: 2 g via INTRAVENOUS
  Filled 2023-09-24: qty 50

## 2023-09-27 ENCOUNTER — Other Ambulatory Visit: Payer: Self-pay

## 2023-09-28 ENCOUNTER — Other Ambulatory Visit: Payer: Self-pay | Admitting: Physician Assistant

## 2023-09-28 ENCOUNTER — Telehealth: Payer: Self-pay | Admitting: *Deleted

## 2023-09-28 ENCOUNTER — Other Ambulatory Visit: Payer: Self-pay | Admitting: Family Medicine

## 2023-09-28 DIAGNOSIS — Z79899 Other long term (current) drug therapy: Secondary | ICD-10-CM

## 2023-09-28 DIAGNOSIS — E1159 Type 2 diabetes mellitus with other circulatory complications: Secondary | ICD-10-CM

## 2023-09-28 LAB — MAGNESIUM: Magnesium: 1.5 mg/dL — ABNORMAL LOW (ref 1.6–2.3)

## 2023-09-28 MED ORDER — MAGNESIUM SULFATE 50 % IJ SOLN: 2.0000 g | Freq: Once | INTRAVENOUS | Status: DC

## 2023-09-28 NOTE — Progress Notes (Signed)
She was instructed to draw it in my office so it will bounce back to me. Would you be ok with taking over after that and following magnesium in a month or so?

## 2023-09-28 NOTE — Progress Notes (Signed)
Spoke with Andrea Brooks over the phone. Significant improvement of magnesium level after the IV infusion. However mag is still borderline low, given her chronic GI issue and diarrhea, would recommend another IV mag infusion. She says she did great wit the IV infusion last Friday. I have placed order, she is instructed to call infusion clinic to schedule. She will need Magnesium lab 2 days after the infusion

## 2023-09-28 NOTE — Progress Notes (Signed)
I checked with our phlegbotomist, unfortunately, facility rule required the blood work obtained in our office to be ordered by [facility] heart care provider, so if you are ok with it, I will cancel your order and replace it with my own BMET order. I can review the result and forward it to you afterward as FYI.

## 2023-09-28 NOTE — Progress Notes (Signed)
That is fine with me, thank you so much for doing that!

## 2023-09-28 NOTE — Telephone Encounter (Signed)
LVM for pt to let her know that provider is going to send a message to cardiology to see if the can just order the CMP when they draw to check her magnesium again.

## 2023-09-28 NOTE — Progress Notes (Signed)
Will do. Order placed. Will obtain BMET on the same day as Mg. Will forward you the result after I review them.

## 2023-10-01 ENCOUNTER — Ambulatory Visit (HOSPITAL_COMMUNITY)
Admission: RE | Admit: 2023-10-01 | Discharge: 2023-10-01 | Disposition: A | Discharge status: Home / Self Care | Payer: Medicare HMO | Source: Ambulatory Visit | Attending: Physician Assistant | Admitting: Physician Assistant

## 2023-10-01 ENCOUNTER — Other Ambulatory Visit: Payer: Medicare HMO

## 2023-10-01 ENCOUNTER — Other Ambulatory Visit: Payer: Self-pay

## 2023-10-01 DIAGNOSIS — Z79899 Other long term (current) drug therapy: Secondary | ICD-10-CM

## 2023-10-01 MED ORDER — MAGNESIUM SULFATE 2 GM/50ML IV SOLN
2.0000 g | Freq: Once | INTRAVENOUS | Status: AC
  Administered 2023-10-01: 2 g via INTRAVENOUS
  Filled 2023-10-01: qty 50

## 2023-10-04 ENCOUNTER — Other Ambulatory Visit: Payer: Self-pay

## 2023-10-04 DIAGNOSIS — Z79899 Other long term (current) drug therapy: Secondary | ICD-10-CM

## 2023-10-05 LAB — MAGNESIUM: Magnesium: 1.5 mg/dL — ABNORMAL LOW (ref 1.6–2.3)

## 2023-10-05 LAB — COMPREHENSIVE METABOLIC PANEL
ALT: 13 [IU]/L (ref 0–32)
AST: 26 [IU]/L (ref 0–40)
Albumin: 3.7 g/dL (ref 3.7–4.7)
Alkaline Phosphatase: 93 [IU]/L (ref 44–121)
BUN/Creatinine Ratio: 19 (ref 12–28)
BUN: 19 mg/dL (ref 8–27)
Bilirubin Total: 0.2 mg/dL (ref 0.0–1.2)
CO2: 26 mmol/L (ref 20–29)
Calcium: 8.7 mg/dL (ref 8.7–10.3)
Chloride: 102 mmol/L (ref 96–106)
Creatinine, Ser: 0.99 mg/dL (ref 0.57–1.00)
Globulin, Total: 3.2 g/dL (ref 1.5–4.5)
Glucose: 97 mg/dL (ref 70–99)
Potassium: 4.4 mmol/L (ref 3.5–5.2)
Sodium: 141 mmol/L (ref 134–144)
Total Protein: 6.9 g/dL (ref 6.0–8.5)
eGFR: 56 mL/min/{1.73_m2} — ABNORMAL LOW (ref >59)

## 2023-10-06 ENCOUNTER — Ambulatory Visit: Payer: Medicare HMO | Admitting: General Practice

## 2023-10-06 ENCOUNTER — Other Ambulatory Visit: Payer: Self-pay | Admitting: Physician Assistant

## 2023-10-06 MED ORDER — MAGNESIUM SULFATE 50 % IJ SOLN: 2.0000 g | Freq: Once | INTRAVENOUS | Status: DC

## 2023-10-12 ENCOUNTER — Other Ambulatory Visit: Payer: Self-pay | Admitting: Internal Medicine

## 2023-10-12 ENCOUNTER — Other Ambulatory Visit: Payer: Self-pay | Admitting: Physician Assistant

## 2023-10-12 ENCOUNTER — Other Ambulatory Visit (HOSPITAL_COMMUNITY): Payer: Self-pay | Admitting: *Deleted

## 2023-10-12 MED ORDER — MAGNESIUM SULFATE 50 % IJ SOLN: 2.0000 g | Freq: Once | INTRAVENOUS | Status: DC

## 2023-10-14 ENCOUNTER — Encounter (HOSPITAL_COMMUNITY)
Admission: RE | Admit: 2023-10-14 | Discharge: 2023-10-14 | Disposition: A | Discharge status: Home / Self Care | Payer: Medicare HMO | Source: Ambulatory Visit | Attending: Family Medicine | Admitting: Family Medicine

## 2023-10-14 DIAGNOSIS — R062 Wheezing: Secondary | ICD-10-CM | POA: Diagnosis present

## 2023-10-14 DIAGNOSIS — R052 Subacute cough: Secondary | ICD-10-CM | POA: Insufficient documentation

## 2023-10-14 MED ORDER — MAGNESIUM SULFATE 50 % IJ SOLN
2.0000 g | Freq: Once | INTRAVENOUS | Status: DC
  Filled 2023-10-14: qty 4

## 2023-10-14 MED ORDER — MAGNESIUM SULFATE 2 GM/50ML IV SOLN
2.0000 g | Freq: Once | INTRAVENOUS | Status: AC
  Administered 2023-10-14: 2 g via INTRAVENOUS
  Filled 2023-10-14: qty 50

## 2023-10-18 ENCOUNTER — Other Ambulatory Visit: Payer: Self-pay

## 2023-10-18 DIAGNOSIS — E1159 Type 2 diabetes mellitus with other circulatory complications: Secondary | ICD-10-CM

## 2023-10-18 DIAGNOSIS — Z79899 Other long term (current) drug therapy: Secondary | ICD-10-CM

## 2023-10-19 LAB — BASIC METABOLIC PANEL
BUN/Creatinine Ratio: 24 (ref 12–28)
BUN: 24 mg/dL (ref 8–27)
CO2: 26 mmol/L (ref 20–29)
Calcium: 8.9 mg/dL (ref 8.7–10.3)
Chloride: 100 mmol/L (ref 96–106)
Creatinine, Ser: 1.02 mg/dL — ABNORMAL HIGH (ref 0.57–1.00)
Glucose: 91 mg/dL (ref 70–99)
Potassium: 5.1 mmol/L (ref 3.5–5.2)
Sodium: 137 mmol/L (ref 134–144)
eGFR: 54 mL/min/{1.73_m2} — ABNORMAL LOW (ref >59)

## 2023-10-19 LAB — MAGNESIUM: Magnesium: 1.5 mg/dL — ABNORMAL LOW (ref 1.6–2.3)

## 2023-10-25 ENCOUNTER — Telehealth: Payer: Self-pay | Admitting: Physician Assistant

## 2023-10-25 NOTE — Telephone Encounter (Signed)
Called and spoke to patient. Below message relayed. Patient report she was still having vomiting and diarrhea. She stated the morning after Thanksgiving was the worse. She report she was having black slimy stools and black emesis every 2 1/2 hours until late that night. She hasn't had any nausea or vomiting since Friday night and they are back to normal color and consistency. She was feeling weak on Saturday but better today and deny any other symptoms at this time. Advised patient to get plenty of fluid to stay hydrated. Patient verbalized understanding and agree.  [place], [place] 10/20/2023 10:23 AM EST   Stable renal function. Magnesium level remain borderline low at 1.5, normal is 1.6 or above. [place]any, please check with patient to see if she is still have GI loss from either diarrhea or nausea/vomiting.

## 2023-10-25 NOTE — Telephone Encounter (Signed)
Patient is calling requesting her 11/25 lab results. Please advise.

## 2023-10-29 ENCOUNTER — Other Ambulatory Visit: Payer: Self-pay | Admitting: Physician Assistant

## 2023-10-29 MED ORDER — MAGNESIUM SULFATE 50 % IJ SOLN: 2.0000 g | Freq: Once | INTRAVENOUS | Status: DC

## 2023-10-29 NOTE — Telephone Encounter (Signed)
I called and spoke with Andrea Brooks directly. I have placed order for IV magnesium infusion and Mg blood work afterward. She is aware to contact infusion clinic on Monday to set it up.

## 2023-10-29 NOTE — Telephone Encounter (Signed)
Patient daughter called to follow up on call, she wants to know if her mother still needs to get a magnesium infusion.

## 2023-10-29 NOTE — Telephone Encounter (Signed)
Please advise if patient still need magnesium infusion.

## 2023-11-01 ENCOUNTER — Other Ambulatory Visit: Payer: Self-pay

## 2023-11-01 ENCOUNTER — Other Ambulatory Visit: Payer: Self-pay | Admitting: Family Medicine

## 2023-11-01 DIAGNOSIS — E1159 Type 2 diabetes mellitus with other circulatory complications: Secondary | ICD-10-CM

## 2023-11-03 ENCOUNTER — Other Ambulatory Visit (HOSPITAL_COMMUNITY): Payer: Self-pay | Admitting: *Deleted

## 2023-11-03 ENCOUNTER — Other Ambulatory Visit: Payer: Self-pay | Admitting: Physician Assistant

## 2023-11-03 MED ORDER — MAGNESIUM SULFATE 50 % IJ SOLN: 2.0000 g | Freq: Once | INTRAVENOUS | Status: DC

## 2023-11-04 ENCOUNTER — Encounter (HOSPITAL_COMMUNITY)
Admission: RE | Admit: 2023-11-04 | Discharge: 2023-11-04 | Disposition: A | Discharge status: Home / Self Care | Payer: Medicare HMO | Source: Ambulatory Visit | Attending: Family Medicine | Admitting: Family Medicine

## 2023-11-04 DIAGNOSIS — R052 Subacute cough: Secondary | ICD-10-CM | POA: Diagnosis not present

## 2023-11-04 DIAGNOSIS — R062 Wheezing: Secondary | ICD-10-CM | POA: Insufficient documentation

## 2023-11-04 MED ORDER — MAGNESIUM SULFATE 50 % IJ SOLN
2.0000 g | Freq: Once | INTRAVENOUS | Status: AC
  Administered 2023-11-04: 2 g via INTRAVENOUS
  Filled 2023-11-04: qty 4

## 2023-11-08 LAB — BASIC METABOLIC PANEL
BUN/Creatinine Ratio: 15 (ref 12–28)
BUN: 12 mg/dL (ref 8–27)
CO2: 24 mmol/L (ref 20–29)
Calcium: 7.8 mg/dL — ABNORMAL LOW (ref 8.7–10.3)
Chloride: 105 mmol/L (ref 96–106)
Creatinine, Ser: 0.8 mg/dL (ref 0.57–1.00)
Glucose: 119 mg/dL — ABNORMAL HIGH (ref 70–99)
Potassium: 3.9 mmol/L (ref 3.5–5.2)
Sodium: 141 mmol/L (ref 134–144)
eGFR: 72 mL/min/{1.73_m2} (ref >59)

## 2023-11-19 ENCOUNTER — Encounter: Payer: Self-pay | Admitting: Family Medicine

## 2023-11-22 ENCOUNTER — Other Ambulatory Visit: Payer: Self-pay

## 2023-11-23 ENCOUNTER — Other Ambulatory Visit: Payer: Self-pay

## 2023-11-23 ENCOUNTER — Other Ambulatory Visit: Payer: Self-pay | Admitting: Physician Assistant

## 2023-11-23 ENCOUNTER — Other Ambulatory Visit (HOSPITAL_COMMUNITY): Payer: Self-pay

## 2023-11-23 ENCOUNTER — Telehealth: Payer: Self-pay | Admitting: Internal Medicine

## 2023-11-23 ENCOUNTER — Telehealth: Payer: Self-pay

## 2023-11-23 DIAGNOSIS — Z79899 Other long term (current) drug therapy: Secondary | ICD-10-CM

## 2023-11-23 LAB — MAGNESIUM: Magnesium: 1 mg/dL — ABNORMAL LOW (ref 1.6–2.3)

## 2023-11-23 MED ORDER — MAGNESIUM SULFATE 50 % IJ SOLN: 2.0000 g | INTRAVENOUS | Status: AC

## 2023-11-23 NOTE — Progress Notes (Signed)
Check Magnesium lab in 3 weeks

## 2023-11-23 NOTE — Telephone Encounter (Signed)
  Patient returning call regarding results 

## 2023-11-23 NOTE — Progress Notes (Signed)
I just placed another IV order, this time, I placed it as twice a week for 6 doses, hopefully this will make it easier.

## 2023-11-23 NOTE — Progress Notes (Signed)
Will defer recommendation to PCP, what we can do on our end is to make sure she gets the twice a week Magnesium infusion prior to that cruise to make sure her level come back to normal. But ultimately her issue is still diarrhea and vomiting, that's something PCP and GI are working on

## 2023-11-23 NOTE — Telephone Encounter (Addendum)
 Called patient regarding results and upcoming lab request, patient verbalized understanding and would like to know what she should do when she leaves for cruise the end of January being she wont be able to have transfusion then, let patient know I would see what provider advise and get back with her   ----- Message from Scot Ford sent at 11/23/2023  8:01 AM EST ----- Mag is very low again. There was a issue with Mag infusion order, everytime I place IV Mag order, infusion clinic can't see it and ended up Chat Message me in EPIC, and I ended up place it again. With Mag as low as 1.0, she will need twice a week Mag  infusion. Please let the patient know, I will place another IV mag infusion order, patient knows how to call infusion clinic and arrange the appt. But just let the patient know that infusion clinic has been messaging me separately to place another o rder because somehow they can't see my original order even though I place both order the same way each time.

## 2023-11-23 NOTE — Telephone Encounter (Signed)
 Andrea Brooks, see my separate message from lab result

## 2023-12-02 ENCOUNTER — Ambulatory Visit (HOSPITAL_COMMUNITY)
Admission: RE | Admit: 2023-12-02 | Discharge: 2023-12-02 | Disposition: A | Discharge status: Home / Self Care | Payer: Medicare HMO | Source: Ambulatory Visit | Attending: Physician Assistant | Admitting: Physician Assistant

## 2023-12-02 MED ORDER — MAGNESIUM SULFATE 2 GM/50ML IV SOLN
2.0000 g | INTRAVENOUS | Status: DC
  Administered 2023-12-02: 2 g via INTRAVENOUS
  Filled 2023-12-02 (×2): qty 50

## 2023-12-07 ENCOUNTER — Ambulatory Visit (HOSPITAL_COMMUNITY)
Admission: RE | Admit: 2023-12-07 | Discharge: 2023-12-07 | Disposition: A | Discharge status: Home / Self Care | Payer: Medicare HMO | Source: Ambulatory Visit | Attending: Physician Assistant | Admitting: Physician Assistant

## 2023-12-07 MED ORDER — MAGNESIUM SULFATE 2 GM/50ML IV SOLN
2.0000 g | INTRAVENOUS | Status: DC
  Administered 2023-12-07: 2 g via INTRAVENOUS
  Filled 2023-12-07: qty 50

## 2023-12-09 ENCOUNTER — Other Ambulatory Visit: Payer: Self-pay

## 2023-12-09 DIAGNOSIS — Z79899 Other long term (current) drug therapy: Secondary | ICD-10-CM

## 2023-12-10 ENCOUNTER — Telehealth: Payer: Self-pay

## 2023-12-10 LAB — MAGNESIUM: Magnesium: 1.6 mg/dL (ref 1.6–2.3)

## 2023-12-10 NOTE — Telephone Encounter (Addendum)
Called patient regarding results, patient verbalized understanding said she will be going on a cruise trip soon and wont be able to do infusion but as soon as she gets back she will call infusion clinic to set up appointment to resume infusion plan.  ----- Message from Azalee Course sent at 12/10/2023  8:11 AM EST ----- Magnesium now within normal range. Continue with plan for twice weekly infusion

## 2023-12-10 NOTE — Progress Notes (Signed)
Magnesium now within normal range. Continue with plan for twice weekly infusion

## 2023-12-16 NOTE — Progress Notes (Signed)
I saw and evaluated the patient, participating in the key portions of the service.  I reviewed the resident???s note.  I agree with the resident???s findings and plan. Nyasia Baxley T Evangelyn Crouse, MD

## 2023-12-29 ENCOUNTER — Other Ambulatory Visit: Payer: Self-pay | Admitting: Physician Assistant

## 2023-12-29 ENCOUNTER — Telehealth: Payer: Self-pay | Admitting: Internal Medicine

## 2023-12-29 MED ORDER — MAGNESIUM SULFATE 50 % IJ SOLN: 2.0000 g | INTRAVENOUS | Status: AC

## 2023-12-29 NOTE — Telephone Encounter (Signed)
 Patient called to say that she needs a order to be sent over to be able to keep getting the infusion. Please advise

## 2023-12-29 NOTE — Telephone Encounter (Signed)
 Done, I placed it as twice weekly infusion. Please inform Andrea Brooks, she knows where to call to schedule it

## 2023-12-29 NOTE — Telephone Encounter (Signed)
 Called and spoke to patient. Verified name and DOB. Relayed below message to patient per Hao Meng. Patient verbalized understanding and agree. Patient stated she will call and schedule next infusion appt.  Janene Boer, [place]  Physician Assistant Cardiology  Done, I placed it as twice weekly infusion. Please inform Mrs. Fulcher, she knows where to call to schedule it

## 2023-12-30 ENCOUNTER — Other Ambulatory Visit (HOSPITAL_COMMUNITY): Payer: Self-pay | Admitting: *Deleted

## 2023-12-30 ENCOUNTER — Encounter: Payer: Self-pay | Admitting: Family Medicine

## 2023-12-31 ENCOUNTER — Ambulatory Visit (INDEPENDENT_AMBULATORY_CARE_PROVIDER_SITE_OTHER): Payer: Medicare HMO | Admitting: Family Medicine

## 2023-12-31 ENCOUNTER — Encounter: Payer: Self-pay | Admitting: Family Medicine

## 2023-12-31 VITALS — BP 138/77 | HR 64 | Ht 62.5 in | Wt 137.1 lb

## 2023-12-31 DIAGNOSIS — J988 Other specified respiratory disorders: Secondary | ICD-10-CM | POA: Diagnosis not present

## 2023-12-31 MED ORDER — HYDROCODONE BIT-HOMATROP MBR 5-1.5 MG/5ML PO SOLN: 5.0000 mL | Freq: Every evening | ORAL | 0 refills | Status: DC | PRN

## 2023-12-31 MED ORDER — GUAIFENESIN-DM 100-10 MG/5ML PO SYRP: 5.0000 mL | ORAL_SOLUTION | ORAL | 0 refills | Status: DC | PRN

## 2023-12-31 NOTE — Progress Notes (Signed)
  Acute Office Visit  Subjective:     Patient ID: Andrea Brooks, female    DOB: 1936-06-12, 88 y.o.   MRN: [id number]  Chief Complaint  Patient presents with   Cough    HPI Patient is in today for URI symptoms  Patient was recently on a cruise with her husband.  I saw her husband earlier this week for similar symptoms.  Both then developed respiratory infections and were treated on the cruise with antibiotics.  Patient still complaining of cough at this time.  It is keeping her up at night.  She is also developing a headache when she coughs.  No headache when not coughing.  ROS      Objective:    BP 138/77   Pulse 64   Ht 5' 2.5 (1.588 m)   Wt 137 lb 1.9 oz (62.2 kg)   SpO2 100%   BMI 24.68 kg/m    Physical Exam General: Alert and oriented CV: Regular rhythm Pulmonary: Lungs clear bilaterally no wheezes or crackles.  Patient coughing.  No respiratory distress.  No results found for any visits on 12/31/23.      Assessment & Plan:   Respiratory infection Assessment & Plan: Patient and her husband both developed respiratory infection on their cruise.  She has received antibiotics Augmentin and azithromycin .  Currently taking guaifenesin  and dextromethorphan syrup as needed.  Complaint today is persistent cough and headache associated with cough, inability to sleep due to cough.  On exam lungs sound good but she is coughing throughout the encounter.  No tachypnea or respiratory distress and O2 sat was 100%.  Will refill her guaifenesin /dextromethorphan and add Hycodan for nocturnal cough.  Advised not to use it during the day and advised not to take Ativan  at the same day as Hycodan.  She has not taken the Ativan  several weeks at least.   Other orders -     HYDROcodone  Bit-Homatrop MBr; Take 5 mLs by mouth at bedtime as needed for cough.  Dispense: 120 mL; Refill: 0 -     guaiFENesin -DM; Take 5 mLs by mouth every 4 (four) hours as needed for cough.  Dispense: 118 mL;  Refill: 0     Return if symptoms worsen or fail to improve.  Toribio MARLA Slain, MD

## 2023-12-31 NOTE — Patient Instructions (Signed)
 It was nice to see you today,  We addressed the following topics today: -I have prescribed the same cough syrup that your husband was prescribed.  Use this only at night because it contains a medication that can make you drowsy.  Also do not take this medication and your Ativan  on the same day. - The other medication I prescribed is the same cough medication you are currently taking.  Take it every 4 hours as needed. - Let us  know if you are not getting better by next week.  Sometimes cough can linger for several weeks after an infection.  Have a great day,  Rolan Slain, MD

## 2023-12-31 NOTE — Assessment & Plan Note (Signed)
 Patient and her husband both developed respiratory infection on their cruise.  She has received antibiotics Augmentin and azithromycin .  Currently taking guaifenesin  and dextromethorphan syrup as needed.  Complaint today is persistent cough and headache associated with cough, inability to sleep due to cough.  On exam lungs sound good but she is coughing throughout the encounter.  No tachypnea or respiratory distress and O2 sat was 100%.  Will refill her guaifenesin /dextromethorphan and add Hycodan for nocturnal cough.  Advised not to use it during the day and advised not to take Ativan  at the same day as Hycodan.  She has not taken the Ativan  several weeks at least.

## 2024-01-03 ENCOUNTER — Encounter (HOSPITAL_COMMUNITY)
Admission: RE | Admit: 2024-01-03 | Discharge: 2024-01-03 | Disposition: A | Discharge status: Home / Self Care | Payer: Medicare HMO | Source: Ambulatory Visit | Attending: Family Medicine | Admitting: Family Medicine

## 2024-01-03 DIAGNOSIS — R062 Wheezing: Secondary | ICD-10-CM | POA: Insufficient documentation

## 2024-01-03 DIAGNOSIS — R052 Subacute cough: Secondary | ICD-10-CM | POA: Insufficient documentation

## 2024-01-03 MED ORDER — MAGNESIUM SULFATE 50 % IJ SOLN: 2.0000 g | INTRAMUSCULAR | Status: DC

## 2024-01-03 MED ORDER — MAGNESIUM SULFATE 2 GM/50ML IV SOLN
2.0000 g | Freq: Once | INTRAVENOUS | Status: DC
  Administered 2024-01-03: 2 g via INTRAVENOUS
  Filled 2024-01-03: qty 50

## 2024-01-05 ENCOUNTER — Ambulatory Visit: Payer: Medicare HMO | Attending: Internal Medicine | Admitting: Internal Medicine

## 2024-01-05 ENCOUNTER — Ambulatory Visit
Admission: RE | Admit: 2024-01-05 | Discharge: 2024-01-05 | Disposition: A | Discharge status: Home / Self Care | Payer: Medicare HMO | Source: Ambulatory Visit | Attending: Internal Medicine | Admitting: Internal Medicine

## 2024-01-05 ENCOUNTER — Encounter: Payer: Self-pay | Admitting: Internal Medicine

## 2024-01-05 VITALS — BP 136/68 | HR 61 | Ht 64.0 in | Wt 136.0 lb

## 2024-01-05 DIAGNOSIS — R053 Chronic cough: Secondary | ICD-10-CM

## 2024-01-05 DIAGNOSIS — I10 Essential (primary) hypertension: Secondary | ICD-10-CM | POA: Diagnosis not present

## 2024-01-05 DIAGNOSIS — I5033 Acute on chronic diastolic (congestive) heart failure: Secondary | ICD-10-CM

## 2024-01-05 LAB — CBC WITH DIFFERENTIAL/PLATELET

## 2024-01-05 MED ORDER — FUROSEMIDE 40 MG PO TABS: 40.0000 mg | ORAL_TABLET | Freq: Every day | ORAL | 3 refills | Status: DC

## 2024-01-05 NOTE — Progress Notes (Signed)
OFFICE NOTE  Chief Complaint:  Cough, shortness of breath  Primary Care Physician: Melida Quitter, PA  HPI:  Andrea Brooks is a 88 y.o. female with a past medial history significant for anxiety, type 2 diabetes, GERD, hypertension and dyslipidemia, who recently was admitted for persistent diarrhea, found to have an E. coli infection.  Subsequently after significant hydration she developed what was suspected to be acute diastolic congestive heart failure.  This required diuresis and she was seen by Dr. Mayford Knife and Dr. Duke Salvia.  She was noted to have elevated troponin was referred for heart catheterization which showed mild nonobstructive coronary disease.  As her husband is a patient of mine, she requested to follow-up with me.  She reports her diarrhea has improved significantly.  She saw Dr. Myrtie Neither, who is been working with her to treat that.  While hospitalized they recommended starting appropriate heart failure treatment.  An echo was performed which showed normal systolic function and mild diastolic dysfunction.  It was recommended she go on to low-dose metoprolol and that her Vytorin's be switched over to atorvastatin for better LDL reduction.  She was leery of making those changes and wanted to discuss it with me further.  She did see her primary care provider in follow-up in a repeat lipid profile was performed.  This was 2 weeks ago and demonstrated total cholesterol of 85, triglycerides 128, HDL 24 and LDL 35.  At this point, it is not likely that she needs to switch cholesterol medications, and the low numbers are most likely related to her diarrhea over the past several weeks.  I suspect they are in fact falsely lowered because of this.  Overall though she feels better, denying any worsening shortness of breath or chest pain.  Pressure is mildly elevated 148/82 today.  She says is been running in the 140s at home.  10/18/2018  Andrea Brooks is seen today in routine follow-up.  Overall  she is doing well.  She is down about 6 pounds since I last saw her.  Some of that was fluid related to her diastolic heart failure also after her significant E. coli infection she is not had much of an appetite and continues to have IBS-D symptoms.  Overall she denies any chest pain or worsening shortness of breath.  We did repeat some labs indicated a small increase in her lipid profile.  Total cholesterol now 120, triglycerides 162, HDL 31 and LDL 57.  She says she did have a decrease in metformin due to improving hemoglobin A1c is 5.6.  EKG today shows sinus rhythm with left anterior fascicular block at 78.  04/14/2020  Andrea Brooks returns for follow-up.  She has no specific complaints today.  Dr. Myrtie Neither for her IBS-D symptoms.  The profile was in December 2020 which showed total cholesterol 102, triglycerides 133, HDL 28 and LDL of 50.  EKG shows sinus rhythm with first-degree AV block, left anterior fascicular block and voltage criteria for LVH at 61.  04/08/2021  Andrea Brooks is seen today in follow-up.  Unfortunately she had an eventful April.  She was hospitalized and found to have what was concerning for pneumatosis on a CT scan.  She was having abdominal pain.  She ended up going to surgery urgently but was not found to have any ischemic bowel.  Ultimately she has been placed on Flagyl and is seemingly responding to antibiotic therapy.  She had also previously had a consultation up at the [facility] clinic and then  was directed to [facility].  Fortunately with all this she had no heart failure symptoms.  She seems to be stable on her current medications with good blood pressure control.  She denies any weight gain or edema.  EKG shows a sinus rhythm with first-degree AV block.  Her lipids in December were well controlled with total cholesterol of 88 and an LDL of 42.  10/05/2022  Andrea Brooks is seen today in follow-up.  Overall she seems to be doing well.  Blood pressure was a little elevated today  150/83.  EKG shows a sinus bradycardia with some PACs for which she is unaware of.  She did have recent lipids that showed her cholesterol has been well controlled.  Her total cholesterol is only 79 3 weeks ago with triglycerides 101, HDL 31 and LDL 29.  She is on combination therapy Vytorin 10/20 mg daily.  She has had a number of GI issues.  01/05/2024  Andrea Brooks is seen today in follow-up with her husband.  Is only on a cruise.  She was sick on the ship and saw a provider in the infirmary.  She was provided azithromycin and Augmentin and some cough medications.  COVID testing was negative.  Subsequently she followed up with her PCP.  She reports that she has not yet improved.  She is still coughing.  She reports shortness of breath and feeling like she is gasping for air when she lays back.  She has had increase her pillows to 2 at night to sleep.  She does have some chronic diastolic heart failure.  She reports being on furosemide 40 mg daily however he is close to running out of her medication.  She denies any lower extremity swelling.  She brought in several months of weights which show pretty stable weight and she has had no new edema.  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   Left forearm and Left forearm superior    Past Surgical History:  Procedure Laterality Date   ABDOMINAL HYSTERECTOMY     total   BREAST BIOPSY Left    neg   CHOLECYSTECTOMY     JOINT REPLACEMENT Bilateral    knee   LAPAROTOMY N/A 03/11/2021   Procedure: EXPLORATORY LAPAROTOMY;  Surgeon: Berna Bue, MD;  Location: [facility] OR;  Service:  General;  Laterality: N/A;   LEFT HEART CATH AND CORONARY ANGIOGRAPHY N/A 07/04/2018   Procedure: LEFT HEART CATH AND CORONARY ANGIOGRAPHY;  Surgeon: Corky Crafts, MD;  Location: [facility] INVASIVE CV LAB;  Service: Cardiovascular;  Laterality: N/A;   REPLACEMENT TOTAL KNEE BILATERAL      FAMHx:  Family History  Problem Relation Age of Onset   Hypertension Mother    Colon cancer Mother    Stroke Father    Heart failure Father    Stroke Sister    Stroke Brother    Luiz Blare' disease Daughter    Breast cancer Paternal Aunt    Graves' disease Sister    Bone cancer Sister    Breast cancer Maternal  Aunt    Esophageal cancer Neg Hx    Rectal cancer Neg Hx    Liver cancer Neg Hx     SOCHx:   reports that she quit smoking about 64 years ago. Her smoking use included cigarettes. She started smoking about 66 years ago. She has a 2 pack-year smoking history. She has been exposed to tobacco smoke. She has never used smokeless tobacco. She reports that she does not drink alcohol and does not use drugs.  ALLERGIES:  Allergies  Allergen Reactions   Tape Other (See Comments)    Skin tears easily.  OK with paper tape.   Magnesium-Containing Compounds Diarrhea and Nausea And Vomiting    Can take Vitamins w/ magnesium Does not tolerate po magnesium by itself   Flagyl [Metronidazole] Other (See Comments)    Neuropathy with 250mg  BID dosing.  OK to take 250mg  QD    ROS: Pertinent items noted in HPI and remainder of comprehensive ROS otherwise negative.  HOME MEDS: Current Outpatient Medications on File Prior to Visit  Medication Sig Dispense Refill   acetaminophen (TYLENOL) 500 MG tablet Take 1 tablet (500 mg total) by mouth every 8 (eight) hours as needed for moderate pain. 20 tablet 0   aspirin EC 81 MG tablet Take 81 mg by mouth daily.     calcium-vitamin D (OSCAL WITH D) 500-5 MG-MCG tablet Take 1 tablet by mouth 2 (two) times daily. 60 tablet 1   carvedilol (COREG) 6.25 MG tablet TAKE  1 TABLET TWICE A DAY WITH MEALS 180 tablet 3   furosemide (LASIX) 40 MG tablet Take 1 tablet (40 mg total) by mouth daily. 14 tablet 25   guaiFENesin-dextromethorphan (ROBITUSSIN DM) 100-10 MG/5ML syrup Take 5 mLs by mouth every 4 (four) hours as needed for cough. 118 mL 0   HYDROcodone bit-homatropine (HYCODAN) 5-1.5 MG/5ML syrup Take 5 mLs by mouth at bedtime as needed for cough. 120 mL 0   LORazepam (ATIVAN) 1 MG tablet Take 0.5-1 tablets (0.5-1 mg total) by mouth as needed for anxiety (maximum once per day). 30 tablet 1   Magnesium Gluconate 250 MG TABS Take 1-2 tablets (250-500 mg total) by mouth 2 (two) times daily. START 1 TAB DAILY, SLOWLY INCREASE AS TOLERATED. 60 tablet 3   Multiple Vitamins-Minerals (MULTI FOR HER 50+) TABS Take 1 tablet by mouth daily.     omeprazole (PRILOSEC) 20 MG capsule TAKE 1 CAPSULE DAILY 30 MINUTES BEFORE BREAKFAST 90 capsule 3   polyethylene glycol powder (GLYCOLAX/MIRALAX) 17 GM/SCOOP powder Take 8.5 g by mouth daily.     pyridostigmine (MESTINON) 60 MG tablet Take 60 mg by mouth daily.     simvastatin (ZOCOR) 20 MG tablet TAKE 1 TABLET AT BEDTIME 90 tablet 1   valsartan (DIOVAN) 320 MG tablet TAKE 1 TABLET DAILY 90 tablet 3   Vitamin D, Ergocalciferol, (DRISDOL) 1.25 MG (50000 UNIT) CAPS capsule Take 1 capsule (50,000 Units total) by mouth every 7 (seven) days. 12 capsule 0   Current Facility-Administered Medications on File Prior to Visit  Medication Dose Route Frequency Provider Last Rate Last Admin   magnesium sulfate 2 g in dextrose 5 % 250 mL IVPB  2 g Intravenous Once Azalee Course, PA       magnesium sulfate 2 g in dextrose 5 % 250 mL IVPB  2 g Intravenous Once Azalee Course, PA       magnesium sulfate 2 g in dextrose 5 % 250 mL IVPB  2 g Intravenous Once [place],  Hao, PA       magnesium sulfate 2 g in dextrose 5 % 250 mL IVPB  2 g Intravenous Once Azalee Course, [place]       magnesium sulfate 2 g in dextrose 5 % 250 mL IVPB  2 g Intravenous Once Azalee Course, [place]        magnesium sulfate 2 g in dextrose 5 % 250 mL IVPB  2 g Intravenous Once per day on Monday Thursday Azalee Course, [place]        LABS/IMAGING: No results found for this or any previous visit (from the past 48 hours). No results found.  LIPID PANEL:    Component Value Date/Time   CHOL 118 02/04/2023 0944   TRIG 164 (H) 02/04/2023 0944   HDL 30 (L) 02/04/2023 0944   CHOLHDL 3.9 02/04/2023 0944   CHOLHDL 3.5 07/04/2018 0526   VLDL 26 07/04/2018 0526   LDLCALC 60 02/04/2023 0944     WEIGHTS: Wt Readings from Last 3 Encounters:  01/05/24 136 lb (61.7 kg)  01/03/24 130 lb (59 kg)  12/31/23 137 lb 1.9 oz (62.2 kg)    VITALS: BP 136/68 (BP Location: Left Arm, Patient Position: Sitting)   Pulse 61   Ht 5\' 4"  (1.626 m)   Wt 136 lb (61.7 kg)   SpO2 (!) 67%   BMI 23.34 kg/m   EXAM: General appearance: alert and no distress Neck: no carotid bruit, no JVD, and thyroid not enlarged, symmetric, no tenderness/mass/nodules Lungs: rales RLL Heart: regular rate and rhythm, S1, S2 normal, no murmur, click, rub or gallop Abdomen: soft, non-tender; bowel sounds normal; no masses,  no organomegaly Extremities: extremities normal, atraumatic, no cyanosis or edema Pulses: 2+ and symmetric Skin: Skin color, texture, turgor normal. No rashes or lesions Neurologic: Grossly normal Psych: Pleasant  EKG: EKG Interpretation Date/Time:  Wednesday January 05 2024 08:48:31 EST Ventricular Rate:  61 PR Interval:  252 QRS Duration:  110 QT Interval:  462 QTC Calculation: 465 R Axis:   -72  Text Interpretation: Sinus rhythm with 1st degree A-V block with Premature atrial complexes with Abberant conduction Left axis deviation Moderate voltage criteria for LVH, may be normal variant ( R in aVL , Cornell product ) Inferior-posterior infarct , age undetermined Anterolateral infarct , age undetermined When compared with ECG of 11-Aug-2023 10:56, PAC's are new and previous LBBB is not noted Confirmed by Zoila Shutter [phone]) on 01/05/2024 8:58:34 AM    ASSESSMENT: Recent upper respiratory infection Suspect acute on chronic diastolic congestive heart failure History of acute diastolic congestive heart failure (EF 60 to 65%, 06/2018) Dyslipidemia Hypertension Mild nonobstructive coronary disease (cath 06/2018) Small bowel disease Pneumatosis Hypomagnesemia  PLAN: 1.   Mrs. Brewbaker recent upper respiratory infection and she has received good antibiotic coverage.  Suspicion for pneumonia is low.  On exam she has got some basilar crackles which may indicate some acute on chronic diastolic congestive heart failure.  She is ready on Lasix which we will continue.  I like to get labs today including a metabolic profile, CBC with differential, BNP and magnesium as she has recently had issues requiring frequent infusions.  Will also get a chest x-ray.  I will contact her with those results and adjust medications accordingly.  She has follow-up with her PCP at the end of February.  Chrystie Nose, MD, [facility], FACP  [place]  [facility]  Medical Director of the Advanced Lipid Disorders &  Cardiovascular Risk Reduction Clinic Diplomate of  the [employer] of Clinical Lipidology Attending Cardiologist  Direct Dial: [phone]  Fax: [phone]  Website:  [email or web address] 01/05/2024, 8:58 AM

## 2024-01-05 NOTE — Patient Instructions (Addendum)
Medication Instructions:  RESUME furosemide (lasix) 40mg  daily  *If you need a refill on your cardiac medications before your next appointment, please call your pharmacy*   Lab Work: BNP, BMET, CBC, Magnesium today   If you have labs (blood work) drawn today and your tests are completely normal, you will receive your results only by: MyChart Message (if you have MyChart) OR A paper copy in the mail If you have any lab test that is abnormal or we need to change your treatment, we will call you to review the results.   Testing/Procedures: Chest X-Ray at [facility] [address] [place], [place] [address]  Follow-Up: At [facility], you and your health needs are our priority.  As part of our continuing mission to provide you with exceptional heart care, we have created designated Provider Care Teams.  These Care Teams include your primary Cardiologist (physician) and Advanced Practice Providers (APPs -  Physician Assistants and Nurse Practitioners) who all work together to provide you with the care you need, when you need it.  We recommend signing up for the patient portal called "MyChart".  Sign up information is provided on this After Visit Summary.  MyChart is used to connect with patients for Virtual Visits (Telemedicine).  Patients are able to view lab/test results, encounter notes, upcoming appointments, etc.  Non-urgent messages can be sent to your provider as well.   To learn more about what you can do with MyChart, go to [email or web address].    Your next appointment:    2-3 months with Azalee Course, PA  Other Instructions   1st Floor: - Lobby - Registration  - Pharmacy  - Lab - Cafe  2nd Floor: - PV Lab - Diagnostic Testing (echo, CT, nuclear med)  3rd Floor: - Vacant  4th Floor: - TCTS (cardiothoracic surgery) - AFib Clinic - Structural Heart Clinic - Vascular Surgery  - Vascular Ultrasound  5th Floor: - HeartCare Cardiology (general and  EP) - Clinical Pharmacy for coumadin, hypertension, lipid, weight-loss medications, and med management appointments    Valet parking services will be available as well.

## 2024-01-06 ENCOUNTER — Encounter (HOSPITAL_COMMUNITY)
Admission: RE | Admit: 2024-01-06 | Discharge: 2024-01-06 | Disposition: A | Discharge status: Home / Self Care | Payer: Medicare HMO | Source: Ambulatory Visit | Attending: Physician Assistant

## 2024-01-06 ENCOUNTER — Other Ambulatory Visit: Payer: Self-pay | Admitting: Internal Medicine

## 2024-01-06 ENCOUNTER — Telehealth (HOSPITAL_BASED_OUTPATIENT_CLINIC_OR_DEPARTMENT_OTHER): Payer: Self-pay

## 2024-01-06 DIAGNOSIS — I5033 Acute on chronic diastolic (congestive) heart failure: Secondary | ICD-10-CM

## 2024-01-06 DIAGNOSIS — R062 Wheezing: Secondary | ICD-10-CM | POA: Diagnosis not present

## 2024-01-06 LAB — BASIC METABOLIC PANEL
BUN/Creatinine Ratio: 16 (ref 12–28)
BUN: 11 mg/dL (ref 8–27)
CO2: 25 mmol/L (ref 20–29)
Calcium: 9.3 mg/dL (ref 8.7–10.3)
Chloride: 106 mmol/L (ref 96–106)
Creatinine, Ser: 0.7 mg/dL (ref 0.57–1.00)
Glucose: 105 mg/dL — ABNORMAL HIGH (ref 70–99)
Potassium: 4.9 mmol/L (ref 3.5–5.2)
Sodium: 140 mmol/L (ref 134–144)
eGFR: 85 mL/min/{1.73_m2} (ref >59)

## 2024-01-06 LAB — CBC WITH DIFFERENTIAL/PLATELET
Basos: 1 %
EOS (ABSOLUTE): 0 10*3/uL (ref 0.0–0.2)
Eos: 2 %
Hematocrit: 35.2 % (ref 34.0–46.6)
Hemoglobin: 11 g/dL — ABNORMAL LOW (ref 11.1–15.9)
Immature Granulocytes: 0 %
Immature Granulocytes: 0 10*3/uL (ref 0.0–0.1)
Lymphs: 22 %
MCH: 29.4 pg (ref 26.6–33.0)
MCHC: 31.3 g/dL — ABNORMAL LOW (ref 31.5–35.7)
MCV: 94 fL (ref 79–97)
Monocytes Absolute: 0.1 10*3/uL (ref 0.0–0.4)
Monocytes Absolute: 0.7 10*3/uL (ref 0.1–0.9)
Monocytes: 10 %
Neutrophils Absolute: 1.5 10*3/uL (ref 0.7–3.1)
Neutrophils Absolute: 4.3 10*3/uL (ref 1.4–7.0)
Neutrophils: 65 %
Platelets: 266 10*3/uL (ref 150–450)
RBC: 3.74 x10E6/uL — ABNORMAL LOW (ref 3.77–5.28)
RDW: 13 % (ref 11.7–15.4)
WBC: 6.6 10*3/uL (ref 3.4–10.8)

## 2024-01-06 LAB — MAGNESIUM: Magnesium: 1.9 mg/dL (ref 1.6–2.3)

## 2024-01-06 LAB — BRAIN NATRIURETIC PEPTIDE: BNP: 1424.6 pg/mL — ABNORMAL HIGH (ref 0.0–100.0)

## 2024-01-06 MED ORDER — FUROSEMIDE 40 MG PO TABS
40.0000 mg | ORAL_TABLET | Freq: Two times a day (BID) | ORAL | Status: DC
Start: 2024-01-06 — End: 2024-07-16

## 2024-01-06 MED ORDER — MAGNESIUM SULFATE 2 GM/50ML IV SOLN
2.0000 g | INTRAVENOUS | Status: DC
  Administered 2024-01-06: 2 g via INTRAVENOUS
  Filled 2024-01-06: qty 50

## 2024-01-06 MED ORDER — FUROSEMIDE 40 MG PO TABS: 40.0000 mg | ORAL_TABLET | Freq: Every day | ORAL | 0 refills | Status: DC

## 2024-01-06 NOTE — Telephone Encounter (Signed)
Called and spoke to patient of needing to be seen sooner per Dr. Rennis Golden. Dr. Rennis Golden called pt prior and making her aware of abnormal lab values. Increased Lasix 40mg  BID. Pt aware. Repeat BMP in 1-2 weeks. Appt made to see APP @ [facility] office 2/21 @ 10:55 am. She verbalized understanding. Lab slips mailed to patient.

## 2024-01-10 ENCOUNTER — Encounter (HOSPITAL_COMMUNITY)
Admission: RE | Admit: 2024-01-10 | Discharge: 2024-01-10 | Disposition: A | Discharge status: Home / Self Care | Payer: Medicare HMO | Source: Ambulatory Visit | Attending: Physician Assistant

## 2024-01-10 ENCOUNTER — Telehealth: Payer: Self-pay | Admitting: *Deleted

## 2024-01-10 DIAGNOSIS — R062 Wheezing: Secondary | ICD-10-CM | POA: Diagnosis not present

## 2024-01-10 MED ORDER — MAGNESIUM SULFATE 2 GM/50ML IV SOLN
2.0000 g | INTRAVENOUS | Status: DC
  Administered 2024-01-10: 2 g via INTRAVENOUS
  Filled 2024-01-10: qty 50

## 2024-01-10 NOTE — Telephone Encounter (Signed)
Received a rx for 02 supplies and when I contacted pt she was going to call the company.  Received another one and tried to call pt to see if she would like [place] to discard this or to send it to the company. LM for her to call office.

## 2024-01-10 NOTE — Telephone Encounter (Signed)
Pt will call back on the 24th after the company comes to pick up some supplies. Pt states she is keeping the potable oxygen.

## 2024-01-12 NOTE — Progress Notes (Signed)
 Cardiology Office Note    Date:  01/15/2024  ID:  Andrea Brooks, Andrea Brooks 09-14-1936, MRN [id number] PCP:  Melida Quitter, PA  Cardiologist:  Chrystie Nose, MD  Electrophysiologist:  None   Chief Complaint: Follow up for chronic diastolic HF   History of Present Illness: .    Andrea Brooks is a 88 y.o. female with visit-pertinent history of hypertension, hyperlipidemia, diabetes mellitus type 2, CAD, anxiety, GERD and a history of chronic diastolic heart failure.  Patient previously was admitted for persistent diarrhea due to E. coli infection.  She subsequently received large amounts of hydration and developed acute diastolic heart failure.  She required diuresis and was seen by Dr. Mayford Knife and Dr. Duke Salvia.  She was noted to have elevated protein referred for cardiac catheterization in 06/2018.  Her cardiac cath showed mild nonobstructive CAD, 25% proximal LAD, 10% mid RCA, 10% proximal to mid left circumflex lesion.  Echo obtained on 07/03/2018 demonstrated EF 60 to 65%, moderate LVH, no RWMA.  She later established with Dr. Rennis Golden as he is also her husband's cardiologist.  She has been followed by GI service for gastrointestinal dysmotility.  She is also followed by [facility] and on pyridostigmine bromide daily to slow her bowels.  On 07/22/2023 patient presented to the ED for nonspecific chest pain that had been ongoing for a week.  Symptoms worsens when she took a deep breath.  Hstroponin 57>55.  Chest x-ray showed underinflation of the lungs with some interstitial changes which is chronic, tiny left pleural effusion.  CTA of the chest shows no PE, cardiomegaly with mild interstitial pulmonary edema and a small left pleural effusion, dilated ascending aorta measuring 4.3 cm, aortic atherosclerosis with coronary artery calcification.  COVID and influenza test negative.  Her symptoms was suspected to be musculoskeletal in nature.  She was readmitted on 08/04/2023 with tingling and numbness in the  extremities.  On arrival white blood count was elevated at 25,000.  Her potassium was low at 3.  Troponin elevated at 93.  Due to her GI condition at baseline is noted that she has very poor appetite.  Her Flagyl had previously been increased to 500 mg twice a day but she stopped taking it as she developed tingling numbness.  She is found to have acute on chronic diastolic heart failure on exam, moderate protein calorie malnutrition, hypomagnesemia and hypocalcemia.  She was treated with IV Lasix.  Echocardiogram obtained on 08/05/2023 showed EF 60 to 65%, mild LVH, grade 2 DD, normal RV, mild MR, dilated ascending aorta measuring at 42 mm.  She was treated with empiric antibiotic for possible right lower lobe pneumonia on admission.  She was seen in clinic on 08/11/2023, her breathing was reported as stable.  She did receive oxygen therapy at home, however it was noted she was not regularly using.  She recently seen her PCP and her magnesium had dropped from 1.6 down to 1.0.  Her PCP had started her on oral magnesium which she was unable to tolerate.  Patient was set up to receive IV magnesium with the IV infusion center.  Patient was last seen in clinic on 01/05/2024 by Dr. Rennis Golden.  She had recently been on a cruise and was sick while on the ship, saw provider in the infirmary.  She was provided erythromycin and Augmentin and cough medication.  COVID testing was negative.  Patient reported at visit that she had not yet improved, patient was still coughing.  She reported shortness  of breath and feeling as though she was gasping for air when she laid back.  She increased her pillows to 2 at night to sleep.  He denied any lower extremity edema.  On exam it was noted that she had some basilar crackles.  Patient's BNP was elevated at 1424.6, hemoglobin 11, hematocrit 35.2, potassium 4.9, sodium 140, creatinine 0.70, magnesium 1.9.  Per Dr. Rennis Golden her Lasix was increased to 40 mg twice daily and closer follow-up was  recommended.  Today she presents for follow-up.  She reports that she is doing better.  She has noted some improvement in her shortness of breath.  However she continues to have an ongoing cough with mucus production, nasal congestion, postnasal drip and fullness in the ears.  She notes that on her cruise after she became sick her husband and sister also became sick.  She has noted some improvements with this.  She denies any further orthopnea, PND or lower extremity edema.  She reports that her oxygen saturation levels at home have been at 97%.  She has continued with her IV magnesium infusions, last infusions on 2/13, 2/17 and 2/20.  She reports that she is no longer having significant GI problems as she previously was.  Labwork independently reviewed: 01/05/2024: Sodium 140, potassium 4.9, creatinine 0.70, magnesium 1.9  ROS: .   Today she denies chest pain, shortness of breath, lower extremity edema, fatigue, palpitations, melena, hematuria, hemoptysis, diaphoresis, weakness, presyncope, syncope, orthopnea, and PND.  All other systems are reviewed and otherwise negative. Studies Reviewed: Marland Kitchen    EKG:  EKG is not ordered today.   CV Studies:  Cardiac Studies & Procedures   ______________________________________________________________________________________________ CARDIAC CATHETERIZATION  CARDIAC CATHETERIZATION 07/04/2018  Narrative  Prox LAD lesion is 25% stenosed.  Mid RCA lesion is 10% stenosed.  Prox Cx to Mid Cx lesion is 10% stenosed.  LV end diastolic pressure is mildly elevated. LVEDP 16 mm Hg.  There is no aortic valve stenosis.  Nonobstructive CAD.  Continue aggressive secondary prevention and management of diastolic heart failure.  Findings Coronary Findings Diagnostic  Dominance: Right  Left Anterior Descending Prox LAD lesion is 25% stenosed.  Left Circumflex Prox Cx to Mid Cx lesion is 10% stenosed.  Right Coronary Artery Mid RCA lesion is 10%  stenosed.  Intervention  No interventions have been documented.     ECHOCARDIOGRAM  ECHOCARDIOGRAM COMPLETE 08/05/2023  Narrative ECHOCARDIOGRAM REPORT    Patient Name:   PRINCETTA UPLINGER Date of Exam: 08/05/2023 Medical Rec #:  [id number]      Height:       63.0 in Accession #:    [id number]     Weight:       136.9 lb Date of Birth:  January 04, 1936       BSA:          1.646 m Patient Age:    87 years       BP:           161/68 mmHg Patient Gender: F              HR:           75 bpm. Exam Location:  Inpatient  Procedure: 2D Echo, Cardiac Doppler and Color Doppler  Indications:    Abnormal ECG 794.31 / R94.31 Elevated Troponin  History:        Patient has prior history of Echocardiogram examinations, most recent 07/03/2018. CHF, Previous Myocardial Infarction; Risk Factors:Diabetes, Dyslipidemia and Former Smoker.  Sonographer:  Dondra Prader RVT Referring Phys: [id number] [facility]  IMPRESSIONS   1. Left ventricular ejection fraction, by estimation, is 60 to 65%. The left ventricle has normal function. The left ventricle has no regional wall motion abnormalities. There is mild concentric left ventricular hypertrophy. Left ventricular diastolic parameters are consistent with Grade II diastolic dysfunction (pseudonormalization). 2. Right ventricular systolic function is normal. The right ventricular size is normal. 3. Left atrial size was moderately dilated. 4. The mitral valve is normal in structure. Mild mitral valve regurgitation. No evidence of mitral stenosis. 5. The aortic valve is tricuspid. There is mild calcification of the aortic valve. Aortic valve regurgitation is not visualized. Aortic valve sclerosis/calcification is present, without any evidence of aortic stenosis. 6. Aortic dilatation noted. There is mild dilatation of the ascending aorta, measuring 42 mm. 7. The inferior vena cava is normal in size with greater than 50% respiratory variability, suggesting right  atrial pressure of 3 mmHg.  FINDINGS Left Ventricle: Left ventricular ejection fraction, by estimation, is 60 to 65%. The left ventricle has normal function. The left ventricle has no regional wall motion abnormalities. The left ventricular internal cavity size was normal in size. There is mild concentric left ventricular hypertrophy. Left ventricular diastolic parameters are consistent with Grade II diastolic dysfunction (pseudonormalization).  Right Ventricle: The right ventricular size is normal. No increase in right ventricular wall thickness. Right ventricular systolic function is normal.  Left Atrium: Left atrial size was moderately dilated.  Right Atrium: Right atrial size was normal in size.  Pericardium: There is no evidence of pericardial effusion.  Mitral Valve: The mitral valve is normal in structure. Mild mitral annular calcification. Mild mitral valve regurgitation. No evidence of mitral valve stenosis.  Tricuspid Valve: The tricuspid valve is normal in structure. Tricuspid valve regurgitation is mild . No evidence of tricuspid stenosis.  Aortic Valve: The aortic valve is tricuspid. There is mild calcification of the aortic valve. Aortic valve regurgitation is not visualized. Aortic valve sclerosis/calcification is present, without any evidence of aortic stenosis. Aortic valve mean gradient measures 3.0 mmHg. Aortic valve peak gradient measures 5.6 mmHg. Aortic valve area, by VTI measures 2.28 cm.  Pulmonic Valve: The pulmonic valve was normal in structure. Pulmonic valve regurgitation is mild. No evidence of pulmonic stenosis.  Aorta: Aortic dilatation noted. There is mild dilatation of the ascending aorta, measuring 42 mm.  Venous: The inferior vena cava is normal in size with greater than 50% respiratory variability, suggesting right atrial pressure of 3 mmHg.  IAS/Shunts: No atrial level shunt detected by color flow Doppler.   LEFT VENTRICLE PLAX 2D LVIDd:          4.90 cm   Diastology LVIDs:         3.60 cm   LV e' medial:    5.23 cm/s LV PW:         1.20 cm   LV E/e' medial:  20.8 LV IVS:        1.20 cm   LV e' lateral:   7.54 cm/s LVOT diam:     1.90 cm   LV E/e' lateral: 14.5 LV SV:         58 LV SV Index:   35 LVOT Area:     2.84 cm   RIGHT VENTRICLE             IVC RV Basal diam:  3.30 cm     IVC diam: 1.50 cm RV S prime:  13.10 cm/s TAPSE (M-mode): 2.1 cm  LEFT ATRIUM             Index        RIGHT ATRIUM           Index LA diam:        4.50 cm 2.73 cm/m   RA Area:     14.00 cm LA Vol (A2C):   64.4 ml 39.10 ml/m  RA Volume:   30.50 ml  18.53 ml/m LA Vol (A4C):   72.0 ml 43.74 ml/m LA Biplane Vol: 72.8 ml 44.23 ml/m AORTIC VALVE                    PULMONIC VALVE AV Area (Vmax):    2.27 cm     PV Vmax:          1.20 m/s AV Area (Vmean):   2.29 cm     PV Peak grad:     5.8 mmHg AV Area (VTI):     2.28 cm     PR End Diast Vel: 6.25 msec AV Vmax:           118.00 cm/s AV Vmean:          79.900 cm/s AV VTI:            0.252 m AV Peak Grad:      5.6 mmHg AV Mean Grad:      3.0 mmHg LVOT Vmax:         94.40 cm/s LVOT Vmean:        64.500 cm/s LVOT VTI:          0.203 m LVOT/AV VTI ratio: 0.81  AORTA Ao Root diam: 3.40 cm Ao Asc diam:  4.20 cm  MITRAL VALVE                TRICUSPID VALVE MV Area (PHT): 4.06 cm     TR Peak grad:   22.5 mmHg MV Decel Time: 187 msec     TR Vmax:        237.00 cm/s MV E velocity: 109.00 cm/s MV A velocity: 100.00 cm/s  SHUNTS MV E/A ratio:  1.09         Systemic VTI:  0.20 m Systemic Diam: 1.90 cm  Arvilla Meres MD Electronically signed by Arvilla Meres MD Signature Date/Time: 08/05/2023/2:23:26 PM    Final          ______________________________________________________________________________________________      Current Reported Medications:.    Current Meds  Medication Sig   acetaminophen (TYLENOL) 500 MG tablet Take 1 tablet (500 mg total) by mouth every 8  (eight) hours as needed for moderate pain.   aspirin EC 81 MG tablet Take 81 mg by mouth daily.   calcium-vitamin D (OSCAL WITH D) 500-5 MG-MCG tablet Take 1 tablet by mouth 2 (two) times daily.   carvedilol (COREG) 6.25 MG tablet TAKE 1 TABLET TWICE A DAY WITH MEALS   furosemide (LASIX) 40 MG tablet Take 1 tablet (40 mg total) by mouth 2 (two) times daily.   guaiFENesin-dextromethorphan (ROBITUSSIN DM) 100-10 MG/5ML syrup Take 5 mLs by mouth every 4 (four) hours as needed for cough.   HYDROcodone bit-homatropine (HYCODAN) 5-1.5 MG/5ML syrup Take 5 mLs by mouth at bedtime as needed for cough.   LORazepam (ATIVAN) 1 MG tablet Take 0.5-1 tablets (0.5-1 mg total) by mouth as needed for anxiety (maximum once per day).   Magnesium Gluconate 250 MG TABS Take 1-2 tablets (  250-500 mg total) by mouth 2 (two) times daily. START 1 TAB DAILY, SLOWLY INCREASE AS TOLERATED.   Multiple Vitamins-Minerals (MULTI FOR HER 50+) TABS Take 1 tablet by mouth daily.   omeprazole (PRILOSEC) 20 MG capsule TAKE 1 CAPSULE DAILY 30 MINUTES BEFORE BREAKFAST   polyethylene glycol powder (GLYCOLAX/MIRALAX) 17 GM/SCOOP powder Take 8.5 g by mouth daily.   pyridostigmine (MESTINON) 60 MG tablet Take 60 mg by mouth daily.   simvastatin (ZOCOR) 20 MG tablet TAKE 1 TABLET AT BEDTIME   valsartan (DIOVAN) 320 MG tablet TAKE 1 TABLET DAILY   Vitamin D, Ergocalciferol, (DRISDOL) 1.25 MG (50000 UNIT) CAPS capsule Take 1 capsule (50,000 Units total) by mouth every 7 (seven) days.   Current Facility-Administered Medications for the 01/14/24 encounter (Office Visit) with Reather Littler D, NP  Medication   magnesium sulfate 2 g in dextrose 5 % 250 mL IVPB   magnesium sulfate 2 g in dextrose 5 % 250 mL IVPB   magnesium sulfate 2 g in dextrose 5 % 250 mL IVPB   magnesium sulfate 2 g in dextrose 5 % 250 mL IVPB   magnesium sulfate 2 g in dextrose 5 % 250 mL IVPB   Physical Exam:    VS:  BP 110/60 (BP Location: Right Arm, Patient Position:  Sitting, Cuff Size: Normal)   Pulse 84   Ht 5\' 4"  (1.626 m)   Wt 127 lb (57.6 kg)   SpO2 96%   BMI 21.80 kg/m    Wt Readings from Last 3 Encounters:  01/14/24 127 lb (57.6 kg)  01/10/24 124 lb 3.2 oz (56.3 kg)  01/06/24 130 lb 12.8 oz (59.3 kg)    GEN: Well nourished, well developed in no acute distress NECK: No JVD; No carotid bruits CARDIAC: RRR, no murmurs, rubs, gallops RESPIRATORY:  Clear to auscultation without rales, wheezing or rhonchi  ABDOMEN: Soft, non-tender, non-distended EXTREMITIES:  No edema; No acute deformity   Asessement and Plan:.    Chronic diastolic HF: Echocardiogram obtained on 08/05/2023 showed EF 60 to 65%, mild LVH, grade 2 DD, normal RV, mild MR, dilated ascending aorta measuring at 42 mm.  Seen at office visit on 01/05/2024 by Dr. Rennis Golden, noted orthopnea and increased coughing.  BNP was elevated at 1424.6, her Lasix was increased to 40 mg twice daily.  Today she presents for follow-up.  She reports that she is doing better, her shortness of breath has improved and she is no longer having any orthopnea.  She notes that she does have ongoing cough with mucus production, increased postnasal drip, nasal congestion and ear fullness.  On exam she appears euvolemic and well compensated. Will check BMET and BNP today, to determine if she has further need for increased lasix.   CAD: Cardiac cath in 06/2018 indicated mild nonobstructive CAD, 25% proximal LAD, 10% mid RCA, 10% proximal to mid left circumflex lesion. Stable with no anginal symptoms. No indication for ischemic evaluation.  Heart healthy diet and regular cardiovascular exercise encouraged.  Continue aspirin 81 mg daily, carvedilol 6.25 mg twice daily, valsartan 320 mg daily and simvastatin 20 mg daily.  Hypertension: Blood pressure today 110/60. Continue current antihypertensive regimen.  Hyperlipidemia: Last lipid profile on 02/04/2023 indicated total cholesterol 118, HDL 30, triglycerides 164 and LDL 60.   Continue simvastatin 20 mg daily.  Hypomagnesemia: Patient with low magnesium levels requiring regular IV infusions secondary to chronic GI issues..  Her last magnesium level on 01/05/2024 was 1.9, last IV infusions were on 2/13, 2/17  and 2/21.    Disposition: F/u with Azalee Course, PA in 02/2024 as scheduled.   Signed, Rip Harbour, NP

## 2024-01-13 ENCOUNTER — Encounter (HOSPITAL_COMMUNITY)
Admission: RE | Admit: 2024-01-13 | Discharge: 2024-01-13 | Disposition: A | Discharge status: Home / Self Care | Payer: Medicare HMO | Source: Ambulatory Visit | Attending: Physician Assistant

## 2024-01-13 DIAGNOSIS — R062 Wheezing: Secondary | ICD-10-CM | POA: Diagnosis not present

## 2024-01-13 MED ORDER — MAGNESIUM SULFATE 2 GM/50ML IV SOLN
2.0000 g | INTRAVENOUS | Status: DC
  Administered 2024-01-13: 2 g via INTRAVENOUS
  Filled 2024-01-13: qty 50

## 2024-01-14 ENCOUNTER — Encounter: Payer: Self-pay | Admitting: Cardiology

## 2024-01-14 ENCOUNTER — Ambulatory Visit: Payer: Medicare HMO | Attending: Cardiology | Admitting: Cardiology

## 2024-01-14 VITALS — BP 110/60 | HR 84 | Ht 64.0 in | Wt 127.0 lb

## 2024-01-14 DIAGNOSIS — E782 Mixed hyperlipidemia: Secondary | ICD-10-CM

## 2024-01-14 DIAGNOSIS — I5032 Chronic diastolic (congestive) heart failure: Secondary | ICD-10-CM

## 2024-01-14 DIAGNOSIS — I10 Essential (primary) hypertension: Secondary | ICD-10-CM | POA: Diagnosis not present

## 2024-01-14 DIAGNOSIS — I2510 Atherosclerotic heart disease of native coronary artery without angina pectoris: Secondary | ICD-10-CM

## 2024-01-14 NOTE — Patient Instructions (Signed)
Medication Instructions:  No changes *If you need a refill on your cardiac medications before your next appointment, please call your pharmacy*  Lab Work: Today we are going to draw Bmet and BNP If you have labs (blood work) drawn today and your tests are completely normal, you will receive your results only by: MyChart Message (if you have MyChart) OR A paper copy in the mail If you have any lab test that is abnormal or we need to change your treatment, we will call you to review the results.  Testing/Procedures: No testing  Follow-Up: At [facility], you and your health needs are our priority.  As part of our continuing mission to provide you with exceptional heart care, we have created designated Provider Care Teams.  These Care Teams include your primary Cardiologist (physician) and Advanced Practice Providers (APPs -  Physician Assistants and Nurse Practitioners) who all work together to provide you with the care you need, when you need it.  We recommend signing up for the patient portal called "MyChart".  Sign up information is provided on this After Visit Summary.  MyChart is used to connect with patients for Virtual Visits (Telemedicine).  Patients are able to view lab/test results, encounter notes, upcoming appointments, etc.  Non-urgent messages can be sent to your provider as well.   To learn more about what you can do with MyChart, go to [email or web address].    Your next appointment:   Reschedule  Provider:   Azalee Course, PA-C     Other Instructions

## 2024-01-15 ENCOUNTER — Encounter: Payer: Self-pay | Admitting: Cardiology

## 2024-01-15 LAB — BASIC METABOLIC PANEL
BUN/Creatinine Ratio: 31 — ABNORMAL HIGH (ref 12–28)
BUN: 36 mg/dL — ABNORMAL HIGH (ref 8–27)
CO2: 25 mmol/L (ref 20–29)
Calcium: 9.7 mg/dL (ref 8.7–10.3)
Chloride: 99 mmol/L (ref 96–106)
Creatinine, Ser: 1.15 mg/dL — ABNORMAL HIGH (ref 0.57–1.00)
Glucose: 95 mg/dL (ref 70–99)
Potassium: 4.7 mmol/L (ref 3.5–5.2)
Sodium: 140 mmol/L (ref 134–144)
eGFR: 47 mL/min/{1.73_m2} — ABNORMAL LOW (ref >59)

## 2024-01-15 LAB — BRAIN NATRIURETIC PEPTIDE: BNP: 58.2 pg/mL (ref 0.0–100.0)

## 2024-01-17 ENCOUNTER — Telehealth: Payer: Self-pay

## 2024-01-17 ENCOUNTER — Telehealth: Payer: Self-pay | Admitting: Physician Assistant

## 2024-01-17 NOTE — Telephone Encounter (Signed)
-----   Message from Rip Harbour sent at 01/17/2024 10:57 AM EST ----- Please let Andrea Brooks know that her fluid level has returned to normal, she had a slight reduction in kidney function. Her electrolytes are normal. Would recommend that she reduce her lasix back to 40 mg once daily.

## 2024-01-17 NOTE — Telephone Encounter (Signed)
 Patient is calling to see if Andrea Brooks would like her to do another blood test involving her Magnesium. Please call back

## 2024-01-17 NOTE — Telephone Encounter (Signed)
 Called patient advised of below they verbalized understanding.

## 2024-01-18 ENCOUNTER — Ambulatory Visit (INDEPENDENT_AMBULATORY_CARE_PROVIDER_SITE_OTHER): Payer: Medicare HMO | Admitting: Family Medicine

## 2024-01-18 ENCOUNTER — Other Ambulatory Visit: Payer: Self-pay | Admitting: Internal Medicine

## 2024-01-18 ENCOUNTER — Telehealth: Payer: Self-pay | Admitting: *Deleted

## 2024-01-18 ENCOUNTER — Other Ambulatory Visit: Payer: Self-pay | Admitting: Physician Assistant

## 2024-01-18 ENCOUNTER — Encounter: Payer: Self-pay | Admitting: Family Medicine

## 2024-01-18 VITALS — BP 96/59 | Ht 64.0 in | Wt 127.0 lb

## 2024-01-18 DIAGNOSIS — F419 Anxiety disorder, unspecified: Secondary | ICD-10-CM

## 2024-01-18 DIAGNOSIS — I152 Hypertension secondary to endocrine disorders: Secondary | ICD-10-CM

## 2024-01-18 DIAGNOSIS — E1159 Type 2 diabetes mellitus with other circulatory complications: Secondary | ICD-10-CM

## 2024-01-18 DIAGNOSIS — E119 Type 2 diabetes mellitus without complications: Secondary | ICD-10-CM

## 2024-01-18 DIAGNOSIS — R052 Subacute cough: Secondary | ICD-10-CM | POA: Diagnosis not present

## 2024-01-18 LAB — POCT GLYCOSYLATED HEMOGLOBIN (HGB A1C): Hemoglobin A1C: 5.3 % (ref 4.0–5.6)

## 2024-01-18 MED ORDER — LORAZEPAM 1 MG PO TABS
0.5000 mg | ORAL_TABLET | ORAL | 0 refills | Status: DC | PRN
Start: 2024-01-18 — End: 2024-03-24

## 2024-01-18 MED ORDER — BENZONATATE 200 MG PO CAPS: 200.0000 mg | ORAL_CAPSULE | Freq: Two times a day (BID) | ORAL | 0 refills | Status: DC | PRN

## 2024-01-18 NOTE — Assessment & Plan Note (Addendum)
 BP goal <130/80.  Stable, at goal.  Continue follow-up with cardiology.  Continue furosemide 40 mg daily, carvedilol 6.25 mg daily, losartan 320 mg daily.  Will continue to monitor. CMP checked by cardiology on 01/14/2024 with BUN 36, creatinine 1.15, BUN/creatinine ratio 31, and a decreased GFR of 47.  BNP had normalized by that time to 58.2.  Cardiology recommended reducing Lasix back to 40 mg once daily as a result.  Follow-up with cardiology as scheduled April 2025.

## 2024-01-18 NOTE — Assessment & Plan Note (Addendum)
 Most recent A1c 5.3, at goal.  Continue nutritional interventions, congratulated her on good glycemic control.  Recheck A1c every 6 months.

## 2024-01-18 NOTE — Progress Notes (Signed)
 Established Patient Office Visit  Subjective   Patient ID: Andrea Brooks, female    DOB: 1935-12-13  Age: 89 y.o. MRN: [id number]  Chief Complaint  Patient presents with   Hypertension   Diabetes   Mood    HPI Andrea Brooks is a 88 y.o. female presenting today for follow up of hypertension, diabetes, mood.  She is still experiencing a mostly dry cough even after being treated for pneumonia.  CXR by cardiology on 01/05/2024 was negative for pneumonia or acute cardiopulmonary changes. Hypertension:  Pt denies chest pain, SOB, dizziness, edema, syncope, fatigue or heart palpitations. Taking furosemide, carvedilol, losartan, reports excellent compliance with treatment. Denies side effects.  Cardiology is following her closely and is also monitoring low magnesium and managing with IV magnesium infusions. Diabetes: denies hypoglycemic events, wounds or sores that are not healing well, increased thirst or urination. Denies vision problems, eye exam due. Has been following low-carb diet. Mood: Patient is here to follow up for anxiety, currently managing with rescue medication Ativan. Taking medication without side effects, reports excellent compliance with treatment. Denies mood changes or SI/HI. She feels mood is stable since last visit.     01/18/2024   11:18 AM 09/16/2023    9:35 AM 04/08/2023    2:43 PM  Depression screen PHQ 2/9  Decreased Interest 0 0 0  Down, Depressed, Hopeless 0 0 0  PHQ - 2 Score 0 0 0  Altered sleeping 1 1 0  Tired, decreased energy 1 1 0  Change in appetite 0 1 0  Feeling bad or failure about yourself  0 0 0  Trouble concentrating 0 0 0  Moving slowly or fidgety/restless 0 0 0  Suicidal thoughts 0 0 0  PHQ-9 Score 2 3 0  Difficult doing work/chores Not difficult at all Somewhat difficult Not difficult at all       01/18/2024   11:18 AM 09/16/2023    9:36 AM 09/15/2022    8:43 AM 03/13/2022   11:10 AM  GAD 7 : Generalized Anxiety Score  Nervous, Anxious,  on Edge 3 1 1 1   Control/stop worrying 1 1 1 1   Worry too much - different things 1 1 1 1   Trouble relaxing 1 0 0 0  Restless 0 0 0 0  Easily annoyed or irritable 0 0 0 0  Afraid - awful might happen 0 0 0 0  Total GAD 7 Score 6 3 3 3   Anxiety Difficulty Not difficult at all Somewhat difficult Not difficult at all Not difficult at all     Outpatient Medications Prior to Visit  Medication Sig   acetaminophen (TYLENOL) 500 MG tablet Take 1 tablet (500 mg total) by mouth every 8 (eight) hours as needed for moderate pain.   aspirin EC 81 MG tablet Take 81 mg by mouth daily.   carvedilol (COREG) 6.25 MG tablet TAKE 1 TABLET TWICE A DAY WITH MEALS   furosemide (LASIX) 40 MG tablet Take 1 tablet (40 mg total) by mouth 2 (two) times daily.   Multiple Vitamins-Minerals (MULTI FOR HER 50+) TABS Take 1 tablet by mouth daily.   omeprazole (PRILOSEC) 20 MG capsule TAKE 1 CAPSULE DAILY 30 MINUTES BEFORE BREAKFAST   polyethylene glycol powder (GLYCOLAX/MIRALAX) 17 GM/SCOOP powder Take 8.5 g by mouth daily.   pyridostigmine (MESTINON) 60 MG tablet Take 60 mg by mouth daily.   simvastatin (ZOCOR) 20 MG tablet TAKE 1 TABLET AT BEDTIME   valsartan (DIOVAN) 320 MG tablet TAKE 1  TABLET DAILY   [DISCONTINUED] LORazepam (ATIVAN) 1 MG tablet Take 0.5-1 tablets (0.5-1 mg total) by mouth as needed for anxiety (maximum once per day).   calcium-vitamin D (OSCAL WITH D) 500-5 MG-MCG tablet Take 1 tablet by mouth 2 (two) times daily. (Patient not taking: Reported on 01/18/2024)   guaiFENesin-dextromethorphan (ROBITUSSIN DM) 100-10 MG/5ML syrup Take 5 mLs by mouth every 4 (four) hours as needed for cough. (Patient not taking: Reported on 01/18/2024)   HYDROcodone bit-homatropine (HYCODAN) 5-1.5 MG/5ML syrup Take 5 mLs by mouth at bedtime as needed for cough. (Patient not taking: Reported on 01/18/2024)   Vitamin D, Ergocalciferol, (DRISDOL) 1.25 MG (50000 UNIT) CAPS capsule Take 1 capsule (50,000 Units total) by mouth  every 7 (seven) days. (Patient not taking: Reported on 01/18/2024)   [DISCONTINUED] Magnesium Gluconate 250 MG TABS Take 1-2 tablets (250-500 mg total) by mouth 2 (two) times daily. START 1 TAB DAILY, SLOWLY INCREASE AS TOLERATED.   Facility-Administered Medications Prior to Visit  Medication Dose Route Frequency Provider   magnesium sulfate 2 g in dextrose 5 % 250 mL IVPB  2 g Intravenous Once Azalee Course, PA   magnesium sulfate 2 g in dextrose 5 % 250 mL IVPB  2 g Intravenous Once Azalee Course, PA   magnesium sulfate 2 g in dextrose 5 % 250 mL IVPB  2 g Intravenous Once Azalee Course, PA   magnesium sulfate 2 g in dextrose 5 % 250 mL IVPB  2 g Intravenous Once Azalee Course, PA   magnesium sulfate 2 g in dextrose 5 % 250 mL IVPB  2 g Intravenous Once [place], Wynema Birch, PA    ROS Negative unless otherwise noted in HPI   Objective:     BP (!) 96/59   Ht 5\' 4"  (1.626 m)   Wt 127 lb (57.6 kg)   BMI 21.80 kg/m   Physical Exam Constitutional:      General: She is not in acute distress.    Appearance: Normal appearance.  HENT:     Head: Normocephalic and atraumatic.  Cardiovascular:     Rate and Rhythm: Normal rate and regular rhythm.     Heart sounds: No murmur heard.    No friction rub. No gallop.  Pulmonary:     Effort: Pulmonary effort is normal. No respiratory distress.     Breath sounds: No wheezing, rhonchi or rales.  Skin:    General: Skin is warm and dry.  Neurological:     Mental Status: She is alert and oriented to person, place, and time.    Diabetic Foot Exam - Simple   Simple Foot Form Diabetic Foot exam was performed with the following findings: Yes 01/18/2024 12:58 PM  Visual Inspection No deformities, no ulcerations, no other skin breakdown bilaterally: Yes Sensation Testing Intact to touch and monofilament testing bilaterally: Yes Pulse Check Posterior Tibialis and Dorsalis pulse intact bilaterally: Yes Comments     Results for orders placed or performed in visit on  01/18/24  POCT glycosylated hemoglobin (Hb A1C)  Result Value Ref Range   Hemoglobin A1C 5.3 4.0 - 5.6 %   HbA1c POC (<> result, manual entry)     HbA1c, POC (prediabetic range)     HbA1c, POC (controlled diabetic range)        Assessment & Plan:  Hypertension associated with diabetes (HCC) Assessment & Plan: BP goal <130/80.  Stable, at goal.  Continue follow-up with cardiology.  Continue furosemide 40 mg daily, carvedilol 6.25 mg daily, losartan 320  mg daily.  Will continue to monitor. CMP checked by cardiology on 01/14/2024 with BUN 36, creatinine 1.15, BUN/creatinine ratio 31, and a decreased GFR of 47.  BNP had normalized by that time to 58.2.  Cardiology recommended reducing Lasix back to 40 mg once daily as a result.  Follow-up with cardiology as scheduled April 2025.   Diabetes mellitus without complication (HCC) Assessment & Plan: Most recent A1c 5.3, at goal.  Continue nutritional interventions, congratulated her on good glycemic control.  Recheck A1c every 6 months.  Orders: -     POCT glycosylated hemoglobin (Hb A1C)  Anxiety Assessment & Plan: Reinforced that benzodiazepines like lorazepam, with increased risk for anyone over 32 years old.  Patient verbalized understanding that it should be saved for rescue medication only.  Orders: -     LORazepam; Take 0.5-1 tablets (0.5-1 mg total) by mouth as needed for anxiety (maximum once per day).  Dispense: 30 tablet; Refill: 0  Subacute cough -     Benzonatate; Take 1 capsule (200 mg total) by mouth 2 (two) times daily as needed for cough.  Dispense: 20 capsule; Refill: 0  Recommended that she reach out to her cardiologist for refills of simvastatin and losartan, and to reach out to her gastroenterologist to determine if refills of pyridostigmine and Flagyl are appropriate.  Return in about 4 months (around 05/17/2024) for follow-up for HTN, DM, mood, fasting labs 1 week before.    Melida Quitter, PA

## 2024-01-18 NOTE — Telephone Encounter (Signed)
 Patient identification verified by 2 forms. Marilynn Rail, RN    Called and spoke to patient  Relayed provider message below  Patient verbalized understanding, no questions at this time    Orders released

## 2024-01-18 NOTE — Telephone Encounter (Signed)
 Dr. Blanchie Dessert pt. She is requesting a refill of Valsartan. No Cardiologist here has prescribed or refilled this. Does Dr. Rennis Golden want to refill?

## 2024-01-18 NOTE — Patient Instructions (Addendum)
 Call your cardiologist to ask them for refills of: -simvastatin  -losartan  Call your gastroenterologist to ask them for refills of: -pyridostigmine (Mestinon) -metronidazole (Flagyl)

## 2024-01-18 NOTE — Assessment & Plan Note (Signed)
 Reinforced that benzodiazepines like lorazepam, with increased risk for anyone over 86 years old.  Patient verbalized understanding that it should be saved for rescue medication only.

## 2024-01-18 NOTE — Telephone Encounter (Signed)
*  STAT* If patient is at the pharmacy, call can be transferred to refill team.   1. Which medications need to be refilled? (please list name of each medication and dose if known) simvastatin (ZOCOR) 20 MG tablet  valsartan (DIOVAN) 320 MG tablet    2. Which pharmacy/location (including street and city if local pharmacy) is medication to be sent to? [facility] - [place], [place] - [address] AT Portal to Registered [facility] Sites [phone]   3. Do they need a 30 day or 90 day supply? 90

## 2024-01-18 NOTE — Telephone Encounter (Signed)
 Repeat Magnesium in 2 weeks please, I will place order. Thank you

## 2024-01-18 NOTE — Telephone Encounter (Signed)
 Contacted [facility] and provided the DEA number for Dr. Constance Goltz

## 2024-01-18 NOTE — Telephone Encounter (Signed)
 Copied from CRM [id number]. Topic: General - Other >> Jan 18, 2024  2:01 PM Herbert Seta B wrote: Reason for CRM: Chris-pharmacy tech from [employer] [phone] option#2 reference #[id number] Calling because RX LORazepam (ATIVAN) 1 MG tablet was sent by Constance Goltz without a DEA#

## 2024-01-19 ENCOUNTER — Encounter: Payer: Self-pay | Admitting: Family Medicine

## 2024-01-19 MED ORDER — VALSARTAN 320 MG PO TABS: 320.0000 mg | ORAL_TABLET | Freq: Every day | ORAL | 3 refills | Status: AC

## 2024-01-19 MED ORDER — SIMVASTATIN 20 MG PO TABS: 20.0000 mg | ORAL_TABLET | Freq: Every day | ORAL | 3 refills | Status: AC

## 2024-02-02 NOTE — Telephone Encounter (Signed)
 Please remind the patient she should have her Magnesium level checked this week

## 2024-02-21 ENCOUNTER — Telehealth: Payer: Self-pay

## 2024-02-21 NOTE — Telephone Encounter (Signed)
 Copied from CRM [id number]. Topic: General - Other >> Feb 18, 2024  4:26 PM Cammy Copa D wrote: Reason for CRM: Angie at [facility] regarding an order sent at the end of January for Oxygen Loyal Buba), was sent to Melida Quitter, [place]. No confirmation of order received yet.

## 2024-02-21 NOTE — Telephone Encounter (Signed)
 LVM for someone to call back in reference to this.  I spoke with pt and she is wanting [employer] to come and pick up the items, she said she has not used O2 in a month.

## 2024-02-21 NOTE — Telephone Encounter (Signed)
 Faxed form back to discontinue this order.

## 2024-03-04 ENCOUNTER — Other Ambulatory Visit: Payer: Self-pay | Admitting: Physician Assistant

## 2024-03-04 LAB — MAGNESIUM: Magnesium: 1.2 mg/dL — ABNORMAL LOW (ref 1.6–2.3)

## 2024-03-04 MED ORDER — MAGNESIUM SULFATE 50 % IJ SOLN: 2.0000 g | INTRAMUSCULAR | Status: AC

## 2024-03-07 ENCOUNTER — Ambulatory Visit: Payer: Medicare HMO | Admitting: Physician Assistant

## 2024-03-08 ENCOUNTER — Encounter: Payer: Self-pay | Admitting: Physician Assistant

## 2024-03-08 ENCOUNTER — Ambulatory Visit: Payer: Medicare HMO | Attending: Physician Assistant | Admitting: Physician Assistant

## 2024-03-08 VITALS — BP 114/58 | HR 64 | Ht 64.0 in | Wt 129.0 lb

## 2024-03-08 DIAGNOSIS — I2510 Atherosclerotic heart disease of native coronary artery without angina pectoris: Secondary | ICD-10-CM

## 2024-03-08 DIAGNOSIS — I10 Essential (primary) hypertension: Secondary | ICD-10-CM | POA: Diagnosis not present

## 2024-03-08 DIAGNOSIS — Z79899 Other long term (current) drug therapy: Secondary | ICD-10-CM

## 2024-03-08 DIAGNOSIS — E785 Hyperlipidemia, unspecified: Secondary | ICD-10-CM

## 2024-03-08 NOTE — Patient Instructions (Signed)
 Medication Instructions:  NO CHANGES *If you need a refill on your cardiac medications before your next appointment, please call your pharmacy*  Lab Work: MAGNESIUM IN 2-3 WEEKS If you have labs (blood work) drawn today and your tests are completely normal, you will receive your results only by: MyChart Message (if you have MyChart) OR A paper copy in the mail If you have any lab test that is abnormal or we need to change your treatment, we will call you to review the results.  Testing/Procedures: NO TESTING  Follow-Up: At [facility], you and your health needs are our priority.  As part of our continuing mission to provide you with exceptional heart care, our providers are all part of one team.  This team includes your primary Cardiologist (physician) and Advanced Practice Providers or APPs (Physician Assistants and Nurse Practitioners) who all work together to provide you with the care you need, when you need it.  Your next appointment:   4-6 month(s)  Provider:   Hazle Lites, MD   Other Instructions   1st Floor: - Lobby - Registration  - Pharmacy  - Lab - Cafe  2nd Floor: - PV Lab - Diagnostic Testing (echo, CT, nuclear med)  3rd Floor: - Vacant  4th Floor: - TCTS (cardiothoracic surgery) - AFib Clinic - Structural Heart Clinic - Vascular Surgery  - Vascular Ultrasound  5th Floor: - [facility] (general and EP) - Clinical Pharmacy for coumadin, hypertension, lipid, weight-loss medications, and med management appointments    Valet parking services will be available as well.

## 2024-03-08 NOTE — Progress Notes (Signed)
 Cardiology Office Note:  .   Date:  03/08/2024  ID:  Andrea Brooks, DOB 31-Jan-1936, MRN [id number] PCP: No primary care provider on file.  [facility] Providers Cardiologist:  Chrystie Nose, MD     History of Present Illness: .   Andrea Brooks is a 88 y.o. female with PMH of HTN, HLD, DM II, CAD, anxiety, GERD and a history of chronic diastolic heart failure.  Patient previously was admitted for persistent diarrhea due to E. coli infection.  She subsequently received large amount of hydration and developed acute diastolic heart failure.  She required diuresis and was seen by Dr. Mayford Knife and Dr. Duke Salvia.  She was noted to have elevated troponin and referred for cardiac catheterization on 07/04/2018 which showed mild nonobstructive CAD, 25% proximal LAD, 10% mid RCA, 10% proximal to mid left circumflex lesion.  Echocardiogram obtained on 07/03/2018 demonstrated EF 60 to 65%, moderate LVH, no regional wall motion abnormality.  She later established with Dr. Rennis Golden who is also her husband's cardiologist.  She is being followed by GI service for gastrointestinal dysmotility issue.  She is being followed by [facility] and on pyridostigmine bromide daily to slow down her bowel.  To prevent bacterial overgrowth from slow bowel, she has been taking Flagyl.   She was seen in the ED in August 2024 due to nonspecific chest pain has been going on for a week.  Symptom worsen when she take a deep breath.  Serial troponin was borderline elevated but remained flat.  Chest x-ray showed underinflation of the lung was some interstitial changes that is chronic.  CTA of the chest showed no PE, cardiomegaly with mild interstitial pulmonary edema and a small left pleural effusion, ascending aorta measuring at 4.3 cm.  COVID and influenza test were negative.  Her symptom was suspected to be musculoskeletal in nature.  She was readmitted in September 2024 with tingling and numbness in the extremities.  On arrival,  white blood cell count was elevated at 24,000, potassium was low at 3.0.  Troponin was elevated at 93.  Due to GI's condition at baseline, she had very poor appetite.  Her Flagyl was increased but she stopped taking it as she developed tingling numbness.  She was found to be in acute on chronic diastolic heart failure on exam, she has moderate protein calorie malnutrition, hypomagnesemia and hypocalcemia.  She was treated with IV fluid.  Echocardiogram obtained on 08/05/2023 showed EF 60 to 65%, mild LVH, grade 2 DD, normal RV, mild MR, dilated ascending aorta measuring at 42 mm.  She was treated with empiric antibiotic for possible right lower lobe pneumonia on admission.  I last saw the patient in October 2024 she continued to have GI issues and has significant hypomagnesemia, I set her up to receive outpatient IV magnesium infusion.  She was seen by Dr. Rennis Golden in February at which time she has been sick after going on a cruise.  COVID test was negative.  BNP was elevated at 1424, hemoglobin 11, hematocrit 35, potassium 4.9, magnesium 1.9.  Her Lasix was increased to 40 mg twice daily.  She was seen by Ralph Dowdy NP on 01/14/2024, she was doing better at that time.  Blood work at the time showed her creatinine has trended up from the previous of 0.7 to 1.15, BMP has came down to 58 from the previous 1424.  Lasix was reduced back to 40 mg daily.  Patient presents today for follow-up along with her husband.  She has oxygen at home however has not used it in the past month.  We walked her around the office 2 laps, O2 saturation was initially 97%, came down to 90% at the end of 2 labs.  Heart rate increased from high 62 to high 80s.  She does not require oxygen at this time.  She appears to be euvolemic on exam.  We reviewed the recent blood work, her magnesium is low again at 1.2, I previously has placed a order for her to get IV magnesium infusion, she will need to call the infusion clinic to set it up.   Otherwise, she denies any exertional chest pain.  She has no lower extremity edema, orthopnea or PND.  She can follow-up with Dr. Rennis Golden in 4 to 6 months.  ROS:   She denies chest pain, palpitations, dyspnea, pnd, orthopnea, n, v, dizziness, syncope, edema, weight gain, or early satiety. All other systems reviewed and are otherwise negative except as noted above.    Studies Reviewed: .        Cardiac Studies & Procedures   ______________________________________________________________________________________________ CARDIAC CATHETERIZATION  CARDIAC CATHETERIZATION 07/04/2018  Conclusion  Prox LAD lesion is 25% stenosed.  Mid RCA lesion is 10% stenosed.  Prox Cx to Mid Cx lesion is 10% stenosed.  LV end diastolic pressure is mildly elevated. LVEDP 16 mm Hg.  There is no aortic valve stenosis.  Nonobstructive CAD.  Continue aggressive secondary prevention and management of diastolic heart failure.  Findings Coronary Findings Diagnostic  Dominance: Right  Left Anterior Descending Prox LAD lesion is 25% stenosed.  Left Circumflex Prox Cx to Mid Cx lesion is 10% stenosed.  Right Coronary Artery Mid RCA lesion is 10% stenosed.  Intervention  No interventions have been documented.     ECHOCARDIOGRAM  ECHOCARDIOGRAM COMPLETE 08/05/2023  Narrative ECHOCARDIOGRAM REPORT    Patient Name:   Andrea Brooks Date of Exam: 08/05/2023 Medical Rec #:  [id number]      Height:       63.0 in Accession #:    [id number]     Weight:       136.9 lb Date of Birth:  August 12, 1936       BSA:          1.646 m Patient Age:    87 years       BP:           161/68 mmHg Patient Gender: F              HR:           75 bpm. Exam Location:  Inpatient  Procedure: 2D Echo, Cardiac Doppler and Color Doppler  Indications:    Abnormal ECG 794.31 / R94.31 Elevated Troponin  History:        Patient has prior history of Echocardiogram examinations, most recent 07/03/2018. CHF, Previous Myocardial  Infarction; Risk Factors:Diabetes, Dyslipidemia and Former Smoker.  Sonographer:    Dondra Prader RVT Referring Phys: [id number] [facility]  IMPRESSIONS   1. Left ventricular ejection fraction, by estimation, is 60 to 65%. The left ventricle has normal function. The left ventricle has no regional wall motion abnormalities. There is mild concentric left ventricular hypertrophy. Left ventricular diastolic parameters are consistent with Grade II diastolic dysfunction (pseudonormalization). 2. Right ventricular systolic function is normal. The right ventricular size is normal. 3. Left atrial size was moderately dilated. 4. The mitral valve is normal in structure. Mild mitral valve regurgitation. No evidence of mitral stenosis. 5.  The aortic valve is tricuspid. There is mild calcification of the aortic valve. Aortic valve regurgitation is not visualized. Aortic valve sclerosis/calcification is present, without any evidence of aortic stenosis. 6. Aortic dilatation noted. There is mild dilatation of the ascending aorta, measuring 42 mm. 7. The inferior vena cava is normal in size with greater than 50% respiratory variability, suggesting right atrial pressure of 3 mmHg.  FINDINGS Left Ventricle: Left ventricular ejection fraction, by estimation, is 60 to 65%. The left ventricle has normal function. The left ventricle has no regional wall motion abnormalities. The left ventricular internal cavity size was normal in size. There is mild concentric left ventricular hypertrophy. Left ventricular diastolic parameters are consistent with Grade II diastolic dysfunction (pseudonormalization).  Right Ventricle: The right ventricular size is normal. No increase in right ventricular wall thickness. Right ventricular systolic function is normal.  Left Atrium: Left atrial size was moderately dilated.  Right Atrium: Right atrial size was normal in size.  Pericardium: There is no evidence of pericardial  effusion.  Mitral Valve: The mitral valve is normal in structure. Mild mitral annular calcification. Mild mitral valve regurgitation. No evidence of mitral valve stenosis.  Tricuspid Valve: The tricuspid valve is normal in structure. Tricuspid valve regurgitation is mild . No evidence of tricuspid stenosis.  Aortic Valve: The aortic valve is tricuspid. There is mild calcification of the aortic valve. Aortic valve regurgitation is not visualized. Aortic valve sclerosis/calcification is present, without any evidence of aortic stenosis. Aortic valve mean gradient measures 3.0 mmHg. Aortic valve peak gradient measures 5.6 mmHg. Aortic valve area, by VTI measures 2.28 cm.  Pulmonic Valve: The pulmonic valve was normal in structure. Pulmonic valve regurgitation is mild. No evidence of pulmonic stenosis.  Aorta: Aortic dilatation noted. There is mild dilatation of the ascending aorta, measuring 42 mm.  Venous: The inferior vena cava is normal in size with greater than 50% respiratory variability, suggesting right atrial pressure of 3 mmHg.  IAS/Shunts: No atrial level shunt detected by color flow Doppler.   LEFT VENTRICLE PLAX 2D LVIDd:         4.90 cm   Diastology LVIDs:         3.60 cm   LV e' medial:    5.23 cm/s LV PW:         1.20 cm   LV E/e' medial:  20.8 LV IVS:        1.20 cm   LV e' lateral:   7.54 cm/s LVOT diam:     1.90 cm   LV E/e' lateral: 14.5 LV SV:         58 LV SV Index:   35 LVOT Area:     2.84 cm   RIGHT VENTRICLE             IVC RV Basal diam:  3.30 cm     IVC diam: 1.50 cm RV S prime:     13.10 cm/s TAPSE (M-mode): 2.1 cm  LEFT ATRIUM             Index        RIGHT ATRIUM           Index LA diam:        4.50 cm 2.73 cm/m   RA Area:     14.00 cm LA Vol (A2C):   64.4 ml 39.10 ml/m  RA Volume:   30.50 ml  18.53 ml/m LA Vol (A4C):   72.0 ml 43.74 ml/m LA Biplane Vol: 72.8  ml 44.23 ml/m AORTIC VALVE                    PULMONIC VALVE AV Area (Vmax):    2.27  cm     PV Vmax:          1.20 m/s AV Area (Vmean):   2.29 cm     PV Peak grad:     5.8 mmHg AV Area (VTI):     2.28 cm     PR End Diast Vel: 6.25 msec AV Vmax:           118.00 cm/s AV Vmean:          79.900 cm/s AV VTI:            0.252 m AV Peak Grad:      5.6 mmHg AV Mean Grad:      3.0 mmHg LVOT Vmax:         94.40 cm/s LVOT Vmean:        64.500 cm/s LVOT VTI:          0.203 m LVOT/AV VTI ratio: 0.81  AORTA Ao Root diam: 3.40 cm Ao Asc diam:  4.20 cm  MITRAL VALVE                TRICUSPID VALVE MV Area (PHT): 4.06 cm     TR Peak grad:   22.5 mmHg MV Decel Time: 187 msec     TR Vmax:        237.00 cm/s MV E velocity: 109.00 cm/s MV A velocity: 100.00 cm/s  SHUNTS MV E/A ratio:  1.09         Systemic VTI:  0.20 m Systemic Diam: 1.90 cm  Jules Oar MD Electronically signed by Jules Oar MD Signature Date/Time: 08/05/2023/2:23:26 PM    Final          ______________________________________________________________________________________________      Risk Assessment/Calculations:             Physical Exam:   VS:  BP (!) 114/58 (BP Location: Left Arm, Patient Position: Sitting, Cuff Size: Normal)   Pulse 64   Ht 5\' 4"  (1.626 m)   Wt 129 lb (58.5 kg)   SpO2 93%   BMI 22.14 kg/m    Wt Readings from Last 3 Encounters:  03/08/24 129 lb (58.5 kg)  01/18/24 127 lb (57.6 kg)  01/14/24 127 lb (57.6 kg)    GEN: Well nourished, well developed in no acute distress NECK: No JVD; No carotid bruits CARDIAC: RRR, no murmurs, rubs, gallops RESPIRATORY:  Clear to auscultation without rales, wheezing or rhonchi  ABDOMEN: Soft, non-tender, non-distended EXTREMITIES:  No edema; No deformity   ASSESSMENT AND PLAN: .    CAD: Denies any recent chest pain.  Continue aspirin and statin  Hypertension: Blood pressure stable  Hyperlipidemia: On simvastatin  Hypomagnesemia: Likely related to chronic GI issue.  She is continued to have soft stools and occasional  diarrhea.  Will order IV magnesium.        Dispo: Follow-up with Dr. Maximo Spar in 4 to 6 months  Signed, [place], [place]

## 2024-03-09 ENCOUNTER — Other Ambulatory Visit (HOSPITAL_COMMUNITY): Payer: Self-pay | Admitting: *Deleted

## 2024-03-09 ENCOUNTER — Other Ambulatory Visit: Payer: Self-pay | Admitting: Physician Assistant

## 2024-03-09 MED ORDER — MAGNESIUM SULFATE 50 % IJ SOLN
2.0000 g | INTRAVENOUS | Status: AC
Start: 2024-03-09 — End: 2024-03-30

## 2024-03-14 ENCOUNTER — Encounter (HOSPITAL_COMMUNITY)
Admission: RE | Admit: 2024-03-14 | Discharge: 2024-03-14 | Disposition: A | Discharge status: Home / Self Care | Source: Ambulatory Visit | Attending: Family Medicine | Admitting: Family Medicine

## 2024-03-14 DIAGNOSIS — R052 Subacute cough: Secondary | ICD-10-CM | POA: Diagnosis not present

## 2024-03-14 DIAGNOSIS — R062 Wheezing: Secondary | ICD-10-CM | POA: Insufficient documentation

## 2024-03-14 MED ORDER — MAGNESIUM SULFATE 2 GM/50ML IV SOLN
2.0000 g | INTRAVENOUS | Status: DC
  Administered 2024-03-14: 2 g via INTRAVENOUS
  Filled 2024-03-14: qty 50

## 2024-03-21 ENCOUNTER — Encounter (HOSPITAL_COMMUNITY)
Admission: RE | Admit: 2024-03-21 | Discharge: 2024-03-21 | Disposition: A | Discharge status: Home / Self Care | Source: Ambulatory Visit | Attending: Physician Assistant

## 2024-03-21 DIAGNOSIS — R062 Wheezing: Secondary | ICD-10-CM | POA: Diagnosis not present

## 2024-03-21 MED ORDER — MAGNESIUM SULFATE 2 GM/50ML IV SOLN
2.0000 g | INTRAVENOUS | Status: DC
  Administered 2024-03-21: 2 g via INTRAVENOUS
  Filled 2024-03-21: qty 50

## 2024-03-23 ENCOUNTER — Other Ambulatory Visit: Payer: Self-pay | Admitting: Family Medicine

## 2024-03-23 DIAGNOSIS — F419 Anxiety disorder, unspecified: Secondary | ICD-10-CM

## 2024-03-28 ENCOUNTER — Encounter (HOSPITAL_COMMUNITY)
Admission: RE | Admit: 2024-03-28 | Discharge: 2024-03-28 | Disposition: A | Discharge status: Home / Self Care | Source: Ambulatory Visit | Attending: Family Medicine | Admitting: Family Medicine

## 2024-03-28 DIAGNOSIS — R052 Subacute cough: Secondary | ICD-10-CM | POA: Insufficient documentation

## 2024-03-28 DIAGNOSIS — R062 Wheezing: Secondary | ICD-10-CM | POA: Diagnosis present

## 2024-03-28 MED ORDER — MAGNESIUM SULFATE 2 GM/50ML IV SOLN
2.0000 g | INTRAVENOUS | Status: DC
  Administered 2024-03-28: 2 g via INTRAVENOUS
  Filled 2024-03-28: qty 50

## 2024-03-29 ENCOUNTER — Ambulatory Visit: Admitting: Family Medicine

## 2024-03-29 ENCOUNTER — Other Ambulatory Visit: Payer: Self-pay | Admitting: Family Medicine

## 2024-03-29 DIAGNOSIS — F419 Anxiety disorder, unspecified: Secondary | ICD-10-CM

## 2024-04-01 LAB — MAGNESIUM: Magnesium: 1.2 mg/dL — ABNORMAL LOW (ref 1.6–2.3)

## 2024-04-03 ENCOUNTER — Other Ambulatory Visit: Payer: Self-pay | Admitting: Physician Assistant

## 2024-04-03 MED ORDER — MAGNESIUM SULFATE 50 % IJ SOLN
2.0000 g | INTRAVENOUS | Status: AC
Start: 2024-04-03 — End: 2024-04-24

## 2024-04-05 ENCOUNTER — Ambulatory Visit: Payer: Self-pay

## 2024-04-05 NOTE — Telephone Encounter (Addendum)
 Called patient regarding results, patient verbalized understanding   ----- Message from Ervin Heath sent at 04/03/2024  4:18 PM EDT ----- Magnesium  still low. Essentially the same as 1 month ago. Continue with weekly IV magnesium  infusion. I will place order, please notify patient to call infusion clinic

## 2024-04-06 ENCOUNTER — Other Ambulatory Visit (HOSPITAL_COMMUNITY): Payer: Self-pay | Admitting: *Deleted

## 2024-04-13 ENCOUNTER — Encounter (HOSPITAL_COMMUNITY)
Admission: RE | Admit: 2024-04-13 | Discharge: 2024-04-13 | Disposition: A | Discharge status: Home / Self Care | Source: Ambulatory Visit | Attending: Physician Assistant | Admitting: Physician Assistant

## 2024-04-13 DIAGNOSIS — R062 Wheezing: Secondary | ICD-10-CM | POA: Diagnosis not present

## 2024-04-13 MED ORDER — MAGNESIUM SULFATE 2 GM/50ML IV SOLN
2.0000 g | INTRAVENOUS | Status: DC
  Administered 2024-04-13: 2 g via INTRAVENOUS
  Filled 2024-04-13: qty 50

## 2024-04-20 ENCOUNTER — Encounter (HOSPITAL_COMMUNITY)
Admission: RE | Admit: 2024-04-20 | Discharge: 2024-04-20 | Disposition: A | Discharge status: Home / Self Care | Source: Ambulatory Visit | Attending: Physician Assistant | Admitting: Physician Assistant

## 2024-04-20 DIAGNOSIS — R062 Wheezing: Secondary | ICD-10-CM | POA: Diagnosis not present

## 2024-04-20 MED ORDER — MAGNESIUM SULFATE 2 GM/50ML IV SOLN
2.0000 g | INTRAVENOUS | Status: DC
  Administered 2024-04-20: 2 g via INTRAVENOUS
  Filled 2024-04-20: qty 50

## 2024-04-26 ENCOUNTER — Telehealth (HOSPITAL_COMMUNITY): Payer: Self-pay

## 2024-04-26 NOTE — Telephone Encounter (Signed)
 Auth Submission: NO AUTH NEEDED Site of care: Site of care: [facility] Payer: [employer] Medication & CPT/J Code(s) submitted: Magnesium  Route of submission (phone, fax, portal): portal Phone # Fax # Auth type: Buy/Bill PB Units/visits requested: 3 visits Reference number:  Approval from: 04/26/24 to 11/22/24

## 2024-04-27 ENCOUNTER — Telehealth: Payer: Self-pay | Admitting: Physician Assistant

## 2024-04-27 ENCOUNTER — Encounter (HOSPITAL_COMMUNITY)
Admission: RE | Admit: 2024-04-27 | Discharge: 2024-04-27 | Disposition: A | Discharge status: Home / Self Care | Source: Ambulatory Visit | Attending: Family Medicine | Admitting: Family Medicine

## 2024-04-27 ENCOUNTER — Other Ambulatory Visit: Payer: Self-pay

## 2024-04-27 DIAGNOSIS — R062 Wheezing: Secondary | ICD-10-CM | POA: Insufficient documentation

## 2024-04-27 DIAGNOSIS — R052 Subacute cough: Secondary | ICD-10-CM | POA: Insufficient documentation

## 2024-04-27 DIAGNOSIS — Z79899 Other long term (current) drug therapy: Secondary | ICD-10-CM

## 2024-04-27 MED ORDER — MAGNESIUM SULFATE 2 GM/50ML IV SOLN
2.0000 g | INTRAVENOUS | Status: DC
  Administered 2024-04-27: 2 g via INTRAVENOUS
  Filled 2024-04-27: qty 50

## 2024-04-27 NOTE — Telephone Encounter (Signed)
 Pt had infusion today and she said there is suppose to be a lab to check her magnesium . She said it is usually checked a day or two after the infusion.

## 2024-04-27 NOTE — Telephone Encounter (Signed)
 Will send to Dr Maximo Spar since Ervin Heath PA is out to get the order.

## 2024-04-27 NOTE — Telephone Encounter (Signed)
 Hazle Lites, MD  You6 minutes ago (3:23 PM)    Ok to order repeat magnesium  level tomorrow - please route to [employer].  Dr. Marvina Slough   Pt advised.

## 2024-04-29 LAB — MAGNESIUM: Magnesium: 1.9 mg/dL (ref 1.6–2.3)

## 2024-04-30 ENCOUNTER — Ambulatory Visit: Payer: Self-pay | Admitting: Physician Assistant

## 2024-04-30 NOTE — Telephone Encounter (Signed)
 Thanks. I reviewed it.

## 2024-04-30 NOTE — Progress Notes (Signed)
 Magnesium normalized.

## 2024-05-10 NOTE — Progress Notes (Signed)
 ERROR

## 2024-06-06 ENCOUNTER — Ambulatory Visit: Payer: Medicare HMO | Admitting: Dermatology

## 2024-06-06 DIAGNOSIS — L578 Other skin changes due to chronic exposure to nonionizing radiation: Secondary | ICD-10-CM

## 2024-06-06 DIAGNOSIS — R202 Paresthesia of skin: Secondary | ICD-10-CM

## 2024-06-06 DIAGNOSIS — L821 Other seborrheic keratosis: Secondary | ICD-10-CM

## 2024-06-06 DIAGNOSIS — Z86007 Personal history of in-situ neoplasm of skin: Secondary | ICD-10-CM

## 2024-06-06 DIAGNOSIS — L820 Inflamed seborrheic keratosis: Secondary | ICD-10-CM | POA: Diagnosis not present

## 2024-06-06 DIAGNOSIS — D229 Melanocytic nevi, unspecified: Secondary | ICD-10-CM

## 2024-06-06 DIAGNOSIS — B351 Tinea unguium: Secondary | ICD-10-CM

## 2024-06-06 DIAGNOSIS — Z1283 Encounter for screening for malignant neoplasm of skin: Secondary | ICD-10-CM | POA: Diagnosis not present

## 2024-06-06 DIAGNOSIS — I781 Nevus, non-neoplastic: Secondary | ICD-10-CM

## 2024-06-06 DIAGNOSIS — W908XXA Exposure to other nonionizing radiation, initial encounter: Secondary | ICD-10-CM

## 2024-06-06 DIAGNOSIS — D692 Other nonthrombocytopenic purpura: Secondary | ICD-10-CM

## 2024-06-06 DIAGNOSIS — L814 Other melanin hyperpigmentation: Secondary | ICD-10-CM | POA: Diagnosis not present

## 2024-06-06 DIAGNOSIS — D1801 Hemangioma of skin and subcutaneous tissue: Secondary | ICD-10-CM

## 2024-06-06 NOTE — Progress Notes (Signed)
 Follow-Up Visit   Subjective  Andrea Brooks is a 88 y.o. female who presents for the following: Skin Cancer Screening and Full Body Skin Exam  The patient presents for Upper-Body Skin Exam (UBSE) for skin cancer screening and mole check. The patient has spots, moles and lesions to be evaluated, some may be new or changing and the patient may have concern these could be cancer. She has a dark spot on her right 2nd toe that she noticed a year ago. She has an irritated spot on her back at bra line. She has rough spots on her arms and a new spot on her nose.    The following portions of the chart were reviewed this encounter and updated as appropriate: medications, allergies, medical history  Review of Systems:  No other skin or systemic complaints except as noted in HPI or Assessment and Plan.  Objective  Well appearing patient in no apparent distress; mood and affect are within normal limits.  A full examination was performed including scalp, head, eyes, ears, nose, lips, neck, chest, axillae, abdomen, back, bilateral upper extremities, feet, fingers, toes, fingernails, and toenails. All findings within normal limits unless otherwise noted below.   Relevant physical exam findings are noted in the Assessment and Plan.  R post shoulder x 2, R spinal mid back x 1, L post lower neck x 1, L forearm x 1, R forearm x 4, L upper sternum x 1, L frontal scalp x 1 (11) Erythematous stuck-on, waxy papule      Assessment & Plan   SKIN CANCER SCREENING PERFORMED TODAY.  ACTINIC DAMAGE - Chronic condition, secondary to cumulative UV/sun exposure - diffuse scaly erythematous macules with underlying dyspigmentation - Recommend daily broad spectrum sunscreen SPF 30+ to sun-exposed areas, reapply every 2 hours as needed.  - Staying in the shade or wearing long sleeves, sun glasses (UVA+UVB protection) and wide brim hats (4-inch brim around the entire circumference of the hat) are also recommended  for sun protection.  - Call for new or changing lesions.  LENTIGINES, SEBORRHEIC KERATOSES, HEMANGIOMAS - Benign normal skin lesions - Benign-appearing - Call for any changes  MELANOCYTIC NEVI - Tan-brown and/or pink-flesh-colored symmetric macules and papules - Benign appearing on exam today - Observation - Call clinic for new or changing moles - Recommend daily use of broad spectrum spf 30+ sunscreen to sun-exposed areas.   HISTORY OF SQUAMOUS CELL CARCINOMA IN SITU OF THE SKIN - No evidence of recurrence today - Recommend regular full body skin exams - Recommend daily broad spectrum sunscreen SPF 30+ to sun-exposed areas, reapply every 2 hours as needed.  - Call if any new or changing lesions are noted between office visits  TINEA UNGUIUM WITH ASSOCIATED TRAUMATIC PURPURA Exam: Vertical hyperpigmented streaks c/w purpura distal 50% R 2nd mid nail with thickening. See photo  Chronic and persistent condition with duration or expected duration over one year. Condition is symptomatic/ bothersome to patient. Not currently at goal.  Treatment Plan: No treatment. Not bothersome to patient. Photo taken today.   TELANGIECTASIA Exam: dilated blood vessel at the nasal tip  Treatment Plan: Benign appearing on exam Call for changes  NOTALGIA PARESTHETICA Exam: Perispinal hyperpigmented patch at right spinal mid back  Chronic condition without cure secondary to pinched nerve along spine causing itching or sensation changes in an area of skin. Chronic rubbing or scratching causes darkening of the skin.  OTC treatments which can help with itch include numbing creams like pramoxine or lidocaine   which temporarily reduce itch or Capsaicin-containing creams which cause a burning sensation but which sometimes over time will reset the nerves to stop producing itch.  If you choose to use Capsaicin cream, it is recommended to use it 5 times daily for 1 week followed by 3 times daily for 3-6 weeks.  You may have to continue using it long-term.  If not doing well with OTC options, could consider [facility] compounded prescription anti-itch cream with Amitriptyline 5% / Lidocaine  5% / Pramoxine 1% or Amitriptyline 5% / Gabapentin 10% / Lidocaine  5% Cream or other prescription cream or pill options.   INFLAMED SEBORRHEIC KERATOSIS (11) R post shoulder x 2, R spinal mid back x 1, L post lower neck x 1, L forearm x 1, R forearm x 4, L upper sternum x 1, L frontal scalp x 1 (11) Symptomatic, irritating, patient would like treated. Destruction of lesion - R post shoulder x 2, R spinal mid back x 1, L post lower neck x 1, L forearm x 1, R forearm x 4, L upper sternum x 1, L frontal scalp x 1 (11)  Destruction method: cryotherapy   Informed consent: discussed and consent obtained   Lesion destroyed using liquid nitrogen: Yes   Region frozen until ice ball extended beyond lesion: Yes   Outcome: patient tolerated procedure well with no complications   Post-procedure details: wound care instructions given   Additional details:  Prior to procedure, discussed risks of blister formation, small wound, skin dyspigmentation, or rare scar following cryotherapy. Recommend Vaseline ointment to treated areas while healing.   Return in about 1 year (around 06/06/2025) for TBSE, Hx SCC.  IAndrea Kerns, CMA, am acting as scribe for Rexene Rattler, MD .   Documentation: I have reviewed the above documentation for accuracy and completeness, and I agree with the above.  Rexene Rattler, MD

## 2024-06-06 NOTE — Patient Instructions (Signed)

## 2024-07-14 ENCOUNTER — Ambulatory Visit: Attending: Internal Medicine | Admitting: Internal Medicine

## 2024-07-14 ENCOUNTER — Encounter: Payer: Self-pay | Admitting: Internal Medicine

## 2024-07-14 VITALS — BP 96/50 | HR 66 | Ht 64.0 in | Wt 128.0 lb

## 2024-07-14 DIAGNOSIS — E785 Hyperlipidemia, unspecified: Secondary | ICD-10-CM | POA: Diagnosis not present

## 2024-07-14 DIAGNOSIS — I2510 Atherosclerotic heart disease of native coronary artery without angina pectoris: Secondary | ICD-10-CM

## 2024-07-14 DIAGNOSIS — I10 Essential (primary) hypertension: Secondary | ICD-10-CM | POA: Diagnosis not present

## 2024-07-14 DIAGNOSIS — E1159 Type 2 diabetes mellitus with other circulatory complications: Secondary | ICD-10-CM

## 2024-07-14 DIAGNOSIS — I152 Hypertension secondary to endocrine disorders: Secondary | ICD-10-CM

## 2024-07-14 DIAGNOSIS — R251 Tremor, unspecified: Secondary | ICD-10-CM

## 2024-07-14 MED ORDER — CARVEDILOL 3.125 MG PO TABS: 3.1250 mg | ORAL_TABLET | Freq: Two times a day (BID) | ORAL | 3 refills | Status: DC

## 2024-07-14 NOTE — Progress Notes (Signed)
 OFFICE NOTE  Chief Complaint:  Shakiness  Primary Care Physician: [name], MD  HPI:  Andrea Brooks is a 88 y.o. female with a past medial history significant for anxiety, type 2 diabetes, GERD, hypertension and dyslipidemia, who recently was admitted for persistent diarrhea, found to have an E. coli infection.  Subsequently after significant hydration she developed what was suspected to be acute diastolic congestive heart failure.  This required diuresis and she was seen by Dr. Shlomo and Dr. Raford.  She was noted to have elevated troponin was referred for heart catheterization which showed mild nonobstructive coronary disease.  As her husband is a patient of mine, she requested to follow-up with me.  She reports her diarrhea has improved significantly.  She saw Dr. Legrand, who is been working with her to treat that.  While hospitalized they recommended starting appropriate heart failure treatment.  An echo was performed which showed normal systolic function and mild diastolic dysfunction.  It was recommended she go on to low-dose metoprolol  and that her Vytorin 's be switched over to atorvastatin  for better LDL reduction.  She was leery of making those changes and wanted to discuss it with me further.  She did see her primary care provider in follow-up in a repeat lipid profile was performed.  This was 2 weeks ago and demonstrated total cholesterol of 85, triglycerides 128, HDL 24 and LDL 35.  At this point, it is not likely that she needs to switch cholesterol medications, and the low numbers are most likely related to her diarrhea over the past several weeks.  I suspect they are in fact falsely lowered because of this.  Overall though she feels better, denying any worsening shortness of breath or chest pain.  Pressure is mildly elevated 148/82 today.  She says is been running in the 140s at home.  10/18/2018  Andrea Brooks is seen today in routine follow-up.  Overall she is doing well.   She is down about 6 pounds since I last saw her.  Some of that was fluid related to her diastolic heart failure also after her significant E. coli infection she is not had much of an appetite and continues to have IBS-D symptoms.  Overall she denies any chest pain or worsening shortness of breath.  We did repeat some labs indicated a small increase in her lipid profile.  Total cholesterol now 120, triglycerides 162, HDL 31 and LDL 57.  She says she did have a decrease in metformin  due to improving hemoglobin A1c is 5.6.  EKG today shows sinus rhythm with left anterior fascicular block at 78.  04/14/2020  Andrea Brooks returns for follow-up.  She has no specific complaints today.  Dr. Legrand for her IBS-D symptoms.  The profile was in December 2020 which showed total cholesterol 102, triglycerides 133, HDL 28 and LDL of 50.  EKG shows sinus rhythm with first-degree AV block, left anterior fascicular block and voltage criteria for LVH at 61.  04/08/2021  Andrea Brooks is seen today in follow-up.  Unfortunately she had an eventful April.  She was hospitalized and found to have what was concerning for pneumatosis on a CT scan.  She was having abdominal pain.  She ended up going to surgery urgently but was not found to have any ischemic bowel.  Ultimately she has been placed on Flagyl  and is seemingly responding to antibiotic therapy.  She had also previously had a consultation up at the [facility] clinic and then was directed to  [facility].  Fortunately with all this she had no heart failure symptoms.  She seems to be stable on her current medications with good blood pressure control.  She denies any weight gain or edema.  EKG shows a sinus rhythm with first-degree AV block.  Her lipids in December were well controlled with total cholesterol of 88 and an LDL of 42.  10/05/2022  Andrea Brooks is seen today in follow-up.  Overall she seems to be doing well.  Blood pressure was a little elevated today 150/83.  EKG shows a sinus  bradycardia with some PACs for which she is unaware of.  She did have recent lipids that showed her cholesterol has been well controlled.  Her total cholesterol is only 79 3 weeks ago with triglycerides 101, HDL 31 and LDL 29.  She is on combination therapy Vytorin  10/20 mg daily.  She has had a number of GI issues.  01/05/2024  Andrea Brooks is seen today in follow-up with her husband.  Is only on a cruise.  She was sick on the ship and saw a provider in the infirmary.  She was provided azithromycin  and Augmentin and some cough medications.  COVID testing was negative.  Subsequently she followed up with her PCP.  She reports that she has not yet improved.  She is still coughing.  She reports shortness of breath and feeling like she is gasping for air when she lays back.  She has had increase her pillows to 2 at night to sleep.  She does have some chronic diastolic heart failure.  She reports being on furosemide  40 mg daily however he is close to running out of her medication.  She denies any lower extremity swelling.  She brought in several months of weights which show pretty stable weight and she has had no new edema.  07/14/2024  Andrea Brooks is seen today in follow-up.  She reports she has recently been very shaky.  She has been followed recently by Scot Ford, PA-C was noted that she has had very low magnesium  requiring frequent IV magnesium  repletion as she cannot tolerate oral magnesium .  She has not had any recent lab work since her magnesium  was repleted.  She said she felt shaky and recently has felt more tremulous.  She felt this way prior to having low magnesium  before.  He reports chronic frequent bowel movements which may be contributing to this as well as being on furosemide  which was listed at twice a day however she may take it once a day.  Also of note her blood pressure was low today at 96/50.  EKG shows bifascicular block with heart rates in the 60s.  She is on some carvedilol .  PMHx:  Past  Medical History:  Diagnosis Date   Anxiety    CHF (congestive heart failure) (HCC)    Colon polyps    Diabetes mellitus without complication (HCC)    Diverticulosis    Food poisoning    Gallstones    GERD (gastroesophageal reflux disease)    Hyperlipidemia    Hypertension    Non-ST elevation (NSTEMI) myocardial infarction (HCC)    Obesity    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   Left forearm and Left forearm superior    Past Surgical History:  Procedure Laterality Date   ABDOMINAL HYSTERECTOMY     total   BREAST BIOPSY Left    neg   CHOLECYSTECTOMY     JOINT REPLACEMENT Bilateral    knee  LAPAROTOMY N/A 03/11/2021   Procedure: EXPLORATORY LAPAROTOMY;  Surgeon: Signe Mitzie LABOR, MD;  Location: [facility] OR;  Service: General;  Laterality: N/A;   LEFT HEART CATH AND CORONARY ANGIOGRAPHY N/A 07/04/2018   Procedure: LEFT HEART CATH AND CORONARY ANGIOGRAPHY;  Surgeon: Dann Candyce RAMAN, MD;  Location: [facility] INVASIVE CV LAB;  Service: Cardiovascular;  Laterality: N/A;   REPLACEMENT TOTAL KNEE BILATERAL      FAMHx:  Family History  Problem Relation Age of Onset   Hypertension Mother    Colon cancer Mother    Stroke Father    Heart failure Father    Stroke Sister    Stroke Brother    Graves' disease Daughter    Breast cancer Paternal Aunt    Graves' disease Sister    Bone cancer Sister    Breast cancer Maternal Aunt    Esophageal cancer Neg Hx    Rectal cancer Neg Hx    Liver cancer Neg Hx     SOCHx:   reports that she quit smoking about 64 years ago. Her smoking use included cigarettes. She started smoking about 66 years ago. She has a 2 pack-year smoking history. She has been exposed to tobacco smoke. She has never used smokeless tobacco. She reports that she does not drink alcohol and does not use drugs.  ALLERGIES:  Allergies  Allergen Reactions   Tape Other (See Comments)    Skin tears easily.  OK with paper tape.   Magnesium -Containing Compounds  Diarrhea and Nausea And Vomiting    Can take Vitamins w/ magnesium  Does not tolerate po magnesium  by itself   Flagyl  [Metronidazole ] Other (See Comments)    Neuropathy with 250mg  BID dosing.  OK to take 250mg  QD    ROS: Pertinent items noted in HPI and remainder of comprehensive ROS otherwise negative.  HOME MEDS: Current Outpatient Medications on File Prior to Visit  Medication Sig Dispense Refill   acetaminophen  (TYLENOL ) 500 MG tablet Take 1 tablet (500 mg total) by mouth every 8 (eight) hours as needed for moderate pain. 20 tablet 0   aspirin  EC 81 MG tablet Take 81 mg by mouth daily.     benzonatate  (TESSALON ) 200 MG capsule Take 1 capsule (200 mg total) by mouth 2 (two) times daily as needed for cough. 20 capsule 0   furosemide  (LASIX ) 40 MG tablet Take 1 tablet (40 mg total) by mouth 2 (two) times daily.     guaiFENesin -dextromethorphan (ROBITUSSIN DM) 100-10 MG/5ML syrup Take 5 mLs by mouth every 4 (four) hours as needed for cough. 118 mL 0   HYDROcodone  bit-homatropine (HYCODAN) 5-1.5 MG/5ML syrup Take 5 mLs by mouth at bedtime as needed for cough. 120 mL 0   LORazepam  (ATIVAN ) 1 MG tablet TAKE 1/2 TO 1 TABLET AS    NEEDED FOR ANXIETY (MAXIMUMONCE DAILY) 30 tablet 0   Multiple Vitamins-Minerals (MULTI FOR HER 50+) TABS Take 1 tablet by mouth daily.     omeprazole  (PRILOSEC) 20 MG capsule TAKE 1 CAPSULE DAILY 30 MINUTES BEFORE BREAKFAST 90 capsule 3   polyethylene glycol powder (GLYCOLAX /MIRALAX ) 17 GM/SCOOP powder Take 8.5 g by mouth daily.     pyridostigmine  (MESTINON ) 60 MG tablet Take 60 mg by mouth daily.     simvastatin  (ZOCOR ) 20 MG tablet Take 1 tablet (20 mg total) by mouth at bedtime. 90 tablet 3   valsartan  (DIOVAN ) 320 MG tablet Take 1 tablet (320 mg total) by mouth daily. 90 tablet 3   calcium -vitamin D  (OSCAL WITH  D) 500-5 MG-MCG tablet Take 1 tablet by mouth 2 (two) times daily. (Patient not taking: Reported on 07/14/2024) 60 tablet 1   Vitamin D , Ergocalciferol ,  (DRISDOL ) 1.25 MG (50000 UNIT) CAPS capsule Take 1 capsule (50,000 Units total) by mouth every 7 (seven) days. (Patient not taking: Reported on 07/14/2024) 12 capsule 0   No current facility-administered medications on file prior to visit.    LABS/IMAGING: No results found for this or any previous visit (from the past 48 hours). No results found.  LIPID PANEL:    Component Value Date/Time   CHOL 118 02/04/2023 0944   TRIG 164 (H) 02/04/2023 0944   HDL 30 (L) 02/04/2023 0944   CHOLHDL 3.9 02/04/2023 0944   CHOLHDL 3.5 07/04/2018 0526   VLDL 26 07/04/2018 0526   LDLCALC 60 02/04/2023 0944     WEIGHTS: Wt Readings from Last 3 Encounters:  07/14/24 128 lb (58.1 kg)  03/08/24 129 lb (58.5 kg)  01/18/24 127 lb (57.6 kg)    VITALS: BP (!) 96/50   Pulse 66   Ht 5' 4 (1.626 m)   Wt 128 lb (58.1 kg)   SpO2 97%   BMI 21.97 kg/m   EXAM: General appearance: alert and no distress Neck: no carotid bruit, no JVD, and thyroid  not enlarged, symmetric, no tenderness/mass/nodules Lungs: clear to auscultation bilaterally Heart: regular rate and rhythm, S1, S2 normal, no murmur, click, rub or gallop Abdomen: soft, non-tender; bowel sounds normal; no masses,  no organomegaly Extremities: extremities normal, atraumatic, no cyanosis or edema Pulses: 2+ and symmetric Skin: Skin color, texture, turgor normal. No rashes or lesions Neurologic: Grossly normal Psych: Pleasant  EKG: EKG Interpretation Date/Time:  Friday July 14 2024 12:10:41 EDT Ventricular Rate:  66 PR Interval:  268 QRS Duration:  130 QT Interval:  446 QTC Calculation: 467 R Axis:   -76  Text Interpretation: Sinus rhythm with 1st degree A-V block Right bundle branch block Left anterior fascicular block Bifascicular block Possible Lateral infarct (cited on or before 05-Jan-2024) Cannot rule out Inferior infarct (cited on or before 05-Jan-2024) When compared with ECG of 05-Jan-2024 08:48, Abberant conduction is no longer  Present (RBBB and left anterior fascicular block) is now Present Confirmed by Mona Kent [phone]) on 07/14/2024 12:13:43 PM    ASSESSMENT: History of acute diastolic congestive heart failure (EF 60 to 65%, 06/2018) Dyslipidemia Hypertension Mild nonobstructive coronary disease (cath 06/2018) Small bowel disease Pneumatosis Hypomagnesemia  PLAN: 1.   Andrea Brooks says she feels shaky today and has been somewhat off recently.  She feels it might be due to low magnesium  which has been a problem in the recent past.  I will plan to repeat stat labs today including metabolic profile and magnesium .  Her blood pressure also was low.  I would advise decreasing her carvedilol  to 3.125 mg twice a day.  In addition she will check to see on her home dose of Lasix .  If she is on 40 mg twice a day I would advise decreasing it to once daily otherwise cutting the 40 mg down to 20 mg daily to see if that helps.  She will need close follow-up with Andrea Brooks, probably in a month.  Kent KYM Mona, MD, [facility], FNLA, FACP  [place]  [facility]  Medical Director of the [facility]  [facility] Diplomate of the American Board of Clinical Lipidology Attending Cardiologist  Direct Dial: [phone]  Fax: [phone]  Website:  [email or web address]   Kent  C Angella Montas 07/14/2024, 12:31 PM

## 2024-07-14 NOTE — Patient Instructions (Signed)
 Medication Instructions:  DECREASE carvedilol  to 3.125mg  twice daily  DECREASE lasix  -- if taking 40mg  twice daily >> decrease to once daily -- if taking 40mg  once daily >> decrease to 20mg  daily   *If you need a refill on your cardiac medications before your next appointment, please call your pharmacy*  Lab Work: BMET and magnesium  test today   If you have labs (blood work) drawn today and your tests are completely normal, you will receive your results only by: MyChart Message (if you have MyChart) OR A paper copy in the mail If you have any lab test that is abnormal or we need to change your treatment, we will call you to review the results.   Follow-Up: At [facility], you and your health needs are our priority.  As part of our continuing mission to provide you with exceptional heart care, our providers are all part of one team.  This team includes your primary Cardiologist (physician) and Advanced Practice Providers or APPs (Physician Assistants and Nurse Practitioners) who all work together to provide you with the care you need, when you need it.  Your next appointment:    1 month with Scot PA  We recommend signing up for the patient portal called MyChart.  Sign up information is provided on this After Visit Summary.  MyChart is used to connect with patients for Virtual Visits (Telemedicine).  Patients are able to view lab/test results, encounter notes, upcoming appointments, etc.  Non-urgent messages can be sent to your provider as well.   To learn more about what you can do with MyChart, go to [email or web address].   Other Instructions

## 2024-07-15 ENCOUNTER — Other Ambulatory Visit: Payer: Self-pay

## 2024-07-15 ENCOUNTER — Observation Stay (HOSPITAL_COMMUNITY)
Admission: EM | Admit: 2024-07-15 | Discharge: 2024-07-16 | Disposition: A | Discharge status: Home / Self Care | Attending: Obstetrics and Gynecology | Admitting: Obstetrics and Gynecology

## 2024-07-15 ENCOUNTER — Encounter (HOSPITAL_COMMUNITY): Payer: Self-pay | Admitting: Hospitalist

## 2024-07-15 ENCOUNTER — Telehealth: Payer: Self-pay

## 2024-07-15 ENCOUNTER — Ambulatory Visit: Payer: Self-pay | Admitting: Internal Medicine

## 2024-07-15 DIAGNOSIS — Z7982 Long term (current) use of aspirin: Secondary | ICD-10-CM | POA: Diagnosis not present

## 2024-07-15 DIAGNOSIS — I110 Hypertensive heart disease with heart failure: Secondary | ICD-10-CM | POA: Insufficient documentation

## 2024-07-15 DIAGNOSIS — Z96653 Presence of artificial knee joint, bilateral: Secondary | ICD-10-CM | POA: Insufficient documentation

## 2024-07-15 DIAGNOSIS — R799 Abnormal finding of blood chemistry, unspecified: Secondary | ICD-10-CM | POA: Diagnosis present

## 2024-07-15 DIAGNOSIS — I5032 Chronic diastolic (congestive) heart failure: Secondary | ICD-10-CM | POA: Diagnosis not present

## 2024-07-15 DIAGNOSIS — Z79899 Other long term (current) drug therapy: Secondary | ICD-10-CM | POA: Insufficient documentation

## 2024-07-15 DIAGNOSIS — I5033 Acute on chronic diastolic (congestive) heart failure: Secondary | ICD-10-CM

## 2024-07-15 DIAGNOSIS — E1159 Type 2 diabetes mellitus with other circulatory complications: Secondary | ICD-10-CM | POA: Diagnosis not present

## 2024-07-15 DIAGNOSIS — E119 Type 2 diabetes mellitus without complications: Secondary | ICD-10-CM

## 2024-07-15 LAB — MAGNESIUM
Magnesium: 0.6 mg/dL — CL (ref 1.6–2.3)
Magnesium: 0.6 mg/dL — CL (ref 1.7–2.4)
Magnesium: 2.9 mg/dL — ABNORMAL HIGH (ref 1.7–2.4)

## 2024-07-15 LAB — BASIC METABOLIC PANEL WITH GFR
BUN/Creatinine Ratio: 24 (ref 12–28)
BUN: 26 mg/dL (ref 8–27)
CO2: 24 mmol/L (ref 20–29)
Calcium: 6.5 mg/dL — CL (ref 8.7–10.3)
Chloride: 106 mmol/L (ref 96–106)
Creatinine, Ser: 1.08 mg/dL — ABNORMAL HIGH (ref 0.57–1.00)
Glucose: 99 mg/dL (ref 70–99)
Potassium: 3.7 mmol/L (ref 3.5–5.2)
Sodium: 146 mmol/L — ABNORMAL HIGH (ref 134–144)
eGFR: 50 mL/min/1.73 — ABNORMAL LOW (ref >59)

## 2024-07-15 LAB — CBC WITH DIFFERENTIAL/PLATELET
Abs Immature Granulocytes: 0.02 K/uL (ref 0.00–0.07)
Basophils Absolute: 0 K/uL (ref 0.0–0.1)
Basophils Relative: 1 %
Eosinophils Absolute: 0.1 K/uL (ref 0.0–0.5)
Eosinophils Relative: 2 %
HCT: 33.9 % — ABNORMAL LOW (ref 36.0–46.0)
Hemoglobin: 10.9 g/dL — ABNORMAL LOW (ref 12.0–15.0)
Immature Granulocytes: 0 %
Lymphocytes Relative: 22 %
Lymphs Abs: 1.4 K/uL (ref 0.7–4.0)
MCH: 29.9 pg (ref 26.0–34.0)
MCHC: 32.2 g/dL (ref 30.0–36.0)
MCV: 93.1 fL (ref 80.0–100.0)
Monocytes Absolute: 0.5 K/uL (ref 0.1–1.0)
Monocytes Relative: 8 %
Neutro Abs: 4.2 K/uL (ref 1.7–7.7)
Neutrophils Relative %: 67 %
Platelets: 207 K/uL (ref 150–400)
RBC: 3.64 MIL/uL — ABNORMAL LOW (ref 3.87–5.11)
RDW: 13.2 % (ref 11.5–15.5)
WBC: 6.3 K/uL (ref 4.0–10.5)
nRBC: 0 % (ref 0.0–0.2)

## 2024-07-15 LAB — MRSA NEXT GEN BY PCR, NASAL: MRSA by PCR Next Gen: NOT DETECTED

## 2024-07-15 LAB — COMPREHENSIVE METABOLIC PANEL WITH GFR
ALT: 12 U/L (ref 0–44)
AST: 21 U/L (ref 15–41)
Albumin: 2.8 g/dL — ABNORMAL LOW (ref 3.5–5.0)
Alkaline Phosphatase: 64 U/L (ref 38–126)
Anion gap: 6 (ref 5–15)
BUN: 25 mg/dL — ABNORMAL HIGH (ref 8–23)
CO2: 25 mmol/L (ref 22–32)
Calcium: 6.3 mg/dL — CL (ref 8.9–10.3)
Chloride: 110 mmol/L (ref 98–111)
Creatinine, Ser: 1.1 mg/dL — ABNORMAL HIGH (ref 0.44–1.00)
GFR, Estimated: 49 mL/min — ABNORMAL LOW (ref >60)
Glucose, Bld: 138 mg/dL — ABNORMAL HIGH (ref 70–99)
Potassium: 3.4 mmol/L — ABNORMAL LOW (ref 3.5–5.1)
Sodium: 141 mmol/L (ref 135–145)
Total Bilirubin: 0.4 mg/dL (ref 0.0–1.2)
Total Protein: 6.7 g/dL (ref 6.5–8.1)

## 2024-07-15 MED ORDER — SIMVASTATIN 20 MG PO TABS
20.0000 mg | ORAL_TABLET | Freq: Every day | ORAL | Status: DC
  Administered 2024-07-15: 20 mg via ORAL
  Filled 2024-07-15: qty 1

## 2024-07-15 MED ORDER — POLYETHYLENE GLYCOL 3350 17 GM/SCOOP PO POWD
8.5000 g | Freq: Every day | ORAL | Status: DC
  Filled 2024-07-15: qty 119

## 2024-07-15 MED ORDER — PYRIDOSTIGMINE BROMIDE 60 MG PO TABS
30.0000 mg | ORAL_TABLET | Freq: Every day | ORAL | Status: DC
Start: 2024-07-15 — End: 2024-07-16
  Administered 2024-07-15 – 2024-07-16 (×2): 30 mg via ORAL
  Filled 2024-07-15 (×3): qty 0.5

## 2024-07-15 MED ORDER — PYRIDOSTIGMINE BROMIDE 60 MG PO TABS
60.0000 mg | ORAL_TABLET | Freq: Every day | ORAL | Status: DC
  Filled 2024-07-15: qty 1

## 2024-07-15 MED ORDER — MAGNESIUM SULFATE 2 GM/50ML IV SOLN
2.0000 g | Freq: Once | INTRAVENOUS | Status: AC
  Administered 2024-07-15: 2 g via INTRAVENOUS
  Filled 2024-07-15: qty 50

## 2024-07-15 MED ORDER — PANTOPRAZOLE SODIUM 40 MG PO TBEC: 40.0000 mg | DELAYED_RELEASE_TABLET | Freq: Every day | ORAL | Status: DC

## 2024-07-15 MED ORDER — POTASSIUM CHLORIDE CRYS ER 20 MEQ PO TBCR
40.0000 meq | EXTENDED_RELEASE_TABLET | Freq: Once | ORAL | Status: AC
Start: 2024-07-15 — End: 2024-07-15
  Administered 2024-07-15: 40 meq via ORAL
  Filled 2024-07-15: qty 2

## 2024-07-15 MED ORDER — CALCIUM GLUCONATE-NACL 1-0.675 GM/50ML-% IV SOLN: 1.0000 g | Freq: Once | INTRAVENOUS | Status: DC

## 2024-07-15 MED ORDER — FAMOTIDINE 20 MG PO TABS
20.0000 mg | ORAL_TABLET | Freq: Every day | ORAL | Status: DC
  Administered 2024-07-15 – 2024-07-16 (×2): 20 mg via ORAL
  Filled 2024-07-15 (×2): qty 1

## 2024-07-15 MED ORDER — MAGNESIUM SULFATE 4 GM/100ML IV SOLN
4.0000 g | Freq: Once | INTRAVENOUS | Status: AC
  Administered 2024-07-15: 4 g via INTRAVENOUS
  Filled 2024-07-15: qty 100

## 2024-07-15 MED ORDER — ACETAMINOPHEN 500 MG PO TABS: 500.0000 mg | ORAL_TABLET | Freq: Three times a day (TID) | ORAL | Status: DC | PRN

## 2024-07-15 MED ORDER — IRBESARTAN 150 MG PO TABS
300.0000 mg | ORAL_TABLET | Freq: Every day | ORAL | Status: DC
  Administered 2024-07-15 – 2024-07-16 (×2): 300 mg via ORAL
  Filled 2024-07-15 (×2): qty 2

## 2024-07-15 MED ORDER — ASPIRIN 81 MG PO TBEC
81.0000 mg | DELAYED_RELEASE_TABLET | Freq: Every day | ORAL | Status: DC
  Administered 2024-07-15 – 2024-07-16 (×2): 81 mg via ORAL
  Filled 2024-07-15 (×2): qty 1

## 2024-07-15 MED ORDER — ENOXAPARIN SODIUM 40 MG/0.4ML IJ SOSY
40.0000 mg | PREFILLED_SYRINGE | INTRAMUSCULAR | Status: DC
  Administered 2024-07-15 – 2024-07-16 (×2): 40 mg via SUBCUTANEOUS
  Filled 2024-07-15 (×2): qty 0.4

## 2024-07-15 MED ORDER — CARVEDILOL 3.125 MG PO TABS
3.1250 mg | ORAL_TABLET | Freq: Two times a day (BID) | ORAL | Status: DC
  Administered 2024-07-15 – 2024-07-16 (×3): 3.125 mg via ORAL
  Filled 2024-07-15 (×3): qty 1

## 2024-07-15 MED ORDER — POLYETHYLENE GLYCOL 3350 17 G PO PACK
8.5000 g | PACK | Freq: Every day | ORAL | Status: DC
  Administered 2024-07-15 – 2024-07-16 (×2): 8.5 g via ORAL
  Filled 2024-07-15 (×2): qty 1

## 2024-07-15 MED ORDER — FUROSEMIDE 40 MG PO TABS
40.0000 mg | ORAL_TABLET | Freq: Two times a day (BID) | ORAL | Status: DC
  Administered 2024-07-15 (×2): 40 mg via ORAL
  Filled 2024-07-15 (×2): qty 1

## 2024-07-15 MED ORDER — METRONIDAZOLE 500 MG PO TABS
250.0000 mg | ORAL_TABLET | Freq: Every morning | ORAL | Status: DC
  Administered 2024-07-15 – 2024-07-16 (×2): 250 mg via ORAL
  Filled 2024-07-15 (×2): qty 1

## 2024-07-15 MED ORDER — CALCIUM GLUCONATE-NACL 2-0.675 GM/100ML-% IV SOLN
2.0000 g | Freq: Once | INTRAVENOUS | Status: AC
  Administered 2024-07-15: 2000 mg via INTRAVENOUS
  Filled 2024-07-15: qty 100

## 2024-07-15 NOTE — ED Provider Notes (Signed)
 [facility] AT [facility] Provider Note   CSN: [id number] Arrival date & time: 07/15/24  9541     Patient presents with: abnormal labs   Andrea Brooks is a 88 y.o. female.   The history is provided by the patient and the spouse.  Illness Location:  At home Quality:  Called by caardiology 1 hour ago due to abnormal calcium  and magnesium  and told to come immediately to the ED. labs drawn routinely Severity:  Moderate Onset quality:  Sudden Duration:  1 day Timing:  Constant Progression:  Unchanged Chronicity:  New Context:  Seen by cardiology and had routine labs Relieved by:  Nothing Worsened by:  Nothing Ineffective treatments:  None Associated symptoms: no chest pain, no fever, no rash, no shortness of breath and no wheezing   Patient with anxiety who was seen by cardiology who ordered routine labs and patient was contacted and told to come in as they were abnormal.      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 Authorizing Provider  acetaminophen  (TYLENOL ) 500 MG tablet Take 1 tablet (500 mg total) by mouth every 8 (eight) hours as needed for moderate pain. 08/09/23   Singh, Prashant K, MD  aspirin  EC 81 MG tablet Take 81 mg by mouth daily.    [provider]  benzonatate  (TESSALON ) 200 MG capsule Take 1 capsule (200 mg total) by mouth 2 (two) times daily as needed for cough. 01/18/24   Wallace Joesph LABOR, PA  calcium -vitamin D  (OSCAL WITH D) 500-5  MG-MCG tablet Take 1 tablet by mouth 2 (two) times daily. Patient not taking: Reported on 07/14/2024 08/13/23   Chandra Toribio POUR, MD  carvedilol  (COREG ) 3.125 MG tablet Take 1 tablet (3.125 mg total) by mouth 2 (two) times daily with a meal. 07/14/24   Hilty, Vinie BROCKS, MD  furosemide  (LASIX ) 40 MG tablet Take 1 tablet (40 mg total) by mouth 2 (two) times daily. 01/06/24 01/05/25  [name], MD  guaiFENesin -dextromethorphan (ROBITUSSIN DM) 100-10 MG/5ML syrup Take 5 mLs by mouth every 4 (four) hours as needed for cough. 12/31/23   Chandra Toribio POUR, MD  HYDROcodone  bit-homatropine [facility]) 5-1.5 MG/5ML syrup Take 5 mLs by mouth at bedtime as needed for cough. 12/31/23   Chandra Toribio POUR, MD  LORazepam  (ATIVAN ) 1 MG tablet TAKE 1/2 TO 1 TABLET AS    NEEDED FOR ANXIETY (MAXIMUMONCE DAILY) 03/24/24   Chandra Toribio POUR, MD  Multiple Vitamins-Minerals (MULTI FOR HER 50+) TABS Take 1 tablet by mouth daily.    [provider]  omeprazole  (PRILOSEC) 20 MG capsule TAKE 1 CAPSULE DAILY 30 MINUTES BEFORE BREAKFAST 09/21/22   Zehr, Jessica D, PA-C  polyethylene glycol powder (GLYCOLAX /MIRALAX ) 17 GM/SCOOP powder Take 8.5 g by mouth daily.    [provider]  pyridostigmine  (MESTINON ) 60 MG tablet Take 60 mg by mouth daily. 02/03/22   [provider]  simvastatin  (ZOCOR ) 20 MG tablet Take 1 tablet (20 mg total) by mouth at bedtime. 01/19/24   Hilty, Vinie BROCKS, MD  valsartan  (DIOVAN ) 320 MG tablet Take 1 tablet (320 mg total) by mouth daily. 01/19/24   Hilty, Vinie BROCKS, MD  Vitamin D , Ergocalciferol , (DRISDOL ) 1.25 MG (50000 UNIT) CAPS capsule Take 1 capsule (50,000 Units total) by mouth every 7 (seven) days. Patient not taking: Reported on 07/14/2024 09/16/23   Wallace Search A, PA    Allergies: Tape, Magnesium -containing compounds, and Flagyl  [metronidazole ]    Review of Systems  Constitutional:  Negative for fever.  Respiratory:  Negative for shortness of breath, wheezing and stridor.    Cardiovascular:  Negative for chest pain.  Skin:  Negative for rash.  All other systems reviewed and are negative.   Updated Vital Signs BP 119/70 (BP Location: Right Arm)   Pulse 74   Temp 98.3 F (36.8 C)   Resp (!) 22   Ht 5' 4 (1.626 m)   Wt 57.2 kg   SpO2 97%   BMI 21.63 kg/m   Physical Exam Vitals and nursing note reviewed.  Constitutional:      General: She is not in acute distress.    Appearance: Normal appearance. She is well-developed.  HENT:     Head: Normocephalic and atraumatic.     Nose: Nose normal.  Eyes:     Pupils: Pupils are equal, round, and reactive to light.  Cardiovascular:     Rate and Rhythm: Normal rate and regular rhythm.     Pulses: Normal pulses.     Heart sounds: Normal heart sounds.  Pulmonary:     Effort: No respiratory distress.     Breath sounds: Normal breath sounds.  Abdominal:     General: Bowel sounds are normal. There is no distension.     Palpations: Abdomen is soft.     Tenderness: There is no abdominal tenderness. There is no guarding or rebound.  Musculoskeletal:        General: Normal range of motion.     Cervical back: Normal range of motion and neck supple.  Skin:    General: Skin is warm and dry.     Capillary Refill: Capillary refill takes less than 2 seconds.     Findings: No erythema or rash.  Neurological:     General: No focal deficit present.     Mental Status: She is alert and oriented to person, place, and time.     Deep Tendon Reflexes: Reflexes normal.     Comments: No tetany      (all labs ordered are listed, but only abnormal results are displayed) Results for orders placed or performed during the hospital encounter of 07/15/24  CBC with Differential   Collection Time: 07/15/24  5:20 AM  Result Value Ref Range   WBC 6.3 4.0 - 10.5 K/uL   RBC 3.64 (L) 3.87 - 5.11 MIL/uL   Hemoglobin 10.9 (L) 12.0 - 15.0 g/dL   HCT 66.0 (L) 63.9 - 53.9 %   MCV 93.1 80.0 - 100.0 fL   MCH 29.9 26.0 - 34.0 pg    MCHC 32.2 30.0 - 36.0 g/dL   RDW 86.7 88.4 - 84.4 %   Platelets 207 150 - 400 K/uL   nRBC 0.0 0.0 - 0.2 %   Neutrophils Relative % 67 %   Neutro Abs 4.2 1.7 - 7.7 K/uL   Lymphocytes Relative 22 %   Lymphs Abs 1.4 0.7 - 4.0  K/uL   Monocytes Relative 8 %   Monocytes Absolute 0.5 0.1 - 1.0 K/uL   Eosinophils Relative 2 %   Eosinophils Absolute 0.1 0.0 - 0.5 K/uL   Basophils Relative 1 %   Basophils Absolute 0.0 0.0 - 0.1 K/uL   Immature Granulocytes 0 %   Abs Immature Granulocytes 0.02 0.00 - 0.07 K/uL  Comprehensive metabolic panel   Collection Time: 07/15/24  5:20 AM  Result Value Ref Range   Sodium 141 135 - 145 mmol/L   Potassium 3.4 (L) 3.5 - 5.1 mmol/L   Chloride 110 98 - 111 mmol/L   CO2 25 22 - 32 mmol/L   Glucose, Bld 138 (H) 70 - 99 mg/dL   BUN 25 (H) 8 - 23 mg/dL   Creatinine, Ser 8.89 (H) 0.44 - 1.00 mg/dL   Calcium  6.3 (LL) 8.9 - 10.3 mg/dL   Total Protein 6.7 6.5 - 8.1 g/dL   Albumin 2.8 (L) 3.5 - 5.0 g/dL   AST 21 15 - 41 U/L   ALT 12 0 - 44 U/L   Alkaline Phosphatase 64 38 - 126 U/L   Total Bilirubin 0.4 0.0 - 1.2 mg/dL   GFR, Estimated 49 (L) >60 mL/min   Anion gap 6 5 - 15  Magnesium    Collection Time: 07/15/24  5:20 AM  Result Value Ref Range   Magnesium  0.6 (LL) 1.7 - 2.4 mg/dL   No results found.  EKG:  EKG Interpretation Date/Time:  Saturday July 15 2024 05:36:44 EDT Ventricular Rate:  62 PR Interval:  287 QRS Duration:  135 QT Interval:  446 QTC Calculation: 453 R Axis:   -66  Text Interpretation: Sinus rhythm Prolonged PR interval Right bundle branch block Left ventricular hypertrophy Confirmed by Tymber Stallings ([id number]) on 07/15/2024 5:48:55 AM        Radiology: No results found.   .Critical Care  Performed by: Nettie Earing, MD Authorized by: Nettie Earing, MD   Critical care provider statement:    Critical care time (minutes):  30   Critical care end time:  07/15/2024 7:11 AM   Critical care was necessary to treat or  prevent imminent or life-threatening deterioration of the following conditions:  Endocrine crisis   Critical care was time spent personally by me on the following activities:  Development of treatment plan with patient or surrogate, discussions with consultants, evaluation of patient's response to treatment, examination of patient, ordering and review of laboratory studies, ordering and review of radiographic studies, ordering and performing treatments and interventions, pulse oximetry, re-evaluation of patient's condition and review of old charts   Care discussed with: admitting provider      Medications Ordered in the ED - No data to display                                  Medical Decision Making Called about low calcium  and low magnesium    Amount and/or Complexity of Data Reviewed Independent Historian: spouse    Details: See above  External Data Reviewed: notes.    Details: Previous notes  Labs: ordered.    Details: Normal sodium 141, potassium slight low 3.4, creatinine slight elevation 1.1 magnesium  critically 0.6 calcium  low 6.5   Risk Prescription drug management. Decision regarding hospitalization.     Final diagnoses:  None  No signs of systemic illness or infection. The patient is nontoxic-appearing on exam and vital signs are within normal limits.  I have reviewed the triage vital signs and the nursing notes. Pertinent labs & imaging results that were available during my care of the patient were reviewed by me and considered in my medical decision making (see chart for details). After history, exam, and medical workup I feel the patient has been appropriately medically screened and is safe for discharge home. Pertinent diagnoses were discussed with the patient. Patient was given return precautions.   ED Discharge Orders     None          Chaniqua Brisby, MD 07/15/24 9284

## 2024-07-15 NOTE — ED Triage Notes (Signed)
 Pt arrives POV after she was called an hour ago to come in to ED with abnormal labs of Mg 0.6 and Calcium  6.5. Pt had labs done by her cardiologist due to feeling shaky with a hx of low magnesium . Denies pain

## 2024-07-15 NOTE — Plan of Care (Signed)

## 2024-07-15 NOTE — Telephone Encounter (Signed)
 Received critical result from [facility]- Mg 0.6 and Calcium  6.5. Labs were checked yesterday after patient reported shakiness. She has a history of low Mg although this is lowest it has been, possibly due to history of frequent bowel movements and lasix  use. Given Mg <1.0 and concomitant low calcium  in the setting of ongoing symptoms, I called patient to advise coming to ED but no answer. I left a VM advising patient to present to ED given severity of electrolyte abnormality.

## 2024-07-15 NOTE — H&P (Addendum)
 History and Physical    Patient: Andrea Brooks FMW:[id number] DOB: 29-Apr-1936 DOA: 07/15/2024 DOS: the patient was seen and examined on 07/15/2024 PCP: [name], MD  Patient coming from: Home  Chief Complaint:  Chief Complaint  Patient presents with   abnormal labs   HPI: Andrea Brooks is a 88 y.o. 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  Pt was in [facility] until presenting to the hospital at 4:30 AM after receiving a call from Dr. Marty at the cardiac center, who informed the patient that their magnesium  and calcium  levels were critically low. Prior to the call, the patient experienced symptoms such as tingling in the fingers and lips, occasional lightheadedness, and a sensation of being disconnected from reality. The patient has a history of low magnesium , requiring approximately ten infusions in the past. The patient attempted magnesium  pills but experienced severe nausea and vomiting. Due to a previous condition involving blisters on the small intestine (pneumatosis cystoides), the patient has been taking flagyl  and pyridostigmine  daily. The patient reported a tremor in the right hand during episodes of low magnesium , which is present and has been confused with PD in th past (NOTE: PD w/u was negative). No new medications were started recently, and the patient has been taking Carvedilol  and Lasix , with a recent adjustment to the Lasix  dosage due to electrolyte loss (decreased from BID to daily). The patient takes a multivitamin daily and has been on Omeprazole  for about a year.  In the ED, pt hypertensive. Labs notable for K 3.4, Mg 0.6, calcium  6.3, and albumin 2.8. EKG w/ NSR and prolonged PR interval. EDP requested admission for IV Mg repletion given severely  low Mg.  Review of Systems: As mentioned in the history of present illness. All other systems reviewed and are negative. 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   Left forearm and Left forearm superior   Past Surgical History:  Procedure Laterality Date   ABDOMINAL HYSTERECTOMY     total   BREAST BIOPSY Left    neg   CHOLECYSTECTOMY     JOINT REPLACEMENT Bilateral    knee   LAPAROTOMY N/A 03/11/2021   Procedure: EXPLORATORY LAPAROTOMY;  Surgeon: Signe Mitzie LABOR, MD;  Location: [facility] OR;  Service: General;  Laterality: N/A;   LEFT HEART CATH AND CORONARY ANGIOGRAPHY N/A 07/04/2018   Procedure: LEFT HEART CATH AND CORONARY ANGIOGRAPHY;  Surgeon: Dann Candyce RAMAN, MD;  Location: [facility] INVASIVE CV LAB;  Service: Cardiovascular;  Laterality: N/A;   REPLACEMENT TOTAL KNEE BILATERAL     Social History:  reports that she quit smoking about 64 years ago. Her smoking use included cigarettes. She started smoking about 66 years ago. She has a 2 pack-year smoking history. She has been exposed to tobacco smoke. She has never used smokeless tobacco. She reports that she does not drink alcohol and  does not use drugs.  Allergies  Allergen Reactions   Tape Other (See Comments)    Skin tears easily.  OK with paper tape.   Magnesium -Containing Compounds Diarrhea and Nausea And Vomiting    Can take Vitamins w/ magnesium  Does not tolerate po magnesium  by itself   Flagyl  [Metronidazole ] Other (See Comments)    Neuropathy with 250mg  BID dosing.  OK to take 250mg  QD    Family History  Problem Relation Age of Onset   Hypertension Mother     Colon cancer Mother    Stroke Father    Heart failure Father    Stroke Sister    Stroke Brother    Graves' disease Daughter    Breast cancer Paternal Aunt    Graves' disease Sister    Bone cancer Sister    Breast cancer Maternal Aunt    Esophageal cancer Neg Hx    Rectal cancer Neg Hx    Liver cancer Neg Hx     Prior to Admission medications   Medication Sig Start Date End Date Taking? Authorizing Provider  acetaminophen  (TYLENOL ) 500 MG tablet Take 1 tablet (500 mg total) by mouth every 8 (eight) hours as needed for moderate pain. 08/09/23  Yes Dennise Lavada POUR, MD  aspirin  EC 81 MG tablet Take 81 mg by mouth daily.   Yes [provider]  furosemide  (LASIX ) 40 MG tablet Take 1 tablet (40 mg total) by mouth 2 (two) times daily. 01/06/24 01/05/25 Yes Hilty, Vinie BROCKS, MD  LORazepam  (ATIVAN ) 1 MG tablet TAKE 1/2 TO 1 TABLET AS    NEEDED FOR ANXIETY (MAXIMUMONCE DAILY) 03/24/24  Yes Chandra Toribio POUR, MD  metroNIDAZOLE  (FLAGYL ) 250 MG tablet Take 250 mg by mouth every morning.   Yes [provider]  Multiple Vitamins-Minerals (MULTI FOR HER 50+) TABS Take 1 tablet by mouth daily.   Yes [provider]  omeprazole  (PRILOSEC) 20 MG capsule TAKE 1 CAPSULE DAILY 30 MINUTES BEFORE BREAKFAST 09/21/22  Yes Zehr, Jessica D, PA-C  polyethylene glycol powder (GLYCOLAX /MIRALAX ) 17 GM/SCOOP powder Take 8.5 g by mouth as needed for moderate constipation.   Yes [provider]  pyridostigmine  (MESTINON ) 60 MG tablet Take 30 mg by mouth daily. 02/03/22  Yes [provider]  simvastatin  (ZOCOR ) 20 MG tablet Take 1 tablet (20 mg total) by mouth at bedtime. 01/19/24  Yes Hilty, Vinie BROCKS, MD  valsartan  (DIOVAN ) 320 MG tablet Take 1 tablet (320 mg total) by mouth daily. 01/19/24  Yes Hilty, Vinie BROCKS, MD  carvedilol  (COREG ) 3.125 MG tablet Take 1 tablet (3.125 mg total) by mouth 2 (two) times daily with a meal. 07/14/24   [name], MD    Physical Exam: Vitals:    07/15/24 0700 07/15/24 0800 07/15/24 0858 07/15/24 1100  BP: (!) 118/49 107/73 (!) 152/73 (!) 143/66  Pulse: 65 68 64 76  Resp: 16 17 14 15   Temp:   98.4 F (36.9 C) 97.8 F (36.6 C)  TempSrc:   Oral Oral  SpO2: 100% 99%  98%  Weight:   59.7 kg   Height:   5' 4 (1.626 m)    General: Alert, oriented x3, resting comfortably in no acute distress Respiratory: Lungs clear to auscultation bilaterally with normal respiratory effort; no w/r/r Cardiovascular: Regular rate and rhythm w/o m/r/g   Data Reviewed:  Lab Results  Component Value Date   WBC 6.3 07/15/2024   HGB 10.9 (L) 07/15/2024   HCT 33.9 (L) 07/15/2024  MCV 93.1 07/15/2024   PLT 207 07/15/2024   Lab Results  Component Value Date   GLUCOSE 138 (H) 07/15/2024   CALCIUM  6.3 (LL) 07/15/2024   NA 141 07/15/2024   K 3.4 (L) 07/15/2024   CO2 25 07/15/2024   CL 110 07/15/2024   BUN 25 (H) 07/15/2024   CREATININE 1.10 (H) 07/15/2024   Lab Results  Component Value Date   ALT 12 07/15/2024   AST 21 07/15/2024   ALKPHOS 64 07/15/2024   BILITOT 0.4 07/15/2024   Lab Results  Component Value Date   INR 1.28 07/03/2018   Radiology: No results found.  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  Hypomagnesemia Pt with severe hypoMg and has h/o this condition, but has been unable to tolerate oral Mg due to n/v/d and has instead used intermittent Mg transfusions; Etiology likely related to diarrhea (pt reports 4-5 BM/day) from rare GI condition above and diuresis and possibly PPI -IV Mg 2g on 8/22 -IV Mg 2g x1 and 4g on 8/23 -D/c PPI and start H2 antagonist (famotidine  20mg  daily) -OP f/u with PCP and GI to discuss alternative options for Mg repletion as well tx of pneumatosis cystoides vs  antidiarrheal meds to limit daily BM -Consider discharging on PO magnesium  glycinate given decreased reports of GI upset -F/u BMP/Mg q12h and replete prn  Hypokalemia Related to low Mg and diuresis Kdur 40mEq x1  HFpEF -PTA ASA, simvastatin , carvedilol  and irbesartan   Pneumatosis cystoides -PTA pyridostigmine  30mg  daily and flagyl  250mg  daily   Advance Care Planning:   Code Status: Full Code   Consults: N/A  Family Communication: Husband  Severity of Illness: The appropriate patient status for this patient is INPATIENT. Inpatient status is judged to be reasonable and necessary in order to provide the required intensity of service to ensure the patient's safety. The patient's presenting symptoms, physical exam findings, and initial radiographic and laboratory data in the context of their chronic comorbidities is felt to place them at high risk for further clinical deterioration. Furthermore, it is not anticipated that the patient will be medically stable for discharge from the hospital within 2 midnights of admission.   * I certify that at the point of admission it is my clinical judgment that the patient will require inpatient hospital care spanning beyond 2 midnights from the point of admission due to high intensity of service, high risk for further deterioration and high frequency of surveillance required.*   ------- I spent 70 minutes reviewing previous notes, at the bedside counseling/discussing the treatment plan, and performing clinical documentation.  Author: Marsha Ada, MD 07/15/2024 1:21 PM  For on call review [email or web address].

## 2024-07-15 NOTE — Telephone Encounter (Signed)
 [facility] called again to report Ca and Magnesium  results. Pt is already admitted to the hospital by IM. Glendia Ferrier, PA-C    07/15/2024 9:58 AM

## 2024-07-16 ENCOUNTER — Encounter (HOSPITAL_COMMUNITY): Payer: Self-pay | Admitting: Hospitalist

## 2024-07-16 DIAGNOSIS — E860 Dehydration: Secondary | ICD-10-CM | POA: Diagnosis not present

## 2024-07-16 DIAGNOSIS — K5651 Intestinal adhesions [bands], with partial obstruction: Secondary | ICD-10-CM | POA: Diagnosis not present

## 2024-07-16 LAB — BASIC METABOLIC PANEL WITH GFR
Anion gap: 9 (ref 5–15)
BUN: 18 mg/dL (ref 8–23)
CO2: 28 mmol/L (ref 22–32)
Calcium: 7.6 mg/dL — ABNORMAL LOW (ref 8.9–10.3)
Chloride: 103 mmol/L (ref 98–111)
Creatinine, Ser: 1.01 mg/dL — ABNORMAL HIGH (ref 0.44–1.00)
GFR, Estimated: 55 mL/min — ABNORMAL LOW
Glucose, Bld: 92 mg/dL (ref 70–99)
Potassium: 4.3 mmol/L (ref 3.5–5.1)
Sodium: 140 mmol/L (ref 135–145)

## 2024-07-16 LAB — MAGNESIUM: Magnesium: 2.6 mg/dL — ABNORMAL HIGH (ref 1.7–2.4)

## 2024-07-16 MED ORDER — FUROSEMIDE 40 MG PO TABS: 40.0000 mg | ORAL_TABLET | Freq: Every day | ORAL | Status: DC

## 2024-07-16 MED ORDER — MAGNESIUM 200 MG PO CHEW: 400.0000 mg | CHEWABLE_TABLET | Freq: Two times a day (BID) | ORAL | 1 refills | Status: DC

## 2024-07-16 NOTE — Discharge Summary (Signed)
 Andrea Brooks FMW:[id number] DOB: 1936/05/04 DOA: 07/15/2024  PCP: [name], MD  Admit date: 07/15/2024 Discharge date: 07/16/2024  Time spent: 35 minutes  Recommendations for Outpatient Follow-up:  Pcp f/u 1 week, check electrolytes then Cardiology and GI f/u     Discharge Diagnoses:  Principal Problem:   Hypomagnesemia Active Problems:   Hypertension associated with diabetes (HCC)   Diabetes mellitus without complication (HCC)   Chronic diastolic CHF (congestive heart failure) (HCC)   Discharge Condition: improved  Diet recommendation: heart healthy  Filed Weights   07/15/24 0507 07/15/24 0858 07/16/24 0438  Weight: 57.2 kg 59.7 kg 58.6 kg    History of present illness:  From admission h and p Andrea Brooks is a 88 y.o. 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   Pt was in [facility] until presenting to the hospital at 4:30 AM after receiving a call from Dr. Marty at the cardiac center, who informed the patient that their magnesium  and calcium  levels were critically low. Prior to the call, the patient experienced symptoms such as tingling in the fingers and lips, occasional lightheadedness, and a sensation of being disconnected from reality. The patient has a history of low magnesium , requiring approximately ten infusions in the past. The patient attempted magnesium  pills but experienced severe nausea and vomiting. Due to a previous condition involving blisters on the small intestine (pneumatosis cystoides), the patient has been taking flagyl  and pyridostigmine  daily. The patient reported a tremor in the right hand during episodes of low magnesium , which is present and has been confused with PD in th past (NOTE: PD w/u was negative). No new medications  were started recently, and the patient has been taking Carvedilol  and Lasix , with a recent adjustment to the Lasix  dosage due to electrolyte loss (decreased from BID to daily). The patient takes a multivitamin daily and has been on Omeprazole  for about a year.   In the ED, pt hypertensive. Labs notable for K 3.4, Mg 0.6, calcium  6.3, and albumin 2.8. EKG w/ NSR and prolonged PR interval. EDP requested admission for IV Mg repletion given severely low Mg.  Hospital Course:  Patient presents referred from clinic for low electrolytes. Magnesium  severely low, also low calcium . Both were repleted. Symptoms on presentation have resolved, patient is feeling back to baseline. Chronic diarrhea likely main cause of hypomagnesemia which in turn causes hypocalcemia. Lasix  likely also contributes, as could ppi. We will re-start a magnesium  supplement, patient didn't tolerate former formulations so we will try a chewable tablet. We discussed decreasing frequency of miralax  and pyridostigmine  with goal of fewer bowel movements (currently having as many as 6 a day), with close GI f/u. Her small bowel problem may also involve malabsorption which can contribute to hypomagnesemia. We will also continue cardiology's plan to decrease dose of lasix , and should consider trial of discontinuing that med if hypomagnesmia fails to respond to these changes. We will also hold patient's PPI for now. Will need frequent monitoring of electrolytes until things stabilize. D/c plan reviewed with husband and daughter who are in agreement.   Procedures: none   Consultations: none  Discharge Exam: Vitals:   07/16/24 0802 07/16/24 1155  BP: (!) 109/57 108/68  Pulse: 79 73  Resp: 20 16  Temp: 97.9 F (36.6 C) 97.8 F (36.6 C)  SpO2: 99% 98%    General: NAD Cardiovascular: RRR Respiratory: CTAB  Discharge Instructions   Discharge Instructions     Diet - low sodium heart healthy   Complete by: As directed    Increase  activity slowly   Complete by: As directed       Allergies as of 07/16/2024       Reactions   Tape Other (See Comments)   Skin tears easily.  OK with paper tape.   Magnesium -containing Compounds Diarrhea, Nausea And Vomiting   Can take Vitamins w/ magnesium  Does not tolerate po magnesium  by itself   Flagyl  [metronidazole ] Other (See Comments)   Neuropathy with 250mg  BID dosing.  OK to take 250mg  QD        Medication List     PAUSE taking these medications    omeprazole  20 MG capsule Wait to take this until your doctor or other care provider tells you to start again. Commonly known as: PRILOSEC TAKE 1 CAPSULE DAILY 30 MINUTES BEFORE BREAKFAST       TAKE these medications    Acetaminophen  Extra Strength 500 MG Tabs Take 1 tablet (500 mg total) by mouth every 8 (eight) hours as needed for moderate pain.   aspirin  EC 81 MG tablet Take 81 mg by mouth daily.   carvedilol  3.125 MG tablet Commonly known as: COREG  Take 1 tablet (3.125 mg total) by mouth 2 (two) times daily with a meal.   furosemide  40 MG tablet Commonly known as: Lasix  Take 1 tablet (40 mg total) by mouth daily. What changed: when to take this   LORazepam  1 MG tablet Commonly known as: ATIVAN  TAKE 1/2 TO 1 TABLET AS    NEEDED FOR ANXIETY (MAXIMUMONCE DAILY)   Magnesium  200 MG Chew Chew 400 mg by mouth 2 (two) times daily.   metroNIDAZOLE  250 MG tablet Commonly known as: FLAGYL  Take 250 mg by mouth every morning.   Multi For Her 50+ Tabs Take 1 tablet by mouth daily.   polyethylene glycol powder 17 GM/SCOOP powder Commonly known as: GLYCOLAX /MIRALAX  Take 8.5 g by mouth as needed for moderate constipation.   pyridostigmine  60 MG tablet Commonly known as: MESTINON  Take 30 mg by mouth daily.   simvastatin  20 MG tablet Commonly known as: ZOCOR  Take 1 tablet (20 mg total) by mouth at bedtime.   valsartan  320 MG tablet Commonly known as: DIOVAN  Take 1 tablet (320 mg total) by mouth  daily.       Allergies  Allergen Reactions   Tape Other (See Comments)    Skin tears easily.  OK with paper tape.   Magnesium -Containing Compounds Diarrhea and Nausea And Vomiting    Can take Vitamins w/ magnesium  Does not tolerate po magnesium  by itself   Flagyl  [Metronidazole ] Other (See Comments)    Neuropathy with 250mg  BID dosing.  OK to take 250mg  QD    Follow-up Information     Hilty, Vinie BROCKS, MD Follow up.   Specialty: Cardiology Why: 1 week Contact information: [address] [place] [address] [place] [address] [phone]         Your cardiologist and your gastroenterologist Follow up.                   The results of significant diagnostics from this hospitalization (including imaging, microbiology, ancillary and laboratory) are listed below for reference.    Significant Diagnostic Studies: No results found.  Microbiology: Recent Results (from the past 240 hours)  MRSA Next Gen by  PCR, Nasal     Status: None   Collection Time: 07/15/24  9:05 AM   Specimen: Nasal Mucosa; Nasal Swab  Result Value Ref Range Status   MRSA by PCR Next Gen NOT DETECTED NOT DETECTED Final    Comment: (NOTE) The GeneXpert MRSA Assay (FDA approved for NASAL specimens only), is one component of a comprehensive MRSA colonization surveillance program. It is not intended to diagnose MRSA infection nor to guide or monitor treatment for MRSA infections. Test performance is not FDA approved in patients less than 40 years old. Performed at [facility], [address], [place], [place] [address]      Labs: Basic Metabolic Panel: Recent Labs  Lab 07/14/24 1315 07/15/24 0520 07/15/24 2017 07/16/24 0221  NA 146* 141  --  140  K 3.7 3.4*  --  4.3  CL 106 110  --  103  CO2 24 25  --  28  GLUCOSE 99 138*  --  92  BUN 26 25*  --  18  CREATININE 1.08* 1.10*  --  1.01*  CALCIUM  6.5* 6.3*  --  7.6*  MG 0.6* 0.6* 2.9* 2.6*   Liver Function Tests: Recent Labs   Lab 07/15/24 0520  AST 21  ALT 12  ALKPHOS 64  BILITOT 0.4  PROT 6.7  ALBUMIN 2.8*   No results for input(s): LIPASE, AMYLASE in the last 168 hours. No results for input(s): AMMONIA in the last 168 hours. CBC: Recent Labs  Lab 07/15/24 0520  WBC 6.3  NEUTROABS 4.2  HGB 10.9*  HCT 33.9*  MCV 93.1  PLT 207   Cardiac Enzymes: No results for input(s): CKTOTAL, CKMB, CKMBINDEX, TROPONINI in the last 168 hours. BNP: BNP (last 3 results) Recent Labs    08/09/23 0411 01/05/24 1018 01/14/24 1150  BNP 122.5* 1,424.6* 58.2    ProBNP (last 3 results) No results for input(s): PROBNP in the last 8760 hours.  CBG: No results for input(s): GLUCAP in the last 168 hours.     Signed:  Devaughn KATHEE Ban MD.  [facility] 07/16/2024, 1:38 PM

## 2024-07-16 NOTE — Care Management CC44 (Signed)
 Condition Code 44 Documentation Completed  Patient Details  Name: Andrea Brooks MRN: [id number] Date of Birth: Apr 03, 1936   Condition Code 44 given:  Yes Patient signature on Condition Code 44 notice:  Yes Documentation of 2 MD's agreement:  Yes Code 44 added to claim:  Yes    Robynn Eileen Hoose, RN 07/16/2024, 2:10 PM

## 2024-07-16 NOTE — Plan of Care (Signed)
  Problem: Education: Goal: Knowledge of General Education information will improve Description: Including pain rating scale, medication(s)/side effects and non-pharmacologic comfort measures Outcome: Progressing   Problem: Clinical Measurements: Goal: Ability to maintain clinical measurements within normal limits will improve Outcome: Progressing Goal: Will remain free from infection Outcome: Progressing Goal: Diagnostic test results will improve Outcome: Progressing Goal: Cardiovascular complication will be avoided Outcome: Progressing   

## 2024-07-16 NOTE — Progress Notes (Signed)
   Seen for social visit today- seems better. Magnesium  is repleted, calcium  is improving, creatinine lower. I advised decreasing her lasix  to 40 mg daily in the office Friday. This may help with magnesium  loss and she did not seem volume overloaded. Agree with trial off PPI if she can tolerate off of it - weaning off PPI is difficult, even with switching to H2 blocker. Reasonable to try different oral formulation of magnesium  - she is agreeable to this. C/o some ear fullness today- suspect chronic sinus congestion. She uses a nasal spray for this, but does not have it here.  Thanks for caring for her.  Vinie KYM Maxcy, MD, [facility], FNLA, FACP  [place]  [facility]  Medical Director of the [facility]  Card[facility] Reduction [facility] Diplomate of the American Board of Clinical Lipidology Attending Cardiologist  Direct Dial: [phone]  Fax: [phone]  Website:  [email or web address]

## 2024-07-16 NOTE — Care Management Obs Status (Signed)
 MEDICARE OBSERVATION STATUS NOTIFICATION   Patient Details  Name: Krystalynn Ridgeway MRN: [id number] Date of Birth: [birth year]   Medicare Observation Status Notification Given:  Yes    Robynn Eileen Hoose, RN 07/16/2024, 2:10 PM

## 2024-07-19 ENCOUNTER — Inpatient Hospital Stay (HOSPITAL_COMMUNITY)
Admission: EM | Admit: 2024-07-19 | Discharge: 2024-07-24 | DRG: 388 | Disposition: A | Discharge status: Home Health | Attending: Internal Medicine | Admitting: Internal Medicine

## 2024-07-19 ENCOUNTER — Other Ambulatory Visit: Payer: Self-pay

## 2024-07-19 ENCOUNTER — Emergency Department (HOSPITAL_COMMUNITY)

## 2024-07-19 DIAGNOSIS — Z8719 Personal history of other diseases of the digestive system: Secondary | ICD-10-CM

## 2024-07-19 DIAGNOSIS — Z90710 Acquired absence of both cervix and uterus: Secondary | ICD-10-CM

## 2024-07-19 DIAGNOSIS — N179 Acute kidney failure, unspecified: Secondary | ICD-10-CM | POA: Diagnosis present

## 2024-07-19 DIAGNOSIS — K5730 Diverticulosis of large intestine without perforation or abscess without bleeding: Secondary | ICD-10-CM | POA: Diagnosis present

## 2024-07-19 DIAGNOSIS — Z9049 Acquired absence of other specified parts of digestive tract: Secondary | ICD-10-CM

## 2024-07-19 DIAGNOSIS — E785 Hyperlipidemia, unspecified: Secondary | ICD-10-CM | POA: Diagnosis present

## 2024-07-19 DIAGNOSIS — R112 Nausea with vomiting, unspecified: Secondary | ICD-10-CM

## 2024-07-19 DIAGNOSIS — I2489 Other forms of acute ischemic heart disease: Secondary | ICD-10-CM | POA: Diagnosis present

## 2024-07-19 DIAGNOSIS — E1122 Type 2 diabetes mellitus with diabetic chronic kidney disease: Secondary | ICD-10-CM | POA: Diagnosis present

## 2024-07-19 DIAGNOSIS — K638219 Small intestinal bacterial overgrowth, unspecified: Secondary | ICD-10-CM | POA: Diagnosis present

## 2024-07-19 DIAGNOSIS — Z8349 Family history of other endocrine, nutritional and metabolic diseases: Secondary | ICD-10-CM

## 2024-07-19 DIAGNOSIS — E872 Acidosis, unspecified: Secondary | ICD-10-CM | POA: Diagnosis present

## 2024-07-19 DIAGNOSIS — Z96653 Presence of artificial knee joint, bilateral: Secondary | ICD-10-CM | POA: Diagnosis present

## 2024-07-19 DIAGNOSIS — Z808 Family history of malignant neoplasm of other organs or systems: Secondary | ICD-10-CM

## 2024-07-19 DIAGNOSIS — Z86007 Personal history of in-situ neoplasm of skin: Secondary | ICD-10-CM

## 2024-07-19 DIAGNOSIS — I130 Hypertensive heart and chronic kidney disease with heart failure and stage 1 through stage 4 chronic kidney disease, or unspecified chronic kidney disease: Secondary | ICD-10-CM | POA: Diagnosis present

## 2024-07-19 DIAGNOSIS — F419 Anxiety disorder, unspecified: Secondary | ICD-10-CM | POA: Diagnosis present

## 2024-07-19 DIAGNOSIS — E1165 Type 2 diabetes mellitus with hyperglycemia: Secondary | ICD-10-CM | POA: Diagnosis present

## 2024-07-19 DIAGNOSIS — D72829 Elevated white blood cell count, unspecified: Secondary | ICD-10-CM | POA: Diagnosis present

## 2024-07-19 DIAGNOSIS — K6389 Other specified diseases of intestine: Secondary | ICD-10-CM | POA: Diagnosis present

## 2024-07-19 DIAGNOSIS — K219 Gastro-esophageal reflux disease without esophagitis: Secondary | ICD-10-CM | POA: Diagnosis present

## 2024-07-19 DIAGNOSIS — Z8249 Family history of ischemic heart disease and other diseases of the circulatory system: Secondary | ICD-10-CM

## 2024-07-19 DIAGNOSIS — I10 Essential (primary) hypertension: Secondary | ICD-10-CM | POA: Diagnosis not present

## 2024-07-19 DIAGNOSIS — E875 Hyperkalemia: Secondary | ICD-10-CM | POA: Diagnosis present

## 2024-07-19 DIAGNOSIS — I252 Old myocardial infarction: Secondary | ICD-10-CM

## 2024-07-19 DIAGNOSIS — E860 Dehydration: Principal | ICD-10-CM | POA: Diagnosis present

## 2024-07-19 DIAGNOSIS — I440 Atrioventricular block, first degree: Secondary | ICD-10-CM | POA: Diagnosis present

## 2024-07-19 DIAGNOSIS — R54 Age-related physical debility: Secondary | ICD-10-CM | POA: Diagnosis present

## 2024-07-19 DIAGNOSIS — K5651 Intestinal adhesions [bands], with partial obstruction: Principal | ICD-10-CM | POA: Diagnosis present

## 2024-07-19 DIAGNOSIS — D649 Anemia, unspecified: Secondary | ICD-10-CM | POA: Diagnosis present

## 2024-07-19 DIAGNOSIS — Z87891 Personal history of nicotine dependence: Secondary | ICD-10-CM

## 2024-07-19 DIAGNOSIS — R7989 Other specified abnormal findings of blood chemistry: Secondary | ICD-10-CM

## 2024-07-19 DIAGNOSIS — K56600 Partial intestinal obstruction, unspecified as to cause: Secondary | ICD-10-CM | POA: Diagnosis not present

## 2024-07-19 DIAGNOSIS — R571 Hypovolemic shock: Secondary | ICD-10-CM | POA: Diagnosis present

## 2024-07-19 DIAGNOSIS — Z803 Family history of malignant neoplasm of breast: Secondary | ICD-10-CM

## 2024-07-19 DIAGNOSIS — K599 Functional intestinal disorder, unspecified: Secondary | ICD-10-CM | POA: Diagnosis not present

## 2024-07-19 DIAGNOSIS — I5030 Unspecified diastolic (congestive) heart failure: Secondary | ICD-10-CM

## 2024-07-19 DIAGNOSIS — I2510 Atherosclerotic heart disease of native coronary artery without angina pectoris: Secondary | ICD-10-CM | POA: Diagnosis present

## 2024-07-19 DIAGNOSIS — I5032 Chronic diastolic (congestive) heart failure: Secondary | ICD-10-CM | POA: Diagnosis present

## 2024-07-19 DIAGNOSIS — A419 Sepsis, unspecified organism: Secondary | ICD-10-CM

## 2024-07-19 DIAGNOSIS — R579 Shock, unspecified: Secondary | ICD-10-CM | POA: Diagnosis present

## 2024-07-19 DIAGNOSIS — K567 Ileus, unspecified: Secondary | ICD-10-CM | POA: Diagnosis present

## 2024-07-19 DIAGNOSIS — Z823 Family history of stroke: Secondary | ICD-10-CM

## 2024-07-19 DIAGNOSIS — Z860100 Personal history of colon polyps, unspecified: Secondary | ICD-10-CM

## 2024-07-19 DIAGNOSIS — I358 Other nonrheumatic aortic valve disorders: Secondary | ICD-10-CM | POA: Diagnosis present

## 2024-07-19 DIAGNOSIS — Z79899 Other long term (current) drug therapy: Secondary | ICD-10-CM

## 2024-07-19 DIAGNOSIS — Z881 Allergy status to other antibiotic agents status: Secondary | ICD-10-CM

## 2024-07-19 DIAGNOSIS — Z888 Allergy status to other drugs, medicaments and biological substances status: Secondary | ICD-10-CM

## 2024-07-19 DIAGNOSIS — I4510 Unspecified right bundle-branch block: Secondary | ICD-10-CM | POA: Diagnosis present

## 2024-07-19 DIAGNOSIS — Z7982 Long term (current) use of aspirin: Secondary | ICD-10-CM

## 2024-07-19 DIAGNOSIS — Z91048 Other nonmedicinal substance allergy status: Secondary | ICD-10-CM

## 2024-07-19 DIAGNOSIS — N1831 Chronic kidney disease, stage 3a: Secondary | ICD-10-CM | POA: Diagnosis not present

## 2024-07-19 DIAGNOSIS — Z85828 Personal history of other malignant neoplasm of skin: Secondary | ICD-10-CM

## 2024-07-19 DIAGNOSIS — Z8619 Personal history of other infectious and parasitic diseases: Secondary | ICD-10-CM

## 2024-07-19 DIAGNOSIS — R578 Other shock: Secondary | ICD-10-CM | POA: Diagnosis not present

## 2024-07-19 DIAGNOSIS — N1832 Chronic kidney disease, stage 3b: Secondary | ICD-10-CM | POA: Diagnosis present

## 2024-07-19 DIAGNOSIS — Z9889 Other specified postprocedural states: Secondary | ICD-10-CM

## 2024-07-19 DIAGNOSIS — K660 Peritoneal adhesions (postprocedural) (postinfection): Secondary | ICD-10-CM

## 2024-07-19 DIAGNOSIS — Z800 Family history of malignant neoplasm of digestive organs: Secondary | ICD-10-CM

## 2024-07-19 LAB — COMPREHENSIVE METABOLIC PANEL WITH GFR
ALT: 12 U/L (ref 0–44)
AST: 23 U/L (ref 15–41)
Albumin: 3.3 g/dL — ABNORMAL LOW (ref 3.5–5.0)
Alkaline Phosphatase: 58 U/L (ref 38–126)
Anion gap: 12 (ref 5–15)
BUN: 52 mg/dL — ABNORMAL HIGH (ref 8–23)
CO2: 26 mmol/L (ref 22–32)
Calcium: 9 mg/dL (ref 8.9–10.3)
Chloride: 97 mmol/L — ABNORMAL LOW (ref 98–111)
Creatinine, Ser: 1.83 mg/dL — ABNORMAL HIGH (ref 0.44–1.00)
GFR, Estimated: 27 mL/min — ABNORMAL LOW (ref >60)
Glucose, Bld: 171 mg/dL — ABNORMAL HIGH (ref 70–99)
Potassium: 5.8 mmol/L — ABNORMAL HIGH (ref 3.5–5.1)
Sodium: 135 mmol/L (ref 135–145)
Total Bilirubin: 0.8 mg/dL (ref 0.0–1.2)
Total Protein: 7.3 g/dL (ref 6.5–8.1)

## 2024-07-19 LAB — CBC
HCT: 37.4 % (ref 36.0–46.0)
Hemoglobin: 11.9 g/dL — ABNORMAL LOW (ref 12.0–15.0)
MCH: 30.4 pg (ref 26.0–34.0)
MCHC: 31.8 g/dL (ref 30.0–36.0)
MCV: 95.7 fL (ref 80.0–100.0)
Platelets: 255 K/uL (ref 150–400)
RBC: 3.91 MIL/uL (ref 3.87–5.11)
RDW: 13.1 % (ref 11.5–15.5)
WBC: 14.8 K/uL — ABNORMAL HIGH (ref 4.0–10.5)
nRBC: 0 % (ref 0.0–0.2)

## 2024-07-19 LAB — BASIC METABOLIC PANEL WITH GFR
Anion gap: 11 (ref 5–15)
BUN: 50 mg/dL — ABNORMAL HIGH (ref 8–23)
CO2: 24 mmol/L (ref 22–32)
Calcium: 8.2 mg/dL — ABNORMAL LOW (ref 8.9–10.3)
Chloride: 102 mmol/L (ref 98–111)
Creatinine, Ser: 1.61 mg/dL — ABNORMAL HIGH (ref 0.44–1.00)
GFR, Estimated: 31 mL/min — ABNORMAL LOW (ref >60)
Glucose, Bld: 121 mg/dL — ABNORMAL HIGH (ref 70–99)
Potassium: 5.5 mmol/L — ABNORMAL HIGH (ref 3.5–5.1)
Sodium: 137 mmol/L (ref 135–145)

## 2024-07-19 LAB — I-STAT CG4 LACTIC ACID, ED
Lactic Acid, Venous: 2.3 mmol/L (ref 0.5–1.9)
Lactic Acid, Venous: 1.2 mmol/L (ref 0.5–1.9)

## 2024-07-19 LAB — URINALYSIS, ROUTINE W REFLEX MICROSCOPIC
Bilirubin Urine: NEGATIVE
Glucose, UA: NEGATIVE mg/dL
Hgb urine dipstick: NEGATIVE
Ketones, ur: NEGATIVE mg/dL
Nitrite: NEGATIVE
Protein, ur: NEGATIVE mg/dL
Specific Gravity, Urine: 1.017 (ref 1.005–1.030)
pH: 5 (ref 5.0–8.0)

## 2024-07-19 LAB — TROPONIN I (HIGH SENSITIVITY)
Troponin I (High Sensitivity): 193 ng/L (ref <18)
Troponin I (High Sensitivity): 201 ng/L (ref <18)

## 2024-07-19 LAB — MAGNESIUM: Magnesium: 2.1 mg/dL (ref 1.7–2.4)

## 2024-07-19 LAB — LIPASE, BLOOD: Lipase: 39 U/L (ref 11–51)

## 2024-07-19 LAB — GLUCOSE, CAPILLARY: Glucose-Capillary: 114 mg/dL — ABNORMAL HIGH (ref 70–99)

## 2024-07-19 MED ORDER — SODIUM CHLORIDE 0.9 % IV SOLN
2.0000 g | Freq: Once | INTRAVENOUS | Status: AC
  Administered 2024-07-19: 2 g via INTRAVENOUS
  Filled 2024-07-19: qty 12.5

## 2024-07-19 MED ORDER — INSULIN ASPART 100 UNIT/ML IJ SOLN: 0.0000 [IU] | INTRAMUSCULAR | Status: DC

## 2024-07-19 MED ORDER — VANCOMYCIN HCL IN DEXTROSE 1-5 GM/200ML-% IV SOLN
1000.0000 mg | Freq: Once | INTRAVENOUS | Status: AC
  Administered 2024-07-19: 1000 mg via INTRAVENOUS
  Filled 2024-07-19: qty 200

## 2024-07-19 MED ORDER — ONDANSETRON HCL 4 MG/2ML IJ SOLN
4.0000 mg | Freq: Once | INTRAMUSCULAR | Status: AC
  Administered 2024-07-19: 4 mg via INTRAVENOUS
  Filled 2024-07-19: qty 2

## 2024-07-19 MED ORDER — CHLORHEXIDINE GLUCONATE CLOTH 2 % EX PADS
6.0000 | MEDICATED_PAD | Freq: Every day | CUTANEOUS | Status: DC
  Administered 2024-07-19 – 2024-07-23 (×5): 6 via TOPICAL

## 2024-07-19 MED ORDER — LACTATED RINGERS IV SOLN
INTRAVENOUS | Status: DC
  Administered 2024-07-20: 100 mL/h via INTRAVENOUS

## 2024-07-19 MED ORDER — SODIUM CHLORIDE 0.9 % IV BOLUS
1000.0000 mL | Freq: Once | INTRAVENOUS | Status: AC
  Administered 2024-07-19: 1000 mL via INTRAVENOUS

## 2024-07-19 MED ORDER — HEPARIN SODIUM (PORCINE) 5000 UNIT/ML IJ SOLN
5000.0000 [IU] | Freq: Three times a day (TID) | INTRAMUSCULAR | Status: DC
  Administered 2024-07-19 – 2024-07-21 (×5): 5000 [IU] via SUBCUTANEOUS
  Filled 2024-07-19 (×5): qty 1

## 2024-07-19 MED ORDER — LACTATED RINGERS IV BOLUS: 500.0000 mL | Freq: Once | INTRAVENOUS | Status: DC

## 2024-07-19 MED ORDER — SODIUM CHLORIDE 0.9 % IV SOLN
2.0000 g | INTRAVENOUS | Status: DC
  Administered 2024-07-20: 2 g via INTRAVENOUS
  Filled 2024-07-19: qty 12.5

## 2024-07-19 MED ORDER — CHLORHEXIDINE GLUCONATE CLOTH 2 % EX PADS
6.0000 | MEDICATED_PAD | Freq: Every day | CUTANEOUS | Status: DC
  Administered 2024-07-20: 6 via TOPICAL

## 2024-07-19 MED ORDER — VANCOMYCIN HCL 750 MG/150ML IV SOLN: 750.0000 mg | INTRAVENOUS | Status: DC

## 2024-07-19 MED ORDER — METRONIDAZOLE 500 MG/100ML IV SOLN
500.0000 mg | Freq: Two times a day (BID) | INTRAVENOUS | Status: DC
  Administered 2024-07-20: 500 mg via INTRAVENOUS
  Filled 2024-07-19: qty 100

## 2024-07-19 MED ORDER — SODIUM POLYSTYRENE SULFONATE 15 GM/60ML CO SUSP
30.0000 g | Freq: Once | Status: DC
  Filled 2024-07-19: qty 120

## 2024-07-19 MED ORDER — SODIUM CHLORIDE 0.9 % IV SOLN
250.0000 mL | INTRAVENOUS | Status: AC
  Administered 2024-07-19: 250 mL via INTRAVENOUS

## 2024-07-19 MED ORDER — ORAL CARE MOUTH RINSE: 15.0000 mL | OROMUCOSAL | Status: DC | PRN

## 2024-07-19 MED ORDER — NOREPINEPHRINE 4 MG/250ML-% IV SOLN
0.0000 ug/min | INTRAVENOUS | Status: DC
  Administered 2024-07-19: 2 ug/min via INTRAVENOUS
  Administered 2024-07-20: 5 ug/min via INTRAVENOUS
  Filled 2024-07-19 (×2): qty 250

## 2024-07-19 MED ORDER — PANTOPRAZOLE SODIUM 40 MG IV SOLR
40.0000 mg | INTRAVENOUS | Status: DC
  Administered 2024-07-19 – 2024-07-21 (×3): 40 mg via INTRAVENOUS
  Filled 2024-07-19 (×4): qty 10

## 2024-07-19 MED ORDER — METRONIDAZOLE 500 MG/100ML IV SOLN
500.0000 mg | Freq: Once | INTRAVENOUS | Status: AC
  Administered 2024-07-19: 500 mg via INTRAVENOUS
  Filled 2024-07-19: qty 100

## 2024-07-19 NOTE — ED Provider Notes (Signed)
 [place] EMERGENCY DEPARTMENT AT [facility] Provider Note   CSN: [id number] Arrival date & time: 07/19/24  1430     Patient presents with: Emesis   Andrea Brooks is a 88 y.o. female.   Pt is a 88 yo female with pmhx significant for HLD, DM, HTN, GERD, skin cancer, CHF, CAD, and pneumatosis cystoides for which she takes flagyl  and pyridostigmine  daily.  Pt has had problems with low magnesium  and low calcium .  She was most recently admitted from 8/23-24 for hypomagnesemia and hypocalcemia.  Omeprazole  decreased and pyridostigmine  decreased.  Today, pt developed heart burn around 0300.  She took several other meds for GERD without improvement in sx.  Then she started vomiting, and has not been able to stop.  She feels weak and dizzy.       Prior to Admission medications   Medication Sig Start Date End Date Taking? Authorizing Provider  acetaminophen  (TYLENOL ) 500 MG tablet Take 1 tablet (500 mg total) by mouth every 8 (eight) hours as needed for moderate pain. 08/09/23   Singh, Prashant K, MD  aspirin  EC 81 MG tablet Take 81 mg by mouth daily.    [provider]  carvedilol  (COREG ) 3.125 MG tablet Take 1 tablet (3.125 mg total) by mouth 2 (two) times daily with a meal. 07/14/24   Hilty, Vinie BROCKS, MD  furosemide  (LASIX ) 40 MG tablet Take 1 tablet (40 mg total) by mouth daily. 07/16/24 07/16/25  Wouk, Devaughn Sayres, MD  LORazepam  (ATIVAN ) 1 MG tablet TAKE 1/2 TO 1 TABLET AS    NEEDED FOR ANXIETY (MAXIMUMONCE DAILY) 03/24/24   Chandra Toribio POUR, MD  Magnesium  200 MG CHEW Chew 400 mg by mouth 2 (two) times daily. 07/16/24   Wouk, Devaughn Sayres, MD  metroNIDAZOLE  (FLAGYL ) 250 MG tablet Take 250 mg by mouth every morning.    [provider]  Multiple Vitamins-Minerals (MULTI FOR HER 50+) TABS Take 1 tablet by mouth daily.    [provider]  omeprazole  (PRILOSEC) 20 MG capsule TAKE 1 CAPSULE DAILY 30 MINUTES BEFORE BREAKFAST 09/21/22   Zehr, Jessica D, PA-C   polyethylene glycol powder (GLYCOLAX /MIRALAX ) 17 GM/SCOOP powder Take 8.5 g by mouth as needed for moderate constipation.    [provider]  pyridostigmine  (MESTINON ) 60 MG tablet Take 30 mg by mouth daily. 02/03/22   [provider]  simvastatin  (ZOCOR ) 20 MG tablet Take 1 tablet (20 mg total) by mouth at bedtime. 01/19/24   [name], MD  valsartan  (DIOVAN ) 320 MG tablet Take 1 tablet (320 mg total) by mouth daily. 01/19/24   Hilty, Vinie BROCKS, MD    Allergies: Tape, Magnesium -containing compounds, and Flagyl  [metronidazole ]    Review of Systems  Gastrointestinal:  Positive for vomiting.  Neurological:  Positive for weakness.  All other systems reviewed and are negative.   Updated Vital Signs BP (!) 85/48   Pulse 71   Temp (!) 96.8 F (36 C) (Axillary)   Resp 18   SpO2 97%   Physical Exam Vitals and nursing note reviewed.  Constitutional:      Appearance: Normal appearance. She is ill-appearing.  HENT:     Head: Normocephalic and atraumatic.     Right Ear: External ear normal.     Left Ear: External ear normal.     Nose: Nose normal.     Mouth/Throat:     Mouth: Mucous membranes are dry.  Eyes:     Extraocular Movements: Extraocular movements intact.  Conjunctiva/sclera: Conjunctivae normal.     Pupils: Pupils are equal, round, and reactive to light.  Cardiovascular:     Rate and Rhythm: Normal rate and regular rhythm.     Pulses: Normal pulses.     Heart sounds: Normal heart sounds.  Pulmonary:     Effort: Pulmonary effort is normal.  Abdominal:     General: Abdomen is flat. Bowel sounds are normal.     Palpations: Abdomen is soft.  Musculoskeletal:        General: Normal range of motion.     Cervical back: Normal range of motion and neck supple.  Skin:    General: Skin is warm.     Capillary Refill: Capillary refill takes less than 2 seconds.  Neurological:     General: No focal deficit present.     Mental Status: She is alert and  oriented to person, place, and time.  Psychiatric:        Mood and Affect: Mood normal.        Behavior: Behavior normal.     (all labs ordered are listed, but only abnormal results are displayed) Labs Reviewed  COMPREHENSIVE METABOLIC PANEL WITH GFR - Abnormal; Notable for the following components:      Result Value   Potassium 5.8 (*)    Chloride 97 (*)    Glucose, Bld 171 (*)    BUN 52 (*)    Creatinine, Ser 1.83 (*)    Albumin 3.3 (*)    GFR, Estimated 27 (*)    All other components within normal limits  CBC - Abnormal; Notable for the following components:   WBC 14.8 (*)    Hemoglobin 11.9 (*)    All other components within normal limits  I-STAT CG4 LACTIC ACID, ED - Abnormal; Notable for the following components:   Lactic Acid, Venous 2.3 (*)    All other components within normal limits  LIPASE, BLOOD  URINALYSIS, ROUTINE W REFLEX MICROSCOPIC  MAGNESIUM   I-STAT CG4 LACTIC ACID, ED  SAMPLE TO BLOOD BANK    EKG: EKG Interpretation Date/Time:  Wednesday July 19 2024 14:53:15 EDT Ventricular Rate:  70 PR Interval:  285 QRS Duration:  136 QT Interval:  435 QTC Calculation: 470 R Axis:   -61  Text Interpretation: Sinus rhythm Prolonged PR interval Right bundle branch block Left ventricular hypertrophy Anterolateral infarct, age indeterminate ST elevation, consider inferior injury No significant change since last tracing Confirmed by Dean Clarity [phone]) on 07/19/2024 3:18:01 PM  Radiology: DG Chest Portable 1 View Result Date: 07/19/2024 CLINICAL DATA:  Nausea and vomiting. EXAM: PORTABLE CHEST 1 VIEW COMPARISON:  01/05/2024. FINDINGS: The heart size and mediastinal contours are unchanged. Aortic atherosclerosis. Low lung volumes with chronic interstitial changes. No appreciable focal consolidation, pleural effusion, or pneumothorax. Diffuse osseous demineralization. No acute osseous abnormality. IMPRESSION: Low lung volumes with chronic interstitial changes. No  acute cardiopulmonary findings. Electronically Signed   By: Harrietta Sherry M.D.   On: 07/19/2024 16:04     Procedures   Medications Ordered in the ED  sodium chloride  0.9 % bolus 1,000 mL (0 mLs Intravenous Stopped 07/19/24 1607)  ondansetron  (ZOFRAN ) injection 4 mg (4 mg Intravenous Given 07/19/24 1511)  sodium chloride  0.9 % bolus 1,000 mL (1,000 mLs Intravenous New Bag/Given 07/19/24 1607)                                    Medical  Decision Making Amount and/or Complexity of Data Reviewed Labs: ordered. Radiology: ordered.  Risk Prescription drug management.   This patient presents to the ED for concern of n/v, this involves an extensive number of treatment options, and is a complaint that carries with it a high risk of complications and morbidity.  The differential diagnosis includes aki, low mg, low k, low ca, dehydration   Co morbidities that complicate the patient evaluation  HLD, DM, HTN, GERD, skin cancer, CHF, CAD, and pneumatosis cystoides   Additional history obtained:  Additional history obtained from epic chart review External records from outside source obtained and reviewed including family   Lab Tests:  I Ordered, and personally interpreted labs.  The pertinent results include:  cbc with wbc elevated at 14.8, hgb 11.9   Imaging Studies ordered:  I ordered imaging studies including cxr  I independently visualized and interpreted imaging which showed  Low lung volumes with chronic interstitial changes. No acute  cardiopulmonary findings.   I agree with the radiologist interpretation   Cardiac Monitoring:  The patient was maintained on a cardiac monitor.  I personally viewed and interpreted the cardiac monitored which showed an underlying rhythm of: nsr   Medicines ordered and prescription drug management:  I ordered medication including ivfs  for sx  Reevaluation of the patient after these medicines showed that the patient improved I have  reviewed the patients home medicines and have made adjustments as needed   Test Considered:  ct   Problem List / ED Course:  N/v with hypotension:  ivfs given   Reevaluation:  After the interventions noted above, I reevaluated the patient and found that they have :improved   Social Determinants of Health:  Lives at home   Dispostion:  After consideration of the diagnostic results and the patients response to treatment, I feel that the patent would benefit from admission.       Final diagnoses:  Dehydration  Nausea and vomiting, unspecified vomiting type  AKI (acute kidney injury) [facility])    ED Discharge Orders     None          Dean Clarity, MD 07/19/24 1723

## 2024-07-19 NOTE — Progress Notes (Signed)
 Provider DOROTHA Na at bedside. Per DO, hold NG tube placement at this time. Also hold Kayexalate  if k+ is less than 6.0

## 2024-07-19 NOTE — H&P (Signed)
 NAMEJakaylee Brooks, MRN:  [id number], DOB:  Mar 13, 1936, LOS: 0 ADMISSION DATE:  07/19/2024, CONSULTATION DATE:  07/19/24 REFERRING MD:  EDP, CHIEF COMPLAINT:  n/v abdominal pain   History of Present Illness:  88 yo female presented with 3 days of n/v and abdominal pain. Pt and husband state that she was recently admitted (discharged 8/24) for hypomag and hypocalcemia. She was started on decreased dosing of omeprazole  and pyridostigmine  however today started with progressively worsening heartburn/indigestion around 0300. She states that she took multiple home remedies without any benefit. She continued to have worsening symptoms resulting in emesis that has been unrelenting. At this time her additional complaints are of generalized weakness and dizziness with movement. She continues to endorse nausea as well. No fever/chills/diarrhea. Denies anginal chest pain, sob, ha, cough or known recent sick contacts.   Of note pt never had NG placed as requested of the EDP when presented with partial SBO, she is improving from nausea standpoint and has had no further vomiting. Pt states that she did have BM earlier today but it was minimal compared to usual. She denies any medication changes, has been taking them as prescribed daily however with frequent emesis, she is unsure what she has been able to keep down.     Pertinent  Medical History  Hyperlipidemia Dm2 with hyperglycemia Htn Thalia H/o skin cancer HFpEF, lvef 2024 60-65% but grade 2 diastolic dysfunction CAD  Pneumatosis cystoides intestinalis, on chronic therapy (flagyl  and pyridostigmine )   Significant Hospital Events: Including procedures, antibiotic start and stop dates in addition to other pertinent events   Admitted to ICU on levophed  8/27  Interim History / Subjective:    Objective    Blood pressure 114/62, pulse 60, temperature (!) 96.8 F (36 C), temperature source Axillary, resp. rate 20, SpO2 97%.        Intake/Output  Summary (Last 24 hours) at 07/19/2024 2152 Last data filed at 07/19/2024 1954 Gross per 24 hour  Intake 2391.25 ml  Output --  Net 2391.25 ml   There were no vitals filed for this visit.  Examination: General: aaox3, sitting up alert and in nad in bed, appears comfortable HENT: ncat, eomi, perrla, mmmp Lungs: ctab Cardiovascular: rrr no m/g/r Abdomen: soft, non distended, mildly ttp but no rebound or guarding  Extremities: no c/c/e Neuro: no focal deficits GU: deferred.   Resolved problem list   Assessment and Plan  Acute shock suspected hypovolemic +/- sepsis Sepsis 2/2 gi disorder vs partial sbo Baseline Pneumatosis cystoides intestinalis, on chronic medication H/o htn, currently hypotensive Hyperkalemia Aki on ckd 3a Elevated troponin Hyperlipidemia Gerd HFpEF -titrate vasopressor to map >65 -empiric abx for gi coverage including gn and anaerobes -ng tube appreciated with ilws... however never placed in ED and at this time pt symptoms improving will hold on placement, keep NPO with exception of sips/chips and repeat Kub in am.  -send GI panel -ppi -monitor elytes -hold anti htn agents from home -hold statin, resume as able -repeat echo with recent one being 2024 - monitor I/o, renal u/s if not improved in am as well as urine studies -continue ivf resuscitation considering volume losses -trend potassium -EKG without frank changes. Will trend trop but less likely etiology considering presentation and without acute changes. -hold on heparin  infusion at this time. If continues to rise or have EKG changes can begin.  -thankfully lactate normal.     Best Practice (right click and Reselect all SmartList Selections daily)   Diet/type: NPO  DVT prophylaxis prophylactic heparin   Pressure ulcer(s): N/A GI prophylaxis: PPI Lines: N/A Foley:  Yes, and it is still needed Code Status:  full code Last date of multidisciplinary goals of care discussion [--]  Labs    CBC: Recent Labs  Lab 07/15/24 0520 07/19/24 1445  WBC 6.3 14.8*  NEUTROABS 4.2  --   HGB 10.9* 11.9*  HCT 33.9* 37.4  MCV 93.1 95.7  PLT 207 255    Basic Metabolic Panel: Recent Labs  Lab 07/14/24 1315 07/15/24 0520 07/15/24 2017 07/16/24 0221 07/19/24 1445  NA 146* 141  --  140 135  K 3.7 3.4*  --  4.3 5.8*  CL 106 110  --  103 97*  CO2 24 25  --  28 26  GLUCOSE 99 138*  --  92 171*  BUN 26 25*  --  18 52*  CREATININE 1.08* 1.10*  --  1.01* 1.83*  CALCIUM  6.5* 6.3*  --  7.6* 9.0  MG 0.6* 0.6* 2.9* 2.6*  --    GFR: Estimated Creatinine Clearance: 19.4 mL/min (A) (by C-G formula based on SCr of 1.83 mg/dL (H)). Recent Labs  Lab 07/15/24 0520 07/19/24 1445 07/19/24 1548 07/19/24 1758  WBC 6.3 14.8*  --   --   LATICACIDVEN  --   --  2.3* 1.2    Liver Function Tests: Recent Labs  Lab 07/15/24 0520 07/19/24 1445  AST 21 23  ALT 12 12  ALKPHOS 64 58  BILITOT 0.4 0.8  PROT 6.7 7.3  ALBUMIN 2.8* 3.3*   Recent Labs  Lab 07/19/24 1445  LIPASE 39   No results for input(s): AMMONIA in the last 168 hours.  ABG No results found for: PHART, PCO2ART, PO2ART, HCO3, TCO2, ACIDBASEDEF, O2SAT   Coagulation Profile: No results for input(s): INR, PROTIME in the last 168 hours.  Cardiac Enzymes: No results for input(s): CKTOTAL, CKMB, CKMBINDEX, TROPONINI in the last 168 hours.  HbA1C: Hemoglobin A1C  Date/Time Value Ref Range Status  01/18/2024 01:56 PM 5.3 4.0 - 5.6 % Final  01/13/2017 12:00 AM 8.1  Final  07/31/2016 12:00 AM 7.4  Final   HbA1c, POC (prediabetic range)  Date/Time Value Ref Range Status  10/03/2018 09:52 AM 5.8 5.7 - 6.4 % Final   HbA1c POC (<> result, manual entry)  Date/Time Value Ref Range Status  03/17/2023 11:05 AM 5.1 4.0 - 5.6 % Final   Hgb A1c MFr Bld  Date/Time Value Ref Range Status  08/04/2023 03:15 PM 5.6 4.8 - 5.6 % Final    Comment:    (NOTE)         Prediabetes: 5.7 - 6.4          Diabetes: >6.4         Glycemic control for adults with diabetes: <7.0   09/15/2022 09:02 AM 5.4 4.8 - 5.6 % Final    Comment:             Prediabetes: 5.7 - 6.4          Diabetes: >6.4          Glycemic control for adults with diabetes: <7.0     CBG: No results for input(s): GLUCAP in the last 168 hours.  Review of Systems:   As per HPI  Past Medical History:  She,  has a past medical history of Anxiety, CHF (congestive heart failure) (HCC), Colon polyps, Diabetes mellitus without complication (HCC), Diverticulosis, Food poisoning, Gallstones, GERD (gastroesophageal reflux disease), Hyperlipidemia, Hypertension, Non-ST elevation (  NSTEMI) myocardial infarction (HCC), Obesity, and SCC (squamous cell carcinoma) in situ x 2 (06/17/2018).   Surgical History:   Past Surgical History:  Procedure Laterality Date   ABDOMINAL HYSTERECTOMY     total   BREAST BIOPSY Left    neg   CHOLECYSTECTOMY     JOINT REPLACEMENT Bilateral    knee   LAPAROTOMY N/A 03/11/2021   Procedure: EXPLORATORY LAPAROTOMY;  Surgeon: Signe Mitzie LABOR, MD;  Location: [facility] OR;  Service: General;  Laterality: N/A;   LEFT HEART CATH AND CORONARY ANGIOGRAPHY N/A 07/04/2018   Procedure: LEFT HEART CATH AND CORONARY ANGIOGRAPHY;  Surgeon: Dann Candyce RAMAN, MD;  Location: [facility] INVASIVE CV LAB;  Service: Cardiovascular;  Laterality: N/A;   REPLACEMENT TOTAL KNEE BILATERAL       Social History:   reports that she quit smoking about 64 years ago. Her smoking use included cigarettes. She started smoking about 66 years ago. She has a 2 pack-year smoking history. She has been exposed to tobacco smoke. She has never used smokeless tobacco. She reports that she does not drink alcohol and does not use drugs.   Family History:  Her family history includes Bone cancer in her sister; Breast cancer in her maternal aunt and paternal aunt; Colon cancer in her mother; Yvone' disease in her daughter and sister; Heart failure in her  father; Hypertension in her mother; Stroke in her brother, father, and sister. There is no history of Esophageal cancer, Rectal cancer, or Liver cancer.   Allergies Allergies  Allergen Reactions   Tape Other (See Comments)    Skin tears easily.  OK with paper tape.   Magnesium -Containing Compounds Diarrhea and Nausea And Vomiting    Can take Vitamins w/ magnesium  Does not tolerate po magnesium  by itself   Flagyl  [Metronidazole ] Other (See Comments)    Neuropathy with 250mg  BID dosing.  OK to take 250mg  QD     Home Medications  Prior to Admission medications   Medication Sig Start Date End Date Taking? Authorizing Provider  acetaminophen  (TYLENOL ) 500 MG tablet Take 1 tablet (500 mg total) by mouth every 8 (eight) hours as needed for moderate pain. 08/09/23  Yes Dennise Lavada POUR, MD  aspirin  EC 81 MG tablet Take 81 mg by mouth daily.   Yes [provider]  carvedilol  (COREG ) 3.125 MG tablet Take 1 tablet (3.125 mg total) by mouth 2 (two) times daily with a meal. 07/14/24  Yes Hilty, Vinie BROCKS, MD  furosemide  (LASIX ) 40 MG tablet Take 1 tablet (40 mg total) by mouth daily. 07/16/24 07/16/25 Yes Wouk, Devaughn Sayres, MD  LORazepam  (ATIVAN ) 1 MG tablet TAKE 1/2 TO 1 TABLET AS    NEEDED FOR ANXIETY (MAXIMUMONCE DAILY) 03/24/24  Yes Chandra Toribio POUR, MD  Magnesium  200 MG CHEW Chew 400 mg by mouth 2 (two) times daily. 07/16/24  Yes Wouk, Devaughn Sayres, MD  metroNIDAZOLE  (FLAGYL ) 250 MG tablet Take 250 mg by mouth every morning.   Yes [provider]  Multiple Vitamins-Minerals (MULTI FOR HER 50+) TABS Take 1 tablet by mouth daily.   Yes [provider]  omeprazole  (PRILOSEC) 20 MG capsule TAKE 1 CAPSULE DAILY 30 MINUTES BEFORE BREAKFAST 09/21/22  Yes Zehr, Rhyan Wolters D, PA-C  polyethylene glycol powder (GLYCOLAX /MIRALAX ) 17 GM/SCOOP powder Take 8.5 g by mouth as needed for moderate constipation.   Yes [provider]  pyridostigmine  (MESTINON ) 60 MG tablet Take 30 mg by  mouth daily. 02/03/22  Yes [provider]  simvastatin  (ZOCOR )  20 MG tablet Take 1 tablet (20 mg total) by mouth at bedtime. 01/19/24  Yes Hilty, Vinie BROCKS, MD  valsartan  (DIOVAN ) 320 MG tablet Take 1 tablet (320 mg total) by mouth daily. 01/19/24  Yes Hilty, Vinie BROCKS, MD     Critical care time: 

## 2024-07-19 NOTE — ED Triage Notes (Signed)
 Pt recently seen for low magnesium . Pt endorses n/v since 3 am today with mild abd pain. Husband reports he has not gotten prescription since discharge.

## 2024-07-19 NOTE — ED Notes (Signed)
 Called daughter with room assignment

## 2024-07-19 NOTE — ED Notes (Signed)
 CCMD called.

## 2024-07-19 NOTE — Sepsis Progress Note (Signed)
 eLink is following this Code Sepsis.

## 2024-07-19 NOTE — Progress Notes (Signed)
 Pharmacy Antibiotic Note  Andrea Brooks is a 88 y.o. female admitted on 07/19/2024 with sepsis.  Pharmacy has been consulted for vancomycin  and cefepime  dosing. Vancomycin  1g IV x 1 given in ED  Plan: Vancomycin  750mg  IV q 36h (eAUC 547) Cefepime  2g IV q 24h Monitor renal function, Cx and clinical progression to narrow Vancomycin  levels as indicated     Temp (24hrs), Avg:96.8 F (36 C), Min:96.8 F (36 C), Max:96.8 F (36 C)  Recent Labs  Lab 07/14/24 1315 07/15/24 0520 07/16/24 0221 07/19/24 1445 07/19/24 1548 07/19/24 1758  WBC  --  6.3  --  14.8*  --   --   CREATININE 1.08* 1.10* 1.01* 1.83*  --   --   LATICACIDVEN  --   --   --   --  2.3* 1.2    Estimated Creatinine Clearance: 19.4 mL/min (A) (by C-G formula based on SCr of 1.83 mg/dL (H)).    Allergies  Allergen Reactions   Tape Other (See Comments)    Skin tears easily.  OK with paper tape.   Magnesium -Containing Compounds Diarrhea and Nausea And Vomiting    Can take Vitamins w/ magnesium  Does not tolerate po magnesium  by itself   Flagyl  [Metronidazole ] Other (See Comments)    Neuropathy with 250mg  BID dosing.  OK to take 250mg  QD    Dorn Poot, PharmD, [facility] Clinical Pharmacist ED Pharmacist Phone # [phone] 07/19/2024 9:41 PM

## 2024-07-19 NOTE — ED Provider Notes (Signed)
 Physical Exam  BP (!) 85/48   Pulse 71   Temp (!) 96.8 F (36 C) (Axillary)   Resp 18   SpO2 97%   Physical Exam  Procedures  .Critical Care  Performed by: Jerrol Agent, MD Authorized by: Jerrol Agent, MD   Critical care provider statement:    Critical care time (minutes):  80   Critical care was necessary to treat or prevent imminent or life-threatening deterioration of the following conditions:  Shock   Critical care was time spent personally by me on the following activities:  Development of treatment plan with patient or surrogate, discussions with consultants, evaluation of patient's response to treatment, examination of patient, ordering and review of laboratory studies, ordering and review of radiographic studies, ordering and performing treatments and interventions, pulse oximetry, re-evaluation of patient's condition and review of old charts   Care discussed with: admitting provider     ED Course / MDM   Clinical Course as of 07/19/24 2044  Wed Jul 19, 2024  [address]): SMALL [JL]  2028 WBC, UA: 0-5 [JL]  2028 Bacteria, UA(!): RARE [JL]    Clinical Course User Index [JL] Jerrol Agent, MD   Medical Decision Making Amount and/or Complexity of Data Reviewed Labs: ordered. Decision-making details documented in ED Course. Radiology: ordered.  Risk Prescription drug management. Decision regarding hospitalization.   34F presenting with recent DC from hospital for low K and Mg. Vomiting, BP in the 70s. Getting fluids 2L total. Lactic 2.3. Needs reassessment.  My evaluation the patient's blood pressure checked and she was in the 70s over 50s, status post 2 L IV fluids.  She feels fatigued, likely due to hypotension.  She does have a leukocytosis but denies any pain at this time.  Urinalysis is pending.  Will cover with antibiotics due to concern for possible sepsis.  Considered bacteremia.  Blood cultures ordered and broad-spectrum antibiotics ordered.   Levophed  ordered in addition to 500 cc LR bolus.  Will obtain CT chest abdomen pelvis to further evaluate.  Additional differential would be hypovolemia.  The patient did take her blood pressure medication this morning to include Coreg .  Heart rate is in the 70s. Temp 96.4F.   Labs: CBC with a leukocytosis to 14.8, hemoglobin mildly anemic to 11.9, CMP with hyperkalemia to 5.8, blood glucose 171, BUN 52, creatinine with an AKI at 1.83, magnesium  pending, lipase normal, lactic acid normal at 1.2, urinalysis unconvincing for UTI, blood cultures pending.  CT C/A/P:  IMPRESSION:  CT of the chest: Chronic fibrotic changes without acute abnormality.    Aortic Atherosclerosis (ICD10-I70.0) and Emphysema (ICD10-J43.9).    CT of the abdomen and pelvis: Diffuse small bowel dilatation  identified from the mid jejunum to the iliac level of the terminal  ileum consistent with at least partial small bowel obstruction. No  mass lesion is seen although the transition point lies at the distal  most ileum. These changes may be related to underlying adhesions. No  other focal abnormality is noted.    NG tube inserted, family updated regarding plan of care.  Patient stabilized on Levophed  but did require up to 7 mcg/min of Levophed  to maintain blood pressure.  No evidence of bowel perforation and without an elevated lactic acid and no evidence of ischemia, no clear evidence of surgical emergency.  Will place NG tube to manage partial bowel obstruction, continually rehydrate the patient.  Will admit to pulmonary critical care for observation given her shock, Dr. Layman accepting.  Jerrol Agent, MD 07/19/24 2121

## 2024-07-19 NOTE — Progress Notes (Signed)
 eLink Physician-Brief Progress Note Patient Name: Andrea Brooks DOB: [birth year] MRN: [id number]   Date of Service  07/19/2024  HPI/Events of Note  Patient admitted with GI tract symptoms, hypotension, and acute kidney injury. Lactate is normal. Work up is in progress.  eICU Interventions  New Patient Evaluation.        Nitesh Pitstick U Haliegh Khurana 07/19/2024, 10:53 PM

## 2024-07-20 ENCOUNTER — Inpatient Hospital Stay (HOSPITAL_COMMUNITY)

## 2024-07-20 DIAGNOSIS — R578 Other shock: Secondary | ICD-10-CM

## 2024-07-20 DIAGNOSIS — K56600 Partial intestinal obstruction, unspecified as to cause: Secondary | ICD-10-CM

## 2024-07-20 DIAGNOSIS — R579 Shock, unspecified: Secondary | ICD-10-CM | POA: Diagnosis not present

## 2024-07-20 DIAGNOSIS — I5032 Chronic diastolic (congestive) heart failure: Secondary | ICD-10-CM

## 2024-07-20 DIAGNOSIS — R7989 Other specified abnormal findings of blood chemistry: Secondary | ICD-10-CM | POA: Diagnosis not present

## 2024-07-20 LAB — ECHOCARDIOGRAM COMPLETE
AR max vel: 2.57 cm2
AV Area VTI: 2.32 cm2
AV Area mean vel: 2.72 cm2
AV Mean grad: 5 mmHg
AV Peak grad: 8.3 mmHg
Ao pk vel: 1.44 m/s
Area-P 1/2: 2.28 cm2
S' Lateral: 2.8 cm
Weight: 2095.25 [oz_av]

## 2024-07-20 LAB — GLUCOSE, CAPILLARY
Glucose-Capillary: 105 mg/dL — ABNORMAL HIGH (ref 70–99)
Glucose-Capillary: 108 mg/dL — ABNORMAL HIGH (ref 70–99)
Glucose-Capillary: 72 mg/dL (ref 70–99)
Glucose-Capillary: 76 mg/dL (ref 70–99)
Glucose-Capillary: 84 mg/dL (ref 70–99)
Glucose-Capillary: 93 mg/dL (ref 70–99)
Glucose-Capillary: 96 mg/dL (ref 70–99)

## 2024-07-20 LAB — MRSA NEXT GEN BY PCR, NASAL: MRSA by PCR Next Gen: NOT DETECTED

## 2024-07-20 LAB — BASIC METABOLIC PANEL WITH GFR
Anion gap: 9 (ref 5–15)
Anion gap: 10 (ref 5–15)
BUN: 50 mg/dL — ABNORMAL HIGH (ref 8–23)
BUN: 29 mg/dL — ABNORMAL HIGH (ref 8–23)
CO2: 24 mmol/L (ref 22–32)
CO2: 23 mmol/L (ref 22–32)
Calcium: 8.1 mg/dL — ABNORMAL LOW (ref 8.9–10.3)
Calcium: 8.2 mg/dL — ABNORMAL LOW (ref 8.9–10.3)
Chloride: 104 mmol/L (ref 98–111)
Chloride: 105 mmol/L (ref 98–111)
Creatinine, Ser: 1.47 mg/dL — ABNORMAL HIGH (ref 0.44–1.00)
Creatinine, Ser: 0.99 mg/dL (ref 0.44–1.00)
GFR, Estimated: 35 mL/min — ABNORMAL LOW (ref >60)
GFR, Estimated: 56 mL/min — ABNORMAL LOW (ref >60)
Glucose, Bld: 111 mg/dL — ABNORMAL HIGH (ref 70–99)
Glucose, Bld: 99 mg/dL (ref 70–99)
Potassium: 5.2 mmol/L — ABNORMAL HIGH (ref 3.5–5.1)
Potassium: 4.5 mmol/L (ref 3.5–5.1)
Sodium: 137 mmol/L (ref 135–145)
Sodium: 138 mmol/L (ref 135–145)

## 2024-07-20 LAB — CBC
HCT: 31 % — ABNORMAL LOW (ref 36.0–46.0)
Hemoglobin: 10.1 g/dL — ABNORMAL LOW (ref 12.0–15.0)
MCH: 30 pg (ref 26.0–34.0)
MCHC: 32.6 g/dL (ref 30.0–36.0)
MCV: 92 fL (ref 80.0–100.0)
Platelets: 209 K/uL (ref 150–400)
RBC: 3.37 MIL/uL — ABNORMAL LOW (ref 3.87–5.11)
RDW: 13.2 % (ref 11.5–15.5)
WBC: 14.6 K/uL — ABNORMAL HIGH (ref 4.0–10.5)
nRBC: 0 % (ref 0.0–0.2)

## 2024-07-20 LAB — COMPREHENSIVE METABOLIC PANEL WITH GFR
ALT: 11 U/L (ref 0–44)
AST: 19 U/L (ref 15–41)
Albumin: 2.5 g/dL — ABNORMAL LOW (ref 3.5–5.0)
Alkaline Phosphatase: 46 U/L (ref 38–126)
Anion gap: 11 (ref 5–15)
BUN: 40 mg/dL — ABNORMAL HIGH (ref 8–23)
CO2: 22 mmol/L (ref 22–32)
Calcium: 8.1 mg/dL — ABNORMAL LOW (ref 8.9–10.3)
Chloride: 106 mmol/L (ref 98–111)
Creatinine, Ser: 1.21 mg/dL — ABNORMAL HIGH (ref 0.44–1.00)
GFR, Estimated: 44 mL/min — ABNORMAL LOW (ref >60)
Glucose, Bld: 106 mg/dL — ABNORMAL HIGH (ref 70–99)
Potassium: 4.9 mmol/L (ref 3.5–5.1)
Sodium: 139 mmol/L (ref 135–145)
Total Bilirubin: 0.5 mg/dL (ref 0.0–1.2)
Total Protein: 5.7 g/dL — ABNORMAL LOW (ref 6.5–8.1)

## 2024-07-20 LAB — SAMPLE TO BLOOD BANK

## 2024-07-20 LAB — LACTIC ACID, PLASMA: Lactic Acid, Venous: 1.6 mmol/L (ref 0.5–1.9)

## 2024-07-20 LAB — TROPONIN I (HIGH SENSITIVITY)
Troponin I (High Sensitivity): 216 ng/L (ref <18)
Troponin I (High Sensitivity): 202 ng/L (ref <18)
Troponin I (High Sensitivity): 236 ng/L (ref <18)

## 2024-07-20 LAB — MAGNESIUM
Magnesium: 2 mg/dL (ref 1.7–2.4)
Magnesium: 1.8 mg/dL (ref 1.7–2.4)

## 2024-07-20 LAB — PHOSPHORUS: Phosphorus: 4.2 mg/dL (ref 2.5–4.6)

## 2024-07-20 LAB — HEMOGLOBIN A1C
Hgb A1c MFr Bld: 5.6 % (ref 4.8–5.6)
Mean Plasma Glucose: 114 mg/dL

## 2024-07-20 MED ORDER — LACTATED RINGERS IV BOLUS
250.0000 mL | Freq: Once | INTRAVENOUS | Status: AC
  Administered 2024-07-20: 250 mL via INTRAVENOUS

## 2024-07-20 MED ORDER — METRONIDAZOLE 500 MG PO TABS
250.0000 mg | ORAL_TABLET | Freq: Every morning | ORAL | Status: DC
Start: 2024-07-20 — End: 2024-07-24
  Administered 2024-07-20 – 2024-07-24 (×5): 250 mg via ORAL
  Filled 2024-07-20 (×5): qty 1

## 2024-07-20 MED ORDER — CALCIUM CARBONATE ANTACID 500 MG PO CHEW
400.0000 mg | CHEWABLE_TABLET | Freq: Every day | ORAL | Status: DC | PRN
  Administered 2024-07-20: 200 mg via ORAL
  Administered 2024-07-24: 400 mg via ORAL
  Filled 2024-07-20: qty 2

## 2024-07-20 MED ORDER — LOPERAMIDE HCL 2 MG PO CAPS
2.0000 mg | ORAL_CAPSULE | ORAL | Status: DC | PRN
  Administered 2024-07-20: 2 mg via ORAL
  Filled 2024-07-20: qty 1

## 2024-07-20 MED ORDER — ONDANSETRON HCL 4 MG/2ML IJ SOLN
4.0000 mg | Freq: Four times a day (QID) | INTRAMUSCULAR | Status: DC | PRN
  Administered 2024-07-20: 4 mg via INTRAVENOUS
  Filled 2024-07-20: qty 2

## 2024-07-20 MED ORDER — INSULIN ASPART 100 UNIT/ML IJ SOLN: 0.0000 [IU] | INTRAMUSCULAR | Status: DC

## 2024-07-20 MED ORDER — LACTATED RINGERS IV BOLUS
1000.0000 mL | Freq: Once | INTRAVENOUS | Status: AC
  Administered 2024-07-20: 1000 mL via INTRAVENOUS

## 2024-07-20 MED ORDER — LACTATED RINGERS IV BOLUS: 500.0000 mL | Freq: Once | INTRAVENOUS | Status: DC

## 2024-07-20 MED ORDER — CALCIUM CARBONATE ANTACID 500 MG PO CHEW
1.0000 | CHEWABLE_TABLET | Freq: Every day | ORAL | Status: DC | PRN
  Administered 2024-07-20: 200 mg via ORAL
  Filled 2024-07-20 (×2): qty 1

## 2024-07-20 MED ORDER — PYRIDOSTIGMINE BROMIDE 60 MG PO TABS
30.0000 mg | ORAL_TABLET | Freq: Every day | ORAL | Status: DC
  Administered 2024-07-20: 30 mg via ORAL
  Filled 2024-07-20: qty 0.5

## 2024-07-20 NOTE — Progress Notes (Signed)
 NAMEMaliyah Brooks, MRN:  [id number], DOB:  September 12, 1936, LOS: 1 ADMISSION DATE:  07/19/2024, CONSULTATION DATE:  07/19/24 REFERRING MD:  EDP, CHIEF COMPLAINT:  n/v abdominal pain   History of Present Illness:  88 yo female presented with 3 days of n/v and abdominal pain. Pt and husband state that she was recently admitted (discharged 8/24) for hypomag and hypocalcemia. She was started on decreased dosing of omeprazole  and pyridostigmine  however today started with progressively worsening heartburn/indigestion around 0300. She states that she took multiple home remedies without any benefit. She continued to have worsening symptoms resulting in emesis that has been unrelenting. At this time her additional complaints are of generalized weakness and dizziness with movement. She continues to endorse nausea as well. No fever/chills/diarrhea. Denies anginal chest pain, sob, ha, cough or known recent sick contacts.   Of note pt never had NG placed as requested of the EDP when presented with partial SBO, she is improving from nausea standpoint and has had no further vomiting. Pt states that she did have BM earlier today but it was minimal compared to usual. She denies any medication changes, has been taking them as prescribed daily however with frequent emesis, she is unsure what she has been able to keep down.   Pertinent  Medical History  Hyperlipidemia Dm2 with hyperglycemia Htn Thalia H/o skin cancer HFpEF, lvef 2024 60-65% but grade 2 diastolic dysfunction CAD  Pneumatosis cystoides intestinalis, on chronic therapy (flagyl  and pyridostigmine )   Significant Hospital Events: Including procedures, antibiotic start and stop dates in addition to other pertinent events   8/27: Admitted to ICU on levophed   8/28: No further nausea/vomiting, weaning vasopressors   Interim History / Subjective:  No further nausea/vomiting since ED. Patient tells me she is feeling hungry this morning.   Objective     Blood pressure (!) 107/44, pulse (!) 53, temperature 98 F (36.7 C), temperature source Oral, resp. rate 17, weight 59.4 kg, SpO2 100%.        Intake/Output Summary (Last 24 hours) at 07/20/2024 0913 Last data filed at 07/20/2024 0900 Gross per 24 hour  Intake 4350.93 ml  Output 1025 ml  Net 3325.93 ml   Filed Weights   07/20/24 0500  Weight: 59.4 kg    Examination: General: elderly female, resting in bed comfortably, no acute distress HENT: normocephalic, atraumatic, moist mucous membranes Lungs: clear to auscultation bilaterally Cardiovascular: regular rate and rhythm, no murmurs, rubs or gallops Abdomen: soft, non-distended, minimally tender throughout to palpation but no guarding or rebound Extremities: warm, palpable pedal pulses, no edema Neuro: alert and oriented x 4, no focal deficits  GU: deferred  Resolved problem list   Assessment and Plan   Acute shock Most likely hypovolemic but cannot rule out sepsis. Most likely source would be GI in the setting of partial small bowel obstruction and history of pneumatosis cystoides intestinalis.  - Continue levophed  to maintain MAP>65 - IVF resuscitation as indicated by hemodynamic markers of perfusion - Continue cefepime  and flagyl  - Follow up blood cultures and can likely de-escalate to home Flagyl  only if no growth at 48 hours  Partial small bowel obstruction No further vomiting, had bowel movement yesterday morning. Endorses constipation for the past several days.  - Defer NGT unless clinically worsening - Clear liquid diet today - Continue PPI IV daily  - Follow-up KUB read  History of pneumatosis cystoides intestinalis  On chronic Flagyl  and pyridostigmine .  - Continue home Flagyl   - Resume home pyridostigmine   -  Low threshold to consult GI if not improving   Troponemia Likely secondary to demand ischemia. No complaint of chest pain. ECG similar to prior. Low suspicion for ACS.  - Continue to trend until  peak, if continues to uptrend or ECG with changes can start heparin  drip - Follow-up Echo  Chronic diastolic heart failure History of HTN HLD - Holding home valsartan  and furosemide  in the setting of shock - Holding home atorvastatin  to minimize PO intake in the setting of partial SBO, can resume as able  AKI on CKD stage 3a  Baseline Cr ~0.7-0.9. Due to hypovolemia and acute shock. - Continue to trend BMP - IVF per above - Renally dose medications, avoid nephrotoxins and ensure adequate renal perfusion   Best Practice (right click and Reselect all SmartList Selections daily)   Diet/type: clear liquids DVT prophylaxis prophylactic heparin   Pressure ulcer(s): N/A GI prophylaxis: PPI Lines: N/A Foley:  Yes, and it is still needed Code Status:  full code Last date of multidisciplinary goals of care discussion [n/a]  Labs   CBC: Recent Labs  Lab 07/15/24 0520 07/19/24 1445 07/20/24 0029  WBC 6.3 14.8* 14.6*  NEUTROABS 4.2  --   --   HGB 10.9* 11.9* 10.1*  HCT 33.9* 37.4 31.0*  MCV 93.1 95.7 92.0  PLT 207 255 209    Basic Metabolic Panel: Recent Labs  Lab 07/15/24 0520 07/15/24 2017 07/16/24 0221 07/19/24 1445 07/19/24 2016 07/19/24 2233 07/20/24 0029  NA 141  --  140 135  --  137 137  K 3.4*  --  4.3 5.8*  --  5.5* 5.2*  CL 110  --  103 97*  --  102 104  CO2 25  --  28 26  --  24 24  GLUCOSE 138*  --  92 171*  --  121* 111*  BUN 25*  --  18 52*  --  50* 50*  CREATININE 1.10*  --  1.01* 1.83*  --  1.61* 1.47*  CALCIUM  6.3*  --  7.6* 9.0  --  8.2* 8.1*  MG 0.6* 2.9* 2.6*  --  2.1  --  2.0  PHOS  --   --   --   --   --   --  4.2   GFR: Estimated Creatinine Clearance: 24.2 mL/min (A) (by C-G formula based on SCr of 1.47 mg/dL (H)). Recent Labs  Lab 07/15/24 0520 07/19/24 1445 07/19/24 1548 07/19/24 1758 07/20/24 0029  WBC 6.3 14.8*  --   --  14.6*  LATICACIDVEN  --   --  2.3* 1.2  --     Liver Function Tests: Recent Labs  Lab 07/15/24 0520  07/19/24 1445  AST 21 23  ALT 12 12  ALKPHOS 64 58  BILITOT 0.4 0.8  PROT 6.7 7.3  ALBUMIN 2.8* 3.3*   Recent Labs  Lab 07/19/24 1445  LIPASE 39   No results for input(s): AMMONIA in the last 168 hours.  ABG No results found for: PHART, PCO2ART, PO2ART, HCO3, TCO2, ACIDBASEDEF, O2SAT   Coagulation Profile: No results for input(s): INR, PROTIME in the last 168 hours.  Cardiac Enzymes: No results for input(s): CKTOTAL, CKMB, CKMBINDEX, TROPONINI in the last 168 hours.  HbA1C: Hemoglobin A1C  Date/Time Value Ref Range Status  01/18/2024 01:56 PM 5.3 4.0 - 5.6 % Final  01/13/2017 12:00 AM 8.1  Final  07/31/2016 12:00 AM 7.4  Final   HbA1c, POC (prediabetic range)  Date/Time Value Ref Range Status  10/03/2018  09:52 AM 5.8 5.7 - 6.4 % Final   HbA1c POC (<> result, manual entry)  Date/Time Value Ref Range Status  03/17/2023 11:05 AM 5.1 4.0 - 5.6 % Final   Hgb A1c MFr Bld  Date/Time Value Ref Range Status  08/04/2023 03:15 PM 5.6 4.8 - 5.6 % Final    Comment:    (NOTE)         Prediabetes: 5.7 - 6.4         Diabetes: >6.4         Glycemic control for adults with diabetes: <7.0   09/15/2022 09:02 AM 5.4 4.8 - 5.6 % Final    Comment:             Prediabetes: 5.7 - 6.4          Diabetes: >6.4          Glycemic control for adults with diabetes: <7.0     CBG: Recent Labs  Lab 07/19/24 2221 07/20/24 0007 07/20/24 0331 07/20/24 0712  GLUCAP 114* 105* 93 108*   Critical care time: 30 min    The patient is critically ill with multiple organ system failure and requires high complexity decision making for assessment and support, frequent evaluation and titration of therapies, advanced monitoring, review of radiographic studies and interpretation of complex data.   Critical Care Time devoted to patient care services, exclusive of separately billable procedures, described in this note is 30 minutes.  Rexene LOISE Blush, PA-C [place]  [facility] 07/20/24 9:52 AM  Please see [email or web address] for pager details.  From 7A-7P if no response, please call [phone] After hours, please call ELink [phone]

## 2024-07-20 NOTE — Plan of Care (Signed)
 Diarrhea with some formed stool 4x/4 hours. Very poor appetite.

## 2024-07-20 NOTE — TOC CM/SW Note (Signed)
 Transition of Care [facility]) - Inpatient Brief Assessment   Patient Details  Name: Andrea Brooks MRN: [id number] Date of Birth: 1936-10-07  Transition of Care [facility]) CM/SW Contact:    Tom-Johnson, Harvest Muskrat, RN Phone Number: 07/20/2024, 12:22 PM   Clinical Narrative:  Patient presented to the ED with Generalized Weakness, Dizziness, worsening Abdominal pain, N/V.  Patient was recently discharged from the Hospital on 07/16/24 for Hypomagnesemia and Hypocalcemia.  Admitted with Shock, on IV abx. CT showed partial Small Bowel Obstruction. NG tube ordered and was not placed. Patient improving, no further Vomiting, had a BM today.   CM spoke with patient at bedside about post hospital transition.  From home with husband and daughter, states they built an in-law quarters to their daughter's house. Modified independent, has all necessary DME's at home.  PCP is Chandra Toribio POUR, MD and uses [facility] and [facility].    No ICM needs or recommendations noted at this time.  Patient not Medically ready for discharge.  CM will continue to follow as patient progresses with care towards discharge.            Transition of Care Asessment: Insurance and Status: Insurance coverage has been reviewed Patient has primary care physician: Yes Home environment has been reviewed: Yes Prior level of function:: Modified Independent Prior/Current Home Services: No current home services Social Drivers of Health Review: SDOH reviewed no interventions necessary Readmission risk has been reviewed: Yes Transition of care needs: no transition of care needs at this time

## 2024-07-21 ENCOUNTER — Inpatient Hospital Stay (HOSPITAL_COMMUNITY)

## 2024-07-21 DIAGNOSIS — R579 Shock, unspecified: Secondary | ICD-10-CM | POA: Diagnosis not present

## 2024-07-21 DIAGNOSIS — K599 Functional intestinal disorder, unspecified: Secondary | ICD-10-CM

## 2024-07-21 DIAGNOSIS — K638219 Small intestinal bacterial overgrowth, unspecified: Secondary | ICD-10-CM

## 2024-07-21 DIAGNOSIS — I5032 Chronic diastolic (congestive) heart failure: Secondary | ICD-10-CM | POA: Diagnosis not present

## 2024-07-21 DIAGNOSIS — I10 Essential (primary) hypertension: Secondary | ICD-10-CM

## 2024-07-21 DIAGNOSIS — K660 Peritoneal adhesions (postprocedural) (postinfection): Secondary | ICD-10-CM

## 2024-07-21 DIAGNOSIS — K56600 Partial intestinal obstruction, unspecified as to cause: Secondary | ICD-10-CM

## 2024-07-21 DIAGNOSIS — D649 Anemia, unspecified: Secondary | ICD-10-CM

## 2024-07-21 DIAGNOSIS — E860 Dehydration: Principal | ICD-10-CM

## 2024-07-21 LAB — BASIC METABOLIC PANEL WITH GFR
Anion gap: 8 (ref 5–15)
BUN: 22 mg/dL (ref 8–23)
CO2: 24 mmol/L (ref 22–32)
Calcium: 8.2 mg/dL — ABNORMAL LOW (ref 8.9–10.3)
Chloride: 108 mmol/L (ref 98–111)
Creatinine, Ser: 0.98 mg/dL (ref 0.44–1.00)
GFR, Estimated: 57 mL/min — ABNORMAL LOW (ref >60)
Glucose, Bld: 86 mg/dL (ref 70–99)
Potassium: 4.3 mmol/L (ref 3.5–5.1)
Sodium: 140 mmol/L (ref 135–145)

## 2024-07-21 LAB — MAGNESIUM: Magnesium: 1.8 mg/dL (ref 1.7–2.4)

## 2024-07-21 LAB — CBC
HCT: 25.9 % — ABNORMAL LOW (ref 36.0–46.0)
Hemoglobin: 8.3 g/dL — ABNORMAL LOW (ref 12.0–15.0)
MCH: 30.2 pg (ref 26.0–34.0)
MCHC: 32 g/dL (ref 30.0–36.0)
MCV: 94.2 fL (ref 80.0–100.0)
Platelets: 137 K/uL — ABNORMAL LOW (ref 150–400)
RBC: 2.75 MIL/uL — ABNORMAL LOW (ref 3.87–5.11)
RDW: 13.3 % (ref 11.5–15.5)
WBC: 5.5 K/uL (ref 4.0–10.5)
nRBC: 0 % (ref 0.0–0.2)

## 2024-07-21 LAB — HEMOGLOBIN AND HEMATOCRIT, BLOOD
HCT: 27.2 % — ABNORMAL LOW (ref 36.0–46.0)
Hemoglobin: 8.8 g/dL — ABNORMAL LOW (ref 12.0–15.0)

## 2024-07-21 LAB — GLUCOSE, CAPILLARY
Glucose-Capillary: 77 mg/dL (ref 70–99)
Glucose-Capillary: 76 mg/dL (ref 70–99)
Glucose-Capillary: 83 mg/dL (ref 70–99)
Glucose-Capillary: 99 mg/dL (ref 70–99)

## 2024-07-21 LAB — PHOSPHORUS: Phosphorus: 3.5 mg/dL (ref 2.5–4.6)

## 2024-07-21 MED ORDER — MAGNESIUM SULFATE 2 GM/50ML IV SOLN
2.0000 g | Freq: Once | INTRAVENOUS | Status: AC
  Administered 2024-07-21: 2 g via INTRAVENOUS
  Filled 2024-07-21: qty 50

## 2024-07-21 MED ORDER — SODIUM CHLORIDE 0.9 % IV SOLN
2.0000 g | INTRAVENOUS | Status: AC
  Administered 2024-07-21 – 2024-07-23 (×3): 2 g via INTRAVENOUS
  Filled 2024-07-21 (×3): qty 20

## 2024-07-21 MED ORDER — PYRIDOSTIGMINE BROMIDE 60 MG PO TABS
60.0000 mg | ORAL_TABLET | Freq: Every day | ORAL | Status: DC
  Administered 2024-07-22 – 2024-07-23 (×2): 60 mg via ORAL
  Filled 2024-07-21 (×3): qty 1

## 2024-07-21 NOTE — Progress Notes (Signed)
 NAMEJeanell Brooks, MRN:  [id number], DOB:  Jun 12, 1936, LOS: 2 ADMISSION DATE:  07/19/2024, CONSULTATION DATE:  07/19/24 REFERRING MD:  EDP, CHIEF COMPLAINT:  n/v abdominal pain   History of Present Illness:  88 yo female presented with 3 days of n/v and abdominal pain. Pt and husband state that she was recently admitted (discharged 8/24) for hypomag and hypocalcemia. She was started on decreased dosing of omeprazole  and pyridostigmine  however today started with progressively worsening heartburn/indigestion around 0300. She states that she took multiple home remedies without any benefit. She continued to have worsening symptoms resulting in emesis that has been unrelenting. At this time her additional complaints are of generalized weakness and dizziness with movement. She continues to endorse nausea as well. No fever/chills/diarrhea. Denies anginal chest pain, sob, ha, cough or known recent sick contacts.   Of note pt never had NG placed as requested of the EDP when presented with partial SBO, she is improving from nausea standpoint and has had no further vomiting. Pt states that she did have BM earlier today but it was minimal compared to usual. She denies any medication changes, has been taking them as prescribed daily however with frequent emesis, she is unsure what she has been able to keep down.   Pertinent  Medical History  Hyperlipidemia Dm2 with hyperglycemia Htn Thalia H/o skin cancer HFpEF, lvef 2024 60-65% but grade 2 diastolic dysfunction CAD  Pneumatosis cystoides intestinalis, on chronic therapy (flagyl  and pyridostigmine )   Significant Hospital Events: Including procedures, antibiotic start and stop dates in addition to other pertinent events   8/27: Admitted to ICU on levophed   8/28: No further nausea/vomiting, weaning vasopressors   Interim History / Subjective:  Remains off pressors since 8/28 am No further nausea, tolerating clears, abd remains slightly tender to  palpation otherwise no pain 4 BM's overnight- some formed  Objective    Blood pressure 96/83, pulse 60, temperature 98 F (36.7 C), temperature source Oral, resp. rate (!) 22, weight 43.1 kg, SpO2 97%.        Intake/Output Summary (Last 24 hours) at 07/21/2024 1021 Last data filed at 07/21/2024 0800 Gross per 24 hour  Intake 3416.29 ml  Output 1150 ml  Net 2266.29 ml   Filed Weights   07/20/24 0500 07/21/24 0353  Weight: 59.4 kg 43.1 kg    Examination: General:  frail appearing elderly female sitting in bed in NAD HEENT: MM pink/moist Neuro: Alert, oriented, MAE, 1 person assist to [facility] CV: rr, 1 degree AV block PULM:  non labored, CTA GI: soft, bs+, mild upper abd tenderness, voids  Extremities: warm/dry, no pitting edema, suspected IV infiltration in R AC with mild swelling and tenderness Skin: no rashes   afebrile UOP 1.6L/ 24hrs plus one unmeasured Net +6.7L  Labs> H/H 10.1/ 31 > 8.3/ 25.9, plts 209> 137,   Resolved problem list   Assessment and Plan   Shock, resolved - presumed hypovolemic shock 2/2 N/V, less concerned for intra-abd sepsis, CT a/p showing possible transition point, ileus vs partial SBO, hx of pneumatosis cystoides intestinalis.  - remains off vasopressors, good oral intake, resolved AKI, good UOP.  Leukocytosis resolved - goal MAP > 65/ SBP > 100 - reduce MIVF 39ml/ hr - stable for transfer to progressive  - change cefepime  to ceftriaxone  for 5 days total, cont chronic flagyl  - trend WBC /fever curve - follow cultures, BC ngtd x 2 days  Partial small bowel obstruction vs ileus History of pneumatosis cystoides intestinalis  On chronic Flagyl  and pyridostigmine  and miralax  - tolerating clear liquid diet, abd remains soft.   4 BMS overnight, partially formed per nursing reports - holding home pyridostigmine  till further GI input.  [facility] consulted for further recs and management as she follows with [facility] showing continued improvement  of SB dilation in lower abd - flagyl  as above - cont PPI BID - optimize/ trend electrolytes, replete prn  Troponemia Likely secondary to demand ischemia. No complaint of chest pain. ECG similar to prior, unchanged 1st degree AV block. Low suspicion for ACS.  - echo 8/28 reassuring.  EF 60-65%, no RWMA, normal RV, moderate calcification of AV- no stenosis or regurgitation  Chronic diastolic heart failure History of HTN HLD - cont holding valsartan  and lasix  for now> remains low normotensive.   - resume atorvastatin  when diet advanced  AKI on CKD stage 3a, resolved  Baseline Cr ~0.7-0.9. Due to hypovolemia and acute shock. - stable sCr/ UOP - trend renal indices  - Renally dose medications, avoid nephrotoxins and ensure adequate renal perfusion   Normocytic anemia - slight H/H drop, suspect more hemodilutional component.  No obvious evidence of bleeding.  Will hold vte heparin  ppx and recheck H/H at 1700.  Send stool for hemoccult when able  - remains on PPI   Best Practice (right click and Reselect all SmartList Selections daily)   Diet/type: clear liquids DVT prophylaxis prophylactic heparin  > holding 8/29 Pressure ulcer(s): N/A GI prophylaxis: PPI Lines: N/A Foley:  N/A Code Status:  full code Last date of multidisciplinary goals of care discussion [n/a]  Pt updated on plan of care.  Will transfer to progressive and TRH as of 8/30.   Labs   CBC: Recent Labs  Lab 07/15/24 0520 07/19/24 1445 07/20/24 0029 07/21/24 0349  WBC 6.3 14.8* 14.6* 5.5  NEUTROABS 4.2  --   --   --   HGB 10.9* 11.9* 10.1* 8.3*  HCT 33.9* 37.4 31.0* 25.9*  MCV 93.1 95.7 92.0 94.2  PLT 207 255 209 137*    Basic Metabolic Panel: Recent Labs  Lab 07/16/24 0221 07/19/24 1445 07/19/24 2016 07/19/24 2233 07/20/24 0029 07/20/24 0815 07/20/24 1920 07/21/24 0349  NA 140   < >  --  137 137 139 138 140  K 4.3   < >  --  5.5* 5.2* 4.9 4.5 4.3  CL 103   < >  --  102 104 106 105 108  CO2  28   < >  --  24 24 22 23 24   GLUCOSE 92   < >  --  121* 111* 106* 99 86  BUN 18   < >  --  50* 50* 40* 29* 22  CREATININE 1.01*   < >  --  1.61* 1.47* 1.21* 0.99 0.98  CALCIUM  7.6*   < >  --  8.2* 8.1* 8.1* 8.2* 8.2*  MG 2.6*  --  2.1  --  2.0  --  1.8 1.8  PHOS  --   --   --   --  4.2  --   --  3.5   < > = values in this interval not displayed.   GFR: Estimated Creatinine Clearance: 28.6 mL/min (by C-G formula based on SCr of 0.98 mg/dL). Recent Labs  Lab 07/15/24 0520 07/19/24 1445 07/19/24 1548 07/19/24 1758 07/20/24 0029 07/20/24 1920 07/21/24 0349  WBC 6.3 14.8*  --   --  14.6*  --  5.5  LATICACIDVEN  --   --  2.3* 1.2  --  1.6  --     Liver Function Tests: Recent Labs  Lab 07/15/24 0520 07/19/24 1445 07/20/24 0815  AST 21 23 19   ALT 12 12 11   ALKPHOS 64 58 46  BILITOT 0.4 0.8 0.5  PROT 6.7 7.3 5.7*  ALBUMIN 2.8* 3.3* 2.5*   Recent Labs  Lab 07/19/24 1445  LIPASE 39   No results for input(s): AMMONIA in the last 168 hours.  ABG No results found for: PHART, PCO2ART, PO2ART, HCO3, TCO2, ACIDBASEDEF, O2SAT   Coagulation Profile: No results for input(s): INR, PROTIME in the last 168 hours.  Cardiac Enzymes: No results for input(s): CKTOTAL, CKMB, CKMBINDEX, TROPONINI in the last 168 hours.  HbA1C: Hemoglobin A1C  Date/Time Value Ref Range Status  01/18/2024 01:56 PM 5.3 4.0 - 5.6 % Final  01/13/2017 12:00 AM 8.1  Final  07/31/2016 12:00 AM 7.4  Final   HbA1c, POC (prediabetic range)  Date/Time Value Ref Range Status  10/03/2018 09:52 AM 5.8 5.7 - 6.4 % Final   HbA1c POC (<> result, manual entry)  Date/Time Value Ref Range Status  03/17/2023 11:05 AM 5.1 4.0 - 5.6 % Final   Hgb A1c MFr Bld  Date/Time Value Ref Range Status  07/19/2024 10:33 PM 5.6 4.8 - 5.6 % Final    Comment:    (NOTE)         Prediabetes: 5.7 - 6.4         Diabetes: >6.4         Glycemic control for adults with diabetes: <7.0   08/04/2023  03:15 PM 5.6 4.8 - 5.6 % Final    Comment:    (NOTE)         Prediabetes: 5.7 - 6.4         Diabetes: >6.4         Glycemic control for adults with diabetes: <7.0     CBG: Recent Labs  Lab 07/20/24 1503 07/20/24 1945 07/20/24 2332 07/21/24 0340 07/21/24 0729  GLUCAP 96 76 72 77 76   Critical care time: n/a       Lyle Pesa, MSN, AG-ACNP-BC [facility] 07/21/2024, 10:21 AM  See Amion for pager If no response to pager , please call [phone] until 7pm After 7:00 pm call Elink  [phone]

## 2024-07-21 NOTE — Consult Note (Addendum)
 Consultation  Referring Provider: CCM/Byrm Primary Care Physician:  Chandra Toribio POUR, MD Primary Gastroenterologist: [facility] GI/former Dan[facility]  Reason for Consultation: Hypovolemic shock resolved, history of pneumatosis cystoides intestinalis  HPI: Andrea Brooks is a 88 y.o. 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 GERD, status post prior cholecystectomy, hysterectomy and exploratory lap in 2022.  She carries a diagnosis of pneumatosis cystoides intestinalis which was diagnosed at the time of an exploratory lap done in 2022.  Has history of SIBO in Secondary to underlying chronic small bowel dysmotility..  She had been referred by Dr. Nikki to [facility] after that diagnosis and had been followed by Dr. Fonda Bloch.  She was last seen at [facility] in April 2024.  He has been managed with once daily low-dose metronidazole  250 mg, and pyridostigmine  60 mg daily, with as needed MiraLAX .  She is felt to have chronic dysmotility triggered by multiple previous bouts of E. coli gastroenteritis.  Per her last CT scan that had been done through [facility] in 2024 did not show any evidence of persistent pneumatosis intestinalis.  It did show concern for underlying adhesive disease and chronic small bowel dilation/dysmotility.  She also has several small bowel diverticuli.  Patient says she has been doing well over the past 6 months or so.  She has had some issues with intermittent hypomagnesemia and hypocalcemia and has required a couple of IV infusions, which had been arranged through Dr. Katharina office in the past.  She had not had any levels checked in the past couple of months. At baseline she usually feels okay and is able to eat though she does have episodes of poor appetite and abdominal bloating.  Her stools are always mushy and on a bad day she may have 6 or 8 bowel movements per day, and a good day 3-4 bowel movements per day.   Usually does not have total control of her bowels.  Had a brief admission earlier this week for IV magnesium  replacement. She then had an episode on 07/19/2024 where she woke up in the middle of the night with nausea, said her abdomen felt hard but not distended and then she started vomiting.  She had 5 or 6 episodes of vomiting was able to go back to bed, then the following day he tried to eat a little bit at breakfast time had 2 or 3 more episodes of nausea and vomiting and again a few hours later.  Her family then brought her to the ER with increasing weakness and fatigue.  NG tube was placed due to concerns with partial small bowel obstruction in the ER.  She was also hypotensive, did have some response to IV fluids but also was placed on Levophed .  Initial CT imaging 07/19/2024 shows scattered diverticulosis in the colon no diverticulitis, small bowel dilation throughout the abdomen with significant fecalization of bowel contents in the distal ileum no definite obstructing mass, although transition zone suggested at the ileocecal valve, stomach within normal limits. Initial labs with elevated lactate at 2.3 WBC 14.8/hemoglobin 11.9/hematocrit 37.4 Sodium 135/potassium 5.8/BUN 52/creatinine 1.83 Albumin 3.3 LFTs within normal limits Mg had been checked on 07/16/2024 after IV magnesium  and was 2.6.  NG was discontinued, she was started on clear liquids yesterday as follow-up abdominal films showed improvement/resolving small bowel obstruction.  She is now transferred to the regular floor, hypotension has resolved Repeat abdominal film today continued improvement of small bowel dilation. Has  been having a lot of acid reflux as her Prilosec had recently been discontinued because of the hypomagnesemia. She says she has a little bit of residual abdominal tenderness she is no longer nauseated would like to try to eat.  She has not had a bowel movement today but had had several bowel movements  yesterday.  Family is concerned about her management moving forward, and CCM asking for advice regarding her usual GI meds.    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   Left forearm and Left forearm superior    Past Surgical History:  Procedure Laterality Date   ABDOMINAL HYSTERECTOMY     total   BREAST BIOPSY Left    neg   CHOLECYSTECTOMY     JOINT REPLACEMENT Bilateral    knee   LAPAROTOMY N/A 03/11/2021   Procedure: EXPLORATORY LAPAROTOMY;  Surgeon: Signe Mitzie LABOR, MD;  Location: [facility] OR;  Service: General;  Laterality: N/A;   LEFT HEART CATH AND CORONARY ANGIOGRAPHY N/A 07/04/2018   Procedure: LEFT HEART CATH AND CORONARY ANGIOGRAPHY;  Surgeon: Dann Candyce RAMAN, MD;  Location: [facility] INVASIVE CV LAB;  Service: Cardiovascular;  Laterality: N/A;   REPLACEMENT TOTAL KNEE BILATERAL      Prior to Admission medications   Medication Sig Start Date End Date Taking? Authorizing Provider  acetaminophen  (TYLENOL ) 500 MG tablet Take 1 tablet (500 mg total) by mouth every 8 (eight) hours as needed for moderate pain. 08/09/23  Yes Dennise Lavada POUR, MD  aspirin  EC 81 MG tablet Take 81 mg by mouth daily.   Yes [provider]  carvedilol  (COREG ) 3.125 MG tablet Take 1 tablet (3.125 mg total) by mouth 2 (two) times daily with a meal. 07/14/24  Yes Hilty, Vinie BROCKS, MD  furosemide  (LASIX ) 40 MG tablet Take 1 tablet (40 mg total) by mouth daily. 07/16/24 07/16/25 Yes Wouk, Devaughn Sayres, MD  LORazepam  (ATIVAN ) 1 MG tablet TAKE 1/2 TO 1 TABLET AS    NEEDED FOR ANXIETY (MAXIMUMONCE DAILY) 03/24/24  Yes Chandra Toribio POUR, MD   Magnesium  200 MG CHEW Chew 400 mg by mouth 2 (two) times daily. 07/16/24  Yes Wouk, Devaughn Sayres, MD  metroNIDAZOLE  (FLAGYL ) 250 MG tablet Take 250 mg by mouth every morning.   Yes [provider]  Multiple Vitamins-Minerals (MULTI FOR HER 50+) TABS Take 1 tablet by mouth daily.   Yes [provider]  omeprazole  (PRILOSEC) 20 MG capsule TAKE 1 CAPSULE DAILY 30 MINUTES BEFORE BREAKFAST 09/21/22  Yes Zehr, Jessica D, PA-C  polyethylene glycol powder (GLYCOLAX /MIRALAX ) 17 GM/SCOOP powder Take 8.5 g by mouth as needed for moderate constipation.   Yes [provider]  pyridostigmine  (MESTINON ) 60 MG tablet Take 30 mg by mouth daily. 02/03/22  Yes [provider]  simvastatin  (ZOCOR ) 20 MG tablet Take 1 tablet (20 mg total) by mouth at bedtime. 01/19/24  Yes Hilty, Vinie BROCKS, MD  valsartan  (DIOVAN ) 320 MG tablet Take 1 tablet (320 mg total) by mouth daily. 01/19/24  Yes Hilty, Vinie BROCKS, MD    Current Facility-Administered  Medications  Medication Dose Route Frequency Provider Last Rate Last Admin   calcium  carbonate (TUMS - dosed in mg elemental calcium ) chewable tablet 400 mg of elemental calcium   400 mg of elemental calcium  Oral Daily PRN Tanda Rexene SAILOR, PA-C   200 mg of elemental calcium  at 07/20/24 1548   cefTRIAXone  (ROCEPHIN ) 2 g in sodium chloride  0.9 % 100 mL IVPB  2 g Intravenous Q24H Pham, Minh Q, RPH-CPP   Stopped at 07/21/24 1004   Chlorhexidine  Gluconate Cloth 2 % PADS 6 each  6 each Topical Daily Desai, Rahul P, PA-C   6 each at 07/20/24 1041   Chlorhexidine  Gluconate Cloth 2 % PADS 6 each  6 each Topical Q0600 Chandra Toribio POUR, MD   6 each at 07/20/24 0522   lactated ringers  infusion   Intravenous Continuous Antonetta Moccasin B, NP 50 mL/hr at 07/21/24 1200 Infusion Verify at 07/21/24 1200   metroNIDAZOLE  (FLAGYL ) tablet 250 mg  250 mg Oral q morning Tanda Rexene SAILOR, PA-C   250 mg at 07/21/24 9074   ondansetron  (ZOFRAN ) injection 4 mg  4 mg Intravenous Q6H PRN  Tanda Rexene SAILOR, PA-C   4 mg at 07/20/24 1552   Oral care mouth rinse  15 mL Mouth Rinse PRN Chandra Toribio POUR, MD       pantoprazole  (PROTONIX ) injection 40 mg  40 mg Intravenous Q24H Layman Raisin, DO   40 mg at 07/20/24 2152   [START ON 07/22/2024] pyridostigmine  (MESTINON ) tablet 60 mg  60 mg Oral Q1400 Esterwood, Amy S, PA-C        Allergies as of 07/19/2024 - Review Complete 07/19/2024  Allergen Reaction Noted   Tape Other (See Comments) 10/29/2019   Magnesium -containing compounds Diarrhea and Nausea And Vomiting 09/24/2023   Flagyl  [metronidazole ] Other (See Comments) 08/04/2023    Family History  Problem Relation Age of Onset   Hypertension Mother    Colon cancer Mother    Stroke Father    Heart failure Father    Stroke Sister    Stroke Brother    Graves' disease Daughter    Breast cancer Paternal Aunt    Graves' disease Sister    Bone cancer Sister    Breast cancer Maternal Aunt    Esophageal cancer Neg Hx    Rectal cancer Neg Hx    Liver cancer Neg Hx     Social History   Socioeconomic History   Marital status: Married    Spouse name: Not on file   Number of children: 1   Years of education: Not on file   Highest education level: Not on file  Occupational History   Occupation: retired  Tobacco Use   Smoking status: Former    Current packs/day: 0.00    Average packs/day: 1 pack/day for 2.0 years (2.0 ttl pk-yrs)    Types: Cigarettes    Start date: 11/23/1957    Quit date: 11/24/1959    Years since quitting: 64.7    Passive exposure: Past   Smokeless tobacco: Never  Vaping Use   Vaping status: Never Used  Substance and Sexual Activity   Alcohol use: No   Drug use: No   Sexual activity: Not Currently  Other Topics Concern   Not on file  Social History Narrative   Not on file   Social Drivers of Health   Financial Resource Strain: Low Risk  (04/08/2023)   Overall Financial Resource Strain (CARDIA)    Difficulty of Paying Living Expenses: Not hard at  all  Food Insecurity: No Food Insecurity (07/15/2024)   Hunger Vital Sign    Worried About Running Out of Food in the Last Year: Never true    Ran Out of Food in the Last Year: Never true  Transportation Needs: No Transportation Needs (07/15/2024)   PRAPARE - Administrator, Civil Service (Medical): No    Lack of Transportation (Non-Medical): No  Physical Activity: Inactive (04/08/2023)   Exercise Vital Sign    Days of Exercise per Week: 0 days    Minutes of Exercise per Session: 0 min  Stress: No Stress Concern Present (04/08/2023)   [employer] of Occupational Health - Occupational Stress Questionnaire    Feeling of Stress : Not at all  Social Connections: Moderately Isolated (07/15/2024)   Social Connection and Isolation Panel    Frequency of Communication with Friends and Family: More than three times a week    Frequency of Social Gatherings with Friends and Family: More than three times a week    Attends Religious Services: Never    Database administrator or Organizations: No    Attends Banker Meetings: Never    Marital Status: Married  Catering manager Violence: Not At Risk (07/15/2024)   Humiliation, Afraid, Rape, and Kick questionnaire    Fear of Current or Ex-Partner: No    Emotionally Abused: No    Physically Abused: No    Sexually Abused: No    Review of Systems: Pertinent positive and negative review of systems were noted in the above HPI section.  All other review of systems was otherwise negative.   Physical Exam: Vital signs in last 24 hours: Temp:  [96.6 F (35.9 C)-98 F (36.7 C)] 97.8 F (36.6 C) (08/29 1429) Pulse Rate:  [29-71] 59 (08/29 1400) Resp:  [14-24] 22 (08/29 1400) BP: (87-138)/(39-89) 138/56 (08/29 1300) SpO2:  [95 %-100 %] 96 % (08/29 1400) Weight:  [43.1 kg] 43.1 kg (08/29 0353) Last BM Date : 07/20/24 General:   Alert,  Well-developed, well-nourished, elderly white female pleasant and cooperative in NAD, family  at bedside Head:  Normocephalic and atraumatic. Eyes:  Sclera clear, no icterus.   Conjunctiva pink. Ears:  Normal auditory acuity. Nose:  No deformity, discharge,  or lesions. Mouth:  No deformity or lesions.   Neck:  Supple; no masses or thyromegaly. Lungs:  Clear throughout to auscultation.   No wheezes, crackles, or rhonchi.  Heart:  Regular rate and rhythm; no murmurs, clicks, rubs,  or gallops. Abdomen:  Soft, nondistended mild rather generalized mid abdominal tenderness, no guarding or rebound BS active, nonpalp mass or hsm.,  Several small incisional scars Rectal: Not done Msk:  Symmetrical without gross deformities. . Pulses:  Normal pulses noted. Extremities:  Without clubbing or edema. Neurologic:  Alert and  oriented x4;  grossly normal neurologically. Skin:  Intact without significant lesions or rashes.. Psych:  Alert and cooperative. Normal mood and affect.  Intake/Output from previous day: 08/28 0701 - 08/29 0700 In: 4999.9 [P.O.:700; I.V.:2230.5; IV Piggyback:2069.4] Out: 1600 [Urine:1600] Intake/Output this shift: Total I/O In: 628.3 [I.V.:478.3; IV Piggyback:150] Out: 350 [Urine:350]  Lab Results: Recent Labs    07/19/24 1445 07/20/24 0029 07/21/24 0349  WBC 14.8* 14.6* 5.5  HGB 11.9* 10.1* 8.3*  HCT 37.4 31.0* 25.9*  PLT 255 209 137*   BMET Recent Labs    07/20/24 0815 07/20/24 1920 07/21/24 0349  NA 139 138 140  K 4.9 4.5 4.3  CL 106 105 108  CO2 22 23 24   GLUCOSE 106* 99 86  BUN 40* 29* 22  CREATININE 1.21* 0.99 0.98  CALCIUM  8.1* 8.2* 8.2*   LFT Recent Labs    07/20/24 0815  PROT 5.7*  ALBUMIN 2.5*  AST 19  ALT 11  ALKPHOS 46  BILITOT 0.5   PT/INR No results for input(s): LABPROT, INR in the last 72 hours. Hepatitis Panel No results for input(s): HEPBSAG, HCVAB, HEPAIGM, HEPBIGM in the last 72 hours.    IMPRESSION:  #86 88 year old white female admitted with hypovolemic shock/hypotension secondary to multiple  episodes of vomiting in setting of partial small bowel obstruction.  Hypotension/shock resolved Associated acute kidney injury resolved  #2 history of pneumatosis cystoides intestinalis with initial diagnosis in 2022 in setting of chronic small bowel dysmotility and a known chronically dilated small bowel with multiple small bowel diverticuli.   Has been managed for SIBO with chronic low-dose metronidazole  in this setting. Was also started on Mestinon  after evaluation at [facility] to promote small bowel motility, and uses MiraLAX  at home as needed She has had prior abdominal surgeries, so her current partial small bowel obstruction may have been secondary to adhesive disease.  There is no CT evidence for recurrent pneumatosis cystoides  intestinalis by CT at this time.  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  Congestive heart failure with preserved EF #8 anemia-normocytic with drop in hemoglobin since admission likely dilutional  PLAN: Advance to full liquid diet and if tolerates advance to soft diet tomorrow Continue metronidazole  250 mg p.o. every morning Will restart Mestinon  at 60 mg daily in a.m. She needs to take a daily multivitamin, and had previously been on a magnesium  gummy which she tolerated fine and should resume that.  She will need to have repeat labs with CBC, be met, and magnesium  in a few weeks, and will need to have regular routine follow-up with her PCP to monitor her magnesium  and calcium .  Family is asking if she could follow-up with our practice/Dr. Danis-for regular GI follow-up rather than traveling back and forth to [facility] since she now has a diagnosis.  Will discuss with Dr. Legrand and she can be contacted next week  I think if she is able to advance her diet tomorrow and labs are otherwise stable she may be able to be  discharged this weekend   Amy EsterwoodPA-C  07/21/2024, 3:47 PM  I have taken an interval history, thoroughly reviewed the chart and examined the patient. I agree with the Advanced Practitioner's note, impression and recommendations, and have recorded additional findings, impressions and recommendations below. I performed a substantive portion of this encounter (>50% time spent), including a complete performance of the medical decision making. I also personally reviewed CT abdomen and pelvis images My additional thoughts are as follows:  Patient known to me from outpatient evaluation a few years back and then subsequent referral to [facility].  She has a severe small bowel dysmotility with resultant SIBO, and on this occasion was admitted for small bowel obstruction.  I cannot determine if this was from change in motility that was slowly coming on over days to weeks or something mechanical like an intra-abdominal adhesion. Either way she is thankfully much improved at this point and able to tolerate oral nutrition.  Slowly increasing diet and resuming her chronic metronidazole  and increasing her Mestinon  back to the 60 mg it was  prior to the halving of the dose about 6 months ago.  The dose was apparently halved because she was having more frequent and loose stool, and we will just have to monitor her bowel frequency inform to see about optimal dosing. For the time being I will need her to follow-up regularly with the [facility] specialty clinic as this is a problem with medicines I am not accustomed to managing.  She also needs regular close primary care follow-up to monitor her electrolytes (particular magnesium ) and arrange outpatient IV treatment when needed. _________________  This consultation required a high degree of medical decision making due to the nature and complexity of the acute condition(s) being evaluated as well as the patient's medical comorbidities.  Victory LITTIE Brand  III Office:[phone]

## 2024-07-21 NOTE — Progress Notes (Signed)
 Patient transport to 4E room 27, without any complication all belongings place at bedside.

## 2024-07-22 DIAGNOSIS — R579 Shock, unspecified: Secondary | ICD-10-CM | POA: Diagnosis not present

## 2024-07-22 LAB — MAGNESIUM: Magnesium: 2.1 mg/dL (ref 1.7–2.4)

## 2024-07-22 LAB — BASIC METABOLIC PANEL WITH GFR
Anion gap: 6 (ref 5–15)
BUN: 12 mg/dL (ref 8–23)
CO2: 25 mmol/L (ref 22–32)
Calcium: 8 mg/dL — ABNORMAL LOW (ref 8.9–10.3)
Chloride: 108 mmol/L (ref 98–111)
Creatinine, Ser: 0.91 mg/dL (ref 0.44–1.00)
GFR, Estimated: >60 mL/min (ref >60)
Glucose, Bld: 90 mg/dL (ref 70–99)
Potassium: 4 mmol/L (ref 3.5–5.1)
Sodium: 139 mmol/L (ref 135–145)

## 2024-07-22 LAB — CBC
HCT: 25.2 % — ABNORMAL LOW (ref 36.0–46.0)
Hemoglobin: 8 g/dL — ABNORMAL LOW (ref 12.0–15.0)
MCH: 30 pg (ref 26.0–34.0)
MCHC: 31.7 g/dL (ref 30.0–36.0)
MCV: 94.4 fL (ref 80.0–100.0)
Platelets: 126 K/uL — ABNORMAL LOW (ref 150–400)
RBC: 2.67 MIL/uL — ABNORMAL LOW (ref 3.87–5.11)
RDW: 13.2 % (ref 11.5–15.5)
WBC: 4 K/uL (ref 4.0–10.5)
nRBC: 0 % (ref 0.0–0.2)

## 2024-07-22 LAB — GLUCOSE, CAPILLARY
Glucose-Capillary: 99 mg/dL (ref 70–99)
Glucose-Capillary: 72 mg/dL (ref 70–99)
Glucose-Capillary: 103 mg/dL — ABNORMAL HIGH (ref 70–99)

## 2024-07-22 LAB — CALCIUM, IONIZED: Calcium, Ionized, Serum: 4.8 mg/dL (ref 4.5–5.6)

## 2024-07-22 LAB — PHOSPHORUS: Phosphorus: 2.9 mg/dL (ref 2.5–4.6)

## 2024-07-22 MED ORDER — PANTOPRAZOLE SODIUM 40 MG PO TBEC
40.0000 mg | DELAYED_RELEASE_TABLET | Freq: Every day | ORAL | Status: DC
  Administered 2024-07-22 – 2024-07-23 (×2): 40 mg via ORAL
  Filled 2024-07-22 (×2): qty 1

## 2024-07-22 NOTE — Evaluation (Signed)
 Physical Therapy Evaluation Patient Details Name: Andrea Brooks MRN: [id number] DOB: July 13, 1936 Today's Date: 07/22/2024  History of Present Illness  Pt is 88 yo presenting to [facility] on 8/27 due to nausea, vomiting and abdominal pain that started 3 days prior to admission. Pt started on pressors for hypovolemic shock and was found to have small bowel obstruction. NG tube was placed. Pt weaned off pressors 8/28 CT scan with findings of no recurrent pneumatosis intestinalis per MD note but today possible SBO. Pmh: recent hospitalization 8/24 for hypomagnesemia and hypocalcemia,  anxiety, CHF, colon polyps, DM, civerticulosis, gall stones, GERD, hyperlipidemia, HTN, non-STEMI, SCC in situ  Clinical Impression  Pt is presenting at Mod I for bed mobility, sit to stand and gait with RW. Pt has very supportive family. Pt is reporting tightness with the abdominal area with difficulty getting a full breathe in with upright standing most likely related to abdominal adhesions. Once feeling better would benefit from women's health physical therapy in for myofascial release of the abdomen and easy abdominal stretching in order to improve posture and activity tolerance. Currently pt is presenting at baseline level of functioning and no skilled physical therapy services recommended. Pt will be discharged from skilled physical therapy services at this time; please re-consult if further needs arise.          If plan is discharge home, recommend the following: Help with stairs or ramp for entrance;Assistance with cooking/housework     Equipment Recommendations None recommended by PT     Functional Status Assessment Patient has not had a recent decline in their functional status     Precautions / Restrictions Precautions Precautions: Fall Recall of Precautions/Restrictions: Intact Restrictions Weight Bearing Restrictions Per Provider Order: No      Mobility  Bed Mobility Overal bed mobility: Modified  Independent      Transfers Overall transfer level: Modified independent Equipment used: Rolling walker (2 wheels)     General transfer comment: Good hand placement.    Ambulation/Gait Ambulation/Gait assistance: Modified independent (Device/Increase time) Gait Distance (Feet): 200 Feet Assistive device: Rolling walker (2 wheels) Gait Pattern/deviations: Step-through pattern, Trunk flexed, Decreased stride length Gait velocity: slightly decreased Gait velocity interpretation: 1.31 - 2.62 ft/sec, indicative of limited community ambulator   General Gait Details: Pt unable to stand upright due to abdominal adhesions.     Balance Overall balance assessment: Mild deficits observed, not formally tested       Pertinent Vitals/Pain Pain Assessment Pain Assessment: No/denies pain    Home Living Family/patient expects to be discharged to:: Private residence Living Arrangements: Spouse/significant other;Children Available Help at Discharge: Family;Available 24 hours/day Type of Home: House Home Access: Stairs to enter Entrance Stairs-Rails: Left Entrance Stairs-Number of Steps: 3 or 8 stairs   Home Layout: Able to live on main level with bedroom/bathroom;Multi-level Home Equipment: Rolling Walker (2 wheels);Cane - single point;Rollator (4 wheels);Grab bars - tub/shower;Grab bars - toilet;Shower seat - built in;BSC/3in1;Wheelchair - manual;Hand held shower head      Prior Function Prior Level of Function : Independent/Modified Independent;Driving             Mobility Comments: Pt uses SPC at home and walking stick out in the community. ADLs Comments: Pt is mod I with AD has someone come in a clean every other week.     Extremity/Trunk Assessment   Upper Extremity Assessment Upper Extremity Assessment: Overall WFL for tasks assessed    Lower Extremity Assessment Lower Extremity Assessment: Overall WFL for tasks assessed  Cervical / Trunk Assessment Cervical /  Trunk Assessment: Kyphotic  Communication   Communication Communication: No apparent difficulties    Cognition Arousal: Alert Behavior During Therapy: WFL for tasks assessed/performed   PT - Cognitive impairments: No apparent impairments     Following commands: Intact       Cueing Cueing Techniques: Verbal cues     General Comments General comments (skin integrity, edema, etc.): Spouse and daughter present and very supportive.        Assessment/Plan    PT Assessment Patient does not need any further PT services;All further PT needs can be met in the next venue of care  PT Problem List Decreased range of motion;Decreased activity tolerance           PT Goals (Current goals can be found in the Care Plan section)  Acute Rehab PT Goals PT Goal Formulation: All assessment and education complete, DC therapy            AM-PAC PT 6 Clicks Mobility  Outcome Measure Help needed turning from your back to your side while in a flat bed without using bedrails?: None Help needed moving from lying on your back to sitting on the side of a flat bed without using bedrails?: None Help needed moving to and from a bed to a chair (including a wheelchair)?: None Help needed standing up from a chair using your arms (e.g., wheelchair or bedside chair)?: None Help needed to walk in hospital room?: None Help needed climbing 3-5 steps with a railing? : A Little 6 Click Score: 23    End of Session Equipment Utilized During Treatment: Gait belt Activity Tolerance: Patient tolerated treatment well Patient left: in bed;with call bell/phone within reach;with family/visitor present Nurse Communication: Mobility status PT Visit Diagnosis: Other abnormalities of gait and mobility (R26.89)    Time: 8847-8779 PT Time Calculation (min) (ACUTE ONLY): 28 min   Charges:   PT Evaluation $PT Eval Low Complexity: 1 Low PT Treatments $Therapeutic Activity: 8-22 mins PT General Charges $$ ACUTE  PT VISIT: 1 Visit        Dorothyann Maier, DPT, CLT  Acute Rehabilitation Services Office: [phone] (Secure chat preferred)   Dorothyann VEAR Maier 07/22/2024, 12:26 PM

## 2024-07-22 NOTE — Evaluation (Signed)
 Occupational Therapy Evaluation Patient Details Name: Andrea Brooks MRN: [id number] DOB: 1936-03-10 Today's Date: 07/22/2024   History of Present Illness   Pt is 88 yo presenting to [facility] on 8/27 due to nausea, vomiting and abdominal pain that started 3 days prior to admission. Pt started on pressors for hypovolemic shock and was found to have small bowel obstruction. NG tube was placed. Pt weaned off pressors 8/28 CT scan with findings of no recurrent pneumatosis intestinalis per MD note but today possible SBO. Pmh: recent hospitalization 8/24 for hypomagnesemia and hypocalcemia,  anxiety, CHF, colon polyps, DM, civerticulosis, gall stones, GERD, hyperlipidemia, HTN, non-STEMI, SCC in situ     Clinical Impressions Pt presented with daughter and husband in the room. Pt reported they will be able to have their assist and granson with the return to home. Pt at this time is mod I for  to set up for all ADLs. She was educated about IS (have at home), safety with mobility and modifications with UE/LE ADLS if needed. She was able to ambulate with RW with supervision and no DME with [facility]. At this time Acute Occupational Therapy singing off and no follow up recommendations.      If plan is discharge home, recommend the following:   Help with stairs or ramp for entrance     Functional Status Assessment   Patient has had a recent decline in their functional status and demonstrates the ability to make significant improvements in function in a reasonable and predictable amount of time.     Equipment Recommendations   None recommended by OT     Recommendations for Other Services         Precautions/Restrictions   Precautions Precautions: Fall Recall of Precautions/Restrictions: Intact Restrictions Weight Bearing Restrictions Per Provider Order: No     Mobility Bed Mobility Overal bed mobility: Modified Independent                  Transfers Overall transfer level:  Modified independent Equipment used: Rolling walker (2 wheels)               General transfer comment: Good hand placement.      Balance Overall balance assessment: Mild deficits observed, not formally tested                                         ADL either performed or assessed with clinical judgement   ADL Overall ADL's : Needs assistance/impaired Eating/Feeding: Independent;Sitting   Grooming: Wash/dry hands;Wash/dry face;Supervision/safety;Standing   Upper Body Bathing: Set up;Sitting   Lower Body Bathing: Set up;Sit to/from stand   Upper Body Dressing : Set up;Sitting   Lower Body Dressing: Set up;Sit to/from stand   Toilet Transfer: Supervision/safety;Rolling walker (2 wheels)   Toileting- Clothing Manipulation and Hygiene: Supervision/safety;Sit to/from stand   Tub/ Shower Transfer: Supervision/safety;Ambulation   Functional mobility during ADLs: Supervision/safety;Rolling walker (2 wheels)       Vision Baseline Vision/History: 1 Wears glasses Ability to See in Adequate Light: 0 Adequate Patient Visual Report: No change from baseline Vision Assessment?: No apparent visual deficits     Perception Perception: Within Functional Limits       Praxis Praxis: WFL       Pertinent Vitals/Pain Pain Assessment Pain Assessment: No/denies pain     Extremity/Trunk Assessment Upper Extremity Assessment Upper Extremity Assessment: RUE deficits/detail;LUE deficits/detail RUE Deficits / Details: Pt  limited AROM to to about 90 degrees shoulder flexion/abduction with BUE LUE Deficits / Details: Pt limited AROM to to about 90 degrees shoulder flexion/abduction with BUE   Lower Extremity Assessment Lower Extremity Assessment: Overall WFL for tasks assessed   Cervical / Trunk Assessment Cervical / Trunk Assessment: Kyphotic   Communication Communication Communication: No apparent difficulties   Cognition Arousal: Alert Behavior During  Therapy: WFL for tasks assessed/performed                                 Following commands: Intact       Cueing  General Comments   Cueing Techniques: Verbal cues  spouse and daughter presented in session   Exercises     Shoulder Instructions      Home Living Family/patient expects to be discharged to:: Private residence Living Arrangements: Spouse/significant other;Children Available Help at Discharge: Family;Available 24 hours/day Type of Home: House Home Access: Stairs to enter [employer] of Steps: 3 or 8 stairs Entrance Stairs-Rails: Left Home Layout: Able to live on main level with bedroom/bathroom;Multi-level     Bathroom Shower/Tub: Arts development officer Toilet: Handicapped height     Home Equipment: Agricultural consultant (2 wheels);Cane - single point;Rollator (4 wheels);Grab bars - tub/shower;Grab bars - toilet;Shower seat - built in;BSC/3in1;Wheelchair - manual;Hand held shower head          Prior Functioning/Environment Prior Level of Function : Independent/Modified Independent;Driving             Mobility Comments: Pt uses SPC at home and walking stick out in the community. ADLs Comments: Pt is mod I with AD has someone come in a clean every other week.    OT Problem List: Decreased strength;Decreased activity tolerance;Decreased knowledge of use of DME or AE   OT Treatment/Interventions:        OT Goals(Current goals can be found in the care plan section)   Acute Rehab OT Goals Patient Stated Goal: to go home OT Goal Formulation: With patient Time For Goal Achievement: 08/05/24 Potential to Achieve Goals: Good   OT Frequency:       Co-evaluation              AM-PAC OT 6 Clicks Daily Activity     Outcome Measure Help from another person eating meals?: None Help from another person taking care of personal grooming?: None Help from another person toileting, which includes using toliet, bedpan, or  urinal?: None Help from another person bathing (including washing, rinsing, drying)?: None Help from another person to put on and taking off regular upper body clothing?: None Help from another person to put on and taking off regular lower body clothing?: None 6 Click Score: 24   End of Session Equipment Utilized During Treatment: Gait belt Nurse Communication: Mobility status  Activity Tolerance: Patient tolerated treatment well Patient left: in bed;with call bell/phone within reach  OT Visit Diagnosis: Unsteadiness on feet (R26.81);Other abnormalities of gait and mobility (R26.89);Repeated falls (R29.6);Muscle weakness (generalized) (M62.81)                Time: 1446-1510 OT Time Calculation (min): 24 min Charges:  OT General Charges $OT Visit: 1 Visit OT Evaluation $OT Eval Low Complexity: 1 Low OT Treatments $Self Care/Home Management : 8-22 mins   Warrick POUR OTR/L  Acute Rehab Services  [phone] office number   Warrick Berber 07/22/2024, 3:23 PM

## 2024-07-22 NOTE — Progress Notes (Signed)
 [facility] PROGRESS NOTE    Progress Note  Andrea Brooks  FMW:[id number] DOB: [birth year] DOA: 07/19/2024 PCP: Chandra Toribio POUR, MD     Brief Narrative:   Andrea Brooks is an 88 y.o. female past medical history of diabetes mellitus type 2, chronic diastolic dysfunction, pneumatosis intestinalis diagnosed in 2022 in the setting of small bowel dysmotility on chronic Flagyl  and pyridostigmine , with multiple admissions and abdominal surgeries for small bowel obstruction, recently discharged from the hospital on 07/16/2024 for hypomagnesemia and hypocalcemia presents with nausea vomiting abdominal pain that started 3 days prior to admission, admitted under PCCM started on pressors for hypovolemic shock, was found to have a small bowel obstruction for which NG tube was placed on admission.  Weaned off pressors on 07/20/2024 CT scan of the abdomen pelvis showed no recurrent pneumatosis intestinalis but today showed possible small bowel obstruction   Assessment/Plan:   Hypovolemic shock: Secondary to severe nausea and vomiting. CT scan of the abdomen pelvis showed ileus versus small bowel obstruction. Initially started on pressors was also found to be in acute kidney injury. She will complete a 5 days of Rocephin  in house. She has defervesced leukocytosis improved. Blood cultures from 07/19/2024 remain negative till date. She will need to continue her chronic Flagyl . NG tube has been discontinued. Out of bed to chair and consult physical therapy  Partial small bowel obstruction : The patient with a history of pneumatosis intestinalis. At home she is on chronic Flagyl  and pyridostigmine  Initially had an NG tube which was eventually removed she has had 4 bowel movement and tolerating her diet. Abdominal x-ray showed improved small bowel dilation in the lower abdomen. Continue Flagyl  and PPI. GI was consulted recommended to continue Flagyl  and start Mestinon  on 07/22/2024. Continue to  replete electrolytes try to keep potassium greater than 4 magnesium  greater than 2. Continue liquid diet advance as tolerated Consult PT OT, she like to avoid skilled nursing facility. She would like her diet advanced as tolerated.  Acute kidney injury on chronic kidney disease stage IIIb: With a baseline creatinine of less than 1, started on IV fluids. Likely hemodynamic mediated, her creatinine has returned to baseline.  Elevated troponins:  Secondary demand ischemia she denies any chest pain or shortness of breath. 2D echo showed preserved EF no wall motion abnormality.  HFpEF/essential hypertension: Holding ARB and Lasix . She remains normotensive.  Continue statins.  Normocytic anemia: Currently on PPI.  Hemoglobin has remained relatively stable.  Continue to monitor intermittently.   DVT prophylaxis: scd Family Communication:none Status is: Inpatient Remains inpatient appropriate because: Hypovolemic shock    Code Status:     Code Status Orders  (From admission, onward)           Start     Ordered   07/19/24 2103  Full code  Continuous       Question:  By:  Answer:  Consent: discussion documented in EHR   07/19/24 2103           Code Status History     Date Active Date Inactive Code Status Order ID Comments User Context   07/15/2024 0742 07/16/2024 1956 Full Code [id number]  Georgina Basket, MD ED   08/04/2023 1512 08/09/2023 2109 Full Code [id number]  Arlice Reichert, MD ED   03/12/2021 0036 03/16/2021 1955 Full Code [id number]  Tobie Jorie SAUNDERS, MD Inpatient   03/12/2021 0000 03/12/2021 0036 Full Code [id number]  Signe Mitzie LABOR, MD Inpatient   07/02/2018 2311 07/05/2018 2033 Full Code [id number]  Lonzell Emeline HERO, DO ED   06/28/2018 1719 07/02/2018 1551 Full Code [id number]  Perri DELENA Meliton Mickey., MD Inpatient         IV Access:   Peripheral IV   Procedures and diagnostic studies:   DG Abd 1 View Result Date: 07/21/2024 EXAM: 1 VIEW XRAY OF THE ABDOMEN  07/21/2024 06:50:00 AM COMPARISON: 07/20/2024 CLINICAL HISTORY: Partial small bowel obstruction FINDINGS: BOWEL: Interval improvement of small-bowel dilation in the low abdomen. SOFT TISSUES: Pelvic phleboliths. BONES: No acute osseous abnormality. IMPRESSION: 1. Interval improvement of small-bowel dilation in the low abdomen. Electronically signed by: Waddell Calk MD 07/21/2024 09:36 AM EDT RP Workstation: [id number]   ECHOCARDIOGRAM COMPLETE Result Date: 07/20/2024    ECHOCARDIOGRAM REPORT   Patient Name:   Andrea Brooks Date of Exam: 07/20/2024 Medical Rec #:  [id number]      Height:       64.0 in Accession #:    [id number]     Weight:       131.0 lb Date of Birth:  Feb 06, 1936       BSA:          1.634 m Patient Age:    87 years       BP:           107/43 mmHg Patient Gender: F              HR:           55 bpm. Exam Location:  Inpatient Procedure: 2D Echo, 3D Echo, Cardiac Doppler and Color Doppler (Both Spectral            and Color Flow Doppler were utilized during procedure). Indications:    Shock  History:        Patient has prior history of Echocardiogram examinations, most                 recent 08/05/2023. Risk Factors:Diabetes and Dyslipidemia.  Sonographer:    Therisa Crouch Referring Phys: [id number] JESSICA MARSHALL IMPRESSIONS  1. Left ventricular ejection fraction, by estimation, is 60 to 65%. Left ventricular ejection fraction by 3D volume is 65 %. The left ventricle has normal function. The left ventricle has no regional wall motion abnormalities. Left ventricular diastolic  parameters are indeterminate.  2. Right ventricular systolic function is normal. The right ventricular size is normal. There is normal pulmonary artery systolic pressure. The estimated right ventricular systolic pressure is 28.0 mmHg.  3. The mitral valve is normal in structure. No evidence of mitral valve regurgitation. No evidence of mitral stenosis.  4. The aortic valve is normal in structure. There is moderate calcification of  the aortic valve. Aortic valve regurgitation is not visualized. No aortic stenosis is present. Aortic valve mean gradient measures 5.0 mmHg.  5. The inferior vena cava is normal in size with greater than 50% respiratory variability, suggesting right atrial pressure of 3 mmHg. FINDINGS  Left Ventricle: Left ventricular ejection fraction, by estimation, is 60 to 65%. Left ventricular ejection fraction by 3D volume is 65 %. The left ventricle has normal function. The left ventricle has no regional wall motion abnormalities. The left ventricular internal cavity size was normal in size. There is no left ventricular hypertrophy. Left ventricular diastolic parameters are indeterminate. Right Ventricle: The right ventricular size is normal. No increase in right ventricular wall thickness. Right ventricular systolic function is normal. There is normal pulmonary artery systolic pressure. The tricuspid regurgitant velocity is 2.50 m/s, and  with an assumed right  atrial pressure of 3 mmHg, the estimated right ventricular systolic pressure is 28.0 mmHg. Left Atrium: Left atrial size was normal in size. Right Atrium: Right atrial size was normal in size. Pericardium: There is no evidence of pericardial effusion. Mitral Valve: The mitral valve is normal in structure. Mild mitral annular calcification. No evidence of mitral valve regurgitation. No evidence of mitral valve stenosis. Tricuspid Valve: The tricuspid valve is normal in structure. Tricuspid valve regurgitation is trivial. No evidence of tricuspid stenosis. Aortic Valve: The aortic valve is normal in structure. There is moderate calcification of the aortic valve. Aortic valve regurgitation is not visualized. No aortic stenosis is present. Aortic valve mean gradient measures 5.0 mmHg. Aortic valve peak gradient measures 8.3 mmHg. Aortic valve area, by VTI measures 2.32 cm. Pulmonic Valve: The pulmonic valve was normal in structure. Pulmonic valve regurgitation is trivial.  No evidence of pulmonic stenosis. Aorta: The aortic root is normal in size and structure. Venous: The inferior vena cava is normal in size with greater than 50% respiratory variability, suggesting right atrial pressure of 3 mmHg. IAS/Shunts: No atrial level shunt detected by color flow Doppler. Additional Comments: 3D was performed not requiring image post processing on an independent workstation and was normal.  LEFT VENTRICLE PLAX 2D LVIDd:         4.45 cm         Diastology LVIDs:         2.80 cm         LV e' medial:    6.96 cm/s LV PW:         1.00 cm         LV E/e' medial:  11.3 LV IVS:        1.05 cm         LV e' lateral:   8.70 cm/s LVOT diam:     2.00 cm         LV E/e' lateral: 9.0 LV SV:         80 LV SV Index:   49 LVOT Area:     3.14 cm        3D Volume EF                                LV 3D EF:    Left                                             ventricul                                             ar                                             ejection                                             fraction  by 3D                                             volume is                                             65 %.                                LV 3D EDV:   64.63 ml                                LV 3D ESV:   21.99 ml                                 3D Volume EF:                                3D EF:        65 % RIGHT VENTRICLE            IVC RV Basal diam:  3.35 cm    IVC diam: 1.38 cm RV S prime:     9.14 cm/s TAPSE (M-mode): 1.4 cm LEFT ATRIUM             Index LA diam:        4.23 cm 2.59 cm/m LA Vol (A2C):   38.3 ml 23.44 ml/m LA Vol (A4C):   49.4 ml 30.23 ml/m LA Biplane Vol: 44.6 ml 27.29 ml/m  AORTIC VALVE AV Area (Vmax):    2.57 cm AV Area (Vmean):   2.72 cm AV Area (VTI):     2.32 cm AV Vmax:           144.00 cm/s AV Vmean:          103.000 cm/s AV VTI:            0.347 m AV Peak Grad:      8.3 mmHg AV Mean Grad:      5.0 mmHg LVOT Vmax:          118.00 cm/s LVOT Vmean:        89.100 cm/s LVOT VTI:          0.256 m LVOT/AV VTI ratio: 0.74  AORTA Ao Asc diam: 3.28 cm MITRAL VALVE               TRICUSPID VALVE MV Area (PHT): 2.28 cm    TV Peak grad:   25.0 mmHg MV Decel Time: 333 msec    TV Vmax:        2.50 m/s MV E velocity: 78.60 cm/s  TR Peak grad:   25.0 mmHg MV A velocity: 98.50 cm/s  TR Vmax:        250.00 cm/s MV E/A ratio:  0.80                            SHUNTS  Systemic VTI:  0.26 m                            Systemic Diam: 2.00 cm Aditya Sabharwal Electronically signed by Ria Commander Signature Date/Time: 07/20/2024/8:58:41 AM    Final      Medical Consultants:   None.   Subjective:    Andrea Brooks relates she is tolerating her diet she is hungry she would like her diet advanced  Objective:    Vitals:   07/21/24 1933 07/21/24 2221 07/22/24 0331 07/22/24 0600  BP: (!) 136/55 120/65 (!) 121/50   Pulse: 60 (!) 56 (!) 54   Resp: (!) 24 16 19    Temp: 97.7 F (36.5 C) 97.7 F (36.5 C) 97.6 F (36.4 C)   TempSrc: Oral Oral Oral   SpO2: 100% 100% 95%   Weight:    61.8 kg   SpO2: 95 % O2 Flow Rate (L/min): 4 L/min   Intake/Output Summary (Last 24 hours) at 07/22/2024 0726 Last data filed at 07/22/2024 0416 Gross per 24 hour  Intake 1706.27 ml  Output 350 ml  Net 1356.27 ml   Filed Weights   07/20/24 0500 07/21/24 0353 07/22/24 0600  Weight: 59.4 kg 43.1 kg 61.8 kg    Exam: General exam: In no acute distress. Respiratory system: Good air movement and clear to auscultation. Cardiovascular system: S1 & S2 heard, RRR. No JVD. Gastrointestinal system: Abdomen is nondistended, soft and nontender.  Extremities: No pedal edema. Skin: No rashes, lesions or ulcers Psychiatry: Judgement and insight appear normal. Mood & affect appropriate.    Data Reviewed:    Labs: Basic Metabolic Panel: Recent Labs  Lab 07/19/24 2016 07/19/24 2233 07/20/24 0029 07/20/24 0815  07/20/24 1920 07/21/24 0349 07/22/24 0325  NA  --    < > 137 139 138 140 139  K  --    < > 5.2* 4.9 4.5 4.3 4.0  CL  --    < > 104 106 105 108 108  CO2  --    < > 24 22 23 24 25   GLUCOSE  --    < > 111* 106* 99 86 90  BUN  --    < > 50* 40* 29* 22 12  CREATININE  --    < > 1.47* 1.21* 0.99 0.98 0.91  CALCIUM   --    < > 8.1* 8.1* 8.2* 8.2* 8.0*  MG 2.1  --  2.0  --  1.8 1.8 2.1  PHOS  --   --  4.2  --   --  3.5 2.9   < > = values in this interval not displayed.   GFR Estimated Creatinine Clearance: 39 mL/min (by C-G formula based on SCr of 0.91 mg/dL). Liver Function Tests: Recent Labs  Lab 07/19/24 1445 07/20/24 0815  AST 23 19  ALT 12 11  ALKPHOS 58 46  BILITOT 0.8 0.5  PROT 7.3 5.7*  ALBUMIN 3.3* 2.5*   Recent Labs  Lab 07/19/24 1445  LIPASE 39   No results for input(s): AMMONIA in the last 168 hours. Coagulation profile No results for input(s): INR, PROTIME in the last 168 hours. COVID-19 Labs  No results for input(s): DDIMER, FERRITIN, LDH, CRP in the last 72 hours.  Lab Results  Component Value Date   SARSCOV2NAA NEGATIVE 08/04/2023   SARSCOV2NAA NEGATIVE 07/22/2023   SARSCOV2NAA Not Detected 05/09/2022   SARSCOV2NAA NEGATIVE 03/11/2021    CBC: Recent Labs  Lab 07/19/24 1445 07/20/24 0029 07/21/24 0349 07/21/24 1711 07/22/24 0325  WBC 14.8* 14.6* 5.5  --  4.0  HGB 11.9* 10.1* 8.3* 8.8* 8.0*  HCT 37.4 31.0* 25.9* 27.2* 25.2*  MCV 95.7 92.0 94.2  --  94.4  PLT 255 209 137*  --  126*   Cardiac Enzymes: No results for input(s): CKTOTAL, CKMB, CKMBINDEX, TROPONINI in the last 168 hours. BNP (last 3 results) No results for input(s): PROBNP in the last 8760 hours. CBG: Recent Labs  Lab 07/21/24 0340 07/21/24 0729 07/21/24 1106 07/21/24 1925 07/22/24 0609  GLUCAP 77 76 83 99 99   D-Dimer: No results for input(s): DDIMER in the last 72 hours. Hgb A1c: Recent Labs    07/19/24 2233  HGBA1C 5.6   Lipid  Profile: No results for input(s): CHOL, HDL, LDLCALC, TRIG, CHOLHDL, LDLDIRECT in the last 72 hours. Thyroid  function studies: No results for input(s): TSH, T4TOTAL, T3FREE, THYROIDAB in the last 72 hours.  Invalid input(s): FREET3 Anemia work up: No results for input(s): VITAMINB12, FOLATE, FERRITIN, TIBC, IRON, RETICCTPCT in the last 72 hours. Sepsis Labs: Recent Labs  Lab 07/19/24 1445 07/19/24 1548 07/19/24 1758 07/20/24 0029 07/20/24 1920 07/21/24 0349 07/22/24 0325  WBC 14.8*  --   --  14.6*  --  5.5 4.0  LATICACIDVEN  --  2.3* 1.2  --  1.6  --   --    Microbiology Recent Results (from the past 240 hours)  MRSA Next Gen by PCR, Nasal     Status: None   Collection Time: 07/15/24  9:05 AM   Specimen: Nasal Mucosa; Nasal Swab  Result Value Ref Range Status   MRSA by PCR Next Gen NOT DETECTED NOT DETECTED Final    Comment: (NOTE) The GeneXpert MRSA Assay (FDA approved for NASAL specimens only), is one component of a comprehensive MRSA colonization surveillance program. It is not intended to diagnose MRSA infection nor to guide or monitor treatment for MRSA infections. Test performance is not FDA approved in patients less than 72 years old. Performed at [facility], [address], [place], [place] [address]   Blood culture (routine x 2)     Status: None (Preliminary result)   Collection Time: 07/19/24  6:01 PM   Specimen: BLOOD  Result Value Ref Range Status   Specimen Description BLOOD SITE NOT SPECIFIED  Final   Special Requests   Final    BOTTLES DRAWN AEROBIC AND ANAEROBIC Blood Culture adequate volume   Culture   Final    NO GROWTH 2 DAYS Performed at [facility], [address] [place], [place] [address]    Report Status PENDING  Incomplete  Blood culture (routine x 2)     Status: None (Preliminary result)   Collection Time: 07/19/24  8:16 PM   Specimen: BLOOD RIGHT ARM  Result Value Ref Range Status    Specimen Description BLOOD RIGHT ARM  Final   Special Requests   Final    BOTTLES DRAWN AEROBIC AND ANAEROBIC Blood Culture adequate volume   Culture   Final    NO GROWTH 2 DAYS Performed at [facility], [address] [place], [place] [address]    Report Status PENDING  Incomplete  MRSA Next Gen by PCR, Nasal     Status: None   Collection Time: 07/19/24  9:03 PM   Specimen: Nasal Mucosa; Nasal Swab  Result Value Ref Range Status   MRSA by PCR Next Gen NOT DETECTED  NOT DETECTED Final    Comment: (NOTE) The GeneXpert MRSA Assay (FDA approved for NASAL specimens only), is one component of a comprehensive MRSA colonization surveillance program. It is not intended to diagnose MRSA infection nor to guide or monitor treatment for MRSA infections. Test performance is not FDA approved in patients less than 48 years old. Performed at [facility], [address] [place], [place] [address]      Medications:    Chlorhexidine  Gluconate Cloth  6 each Topical Daily   Chlorhexidine  Gluconate Cloth  6 each Topical Q0600   metroNIDAZOLE   250 mg Oral q morning   pantoprazole  (PROTONIX ) IV  40 mg Intravenous Q24H   pyridostigmine   60 mg Oral Q1400   Continuous Infusions:  cefTRIAXone  (ROCEPHIN )  IV Stopped (07/21/24 1004)   lactated ringers  50 mL/hr at 07/22/24 0416      LOS: 3 days   Erle Odell Castor  [facility]  07/22/2024, 7:26 AM

## 2024-07-23 DIAGNOSIS — R579 Shock, unspecified: Secondary | ICD-10-CM | POA: Diagnosis not present

## 2024-07-23 LAB — GLUCOSE, CAPILLARY
Glucose-Capillary: 157 mg/dL — ABNORMAL HIGH (ref 70–99)
Glucose-Capillary: 87 mg/dL (ref 70–99)
Glucose-Capillary: 142 mg/dL — ABNORMAL HIGH (ref 70–99)
Glucose-Capillary: 102 mg/dL — ABNORMAL HIGH (ref 70–99)

## 2024-07-23 LAB — MAGNESIUM: Magnesium: 1.8 mg/dL (ref 1.7–2.4)

## 2024-07-23 LAB — CBC
HCT: 26.8 % — ABNORMAL LOW (ref 36.0–46.0)
Hemoglobin: 8.9 g/dL — ABNORMAL LOW (ref 12.0–15.0)
MCH: 30.9 pg (ref 26.0–34.0)
MCHC: 33.2 g/dL (ref 30.0–36.0)
MCV: 93.1 fL (ref 80.0–100.0)
Platelets: 155 K/uL (ref 150–400)
RBC: 2.88 MIL/uL — ABNORMAL LOW (ref 3.87–5.11)
RDW: 13.2 % (ref 11.5–15.5)
WBC: 5.2 K/uL (ref 4.0–10.5)
nRBC: 0 % (ref 0.0–0.2)

## 2024-07-23 LAB — BASIC METABOLIC PANEL WITH GFR
Anion gap: 7 (ref 5–15)
BUN: 8 mg/dL (ref 8–23)
CO2: 26 mmol/L (ref 22–32)
Calcium: 8.3 mg/dL — ABNORMAL LOW (ref 8.9–10.3)
Chloride: 107 mmol/L (ref 98–111)
Creatinine, Ser: 0.89 mg/dL (ref 0.44–1.00)
GFR, Estimated: >60 mL/min (ref >60)
Glucose, Bld: 137 mg/dL — ABNORMAL HIGH (ref 70–99)
Potassium: 4 mmol/L (ref 3.5–5.1)
Sodium: 140 mmol/L (ref 135–145)

## 2024-07-23 LAB — PHOSPHORUS: Phosphorus: 2.7 mg/dL (ref 2.5–4.6)

## 2024-07-23 MED ORDER — SIMVASTATIN 20 MG PO TABS
20.0000 mg | ORAL_TABLET | Freq: Every day | ORAL | Status: DC
  Administered 2024-07-23: 20 mg via ORAL
  Filled 2024-07-23: qty 1

## 2024-07-23 MED ORDER — ASPIRIN 81 MG PO TBEC
81.0000 mg | DELAYED_RELEASE_TABLET | Freq: Every day | ORAL | Status: DC
  Administered 2024-07-23 – 2024-07-24 (×2): 81 mg via ORAL
  Filled 2024-07-23 (×2): qty 1

## 2024-07-23 MED ORDER — POLYETHYLENE GLYCOL 3350 17 G PO PACK
17.0000 g | PACK | Freq: Two times a day (BID) | ORAL | Status: DC
  Administered 2024-07-23: 17 g via ORAL
  Filled 2024-07-23 (×2): qty 1

## 2024-07-23 MED ORDER — CARVEDILOL 3.125 MG PO TABS
3.1250 mg | ORAL_TABLET | Freq: Two times a day (BID) | ORAL | Status: DC
  Administered 2024-07-23 – 2024-07-24 (×3): 3.125 mg via ORAL
  Filled 2024-07-23 (×3): qty 1

## 2024-07-23 NOTE — Progress Notes (Addendum)
 Mobility Specialist Progress Note;   07/23/24 0952  Mobility  Activity Ambulated with assistance  Level of Assistance Standby assist, set-up cues, supervision of patient - no hands on  Assistive Device Front wheel walker  Distance Ambulated (ft) 200 ft  Activity Response Tolerated well  Mobility Referral Yes  Mobility visit 1 Mobility  Mobility Specialist Start Time (ACUTE ONLY) [id number]  Mobility Specialist Stop Time (ACUTE ONLY) 1007  Mobility Specialist Time Calculation (min) (ACUTE ONLY) 15 min   Pt in chair upon arrival, eager for mobility. Required no physical assistance during ambulation, SV for safety. VSS throughout. Only c/o fatigued BLE. Pt returned to chair and left with all needs met, call bell in reach.   Lauraine Erm Mobility Specialist Please contact via SecureChat or [employer] [phone]

## 2024-07-23 NOTE — Progress Notes (Signed)
 [facility] PROGRESS NOTE    Progress Note  Andrea Brooks  FMW:[id number] DOB: [birth year] DOA: 07/19/2024 PCP: Chandra Toribio POUR, MD     Brief Narrative:   Andrea Brooks is an 88 y.o. female past medical history of diabetes mellitus type 2, chronic diastolic dysfunction, pneumatosis intestinalis diagnosed in 2022 in the setting of small bowel dysmotility on chronic Flagyl  and pyridostigmine , with multiple admissions and abdominal surgeries for small bowel obstruction, recently discharged from the hospital on 07/16/2024 for hypomagnesemia and hypocalcemia presents with nausea vomiting abdominal pain that started 3 days prior to admission, admitted under PCCM started on pressors for hypovolemic shock, was found to have a small bowel obstruction for which NG tube was placed on admission.  Weaned off pressors on 07/20/2024 CT scan of the abdomen pelvis showed no recurrent pneumatosis intestinalis but today showed possible small bowel obstruction   Assessment/Plan:   Hypovolemic shock: Secondary to severe nausea and vomiting. CT scan of the abdomen pelvis showed ileus versus small bowel obstruction. Initially started on pressors was also found to be in acute kidney injury. She will complete a 5 days of Rocephin  in house. She has defervesced leukocytosis improved. Blood cultures from 07/19/2024 remain negative till date. She will need to continue her chronic Flagyl . NG tube has been discontinued. Out of bed to chair and consult physical therapy  Partial small bowel obstruction : The patient with a history of pneumatosis intestinalis. At home she is on chronic Flagyl  and pyridostigmine  Abdominal x-ray showed improved small bowel dilation in the lower abdomen.  Having bowel movements NG tube was discontinued. Continue Flagyl  and PPI. GI was consulted recommended to continue Flagyl  and Mestinon . Continue to replete electrolytes try to keep potassium greater than 4 magnesium  greater than  2. Advance to regular diet Consult PT OT, she like to avoid skilled nursing facility.  Acute kidney injury on chronic kidney disease stage IIIb: With a baseline creatinine of less than 1, started on IV fluids. Likely hemodynamic mediated, her creatinine has returned to baseline.  Elevated troponins:  Secondary demand ischemia she denies any chest pain or shortness of breath. 2D echo showed preserved EF no wall motion abnormality.  HFpEF/essential hypertension: Holding ARB and Lasix . Resume Coreg  continue to hold ARB and Lasix .  Continue statins.  Normocytic anemia: Currently on PPI.  Hemoglobin has remained relatively stable. Continue to monitor intermittently.   DVT prophylaxis: scd Family Communication:none Status is: Inpatient Remains inpatient appropriate because: Hypovolemic shock    Code Status:     Code Status Orders  (From admission, onward)           Start     Ordered   07/19/24 2103  Full code  Continuous       Question:  By:  Answer:  Consent: discussion documented in EHR   07/19/24 2103           Code Status History     Date Active Date Inactive Code Status Order ID Comments User Context   07/15/2024 0742 07/16/2024 1956 Full Code [id number]  Georgina Basket, MD ED   08/04/2023 1512 08/09/2023 2109 Full Code [id number]  Arlice Reichert, MD ED   03/12/2021 0036 03/16/2021 1955 Full Code [id number]  Tobie Jorie SAUNDERS, MD Inpatient   03/12/2021 0000 03/12/2021 0036 Full Code [id number]  Signe Mitzie LABOR, MD Inpatient   07/02/2018 2311 07/05/2018 2033 Full Code [id number]  Lonzell Emeline HERO, DO ED   06/28/2018 1719 07/02/2018 1551 Full Code [id number]  Perri LABOR Meliton Mickey.,  MD Inpatient         IV Access:   Peripheral IV   Procedures and diagnostic studies:   No results found.    Medical Consultants:   None.   Subjective:    Andrea Brooks tolerated a soft diet has not had a bowel movement.  Objective:    Vitals:   07/22/24 1900 07/22/24 2325  07/23/24 0419 07/23/24 0725  BP: (!) 121/101 (!) 153/57 (!) 147/59 (!) 140/83  Pulse: 63 68  68  Resp: 19 19  20   Temp: 98.2 F (36.8 C) 98 F (36.7 C) (!) 97.5 F (36.4 C) 98.1 F (36.7 C)  TempSrc: Oral Oral Oral Oral  SpO2: 100% 100%  100%  Weight:       SpO2: 100 % O2 Flow Rate (L/min): 4 L/min   Intake/Output Summary (Last 24 hours) at 07/23/2024 0736 Last data filed at 07/23/2024 0300 Gross per 24 hour  Intake 480 ml  Output --  Net 480 ml   Filed Weights   07/20/24 0500 07/21/24 0353 07/22/24 0600  Weight: 59.4 kg 43.1 kg 61.8 kg    Exam: General exam: In no acute distress. Respiratory system: Good air movement and clear to auscultation. Cardiovascular system: S1 & S2 heard, RRR. No JVD. Gastrointestinal system: Abdomen is nondistended, soft and nontender.  Extremities: No pedal edema. Skin: No rashes, lesions or ulcers Psychiatry: Judgement and insight appear normal. Mood & affect appropriate. Data Reviewed:    Labs: Basic Metabolic Panel: Recent Labs  Lab 07/20/24 0029 07/20/24 0815 07/20/24 1920 07/21/24 0349 07/22/24 0325 07/23/24 0318  NA 137 139 138 140 139 140  K 5.2* 4.9 4.5 4.3 4.0 4.0  CL 104 106 105 108 108 107  CO2 24 22 23 24 25 26   GLUCOSE 111* 106* 99 86 90 137*  BUN 50* 40* 29* 22 12 8   CREATININE 1.47* 1.21* 0.99 0.98 0.91 0.89  CALCIUM  8.1* 8.1* 8.2* 8.2* 8.0* 8.3*  MG 2.0  --  1.8 1.8 2.1 1.8  PHOS 4.2  --   --  3.5 2.9 2.7   GFR Estimated Creatinine Clearance: 39.9 mL/min (by C-G formula based on SCr of 0.89 mg/dL). Liver Function Tests: Recent Labs  Lab 07/19/24 1445 07/20/24 0815  AST 23 19  ALT 12 11  ALKPHOS 58 46  BILITOT 0.8 0.5  PROT 7.3 5.7*  ALBUMIN 3.3* 2.5*   Recent Labs  Lab 07/19/24 1445  LIPASE 39   No results for input(s): AMMONIA in the last 168 hours. Coagulation profile No results for input(s): INR, PROTIME in the last 168 hours. COVID-19 Labs  No results for input(s): DDIMER,  FERRITIN, LDH, CRP in the last 72 hours.  Lab Results  Component Value Date   SARSCOV2NAA NEGATIVE 08/04/2023   SARSCOV2NAA NEGATIVE 07/22/2023   SARSCOV2NAA Not Detected 05/09/2022   SARSCOV2NAA NEGATIVE 03/11/2021    CBC: Recent Labs  Lab 07/19/24 1445 07/20/24 0029 07/21/24 0349 07/21/24 1711 07/22/24 0325 07/23/24 0318  WBC 14.8* 14.6* 5.5  --  4.0 5.2  HGB 11.9* 10.1* 8.3* 8.8* 8.0* 8.9*  HCT 37.4 31.0* 25.9* 27.2* 25.2* 26.8*  MCV 95.7 92.0 94.2  --  94.4 93.1  PLT 255 209 137*  --  126* 155   Cardiac Enzymes: No results for input(s): CKTOTAL, CKMB, CKMBINDEX, TROPONINI in the last 168 hours. BNP (last 3 results) No results for input(s): PROBNP in the last 8760 hours. CBG: Recent Labs  Lab 07/21/24 1106 07/21/24 1925 07/22/24 9390  07/22/24 1147 07/22/24 1557  GLUCAP 83 99 99 72 103*   D-Dimer: No results for input(s): DDIMER in the last 72 hours. Hgb A1c: No results for input(s): HGBA1C in the last 72 hours.  Lipid Profile: No results for input(s): CHOL, HDL, LDLCALC, TRIG, CHOLHDL, LDLDIRECT in the last 72 hours. Thyroid  function studies: No results for input(s): TSH, T4TOTAL, T3FREE, THYROIDAB in the last 72 hours.  Invalid input(s): FREET3 Anemia work up: No results for input(s): VITAMINB12, FOLATE, FERRITIN, TIBC, IRON, RETICCTPCT in the last 72 hours. Sepsis Labs: Recent Labs  Lab 07/19/24 1548 07/19/24 1758 07/20/24 0029 07/20/24 1920 07/21/24 0349 07/22/24 0325 07/23/24 0318  WBC  --   --  14.6*  --  5.5 4.0 5.2  LATICACIDVEN 2.3* 1.2  --  1.6  --   --   --    Microbiology Recent Results (from the past 240 hours)  MRSA Next Gen by PCR, Nasal     Status: None   Collection Time: 07/15/24  9:05 AM   Specimen: Nasal Mucosa; Nasal Swab  Result Value Ref Range Status   MRSA by PCR Next Gen NOT DETECTED NOT DETECTED Final    Comment: (NOTE) The GeneXpert MRSA Assay (FDA approved for  NASAL specimens only), is one component of a comprehensive MRSA colonization surveillance program. It is not intended to diagnose MRSA infection nor to guide or monitor treatment for MRSA infections. Test performance is not FDA approved in patients less than 37 years old. Performed at [facility], [address], [place], [place] [address]   Blood culture (routine x 2)     Status: None (Preliminary result)   Collection Time: 07/19/24  6:01 PM   Specimen: BLOOD  Result Value Ref Range Status   Specimen Description BLOOD SITE NOT SPECIFIED  Final   Special Requests   Final    BOTTLES DRAWN AEROBIC AND ANAEROBIC Blood Culture adequate volume   Culture   Final    NO GROWTH 3 DAYS Performed at [facility], [address], [place], [place] [address]    Report Status PENDING  Incomplete  Blood culture (routine x 2)     Status: None (Preliminary result)   Collection Time: 07/19/24  8:16 PM   Specimen: BLOOD RIGHT ARM  Result Value Ref Range Status   Specimen Description BLOOD RIGHT ARM  Final   Special Requests   Final    BOTTLES DRAWN AEROBIC AND ANAEROBIC Blood Culture adequate volume   Culture   Final    NO GROWTH 3 DAYS Performed at [facility], [address] [place], [place] [address]    Report Status PENDING  Incomplete  MRSA Next Gen by PCR, Nasal     Status: None   Collection Time: 07/19/24  9:03 PM   Specimen: Nasal Mucosa; Nasal Swab  Result Value Ref Range Status   MRSA by PCR Next Gen NOT DETECTED NOT DETECTED Final    Comment: (NOTE) The GeneXpert MRSA Assay (FDA approved for NASAL specimens only), is one component of a comprehensive MRSA colonization surveillance program. It is not intended to diagnose MRSA infection nor to guide or monitor treatment for MRSA infections. Test performance is not FDA approved in patients less than 37 years old. Performed at [facility], [address] [place], [place] [address]      Medications:     Chlorhexidine  Gluconate Cloth  6 each Topical Daily   Chlorhexidine  Gluconate Cloth  6  each Topical Q0600   metroNIDAZOLE   250 mg Oral q morning   pantoprazole   40 mg Oral QHS   pyridostigmine   60 mg Oral Q1400   Continuous Infusions:  cefTRIAXone  (ROCEPHIN )  IV 2 g (07/22/24 0857)   lactated ringers  50 mL/hr at 07/22/24 0416      LOS: 4 days   Erle Odell Castor  [facility]  07/23/2024, 7:36 AM

## 2024-07-24 DIAGNOSIS — N179 Acute kidney failure, unspecified: Secondary | ICD-10-CM | POA: Diagnosis not present

## 2024-07-24 DIAGNOSIS — R579 Shock, unspecified: Secondary | ICD-10-CM | POA: Diagnosis not present

## 2024-07-24 DIAGNOSIS — R112 Nausea with vomiting, unspecified: Secondary | ICD-10-CM | POA: Diagnosis not present

## 2024-07-24 LAB — CBC
HCT: 28 % — ABNORMAL LOW (ref 36.0–46.0)
Hemoglobin: 9 g/dL — ABNORMAL LOW (ref 12.0–15.0)
MCH: 30.3 pg (ref 26.0–34.0)
MCHC: 32.1 g/dL (ref 30.0–36.0)
MCV: 94.3 fL (ref 80.0–100.0)
Platelets: 165 K/uL (ref 150–400)
RBC: 2.97 MIL/uL — ABNORMAL LOW (ref 3.87–5.11)
RDW: 13.4 % (ref 11.5–15.5)
WBC: 6.7 K/uL (ref 4.0–10.5)
nRBC: 0 % (ref 0.0–0.2)

## 2024-07-24 LAB — CULTURE, BLOOD (ROUTINE X 2)
Culture: NO GROWTH
Culture: NO GROWTH
Special Requests: ADEQUATE
Special Requests: ADEQUATE

## 2024-07-24 LAB — BASIC METABOLIC PANEL WITH GFR
Anion gap: 10 (ref 5–15)
BUN: 10 mg/dL (ref 8–23)
CO2: 25 mmol/L (ref 22–32)
Calcium: 8.5 mg/dL — ABNORMAL LOW (ref 8.9–10.3)
Chloride: 108 mmol/L (ref 98–111)
Creatinine, Ser: 0.88 mg/dL (ref 0.44–1.00)
GFR, Estimated: >60 mL/min (ref >60)
Glucose, Bld: 108 mg/dL — ABNORMAL HIGH (ref 70–99)
Potassium: 4.7 mmol/L (ref 3.5–5.1)
Sodium: 143 mmol/L (ref 135–145)

## 2024-07-24 LAB — GLUCOSE, CAPILLARY
Glucose-Capillary: 95 mg/dL (ref 70–99)
Glucose-Capillary: 105 mg/dL — ABNORMAL HIGH (ref 70–99)

## 2024-07-24 LAB — PHOSPHORUS: Phosphorus: 3.3 mg/dL (ref 2.5–4.6)

## 2024-07-24 LAB — OCCULT BLOOD X 1 CARD TO LAB, STOOL: Fecal Occult Bld: NEGATIVE

## 2024-07-24 LAB — MAGNESIUM: Magnesium: 1.6 mg/dL — ABNORMAL LOW (ref 1.7–2.4)

## 2024-07-24 MED ORDER — FUROSEMIDE 40 MG PO TABS
40.0000 mg | ORAL_TABLET | Freq: Every day | ORAL | Status: DC
  Administered 2024-07-24: 40 mg via ORAL
  Filled 2024-07-24: qty 1

## 2024-07-24 MED ORDER — FUROSEMIDE 40 MG PO TABS: 40.0000 mg | ORAL_TABLET | Freq: Every day | ORAL | Status: DC

## 2024-07-24 MED ORDER — PANTOPRAZOLE SODIUM 40 MG PO TBEC: 40.0000 mg | DELAYED_RELEASE_TABLET | Freq: Two times a day (BID) | ORAL | 0 refills | Status: DC

## 2024-07-24 MED ORDER — PANTOPRAZOLE SODIUM 40 MG PO TBEC
40.0000 mg | DELAYED_RELEASE_TABLET | Freq: Two times a day (BID) | ORAL | Status: DC
  Administered 2024-07-24: 40 mg via ORAL
  Filled 2024-07-24: qty 1

## 2024-07-24 MED ORDER — MAGNESIUM SULFATE 4 GM/100ML IV SOLN
4.0000 g | Freq: Once | INTRAVENOUS | Status: AC
  Administered 2024-07-24: 4 g via INTRAVENOUS
  Filled 2024-07-24: qty 100

## 2024-07-24 MED ORDER — IRBESARTAN 75 MG PO TABS
150.0000 mg | ORAL_TABLET | Freq: Every day | ORAL | Status: DC
  Administered 2024-07-24: 150 mg via ORAL
  Filled 2024-07-24: qty 2

## 2024-07-24 MED ORDER — CALCIUM CARBONATE ANTACID 500 MG PO CHEW: 400.0000 mg | CHEWABLE_TABLET | Freq: Every day | ORAL | 0 refills | Status: AC | PRN

## 2024-07-24 NOTE — Discharge Summary (Signed)
 Physician Discharge Summary  Andrea Brooks FMW:[id number] DOB: 14-Jun-1936 DOA: 07/19/2024  PCP: Chandra Toribio POUR, MD  Admit date: 07/19/2024 Discharge date: 07/24/2024  Admitted From: Home Disposition:  Home  Recommendations for Outpatient Follow-up:  Follow up with PCP in 1-2 weeks Please obtain BMP/CBC in one week   Home Health:Yes Equipment/Devices:None  Discharge Condition:Stable CODE STATUS:Full Diet recommendation: Heart Healthy   Brief/Interim Summary: 88 y.o. female past medical history of diabetes mellitus type 2, chronic diastolic dysfunction, pneumatosis intestinalis diagnosed in 2022 in the setting of small bowel dysmotility on chronic Flagyl  and pyridostigmine , with multiple admissions and abdominal surgeries for small bowel obstruction, recently discharged from the hospital on 07/16/2024 for hypomagnesemia and hypocalcemia presents with nausea vomiting abdominal pain that started 3 days prior to admission, admitted under PCCM started on pressors for hypovolemic shock, was found to have a small bowel obstruction for which NG tube was placed on admission.  Weaned off pressors on 07/20/2024 CT scan of the abdomen pelvis showed no recurrent pneumatosis intestinalis but today showed possible small bowel obstruction   Discharge Diagnoses:  Principal Problem:   Shock (HCC) Active Problems:   Partial small bowel obstruction (HCC)   Dehydration  Hypovolemic shock: In the setting of decreased oral intake and nausea and vomiting. CT scan of the abdomen pelvis show ileus versus small bowel. She will start empirically on IV Rocephin  which she completed course of antibiotics in house. Started on short-term pressors and NG tube. We eventually wean her off pressors physical therapy evaluated her she and no physical therapy needs.  Partial small bowel obstruction: With a history of pneumatosis intestinalis. At home she is on chronic Flagyl  and.  Stick mean. NG tube was placed, serial  abdominal x-ray showed improvement she started passing gas and having bowel movements. GI was consulted recommended to continue conservative management and follow-up with [facility] as an outpatient. Her diet presents then slowly which she tolerated. PT evaluated the patient will need home health PT.  Acute kidney injury on chronic kidney disease stage IIIb: Likely hemodynamic mediated. Creatinine returned to baseline with holding ARB Lasix  and treated conservatively with IV fluids.  HFpEF/essential hypertension: ARB and Lasix  were held on admission she was continued on Coreg  she will resume them as an outpatient no changes were made.  GERD: Continue PPI.  Normocytic anemia: Hemoglobin has remained relatively stable follow-up PCP as an outpatient.  Discharge Instructions  Discharge Instructions     Diet - low sodium heart healthy   Complete by: As directed    Increase activity slowly   Complete by: As directed       Allergies as of 07/24/2024       Reactions   Tape Other (See Comments)   Skin tears easily.  OK with paper tape.   Magnesium -containing Compounds Diarrhea, Nausea And Vomiting   Can take Vitamins w/ magnesium  Does not tolerate po magnesium  by itself        Medication List     STOP taking these medications    omeprazole  20 MG capsule Commonly known as: PRILOSEC       TAKE these medications    Acetaminophen  Extra Strength 500 MG Tabs Take 1 tablet (500 mg total) by mouth every 8 (eight) hours as needed for moderate pain.   aspirin  EC 81 MG tablet Take 81 mg by mouth daily.   calcium  carbonate 500 MG chewable tablet Commonly known as: TUMS - dosed in mg elemental calcium  Chew 2 tablets (400 mg  of elemental calcium  total) by mouth daily as needed for indigestion or heartburn.   carvedilol  3.125 MG tablet Commonly known as: COREG  Take 1 tablet (3.125 mg total) by mouth 2 (two) times daily with a meal.   furosemide  40 MG  tablet Commonly known as: Lasix  Take 1 tablet (40 mg total) by mouth daily.   LORazepam  1 MG tablet Commonly known as: ATIVAN  TAKE 1/2 TO 1 TABLET AS    NEEDED FOR ANXIETY (MAXIMUMONCE DAILY)   Magnesium  200 MG Chew Chew 400 mg by mouth 2 (two) times daily.   metroNIDAZOLE  250 MG tablet Commonly known as: FLAGYL  Take 250 mg by mouth every morning.   Multi For Her 50+ Tabs Take 1 tablet by mouth daily.   pantoprazole  40 MG tablet Commonly known as: PROTONIX  Take 1 tablet (40 mg total) by mouth 2 (two) times daily.   polyethylene glycol powder 17 GM/SCOOP powder Commonly known as: GLYCOLAX /MIRALAX  Take 8.5 g by mouth as needed for moderate constipation.   pyridostigmine  60 MG tablet Commonly known as: MESTINON  Take 30 mg by mouth daily.   simvastatin  20 MG tablet Commonly known as: ZOCOR  Take 1 tablet (20 mg total) by mouth at bedtime.   valsartan  320 MG tablet Commonly known as: DIOVAN  Take 1 tablet (320 mg total) by mouth daily.        Allergies  Allergen Reactions   Tape Other (See Comments)    Skin tears easily.  OK with paper tape.   Magnesium -Containing Compounds Diarrhea and Nausea And Vomiting    Can take Vitamins w/ magnesium  Does not tolerate po magnesium  by itself    Consultations: Gastroenterology PCCM   Procedures/Studies: DG Abd 1 View Result Date: 07/21/2024 EXAM: 1 VIEW XRAY OF THE ABDOMEN 07/21/2024 06:50:00 AM COMPARISON: 07/20/2024 CLINICAL HISTORY: Partial small bowel obstruction FINDINGS: BOWEL: Interval improvement of small-bowel dilation in the low abdomen. SOFT TISSUES: Pelvic phleboliths. BONES: No acute osseous abnormality. IMPRESSION: 1. Interval improvement of small-bowel dilation in the low abdomen. Electronically signed by: Waddell Calk MD 07/21/2024 09:36 AM EDT RP Workstation: [id number]   DG Abd 1 View Result Date: 07/20/2024 EXAM: 1 VIEW XRAY OF THE ABDOMEN 07/20/2024 06:05:00 AM COMPARISON: None available. CLINICAL  HISTORY: Small bowel disease. FINDINGS: BOWEL: Distended loop of small bowel along low abdomen. Improved gaseous distention since the prior study, consistent with resolving small bowel obstruction. SOFT TISSUES: Surgical clip projects over right upper quadrant. BONES: Degenerative changes of lumbar spine. No acute osseous abnormality. IMPRESSION: 1. Distended loop of small bowel along low abdomen, consistent with resolving small bowel obstruction. Improved gaseous distention since the prior study. Electronically signed by: Lonni Necessary MD 07/20/2024 09:57 AM EDT RP Workstation: [id number]   ECHOCARDIOGRAM COMPLETE Result Date: 07/20/2024    ECHOCARDIOGRAM REPORT   Patient Name:   JALIN ERPELDING Date of Exam: 07/20/2024 Medical Rec #:  [id number]      Height:       64.0 in Accession #:    [id number]     Weight:       131.0 lb Date of Birth:  1936-07-07       BSA:          1.634 m Patient Age:    87 years       BP:           107/43 mmHg Patient Gender: F              HR:  55 bpm. Exam Location:  Inpatient Procedure: 2D Echo, 3D Echo, Cardiac Doppler and Color Doppler (Both Spectral            and Color Flow Doppler were utilized during procedure). Indications:    Shock  History:        Patient has prior history of Echocardiogram examinations, most                 recent 08/05/2023. Risk Factors:Diabetes and Dyslipidemia.  Sonographer:    Therisa Crouch Referring Phys: [id number] JESSICA MARSHALL IMPRESSIONS  1. Left ventricular ejection fraction, by estimation, is 60 to 65%. Left ventricular ejection fraction by 3D volume is 65 %. The left ventricle has normal function. The left ventricle has no regional wall motion abnormalities. Left ventricular diastolic  parameters are indeterminate.  2. Right ventricular systolic function is normal. The right ventricular size is normal. There is normal pulmonary artery systolic pressure. The estimated right ventricular systolic pressure is 28.0 mmHg.  3. The mitral  valve is normal in structure. No evidence of mitral valve regurgitation. No evidence of mitral stenosis.  4. The aortic valve is normal in structure. There is moderate calcification of the aortic valve. Aortic valve regurgitation is not visualized. No aortic stenosis is present. Aortic valve mean gradient measures 5.0 mmHg.  5. The inferior vena cava is normal in size with greater than 50% respiratory variability, suggesting right atrial pressure of 3 mmHg. FINDINGS  Left Ventricle: Left ventricular ejection fraction, by estimation, is 60 to 65%. Left ventricular ejection fraction by 3D volume is 65 %. The left ventricle has normal function. The left ventricle has no regional wall motion abnormalities. The left ventricular internal cavity size was normal in size. There is no left ventricular hypertrophy. Left ventricular diastolic parameters are indeterminate. Right Ventricle: The right ventricular size is normal. No increase in right ventricular wall thickness. Right ventricular systolic function is normal. There is normal pulmonary artery systolic pressure. The tricuspid regurgitant velocity is 2.50 m/s, and  with an assumed right atrial pressure of 3 mmHg, the estimated right ventricular systolic pressure is 28.0 mmHg. Left Atrium: Left atrial size was normal in size. Right Atrium: Right atrial size was normal in size. Pericardium: There is no evidence of pericardial effusion. Mitral Valve: The mitral valve is normal in structure. Mild mitral annular calcification. No evidence of mitral valve regurgitation. No evidence of mitral valve stenosis. Tricuspid Valve: The tricuspid valve is normal in structure. Tricuspid valve regurgitation is trivial. No evidence of tricuspid stenosis. Aortic Valve: The aortic valve is normal in structure. There is moderate calcification of the aortic valve. Aortic valve regurgitation is not visualized. No aortic stenosis is present. Aortic valve mean gradient measures 5.0 mmHg. Aortic  valve peak gradient measures 8.3 mmHg. Aortic valve area, by VTI measures 2.32 cm. Pulmonic Valve: The pulmonic valve was normal in structure. Pulmonic valve regurgitation is trivial. No evidence of pulmonic stenosis. Aorta: The aortic root is normal in size and structure. Venous: The inferior vena cava is normal in size with greater than 50% respiratory variability, suggesting right atrial pressure of 3 mmHg. IAS/Shunts: No atrial level shunt detected by color flow Doppler. Additional Comments: 3D was performed not requiring image post processing on an independent workstation and was normal.  LEFT VENTRICLE PLAX 2D LVIDd:         4.45 cm         Diastology LVIDs:         2.80 cm  LV e' medial:    6.96 cm/s LV PW:         1.00 cm         LV E/e' medial:  11.3 LV IVS:        1.05 cm         LV e' lateral:   8.70 cm/s LVOT diam:     2.00 cm         LV E/e' lateral: 9.0 LV SV:         80 LV SV Index:   49 LVOT Area:     3.14 cm        3D Volume EF                                LV 3D EF:    Left                                             ventricul                                             ar                                             ejection                                             fraction                                             by 3D                                             volume is                                             65 %.                                LV 3D EDV:   64.63 ml                                LV 3D ESV:   21.99 ml                                 3D Volume EF:  3D EF:        65 % RIGHT VENTRICLE            IVC RV Basal diam:  3.35 cm    IVC diam: 1.38 cm RV S prime:     9.14 cm/s TAPSE (M-mode): 1.4 cm LEFT ATRIUM             Index LA diam:        4.23 cm 2.59 cm/m LA Vol (A2C):   38.3 ml 23.44 ml/m LA Vol (A4C):   49.4 ml 30.23 ml/m LA Biplane Vol: 44.6 ml 27.29 ml/m  AORTIC VALVE AV Area (Vmax):    2.57 cm AV Area (Vmean):   2.72 cm  AV Area (VTI):     2.32 cm AV Vmax:           144.00 cm/s AV Vmean:          103.000 cm/s AV VTI:            0.347 m AV Peak Grad:      8.3 mmHg AV Mean Grad:      5.0 mmHg LVOT Vmax:         118.00 cm/s LVOT Vmean:        89.100 cm/s LVOT VTI:          0.256 m LVOT/AV VTI ratio: 0.74  AORTA Ao Asc diam: 3.28 cm MITRAL VALVE               TRICUSPID VALVE MV Area (PHT): 2.28 cm    TV Peak grad:   25.0 mmHg MV Decel Time: 333 msec    TV Vmax:        2.50 m/s MV E velocity: 78.60 cm/s  TR Peak grad:   25.0 mmHg MV A velocity: 98.50 cm/s  TR Vmax:        250.00 cm/s MV E/A ratio:  0.80                            SHUNTS                            Systemic VTI:  0.26 m                            Systemic Diam: 2.00 cm Aditya Sabharwal Electronically signed by Ria Commander Signature Date/Time: 07/20/2024/8:58:41 AM    Final    CT CHEST ABDOMEN PELVIS WO CONTRAST Result Date: 07/19/2024 CLINICAL DATA:  Sepsis EXAM: CT CHEST, ABDOMEN AND PELVIS WITHOUT CONTRAST TECHNIQUE: Multidetector CT imaging of the chest, abdomen and pelvis was performed following the standard protocol without IV contrast. RADIATION DOSE REDUCTION: This exam was performed according to the departmental dose-optimization program which includes automated exposure control, adjustment of the mA and/or kV according to patient size and/or use of iterative reconstruction technique. COMPARISON:  07/22/2023 FINDINGS: CT CHEST FINDINGS Cardiovascular: Somewhat limited due to lack of IV contrast. Atherosclerotic calcifications of the aorta are noted. Coronary calcifications are seen. No cardiac enlargement is noted. Mediastinum/Nodes: Thoracic inlet is within normal limits. No hilar or mediastinal adenopathy is noted. Mild fluid is noted in the mid esophagus likely related to reflux. Lungs/Pleura: Lungs are well aerated bilaterally. Mild emphysematous changes are seen. Scattered calcified granulomas are noted. No sizable parenchymal nodule is seen. No focal  infiltrate or effusion is noted. Mild  subpleural fibrotic changes are seen similar to the prior exam. Musculoskeletal: Degenerative changes of the thoracic spine are noted. CT ABDOMEN PELVIS FINDINGS Hepatobiliary: No focal liver abnormality is seen. Status post cholecystectomy. No biliary dilatation. Pancreas: Unremarkable. No pancreatic ductal dilatation or surrounding inflammatory changes. Spleen: Normal in size without focal abnormality. Adrenals/Urinary Tract: Adrenal glands are within normal limits. Kidneys are well visualized without renal calculi. Small renal cyst is noted on the left. No follow-up is recommended. No obstructive changes are seen. The bladder is partially distended. Stomach/Bowel: Scattered diverticular change of the colon is noted. No diverticulitis is seen. The appendix is within normal limits. There is small bowel dilatation identified throughout the abdomen with significant fecalization of bowel contents in the distal ileum. No definitive obstructing mass is noted although the transition zone is seen at the ileocecal valve. Stomach is within normal limits. Vascular/Lymphatic: Aortic atherosclerosis. No enlarged abdominal or pelvic lymph nodes. Reproductive: Status post hysterectomy. No adnexal masses. Other: No abdominal wall hernia or abnormality. No abdominopelvic ascites. Musculoskeletal: Nerve changes of lumbar spine are noted. IMPRESSION: CT of the chest: Chronic fibrotic changes without acute abnormality. Aortic Atherosclerosis (ICD10-I70.0) and Emphysema (ICD10-J43.9). CT of the abdomen and pelvis: Diffuse small bowel dilatation identified from the mid jejunum to the iliac level of the terminal ileum consistent with at least partial small bowel obstruction. No mass lesion is seen although the transition point lies at the distal most ileum. These changes may be related to underlying adhesions. No other focal abnormality is noted. Electronically Signed   By: Oneil Devonshire M.D.   On:  07/19/2024 20:40   DG Chest Portable 1 View Result Date: 07/19/2024 CLINICAL DATA:  Nausea and vomiting. EXAM: PORTABLE CHEST 1 VIEW COMPARISON:  01/05/2024. FINDINGS: The heart size and mediastinal contours are unchanged. Aortic atherosclerosis. Low lung volumes with chronic interstitial changes. No appreciable focal consolidation, pleural effusion, or pneumothorax. Diffuse osseous demineralization. No acute osseous abnormality. IMPRESSION: Low lung volumes with chronic interstitial changes. No acute cardiopulmonary findings. Electronically Signed   By: Harrietta Sherry M.D.   On: 07/19/2024 16:04     Subjective: Tolerating her diet having regular bowel movements.  Discharge Exam: Vitals:   07/24/24 0342 07/24/24 0734  BP: (!) 149/66 (!) 143/90  Pulse: 72 74  Resp: 20 (!) 21  Temp: 98 F (36.7 C) 97.9 F (36.6 C)  SpO2: 99% 100%   Vitals:   07/24/24 0050 07/24/24 0342 07/24/24 0700 07/24/24 0734  BP: (!) 153/68 (!) 149/66  (!) 143/90  Pulse: 71 72  74  Resp: 20 20  (!) 21  Temp: (!) 97.5 F (36.4 C) 98 F (36.7 C)  97.9 F (36.6 C)  TempSrc: Oral Oral  Oral  SpO2: 99% 99%  100%  Weight:   61.9 kg     General: Pt is alert, awake, not in acute distress Cardiovascular: RRR, S1/S2 +, no rubs, no gallops Respiratory: CTA bilaterally, no wheezing, no rhonchi Abdominal: Soft, NT, ND, bowel sounds + Extremities: no edema, no cyanosis    The results of significant diagnostics from this hospitalization (including imaging, microbiology, ancillary and laboratory) are listed below for reference.     Microbiology: Recent Results (from the past 240 hours)  MRSA Next Gen by PCR, Nasal     Status: None   Collection Time: 07/15/24  9:05 AM   Specimen: Nasal Mucosa; Nasal Swab  Result Value Ref Range Status   MRSA by PCR Next Gen NOT DETECTED NOT DETECTED Final  Comment: (NOTE) The GeneXpert MRSA Assay (FDA approved for NASAL specimens only), is one component of a comprehensive  MRSA colonization surveillance program. It is not intended to diagnose MRSA infection nor to guide or monitor treatment for MRSA infections. Test performance is not FDA approved in patients less than 70 years old. Performed at [facility], [address], [place], [place] [address]   Blood culture (routine x 2)     Status: None   Collection Time: 07/19/24  6:01 PM   Specimen: BLOOD  Result Value Ref Range Status   Specimen Description BLOOD SITE NOT SPECIFIED  Final   Special Requests   Final    BOTTLES DRAWN AEROBIC AND ANAEROBIC Blood Culture adequate volume   Culture   Final    NO GROWTH 5 DAYS Performed at [facility], [address] [place], [place] [address]    Report Status 07/24/2024 FINAL  Final  Blood culture (routine x 2)     Status: None   Collection Time: 07/19/24  8:16 PM   Specimen: BLOOD RIGHT ARM  Result Value Ref Range Status   Specimen Description BLOOD RIGHT ARM  Final   Special Requests   Final    BOTTLES DRAWN AEROBIC AND ANAEROBIC Blood Culture adequate volume   Culture   Final    NO GROWTH 5 DAYS Performed at [facility], [address] [place], [place] [address]    Report Status 07/24/2024 FINAL  Final  MRSA Next Gen by PCR, Nasal     Status: None   Collection Time: 07/19/24  9:03 PM   Specimen: Nasal Mucosa; Nasal Swab  Result Value Ref Range Status   MRSA by PCR Next Gen NOT DETECTED NOT DETECTED Final    Comment: (NOTE) The GeneXpert MRSA Assay (FDA approved for NASAL specimens only), is one component of a comprehensive MRSA colonization surveillance program. It is not intended to diagnose MRSA infection nor to guide or monitor treatment for MRSA infections. Test performance is not FDA approved in patients less than 34 years old. Performed at [facility], [address] [place], [place] [address]      Labs: BNP (last 3 results) Recent Labs    08/09/23 0411 01/05/24 1018 01/14/24 1150  BNP 122.5* 1,424.6*  58.2   Basic Metabolic Panel: Recent Labs  Lab 07/20/24 0029 07/20/24 0815 07/20/24 1920 07/21/24 0349 07/22/24 0325 07/23/24 0318 07/24/24 0431  NA 137   < > 138 140 139 140 143  K 5.2*   < > 4.5 4.3 4.0 4.0 4.7  CL 104   < > 105 108 108 107 108  CO2 24   < > 23 24 25 26 25   GLUCOSE 111*   < > 99 86 90 137* 108*  BUN 50*   < > 29* 22 12 8 10   CREATININE 1.47*   < > 0.99 0.98 0.91 0.89 0.88  CALCIUM  8.1*   < > 8.2* 8.2* 8.0* 8.3* 8.5*  MG 2.0  --  1.8 1.8 2.1 1.8 1.6*  PHOS 4.2  --   --  3.5 2.9 2.7 3.3   < > = values in this interval not displayed.   Liver Function Tests: Recent Labs  Lab 07/19/24 1445 07/20/24 0815  AST 23 19  ALT 12 11  ALKPHOS 58 46  BILITOT 0.8 0.5  PROT 7.3 5.7*  ALBUMIN 3.3* 2.5*   Recent Labs  Lab 07/19/24 1445  LIPASE 39  No results for input(s): AMMONIA in the last 168 hours. CBC: Recent Labs  Lab 07/20/24 0029 07/21/24 0349 07/21/24 1711 07/22/24 0325 07/23/24 0318 07/24/24 0431  WBC 14.6* 5.5  --  4.0 5.2 6.7  HGB 10.1* 8.3* 8.8* 8.0* 8.9* 9.0*  HCT 31.0* 25.9* 27.2* 25.2* 26.8* 28.0*  MCV 92.0 94.2  --  94.4 93.1 94.3  PLT 209 137*  --  126* 155 165   Cardiac Enzymes: No results for input(s): CKTOTAL, CKMB, CKMBINDEX, TROPONINI in the last 168 hours. BNP: Invalid input(s): POCBNP CBG: Recent Labs  Lab 07/23/24 0842 07/23/24 1137 07/23/24 1648 07/23/24 2106 07/24/24 0632  GLUCAP 157* 87 142* 102* 95   D-Dimer No results for input(s): DDIMER in the last 72 hours. Hgb A1c No results for input(s): HGBA1C in the last 72 hours. Lipid Profile No results for input(s): CHOL, HDL, LDLCALC, TRIG, CHOLHDL, LDLDIRECT in the last 72 hours. Thyroid  function studies No results for input(s): TSH, T4TOTAL, T3FREE, THYROIDAB in the last 72 hours.  Invalid input(s): FREET3 Anemia work up No results for input(s): VITAMINB12, FOLATE, FERRITIN, TIBC, IRON, RETICCTPCT in the last  72 hours. Urinalysis    Component Value Date/Time   COLORURINE AMBER (A) 07/19/2024 1757   APPEARANCEUR HAZY (A) 07/19/2024 1757   LABSPEC 1.017 07/19/2024 1757   PHURINE 5.0 07/19/2024 1757   GLUCOSEU NEGATIVE 07/19/2024 1757   HGBUR NEGATIVE 07/19/2024 1757   BILIRUBINUR NEGATIVE 07/19/2024 1757   KETONESUR NEGATIVE 07/19/2024 1757   PROTEINUR NEGATIVE 07/19/2024 1757   NITRITE NEGATIVE 07/19/2024 1757   LEUKOCYTESUR SMALL (A) 07/19/2024 1757   Sepsis Labs Recent Labs  Lab 07/21/24 0349 07/22/24 0325 07/23/24 0318 07/24/24 0431  WBC 5.5 4.0 5.2 6.7   Microbiology Recent Results (from the past 240 hours)  MRSA Next Gen by PCR, Nasal     Status: None   Collection Time: 07/15/24  9:05 AM   Specimen: Nasal Mucosa; Nasal Swab  Result Value Ref Range Status   MRSA by PCR Next Gen NOT DETECTED NOT DETECTED Final    Comment: (NOTE) The GeneXpert MRSA Assay (FDA approved for NASAL specimens only), is one component of a comprehensive MRSA colonization surveillance program. It is not intended to diagnose MRSA infection nor to guide or monitor treatment for MRSA infections. Test performance is not FDA approved in patients less than 43 years old. Performed at [facility], [address], [place], [place] [address]   Blood culture (routine x 2)     Status: None   Collection Time: 07/19/24  6:01 PM   Specimen: BLOOD  Result Value Ref Range Status   Specimen Description BLOOD SITE NOT SPECIFIED  Final   Special Requests   Final    BOTTLES DRAWN AEROBIC AND ANAEROBIC Blood Culture adequate volume   Culture   Final    NO GROWTH 5 DAYS Performed at [facility], [address], [place], [place] [address]    Report Status 07/24/2024 FINAL  Final  Blood culture (routine x 2)     Status: None   Collection Time: 07/19/24  8:16 PM   Specimen: BLOOD RIGHT ARM  Result Value Ref Range Status   Specimen Description BLOOD RIGHT ARM  Final   Special Requests   Final     BOTTLES DRAWN AEROBIC AND ANAEROBIC Blood Culture adequate volume   Culture   Final    NO GROWTH 5 DAYS Performed at [facility], [address], [place], [place]  [address]    Report Status 07/24/2024 FINAL  Final  MRSA Next Gen by PCR, Nasal     Status: None   Collection Time: 07/19/24  9:03 PM   Specimen: Nasal Mucosa; Nasal Swab  Result Value Ref Range Status   MRSA by PCR Next Gen NOT DETECTED NOT DETECTED Final    Comment: (NOTE) The GeneXpert MRSA Assay (FDA approved for NASAL specimens only), is one component of a comprehensive MRSA colonization surveillance program. It is not intended to diagnose MRSA infection nor to guide or monitor treatment for MRSA infections. Test performance is not FDA approved in patients less than 65 years old. Performed at [facility] [facility], [address], [place], [place] [address]      Time coordinating discharge: Over 35 minutes  SIGNED:   Erle Odell Castor, MD  [facility] 07/24/2024, 8:59 AM Pager   If 7PM-7AM, please contact night-coverage [email or web address] Password [email or web address]

## 2024-07-24 NOTE — Progress Notes (Signed)
 Transition of Care [facility]) - Inpatient Brief Assessment   Patient Details  Name: Andrea Brooks MRN: [id number] Date of Birth: 1936-01-30  Transition of Care [facility]) CM/SW Contact:    Rosaline JONELLE Joe, RN Phone Number: 07/24/2024, 10:35 AM   Clinical Narrative: CM met with the patient at the bedside to offer Medicare choice regarding home health and patient did not have a preference.   I called [facility] and [facility] accepted for home health services.  HH orders are in placed and signed by MD.  No other IP Care management needs.   Transition of Care Asessment: Insurance and Status: (P) Insurance coverage has been reviewed Patient has primary care physician: (P) Yes Home environment has been reviewed: (P) from  home Prior level of function:: (P) self Prior/Current Home Services: (P) No current home services Social Drivers of Health Review: (P) SDOH reviewed interventions complete Readmission risk has been reviewed: (P) Yes Transition of care needs: (P) transition of care needs identified, TOC will continue to follow

## 2024-07-24 NOTE — Care Management Important Message (Signed)
 Important Message  Patient Details  Name: Andrea Brooks MRN: [id number] Date of Birth: 01/24/1936   Important Message Given:  Yes - Medicare IM     [name] 07/24/2024, 11:17 AM

## 2024-07-25 ENCOUNTER — Telehealth: Payer: Self-pay

## 2024-07-25 NOTE — Telephone Encounter (Signed)
 Copied from CRM [id number]. Topic: Clinical - Home Health Verbal Orders >> Jul 25, 2024 12:43 PM Ivette P wrote: Caller/Agency: [facility]  Callback Number: [phone]- secured Line Service Requested: Physical Therapy Frequency: 1 time a week, for 9 weeks Any new concerns about the patient? No

## 2024-07-27 ENCOUNTER — Ambulatory Visit

## 2024-07-27 DIAGNOSIS — Z0000 Encounter for general adult medical examination without abnormal findings: Secondary | ICD-10-CM

## 2024-07-27 NOTE — Addendum Note (Signed)
 Addended by: Shaquitta Burbridge E on: 07/27/2024 09:14 AM   Modules accepted: Level of Service

## 2024-07-27 NOTE — Progress Notes (Addendum)
 Subjective:   Andrea Brooks is a 88 y.o. who presents for a Medicare Wellness preventive visit.  As a reminder, Annual Wellness Visits don't include a physical exam, and some assessments may be limited, especially if this visit is performed virtually. We may recommend an in-person follow-up visit with your provider if needed.  Visit Complete: Virtual I connected with  Andrea Brooks on 07/27/24 by a audio enabled telemedicine application and verified that I am speaking with the correct person using two identifiers.  Patient Location: Home  Provider Location: Home Office  I discussed the limitations of evaluation and management by telemedicine. The patient expressed understanding and agreed to proceed.  Vital Signs: Because this visit was a virtual/telehealth visit, some criteria may be missing or patient reported. Any vitals not documented were not able to be obtained and vitals that have been documented are patient reported.  VideoError- Librarian, academic were attempted between this provider and patient, however failed, due to patient having technical difficulties OR patient did not have access to video capability.  We continued and completed visit with audio only.   Persons Participating in Visit: Patient.  AWV Questionnaire: No: Patient Medicare AWV questionnaire was not completed prior to this visit.  Cardiac Risk Factors include: advanced age (>12men, >93 women);diabetes mellitus;dyslipidemia;hypertension     Objective:    Today's Vitals   There is no height or weight on file to calculate BMI.     07/27/2024    9:00 AM 07/19/2024    2:55 PM 07/15/2024    9:00 AM 07/15/2024    5:08 AM 08/04/2023    4:20 PM 08/04/2023   11:39 AM 04/08/2023    2:48 PM  Advanced Directives  Does Patient Have a Medical Advance Directive? Yes No  No Yes Yes Yes  Type of Advance Directive Living will    Living will;Healthcare Power of [employer] Power of  [place];Living will  Does patient want to make changes to medical advance directive?     Yes (Inpatient - patient defers changing a medical advance directive and declines information at this time)    Copy of Healthcare Power of Attorney in Chart?     No - copy requested  No - copy requested  Would patient like information on creating a medical advance directive?   No - Patient declined        Current Medications (verified) Outpatient Encounter Medications as of 07/27/2024  Medication Sig   acetaminophen  (TYLENOL ) 500 MG tablet Take 1 tablet (500 mg total) by mouth every 8 (eight) hours as needed for moderate pain.   aspirin  EC 81 MG tablet Take 81 mg by mouth daily.   calcium  carbonate (TUMS - DOSED IN MG ELEMENTAL CALCIUM ) 500 MG chewable tablet Chew 2 tablets (400 mg of elemental calcium  total) by mouth daily as needed for indigestion or heartburn.   carvedilol  (COREG ) 3.125 MG tablet Take 1 tablet (3.125 mg total) by mouth 2 (two) times daily with a meal.   furosemide  (LASIX ) 40 MG tablet Take 1 tablet (40 mg total) by mouth daily. (Patient taking differently: Take 20 mg by mouth daily.)   LORazepam  (ATIVAN ) 1 MG tablet TAKE 1/2 TO 1 TABLET AS    NEEDED FOR ANXIETY (MAXIMUMONCE DAILY)   Magnesium  200 MG CHEW Chew 400 mg by mouth 2 (two) times daily.   metroNIDAZOLE  (FLAGYL ) 250 MG tablet Take 250 mg by mouth every morning.   Multiple Vitamins-Minerals (MULTI FOR HER 50+)  TABS Take 1 tablet by mouth daily.   polyethylene glycol powder (GLYCOLAX /MIRALAX ) 17 GM/SCOOP powder Take 8.5 g by mouth as needed for moderate constipation.   pyridostigmine  (MESTINON ) 60 MG tablet Take 30 mg by mouth daily.   simvastatin  (ZOCOR ) 20 MG tablet Take 1 tablet (20 mg total) by mouth at bedtime.   valsartan  (DIOVAN ) 320 MG tablet Take 1 tablet (320 mg total) by mouth daily.   pantoprazole  (PROTONIX ) 40 MG tablet Take 1 tablet (40 mg total) by mouth 2 (two) times daily. (Patient not taking: Reported on 07/27/2024)    No facility-administered encounter medications on file as of 07/27/2024.    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   Left forearm and Left forearm superior   Past Surgical History:  Procedure Laterality Date   ABDOMINAL HYSTERECTOMY     total   BREAST BIOPSY Left    neg   CHOLECYSTECTOMY     JOINT REPLACEMENT Bilateral    knee   LAPAROTOMY N/A 03/11/2021   Procedure: EXPLORATORY LAPAROTOMY;  Surgeon: Signe Mitzie LABOR, MD;  Location: [facility] OR;  Service: General;  Laterality: N/A;   LEFT HEART CATH AND CORONARY ANGIOGRAPHY N/A 07/04/2018   Procedure: LEFT HEART CATH AND CORONARY ANGIOGRAPHY;  Surgeon: Dann Candyce RAMAN, MD;  Location: [facility] INVASIVE CV LAB;  Service: Cardiovascular;  Laterality: N/A;   REPLACEMENT TOTAL KNEE BILATERAL     Family History  Problem Relation Age of Onset   Hypertension Mother    Colon cancer Mother    Stroke Father    Heart failure Father    Stroke Sister    Stroke Brother    Graves' disease Daughter    Breast cancer Paternal Aunt    Graves' disease Sister    Bone cancer Sister    Breast cancer Maternal Aunt    Esophageal cancer Neg Hx    Rectal cancer Neg Hx    Liver cancer Neg Hx    Social History   Socioeconomic History   Marital status: Married    Spouse name: Not on file   Number of children: 1   Years of education: Not on file   Highest education level: Not on file  Occupational History   Occupation: retired  Tobacco Use   Smoking status: Former    Current packs/day: 0.00    Average  packs/day: 1 pack/day for 2.0 years (2.0 ttl pk-yrs)    Types: Cigarettes    Start date: 11/23/1957    Quit date: 11/24/1959    Years since quitting: 64.7    Passive exposure: Past   Smokeless tobacco: Never  Vaping Use   Vaping status: Never Used  Substance and Sexual Activity   Alcohol use: No   Drug use: No   Sexual activity: Not Currently  Other Topics Concern   Not on file  Social History Narrative   Not on file   Social Drivers of Health   Financial Resource Strain: Low Risk  (07/27/2024)   Overall Financial Resource  Strain (CARDIA)    Difficulty of Paying Living Expenses: Not hard at all  Food Insecurity: No Food Insecurity (07/27/2024)   Hunger Vital Sign    Worried About Running Out of Food in the Last Year: Never true    Ran Out of Food in the Last Year: Never true  Transportation Needs: No Transportation Needs (07/27/2024)   PRAPARE - Administrator, Civil Service (Medical): No    Lack of Transportation (Non-Medical): No  Physical Activity: Inactive (07/27/2024)   Exercise Vital Sign    Days of Exercise per Week: 0 days    Minutes of Exercise per Session: 0 min  Stress: No Stress Concern Present (07/27/2024)   [employer] of Occupational Health - Occupational Stress Questionnaire    Feeling of Stress: Only a little  Social Connections: Moderately Isolated (07/27/2024)   Social Connection and Isolation Panel    Frequency of Communication with Friends and Family: More than three times a week    Frequency of Social Gatherings with Friends and Family: More than three times a week    Attends Religious Services: Never    Database administrator or Organizations: No    Attends Engineer, structural: Never    Marital Status: Married    Tobacco Counseling Counseling given: Not Answered    Clinical Intake:  Pre-visit preparation completed: Yes  Pain : No/denies pain     Nutritional Risks: Nausea/ vomitting/ diarrhea (vomiting a few days  ago) Diabetes: Yes CBG done?: No Did pt. bring in CBG monitor from home?: No  Lab Results  Component Value Date   HGBA1C 5.6 07/19/2024   HGBA1C 5.3 01/18/2024   HGBA1C 5.6 08/04/2023     How often do you need to have someone help you when you read instructions, pamphlets, or other written materials from your doctor or pharmacy?: 1 - Never  Interpreter Needed?: No  Information entered by :: NAllen LPN   Activities of Daily Living     07/27/2024    8:50 AM 07/15/2024    9:03 AM  In your present state of health, do you have any difficulty performing the following activities:  Hearing? 0 0  Vision? 0 0  Comment wears glasses   Difficulty concentrating or making decisions? 0 0  Walking or climbing stairs? 0   Dressing or bathing? 0   Doing errands, shopping? 0   Preparing Food and eating ? N   Using the Toilet? N   In the past six months, have you accidently leaked urine? Y   Do you have problems with loss of bowel control? Y   Comment a little bit   Managing your Medications? N   Managing your Finances? N   Housekeeping or managing your Housekeeping? Y     Patient Care Team: Chandra Toribio POUR, MD as PCP - General (Family Medicine) [name], MD as PCP - Cardiology (Cardiology) Legrand Victory LITTIE MOULD, MD as Consulting Physician (Gastroenterology)  I have updated your Care Teams any recent Medical Services you may have received from other providers in the past year.     Assessment:   This is a routine wellness examination for Georgie.  Hearing/Vision screen Hearing Screening - Comments:: Denies hearing issues Vision Screening - Comments:: Regular eye exams, [facility]   Goals Addressed             This Visit's Progress    Patient Stated       07/27/2024, get well  Depression Screen     07/27/2024    9:01 AM 01/18/2024   11:18 AM 09/16/2023    9:35 AM 04/08/2023    2:43 PM 03/17/2023   11:00 AM 09/15/2022    8:43 AM 03/13/2022   11:10 AM  PHQ 2/9  Scores  PHQ - 2 Score 0 0 0 0 0 0 0  PHQ- 9 Score 4 2 3  0 1 3 3     Fall Risk     07/27/2024    9:01 AM 01/18/2024   11:17 AM 04/08/2023    2:47 PM 04/07/2023   12:26 PM 03/17/2023   11:00 AM  Fall Risk   Falls in the past year? 0 0 0 0 0  Number falls in past yr: 0 0 0 0 0  Injury with Fall? 0 0 0 0 0  Risk for fall due to : Medication side effect No Fall Risks No Fall Risks  No Fall Risks  Follow up Falls evaluation completed;Falls prevention discussed Falls evaluation completed Falls prevention discussed  Falls evaluation completed    MEDICARE RISK AT HOME:  Medicare Risk at Home Any stairs in or around the home?: Yes If so, are there any without handrails?: No Home free of loose throw rugs in walkways, pet beds, electrical cords, etc?: Yes Adequate lighting in your home to reduce risk of falls?: Yes Life alert?: No Use of a cane, walker or w/c?: No Grab bars in the bathroom?: Yes Shower chair or bench in shower?: Yes Elevated toilet seat or a handicapped toilet?: Yes  TIMED UP AND GO:  Was the test performed?  No  Cognitive Function: 6CIT completed        07/27/2024    9:02 AM 04/08/2023    2:48 PM 09/15/2022    8:37 AM 05/09/2021   11:03 AM 04/04/2019    1:19 PM  6CIT Screen  What Year? 0 points 0 points 0 points 0 points 0 points  What month? 0 points 0 points 0 points 0 points 0 points  What time? 0 points 0 points 0 points 0 points 0 points  Count back from 20 0 points 0 points 0 points 0 points 0 points  Months in reverse 0 points 0 points 0 points 0 points 0 points  Repeat phrase 0 points 0 points 0 points 0 points 2 points  Total Score 0 points 0 points 0 points 0 points 2 points    Immunizations Immunization History  Administered Date(s) Administered   Fluad Quad(high Dose 65+) 11/11/2020, 09/05/2021, 10/12/2022   INFLUENZA, HIGH DOSE SEASONAL PF 09/19/2018, 10/02/2019, 11/18/2023   Influenza-Unspecified 10/05/2017, 10/02/2019   PFIZER(Purple  Top)SARS-COV-2 Vaccination 01/21/2020, 02/12/2020, 10/10/2020   Pneumococcal Conjugate-13 10/08/2014, 10/05/2017   Pneumococcal Polysaccharide-23 09/08/1999   Respiratory Syncytial Virus Vaccine,Recomb Aduvanted(Arexvy) 11/18/2023   Tdap 07/14/2016   Zoster Recombinant(Shingrix) 09/22/2018, 01/11/2019, 06/09/2019   Zoster, Live 09/05/2012    Screening Tests Health Maintenance  Topic Date Due   Pneumococcal Vaccine: 50+ Years (3 of 3 - PCV20 or PCV21) 10/05/2022   OPHTHALMOLOGY EXAM  09/15/2023   INFLUENZA VACCINE  06/23/2024   COVID-19 Vaccine (4 - 2025-26 season) 07/24/2024   FOOT EXAM  01/17/2025   HEMOGLOBIN A1C  01/19/2025   Medicare Annual Wellness (AWV)  07/27/2025   DTaP/Tdap/Td (2 - Td or Tdap) 07/14/2026   DEXA SCAN  Completed   Zoster Vaccines- Shingrix  Completed   HPV VACCINES  Aged Out   Meningococcal B Vaccine  Aged Out  Health Maintenance  Health Maintenance Due  Topic Date Due   Pneumococcal Vaccine: 50+ Years (3 of 3 - PCV20 or PCV21) 10/05/2022   OPHTHALMOLOGY EXAM  09/15/2023   INFLUENZA VACCINE  06/23/2024   COVID-19 Vaccine (4 - 2025-26 season) 07/24/2024   Health Maintenance Items Addressed: Declines covid at this time. Due for flu and pneumonia vaccine  Additional Screening:  Vision Screening: Recommended annual ophthalmology exams for early detection of glaucoma and other disorders of the eye. Would you like a referral to an eye doctor? No    Dental Screening: Recommended annual dental exams for proper oral hygiene  Community Resource Referral / Chronic Care Management: CRR required this visit?  No   CCM required this visit?  No   Plan:    I have personally reviewed and noted the following in the patient's chart:   Medical and social history Use of alcohol, tobacco or illicit drugs  Current medications and supplements including opioid prescriptions. Patient is not currently taking opioid prescriptions. Functional ability and  status Nutritional status Physical activity Advanced directives List of other physicians Hospitalizations, surgeries, and ER visits in previous 12 months Vitals Screenings to include cognitive, depression, and falls Referrals and appointments  In addition, I have reviewed and discussed with patient certain preventive protocols, quality metrics, and best practice recommendations. A written personalized care plan for preventive services as well as general preventive health recommendations were provided to patient.   Ardella FORBES Dawn, LPN   [id number]   After Visit Summary: (MyChart) Due to this being a telephonic visit, the after visit summary with patients personalized plan was offered to patient via MyChart   Notes: Nothing significant to report at this time.

## 2024-07-27 NOTE — Patient Instructions (Signed)
 Andrea Brooks , Thank you for taking time out of your busy schedule to complete your Annual Wellness Visit with me. I enjoyed our conversation and look forward to speaking with you again next year. I, as well as your care team,  appreciate your ongoing commitment to your health goals. Please review the following plan we discussed and let me know if I can assist you in the future. Your Game plan/ To Do List    Referrals: If you haven't heard from the office you've been referred to, please reach out to them at the phone provided.   Follow up Visits: We will see or speak with you next year for your Next Medicare AWV with our clinical staff Have you seen your provider in the last 6 months (3 months if uncontrolled diabetes)? Yes  Clinician Recommendations:  Aim for 30 minutes of exercise or brisk walking, 6-8 glasses of water, and 5 servings of fruits and vegetables each day.       This is a list of the screenings recommended for you:  Health Maintenance  Topic Date Due   Pneumococcal Vaccine for age over 18 (3 of 3 - PCV20 or PCV21) 10/05/2022   Eye exam for diabetics  09/15/2023   Flu Shot  06/23/2024   COVID-19 Vaccine (4 - 2025-26 season) 07/24/2024   Complete foot exam   01/17/2025   Hemoglobin A1C  01/19/2025   Medicare Annual Wellness Visit  07/27/2025   DTaP/Tdap/Td vaccine (2 - Td or Tdap) 07/14/2026   DEXA scan (bone density measurement)  Completed   Zoster (Shingles) Vaccine  Completed   HPV Vaccine  Aged Out   Meningitis B Vaccine  Aged Out    Advanced directives: (Copy Requested) Please bring a copy of your health care power of attorney and living will to the office to be added to your chart at your convenience. You can mail to [facility] [address] [place], [place] [address] or email to [email or web address] Advance Care Planning is important because it:  [x]  Makes sure you receive the medical care that is consistent with your values, goals, and  preferences  [x]  It provides guidance to your family and loved ones and reduces their decisional burden about whether or not they are making the right decisions based on your wishes.  Follow the link provided in your after visit summary or read over the paperwork we have mailed to you to help you started getting your Advance Directives in place. If you need assistance in completing these, please reach out to us  so that we can help you!  See attachments for Preventive Care and Fall Prevention Tips.

## 2024-07-31 ENCOUNTER — Ambulatory Visit (INDEPENDENT_AMBULATORY_CARE_PROVIDER_SITE_OTHER): Admitting: Family Medicine

## 2024-07-31 ENCOUNTER — Encounter: Payer: Self-pay | Admitting: Family Medicine

## 2024-07-31 DIAGNOSIS — K219 Gastro-esophageal reflux disease without esophagitis: Secondary | ICD-10-CM | POA: Diagnosis not present

## 2024-07-31 DIAGNOSIS — I5033 Acute on chronic diastolic (congestive) heart failure: Secondary | ICD-10-CM

## 2024-07-31 DIAGNOSIS — I5032 Chronic diastolic (congestive) heart failure: Secondary | ICD-10-CM

## 2024-07-31 MED ORDER — FUROSEMIDE 20 MG PO TABS: 20.0000 mg | ORAL_TABLET | Freq: Every day | ORAL | 2 refills | Status: DC

## 2024-07-31 NOTE — Patient Instructions (Addendum)
 It was nice to see you today,  We addressed the following topics today: -I am ordering your labs.  We will recheck them again in 2 weeks - What we do with your magnesium  supplementation will depend on what your repeat magnesium  levels are.  For now just continue taking your medications as prescribed. - I am sending in a prescription for 20 mg Lasix .  Take a full tablet of this it is half the dose of your previous Lasix  prescription. - When you see Dr. Mona again, talk to them about if you can take the Lasix  every other day or as needed because this medication can also lower your magnesium  level.   Have a great day,  Rolan Slain, MD

## 2024-07-31 NOTE — Progress Notes (Unsigned)
 Established Patient Office Visit  Subjective   Patient ID: Andrea Brooks, female    DOB: [birth year]  Age: 88 y.o. MRN: [id number]  No chief complaint on file.   HPI  Subjective - Follow-up post-hospitalization for vomiting and low electrolytes. Discharged 07/25/2024. Hospitalization was due to low magnesium , low potassium, and low calcium , leading to infusions. Also presented with what was described as septic shock. Episode preceded by a few days of not having a bowel movement, leading to what is believed to be a partial bowel obstruction and brown, feculent emesis. Reports no nausea with emesis, it just comes up. - Since discharge, reports feeling better but having 5-6 bowel movements daily, which is described as their normal on current regimen. Denies loose stools, diarrhea, or discoloration (red, maroon, black). - Appetite has been good since returning home. Reports feeling very tired and weak post-discharge, which is improving. - Discussed coordination of care for chronic hypomagnesemia. Gastroenterologist (Dr. Marinda at [facility], Dr. Jule) and cardiologist (Dr. Mona) have deferred ongoing management of magnesium  levels to primary care.  Medications Taking pantoprazole  (Protonix ), switched from omeprazole  in the hospital, and advised to take Tums with it. Takes magnesium  gummies 200mg , four times daily, which are well tolerated. Previously unable to tolerate oral magnesium  capsules due to nausea. Takes Flagyl  chronically for pneumatosis intestinalis. Previously prescribed calcium  supplements but is not currently taking them. Reports taking large potassium pills in the hospital. Has taken vitamin D  in the past.  PMH, PSH, FH, Social Hx PMHx: Chronic hypomagnesemia and hypocalcemia, requiring IV infusions. History of partial small bowel obstruction, described as a kink in the bowel. Pneumatosis intestinalis. GERD. Alpha-gal allergy. History of E. coli infection. History of septic  shock. Social Hx: Lives in an adjoining house to family who assist with care and meal preparation.  ROS GI: Reports 5-6 bowel movements per day, denies diarrhea or abnormal stool color. Denies nausea with emesis. Reports history of constipation leading to obstruction. Reports burping when off Flagyl . Appetite is good. Constitutional: Reports significant fatigue and weakness post-hospitalization, which is improving.  Objective No objective findings were mentioned in the visit.  Assessment and Plan Chronic Hypomagnesemia and Hypocalcemia - This is a chronic issue, likely secondary to malabsorption from underlying pneumatosis intestinalis and history of partial small bowel obstruction. The patient was recently hospitalized for severe hypomagnesemia (0.6), hypocalcemia, and hypokalemia, requiring IV replacement. They presented with feculent emesis and were diagnosed with septic shock. They are currently taking a magnesium  gummy supplement which is well tolerated, unlike previous oral capsule formulations. The plan is to monitor levels to determine if oral supplementation is adequate. - Check CMP and magnesium  level today. - Will message with next steps based on lab results. If levels are stable, will recheck in one month and then extend the interval. If levels are low, will recheck sooner. - Discussed possibility of ordering IV magnesium  infusions if oral supplementation is insufficient. - Recheck vitamin D  level. Advised that if vitamin D  is low, it should be taken with calcium .  GERD - Currently taking pantoprazole , switched from omeprazole  during recent hospitalization. Long-term PPI use can contribute to low calcium  and magnesium  levels. This is noted, but the medication is necessary for symptom control. - Continue pantoprazole  as directed.   The ASCVD Risk score (Arnett DK, et al., 2019) failed to calculate for the following reasons:   The 2019 ASCVD risk score is only valid for ages 90 to  64   Risk score cannot be calculated because  patient has a medical history suggesting prior/existing ASCVD  Health Maintenance Due  Topic Date Due   Pneumococcal Vaccine: 50+ Years (3 of 3 - PCV20 or PCV21) 11/30/2017   OPHTHALMOLOGY EXAM  09/15/2023   Influenza Vaccine  06/23/2024   COVID-19 Vaccine (4 - 2025-26 season) 07/24/2024      Objective:     There were no vitals taken for this visit. {Vitals History (Optional):23777}  Physical Exam   No results found for any visits on 07/31/24.      Assessment & Plan:   There are no diagnoses linked to this encounter.   No follow-ups on file.    Toribio MARLA Slain, MD

## 2024-08-01 LAB — VITAMIN D 25 HYDROXY (VIT D DEFICIENCY, FRACTURES): Vit D, 25-Hydroxy: 26 ng/mL — ABNORMAL LOW (ref 30.0–100.0)

## 2024-08-01 LAB — COMPREHENSIVE METABOLIC PANEL WITH GFR
ALT: 10 IU/L (ref 0–32)
AST: 17 IU/L (ref 0–40)
Albumin: 3.6 g/dL — ABNORMAL LOW (ref 3.7–4.7)
Alkaline Phosphatase: 84 IU/L (ref 44–121)
BUN/Creatinine Ratio: 23 (ref 12–28)
BUN: 23 mg/dL (ref 8–27)
Bilirubin Total: <0.2 mg/dL (ref 0.0–1.2)
CO2: 23 mmol/L (ref 20–29)
Calcium: 9.3 mg/dL (ref 8.7–10.3)
Chloride: 105 mmol/L (ref 96–106)
Creatinine, Ser: 0.98 mg/dL (ref 0.57–1.00)
Globulin, Total: 3.1 g/dL (ref 1.5–4.5)
Glucose: 139 mg/dL — ABNORMAL HIGH (ref 70–99)
Potassium: 4.5 mmol/L (ref 3.5–5.2)
Sodium: 142 mmol/L (ref 134–144)
Total Protein: 6.7 g/dL (ref 6.0–8.5)
eGFR: 56 mL/min/1.73 — ABNORMAL LOW (ref >59)

## 2024-08-01 LAB — CALCIUM, IONIZED: Calcium, Ion: 5.1 mg/dL (ref 4.5–5.6)

## 2024-08-01 LAB — MAGNESIUM: Magnesium: 1.8 mg/dL (ref 1.6–2.3)

## 2024-08-01 NOTE — Progress Notes (Unsigned)
 Cardiology Office Note    Date:  08/01/2024  ID:  YLIANA GRAVOIS, DOB [birth year], MRN [id number] PCP:  Chandra Toribio POUR, MD  Cardiologist:  Vinie JAYSON Maxcy, MD  Electrophysiologist:  None   Chief Complaint: ***  History of Present Illness: .    Raquel ANTOINETTE BORGWARDT is a 88 y.o. female with visit-pertinent history of hypertension, hyperlipidemia, diabetes mellitus type 2, CAD, anxiety, GERD and a history of chronic diastolic heart failure.  Patient previously was admitted for persistent diarrhea due to E. coli infection.  She subsequently received large amounts of hydration and developed acute diastolic heart failure.  She required diuresis and was seen by Dr. Shlomo and Dr. Raford.  She was noted to have elevated protein referred for cardiac catheterization in 06/2018.  Her cardiac cath showed mild nonobstructive CAD, 25% proximal LAD, 10% mid RCA, 10% proximal to mid left circumflex lesion.  Echo obtained on 07/03/2018 demonstrated EF 60 to 65%, moderate LVH, no RWMA.  She later established with Dr. Maxcy as he is also her husband's cardiologist.  She has been followed by GI service for gastrointestinal dysmotility.  She is also followed by [facility] and on pyridostigmine  bromide daily to slow her bowels.   On 07/22/2023 patient presented to the ED for nonspecific chest pain that had been ongoing for a week.  Symptoms worsens when she took a deep breath.  Hstroponin 57>55.  Chest x-ray showed underinflation of the lungs with some interstitial changes which is chronic, tiny left pleural effusion.  CTA of the chest shows no PE, cardiomegaly with mild interstitial pulmonary edema and a small left pleural effusion, dilated ascending aorta measuring 4.3 cm, aortic atherosclerosis with coronary artery calcification.  COVID and influenza test negative.  Her symptoms was suspected to be musculoskeletal in nature.  She was readmitted on 08/04/2023 with tingling and numbness in the extremities.  On arrival  white blood count was elevated at 25,000.  Her potassium was low at 3.  Troponin elevated at 93.  Due to her GI condition at baseline is noted that she has very poor appetite.  Her Flagyl  had previously been increased to 500 mg twice a day but she stopped taking it as she developed tingling numbness.  She is found to have acute on chronic diastolic heart failure on exam, moderate protein calorie malnutrition, hypomagnesemia and hypocalcemia.  She was treated with IV Lasix .  Echocardiogram obtained on 08/05/2023 showed EF 60 to 65%, mild LVH, grade 2 DD, normal RV, mild MR, dilated ascending aorta measuring at 42 mm.  She was treated with empiric antibiotic for possible right lower lobe pneumonia on admission.   She was seen in clinic on 08/11/2023, her breathing was reported as stable.  She did receive oxygen therapy at home, however it was noted she was not regularly using.  She recently seen her PCP and her magnesium  had dropped from 1.6 down to 1.0.  Her PCP had started her on oral magnesium  which she was unable to tolerate.  Patient was set up to receive IV magnesium  with the IV infusion center. She was seen by Dr. Maxcy in February at which time she has been sick after going on a cruise. COVID test was negative. BNP was elevated at 1424, hemoglobin 11, hematocrit 35, potassium 4.9, magnesium  1.9. Her Lasix  was increased to 40 mg twice daily.  On follow-up with myself she had improved and her Lasix  was reduced to 40 mg daily.  Patient was last in  clinic on 07/14/2024 by Dr. Mona, patient had noted she had recently been very shaky.  Reported chronic frequent bowel movements.  Her carvedilol  was decreased to 3.125 mg twice daily.  Patient's magnesium  was critically low at 0.6 and her calcium  low at 6.5, sodium 146 and creatinine 1.08.  Patient was directed to the emergency department and admitted.  Patient underwent IV magnesium  repletion and calcium  repletion.  Patient was discharged on 07/16/2024.  Patient  return to the emergency department on 07/19/2024 presenting with nausea, vomiting, abdominal pain that had started 3 days prior to admission, was started on pressors for hypovolemic shock and was found to have a small bowel obstruction for which NG tube was placed.  Pressors were weaned on 07/17/2024, CT scan of the abdomen and pelvis showed no recurrent pneumatosis intestinalis, follow-up with [facility] specialty clinic as outpatient was recommended.  Patient was discharged on 07/24/2024 in stable condition.  Today she presents for follow-up.  She reports that she   Chronic diastolic HF: Echocardiogram obtained on 08/05/2023 showed EF 60 to 65%, mild LVH, grade 2 DD, normal RV, mild MR, dilated ascending aorta measuring at 42 mm.  Today she appears Continue: CAD: Cardiac cath in 06/2018 indicated mild nonobstructive CAD, 25% proximal LAD, 10% mid RCA, 10% proximal to mid left circumflex lesion.   Hypertension: Blood pressure today  Hyperlipidemia: Last lipid profile on  Hypomagnesemia: Last magnesium  Monitored and managed per PCP.    Labwork independently reviewed:   ROS: .   *** denies chest pain, shortness of breath, lower extremity edema, fatigue, palpitations, melena, hematuria, hemoptysis, diaphoresis, weakness, presyncope, syncope, orthopnea, and PND.  All other systems are reviewed and otherwise negative.  Studies Reviewed: SABRA    EKG:  EKG is ordered today, personally reviewed, demonstrating ***     CV Studies: Cardiac studies reviewed are outlined and summarized above. Otherwise please see EMR for full report. Cardiac Studies & Procedures   ______________________________________________________________________________________________ CARDIAC CATHETERIZATION  CARDIAC CATHETERIZATION 07/04/2018  Conclusion  Prox LAD lesion is 25% stenosed.  Mid RCA lesion is 10% stenosed.  Prox Cx to Mid Cx lesion is 10% stenosed.  LV end diastolic pressure is mildly elevated. LVEDP 16 mm Hg.   There is no aortic valve stenosis.  Nonobstructive CAD.  Continue aggressive secondary prevention and management of diastolic heart failure.  Findings Coronary Findings Diagnostic  Dominance: Right  Left Anterior Descending Prox LAD lesion is 25% stenosed.  Left Circumflex Prox Cx to Mid Cx lesion is 10% stenosed.  Right Coronary Artery Mid RCA lesion is 10% stenosed.  Intervention  No interventions have been documented.     ECHOCARDIOGRAM  ECHOCARDIOGRAM COMPLETE 07/20/2024  Narrative ECHOCARDIOGRAM REPORT    Patient Name:   DARIUS FILLINGIM Date of Exam: 07/20/2024 Medical Rec #:  [id number]      Height:       64.0 in Accession #:    [id number]     Weight:       131.0 lb Date of Birth:  Dec 31, 1935       BSA:          1.634 m Patient Age:    87 years       BP:           107/43 mmHg Patient Gender: F              HR:           55 bpm. Exam Location:  Inpatient  Procedure: 2D Echo, 3D Echo,  Cardiac Doppler and Color Doppler (Both Spectral and Color Flow Doppler were utilized during procedure).  Indications:    Shock  History:        Patient has prior history of Echocardiogram examinations, most recent 08/05/2023. Risk Factors:Diabetes and Dyslipidemia.  Sonographer:    Therisa Crouch Referring Phys: [id number] JESSICA MARSHALL  IMPRESSIONS   1. Left ventricular ejection fraction, by estimation, is 60 to 65%. Left ventricular ejection fraction by 3D volume is 65 %. The left ventricle has normal function. The left ventricle has no regional wall motion abnormalities. Left ventricular diastolic parameters are indeterminate. 2. Right ventricular systolic function is normal. The right ventricular size is normal. There is normal pulmonary artery systolic pressure. The estimated right ventricular systolic pressure is 28.0 mmHg. 3. The mitral valve is normal in structure. No evidence of mitral valve regurgitation. No evidence of mitral stenosis. 4. The aortic valve is normal in  structure. There is moderate calcification of the aortic valve. Aortic valve regurgitation is not visualized. No aortic stenosis is present. Aortic valve mean gradient measures 5.0 mmHg. 5. The inferior vena cava is normal in size with greater than 50% respiratory variability, suggesting right atrial pressure of 3 mmHg.  FINDINGS Left Ventricle: Left ventricular ejection fraction, by estimation, is 60 to 65%. Left ventricular ejection fraction by 3D volume is 65 %. The left ventricle has normal function. The left ventricle has no regional wall motion abnormalities. The left ventricular internal cavity size was normal in size. There is no left ventricular hypertrophy. Left ventricular diastolic parameters are indeterminate.  Right Ventricle: The right ventricular size is normal. No increase in right ventricular wall thickness. Right ventricular systolic function is normal. There is normal pulmonary artery systolic pressure. The tricuspid regurgitant velocity is 2.50 m/s, and with an assumed right atrial pressure of 3 mmHg, the estimated right ventricular systolic pressure is 28.0 mmHg.  Left Atrium: Left atrial size was normal in size.  Right Atrium: Right atrial size was normal in size.  Pericardium: There is no evidence of pericardial effusion.  Mitral Valve: The mitral valve is normal in structure. Mild mitral annular calcification. No evidence of mitral valve regurgitation. No evidence of mitral valve stenosis.  Tricuspid Valve: The tricuspid valve is normal in structure. Tricuspid valve regurgitation is trivial. No evidence of tricuspid stenosis.  Aortic Valve: The aortic valve is normal in structure. There is moderate calcification of the aortic valve. Aortic valve regurgitation is not visualized. No aortic stenosis is present. Aortic valve mean gradient measures 5.0 mmHg. Aortic valve peak gradient measures 8.3 mmHg. Aortic valve area, by VTI measures 2.32 cm.  Pulmonic Valve: The  pulmonic valve was normal in structure. Pulmonic valve regurgitation is trivial. No evidence of pulmonic stenosis.  Aorta: The aortic root is normal in size and structure.  Venous: The inferior vena cava is normal in size with greater than 50% respiratory variability, suggesting right atrial pressure of 3 mmHg.  IAS/Shunts: No atrial level shunt detected by color flow Doppler.  Additional Comments: 3D was performed not requiring image post processing on an independent workstation and was normal.   LEFT VENTRICLE PLAX 2D LVIDd:         4.45 cm         Diastology LVIDs:         2.80 cm         LV e' medial:    6.96 cm/s LV PW:         1.00 cm  LV E/e' medial:  11.3 LV IVS:        1.05 cm         LV e' lateral:   8.70 cm/s LVOT diam:     2.00 cm         LV E/e' lateral: 9.0 LV SV:         80 LV SV Index:   49 LVOT Area:     3.14 cm        3D Volume EF LV 3D EF:    Left ventricul ar ejection fraction by 3D volume is 65 %. LV 3D EDV:   64.63 ml LV 3D ESV:   21.99 ml  3D Volume EF: 3D EF:        65 %  RIGHT VENTRICLE            IVC RV Basal diam:  3.35 cm    IVC diam: 1.38 cm RV S prime:     9.14 cm/s TAPSE (M-mode): 1.4 cm  LEFT ATRIUM             Index LA diam:        4.23 cm 2.59 cm/m LA Vol (A2C):   38.3 ml 23.44 ml/m LA Vol (A4C):   49.4 ml 30.23 ml/m LA Biplane Vol: 44.6 ml 27.29 ml/m AORTIC VALVE AV Area (Vmax):    2.57 cm AV Area (Vmean):   2.72 cm AV Area (VTI):     2.32 cm AV Vmax:           144.00 cm/s AV Vmean:          103.000 cm/s AV VTI:            0.347 m AV Peak Grad:      8.3 mmHg AV Mean Grad:      5.0 mmHg LVOT Vmax:         118.00 cm/s LVOT Vmean:        89.100 cm/s LVOT VTI:          0.256 m LVOT/AV VTI ratio: 0.74  AORTA Ao Asc diam: 3.28 cm  MITRAL VALVE               TRICUSPID VALVE MV Area (PHT): 2.28 cm    TV Peak grad:   25.0 mmHg MV Decel Time: 333 msec    TV Vmax:        2.50 m/s MV E velocity: 78.60 cm/s  TR  Peak grad:   25.0 mmHg MV A velocity: 98.50 cm/s  TR Vmax:        250.00 cm/s MV E/A ratio:  0.80 SHUNTS Systemic VTI:  0.26 m Systemic Diam: 2.00 cm  Aditya Sabharwal Electronically signed by Ria Commander Signature Date/Time: 07/20/2024/8:58:41 AM    Final          ______________________________________________________________________________________________       Current Reported Medications:.    No outpatient medications have been marked as taking for the 08/02/24 encounter (Appointment) with Grayling Schranz D, NP.    Physical Exam:    VS:  There were no vitals taken for this visit.   Wt Readings from Last 3 Encounters:  07/31/24 133 lb 6.4 oz (60.5 kg)  07/24/24 136 lb 7.4 oz (61.9 kg)  07/16/24 129 lb 1.6 oz (58.6 kg)    GEN: Well nourished, well developed in no acute distress NECK: No JVD; No carotid bruits CARDIAC: ***RRR, no murmurs, rubs, gallops RESPIRATORY:  Clear to auscultation without rales, wheezing or  rhonchi  ABDOMEN: Soft, non-tender, non-distended EXTREMITIES:  No edema; No acute deformity     Asessement and Plan:.     ***     Disposition: F/u with ***  Signed, Thelbert Gartin D Llana Deshazo, NP

## 2024-08-02 ENCOUNTER — Encounter: Payer: Self-pay | Admitting: Cardiology

## 2024-08-02 ENCOUNTER — Ambulatory Visit: Attending: Cardiology | Admitting: Cardiology

## 2024-08-02 ENCOUNTER — Ambulatory Visit: Payer: Self-pay | Admitting: Family Medicine

## 2024-08-02 VITALS — BP 108/58 | HR 64 | Ht 63.0 in | Wt 131.2 lb

## 2024-08-02 DIAGNOSIS — I2510 Atherosclerotic heart disease of native coronary artery without angina pectoris: Secondary | ICD-10-CM | POA: Diagnosis not present

## 2024-08-02 DIAGNOSIS — I5032 Chronic diastolic (congestive) heart failure: Secondary | ICD-10-CM | POA: Diagnosis not present

## 2024-08-02 DIAGNOSIS — I471 Supraventricular tachycardia, unspecified: Secondary | ICD-10-CM

## 2024-08-02 DIAGNOSIS — E1159 Type 2 diabetes mellitus with other circulatory complications: Secondary | ICD-10-CM

## 2024-08-02 DIAGNOSIS — I152 Hypertension secondary to endocrine disorders: Secondary | ICD-10-CM

## 2024-08-02 DIAGNOSIS — E782 Mixed hyperlipidemia: Secondary | ICD-10-CM

## 2024-08-02 NOTE — Patient Instructions (Signed)
 Medication Instructions:  Your physician has recommended you make the following change in your medication:   START Metoprolol  25 MG TAKING 1/2 TABLET TWICE DAILY  STOP Carvedilol   *If you need a refill on your cardiac medications before your next appointment, please call your pharmacy*  Lab Work: None ordered  If you have labs (blood work) drawn today and your tests are completely normal, you will receive your results only by: MyChart Message (if you have MyChart) OR A paper copy in the mail If you have any lab test that is abnormal or we need to change your treatment, we will call you to review the results.  Testing/Procedures: None ordered  Follow-Up: At [facility], you and your health needs are our priority.  As part of our continuing mission to provide you with exceptional heart care, our providers are all part of one team.  This team includes your primary Cardiologist (physician) and Advanced Practice Providers or APPs (Physician Assistants and Nurse Practitioners) who all work together to provide you with the care you need, when you need it.  Your next appointment:   As scheduled   Provider:   Hao Meng, PA-C          We recommend signing up for the patient portal called MyChart.  Sign up information is provided on this After Visit Summary.  MyChart is used to connect with patients for Virtual Visits (Telemedicine).  Patients are able to view lab/test results, encounter notes, upcoming appointments, etc.  Non-urgent messages can be sent to your provider as well.   To learn more about what you can do with MyChart, go to [email or web address].   Other Instructions

## 2024-08-03 ENCOUNTER — Telehealth: Payer: Self-pay | Admitting: Internal Medicine

## 2024-08-03 MED ORDER — METOPROLOL TARTRATE 25 MG PO TABS: 12.5000 mg | ORAL_TABLET | Freq: Two times a day (BID) | ORAL | 3 refills | Status: AC

## 2024-08-03 NOTE — Telephone Encounter (Signed)
 Pt c/o medication issue:  1. Name of Medication:   Toprol  (metoprolol ) and valsartan  (DIOVAN ) 320 MG tablet   2. How are you currently taking this medication (dosage and times per day)?   Not started the metoprolol   3. Are you having a reaction (difficulty breathing--STAT)?   4. What is your medication issue?    Patient stated she has some questions regarding her medication changes.  Patient wants to know if she should be taking these two medications.  Patient also wants to know if she should taper off her carvedilol  (COREG ) 3.125 MG tablet .

## 2024-08-03 NOTE — Telephone Encounter (Signed)
 Reviewed office visit note from Katlyn West, NP on 08/02/24:  Continue aspirin  81 mg daily, Lasix  20 mg daily and valsartan  320 mg daily.  Patient notes some episodes of low blood pressures, will stop carvedilol  and start metoprolol  tartrate 12.5 mg twice daily.   Reviewed with patient, Rx for Lopressor  12.5 mg BID sent to pharmacy. Patient verbalized understanding of the above and expressed appreciation for call.

## 2024-08-04 NOTE — Assessment & Plan Note (Signed)
-   Currently taking pantoprazole , switched from omeprazole  during recent hospitalization. Long-term PPI use can contribute to low calcium  and magnesium  levels. This is noted, but the medication is necessary for symptom control. - Continue pantoprazole  as directed.

## 2024-08-04 NOTE — Assessment & Plan Note (Signed)
-  rechecking cmp.  Treat with supplementation if needed.  - Recheck vitamin D  level. Advised that if vitamin D  is low, it should be taken with calcium .

## 2024-08-04 NOTE — Assessment & Plan Note (Signed)
 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 The patient was recently hospitalized for severe hypomagnesemia (0.6), hypocalcemia, and hypokalemia, requiring IV replacement. currently taking a magnesium  gummy supplement which is well tolerated, unlike previous oral capsule formulations.   Check CMP and magnesium  level today. - Will message with next steps based on lab results. If levels are stable, will recheck in one month and then extend the interval. If levels are low, will recheck sooner. - Discussed possibility of ordering IV magnesium  infusions if oral supplementation is insufficient.

## 2024-08-11 ENCOUNTER — Other Ambulatory Visit: Payer: Self-pay | Admitting: Family Medicine

## 2024-08-14 ENCOUNTER — Other Ambulatory Visit

## 2024-08-15 LAB — BASIC METABOLIC PANEL WITH GFR
BUN/Creatinine Ratio: 15 (ref 12–28)
BUN: 14 mg/dL (ref 8–27)
CO2: 24 mmol/L (ref 20–29)
Calcium: 9.1 mg/dL (ref 8.7–10.3)
Chloride: 101 mmol/L (ref 96–106)
Creatinine, Ser: 0.93 mg/dL (ref 0.57–1.00)
Glucose: 102 mg/dL — ABNORMAL HIGH (ref 70–99)
Potassium: 4 mmol/L (ref 3.5–5.2)
Sodium: 140 mmol/L (ref 134–144)
eGFR: 60 mL/min/1.73 (ref >59)

## 2024-08-15 LAB — MAGNESIUM: Magnesium: 1.6 mg/dL (ref 1.6–2.3)

## 2024-08-18 ENCOUNTER — Ambulatory Visit: Payer: Self-pay | Admitting: Family Medicine

## 2024-08-25 ENCOUNTER — Other Ambulatory Visit: Payer: Self-pay | Admitting: Family Medicine

## 2024-08-28 ENCOUNTER — Other Ambulatory Visit

## 2024-08-29 ENCOUNTER — Ambulatory Visit: Payer: Self-pay

## 2024-08-29 LAB — BASIC METABOLIC PANEL WITH GFR
BUN/Creatinine Ratio: 11 — ABNORMAL LOW (ref 12–28)
BUN: 10 mg/dL (ref 8–27)
CO2: 25 mmol/L (ref 20–29)
Calcium: 9 mg/dL (ref 8.7–10.3)
Chloride: 104 mmol/L (ref 96–106)
Creatinine, Ser: 0.93 mg/dL (ref 0.57–1.00)
Glucose: 93 mg/dL (ref 70–99)
Potassium: 4.2 mmol/L (ref 3.5–5.2)
Sodium: 143 mmol/L (ref 134–144)
eGFR: 60 mL/min/1.73 (ref >59)

## 2024-08-29 LAB — MAGNESIUM: Magnesium: 1.9 mg/dL (ref 1.6–2.3)

## 2024-08-29 NOTE — Progress Notes (Signed)
 Patient has been schedule.

## 2024-08-30 ENCOUNTER — Encounter: Payer: Self-pay | Admitting: Physician Assistant

## 2024-08-30 ENCOUNTER — Ambulatory Visit: Attending: Cardiology | Admitting: Physician Assistant

## 2024-08-30 VITALS — BP 152/52 | HR 61 | Resp 16 | Ht 63.0 in | Wt 131.6 lb

## 2024-08-30 DIAGNOSIS — I2510 Atherosclerotic heart disease of native coronary artery without angina pectoris: Secondary | ICD-10-CM

## 2024-08-30 DIAGNOSIS — E785 Hyperlipidemia, unspecified: Secondary | ICD-10-CM

## 2024-08-30 DIAGNOSIS — E119 Type 2 diabetes mellitus without complications: Secondary | ICD-10-CM | POA: Diagnosis not present

## 2024-08-30 DIAGNOSIS — E1159 Type 2 diabetes mellitus with other circulatory complications: Secondary | ICD-10-CM

## 2024-08-30 DIAGNOSIS — I152 Hypertension secondary to endocrine disorders: Secondary | ICD-10-CM

## 2024-08-30 NOTE — Progress Notes (Unsigned)
 Cardiology Office Note   Date:  08/30/2024  ID:  Andrea Brooks, DOB 1936-02-18, MRN [id number] PCP: Andrea Toribio POUR, MD  [facility] Providers Cardiologist:  Andrea JAYSON Maxcy, MD { Click to update primary MD,subspecialty MD or APP then REFRESH:1}    History of Present Illness Andrea Brooks is a 88 y.o. female with PMH of HTN, HLD, DM II, CAD, anxiety, GERD and a history of chronic diastolic heart failure.  Patient previously was admitted for persistent diarrhea due to E. coli infection.  She subsequently received large amount of hydration and developed acute diastolic heart failure.  She required diuresis and was seen by Andrea Brooks and Andrea [facility].  She was noted to have elevated troponin and referred for cardiac catheterization on 07/04/2018 which showed mild nonobstructive CAD, 25% proximal LAD, 10% mid RCA, 10% proximal to mid left circumflex lesion.  Echocardiogram obtained on 07/03/2018 demonstrated EF 60 to 65%, moderate LVH, no regional wall motion abnormality.  She later established with Andrea Brooks who is also her husband's cardiologist.  She is being followed by GI service for gastrointestinal dysmotility issue.  She is being followed by Andrea Brooks and on pyridostigmine  bromide daily to slow down her bowel.  To prevent bacterial overgrowth from slow bowel, she has been taking Flagyl .    She was seen in the ED in August 2024 due to nonspecific chest pain has been going on for a week.  Symptom worsen when she take a deep breath.  Serial troponin was borderline elevated but remained flat.  Chest x-ray showed underinflation of the lung was some interstitial changes that is chronic.  CTA of the chest showed no PE, cardiomegaly with mild interstitial pulmonary edema and a small left pleural effusion, ascending aorta measuring at 4.3 cm.  COVID and influenza test were negative.  Her symptom was suspected to be musculoskeletal in nature.  She was readmitted in September 2024 with  tingling and numbness in the extremities.  On arrival, white blood cell count was elevated at 24,000, potassium was low at 3.0.  Troponin was elevated at 93.  Due to GI's condition at baseline, she had very poor appetite.  Her Flagyl  was increased but she stopped taking it as she developed tingling numbness.  She was found to be in acute on chronic diastolic heart failure on exam, she has moderate protein calorie malnutrition, hypomagnesemia and hypocalcemia.  She was treated with IV fluid.  Echocardiogram obtained on 08/05/2023 showed EF 60 to 65%, mild LVH, grade 2 DD, normal RV, mild MR, dilated ascending aorta measuring at 42 mm.  She was treated with empiric antibiotic for possible right lower lobe pneumonia on admission.   I last saw the patient in October 2024 she continued to have GI issues and has significant hypomagnesemia, I set her up to receive outpatient IV magnesium  infusion.  She was seen by Andrea Brooks in February at which time she has been sick after going on a cruise.  COVID test was negative.  BNP was elevated at 1424, hemoglobin 11, hematocrit 35, potassium 4.9, magnesium  1.9.  Her Lasix  was increased to 40 mg twice daily.  She was seen by Andrea West NP on 01/14/2024, she was doing better at that time.  Blood work at the time showed her creatinine has trended up from the previous of 0.7 to 1.15, BMP has came down to 58 from the previous 1424.  Lasix  was reduced back to 40 mg daily.  She was last  seen by Dr. Mona in August 2025 at which time she was very shaky blood pressure was 96/50 at the time.  Blood work showed creatinine of 1.08, calcium  was 6.5, magnesium  0.6. She was sent to [facility] and had magnesium  IV infusion.  Calcium  was also repleted.  PPI was held. Patient was readmitted to [facility] near the end of August due to nausea, vomiting and abdominal pain.  He was placed on pressor for hypovolemic shock and found to have small bowel obstruction and required NG tube.  GI was  consulted and recommended conservative management.  She was able to recover and the start having bowel movement again.  ARB and Lasix  held on admission with recommendation to resume them on follow-up.  She was last seen by Andrea [facility] on 08/02/2024 at which time she was doing well she did wear a 2-week heart monitor in August that showed average heart rate of 73 ranging from 56 to 188 bpm.  She had 2 episode of ventricular tachycardia, longest episode was only 4 beats.  She had 107 supraventricular tachycardia, longest lasting 45.8 seconds with average heart rate of 140, PVC burden 2.3%.  Carvedilol  was stopped and she was started on metoprolol  to tartrate 12.5 mg twice a day.  She was back on Lasix  20 mg daily and valsartan  320 mg daily.  ROS: ***  Studies Reviewed      *** Risk Assessment/Calculations {Does this patient have ATRIAL FIBRILLATION?[phone]} The patient's 1st BP is elevated (>139/89)*** Repeat BP and {Click to enter a 2nd BP Refresh Note  :1}       Physical Exam VS:  BP (!) 152/52 (BP Location: Left Arm, Patient Position: Sitting, Cuff Size: Normal)   Pulse 61   Resp 16   Ht 5' 3 (1.6 m)   Wt 131 lb 9.6 oz (59.7 kg)   SpO2 97%   BMI 23.31 kg/m        Wt Readings from Last 3 Encounters:  08/30/24 131 lb 9.6 oz (59.7 kg)  08/02/24 131 lb 3.2 oz (59.5 kg)  07/31/24 133 lb 6.4 oz (60.5 kg)    GEN: Well nourished, well developed in no acute distress NECK: No JVD; No carotid bruits CARDIAC: ***RRR, no murmurs, rubs, gallops RESPIRATORY:  Clear to auscultation without rales, wheezing or rhonchi  ABDOMEN: Soft, non-tender, non-distended EXTREMITIES:  No edema; No deformity   ASSESSMENT AND PLAN ***    {Are you ordering a CV Procedure (e.g. stress test, cath, DCCV, TEE, etc)?   Press F2        :[id number]}  Dispo: ***  Signed, Andrea Ford, PA

## 2024-08-30 NOTE — Patient Instructions (Addendum)
 Medication Instructions:  NO CHANGES  Lab Work: NONE TO BE DONE TODAY.  Testing/Procedures: NONE  Follow-Up: At [facility], you and your health needs are our priority.  As part of our continuing mission to provide you with exceptional heart care, our providers are all part of one team.  This team includes your primary Cardiologist (physician) and Advanced Practice Providers or APPs (Physician Assistants and Nurse Practitioners) who all work together to provide you with the care you need, when you need it.  Your next appointment:   4-05 MONTHS   Provider:   Vinie JAYSON Maxcy, MD

## 2024-09-11 ENCOUNTER — Other Ambulatory Visit: Payer: Self-pay | Admitting: *Deleted

## 2024-09-11 DIAGNOSIS — E559 Vitamin D deficiency, unspecified: Secondary | ICD-10-CM

## 2024-09-11 DIAGNOSIS — E119 Type 2 diabetes mellitus without complications: Secondary | ICD-10-CM

## 2024-09-11 DIAGNOSIS — E1159 Type 2 diabetes mellitus with other circulatory complications: Secondary | ICD-10-CM

## 2024-09-11 DIAGNOSIS — E876 Hypokalemia: Secondary | ICD-10-CM

## 2024-09-11 DIAGNOSIS — E785 Hyperlipidemia, unspecified: Secondary | ICD-10-CM

## 2024-09-19 ENCOUNTER — Other Ambulatory Visit

## 2024-09-19 DIAGNOSIS — E785 Hyperlipidemia, unspecified: Secondary | ICD-10-CM

## 2024-09-19 DIAGNOSIS — E559 Vitamin D deficiency, unspecified: Secondary | ICD-10-CM

## 2024-09-19 DIAGNOSIS — E876 Hypokalemia: Secondary | ICD-10-CM

## 2024-09-19 DIAGNOSIS — I152 Hypertension secondary to endocrine disorders: Secondary | ICD-10-CM

## 2024-09-20 ENCOUNTER — Ambulatory Visit: Payer: Self-pay | Admitting: Family Medicine

## 2024-09-22 LAB — CBC WITH DIFFERENTIAL/PLATELET
Basophils Absolute: 0 x10E3/uL (ref 0.0–0.2)
Basos: 1 %
EOS (ABSOLUTE): 0.1 x10E3/uL (ref 0.0–0.4)
Eos: 1 %
Hematocrit: 36.7 % (ref 34.0–46.6)
Hemoglobin: 11.7 g/dL (ref 11.1–15.9)
Immature Grans (Abs): 0 x10E3/uL (ref 0.0–0.1)
Immature Granulocytes: 0 %
Lymphocytes Absolute: 1.4 x10E3/uL (ref 0.7–3.1)
Lymphs: 21 %
MCH: 29.9 pg (ref 26.6–33.0)
MCHC: 31.9 g/dL (ref 31.5–35.7)
MCV: 94 fL (ref 79–97)
Monocytes Absolute: 0.5 x10E3/uL (ref 0.1–0.9)
Monocytes: 8 %
Neutrophils Absolute: 4.6 x10E3/uL (ref 1.4–7.0)
Neutrophils: 69 %
Platelets: 273 x10E3/uL (ref 150–450)
RBC: 3.91 x10E6/uL (ref 3.77–5.28)
RDW: 12.7 % (ref 11.7–15.4)
WBC: 6.6 x10E3/uL (ref 3.4–10.8)

## 2024-09-22 LAB — COMPREHENSIVE METABOLIC PANEL WITH GFR
ALT: 8 IU/L (ref 0–32)
AST: 27 IU/L (ref 0–40)
Albumin: 3.7 g/dL (ref 3.7–4.7)
Alkaline Phosphatase: 72 IU/L (ref 48–129)
BUN/Creatinine Ratio: 16 (ref 12–28)
BUN: 15 mg/dL (ref 8–27)
Bilirubin Total: 0.3 mg/dL (ref 0.0–1.2)
CO2: 17 mmol/L — ABNORMAL LOW (ref 20–29)
Calcium: 8.9 mg/dL (ref 8.7–10.3)
Chloride: 105 mmol/L (ref 96–106)
Creatinine, Ser: 0.92 mg/dL (ref 0.57–1.00)
Globulin, Total: 3.3 g/dL (ref 1.5–4.5)
Glucose: 91 mg/dL (ref 70–99)
Potassium: 4.7 mmol/L (ref 3.5–5.2)
Sodium: 141 mmol/L (ref 134–144)
Total Protein: 7 g/dL (ref 6.0–8.5)
eGFR: 61 mL/min/1.73 (ref >59)

## 2024-09-22 LAB — LIPID PANEL
Chol/HDL Ratio: 2.4 ratio (ref 0.0–4.4)
Cholesterol, Total: 88 mg/dL — ABNORMAL LOW (ref 100–199)
HDL: 36 mg/dL — ABNORMAL LOW (ref >39)
LDL Chol Calc (NIH): 30 mg/dL (ref 0–99)
Triglycerides: 119 mg/dL (ref 0–149)
VLDL Cholesterol Cal: 22 mg/dL (ref 5–40)

## 2024-09-22 LAB — MAGNESIUM: Magnesium: 1.7 mg/dL (ref 1.6–2.3)

## 2024-09-22 LAB — VITAMIN D 25 HYDROXY (VIT D DEFICIENCY, FRACTURES): Vit D, 25-Hydroxy: 26.4 ng/mL — ABNORMAL LOW (ref 30.0–100.0)

## 2024-10-26 ENCOUNTER — Ambulatory Visit: Payer: Self-pay

## 2024-10-26 DIAGNOSIS — E876 Hypokalemia: Secondary | ICD-10-CM

## 2024-10-26 NOTE — Telephone Encounter (Signed)
Lab orders in. Thank you!

## 2024-10-26 NOTE — Telephone Encounter (Signed)
 FYI Only or Action Required?: Action required by provider: clinical question for provider and update on patient condition.  Patient was last seen in primary care on 07/31/2024 by Chandra Toribio POUR, MD.  Called Nurse Triage reporting Vomiting and Diarrhea.  Symptoms began several days ago.  Interventions attempted: Rest, hydration, or home remedies.  Symptoms are: stable.  Triage Disposition: See Physician Within 24 Hours  Patient/caregiver understands and will follow disposition?: No, wishes to speak with PCP          Copied from CRM #[id number]. Topic: Clinical - Red Word Triage >> Oct 26, 2024  9:05 AM Zy'onna H wrote: [facility] that prompted transfer to Nurse Triage: Patient is having the following symptoms:  - Vomiting -Diarrhea - Looking to get Lab work due to mineral being depleted.   **Transf. To NT** Reason for Disposition  [1] MODERATE diarrhea (e.g., 4-6 times / day more than normal) AND [2] age > 70 years  Answer Assessment - Initial Assessment Questions Patient states this is a chronic issue she experiences and it has become severe in the past and hospitalized her due to low magnesium /potassium/calcium . She would like to have Dr Chandra order labs for her to have those levels checked and keep her appt with him on 10/31/24. Offered acute visit in office today with available provider and patient declined and states it would be a waste of time and since Dr Chandra knows her, if he can just send lab orders she would prefer that.   1. DIARRHEA SEVERITY: How bad is the diarrhea? How many more stools have you had in the past 24 hours than normal?      She states it has let up some, no diarrhea in the past hour. She states already this morning there have been 4 episodes of diarrhea. Yesterday she states there were 6-7 episodes of diarrhea. She states her baseline is 2-3 Bms per day.  2. ONSET: When did the diarrhea begin?      Yesterday.  3. STOOL DESCRIPTION:  How loose or  watery is the diarrhea? What is the stool color? Is there any blood or mucous in the stool?     Denies blood or mucous. She states it has been both watery and loose/formation. Yellow foam was the most recent BM.  4. VOMITING: Are you also vomiting? If Yes, ask: How many times in the past 24 hours?      Vomited on 10/24/24, no vomiting yesterday or today.  5. ABDOMEN PAIN: Are you having any abdomen pain? If Yes, ask: What does it feel like? (e.g., crampy, dull, intermittent, constant)      No.  6. ABDOMEN PAIN SEVERITY: If present, ask: How bad is the pain?  (e.g., Scale 1-10; mild, moderate, or severe)     No.  7. ORAL INTAKE: If vomiting, Have you been able to drink liquids? How much liquids have you had in the past 24 hours?     She states she has just drank 12 oz pedialyte and 6 oz of water.  8. HYDRATION: Any signs of dehydration? (e.g., dry mouth [not just dry lips], too weak to stand, dizziness, new weight loss) When did you last urinate?     Urinating this morning. No signs of dehydration, she states she felt bad yesterday but feels much better today.  9. EXPOSURE: Have you traveled to a foreign country recently? Have you been exposed to anyone with diarrhea? Could you have eaten any food that was spoiled?  No.  10. ANTIBIOTIC USE: Are you taking antibiotics now or have you taken antibiotics in the past 2 months?       She states she takes Flagyl  daily.  11. OTHER SYMPTOMS: Do you have any other symptoms? (e.g., fever, blood in stool)       No fever.  Protocols used: [facility]

## 2024-10-26 NOTE — Telephone Encounter (Signed)
 Appointment schedule

## 2024-10-26 NOTE — Telephone Encounter (Signed)
 Based on triage note, it appears patient is feeling better and recovering from what sounds like a GI bug.  I have read up on her history and see that low electrolytes have been an ongoing problem for her. I see she already has an office visit with Dr. Chandra on 10/31/24.   I am fine ordering these labs for her if she can come in tomorrow morning to get them done. Please schedule her for a lab appt tomorrow and also tell her to keep hydrating with pedialyte and water and to continue taking her supplements.  If the diarrhea get worse, or if she starts vomiting again, or if she starts feeling worse instead of better, I want her to go to the ER immediately.

## 2024-10-26 NOTE — Telephone Encounter (Signed)
 Called patient she stated that she is feeling better today pt is agreeable to have lab drawn tomorrow. I will put her on the schedule once labs are in.

## 2024-10-27 ENCOUNTER — Other Ambulatory Visit

## 2024-10-27 DIAGNOSIS — E876 Hypokalemia: Secondary | ICD-10-CM

## 2024-10-28 LAB — BASIC METABOLIC PANEL WITH GFR
BUN/Creatinine Ratio: 15 (ref 12–28)
BUN: 11 mg/dL (ref 8–27)
CO2: 25 mmol/L (ref 20–29)
Calcium: 7.3 mg/dL — ABNORMAL LOW (ref 8.7–10.3)
Chloride: 105 mmol/L (ref 96–106)
Creatinine, Ser: 0.72 mg/dL (ref 0.57–1.00)
Glucose: 122 mg/dL — ABNORMAL HIGH (ref 70–99)
Potassium: 3.7 mmol/L (ref 3.5–5.2)
Sodium: 142 mmol/L (ref 134–144)
eGFR: 81 mL/min/1.73 (ref >59)

## 2024-10-28 LAB — MAGNESIUM: Magnesium: 1 mg/dL — ABNORMAL LOW (ref 1.6–2.3)

## 2024-10-31 ENCOUNTER — Ambulatory Visit (INDEPENDENT_AMBULATORY_CARE_PROVIDER_SITE_OTHER): Admitting: Family Medicine

## 2024-10-31 ENCOUNTER — Encounter: Payer: Self-pay | Admitting: Family Medicine

## 2024-10-31 DIAGNOSIS — K660 Peritoneal adhesions (postprocedural) (postinfection): Secondary | ICD-10-CM

## 2024-10-31 DIAGNOSIS — F419 Anxiety disorder, unspecified: Secondary | ICD-10-CM

## 2024-10-31 MED ORDER — MAGNESIUM SULFATE 2 GM/50ML IV SOLN: INTRAVENOUS | Status: AC

## 2024-10-31 MED ORDER — LORAZEPAM 1 MG PO TABS: ORAL_TABLET | ORAL | 0 refills | Status: AC

## 2024-10-31 NOTE — Assessment & Plan Note (Addendum)
 Secondary to chronic malabsorption with acute worsening from recent vomiting from partial small bowel obstruction. Magnesium  level was 1.0 on labs drawn last Friday (the day after a vomiting episode). Reports significant shakiness. Calcium  was also noted to be low, likely secondary to hypomagnesemia. - Repeat magnesium  level today. - Start magnesium  infusions. - Advised if magnesium  level today is <1.0, to go to the emergency department for immediate IV infusion. - Advised to go to the emergency department if symptoms persist despite magnesium  infusions, as that would be the quickest way to receive an infusion.

## 2024-10-31 NOTE — Progress Notes (Signed)
 Established Patient Office Visit  Subjective   Patient ID: Andrea [facility], female    DOB: [birth year]  Age: 88 y.o. MRN: [id number]  Chief Complaint  Patient presents with   Medical Management of Chronic Issues    HPI  Subjective - Reports two incidents of vomiting since Thanksgiving. First episode was the day after Thanksgiving, second was two weeks later. - Describes bloating and feeling like food is not passing through. Abdomen becomes hard. - Woke up around 1:00 AM with discomfort during the first episode, followed by vomiting at 2:00 AM which continued through the night and into the next day. - The second episode also involved vomiting, but was accompanied by diarrhea. - Reports this is a chronic, ongoing issue. - Does not feel nauseous before vomiting, but feels the need to vomit. Denies self-inducing vomiting. - After vomiting episodes, tries to start with liquids like Pedialyte. - Reports feeling distress after the evening meal, describing it as food building up and feeling heavy. Breakfast is the largest meal of the day. - Describes dinner as typically small, consisting of soft foods like salmon patties, stewed potatoes, and boiled carrots. - Reports feeling very shaky, to the point of being unable to hold a coffee cup this morning.  Medications Magnesium  gummies, reports they are not as well tolerated as previously and cause stomach pain.  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 History of vomiting fecal matter during hospitalization in September. PSH: Reports multiple surgeries including a cholecystectomy and hysterectomy, both done via open procedure (the old-fashioned way). Reports one abdominal surgery in 2022 where pneumatosis was diagnosed. Clinician noted past surgeries for obstructions or adhesions from hospital discharge summary in September, but patient is unsure if she has had  surgery specifically for adhesions.  ROS GI: Positive for bloating, vomiting, and diarrhea. Denies nausea. Denies esophageal-type problem. Vomited fecal matter in September. Constitutional: Positive for shakiness.     The ASCVD Risk score (Arnett DK, et al., 2019) failed to calculate for the following reasons:   The 2019 ASCVD risk score is only valid for ages 23 to 84   Risk score cannot be calculated because patient has a medical history suggesting prior/existing ASCVD  Health Maintenance Due  Topic Date Due   Pneumococcal Vaccine: 50+ Years (3 of 3 - PCV20 or PCV21) 11/30/2017   OPHTHALMOLOGY EXAM  09/15/2023   COVID-19 Vaccine (4 - 2025-26 season) 07/24/2024      Objective:     BP (!) 153/66   Pulse (!) 57   Ht 5' 3 (1.6 m)   BMI 23.31 kg/m    Physical Exam Gen: alert, oriented Pulm: no respiratory distress Psych: pleasant affect   No results found for any visits on 10/31/24.      Assessment & Plan:   Hypomagnesemia Assessment & Plan: Secondary to chronic malabsorption with acute worsening from recent vomiting from partial small bowel obstruction. Magnesium  level was 1.0 on labs drawn last Friday (the day after a vomiting episode). Reports significant shakiness. Calcium  was also noted to be low, likely secondary to hypomagnesemia. - Repeat magnesium  level today. - Start magnesium  infusions. - Advised if magnesium  level today is <1.0, to go to the emergency department for immediate IV infusion. - Advised to go to the emergency department if symptoms persist despite magnesium  infusions, as that would be the quickest way to receive an infusion.  Orders: -     Magnesium  -  Basic metabolic panel with GFR  Anxiety -     LORazepam ; TAKE 1/2 TO 1 TABLET AS    NEEDED FOR ANXIETY (MAXIMUM ONCE DAILY)  Dispense: 30 tablet; Refill: 0  Hypocalcemia -     Magnesium  -     Basic metabolic panel with GFR  Small bowel adhesions Assessment & Plan: History of  multiple abdominal surgeries (cholecystectomy, hysterectomy) likely causing adhesions and scar tissue, leading to episodes of partial obstruction. Also has underlying dysmotility. Presents with recurrent episodes of bloating and vomiting, feeling like food gets stuck. One recent episode was associated with diarrhea. Denies nausea. Last Friday, labs showed a low magnesium  level of 1.0, likely due to vomiting. Also reports feeling shaky. - Repeat magnesium  level today. - Will arrange for magnesium  infusions, likely once a week to start. - Continue oral magnesium  until infusions can be scheduled. - Advised to eat smaller, more frequent meals.   Other orders -     Magnesium  Sulfate; Infuse 2 grams IV at a rate of 1 gram per hour     Return in about 3 months (around 01/29/2025) for hypomagnesemia.    Toribio MARLA Slain, MD

## 2024-10-31 NOTE — Assessment & Plan Note (Signed)
 History of multiple abdominal surgeries (cholecystectomy, hysterectomy) likely causing adhesions and scar tissue, leading to episodes of partial obstruction. Also has underlying dysmotility. Presents with recurrent episodes of bloating and vomiting, feeling like food gets stuck. One recent episode was associated with diarrhea. Denies nausea. Last Friday, labs showed a low magnesium  level of 1.0, likely due to vomiting. Also reports feeling shaky. - Repeat magnesium  level today. - Will arrange for magnesium  infusions, likely once a week to start. - Continue oral magnesium  until infusions can be scheduled. - Advised to eat smaller, more frequent meals.

## 2024-10-31 NOTE — Patient Instructions (Signed)
 It was nice to see you today,  We addressed the following topics today: - Please keep trying to take the magnesium  gummies orally until we can get the infusions set up. - I will have the lab draw your magnesium  level again today. - If your magnesium  level comes back at less than 1.0, please go to the emergency department for an IV infusion. - If you continue to feel very shaky or your symptoms do not improve after you start the magnesium  infusions, please go to the emergency department. - I will work on getting the magnesium  infusions scheduled for you. I will try to make it urgent, but it may take a week or two to get you an appointment.  Have a great day,  Rolan Slain, MD

## 2024-11-01 ENCOUNTER — Ambulatory Visit: Payer: Self-pay | Admitting: Family Medicine

## 2024-11-01 ENCOUNTER — Other Ambulatory Visit (HOSPITAL_COMMUNITY): Payer: Self-pay | Admitting: Family Medicine

## 2024-11-01 LAB — BASIC METABOLIC PANEL WITH GFR
BUN/Creatinine Ratio: 9 — ABNORMAL LOW (ref 12–28)
BUN: 8 mg/dL (ref 8–27)
CO2: 25 mmol/L (ref 20–29)
Calcium: 7.5 mg/dL — ABNORMAL LOW (ref 8.7–10.3)
Chloride: 103 mmol/L (ref 96–106)
Creatinine, Ser: 0.85 mg/dL (ref 0.57–1.00)
Glucose: 110 mg/dL — ABNORMAL HIGH (ref 70–99)
Potassium: 3.9 mmol/L (ref 3.5–5.2)
Sodium: 141 mmol/L (ref 134–144)
eGFR: 67 mL/min/1.73 (ref >59)

## 2024-11-01 LAB — MAGNESIUM: Magnesium: 1 mg/dL — ABNORMAL LOW (ref 1.6–2.3)

## 2024-11-03 ENCOUNTER — Telehealth (HOSPITAL_COMMUNITY): Payer: Self-pay | Admitting: Family Medicine

## 2024-11-03 NOTE — Telephone Encounter (Signed)
 Patient referred to infusion pharmacy team for ambulatory infusion of Magnesium  Sulfate IV.  Insurance - Stage Manager of care - Site of care: [facility] Dx code - [phone] Therapy - Pt will receive Magnesium  2 gm IV weekly with Magnesium  level drawn q4 weeks. Provider's office will notify the infusion center if more frequent dosing is needed.  Infusion appointments - Scheduling team will schedule patient as soon as possible.    Thank you,  Norton Blush, PharmD, [facility]

## 2024-11-07 ENCOUNTER — Inpatient Hospital Stay (HOSPITAL_COMMUNITY)
Admission: RE | Admit: 2024-11-07 | Discharge: 2024-11-07 | Disposition: A | Source: Ambulatory Visit | Attending: Family Medicine

## 2024-11-07 LAB — MAGNESIUM: Magnesium: 1.8 mg/dL (ref 1.7–2.4)

## 2024-11-07 MED ORDER — MAGNESIUM SULFATE 2 GM/50ML IV SOLN
2.0000 g | Freq: Once | INTRAVENOUS | Status: AC
  Administered 2024-11-07: 10:00:00 2 g via INTRAVENOUS
  Filled 2024-11-07: qty 50

## 2024-11-08 ENCOUNTER — Telehealth: Payer: Self-pay

## 2024-11-08 ENCOUNTER — Other Ambulatory Visit: Payer: Self-pay | Admitting: Family Medicine

## 2024-11-08 NOTE — Telephone Encounter (Signed)
 Copied from CRM #[id number]. Topic: Clinical - Request for Lab/Test Order >> Nov 08, 2024 10:01 AM Eva FALCON wrote: Reason for CRM: Ms. Kutsch is calling in and states she went to get an infusion yesterday at the hospital and labs to check her magnisium were done. She says she was advise by the doctor there to have Dr. Chandra send lab orders so she could get them done when she gets her infusions. But she also mentioned something about him only sending them once a month. She doesn't think her lab for 12/23 is necessary could someone reach out and confirm if this is still needed? And if Dr. Chandra could just send lab orders so she can get them done when she does the infusion appointments?

## 2024-11-08 NOTE — Telephone Encounter (Signed)
 Yes she can cancel the 12/23 lab visit.    I have put in standing orders for monthly magnesium .  I am not sure how else to send in orders to the hospital have them drawn there.

## 2024-11-09 NOTE — Telephone Encounter (Signed)
 Appt cancelled and message sent to the team to verify if your standing order will suffice for the magnesium  check.

## 2024-11-09 NOTE — Telephone Encounter (Signed)
 Team returned message and they have it set up for pt to have this done every 4 weeks

## 2024-11-11 ENCOUNTER — Encounter (HOSPITAL_COMMUNITY): Payer: Self-pay

## 2024-11-11 ENCOUNTER — Emergency Department (HOSPITAL_COMMUNITY)

## 2024-11-11 ENCOUNTER — Other Ambulatory Visit: Payer: Self-pay

## 2024-11-11 ENCOUNTER — Inpatient Hospital Stay (HOSPITAL_COMMUNITY)
Admission: EM | Admit: 2024-11-11 | Discharge: 2024-11-13 | DRG: 389 | Disposition: A | Discharge status: Home / Self Care | Attending: Internal Medicine | Admitting: Internal Medicine

## 2024-11-11 ENCOUNTER — Inpatient Hospital Stay (HOSPITAL_COMMUNITY)

## 2024-11-11 DIAGNOSIS — Z823 Family history of stroke: Secondary | ICD-10-CM | POA: Diagnosis not present

## 2024-11-11 DIAGNOSIS — E1165 Type 2 diabetes mellitus with hyperglycemia: Secondary | ICD-10-CM | POA: Diagnosis present

## 2024-11-11 DIAGNOSIS — Z800 Family history of malignant neoplasm of digestive organs: Secondary | ICD-10-CM | POA: Diagnosis not present

## 2024-11-11 DIAGNOSIS — Z86008 Personal history of in-situ neoplasm of other site: Secondary | ICD-10-CM | POA: Diagnosis not present

## 2024-11-11 DIAGNOSIS — I5032 Chronic diastolic (congestive) heart failure: Secondary | ICD-10-CM | POA: Diagnosis present

## 2024-11-11 DIAGNOSIS — I152 Hypertension secondary to endocrine disorders: Secondary | ICD-10-CM | POA: Diagnosis present

## 2024-11-11 DIAGNOSIS — Z888 Allergy status to other drugs, medicaments and biological substances status: Secondary | ICD-10-CM

## 2024-11-11 DIAGNOSIS — Z90710 Acquired absence of both cervix and uterus: Secondary | ICD-10-CM

## 2024-11-11 DIAGNOSIS — K219 Gastro-esophageal reflux disease without esophagitis: Secondary | ICD-10-CM | POA: Diagnosis present

## 2024-11-11 DIAGNOSIS — I5030 Unspecified diastolic (congestive) heart failure: Secondary | ICD-10-CM | POA: Diagnosis present

## 2024-11-11 DIAGNOSIS — Z7982 Long term (current) use of aspirin: Secondary | ICD-10-CM | POA: Diagnosis not present

## 2024-11-11 DIAGNOSIS — K5730 Diverticulosis of large intestine without perforation or abscess without bleeding: Secondary | ICD-10-CM | POA: Diagnosis present

## 2024-11-11 DIAGNOSIS — K6389 Other specified diseases of intestine: Secondary | ICD-10-CM | POA: Diagnosis present

## 2024-11-11 DIAGNOSIS — I252 Old myocardial infarction: Secondary | ICD-10-CM

## 2024-11-11 DIAGNOSIS — R109 Unspecified abdominal pain: Secondary | ICD-10-CM | POA: Diagnosis present

## 2024-11-11 DIAGNOSIS — F419 Anxiety disorder, unspecified: Secondary | ICD-10-CM | POA: Diagnosis present

## 2024-11-11 DIAGNOSIS — Z860100 Personal history of colon polyps, unspecified: Secondary | ICD-10-CM

## 2024-11-11 DIAGNOSIS — K565 Intestinal adhesions [bands], unspecified as to partial versus complete obstruction: Secondary | ICD-10-CM | POA: Diagnosis present

## 2024-11-11 DIAGNOSIS — K920 Hematemesis: Secondary | ICD-10-CM | POA: Diagnosis present

## 2024-11-11 DIAGNOSIS — Z9049 Acquired absence of other specified parts of digestive tract: Secondary | ICD-10-CM | POA: Diagnosis not present

## 2024-11-11 DIAGNOSIS — Z803 Family history of malignant neoplasm of breast: Secondary | ICD-10-CM | POA: Diagnosis not present

## 2024-11-11 DIAGNOSIS — Z79899 Other long term (current) drug therapy: Secondary | ICD-10-CM

## 2024-11-11 DIAGNOSIS — Z8349 Family history of other endocrine, nutritional and metabolic diseases: Secondary | ICD-10-CM

## 2024-11-11 DIAGNOSIS — Z87891 Personal history of nicotine dependence: Secondary | ICD-10-CM | POA: Diagnosis not present

## 2024-11-11 DIAGNOSIS — Z8249 Family history of ischemic heart disease and other diseases of the circulatory system: Secondary | ICD-10-CM | POA: Diagnosis not present

## 2024-11-11 DIAGNOSIS — K56609 Unspecified intestinal obstruction, unspecified as to partial versus complete obstruction: Secondary | ICD-10-CM | POA: Diagnosis not present

## 2024-11-11 DIAGNOSIS — E785 Hyperlipidemia, unspecified: Secondary | ICD-10-CM | POA: Diagnosis present

## 2024-11-11 DIAGNOSIS — E1159 Type 2 diabetes mellitus with other circulatory complications: Secondary | ICD-10-CM | POA: Diagnosis present

## 2024-11-11 DIAGNOSIS — Z96653 Presence of artificial knee joint, bilateral: Secondary | ICD-10-CM | POA: Diagnosis present

## 2024-11-11 LAB — URINALYSIS, W/ REFLEX TO CULTURE (INFECTION SUSPECTED)
Bilirubin Urine: NEGATIVE
Glucose, UA: NEGATIVE mg/dL
Hgb urine dipstick: NEGATIVE
Ketones, ur: NEGATIVE mg/dL
Leukocytes,Ua: NEGATIVE
Nitrite: NEGATIVE
Protein, ur: NEGATIVE mg/dL
Specific Gravity, Urine: 1.01 (ref 1.005–1.030)
pH: 7.5 (ref 5.0–8.0)

## 2024-11-11 LAB — COMPREHENSIVE METABOLIC PANEL WITH GFR
ALT: 9 U/L (ref 0–44)
AST: 24 U/L (ref 15–41)
Albumin: 3.9 g/dL (ref 3.5–5.0)
Alkaline Phosphatase: 65 U/L (ref 38–126)
Anion gap: 11 (ref 5–15)
BUN: 18 mg/dL (ref 8–23)
CO2: 28 mmol/L (ref 22–32)
Calcium: 9.3 mg/dL (ref 8.9–10.3)
Chloride: 102 mmol/L (ref 98–111)
Creatinine, Ser: 0.96 mg/dL (ref 0.44–1.00)
GFR, Estimated: 58 mL/min — ABNORMAL LOW
Glucose, Bld: 130 mg/dL — ABNORMAL HIGH (ref 70–99)
Potassium: 4.3 mmol/L (ref 3.5–5.1)
Sodium: 141 mmol/L (ref 135–145)
Total Bilirubin: 0.3 mg/dL (ref 0.0–1.2)
Total Protein: 7.5 g/dL (ref 6.5–8.1)

## 2024-11-11 LAB — MAGNESIUM: Magnesium: 1.7 mg/dL (ref 1.7–2.4)

## 2024-11-11 LAB — LIPASE, BLOOD: Lipase: 52 U/L — ABNORMAL HIGH (ref 11–51)

## 2024-11-11 LAB — TYPE AND SCREEN
ABO/RH(D): O POS
Antibody Screen: NEGATIVE

## 2024-11-11 LAB — CBG MONITORING, ED: Glucose-Capillary: 129 mg/dL — ABNORMAL HIGH (ref 70–99)

## 2024-11-11 LAB — CBC WITH DIFFERENTIAL/PLATELET
Abs Immature Granulocytes: 0.02 K/uL (ref 0.00–0.07)
Basophils Absolute: 0 K/uL (ref 0.0–0.1)
Basophils Relative: 0 %
Eosinophils Absolute: 0 K/uL (ref 0.0–0.5)
Eosinophils Relative: 1 %
HCT: 39.8 % (ref 36.0–46.0)
Hemoglobin: 12.5 g/dL (ref 12.0–15.0)
Immature Granulocytes: 0 %
Lymphocytes Relative: 11 %
Lymphs Abs: 1 K/uL (ref 0.7–4.0)
MCH: 29.3 pg (ref 26.0–34.0)
MCHC: 31.4 g/dL (ref 30.0–36.0)
MCV: 93.2 fL (ref 80.0–100.0)
Monocytes Absolute: 0.5 K/uL (ref 0.1–1.0)
Monocytes Relative: 5 %
Neutro Abs: 7.1 K/uL (ref 1.7–7.7)
Neutrophils Relative %: 83 %
Platelets: 253 K/uL (ref 150–400)
RBC: 4.27 MIL/uL (ref 3.87–5.11)
RDW: 14.4 % (ref 11.5–15.5)
WBC: 8.6 K/uL (ref 4.0–10.5)
nRBC: 0 % (ref 0.0–0.2)

## 2024-11-11 LAB — I-STAT CG4 LACTIC ACID, ED
Lactic Acid, Venous: 2 mmol/L (ref 0.5–1.9)
Lactic Acid, Venous: 1.3 mmol/L (ref 0.5–1.9)

## 2024-11-11 MED ORDER — DIATRIZOATE MEGLUMINE & SODIUM 66-10 % PO SOLN
90.0000 mL | Freq: Once | ORAL | Status: AC
  Administered 2024-11-11: 90 mL via NASOGASTRIC
  Filled 2024-11-11: qty 90

## 2024-11-11 MED ORDER — SODIUM CHLORIDE 0.9% FLUSH
3.0000 mL | Freq: Two times a day (BID) | INTRAVENOUS | Status: DC
  Administered 2024-11-11 – 2024-11-12 (×3): 3 mL via INTRAVENOUS

## 2024-11-11 MED ORDER — ONDANSETRON HCL 4 MG/2ML IJ SOLN: 4.0000 mg | Freq: Four times a day (QID) | INTRAMUSCULAR | Status: DC | PRN

## 2024-11-11 MED ORDER — ACETAMINOPHEN 650 MG RE SUPP: 650.0000 mg | Freq: Four times a day (QID) | RECTAL | Status: DC | PRN

## 2024-11-11 MED ORDER — SODIUM CHLORIDE 0.9 % IV SOLN: INTRAVENOUS | Status: DC

## 2024-11-11 MED ORDER — ONDANSETRON HCL 4 MG PO TABS: 4.0000 mg | ORAL_TABLET | Freq: Four times a day (QID) | ORAL | Status: DC | PRN

## 2024-11-11 MED ORDER — PANTOPRAZOLE SODIUM 40 MG IV SOLR
40.0000 mg | Freq: Two times a day (BID) | INTRAVENOUS | Status: DC
  Administered 2024-11-11 – 2024-11-13 (×4): 40 mg via INTRAVENOUS
  Filled 2024-11-11 (×4): qty 10

## 2024-11-11 MED ORDER — ACETAMINOPHEN 325 MG PO TABS: 650.0000 mg | ORAL_TABLET | Freq: Four times a day (QID) | ORAL | Status: DC | PRN

## 2024-11-11 MED ORDER — METRONIDAZOLE 500 MG/100ML IV SOLN
500.0000 mg | Freq: Every day | INTRAVENOUS | Status: DC
  Administered 2024-11-11 – 2024-11-13 (×3): 500 mg via INTRAVENOUS
  Filled 2024-11-11 (×3): qty 100

## 2024-11-11 MED ORDER — MORPHINE SULFATE (PF) 2 MG/ML IV SOLN
2.0000 mg | Freq: Once | INTRAVENOUS | Status: AC
  Administered 2024-11-11: 2 mg via INTRAVENOUS
  Filled 2024-11-11: qty 1

## 2024-11-11 MED ORDER — METRONIDAZOLE IVPB CUSTOM: 250.0000 mg | Freq: Every day | INTRAVENOUS | Status: DC

## 2024-11-11 MED ORDER — MORPHINE SULFATE (PF) 2 MG/ML IV SOLN: 2.0000 mg | INTRAVENOUS | Status: DC | PRN

## 2024-11-11 MED ORDER — PANTOPRAZOLE SODIUM 40 MG IV SOLR
40.0000 mg | Freq: Once | INTRAVENOUS | Status: AC
  Administered 2024-11-11: 40 mg via INTRAVENOUS
  Filled 2024-11-11: qty 10

## 2024-11-11 MED ORDER — PHENOL 1.4 % MT LIQD
1.0000 | OROMUCOSAL | Status: DC | PRN
  Administered 2024-11-11: 1 via OROMUCOSAL
  Filled 2024-11-11: qty 177

## 2024-11-11 MED ORDER — ALBUTEROL SULFATE (2.5 MG/3ML) 0.083% IN NEBU: 2.5000 mg | INHALATION_SOLUTION | Freq: Four times a day (QID) | RESPIRATORY_TRACT | Status: DC | PRN

## 2024-11-11 MED ORDER — LACTATED RINGERS IV BOLUS
500.0000 mL | Freq: Once | INTRAVENOUS | Status: AC
  Administered 2024-11-11: 500 mL via INTRAVENOUS

## 2024-11-11 MED ORDER — ONDANSETRON HCL 4 MG/2ML IJ SOLN
4.0000 mg | Freq: Once | INTRAMUSCULAR | Status: AC
  Administered 2024-11-11: 4 mg via INTRAVENOUS
  Filled 2024-11-11: qty 2

## 2024-11-11 MED ORDER — IOHEXOL 350 MG/ML SOLN
75.0000 mL | Freq: Once | INTRAVENOUS | Status: AC | PRN
  Administered 2024-11-11: 75 mL via INTRAVENOUS

## 2024-11-11 MED ORDER — HYDRALAZINE HCL 20 MG/ML IJ SOLN: 10.0000 mg | INTRAMUSCULAR | Status: DC | PRN

## 2024-11-11 MED ORDER — MENTHOL 3 MG MT LOZG
1.0000 | LOZENGE | OROMUCOSAL | Status: DC | PRN
  Administered 2024-11-11: 3 mg via ORAL
  Filled 2024-11-11: qty 9

## 2024-11-11 MED ORDER — LORAZEPAM 2 MG/ML IJ SOLN
0.5000 mg | Freq: Four times a day (QID) | INTRAMUSCULAR | Status: DC | PRN
  Administered 2024-11-11: 0.5 mg via INTRAVENOUS
  Filled 2024-11-11: qty 1

## 2024-11-11 NOTE — Progress Notes (Signed)
 Completed gastrograffin at 2245 by NG tube ,notified the radiology department .

## 2024-11-11 NOTE — ED Triage Notes (Signed)
 Pt c.o dark brown emesis since this morning, denies abd pain or diarrhea, Pt states she was here in September for septic shock.

## 2024-11-11 NOTE — ED Notes (Signed)
 Called main lab to add on lipase. KIT

## 2024-11-11 NOTE — H&P (Signed)
 " History and Physical    Patient: Andrea Brooks FMW:[id number] DOB: 03-31-1936 DOA: 11/11/2024 DOS: the patient was seen and examined on 11/11/2024 PCP: Chandra Toribio POUR, MD  Patient coming from: Home  Chief Complaint:  Chief Complaint  Patient presents with   Abdominal pain with nausea and vomiting   HPI: Andrea Brooks is a 88 y.o. female with medical history significant of hypertension, hyperlipidemia, diabetes mellitus type 2, pneumatosis  intestinalis cystoides with prior history of small bowel obstruction, GERD, and obesity presents with complaints of abdominal pain with nausea and vomiting.  She has experienced a weight loss of five pounds since November 06, 2024, and has been having difficulty eating, with her last substantial meal being on Thanksgiving.  She maintains an appetite, but is only able to have small meals here and there.  Recently she has not had a bowel movement since the day before yesterday, which was diarrhea.  She has a history of pneumatosis intestinalis cystoides and has been treated at [facility]. She has been on Flagyl  and Mestinon , which has been somewhat successful.  She is had several abdominal surgeries previously. She has experienced recurrent episodes of vomiting and diarrhea since 2019 or 2020, following an E. coli infection from [employer].  She experiences episodes of vomiting, with the vomit described as brown in color, but no blood. She has been on Mestinon  to aid bowel movement due to a 'lazy' small intestine, but was recently taken off it to observe her response. She reports no urge to have a bowel movement since discontinuing Mestinon .  Her symptoms include abdominal swelling, which she associates with impending diarrhea or vomiting. She has experienced weakness due to her condition but remains active when feeling well, performing daily activities such as making her bed and cooking, needing some assistance for balance from time to time.  In  the emergency department patient was noted to be afebrile with pulse 92-102 all other vital signs relatively maintained.  Labs noted initial  lactic acid 2 and lipase 52.  Chest x-ray showed no acute abnormality.  Subsequent CT scan of the abdomen pelvis noted a small bowel obstruction secondary to multiple areas of small bowel adhesions and sigmoid diverticulosis.  General surgery had been consulted.  Patient was given 500 mL of lactated Ringer 's, Zofran , morphine , and Protonix  40 mg IV.  Review of Systems: As mentioned in the history of present illness. All other systems reviewed and are negative. 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   Left forearm and Left forearm superior   Past Surgical History:  Procedure Laterality Date   ABDOMINAL HYSTERECTOMY     total   BREAST BIOPSY Left    neg   CHOLECYSTECTOMY     JOINT REPLACEMENT Bilateral    knee   LAPAROTOMY N/A 03/11/2021   Procedure: EXPLORATORY LAPAROTOMY;  Surgeon: Signe Mitzie LABOR, MD;  Location: [facility] OR;  Service: General;  Laterality: N/A;   LEFT HEART CATH AND CORONARY ANGIOGRAPHY N/A 07/04/2018   Procedure: LEFT HEART CATH AND CORONARY ANGIOGRAPHY;  Surgeon: Dann Candyce RAMAN, MD;  Location: [facility] INVASIVE CV LAB;  Service: Cardiovascular;  Laterality: N/A;   REPLACEMENT TOTAL KNEE BILATERAL     Social History:  reports that she quit smoking about 65 years ago. Her smoking use included cigarettes. She started smoking about 67 years ago. She has a 2 pack-year smoking history. She has been exposed to tobacco smoke. She has never used smokeless tobacco. She reports that  she does not drink alcohol and does not use drugs.  Allergies[1]  Family History  Problem Relation Age of Onset   Hypertension Mother    Colon cancer Mother    Stroke Father    Heart failure Father    Stroke Sister    Stroke Brother    Graves' disease Daughter    Breast cancer Paternal Aunt    Graves' disease Sister    Bone cancer Sister    Breast cancer Maternal Aunt    Esophageal cancer Neg Hx    Rectal cancer Neg Hx    Liver cancer Neg Hx     Prior to Admission medications  Medication Sig Start Date End Date Taking? Authorizing Provider  acetaminophen  (TYLENOL ) 500 MG tablet Take 1 tablet (500 mg total) by mouth every 8 (eight) hours as needed for moderate pain. 08/09/23   Singh, Prashant K, MD  aspirin  EC 81 MG tablet Take 81 mg by mouth daily.    [provider]  calcium  carbonate (TUMS - DOSED IN MG ELEMENTAL CALCIUM ) 500 MG chewable tablet Chew 2 tablets (400 mg of elemental calcium  total) by mouth daily as needed for indigestion or heartburn. 07/24/24   Odell Celinda Balo, MD  furosemide  (LASIX ) 20 MG tablet Take 1 tablet (20 mg total) by mouth daily. 07/31/24 07/31/25  Chandra Toribio POUR, MD  LORazepam  (ATIVAN ) 1 MG tablet TAKE 1/2 TO 1 TABLET AS    NEEDED FOR ANXIETY (MAXIMUM ONCE DAILY) 10/31/24   Chandra Toribio POUR, MD  Magnesium  200 MG CHEW Chew 400 mg by mouth 2 (two) times daily. 07/16/24   Wouk, Devaughn Sayres, MD  magnesium  sulfate 2 GM/50ML SOLN infusion Infuse 2 grams IV at a rate of 1 gram per hour 10/31/24   Chandra Toribio POUR, MD  metoprolol  tartrate (LOPRESSOR ) 25 MG tablet Take 0.5 tablets (12.5 mg total) by mouth 2 (two) times daily. 08/03/24   West, Katlyn D, NP  metroNIDAZOLE  (FLAGYL ) 250 MG tablet Take 250 mg by mouth every morning.    [provider]  Multiple Vitamins-Minerals (MULTI FOR HER 50+) TABS Take 1 tablet by mouth daily.    [provider]  polyethylene glycol powder (GLYCOLAX /MIRALAX ) 17 GM/SCOOP powder Take 8.5 g by mouth as needed  for moderate constipation.    [provider]  pyridostigmine  (MESTINON ) 60 MG tablet Take 30 mg by mouth daily. 02/03/22   [provider]  simvastatin  (ZOCOR ) 20 MG tablet Take 1 tablet (20 mg total) by mouth at bedtime. 01/19/24   Hilty, Vinie BROCKS, MD  valsartan  (DIOVAN ) 320 MG tablet Take 1 tablet (320 mg total) by mouth daily. 01/19/24   [name], MD    Physical Exam: Vitals:   11/11/24 0951 11/11/24 0956 11/11/24 0957 11/11/24 1145  BP: 115/84   (!) 123/93  Pulse: (!) 102   92  Resp:  14    Temp: 97.6 F (36.4 C)     SpO2:   99% 100%  Weight:  55.3 kg    Height:  5' 3 (1.6 m)  Constitutional: Elderly female, NAD, calm, comfortable Eyes: PERRL, lids and conjunctivae normal ENMT: Mucous membranes are moist. Normal dentition.  Neck: normal, supple, no masses,   Respiratory: clear to auscultation bilaterally, no wheezing, no crackles. Normal respiratory effort. No accessory muscle use.  Cardiovascular: Regular rate and rhythm, no murmurs / rubs / gallops.  2+ pedal pulses.   Abdomen: Distended abdomen with decreased bowel sounds. Musculoskeletal: no clubbing / cyanosis. No joint deformity upper and lower extremities. Good ROM, no contractures. Normal muscle tone.  Skin: no rashes, lesions, ulcers. No induration Neurologic: CN 2-12 grossly intact. Strength 5/5 in all 4.  Psychiatric: Normal judgment and insight. Alert and oriented x 3. Normal mood.   Data Reviewed:  Reviewed labs, imaging, and pertinent records as documented.  Assessment and Plan:  Small bowel obstruction Patient presents with complaints of abdominal pain with nausea and vomiting and reports of diarrhea earlier this week.  CT scan of the abdomen pelvis concerning for small bowel obstruction related with adhesions.  Patient with history of multiple abdominal surgeries.  General surgery was consulted and recommended NG tube placement to low intermittent suction. - Admit to a telemetry  bed - Monitor intake and output - NGT to suction - IV fluids - Protonix  IV twice daily - Antiemetics as needed - Appreciate GI consultative services we will follow-up for any further recommendations  Pneumatosis intestinalis cystoides Patient followed at [facility] in regards to this issue.  Patient has had several abdominal surgeries.  He reports being on metronidazole  for the last 2 years along with Mestinon  which was recently discontinued by the provider at [facility]. -  Continue metronidazole  500 mg IV  p.o.  Heart failure with preserved ejection fraction Patient currently appears euvolemic on physical exam.  Last echocardiogram noted EF to be 60 to 65% with indeterminate diastolic parameters when last checked 07/20/2024.   - Monitor intake and output - Daily weights  Essential hypertension - Home blood pressure medication regimen on hold as n.p.o. resume once medically able - Hydralazine  IV as needed for elevated blood pressure  Hyperlipidemia - Resume statin once able  Anxiety - Continue Ativan  IV as needed  GERD - Continue pharmacy substitution of Protonix  IV for omeprazole   DVT prophylaxis: Lovenox  Advance Care Planning:   Code Status: Full Code    Consults: General Surgery  Family Communication: Daughter and husband updated at bedside  Severity of Illness: The appropriate patient status for this patient is INPATIENT. Inpatient status is judged to be reasonable and necessary in order to provide the required intensity of service to ensure the patient's safety. The patient's presenting symptoms, physical exam findings, and initial radiographic and laboratory data in the context of their chronic comorbidities is felt to place them at high risk for further clinical deterioration. Furthermore, it is not anticipated that the patient will be medically stable for discharge from the hospital within 2 midnights of admission.   * I certify that at the point of admission it is my clinical  judgment that the patient will require inpatient hospital care spanning beyond 2 midnights from the point of admission due to high intensity of service, high risk for further deterioration and high frequency of surveillance required.*  Author: Maximino DELENA Sharps, MD 11/11/2024 1:43 PM  For on call review [email or web address].      [1]  Allergies Allergen Reactions   Tape Other (See Comments)    Skin tears easily.  OK with paper tape.   Magnesium -Containing Compounds Diarrhea and Nausea And Vomiting  Can take Vitamins w/ magnesium  Does not tolerate po magnesium  by itself   "

## 2024-11-11 NOTE — Clinical Note (Incomplete)
 Pt attempted urine with hat but missed it at rn aware

## 2024-11-11 NOTE — Consult Note (Addendum)
 Reason for Consult:SBO Referring Physician: Nidia Mays  Andrea Brooks is an 88 y.o. female.  HPI: 88yo F with PMHx as below has a HX of SB dysmotility known to our service S/P ex lap and LOA for SB pneumatosis and pneumoperitoneum by Dr. Signe 4/22. She was admitted with SBO in August of this year. It resolved without surgery. She reports N/V and abdominal distention on and off since Thanksgiving. It worsened two days ago. She came to the ED today and W/U includes WBC 8.6 and CT A/P shows SBO. I was asked to consult. C/O nausea and distention. No pain currently.  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   Left forearm and Left forearm superior    Past Surgical History:  Procedure Laterality Date   ABDOMINAL HYSTERECTOMY     total   BREAST BIOPSY Left    neg   CHOLECYSTECTOMY     JOINT REPLACEMENT Bilateral    knee   LAPAROTOMY N/A 03/11/2021   Procedure: EXPLORATORY LAPAROTOMY;  Surgeon: Signe Mitzie LABOR, MD;  Location: [facility] OR;  Service: General;  Laterality: N/A;   LEFT HEART CATH AND CORONARY ANGIOGRAPHY N/A 07/04/2018   Procedure: LEFT HEART CATH AND CORONARY ANGIOGRAPHY;  Surgeon: Dann Candyce RAMAN, MD;  Location: [facility] INVASIVE CV LAB;  Service: Cardiovascular;  Laterality: N/A;   REPLACEMENT TOTAL KNEE BILATERAL      Family History  Problem Relation Age of Onset   Hypertension Mother    Colon cancer Mother    Stroke Father    Heart failure Father    Stroke Sister    Stroke Brother    Graves' disease Daughter    Breast cancer Paternal Aunt    Graves' disease Sister    Bone cancer Sister    Breast  cancer Maternal Aunt    Esophageal cancer Neg Hx    Rectal cancer Neg Hx    Liver cancer Neg Hx     Social History:  reports that she quit smoking about 65 years ago. Her smoking use included cigarettes. She started smoking about 67 years ago. She has a 2 pack-year smoking history. She has been exposed to tobacco smoke. She has never used smokeless tobacco. She reports that she does not drink alcohol and does not use drugs.  Allergies: Allergies[1]  Medications: I have reviewed the patient's current medications.  Results for orders placed or performed during the hospital encounter of 11/11/24 (from the past 48 hours)  Comprehensive metabolic panel     Status: Abnormal   Collection Time: 11/11/24  9:58 AM  Result Value Ref Range   Sodium 141 135 - 145 mmol/L   Potassium 4.3 3.5 - 5.1 mmol/L   Chloride 102 98 - 111 mmol/L   CO2 28 22 - 32 mmol/L   Glucose, Bld 130 (H) 70 - 99 mg/dL    Comment: Glucose reference range applies only to samples taken after fasting for at least 8 hours.   BUN 18 8 - 23 mg/dL   Creatinine, Ser 9.03 0.44 - 1.00 mg/dL  Calcium  9.3 8.9 - 10.3 mg/dL   Total Protein 7.5 6.5 - 8.1 g/dL   Albumin 3.9 3.5 - 5.0 g/dL   AST 24 15 - 41 U/L   ALT 9 0 - 44 U/L   Alkaline Phosphatase 65 38 - 126 U/L   Total Bilirubin 0.3 0.0 - 1.2 mg/dL   GFR, Estimated 58 (L) >60 mL/min    Comment: (NOTE) Calculated using the CKD-EPI Creatinine Equation (2021)    Anion gap 11 5 - 15    Comment: Performed at [facility], [address] [place], [place] [address]  CBC with Differential     Status: None   Collection Time: 11/11/24  9:58 AM  Result Value Ref Range   WBC 8.6 4.0 - 10.5 K/uL   RBC 4.27 3.87 - 5.11 MIL/uL   Hemoglobin 12.5 12.0 - 15.0 g/dL   HCT 60.1 63.9 - 53.9 %   MCV 93.2 80.0 - 100.0 fL   MCH 29.3 26.0 - 34.0 pg   MCHC 31.4 30.0 - 36.0 g/dL   RDW 85.5 88.4 - 84.4 %   Platelets 253 150 - 400 K/uL   nRBC 0.0 0.0 - 0.2 %   Neutrophils Relative % 83 %    Neutro Abs 7.1 1.7 - 7.7 K/uL   Lymphocytes Relative 11 %   Lymphs Abs 1.0 0.7 - 4.0 K/uL   Monocytes Relative 5 %   Monocytes Absolute 0.5 0.1 - 1.0 K/uL   Eosinophils Relative 1 %   Eosinophils Absolute 0.0 0.0 - 0.5 K/uL   Basophils Relative 0 %   Basophils Absolute 0.0 0.0 - 0.1 K/uL   Immature Granulocytes 0 %   Abs Immature Granulocytes 0.02 0.00 - 0.07 K/uL    Comment: Performed at [facility], [address], [place], [place] [address]  POC CBG, ED     Status: Abnormal   Collection Time: 11/11/24 10:00 AM  Result Value Ref Range   Glucose-Capillary 129 (H) 70 - 99 mg/dL    Comment: Glucose reference range applies only to samples taken after fasting for at least 8 hours.  Type and screen [facility]     Status: None   Collection Time: 11/11/24 10:03 AM  Result Value Ref Range   ABO/RH(D) O POS    Antibody Screen NEG    Sample Expiration      11/14/2024,2359 Performed at [facility], [address] [place], [place] [address]   I-Stat Lactic Acid, ED     Status: Abnormal   Collection Time: 11/11/24 10:12 AM  Result Value Ref Range   Lactic Acid, Venous 2.0 (HH) 0.5 - 1.9 mmol/L  I-Stat Lactic Acid, ED     Status: None   Collection Time: 11/11/24 12:19 PM  Result Value Ref Range   Lactic Acid, Venous 1.3 0.5 - 1.9 mmol/L  Lipase, blood     Status: Abnormal   Collection Time: 11/11/24 12:23 PM  Result Value Ref Range   Lipase 52 (H) 11 - 51 U/L    Comment: Performed at [facility], [address] [place], [place] [address]    CT ABDOMEN PELVIS W CONTRAST Result Date: 11/11/2024 CLINICAL DATA:  Concern for bowel obstruction. EXAM: CT ABDOMEN AND PELVIS WITH CONTRAST TECHNIQUE: Multidetector CT imaging of the abdomen and pelvis was performed using the standard protocol following bolus administration of intravenous contrast. RADIATION DOSE REDUCTION: This exam was performed according to the departmental dose-optimization  program  which includes automated exposure control, adjustment of the mA and/or kV according to patient size and/or use of iterative reconstruction technique. CONTRAST:  75mL OMNIPAQUE  IOHEXOL  350 MG/ML SOLN COMPARISON:  CT dated 07/19/2024. FINDINGS: Lower chest: Diffuse interstitial coarsening and fibrosis. There is mild cardiomegaly. No intra-abdominal free air or free fluid. Hepatobiliary: The liver is unremarkable. There is diffuse biliary dilatation, post cholecystectomy. No retained calcified stone noted in the central CBD. Pancreas: Unremarkable. No pancreatic ductal dilatation or surrounding inflammatory changes. Spleen: Normal in size without focal abnormality. Adrenals/Urinary Tract: The adrenal glands unremarkable. Small left renal cyst. There is no hydronephrosis on either side. There is symmetric enhancement and excretion of contrast by both kidneys. The visualized ureters appear unremarkable. The urinary bladder is collapsed. Stomach/Bowel: There is a small hiatal hernia. There is dilatation of the visualized distal esophagus with liquid content may represent delayed clearance or reflux. There is diffuse small bowel dilatation with several areas of segmental thickening and narrowing likely representing adhesions and resulting in obstruction. For example focal area of thickening and adhesion in the anterior pelvis (58/3) likely related to peritoneal adhesion. Additional area of probable adhesion in the right lower abdomen (54/3). The small bowel measures up to approximately 6.5 cm in caliber. There is mild fecalization of the distal ileum. There is sigmoid diverticulosis. The appendix is normal. Vascular/Lymphatic: Moderate aortoiliac atherosclerotic disease. The IVC is unremarkable. No portal gas. There is no adenopathy. Reproductive: Hysterectomy.  No suspicious adnexal masses. Other: None Musculoskeletal: Osteopenia with degenerative changes of the spine. No acute osseous pathology. IMPRESSION: 1.  Small-bowel obstruction secondary to multiple areas of small-bowel adhesions. 2. Sigmoid diverticulosis. 3.  Aortic Atherosclerosis (ICD10-I70.0). Electronically Signed   By: Vanetta Chou M.D.   On: 11/11/2024 12:57   DG Chest 2 View Result Date: 11/11/2024 CLINICAL DATA:  Weakness. EXAM: DG CHEST 2V COMPARISON:  Chest radiograph dated 07/19/2024. FINDINGS: Shallow inspiration with bibasilar atelectasis. There is diffuse chronic interstitial coarsening and bronchitic changes. No focal consolidation pleural effusion or pneumothorax. Mild cardiomegaly. No acute osseous pathology. IMPRESSION: 1. No active cardiopulmonary disease. 2. Mild cardiomegaly. Electronically Signed   By: Vanetta Chou M.D.   On: 11/11/2024 11:40    Review of Systems  Constitutional:  Positive for appetite change.  HENT: Negative.    Eyes: Negative.   Respiratory: Negative.    Cardiovascular: Negative.   Gastrointestinal:  Positive for abdominal distention, nausea and vomiting.  Endocrine: Negative.   Genitourinary: Negative.   Musculoskeletal: Negative.   Skin: Negative.   Allergic/Immunologic: Negative.   Neurological: Negative.   Hematological: Negative.   Psychiatric/Behavioral: Negative.     Blood pressure (!) 123/93, pulse 92, temperature 97.6 F (36.4 C), resp. rate 14, height 5' 3 (1.6 m), weight 55.3 kg, SpO2 100%. Physical Exam Eyes:     General: No scleral icterus. Cardiovascular:     Rate and Rhythm: Normal rate and regular rhythm.  Pulmonary:     Effort: Pulmonary effort is normal.     Breath sounds: Normal breath sounds. No wheezing.  Abdominal:     General: There is distension.     Tenderness: There is no abdominal tenderness. There is no guarding or rebound.     Comments: Multiple surgical scars  Skin:    General: Skin is warm.  Neurological:     Mental Status: She is alert and oriented to person, place, and time.  Psychiatric:        Mood and Affect: Mood normal.  Assessment/Plan: 88yo with chronic SB dysmotility and multifocal SBO on CT likely due to  - NGT to LIWS. Once she has time to decompress, we will likely proceed with SBO protocol. No need for emergent surgery. She and her family are frustrated that she has this long-term bowel issue and were hoping we could fix it all with surgery. It is not that simple. We will follow. Appreciate medical admission.  Dann FORBES Hummer 11/11/2024, 2:07 PM         [1]  Allergies Allergen Reactions   Tape Other (See Comments)    Skin tears easily.  OK with paper tape.   Magnesium -Containing Compounds Diarrhea and Nausea And Vomiting    Can take Vitamins w/ magnesium  Does not tolerate po magnesium  by itself

## 2024-11-11 NOTE — ED Notes (Signed)
 IV attempt x1, will try for ultrasound IV

## 2024-11-11 NOTE — ED Provider Notes (Signed)
 " [place] [facility] Provider Note   CSN: [id number] Arrival date & time: 11/11/24  9053     Patient presents with: Hematemesis   Andrea Brooks is a 88 y.o. female with PMHx CHF, DM, diverticulosis, GERD, HLD, HTN, CAD who presents to ED concerned for epigastric abdominal pain, nausea, vomiting, diarrhea progressing over the past 2 days.  Patient concerned because her emesis was brown this morning which is how her bowel obstruction presented earlier this year.  Patient denies fever, cough, SOB, dysuria, hematuria, hematochezia.   HPI     Prior to Admission medications  Medication Sig Start Date End Date Taking? Authorizing Provider  acetaminophen  (TYLENOL ) 500 MG tablet Take 1 tablet (500 mg total) by mouth every 8 (eight) hours as needed for moderate pain. 08/09/23   Singh, Prashant K, MD  aspirin  EC 81 MG tablet Take 81 mg by mouth daily.    [provider]  calcium  carbonate (TUMS - DOSED IN MG ELEMENTAL CALCIUM ) 500 MG chewable tablet Chew 2 tablets (400 mg of elemental calcium  total) by mouth daily as needed for indigestion or heartburn. 07/24/24   Odell Celinda Balo, MD  furosemide  (LASIX ) 20 MG tablet Take 1 tablet (20 mg total) by mouth daily. 07/31/24 07/31/25  Chandra Toribio POUR, MD  LORazepam  (ATIVAN ) 1 MG tablet TAKE 1/2 TO 1 TABLET AS    NEEDED FOR ANXIETY (MAXIMUM ONCE DAILY) 10/31/24   Chandra Toribio POUR, MD  Magnesium  200 MG CHEW Chew 400 mg by mouth 2 (two) times daily. 07/16/24   Wouk, Devaughn Sayres, MD  magnesium  sulfate 2 GM/50ML SOLN infusion Infuse 2 grams IV at a rate of 1 gram per hour 10/31/24   Chandra Toribio POUR, MD  metoprolol  tartrate (LOPRESSOR ) 25 MG tablet Take 0.5 tablets (12.5 mg total) by mouth 2 (two) times daily. 08/03/24   West, Katlyn D, NP  metroNIDAZOLE  (FLAGYL ) 250 MG tablet Take 250 mg by mouth every morning.    [provider]  Multiple Vitamins-Minerals (MULTI FOR HER 50+) TABS Take 1 tablet by mouth  daily.    [provider]  polyethylene glycol powder (GLYCOLAX /MIRALAX ) 17 GM/SCOOP powder Take 8.5 g by mouth as needed for moderate constipation.    [provider]  pyridostigmine  (MESTINON ) 60 MG tablet Take 30 mg by mouth daily. 02/03/22   [provider]  simvastatin  (ZOCOR ) 20 MG tablet Take 1 tablet (20 mg total) by mouth at bedtime. 01/19/24   [name], MD  valsartan  (DIOVAN ) 320 MG tablet Take 1 tablet (320 mg total) by mouth daily. 01/19/24   [name], MD    Allergies: Tape and Magnesium -containing compounds    Review of Systems  Gastrointestinal:  Positive for abdominal pain.    Updated Vital Signs BP (!) 123/93   Pulse 92   Temp 97.6 F (36.4 C)   Resp 14   Ht 5' 3 (1.6 m)   Wt 55.3 kg   SpO2 100%   BMI 21.61 kg/m   Physical Exam Vitals and nursing note reviewed.  Constitutional:      General: She is not in acute distress.    Appearance: She is not ill-appearing or toxic-appearing.  HENT:     Head: Normocephalic and atraumatic.     Mouth/Throat:     Mouth: Mucous membranes are moist.  Eyes:     General: No scleral icterus.       Right eye: No discharge.  Left eye: No discharge.     Conjunctiva/sclera: Conjunctivae normal.  Cardiovascular:     Rate and Rhythm: Regular rhythm. Tachycardia present.     Pulses: Normal pulses.     Heart sounds: Normal heart sounds. No murmur heard. Pulmonary:     Effort: Pulmonary effort is normal. No respiratory distress.     Breath sounds: Normal breath sounds. No wheezing, rhonchi or rales.  Abdominal:     General: Abdomen is flat. Bowel sounds are normal. There is no distension.     Palpations: Abdomen is soft. There is no mass.     Tenderness: There is abdominal tenderness.     Comments: Epigastric tenderness  Musculoskeletal:     Right lower leg: No edema.     Left lower leg: No edema.  Skin:    General: Skin is warm and dry.     Findings: No rash.  Neurological:      General: No focal deficit present.     Mental Status: She is alert and oriented to person, place, and time. Mental status is at baseline.  Psychiatric:        Mood and Affect: Mood normal.        Behavior: Behavior normal.     (all labs ordered are listed, but only abnormal results are displayed) Labs Reviewed  COMPREHENSIVE METABOLIC PANEL WITH GFR - Abnormal; Notable for the following components:      Result Value   Glucose, Bld 130 (*)    GFR, Estimated 58 (*)    All other components within normal limits  LIPASE, BLOOD - Abnormal; Notable for the following components:   Lipase 52 (*)    All other components within normal limits  I-STAT CG4 LACTIC ACID, ED - Abnormal; Notable for the following components:   Lactic Acid, Venous 2.0 (*)    All other components within normal limits  CBG MONITORING, ED - Abnormal; Notable for the following components:   Glucose-Capillary 129 (*)    All other components within normal limits  CBC WITH DIFFERENTIAL/PLATELET  URINALYSIS, W/ REFLEX TO CULTURE (INFECTION SUSPECTED)  I-STAT CG4 LACTIC ACID, ED  TYPE AND SCREEN    EKG: None  Radiology: CT ABDOMEN PELVIS W CONTRAST Result Date: 11/11/2024 CLINICAL DATA:  Concern for bowel obstruction. EXAM: CT ABDOMEN AND PELVIS WITH CONTRAST TECHNIQUE: Multidetector CT imaging of the abdomen and pelvis was performed using the standard protocol following bolus administration of intravenous contrast. RADIATION DOSE REDUCTION: This exam was performed according to the departmental dose-optimization program which includes automated exposure control, adjustment of the mA and/or kV according to patient size and/or use of iterative reconstruction technique. CONTRAST:  75mL OMNIPAQUE  IOHEXOL  350 MG/ML SOLN COMPARISON:  CT dated 07/19/2024. FINDINGS: Lower chest: Diffuse interstitial coarsening and fibrosis. There is mild cardiomegaly. No intra-abdominal free air or free fluid. Hepatobiliary: The liver is  unremarkable. There is diffuse biliary dilatation, post cholecystectomy. No retained calcified stone noted in the central CBD. Pancreas: Unremarkable. No pancreatic ductal dilatation or surrounding inflammatory changes. Spleen: Normal in size without focal abnormality. Adrenals/Urinary Tract: The adrenal glands unremarkable. Small left renal cyst. There is no hydronephrosis on either side. There is symmetric enhancement and excretion of contrast by both kidneys. The visualized ureters appear unremarkable. The urinary bladder is collapsed. Stomach/Bowel: There is a small hiatal hernia. There is dilatation of the visualized distal esophagus with liquid content may represent delayed clearance or reflux. There is diffuse small bowel dilatation with several areas of segmental thickening and  narrowing likely representing adhesions and resulting in obstruction. For example focal area of thickening and adhesion in the anterior pelvis (58/3) likely related to peritoneal adhesion. Additional area of probable adhesion in the right lower abdomen (54/3). The small bowel measures up to approximately 6.5 cm in caliber. There is mild fecalization of the distal ileum. There is sigmoid diverticulosis. The appendix is normal. Vascular/Lymphatic: Moderate aortoiliac atherosclerotic disease. The IVC is unremarkable. No portal gas. There is no adenopathy. Reproductive: Hysterectomy.  No suspicious adnexal masses. Other: None Musculoskeletal: Osteopenia with degenerative changes of the spine. No acute osseous pathology. IMPRESSION: 1. Small-bowel obstruction secondary to multiple areas of small-bowel adhesions. 2. Sigmoid diverticulosis. 3.  Aortic Atherosclerosis (ICD10-I70.0). Electronically Signed   By: Vanetta Chou M.D.   On: 11/11/2024 12:57   DG Chest 2 View Result Date: 11/11/2024 CLINICAL DATA:  Weakness. EXAM: DG CHEST 2V COMPARISON:  Chest radiograph dated 07/19/2024. FINDINGS: Shallow inspiration with bibasilar  atelectasis. There is diffuse chronic interstitial coarsening and bronchitic changes. No focal consolidation pleural effusion or pneumothorax. Mild cardiomegaly. No acute osseous pathology. IMPRESSION: 1. No active cardiopulmonary disease. 2. Mild cardiomegaly. Electronically Signed   By: Vanetta Chou M.D.   On: 11/11/2024 11:40     .Critical Care  Performed by: Hoy Nidia FALCON, PA-C Authorized by: Hoy Nidia FALCON, PA-C   Critical care provider statement:    Critical care time (minutes):  30   Critical care was necessary to treat or prevent imminent or life-threatening deterioration of the following conditions: Small bowel obstruction.   Critical care was time spent personally by me on the following activities:  Development of treatment plan with patient or surrogate, discussions with consultants, evaluation of patient's response to treatment, examination of patient, ordering and review of laboratory studies, ordering and review of radiographic studies, ordering and performing treatments and interventions, pulse oximetry, re-evaluation of patient's condition and review of old charts   Care discussed with: admitting provider      Medications Ordered in the ED  ondansetron  (ZOFRAN ) injection 4 mg (4 mg Intravenous Given 11/11/24 1159)  pantoprazole  (PROTONIX ) injection 40 mg (40 mg Intravenous Given 11/11/24 1210)  morphine  (PF) 2 MG/ML injection 2 mg (2 mg Intravenous Given 11/11/24 1202)  lactated ringers  bolus 500 mL (500 mLs Intravenous New Bag/Given 11/11/24 1214)  iohexol  (OMNIPAQUE ) 350 MG/ML injection 75 mL (75 mLs Intravenous Contrast Given 11/11/24 1246)                                    Medical Decision Making Amount and/or Complexity of Data Reviewed Labs: ordered. Radiology: ordered.  Risk Prescription drug management.    This patient presents to the ED for concern of abdominal pain, this involves an extensive number of treatment options, and is a  complaint that carries with it a high risk of complications and morbidity.  The differential diagnosis includes gastroenteritis, colitis, small bowel obstruction, appendicitis, cholecystitis, pancreatitis, nephrolithiasis, UTI, pyleonephritis   Co morbidities that complicate the patient evaluation  CHF, DM, diverticulosis, GERD, HLD, HTN, CAD   Additional history obtained:  06/2024 ED note: Patient admitted for SBO 06/2024 ECHO: 60-65% EF   Problem List / ED Course / Critical interventions / Medication management  Patient presented for nausea, vomiting, diarrhea, abdominal pain over the past 2 days.  Physical exam with epigastric tenderness to palpation.  Rest of physical exam reassuring.  Patient initially mildly tachycardic at 102  but vitals are otherwise stable. I Ordered, and personally interpreted labs.  CBC without leukocytosis or anemia.  CMP with mildly elevated glucose at 130.  Lipase mildly elevated at 52.  Lactic acid within normal limits. I ordered imaging studies including CT Abd/Pelvis.  Triage provider ordering chest x-ray. I independently visualized and interpreted imaging and I agree with the radiologist interpretation of SBO. Patient provided with small IV fluid bolus, Protonix , morphine , and Zofran .  Symptoms are currently well-controlled I do not think she needs an NG tube at this time.  Educated patient that this diagnosis would require hospital admission.  Patient agreeable with plan. I requested consultation with the general surgeon on-call Dr. Sebastian,  and discussed lab and imaging findings as well as pertinent plan - they agreed to evaluate patient. Dr. Claudene admitting provider. I have reviewed the patients home medicines and have made adjustments as needed  Social Determinants of Health:  Geriatric       Final diagnoses:  Small bowel obstruction due to adhesions [facility])    ED Discharge Orders     None          Hoy Nidia FALCON, [place] 11/11/24  1353  "

## 2024-11-12 ENCOUNTER — Inpatient Hospital Stay (HOSPITAL_COMMUNITY)

## 2024-11-12 DIAGNOSIS — K565 Intestinal adhesions [bands], unspecified as to partial versus complete obstruction: Secondary | ICD-10-CM | POA: Diagnosis not present

## 2024-11-12 DIAGNOSIS — I5030 Unspecified diastolic (congestive) heart failure: Secondary | ICD-10-CM | POA: Diagnosis not present

## 2024-11-12 DIAGNOSIS — K6389 Other specified diseases of intestine: Secondary | ICD-10-CM | POA: Diagnosis not present

## 2024-11-12 DIAGNOSIS — E1159 Type 2 diabetes mellitus with other circulatory complications: Secondary | ICD-10-CM | POA: Diagnosis not present

## 2024-11-12 DIAGNOSIS — K56609 Unspecified intestinal obstruction, unspecified as to partial versus complete obstruction: Secondary | ICD-10-CM

## 2024-11-12 DIAGNOSIS — I152 Hypertension secondary to endocrine disorders: Secondary | ICD-10-CM | POA: Diagnosis not present

## 2024-11-12 DIAGNOSIS — F419 Anxiety disorder, unspecified: Secondary | ICD-10-CM | POA: Diagnosis not present

## 2024-11-12 LAB — BASIC METABOLIC PANEL WITH GFR
Anion gap: 10 (ref 5–15)
BUN: 29 mg/dL — ABNORMAL HIGH (ref 8–23)
CO2: 27 mmol/L (ref 22–32)
Calcium: 8.7 mg/dL — ABNORMAL LOW (ref 8.9–10.3)
Chloride: 106 mmol/L (ref 98–111)
Creatinine, Ser: 0.93 mg/dL (ref 0.44–1.00)
GFR, Estimated: 60 mL/min — ABNORMAL LOW
Glucose, Bld: 102 mg/dL — ABNORMAL HIGH (ref 70–99)
Potassium: 4.6 mmol/L (ref 3.5–5.1)
Sodium: 143 mmol/L (ref 135–145)

## 2024-11-12 LAB — CBC
HCT: 32.7 % — ABNORMAL LOW (ref 36.0–46.0)
Hemoglobin: 10.4 g/dL — ABNORMAL LOW (ref 12.0–15.0)
MCH: 29.5 pg (ref 26.0–34.0)
MCHC: 31.8 g/dL (ref 30.0–36.0)
MCV: 92.6 fL (ref 80.0–100.0)
Platelets: 199 K/uL (ref 150–400)
RBC: 3.53 MIL/uL — ABNORMAL LOW (ref 3.87–5.11)
RDW: 14.7 % (ref 11.5–15.5)
WBC: 9.9 K/uL (ref 4.0–10.5)
nRBC: 0 % (ref 0.0–0.2)

## 2024-11-12 NOTE — Progress Notes (Signed)
 "          [facility]                                                                              Andrea Brooks, is a 88 y.o. 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 She has experienced a weight loss of five pounds since November 06, 2024, and has been having difficulty eating, with her last substantial meal being on Thanksgiving.  She maintains an appetite, but is only able to have small meals here and there.  Recently she has not had a bowel movement since the day before yesterday, which was diarrhea.   She has a history of pneumatosis intestinalis cystoides and has been treated at [facility]. She has been on Flagyl  and Mestinon , which has been somewhat successful.  She is had several abdominal surgeries previously. She has experienced recurrent episodes of vomiting and diarrhea since 2019 or 2020, following an E. coli infection from [employer].   She experiences episodes of vomiting, with the vomit described as brown in color, but no blood. She has been on Mestinon  to aid bowel movement due to a 'lazy' small intestine, but was recently taken off it to observe her response. She reports no urge to have a bowel movement since discontinuing Mestinon . CT abdomen pelvis noted a small bowel obstruction secondary to multiple areas of small bowel adhesions and sigmoid diverticulosis. General surgery was consulted    Assessment & Plan    Principal Problem:   Small bowel obstruction due to adhesions [facility]) - previous laparotomy for pneumatosis - Patient was placed on n.p.o.  status, IV fluids, NGT, antiemetics - General Surgery consulted - This morning, noted to have good BM,, NGT was clamped - Per surgery, start clear liquid diet and do not recommend surgery given resolution of obstruction.    Pneumatosis intestinalis cystoides Patient followed at [facility] in regards to this issue.  Patient has had several abdominal surgeries, on metronidazole  for the last 2 years along with Mestinon  which was recently discontinued by the provider at [facility]. -  Continue metronidazole  500 mg IV until tolerating p.o.   HFpEF /chronic diastolic CHF 2D echo 07/20/2024 showed EF 60 to 65% with indeterminate diastolic parameters 07/20/2024.   - Stable, euvolemic   Essential hypertension - BP stable, p.o. regimen currently on hold Home blood pressure medication regimen on hold as n.p.o. resume once medically able   Hyperlipidemia - Resume statin once consistently tolerating diet   Anxiety - Continue Ativan  IV as needed   GERD - Continue IV Protonix    Estimated body mass index is 23.51 kg/m as calculated from the following:   Height as of this encounter: 5' 3 (1.6 m).   Weight as of this  encounter: 60.2 kg.  Code Status: Full code DVT Prophylaxis:  SCDs Start: 11/11/24 1412   Level of Care: Level of care: Telemetry Family Communication: Updated patient Disposition Plan:      Remains inpatient appropriate:      Procedures:    Consultants:   General Surgery  Antimicrobials:   Anti-infectives (From admission, onward)    Start     Dose/Rate Route Frequency Ordered Stop   11/11/24 2000  metroNIDAZOLE  (FLAGYL ) IVPB 500 mg        500 mg 100 mL/hr over 60 Minutes Intravenous Daily 11/11/24 1552     11/11/24 1515  metroNIDAZOLE  (FLAGYL ) IVPB 250 mg 50 mL  Status:  Discontinued        250 mg 50 mL/hr over 60 Minutes Intravenous Daily 11/11/24 1508 11/11/24 1552          Medications  pantoprazole  (PROTONIX ) IV  40 mg Intravenous Q12H   sodium chloride  flush  3 mL  Intravenous Q12H      Subjective:   Andrea Brooks was seen and examined today.  Sitting up in the chair, no acute complaints, NGT clamped.  Had a good BM this morning.  Currently denies any dizziness, chest pain, shortness of breath, abdominal pain, N/V.  Objective:   Vitals:   11/11/24 2123 11/11/24 2333 11/12/24 0500 11/12/24 0529  BP: (!) 102/49 (!) 99/55  131/60  Pulse: 90 88  89  Resp: 17 16  17   Temp: 98.2 F (36.8 C)   97.9 F (36.6 C)  TempSrc: Oral   Oral  SpO2: 95% 97%  95%  Weight:   60.2 kg   Height:        Intake/Output Summary (Last 24 hours) at 11/12/2024 1006 Last data filed at 11/12/2024 0300 Gross per 24 hour  Intake 627.71 ml  Output 500 ml  Net 127.71 ml     Wt Readings from Last 3 Encounters:  11/12/24 60.2 kg  08/30/24 59.7 kg  08/02/24 59.5 kg     Exam General: Alert and oriented, NAD, appears comfortable, sitting up in the chair.  NGT clamped Cardiovascular: S1 S2 auscultated,  RRR Respiratory: Clear to auscultation bilaterally, no wheezing Gastrointestinal: Soft, nontender, nondistended Ext: no pedal edema bilaterally Neuro: no new deficits Psych: Normal affect     Data Reviewed:  I have personally reviewed following labs    CBC Lab Results  Component Value Date   WBC 9.9 11/12/2024   RBC 3.53 (L) 11/12/2024   HGB 10.4 (L) 11/12/2024   HCT 32.7 (L) 11/12/2024   MCV 92.6 11/12/2024   MCH 29.5 11/12/2024   PLT 199 11/12/2024   MCHC 31.8 11/12/2024   RDW 14.7 11/12/2024   LYMPHSABS 1.0 11/11/2024   MONOABS 0.5 11/11/2024   EOSABS 0.0 11/11/2024   BASOSABS 0.0 11/11/2024     Last metabolic panel Lab Results  Component Value Date   NA 143 11/12/2024   K 4.6 11/12/2024   CL 106 11/12/2024   CO2 27 11/12/2024   BUN 29 (H) 11/12/2024   CREATININE 0.93 11/12/2024   GLUCOSE 102 (H) 11/12/2024   GFRNONAA 60 (L) 11/12/2024   GFRAA 74 11/11/2020   CALCIUM  8.7 (L) 11/12/2024   PHOS 3.3 07/24/2024   PROT 7.5 11/11/2024    ALBUMIN 3.9 11/11/2024   LABGLOB 3.3 09/19/2024   AGRATIO 1.3 09/15/2022   BILITOT 0.3 11/11/2024   ALKPHOS 65 11/11/2024   AST 24 11/11/2024   ALT 9 11/11/2024   ANIONGAP 10 11/12/2024  CBG (last 3)  Recent Labs    11/11/24 1000  GLUCAP 129*      Coagulation Profile: No results for input(s): INR, PROTIME in the last 168 hours.   Radiology Studies: I have personally reviewed the imaging studies  DG Abd Portable 1V-Small Bowel Obstruction Protocol-initial, 8 hr delay Result Date: 11/12/2024 EXAM: 1 VIEW XRAY OF THE ABDOMEN 11/12/2024 07:14:00 AM COMPARISON: KUB dated 11/11/2024. CLINICAL HISTORY: FINDINGS: LINES, TUBES AND DEVICES: An enteric catheter remains with its tip along the greater curvature of the mid body of the stomach. BOWEL: There is now enteric contrast present within multiple small bowel loops, which remain mildly distended with both air and fluid. SOFT TISSUES: No abnormal calcifications. BONES: No acute fracture. IMPRESSION: 1. Mildly distended small bowel loops with air and fluid, now containing enteric contrast (compared to KUB 11/11/24). 2. Enteric catheter tip along the greater curvature of the mid body of the stomach. Electronically signed by: Evalene Coho MD 11/12/2024 07:37 AM EST RP Workstation: [id number]   DG Abd Portable 1V-Small Bowel Protocol-Position Verification Result Date: 11/11/2024 EXAM: 1 VIEW XRAY OF THE ABDOMEN 11/11/2024 07:08:00 PM COMPARISON: 07/21/2024 CLINICAL HISTORY: Encounter for imaging study to confirm nasogastric (NG) tube placement. FINDINGS: LINES, TUBES AND DEVICES: Enteric tube in place with side hole and tip projecting over expected location of gastric body. BOWEL: Diffuse gas-filled loops of bowel in upper abdomen, with small bowel measuring up to 5.5 cm. SOFT TISSUES: Right upper quadrant surgical clips noted. No abnormal calcifications. BONES: No acute fracture. LUNG BASES: Chronic interstitial changes of lung bases.  IMPRESSION: 1. Enteric tube in place with side hole and tip projecting over expected location of gastric body. 2. Diffuse gas-filled loops of bowel in upper abdomen, with small bowel measuring up to 5.5 cm. Electronically signed by: Oneil Devonshire MD 11/11/2024 07:21 PM EST RP Workstation: [id number]   CT ABDOMEN PELVIS W CONTRAST Result Date: 11/11/2024 CLINICAL DATA:  Concern for bowel obstruction. EXAM: CT ABDOMEN AND PELVIS WITH CONTRAST TECHNIQUE: Multidetector CT imaging of the abdomen and pelvis was performed using the standard protocol following bolus administration of intravenous contrast. RADIATION DOSE REDUCTION: This exam was performed according to the departmental dose-optimization program which includes automated exposure control, adjustment of the mA and/or kV according to patient size and/or use of iterative reconstruction technique. CONTRAST:  75mL OMNIPAQUE  IOHEXOL  350 MG/ML SOLN COMPARISON:  CT dated 07/19/2024. FINDINGS: Lower chest: Diffuse interstitial coarsening and fibrosis. There is mild cardiomegaly. No intra-abdominal free air or free fluid. Hepatobiliary: The liver is unremarkable. There is diffuse biliary dilatation, post cholecystectomy. No retained calcified stone noted in the central CBD. Pancreas: Unremarkable. No pancreatic ductal dilatation or surrounding inflammatory changes. Spleen: Normal in size without focal abnormality. Adrenals/Urinary Tract: The adrenal glands unremarkable. Small left renal cyst. There is no hydronephrosis on either side. There is symmetric enhancement and excretion of contrast by both kidneys. The visualized ureters appear unremarkable. The urinary bladder is collapsed. Stomach/Bowel: There is a small hiatal hernia. There is dilatation of the visualized distal esophagus with liquid content may represent delayed clearance or reflux. There is diffuse small bowel dilatation with several areas of segmental thickening and narrowing likely representing  adhesions and resulting in obstruction. For example focal area of thickening and adhesion in the anterior pelvis (58/3) likely related to peritoneal adhesion. Additional area of probable adhesion in the right lower abdomen (54/3). The small bowel measures up to approximately 6.5 cm in caliber. There is mild fecalization of the distal ileum.  There is sigmoid diverticulosis. The appendix is normal. Vascular/Lymphatic: Moderate aortoiliac atherosclerotic disease. The IVC is unremarkable. No portal gas. There is no adenopathy. Reproductive: Hysterectomy.  No suspicious adnexal masses. Other: None Musculoskeletal: Osteopenia with degenerative changes of the spine. No acute osseous pathology. IMPRESSION: 1. Small-bowel obstruction secondary to multiple areas of small-bowel adhesions. 2. Sigmoid diverticulosis. 3.  Aortic Atherosclerosis (ICD10-I70.0). Electronically Signed   By: Vanetta Chou M.D.   On: 11/11/2024 12:57   DG Chest 2 View Result Date: 11/11/2024 CLINICAL DATA:  Weakness. EXAM: DG CHEST 2V COMPARISON:  Chest radiograph dated 07/19/2024. FINDINGS: Shallow inspiration with bibasilar atelectasis. There is diffuse chronic interstitial coarsening and bronchitic changes. No focal consolidation pleural effusion or pneumothorax. Mild cardiomegaly. No acute osseous pathology. IMPRESSION: 1. No active cardiopulmonary disease. 2. Mild cardiomegaly. Electronically Signed   By: Vanetta Chou M.D.   On: 11/11/2024 11:40       Maysel Mccolm M.D. [facility] 11/12/2024, 10:06 AM  Available via Epic secure chat 7am-7pm After 7 pm, please refer to night coverage provider listed on amion.    "

## 2024-11-12 NOTE — Progress Notes (Signed)
 Patient's NG tube has been clamped for 6 hours and she has tolerated clear liquids with no nausea/vomiting.  Removed NG tube per Leonor Dawn MD.

## 2024-11-12 NOTE — Plan of Care (Signed)
" °  Problem: Education: Goal: Knowledge of General Education information will improve Description: Including pain rating scale, medication(s)/side effects and non-pharmacologic comfort measures 11/12/2024 2029 by Harley Fitzwater K, RN Outcome: Progressing 11/12/2024 2029 by Florian Dellis POUR, RN Outcome: Progressing   Problem: Health Behavior/Discharge Planning: Goal: Ability to manage health-related needs will improve 11/12/2024 2029 by Nicholl Onstott K, RN Outcome: Progressing 11/12/2024 2029 by Florian Dellis POUR, RN Outcome: Progressing   Problem: Clinical Measurements: Goal: Ability to maintain clinical measurements within normal limits will improve 11/12/2024 2029 by Montrel Donahoe K, RN Outcome: Progressing 11/12/2024 2029 by Florian Dellis POUR, RN Outcome: Progressing Goal: Will remain free from infection 11/12/2024 2029 by Florian Dellis POUR, RN Outcome: Progressing 11/12/2024 2029 by Florian Dellis POUR, RN Outcome: Progressing Goal: Diagnostic test results will improve 11/12/2024 2029 by Florian Dellis POUR, RN Outcome: Progressing 11/12/2024 2029 by Florian Dellis POUR, RN Outcome: Progressing Goal: Respiratory complications will improve 11/12/2024 2029 by Florian Dellis POUR, RN Outcome: Progressing 11/12/2024 2029 by Florian Dellis POUR, RN Outcome: Progressing Goal: Cardiovascular complication will be avoided 11/12/2024 2029 by Andrej Spagnoli K, RN Outcome: Progressing 11/12/2024 2029 by Florian Dellis POUR, RN Outcome: Progressing   Problem: Activity: Goal: Risk for activity intolerance will decrease 11/12/2024 2029 by Florian Dellis POUR, RN Outcome: Progressing 11/12/2024 2029 by Florian Dellis POUR, RN Outcome: Progressing   Problem: Nutrition: Goal: Adequate nutrition will be maintained 11/12/2024 2029 by Florian Dellis POUR, RN Outcome: Progressing 11/12/2024 2029 by Florian Dellis POUR, RN Outcome: Progressing   Problem: Coping: Goal: Level of anxiety will decrease 11/12/2024  2029 by Florian Dellis POUR, RN Outcome: Progressing 11/12/2024 2029 by Florian Dellis POUR, RN Outcome: Progressing   Problem: Elimination: Goal: Will not experience complications related to bowel motility 11/12/2024 2029 by Florian Dellis POUR, RN Outcome: Progressing 11/12/2024 2029 by Florian Dellis POUR, RN Outcome: Progressing Goal: Will not experience complications related to urinary retention 11/12/2024 2029 by Florian Dellis POUR, RN Outcome: Progressing 11/12/2024 2029 by Florian Dellis POUR, RN Outcome: Progressing   Problem: Pain Managment: Goal: General experience of comfort will improve and/or be controlled 11/12/2024 2029 by Florian Dellis POUR, RN Outcome: Progressing 11/12/2024 2029 by Florian Dellis POUR, RN Outcome: Progressing   Problem: Safety: Goal: Ability to remain free from injury will improve 11/12/2024 2029 by Florian Dellis POUR, RN Outcome: Progressing 11/12/2024 2029 by Florian Dellis POUR, RN Outcome: Progressing   Problem: Skin Integrity: Goal: Risk for impaired skin integrity will decrease 11/12/2024 2029 by Florian Dellis POUR, RN Outcome: Progressing 11/12/2024 2029 by Dracen Reigle K, RN Outcome: Progressing   "

## 2024-11-12 NOTE — Plan of Care (Signed)
  Problem: Education: Goal: Knowledge of General Education information will improve Description: Including pain rating scale, medication(s)/side effects and non-pharmacologic comfort measures Outcome: Progressing   Problem: Activity: Goal: Risk for activity intolerance will decrease Outcome: Progressing   Problem: Elimination: Goal: Will not experience complications related to urinary retention Outcome: Progressing   Problem: Pain Managment: Goal: General experience of comfort will improve and/or be controlled Outcome: Progressing   Problem: Safety: Goal: Ability to remain free from injury will improve Outcome: Progressing

## 2024-11-12 NOTE — Plan of Care (Signed)
   Problem: Health Behavior/Discharge Planning: Goal: Ability to manage health-related needs will improve Outcome: Progressing

## 2024-11-12 NOTE — Progress Notes (Signed)
 "      Subjective: XR this morning shows progression of contrast and the patient had a bowel movement this morning. Denies abdominal pain.   Objective: Vital signs in last 24 hours: Temp:  [97.4 F (36.3 C)-98.2 F (36.8 C)] 97.9 F (36.6 C) (12/21 0529) Pulse Rate:  [88-102] 89 (12/21 0529) Resp:  [14-17] 17 (12/21 0529) BP: (99-131)/(49-93) 131/60 (12/21 0529) SpO2:  [94 %-100 %] 95 % (12/21 0529) Weight:  [55.3 kg-60.2 kg] 60.2 kg (12/21 0500) Last BM Date : 11/11/24  Intake/Output from previous day: 12/20 0701 - 12/21 0700 In: 627.7 [I.V.:527.7; IV Piggyback:100] Out: 500 [Emesis/NG output:500] Intake/Output this shift: No intake/output data recorded.  PE: General: resting comfortably, NAD HEENT: NG in place, clamped Neuro: alert and oriented, no focal deficits Resp: normal work of breathing on room air Abdomen: soft, nondistended, nontender to palpation. Extremities: warm and well-perfused   Lab Results:  Recent Labs    11/11/24 0958 11/12/24 0500  WBC 8.6 9.9  HGB 12.5 10.4*  HCT 39.8 32.7*  PLT 253 199   BMET Recent Labs    11/11/24 0958 11/12/24 0500  NA 141 143  K 4.3 4.6  CL 102 106  CO2 28 27  GLUCOSE 130* 102*  BUN 18 29*  CREATININE 0.96 0.93  CALCIUM  9.3 8.7*   PT/INR No results for input(s): LABPROT, INR in the last 72 hours. CMP     Component Value Date/Time   NA 143 11/12/2024 0500   NA 141 10/31/2024 1150   K 4.6 11/12/2024 0500   CL 106 11/12/2024 0500   CO2 27 11/12/2024 0500   GLUCOSE 102 (H) 11/12/2024 0500   BUN 29 (H) 11/12/2024 0500   BUN 8 10/31/2024 1150   CREATININE 0.93 11/12/2024 0500   CALCIUM  8.7 (L) 11/12/2024 0500   PROT 7.5 11/11/2024 0958   PROT 7.0 09/19/2024 1104   ALBUMIN 3.9 11/11/2024 0958   ALBUMIN 3.7 09/19/2024 1104   AST 24 11/11/2024 0958   ALT 9 11/11/2024 0958   ALKPHOS 65 11/11/2024 0958   BILITOT 0.3 11/11/2024 0958   BILITOT 0.3 09/19/2024 1104   GFRNONAA 60 (L) 11/12/2024 0500    GFRAA 74 11/11/2020 0903   Lipase     Component Value Date/Time   LIPASE 52 (H) 11/11/2024 1223       Studies/Results: DG Abd Portable 1V-Small Bowel Obstruction Protocol-initial, 8 hr delay Result Date: 11/12/2024 EXAM: 1 VIEW XRAY OF THE ABDOMEN 11/12/2024 07:14:00 AM COMPARISON: KUB dated 11/11/2024. CLINICAL HISTORY: FINDINGS: LINES, TUBES AND DEVICES: An enteric catheter remains with its tip along the greater curvature of the mid body of the stomach. BOWEL: There is now enteric contrast present within multiple small bowel loops, which remain mildly distended with both air and fluid. SOFT TISSUES: No abnormal calcifications. BONES: No acute fracture. IMPRESSION: 1. Mildly distended small bowel loops with air and fluid, now containing enteric contrast (compared to KUB 11/11/24). 2. Enteric catheter tip along the greater curvature of the mid body of the stomach. Electronically signed by: Evalene Coho MD 11/12/2024 07:37 AM EST RP Workstation: [id number]   DG Abd Portable 1V-Small Bowel Protocol-Position Verification Result Date: 11/11/2024 EXAM: 1 VIEW XRAY OF THE ABDOMEN 11/11/2024 07:08:00 PM COMPARISON: 07/21/2024 CLINICAL HISTORY: Encounter for imaging study to confirm nasogastric (NG) tube placement. FINDINGS: LINES, TUBES AND DEVICES: Enteric tube in place with side hole and tip projecting over expected location of gastric body. BOWEL: Diffuse gas-filled loops of bowel in upper abdomen, with  small bowel measuring up to 5.5 cm. SOFT TISSUES: Right upper quadrant surgical clips noted. No abnormal calcifications. BONES: No acute fracture. LUNG BASES: Chronic interstitial changes of lung bases. IMPRESSION: 1. Enteric tube in place with side hole and tip projecting over expected location of gastric body. 2. Diffuse gas-filled loops of bowel in upper abdomen, with small bowel measuring up to 5.5 cm. Electronically signed by: Oneil Devonshire MD 11/11/2024 07:21 PM EST RP Workstation: [id number]    CT ABDOMEN PELVIS W CONTRAST Result Date: 11/11/2024 CLINICAL DATA:  Concern for bowel obstruction. EXAM: CT ABDOMEN AND PELVIS WITH CONTRAST TECHNIQUE: Multidetector CT imaging of the abdomen and pelvis was performed using the standard protocol following bolus administration of intravenous contrast. RADIATION DOSE REDUCTION: This exam was performed according to the departmental dose-optimization program which includes automated exposure control, adjustment of the mA and/or kV according to patient size and/or use of iterative reconstruction technique. CONTRAST:  75mL OMNIPAQUE  IOHEXOL  350 MG/ML SOLN COMPARISON:  CT dated 07/19/2024. FINDINGS: Lower chest: Diffuse interstitial coarsening and fibrosis. There is mild cardiomegaly. No intra-abdominal free air or free fluid. Hepatobiliary: The liver is unremarkable. There is diffuse biliary dilatation, post cholecystectomy. No retained calcified stone noted in the central CBD. Pancreas: Unremarkable. No pancreatic ductal dilatation or surrounding inflammatory changes. Spleen: Normal in size without focal abnormality. Adrenals/Urinary Tract: The adrenal glands unremarkable. Small left renal cyst. There is no hydronephrosis on either side. There is symmetric enhancement and excretion of contrast by both kidneys. The visualized ureters appear unremarkable. The urinary bladder is collapsed. Stomach/Bowel: There is a small hiatal hernia. There is dilatation of the visualized distal esophagus with liquid content may represent delayed clearance or reflux. There is diffuse small bowel dilatation with several areas of segmental thickening and narrowing likely representing adhesions and resulting in obstruction. For example focal area of thickening and adhesion in the anterior pelvis (58/3) likely related to peritoneal adhesion. Additional area of probable adhesion in the right lower abdomen (54/3). The small bowel measures up to approximately 6.5 cm in caliber. There is  mild fecalization of the distal ileum. There is sigmoid diverticulosis. The appendix is normal. Vascular/Lymphatic: Moderate aortoiliac atherosclerotic disease. The IVC is unremarkable. No portal gas. There is no adenopathy. Reproductive: Hysterectomy.  No suspicious adnexal masses. Other: None Musculoskeletal: Osteopenia with degenerative changes of the spine. No acute osseous pathology. IMPRESSION: 1. Small-bowel obstruction secondary to multiple areas of small-bowel adhesions. 2. Sigmoid diverticulosis. 3.  Aortic Atherosclerosis (ICD10-I70.0). Electronically Signed   By: Vanetta Chou M.D.   On: 11/11/2024 12:57   DG Chest 2 View Result Date: 11/11/2024 CLINICAL DATA:  Weakness. EXAM: DG CHEST 2V COMPARISON:  Chest radiograph dated 07/19/2024. FINDINGS: Shallow inspiration with bibasilar atelectasis. There is diffuse chronic interstitial coarsening and bronchitic changes. No focal consolidation pleural effusion or pneumothorax. Mild cardiomegaly. No acute osseous pathology. IMPRESSION: 1. No active cardiopulmonary disease. 2. Mild cardiomegaly. Electronically Signed   By: Vanetta Chou M.D.   On: 11/11/2024 11:40      Assessment/Plan 88 yo female with a history of small bowel dysmotility with previous laparotomy for pneumatosis, presenting with nausea/vomiting and small bowel obstruction. Obstructive symptoms have resolved with gastrografin  and she is having bowel function. - Clamp NG tube, remove if tolerates for 2-3 hours - Clear liquid diet - Do not recommend surgery given resolution of adhesive obstruction. Patient has a component of chronic dysmotility, which I emphasized will not be resolved by surgery.    LOS: 1 day  Andrea Dawn, MD [facility] Surgery General, Hepatobiliary and Pancreatic Surgery 11/12/2024 9:46 AM  "

## 2024-11-13 ENCOUNTER — Encounter (HOSPITAL_COMMUNITY): Payer: Self-pay | Admitting: Family Medicine

## 2024-11-13 ENCOUNTER — Telehealth: Payer: Self-pay

## 2024-11-13 ENCOUNTER — Other Ambulatory Visit (HOSPITAL_COMMUNITY): Payer: Self-pay

## 2024-11-13 DIAGNOSIS — I5030 Unspecified diastolic (congestive) heart failure: Secondary | ICD-10-CM

## 2024-11-13 DIAGNOSIS — F419 Anxiety disorder, unspecified: Secondary | ICD-10-CM | POA: Diagnosis not present

## 2024-11-13 DIAGNOSIS — K56609 Unspecified intestinal obstruction, unspecified as to partial versus complete obstruction: Secondary | ICD-10-CM | POA: Diagnosis not present

## 2024-11-13 DIAGNOSIS — D649 Anemia, unspecified: Secondary | ICD-10-CM

## 2024-11-13 DIAGNOSIS — K565 Intestinal adhesions [bands], unspecified as to partial versus complete obstruction: Secondary | ICD-10-CM | POA: Diagnosis not present

## 2024-11-13 LAB — BASIC METABOLIC PANEL WITH GFR
Anion gap: 6 (ref 5–15)
BUN: 14 mg/dL (ref 8–23)
CO2: 26 mmol/L (ref 22–32)
Calcium: 8 mg/dL — ABNORMAL LOW (ref 8.9–10.3)
Chloride: 108 mmol/L (ref 98–111)
Creatinine, Ser: 0.72 mg/dL (ref 0.44–1.00)
GFR, Estimated: >60 mL/min
Glucose, Bld: 90 mg/dL (ref 70–99)
Potassium: 3.8 mmol/L (ref 3.5–5.1)
Sodium: 140 mmol/L (ref 135–145)

## 2024-11-13 LAB — MAGNESIUM: Magnesium: 1.4 mg/dL — ABNORMAL LOW (ref 1.7–2.4)

## 2024-11-13 MED ORDER — ONDANSETRON 4 MG PO TBDP
4.0000 mg | ORAL_TABLET | Freq: Three times a day (TID) | ORAL | 0 refills | Status: AC | PRN
  Filled 2024-11-13: qty 30, 10d supply, fill #0

## 2024-11-13 MED ORDER — MAGNESIUM SULFATE 50 % IJ SOLN
2.0000 g | Freq: Once | INTRAVENOUS | Status: AC
  Administered 2024-11-13: 2 g via INTRAVENOUS
  Filled 2024-11-13: qty 4

## 2024-11-13 MED ORDER — PANTOPRAZOLE SODIUM 40 MG PO TBEC
40.0000 mg | DELAYED_RELEASE_TABLET | Freq: Every day | ORAL | 3 refills | Status: AC
  Filled 2024-11-13: qty 30, 30d supply, fill #0

## 2024-11-13 NOTE — Progress Notes (Signed)
 Pt discharged to home with daughter.

## 2024-11-13 NOTE — Telephone Encounter (Signed)
 Copied from CRM #[id number]. Topic: Clinical - Medical Advice >> Nov 13, 2024  1:54 PM Terri G wrote: Reason for CRM: Patient is getting IV Infusions on 12/30 and they stated if Dr.Olson will order it they can check her blood there before she's given the infusions instead of coming here. Can you call to let her know if Dr.Olson can order it or not or does she have to come here to get blood work. Callback number [phone]

## 2024-11-13 NOTE — Evaluation (Signed)
 Physical Therapy One-Time Evaluation Patient Details Name: Andrea Brooks MRN: [id number] DOB: 1936-09-05 Today's Date: 11/13/2024  History of Present Illness  Pt is 88 y/o female admitted 11/11/24 with abd pain, nausea and vomiting, positive for SBO and started on NGT to suction.  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  Clinical Impression  Patient presents with mobility close to functional baseline.  Daughter present and reports she has all needed equipment.  Patient adamant about no follow up PT as she has her HEP already from prior HHPT sessions and feels she can return to those activities.  Able to ambulate with RW and S and only slight assist for sit to stand.  Patient encouraged for sit to stand to add to HEP and daughter able to assist.  No further skilled PT needs as pt with needed support for d/c.  PT will sign off.         If plan is discharge home, recommend the following: A little help with walking and/or transfers   Can travel by private vehicle        Equipment Recommendations None recommended by PT  Recommendations for Other Services       Functional Status Assessment Patient has had a recent decline in their functional status and demonstrates the ability to make significant improvements in function in a reasonable and predictable amount of time.     Precautions / Restrictions Precautions Precautions: Fall      Mobility  Bed Mobility               General bed mobility comments: reports able to get up from bed, sometimes rushing due to bowel urgency    Transfers Overall transfer level: Needs assistance Equipment used: Rolling walker (2 wheels) Transfers: Sit to/from Stand Sit to Stand: Contact guard assist           General transfer comment: struggles up from deep recliner CGA for more  anterior weight shift    Ambulation/Gait Ambulation/Gait assistance: Supervision Gait Distance (Feet): 120 Feet   Gait Pattern/deviations: Step-through pattern, Decreased stride length, Trunk flexed       General Gait Details: no LOB with walker in hallway, flexed posture at baseline  Stairs            Wheelchair Mobility     Tilt Bed    Modified Rankin (Stroke Patients Only)       Balance Overall balance assessment: Needs assistance   Sitting balance-Leahy Scale: Good       Standing balance-Leahy Scale: Fair Standing balance comment: static standing no device                             Pertinent Vitals/Pain Pain Assessment Pain Assessment: No/denies pain    Home Living Family/patient expects to be discharged to:: Private residence Living Arrangements: Spouse/significant other;Children Available Help at Discharge: Family;Available 24 hours/day Type of Home: House Home Access: Stairs to enter     Alternate Level Stairs-Number of Steps: 1 step from daughter's home, has garage downstairs Home Layout: Able to live on main level with bedroom/bathroom;Multi-level Home Equipment: Rolling Walker (2 wheels);Cane - single point;Rollator (4 wheels);Grab bars - tub/shower;Grab bars - toilet;Shower seat - built in;BSC/3in1;Wheelchair - manual;Hand held shower head      Prior Function Prior Level of Function : Independent/Modified Independent  Mobility Comments: furniture walks at home       Extremity/Trunk Assessment   Upper Extremity Assessment Upper Extremity Assessment: Overall WFL for tasks assessed    Lower Extremity Assessment Lower Extremity Assessment: Overall WFL for tasks assessed    Cervical / Trunk Assessment Cervical / Trunk Assessment: Kyphotic  Communication   Communication Communication: No apparent difficulties    Cognition Arousal: Alert Behavior During Therapy: WFL for tasks assessed/performed   PT  - Cognitive impairments: No apparent impairments                         Following commands: Intact       Cueing Cueing Techniques: Verbal cues     General Comments General comments (skin integrity, edema, etc.): daughter present and supportive; pt declines any post acute rehab including HHPT reports knows what to do and has her exercise program she does daily, helps spouse with socks and shoes from a wooden chair; discussed sit to stand for HEP as well and daughter reports likely better from wooden chair due to recliner very deep; HR pre amb 81 bpm, post 91 bpm    Exercises     Assessment/Plan    PT Assessment Patient does not need any further PT services  PT Problem List         PT Treatment Interventions      PT Goals (Current goals can be found in the Care Plan section)  Acute Rehab PT Goals PT Goal Formulation: All assessment and education complete, DC therapy    Frequency       Co-evaluation               AM-PAC PT 6 Clicks Mobility  Outcome Measure Help needed turning from your back to your side while in a flat bed without using bedrails?: A Little Help needed moving from lying on your back to sitting on the side of a flat bed without using bedrails?: A Little Help needed moving to and from a bed to a chair (including a wheelchair)?: A Little Help needed standing up from a chair using your arms (e.g., wheelchair or bedside chair)?: A Little Help needed to walk in hospital room?: A Little Help needed climbing 3-5 steps with a railing? : A Little 6 Click Score: 18    End of Session Equipment Utilized During Treatment: Gait belt Activity Tolerance: Patient tolerated treatment well Patient left: in chair;with call bell/phone within reach;with family/visitor present   PT Visit Diagnosis: Difficulty in walking, not elsewhere classified (R26.2)    Time: 9063-9046 PT Time Calculation (min) (ACUTE ONLY): 17 min   Charges:   PT Evaluation $PT  Eval Low Complexity: 1 Low   PT General Charges $$ ACUTE PT VISIT: 1 Visit         Andrea Brooks, PT Acute Rehabilitation Services Office:[phone] 11/13/2024   Andrea Brooks 11/13/2024, 10:02 AM

## 2024-11-13 NOTE — TOC Initial Note (Signed)
 Transition of Care [facility]) - Initial/Assessment Note    Patient Details  Name: Andrea Brooks MRN: [id number] Date of Birth: 1936/11/05  Transition [facility] Care [facility]) CM/SW Contact:    Jeoffrey LITTIE Moose, [place] Phone Number: 11/13/2024, 9:07 AM  Clinical Narrative:                 Pt admitted from home with spouse due to dark brown emesis. No current CM needs, please consult as needs arise following therapy eval.    Barriers to Discharge: Continued Medical Work up   Patient Goals and CMS Choice            Expected Discharge Plan and Services       Living arrangements for the past 2 months: Single Family Home                                      Prior Living Arrangements/Services Living arrangements for the past 2 months: Single Family Home Lives with:: Spouse Patient language and need for interpreter reviewed:: Yes Do you feel safe going back to the place where you live?: Yes      Need for Family Participation in Patient Care: Yes (Comment) Care giver support system in place?: Yes (comment)   Criminal Activity/Legal Involvement Pertinent to Current Situation/Hospitalization: No - Comment as needed  Activities of Daily Living   ADL Screening (condition at time of admission) Independently performs ADLs?: Yes (appropriate for developmental age) Is the patient deaf or have difficulty hearing?: No Does the patient have difficulty seeing, even when wearing glasses/contacts?: No Does the patient have difficulty concentrating, remembering, or making decisions?: No  Permission Sought/Granted Permission sought to share information with : Family Supports    Share Information with NAME: Helayne  Permission granted to share info w AGENCY: Spouse     Permission granted to share info w Contact Information: [phone]  Emotional Assessment Appearance:: Appears stated age Attitude/Demeanor/Rapport: Engaged Affect (typically observed): Pleasant Orientation: : Oriented to  Self, Oriented to Place, Oriented to  Time, Oriented to Situation Alcohol / Substance Use: Not Applicable Psych Involvement: No (comment)  Admission diagnosis:  Small bowel obstruction (HCC) [K56.609] SBO (small bowel obstruction) (HCC) [K56.609] Small bowel obstruction due to adhesions (HCC) [K56.50] Patient Active Problem List   Diagnosis Date Noted   Small bowel obstruction due to adhesions (HCC) 11/11/2024   Pneumatosis cystoides intestinalis 11/11/2024   Heart failure with preserved ejection fraction (HCC) 11/11/2024   Small bowel adhesions 07/21/2024   Dehydration 07/21/2024   Shock (HCC) 07/19/2024   Hypomagnesemia 07/15/2024   Hypocalcemia 08/13/2023   Hypokalemia 08/04/2023   Small intestinal bacterial overgrowth (SIBO) 04/11/2021   Bowel perforation (HCC) 03/11/2021   Chronic diastolic CHF (congestive heart failure) (HCC) 03/11/2021   Bloating 01/24/2021   Flatulence 01/24/2021   External hemorrhoids 01/24/2021   Chronic thoracic back pain 01/03/2019   Elevated troponin 07/02/2018   Diabetes mellitus without complication (HCC) 06/15/2018   Diverticulitis-  mild symptoms 03/30/2018   Irritable bowel syndrome with diarrhea 03/30/2018   Hyperlipidemia 04/22/2017   GERD (gastroesophageal reflux disease) 04/22/2017   Impaired glucose metabolism 04/22/2017   Hypertension associated with diabetes (HCC) 04/22/2017   Anxiety 04/22/2017   PCP:  Chandra Toribio POUR, MD Pharmacy:   [facility] - [place], [place] - [address] [place] [address] Phone: [phone] Fax: [phone]  [facility] [address] [place] [address] Phone: [phone] Fax: [phone]  [facility] - [place], [place] - [address] [place] [address] Phone: [phone] Fax: [phone]  [facility] - [place], [place] - [address] [facility] AT  Portal to Registered [address] [place] [address] Phone: [phone] Fax: [phone]     Social Drivers of Health (SDOH) Social History: SDOH Screenings   Food Insecurity: No Food Insecurity (11/11/2024)  Housing: Low Risk (11/11/2024)  Transportation Needs: No Transportation Needs (11/11/2024)  Utilities: Not At Risk (11/11/2024)  Alcohol Screen: Low Risk (07/27/2024)  Depression (PHQ2-9): Low Risk (10/31/2024)  Financial Resource Strain: Low Risk (07/27/2024)  Physical Activity: Inactive (07/27/2024)  Social Connections: Moderately Isolated (11/11/2024)  Stress: No Stress Concern Present (07/27/2024)  Tobacco Use: Medium Risk (11/11/2024)  Health Literacy: Adequate Health Literacy (07/27/2024)   SDOH Interventions:     Readmission Risk Interventions    07/24/2024   10:33 AM 07/20/2024   12:21 PM  Readmission Risk Prevention Plan  Transportation Screening Complete Complete  PCP or Specialist Appt within 5-7 Days Complete Complete  Home Care Screening Complete Complete  Medication Review (RN CM) Complete Referral to Pharmacy

## 2024-11-13 NOTE — Discharge Summary (Addendum)
 " Physician Discharge Summary   Patient: Andrea Brooks MRN: [id number] DOB: [birth year]  Admit date:     11/11/2024  Discharge date: 11/13/2024  Discharge Physician: Nydia Distance, MD    PCP: Chandra Toribio POUR, MD   Recommendations at discharge:   Continue Protonix  40 mg daily before breakfast Soft diet Continue to follow GI at [facility] and she is scheduled for imaging on 12/31.  Discharge Diagnoses:    Small bowel obstruction due to adhesions (HCC)   Pneumatosis cystoides intestinalis   Heart failure with preserved ejection fraction (HCC)   Hypertension associated with diabetes (HCC)   Hyperlipidemia   Anxiety   GERD (gastroesophageal reflux disease) Chronic hypomagnesemia    Hospital Course:  Patient is a 88 year old female with HTN, HLP, diabetes mellitus type 2, pneumatosis  intestinalis cystoides with prior history of small bowel obstruction, GERD, and obesity presented to ED with abdominal pain, nausea and vomiting. She has experienced a weight loss of five pounds since November 06, 2024, and has been having difficulty eating, with her last substantial meal being on Thanksgiving.  She maintains an appetite, but is only able to have small meals here and there.  Recently she has not had a bowel movement since the day before yesterday, which was diarrhea.    She has a history of pneumatosis intestinalis cystoides and has been treated at [facility]. She has been on Flagyl  and Mestinon , which has been somewhat successful.  She is had several abdominal surgeries previously. She has experienced recurrent episodes of vomiting and diarrhea since 2019 or 2020, following an E. coli infection from [employer].   She experiences episodes of vomiting, with the vomit described as brown in color, but no blood. She has been on Mestinon  to aid bowel movement due to a 'lazy' small intestine, but was recently taken off it to observe her response. She reports no urge to have a bowel movement since  discontinuing Mestinon . CT abdomen pelvis noted a small bowel obstruction secondary to multiple areas of small bowel adhesions and sigmoid diverticulosis. General surgery was consulted    Assessment and Plan:  Small bowel obstruction due to adhesions [facility]) - previous laparotomy for pneumatosis - Patient was placed on n.p.o. status, IV fluids, NGT, antiemetics.  General surgery was consulted. -Patient developed, NGT was clamped, tolerated clear diet. -Diet was advanced, now tolerating soft diet without any difficulty. - Per surgery, do not recommend surgery given resolution of obstruction.  Patient is a competent of chronic dysmotility and sees GI at [facility], scheduled for imaging on 12/31. - Cleared by general surgery to discharge home.     Pneumatosis intestinalis cystoides Patient followed at [facility] in regards to this issue.  Patient has had several abdominal surgeries, on metronidazole  for the last 2 years along with Mestinon  which was recently discontinued by the provider at [facility]. -  Continue metronidazole  p.o., follow-up patient with GI at [facility]   HFpEF /chronic diastolic CHF 2D echo 07/20/2024 showed EF 60 to 65% with indeterminate diastolic parameters 07/20/2024.   - Stable, euvolemic   Essential hypertension - Continue Lopressor .   Hyperlipidemia - Resume statin   Anxiety - Continue Ativan  as needed   GERD - Continue Protonix   Chronic hypomagnesemia -Patient is scheduled to have IV magnesium  at the infusion center.  Magnesium  was checked, 1.4 and hence given IV magnesium  inpatient   Estimated body mass index is 23.51 kg/m as calculated from the following:   Height as of this encounter: 5' 3 (1.6  m).   Weight as of this encounter: 60.2 kg.      Pain control - [place]  Controlled Substance Reporting System database was reviewed. and patient was instructed, not to drive, operate heavy machinery, perform activities at heights, swimming or participation in water activities  or provide baby-sitting services while on Pain, Sleep and Anxiety Medications; until their outpatient Physician has advised to do so again. Also recommended to not to take more than prescribed Pain, Sleep and Anxiety Medications.  Consultants: General Surgery Procedures performed: None Disposition: Home Diet recommendation: Soft diet  DISCHARGE MEDICATION: Allergies as of 11/13/2024       Reactions   Tape Other (See Comments)   Skin tears easily.  OK with paper tape.   Magnesium -containing Compounds Diarrhea, Nausea And Vomiting   Can take Vitamins w/ magnesium  Does not tolerate po magnesium  by itself        Medication List     PAUSE taking these medications    pyridostigmine  60 MG tablet Wait to take this until your doctor or other care provider tells you to start again. Commonly known as: MESTINON  Take 30 mg by mouth daily.   valsartan  320 MG tablet Wait to take this until your doctor or other care provider tells you to start again. Commonly known as: DIOVAN  Take 1 tablet (320 mg total) by mouth daily.       TAKE these medications    Acetaminophen  Extra Strength 500 MG Tabs Take 1 tablet (500 mg total) by mouth every 8 (eight) hours as needed for moderate pain.   aspirin  EC 81 MG tablet Take 81 mg by mouth daily.   calcium  carbonate 500 MG chewable tablet Commonly known as: TUMS - dosed in mg elemental calcium  Chew 2 tablets (400 mg of elemental calcium  total) by mouth daily as needed for indigestion or heartburn.   LORazepam  1 MG tablet Commonly known as: ATIVAN  TAKE 1/2 TO 1 TABLET AS    NEEDED FOR ANXIETY (MAXIMUM ONCE DAILY)   magnesium  sulfate 2 GM/50ML Soln infusion Infuse 2 grams IV at a rate of 1 gram per hour   metoprolol  tartrate 25 MG tablet Commonly known as: LOPRESSOR  Take 0.5 tablets (12.5 mg total) by mouth 2 (two) times daily.   metroNIDAZOLE  250 MG tablet Commonly known as: FLAGYL  Take 250 mg by mouth every morning.   Multi For Her  50+ Tabs Take 1 tablet by mouth daily.   ondansetron  4 MG disintegrating tablet Commonly known as: ZOFRAN -ODT Take 1 tablet (4 mg total) by mouth every 8 (eight) hours as needed for nausea or vomiting.   pantoprazole  40 MG tablet Commonly known as: Protonix  Take 1 tablet (40 mg total) by mouth daily before breakfast.   polyethylene glycol powder 17 GM/SCOOP powder Commonly known as: GLYCOLAX /MIRALAX  Take 17 g by mouth daily.   simvastatin  20 MG tablet Commonly known as: ZOCOR  Take 1 tablet (20 mg total) by mouth at bedtime.        Follow-up Information     Chandra Toribio POUR, MD. Schedule an appointment as soon as possible for a visit in 2 week(s).   Specialty: Family Medicine Why: for hospital follow-up Contact information: [address] [place] [address] [phone]         Chandra Toribio POUR, MD Follow up.   Specialty: Family Medicine Why: TIME : 10:30 AM DATE: DECEMBER 29 , 2025 MONDAY  PLEASE BRING ALL CURRENT MEDICATION,ID and INS CARD,CO-PAY Contact information: [address] [place]  [address] [phone]                Discharge Exam: Filed Weights   11/11/24 0956 11/12/24 0500 11/13/24 0500  Weight: 55.3 kg 60.2 kg 61.7 kg   S:  no acute complaints, had a BM today.  Tolerating soft diet without any difficulty.  States needed IV magnesium  that she is scheduled for at the outpatient infusion center tomorrow.  BP (!) 125/48 (BP Location: Left Arm)   Pulse 74   Temp (!) 97.5 F (36.4 C) (Oral)   Resp 18   Ht 5' 3 (1.6 m)   Wt 61.7 kg   SpO2 100%   BMI 24.10 kg/m   Physical Exam General: Alert and oriented x 3, NAD Cardiovascular: S1 S2 clear, RRR.  Respiratory: CTAB, no wheezing Gastrointestinal: Soft, nontender, nondistended, NBS Ext: no pedal edema bilaterally Neuro: no new deficits Psych: Normal affect    Condition at discharge: fair  The results of significant diagnostics from this hospitalization (including  imaging, microbiology, ancillary and laboratory) are listed below for reference.   Imaging Studies: DG Abd Portable 1V-Small Bowel Obstruction Protocol-initial, 8 hr delay Result Date: 11/12/2024 EXAM: 1 VIEW XRAY OF THE ABDOMEN 11/12/2024 07:14:00 AM COMPARISON: KUB dated 11/11/2024. CLINICAL HISTORY: FINDINGS: LINES, TUBES AND DEVICES: An enteric catheter remains with its tip along the greater curvature of the mid body of the stomach. BOWEL: There is now enteric contrast present within multiple small bowel loops, which remain mildly distended with both air and fluid. SOFT TISSUES: No abnormal calcifications. BONES: No acute fracture. IMPRESSION: 1. Mildly distended small bowel loops with air and fluid, now containing enteric contrast (compared to KUB 11/11/24). 2. Enteric catheter tip along the greater curvature of the mid body of the stomach. Electronically signed by: Evalene Coho MD 11/12/2024 07:37 AM EST RP Workstation: [id number]   DG Abd Portable 1V-Small Bowel Protocol-Position Verification Result Date: 11/11/2024 EXAM: 1 VIEW XRAY OF THE ABDOMEN 11/11/2024 07:08:00 PM COMPARISON: 07/21/2024 CLINICAL HISTORY: Encounter for imaging study to confirm nasogastric (NG) tube placement. FINDINGS: LINES, TUBES AND DEVICES: Enteric tube in place with side hole and tip projecting over expected location of gastric body. BOWEL: Diffuse gas-filled loops of bowel in upper abdomen, with small bowel measuring up to 5.5 cm. SOFT TISSUES: Right upper quadrant surgical clips noted. No abnormal calcifications. BONES: No acute fracture. LUNG BASES: Chronic interstitial changes of lung bases. IMPRESSION: 1. Enteric tube in place with side hole and tip projecting over expected location of gastric body. 2. Diffuse gas-filled loops of bowel in upper abdomen, with small bowel measuring up to 5.5 cm. Electronically signed by: Oneil Devonshire MD 11/11/2024 07:21 PM EST RP Workstation: [id number]   CT ABDOMEN PELVIS W  CONTRAST Result Date: 11/11/2024 CLINICAL DATA:  Concern for bowel obstruction. EXAM: CT ABDOMEN AND PELVIS WITH CONTRAST TECHNIQUE: Multidetector CT imaging of the abdomen and pelvis was performed using the standard protocol following bolus administration of intravenous contrast. RADIATION DOSE REDUCTION: This exam was performed according to the departmental dose-optimization program which includes automated exposure control, adjustment of the mA and/or kV according to patient size and/or use of iterative reconstruction technique. CONTRAST:  75mL OMNIPAQUE  IOHEXOL  350 MG/ML SOLN COMPARISON:  CT dated 07/19/2024. FINDINGS: Lower chest: Diffuse interstitial coarsening and fibrosis. There is mild cardiomegaly. No intra-abdominal free air or free fluid. Hepatobiliary: The liver is unremarkable. There is diffuse biliary dilatation, post cholecystectomy. No retained calcified stone noted in the central CBD. Pancreas: Unremarkable. No pancreatic ductal dilatation  or surrounding inflammatory changes. Spleen: Normal in size without focal abnormality. Adrenals/Urinary Tract: The adrenal glands unremarkable. Small left renal cyst. There is no hydronephrosis on either side. There is symmetric enhancement and excretion of contrast by both kidneys. The visualized ureters appear unremarkable. The urinary bladder is collapsed. Stomach/Bowel: There is a small hiatal hernia. There is dilatation of the visualized distal esophagus with liquid content may represent delayed clearance or reflux. There is diffuse small bowel dilatation with several areas of segmental thickening and narrowing likely representing adhesions and resulting in obstruction. For example focal area of thickening and adhesion in the anterior pelvis (58/3) likely related to peritoneal adhesion. Additional area of probable adhesion in the right lower abdomen (54/3). The small bowel measures up to approximately 6.5 cm in caliber. There is mild fecalization of the  distal ileum. There is sigmoid diverticulosis. The appendix is normal. Vascular/Lymphatic: Moderate aortoiliac atherosclerotic disease. The IVC is unremarkable. No portal gas. There is no adenopathy. Reproductive: Hysterectomy.  No suspicious adnexal masses. Other: None Musculoskeletal: Osteopenia with degenerative changes of the spine. No acute osseous pathology. IMPRESSION: 1. Small-bowel obstruction secondary to multiple areas of small-bowel adhesions. 2. Sigmoid diverticulosis. 3.  Aortic Atherosclerosis (ICD10-I70.0). Electronically Signed   By: Vanetta Chou M.D.   On: 11/11/2024 12:57   DG Chest 2 View Result Date: 11/11/2024 CLINICAL DATA:  Weakness. EXAM: DG CHEST 2V COMPARISON:  Chest radiograph dated 07/19/2024. FINDINGS: Shallow inspiration with bibasilar atelectasis. There is diffuse chronic interstitial coarsening and bronchitic changes. No focal consolidation pleural effusion or pneumothorax. Mild cardiomegaly. No acute osseous pathology. IMPRESSION: 1. No active cardiopulmonary disease. 2. Mild cardiomegaly. Electronically Signed   By: Vanetta Chou M.D.   On: 11/11/2024 11:40    Microbiology: Results for orders placed or performed during the hospital encounter of 07/19/24  Blood culture (routine x 2)     Status: None   Collection Time: 07/19/24  6:01 PM   Specimen: BLOOD  Result Value Ref Range Status   Specimen Description BLOOD SITE NOT SPECIFIED  Final   Special Requests   Final    BOTTLES DRAWN AEROBIC AND ANAEROBIC Blood Culture adequate volume   Culture   Final    NO GROWTH 5 DAYS Performed at [facility], [address], [place], [place] [address]    Report Status 07/24/2024 FINAL  Final  Blood culture (routine x 2)     Status: None   Collection Time: 07/19/24  8:16 PM   Specimen: BLOOD RIGHT ARM  Result Value Ref Range Status   Specimen Description BLOOD RIGHT ARM  Final   Special Requests   Final    BOTTLES DRAWN AEROBIC AND ANAEROBIC Blood Culture  adequate volume   Culture   Final    NO GROWTH 5 DAYS Performed at [facility], [address] [place], [place] [address]    Report Status 07/24/2024 FINAL  Final  MRSA Next Gen by PCR, Nasal     Status: None   Collection Time: 07/19/24  9:03 PM   Specimen: Nasal Mucosa; Nasal Swab  Result Value Ref Range Status   MRSA by PCR Next Gen NOT DETECTED NOT DETECTED Final    Comment: (NOTE) The GeneXpert MRSA Assay (FDA approved for NASAL specimens only), is one component of a comprehensive MRSA colonization surveillance program. It is not intended to diagnose MRSA infection nor to guide or monitor treatment for MRSA infections. Test performance is not FDA approved in patients less than 2  years old. Performed at [facility], [address], [place], [place] [address]     Labs: CBC: Recent Labs  Lab 11/11/24 0958 11/12/24 0500  WBC 8.6 9.9  NEUTROABS 7.1  --   HGB 12.5 10.4*  HCT 39.8 32.7*  MCV 93.2 92.6  PLT 253 199   Basic Metabolic Panel: Recent Labs  Lab 11/07/24 1010 11/11/24 0958 11/11/24 1905 11/12/24 0500 11/13/24 0455 11/13/24 0911  NA  --  141  --  143 140  --   K  --  4.3  --  4.6 3.8  --   CL  --  102  --  106 108  --   CO2  --  28  --  27 26  --   GLUCOSE  --  130*  --  102* 90  --   BUN  --  18  --  29* 14  --   CREATININE  --  0.96  --  0.93 0.72  --   CALCIUM   --  9.3  --  8.7* 8.0*  --   MG 1.8  --  1.7  --   --  1.4*   Liver Function Tests: Recent Labs  Lab 11/11/24 0958  AST 24  ALT 9  ALKPHOS 65  BILITOT 0.3  PROT 7.5  ALBUMIN 3.9   CBG: Recent Labs  Lab 11/11/24 1000  GLUCAP 129*    Discharge time spent: greater than 30 minutes.  Signed: Nydia Distance, MD [facility] 11/13/2024 "

## 2024-11-13 NOTE — TOC CM/SW Note (Signed)
 Transition of Care [facility]) - Inpatient Brief Assessment   Patient Details  Name: Andrea Brooks MRN: [id number] Date of Birth: January 15, 1936  Transition of Care [facility]) CM/SW Contact:    Sudie Erminio Deems, RN Phone Number: 11/13/2024, 10:09 AM   Clinical Narrative: Patient presented for abdominal pain, nausea, and vomiting. Patient has PCP Dr. Chandra. CMA to schedule a PCP appointment and place information on the AVS. PT consulted and made no recommendations for PT follow up. No further needs identified at this time.    Transition of Care Asessment: Insurance and Status: Insurance coverage has been reviewed Patient has primary care physician: Yes Home environment has been reviewed: revieewed Prior/Current Home Services: No current home services Social Drivers of Health Review: SDOH reviewed no interventions necessary Readmission risk has been reviewed: Yes Transition of care needs: no transition of care needs at this time

## 2024-11-13 NOTE — Progress Notes (Signed)
 Pt currently receiving MAG IV. It will finish at 1615 and then patient can be discharged.

## 2024-11-13 NOTE — Progress Notes (Addendum)
 "      Subjective: Patient has continued to have bowel movements and is passing flatus. Tolerating clear liquids. Denies abdominal pain, nausea and vomiting.    Objective: Vital signs in last 24 hours: Temp:  [97.5 F (36.4 C)-98 F (36.7 C)] 97.5 F (36.4 C) (12/22 1025) Pulse Rate:  [66-84] 74 (12/22 1025) Resp:  [14-18] 18 (12/22 1025) BP: (120-132)/(47-54) 125/48 (12/22 1025) SpO2:  [96 %-100 %] 100 % (12/22 1025) Weight:  [61.7 kg] 61.7 kg (12/22 0500) Last BM Date : 11/12/24  Intake/Output from previous day: 12/21 0701 - 12/22 0700 In: 1815 [P.O.:60; I.V.:1755] Out: 50 [Emesis/NG output:50] Intake/Output this shift: No intake/output data recorded.  PE: General: resting comfortably, NAD HEENT: NG in place, clamped Neuro: alert and oriented, no focal deficits Resp: normal work of breathing on room air Abdomen: soft, nondistended, nontender to palpation. Extremities: warm and well-perfused   Lab Results:  Recent Labs    11/11/24 0958 11/12/24 0500  WBC 8.6 9.9  HGB 12.5 10.4*  HCT 39.8 32.7*  PLT 253 199   BMET Recent Labs    11/12/24 0500 11/13/24 0455  NA 143 140  K 4.6 3.8  CL 106 108  CO2 27 26  GLUCOSE 102* 90  BUN 29* 14  CREATININE 0.93 0.72  CALCIUM  8.7* 8.0*   PT/INR No results for input(s): LABPROT, INR in the last 72 hours. CMP     Component Value Date/Time   NA 140 11/13/2024 0455   NA 141 10/31/2024 1150   K 3.8 11/13/2024 0455   CL 108 11/13/2024 0455   CO2 26 11/13/2024 0455   GLUCOSE 90 11/13/2024 0455   BUN 14 11/13/2024 0455   BUN 8 10/31/2024 1150   CREATININE 0.72 11/13/2024 0455   CALCIUM  8.0 (L) 11/13/2024 0455   PROT 7.5 11/11/2024 0958   PROT 7.0 09/19/2024 1104   ALBUMIN 3.9 11/11/2024 0958   ALBUMIN 3.7 09/19/2024 1104   AST 24 11/11/2024 0958   ALT 9 11/11/2024 0958   ALKPHOS 65 11/11/2024 0958   BILITOT 0.3 11/11/2024 0958   BILITOT 0.3 09/19/2024 1104   GFRNONAA >60 11/13/2024 0455   GFRAA 74  11/11/2020 0903   Lipase     Component Value Date/Time   LIPASE 52 (H) 11/11/2024 1223       Studies/Results: DG Abd Portable 1V-Small Bowel Obstruction Protocol-initial, 8 hr delay Result Date: 11/12/2024 EXAM: 1 VIEW XRAY OF THE ABDOMEN 11/12/2024 07:14:00 AM COMPARISON: KUB dated 11/11/2024. CLINICAL HISTORY: FINDINGS: LINES, TUBES AND DEVICES: An enteric catheter remains with its tip along the greater curvature of the mid body of the stomach. BOWEL: There is now enteric contrast present within multiple small bowel loops, which remain mildly distended with both air and fluid. SOFT TISSUES: No abnormal calcifications. BONES: No acute fracture. IMPRESSION: 1. Mildly distended small bowel loops with air and fluid, now containing enteric contrast (compared to KUB 11/11/24). 2. Enteric catheter tip along the greater curvature of the mid body of the stomach. Electronically signed by: Evalene Coho MD 11/12/2024 07:37 AM EST RP Workstation: [id number]   DG Abd Portable 1V-Small Bowel Protocol-Position Verification Result Date: 11/11/2024 EXAM: 1 VIEW XRAY OF THE ABDOMEN 11/11/2024 07:08:00 PM COMPARISON: 07/21/2024 CLINICAL HISTORY: Encounter for imaging study to confirm nasogastric (NG) tube placement. FINDINGS: LINES, TUBES AND DEVICES: Enteric tube in place with side hole and tip projecting over expected location of gastric body. BOWEL: Diffuse gas-filled loops of bowel in upper abdomen, with small bowel measuring  up to 5.5 cm. SOFT TISSUES: Right upper quadrant surgical clips noted. No abnormal calcifications. BONES: No acute fracture. LUNG BASES: Chronic interstitial changes of lung bases. IMPRESSION: 1. Enteric tube in place with side hole and tip projecting over expected location of gastric body. 2. Diffuse gas-filled loops of bowel in upper abdomen, with small bowel measuring up to 5.5 cm. Electronically signed by: Oneil Devonshire MD 11/11/2024 07:21 PM EST RP Workstation: [id number]   CT  ABDOMEN PELVIS W CONTRAST Result Date: 11/11/2024 CLINICAL DATA:  Concern for bowel obstruction. EXAM: CT ABDOMEN AND PELVIS WITH CONTRAST TECHNIQUE: Multidetector CT imaging of the abdomen and pelvis was performed using the standard protocol following bolus administration of intravenous contrast. RADIATION DOSE REDUCTION: This exam was performed according to the departmental dose-optimization program which includes automated exposure control, adjustment of the mA and/or kV according to patient size and/or use of iterative reconstruction technique. CONTRAST:  75mL OMNIPAQUE  IOHEXOL  350 MG/ML SOLN COMPARISON:  CT dated 07/19/2024. FINDINGS: Lower chest: Diffuse interstitial coarsening and fibrosis. There is mild cardiomegaly. No intra-abdominal free air or free fluid. Hepatobiliary: The liver is unremarkable. There is diffuse biliary dilatation, post cholecystectomy. No retained calcified stone noted in the central CBD. Pancreas: Unremarkable. No pancreatic ductal dilatation or surrounding inflammatory changes. Spleen: Normal in size without focal abnormality. Adrenals/Urinary Tract: The adrenal glands unremarkable. Small left renal cyst. There is no hydronephrosis on either side. There is symmetric enhancement and excretion of contrast by both kidneys. The visualized ureters appear unremarkable. The urinary bladder is collapsed. Stomach/Bowel: There is a small hiatal hernia. There is dilatation of the visualized distal esophagus with liquid content may represent delayed clearance or reflux. There is diffuse small bowel dilatation with several areas of segmental thickening and narrowing likely representing adhesions and resulting in obstruction. For example focal area of thickening and adhesion in the anterior pelvis (58/3) likely related to peritoneal adhesion. Additional area of probable adhesion in the right lower abdomen (54/3). The small bowel measures up to approximately 6.5 cm in caliber. There is mild  fecalization of the distal ileum. There is sigmoid diverticulosis. The appendix is normal. Vascular/Lymphatic: Moderate aortoiliac atherosclerotic disease. The IVC is unremarkable. No portal gas. There is no adenopathy. Reproductive: Hysterectomy.  No suspicious adnexal masses. Other: None Musculoskeletal: Osteopenia with degenerative changes of the spine. No acute osseous pathology. IMPRESSION: 1. Small-bowel obstruction secondary to multiple areas of small-bowel adhesions. 2. Sigmoid diverticulosis. 3.  Aortic Atherosclerosis (ICD10-I70.0). Electronically Signed   By: Vanetta Chou M.D.   On: 11/11/2024 12:57   DG Chest 2 View Result Date: 11/11/2024 CLINICAL DATA:  Weakness. EXAM: DG CHEST 2V COMPARISON:  Chest radiograph dated 07/19/2024. FINDINGS: Shallow inspiration with bibasilar atelectasis. There is diffuse chronic interstitial coarsening and bronchitic changes. No focal consolidation pleural effusion or pneumothorax. Mild cardiomegaly. No acute osseous pathology. IMPRESSION: 1. No active cardiopulmonary disease. 2. Mild cardiomegaly. Electronically Signed   By: Vanetta Chou M.D.   On: 11/11/2024 11:40      Assessment/Plan 88 yo female with a history of small bowel dysmotility with previous laparotomy for pneumatosis, presenting with nausea/vomiting and small bowel obstruction.  - Obstruction has resolved and patient is having bowel function. - Advance to soft diet. - Do not recommend surgery given resolution of adhesive obstruction. Patient has a component of chronic dysmotility. She sees GI at [facility] and reports she is scheduled for imaging there on 12/31. I encouraged her to keep this appointment to continue her dysmotility workup. - Patient may  be discharged later today from a surgical standpoint if she tolerates a soft diet. Discussed with Dr. Davia. Surgery will sign off. Please call with any further questions or concerns.     LOS: 2 days    Andrea Dawn, MD [facility] Surgery General, Hepatobiliary and Pancreatic Surgery 11/13/2024 10:39 AM  "

## 2024-11-14 ENCOUNTER — Telehealth: Payer: Self-pay

## 2024-11-14 ENCOUNTER — Other Ambulatory Visit

## 2024-11-14 ENCOUNTER — Ambulatory Visit (HOSPITAL_COMMUNITY)
Admission: RE | Admit: 2024-11-14 | Discharge: 2024-11-14 | Disposition: A | Discharge status: Home / Self Care | Source: Ambulatory Visit | Attending: Family Medicine | Admitting: Family Medicine

## 2024-11-14 NOTE — Transitions of Care (Post Inpatient/ED Visit) (Signed)
 "  11/14/2024  Name: Andrea Brooks MRN: [id number] DOB: 1935/12/20  Today's TOC FU Call Status: Today's TOC FU Call Status:: Successful TOC FU Call Completed TOC FU Call Complete Date: 11/14/24  Patient's Name and Date of Birth confirmed. Name, DOB  Transition Care Management Follow-up Telephone Call Date of Discharge: 11/13/24 Discharge Facility: [facility]) Type of Discharge: Inpatient Admission Primary Inpatient Discharge Diagnosis:: Small bowel obstruction due to adhesions How have you been since you were released from the hospital?: Better Any questions or concerns?: No  Items Reviewed: Did you receive and understand the discharge instructions provided?: Yes Medications obtained,verified, and reconciled?: Yes (Medications Reviewed) Any new allergies since your discharge?: No Dietary orders reviewed?: Yes Type of Diet Ordered:: soft diet Do you have support at home?: Yes People in Home [RPT]: spouse  Medications Reviewed Today: Medications Reviewed Today     Reviewed by Rumalda Alan PENNER, RN (Registered Nurse) on 11/14/24 at [phone]  Med List Status: <None>   Medication Order Taking? Sig Documenting Provider Last Dose Status Informant  acetaminophen  (TYLENOL ) 500 MG tablet [id number] Yes Take 1 tablet (500 mg total) by mouth every 8 (eight) hours as needed for moderate pain. Singh, Prashant K, MD  Active Self  aspirin  EC 81 MG tablet [id number] Yes Take 81 mg by mouth daily. [provider]  Active Self  calcium  carbonate (TUMS - DOSED IN MG ELEMENTAL CALCIUM ) 500 MG chewable tablet [id number] Yes Chew 2 tablets (400 mg of elemental calcium  total) by mouth daily as needed for indigestion or heartburn. Odell Celinda Balo, MD  Active Self  LORazepam  (ATIVAN ) 1 MG tablet [id number]  TAKE 1/2 TO 1 TABLET AS    NEEDED FOR ANXIETY (MAXIMUM ONCE DAILY)  Patient not taking: Reported on 11/14/2024   Chandra Toribio POUR, MD  Active Self  magnesium  sulfate 2 GM/50ML SOLN infusion  [id number]  Infuse 2 grams IV at a rate of 1 gram per hour  Patient not taking: Reported on 11/14/2024   Chandra Toribio POUR, MD  Active Self           Med Note JACKOLYN WADDELL VEAR Pablo Nov 13, 2024 10:06 AM) Infusions. Currently receiving once weekly. Is on a PRN basis.   metoprolol  tartrate (LOPRESSOR ) 25 MG tablet [id number] Yes Take 0.5 tablets (12.5 mg total) by mouth 2 (two) times daily. West, Katlyn D, NP  Active Self  metroNIDAZOLE  (FLAGYL ) 250 MG tablet [id number] Yes Take 250 mg by mouth every morning. [provider]  Active Self  Multiple Vitamins-Minerals (MULTI FOR HER 50+) TABS [id number] Yes Take 1 tablet by mouth daily. [provider]  Active Self  ondansetron  (ZOFRAN -ODT) 4 MG disintegrating tablet [id number] Yes Take 1 tablet (4 mg total) by mouth every 8 (eight) hours as needed for nausea or vomiting. Rai, Nydia POUR, MD  Active   pantoprazole  (PROTONIX ) 40 MG tablet [id number] Yes Take 1 tablet (40 mg total) by mouth daily before breakfast. Rai, Ripudeep K, MD  Active   polyethylene glycol powder (GLYCOLAX /MIRALAX ) 17 GM/SCOOP powder [id number] Yes Take 17 g by mouth daily. [provider]  Active Self  pyridostigmine  (MESTINON ) 60 MG tablet [id number]  Take 30 mg by mouth daily.  Patient not taking: Reported on 11/14/2024   [provider]  Active Self           Med Note JACKOLYN WADDELL VEAR Pablo Nov 13, 2024 10:08 AM) Is on hold for 2 weeks. Last dose 12/18.  simvastatin  (ZOCOR ) 20 MG tablet [id number] Yes Take 1 tablet (20 mg total) by mouth at bedtime. [name], MD  Active Self  valsartan  (DIOVAN ) 320 MG tablet [id number]  Take 1 tablet (320 mg total) by mouth daily.  Patient not taking: Reported on 11/14/2024   [name], MD  Active Self            Home Care and Equipment/Supplies: Were Home Health Services Ordered?: No Any new equipment or medical supplies ordered?: No  Functional Questionnaire: Do you need assistance  with bathing/showering or dressing?: No Do you need assistance with meal preparation?: No Do you need assistance with eating?: No Do you have difficulty maintaining continence: No Do you need assistance with getting out of bed/getting out of a chair/moving?: No Do you have difficulty managing or taking your medications?: No  Follow up appointments reviewed: PCP Follow-up appointment confirmed?: Yes Date of PCP follow-up appointment?: 11/20/24 Follow-up Provider: Dr. Chandra Specialist [facility] Follow-up appointment confirmed?: No Reason Specialist Follow-Up Not Confirmed: Patient has Specialist Provider Number and will Call for Appointment (AT [facility]) Do you need transportation to your follow-up appointment?: No Do you understand care options if your condition(s) worsen?: Yes-patient verbalized understanding  SDOH Interventions Today    Flowsheet Row Most Recent Value  SDOH Interventions   Food Insecurity Interventions Intervention Not Indicated  Housing Interventions Intervention Not Indicated  Transportation Interventions Intervention Not Indicated  Utilities Interventions Intervention Not Indicated   Today's Vitals   11/14/24 0953 11/14/24 0955  Weight: 126 lb 12.8 oz (57.5 kg)   PainSc:  0-No pain    Goals Addressed             This Visit's Progress    [facility] Transitions of Care (TOC) Care Plan       Problems:  Recent Hospitalization for treatment of Small bowel obstruction  Goal:  Over the next 30 days, the patient will not experience hospital readmission  Interventions:  Transitions of Care: Doctor Visits  - discussed the importance of doctor visits Reviewed Signs and symptoms of infection Reviewed all medication and encouraged patient to take as prescribed.  Reviewed options of soft diet.  Encouraged patient to be observant of bowel movements and color. Encouraged patient to monitor BP frequently since BP medication on hold. Encouraged patient to call MD for  abnormal readings. Reviewed pending follow up at [facility] Reviewed and offered 30 day TOC program and patient accepted. This note sent to PCP  Patient Self Care Activities:  Attend all scheduled provider appointments Call pharmacy for medication refills 3-7 days in advance of running out of medications Call provider office for new concerns or questions  Notify RN Care Manager of TOC call rescheduling needs Participate in Transition of Care Program/Attend TOC scheduled calls Perform all self care activities independently  Perform IADL's (shopping, preparing meals, housekeeping, managing finances) independently Take medications as prescribed    Plan:  Telephone follow up appointment with care management team member scheduled for:  11/21/2024         Alan Ee, RN, BSN, CEN Population Health- Transition of Care Team.  [facility] [phone]  "

## 2024-11-14 NOTE — Telephone Encounter (Signed)
 Copied from CRM #[id number]. Topic: General - Call Back - No Documentation >> Nov 14, 2024  1:20 PM Charlet HERO wrote: Reason for CRM: Patient daughter is returning call to [facility], please call the patient daughter back.

## 2024-11-14 NOTE — Addendum Note (Signed)
 Addended by: CHANDRA TORIBIO POUR on: 11/14/2024 06:40 AM   Modules accepted: Orders

## 2024-11-14 NOTE — Telephone Encounter (Signed)
 Do they mean in addition to the magnesium  labs?  I will order a bmp and cbc.

## 2024-11-20 ENCOUNTER — Encounter: Payer: Self-pay | Admitting: Family Medicine

## 2024-11-20 ENCOUNTER — Ambulatory Visit: Admitting: Family Medicine

## 2024-11-20 VITALS — BP 123/69 | HR 80 | Ht 63.0 in | Wt 127.4 lb

## 2024-11-20 DIAGNOSIS — T148XXA Other injury of unspecified body region, initial encounter: Secondary | ICD-10-CM | POA: Insufficient documentation

## 2024-11-20 DIAGNOSIS — K565 Intestinal adhesions [bands], unspecified as to partial versus complete obstruction: Secondary | ICD-10-CM

## 2024-11-20 DIAGNOSIS — I152 Hypertension secondary to endocrine disorders: Secondary | ICD-10-CM

## 2024-11-20 DIAGNOSIS — E1159 Type 2 diabetes mellitus with other circulatory complications: Secondary | ICD-10-CM

## 2024-11-20 NOTE — Assessment & Plan Note (Signed)
 Raw, oozy scab on tip of right nares post-NG tube removal. No infection signs. Using bacitracin ointment. - Apply bacitracin ointment at night. - Apply Vaseline in the morning. - Monitor for infection signs: increased redness, swelling, tenderness.

## 2024-11-20 NOTE — Assessment & Plan Note (Signed)
 Recent hospitalization for bowel obstruction due to adhesions. Improved bowel movements on soft diet. No current nausea, vomiting, or abdominal pain. Advised to avoid hard-to-digest foods. - Continue soft diet, avoid hard-to-digest foods like steak. - Monitor for bowel obstruction symptoms: abdominal pain, early satiety. - Use Zofran  for nausea if needed. - CT enterography with contrast on January 3rd.

## 2024-11-20 NOTE — Assessment & Plan Note (Signed)
 Previously on high dose valsartan , discontinued in the hospital due to hypotension. Has felt much better since stopping. Blood pressure well-managed without medication. Consider restarting valsartan  at a lower dose if systolic consistently exceeds 849 mmHg. - Hold valsartan  unless systolic BP exceeds 150 mmHg. - Monitor blood pressure regularly. - Discuss valsartan  restart with cardiologist at upcoming appt.

## 2024-11-20 NOTE — Progress Notes (Unsigned)
 "  Established Patient Office Visit  Subjective   Patient ID: Andrea Brooks, female    DOB: [birth year]  Age: 88 y.o. MRN: [id number]  Chief Complaint  Patient presents with   Hospitalization Follow-up    Discussed the use of AI scribe software for clinical note transcription with the patient, who gave verbal consent to proceed.  History of Present Illness   Andrea Brooks is an 88 year old female who presents for follow-up after hospitalization for bowel obstruction.  She was recently hospitalized for a bowel obstruction and is now on a soft diet. No nausea, vomiting, or abdominal pain have been noted since discharge. Bowel movements have returned to normal but are less frequent since discontinuing Mestinon , which was stopped in the hospital due to recurrent vomiting and diarrhea.  Her blood pressure medication was discontinued in the hospital due to low blood pressure readings. She feels better without it and has been monitoring her blood pressure at home, which has been stable since discontinuation.  She received magnesium  supplementation before discharge and is scheduled for regular blood work to monitor magnesium  and calcium  levels. Her potassium levels were previously high, as noted by Dr. Deland, but were reported as okay at the last check per her MyChart.  She has a sore nostril from an NG tube insertion during her hospital stay, which she is treating with bacitracin ointment. It remains sore but is not worsening.  Her current diet includes soft foods, avoiding hard-to-digest items like steak, and she is consuming more liquids and protein shakes. She is cautious with fiber intake to prevent bowel obstruction recurrence.          The ASCVD Risk score (Arnett DK, et al., 2019) failed to calculate for the following reasons:   The 2019 ASCVD risk score is only valid for ages 44 to 76   Risk score cannot be calculated because patient has a medical history suggesting  prior/existing ASCVD   * - Cholesterol units were assumed  Health Maintenance Due  Topic Date Due   Pneumococcal Vaccine: 50+ Years (3 of 3 - PCV20 or PCV21) 11/30/2017   OPHTHALMOLOGY EXAM  09/15/2023   COVID-19 Vaccine (4 - 2025-26 season) 07/24/2024      Objective:     BP 123/69   Pulse 80   Ht 5' 3 (1.6 m)   Wt 127 lb 6.4 oz (57.8 kg)   SpO2 100%   BMI 22.57 kg/m  {Vitals History (Optional):23777}  Physical Exam   Gen: alert, oriented Pulm: no respiratory distress Psych: pleasant affect HEENT: small eschar overlying anterior right nares near mucosal border.        No results found for any visits on 11/20/24.      Assessment & Plan:   Hypertension associated with diabetes (HCC) Assessment & Plan: Previously on high dose valsartan , discontinued in the hospital due to hypotension. Has felt much better since stopping. Blood pressure well-managed without medication. Consider restarting valsartan  at a lower dose if systolic consistently exceeds 849 mmHg. - Hold valsartan  unless systolic BP exceeds 150 mmHg. - Monitor blood pressure regularly. - Discuss valsartan  restart with cardiologist at upcoming appt.     Skin abrasion Assessment & Plan: Raw, oozy scab on tip of right nares post-NG tube removal. No infection signs. Using bacitracin ointment. - Apply bacitracin ointment at night. - Apply Vaseline in the morning. - Monitor for infection signs: increased redness, swelling, tenderness.    Hypomagnesemia Assessment & Plan: Magnesium  and calcium  levels  require regular monitoring. Pt reports having high potassium recently but our lab results have shown normal K levels. High potassium likely due to kidney function and recent vomiting/diarrhea. - Standing orders for magnesium  and calcium  levels, can be drawn at her infusion appointments. - Add BMP to monitor potassium levels. - Continue weekly magnesium  infusions.   Small bowel obstruction due to adhesions  [facility]) Assessment & Plan: Recent hospitalization for bowel obstruction due to adhesions. Improved bowel movements on soft diet. No current nausea, vomiting, or abdominal pain. Advised to avoid hard-to-digest foods. - Continue soft diet, avoid hard-to-digest foods like steak. - Monitor for bowel obstruction symptoms: abdominal pain, early satiety. - Use Zofran  for nausea if needed. - CT enterography with contrast on January 3rd.               Return in about 3 months (around 02/18/2025) for already on file.    Toribio MARLA Slain, MD    "

## 2024-11-20 NOTE — Assessment & Plan Note (Signed)
 Magnesium  and calcium  levels require regular monitoring. Pt reports having high potassium recently but our lab results have shown normal K levels. High potassium likely due to kidney function and recent vomiting/diarrhea. - Standing orders for magnesium  and calcium  levels, can be drawn at her infusion appointments. - Add BMP to monitor potassium levels. - Continue weekly magnesium  infusions.

## 2024-11-20 NOTE — Patient Instructions (Signed)
" °  VISIT SUMMARY: You had a follow-up visit after your recent hospitalization for a bowel obstruction. Your bowel movements have improved on a soft diet, and your blood pressure has been stable without medication. We discussed your electrolyte levels, and you are continuing with magnesium  infusions. You are also treating a sore nostril from an NG tube insertion with bacitracin ointment.  YOUR PLAN: INTESTINAL ADHESIONS WITH RECURRENT BOWEL OBSTRUCTION: You were recently hospitalized for a bowel obstruction due to adhesions. Your bowel movements have improved on a soft diet, and you have no current nausea, vomiting, or abdominal pain. -Continue with a soft diet and avoid hard-to-digest foods like steak. -Monitor for symptoms of bowel obstruction such as abdominal pain and early satiety. -Use Zofran  for nausea if needed. -You have a CT enterography with contrast scheduled for January 3rd.  ELECTROLYTE DISTURBANCES (HYPOMAGNESEMIA, HYPOCALCEMIA, HYPERKALEMIA): Your magnesium  and calcium  levels need regular monitoring, and your potassium levels were previously high but are now okay. -You have standing orders for magnesium  and calcium  levels. -A BMP will be added to monitor your potassium levels. -Continue with weekly magnesium  infusions.  HYPERTENSION: Your blood pressure medication was stopped due to low readings, and your blood pressure has been stable without it. -Do not take valsartan  unless your systolic blood pressure exceeds 150 mmHg. -Monitor your blood pressure regularly. -Discuss restarting valsartan  with your cardiologist on January 7th.  NASAL MUCOSAL INJURY: You have a sore nostril from an NG tube insertion, which you are treating with bacitracin ointment. -Apply bacitracin ointment at night. -Apply Vaseline in the morning. -Monitor for signs of infection such as increased redness, swelling, or tenderness.    "

## 2024-11-21 ENCOUNTER — Telehealth: Payer: Self-pay

## 2024-11-21 ENCOUNTER — Ambulatory Visit (HOSPITAL_COMMUNITY)
Admission: RE | Admit: 2024-11-21 | Discharge: 2024-11-21 | Disposition: A | Discharge status: Home / Self Care | Source: Ambulatory Visit | Attending: Family Medicine | Admitting: Family Medicine

## 2024-11-21 LAB — MAGNESIUM: Magnesium: 1.8 mg/dL (ref 1.7–2.4)

## 2024-11-21 MED ORDER — MAGNESIUM SULFATE 2 GM/50ML IV SOLN
INTRAVENOUS | Status: AC
  Filled 2024-11-21: qty 50

## 2024-11-21 MED ORDER — MAGNESIUM SULFATE 2 GM/50ML IV SOLN
2.0000 g | Freq: Once | INTRAVENOUS | Status: AC
  Administered 2024-11-21: 2 g via INTRAVENOUS

## 2024-11-22 ENCOUNTER — Telehealth: Payer: Self-pay

## 2024-11-22 NOTE — Patient Instructions (Signed)
 Visit Information  Thank you for taking time to visit with me today. Please don't hesitate to contact me if I can be of assistance to you before our next scheduled telephone appointment.  Our next appointment is by telephone on 12/01/2024 at 10 am  Following is a copy of your care plan:   Goals Addressed             This Visit's Progress    [facility] Transitions of Care (TOC) Care Plan       Problems:  Recent Hospitalization for treatment of Small bowel obstruction  11/22/2024  Feeling well. Reports that she had episodes of bloating and diarrhea yesterday. Feels normal today.   Had magnesium  infusion.   Goal:  Over the next 30 days, the patient will not experience hospital readmission  Interventions:  Transitions of Care: Doctor Visits  - discussed the importance of doctor visits Reviewed Signs and symptoms of infection Reviewed all medication and encouraged patient to take as prescribed.  Encouraged bland diet today after episode of diarrhea and bloating yesterday.  Encouraged patient to be observant of bowel movements and color.- reports bowels back to normal color.  Encouraged patient to monitor BP frequently since BP medication on hold. Encouraged patient to call MD for abnormal readings.- has been self monitoring and BP in a good range.  Reviewed medications.  Is currently taking mirlax as needed depending on bowel function Encouraged hydration.  Reviewed pre transfusion magnesium  level yesterday.  Has a repeat infusion next week.  Patient Self Care Activities:  Attend all scheduled provider appointments Call pharmacy for medication refills 3-7 days in advance of running out of medications Call provider office for new concerns or questions  Notify RN Care Manager of TOC call rescheduling needs Participate in Transition of Care Program/Attend TOC scheduled calls Perform all self care activities independently  Perform IADL's (shopping, preparing meals, housekeeping, managing  finances) independently Take medications as prescribed   Eat a bland diet today due to GI upset yesterdat. Stay hydrated.   Plan:  Telephone follow up appointment with care management team member scheduled for:  12/01/2024 at 10 am.         Care plan and visit instructions communicated with the patient verbally today. Patient agrees to receive a copy in MyChart. Active MyChart status and patient understanding of how to access instructions and care plan via MyChart confirmed with patient.     Telephone follow up appointment with care management team member scheduled for:   12/01/2024 at 10 am  Please call the care guide team at [phone] if you need to cancel or reschedule your appointment.   Please call the Suicide and Crisis Lifeline: 988 call the [place]  National Suicide Prevention Lifeline: 586-527-9940 or TTY: 732-867-1017 TTY 417-387-5752) to talk to a trained counselor call 1-800-273-TALK (toll free, 24 hour hotline) call 911 if you are experiencing a Mental Health or Behavioral Health Crisis or need someone to talk to.  Alan Ee, RN, BSN, CEN [employer]- Transition of Care Team.  [facility] [phone]

## 2024-11-22 NOTE — Transitions of Care (Post Inpatient/ED Visit) (Signed)
 " Transition of Care week 2  Visit Note  11/22/2024  Name: Andrea Brooks MRN: [id number]          DOB: 03/16/1936  Situation: Patient enrolled in [facility] 30-day program. Visit completed with pt by telephone.   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 Reports having a CT this am for the GI MD at [facility].  Reports no problems until yesterday. States she ate some Chinese and then had terrible bloating and diarrhea.  Reports feels fine today.  She continues to monitor BP and her readings are within normal limits. BP meds remain on hold.  Has had her hospital follow up with PCP.  Patient Reported Symptoms: Cognitive Cognitive Status: Able to follow simple commands, Alert and oriented to person, place, and time, Normal speech and language skills      Neurological Neurological Review of Symptoms: Other: [place] Neurological Symptoms/Conditions [RPT]: energy has improved. Neurological Management Strategies: Routine screening Neurological Self-Management Outcome: 5 (very good)  HEENT HEENT Symptoms Reported: No symptoms reported      Cardiovascular Cardiovascular Symptoms Reported: No symptoms reported Does patient have uncontrolled Hypertension?: No Cardiovascular Management Strategies: Routine screening, Medication therapy Weight: 121 lb (54.9 kg)  Respiratory Respiratory Symptoms  Reported: No symptoms reported Respiratory Self-Management Outcome: 5 (very good)  Endocrine Endocrine Symptoms Reported: No symptoms reported Is patient diabetic?: No Endocrine Self-Management Outcome: 5 (very good)  Gastrointestinal Gastrointestinal Symptoms Reported: No symptoms reported Additional Gastrointestinal Details: no abdominal pain today.  Had a CT scan today.     reports normal color of stools at this time. Gastrointestinal Self-Management Outcome: 5 (very good)    Genitourinary Genitourinary Symptoms Reported: No symptoms reported    Integumentary Integumentary Symptoms Reported: No symptoms reported Skin Management Strategies: Routine screening  Musculoskeletal Musculoskelatal Symptoms Reviewed: No symptoms reported Musculoskeletal Management Strategies: Routine screening Musculoskeletal Self-Management Outcome: 5 (very good)      Psychosocial Psychosocial Symptoms Reported: No symptoms reported         Today's Vitals   11/22/24 1124  BP: 127/66  Pulse: 72  SpO2: 100%  Weight:    Pain Scale: 0-10 Pain Score: 0-No pain  Medications Reviewed Today     Reviewed by Rumalda Alan PENNER, RN (Registered Nurse) on 11/22/24 at 1122  Med List Status: <None>   Medication Order Taking? Sig Documenting Provider Last Dose Status Informant  acetaminophen  (TYLENOL ) 500 MG tablet [id number] Yes Take 1 tablet (500 mg total) by mouth every 8 (eight) hours as needed for moderate pain. Singh, Prashant K, MD  Active Self  aspirin  EC 81 MG tablet [id number] Yes Take 81 mg by mouth daily. [provider]  Active Self  calcium  carbonate (TUMS - DOSED IN MG ELEMENTAL CALCIUM ) 500 MG chewable tablet [id number] Yes Chew 2 tablets (400 mg of elemental calcium   total) by mouth daily as needed for indigestion or heartburn. Odell Celinda Balo, MD  Active Self  LORazepam  (ATIVAN ) 1 MG tablet [id number]  TAKE 1/2 TO 1 TABLET AS    NEEDED FOR ANXIETY (MAXIMUM ONCE DAILY)  Patient not taking:  Reported on 11/14/2024   Chandra Toribio POUR, MD  Active Self  magnesium  sulfate 2 GM/50ML SOLN infusion [id number]  Infuse 2 grams IV at a rate of 1 gram per hour  Patient not taking: Reported on 11/14/2024   Chandra Toribio POUR, MD  Active Self           Med Note JACKOLYN WADDELL VEAR Pablo Nov 13, 2024 10:06 AM) Infusions. Currently receiving once weekly. Is on a PRN basis.   metoprolol  tartrate (LOPRESSOR ) 25 MG tablet [id number] Yes Take 0.5 tablets (12.5 mg total) by mouth 2 (two) times daily. West, Katlyn D, NP  Active Self  metroNIDAZOLE  (FLAGYL ) 250 MG tablet [id number] Yes Take 250 mg by mouth every morning. [provider]  Active Self  Multiple Vitamins-Minerals (MULTI FOR HER 50+) TABS [id number] Yes Take 1 tablet by mouth daily. [provider]  Active Self  ondansetron  (ZOFRAN -ODT) 4 MG disintegrating tablet [id number] Yes Take 1 tablet (4 mg total) by mouth every 8 (eight) hours as needed for nausea or vomiting. Rai, Nydia POUR, MD  Active   pantoprazole  (PROTONIX ) 40 MG tablet [id number] Yes Take 1 tablet (40 mg total) by mouth daily before breakfast. Rai, Ripudeep K, MD  Active   polyethylene glycol powder (GLYCOLAX /MIRALAX ) 17 GM/SCOOP powder [id number] Yes Take 17 g by mouth daily. [provider]  Active Self  pyridostigmine  (MESTINON ) 60 MG tablet [id number]  Take 30 mg by mouth daily.  Patient not taking: Reported on 11/22/2024   [provider]  Active Self           Med Note JACKOLYN WADDELL VEAR Pablo Nov 13, 2024 10:08 AM) Is on hold for 2 weeks. Last dose 12/18.  simvastatin  (ZOCOR ) 20 MG tablet [id number] Yes Take 1 tablet (20 mg total) by mouth at bedtime. [name], MD  Active Self  valsartan  (DIOVAN ) 320 MG tablet [id number]  Take 1 tablet (320 mg total) by mouth daily.  Patient not taking: Reported on 11/22/2024   [name], MD  Active Self            Goals Addressed             This Visit's Progress    [facility] Transitions  of Care (TOC) Care Plan       Problems:  Recent Hospitalization for treatment of Small bowel obstruction  11/22/2024  Feeling well. Reports that she had episodes of bloating and diarrhea yesterday. Feels normal today.   Had magnesium  infusion.   Goal:  Over the next 30 days, the patient will not experience hospital readmission  Interventions:  Transitions of Care: Doctor Visits  - discussed the importance of doctor visits Reviewed Signs and symptoms of infection Reviewed all medication and encouraged patient to take as prescribed.  Encouraged bland diet today after episode of diarrhea and bloating yesterday.  Encouraged patient to be observant of bowel movements and color.- reports bowels back to normal color.  Encouraged patient to monitor BP frequently since BP medication on hold. Encouraged patient to call MD for abnormal readings.- has been self monitoring and BP in a good range.  Reviewed medications.  Is currently taking mirlax as needed depending on bowel function Encouraged  hydration.  Reviewed pre transfusion magnesium  level yesterday.  Has a repeat infusion next week.  Patient Self Care Activities:  Attend all scheduled provider appointments Call pharmacy for medication refills 3-7 days in advance of running out of medications Call provider office for new concerns or questions  Notify RN Care Manager of TOC call rescheduling needs Participate in Transition of Care Program/Attend TOC scheduled calls Perform all self care activities independently  Perform IADL's (shopping, preparing meals, housekeeping, managing finances) independently Take medications as prescribed   Eat a bland diet today due to GI upset yesterdat. Stay hydrated.   Plan:  Telephone follow up appointment with care management team member scheduled for:  12/01/2024 at 10 am.         Recommendation:   Continue Current Plan of Care  Follow Up Plan:   Telephone follow up appointment date/time:  12/01/2024  at 10 am  Alan Ee, RN, BSN, [employer]- Transition of Care Team.  [facility] [phone]    "

## 2024-11-28 ENCOUNTER — Encounter (HOSPITAL_COMMUNITY)
Admission: RE | Admit: 2024-11-28 | Discharge: 2024-11-28 | Disposition: A | Discharge status: Home / Self Care | Source: Ambulatory Visit | Attending: Family Medicine | Admitting: Family Medicine

## 2024-11-28 MED ORDER — MAGNESIUM SULFATE 2 GM/50ML IV SOLN
INTRAVENOUS | Status: AC
  Filled 2024-11-28: qty 50

## 2024-11-28 MED ORDER — MAGNESIUM SULFATE 2 GM/50ML IV SOLN
2.0000 g | Freq: Once | INTRAVENOUS | Status: AC
  Administered 2024-11-28: 2 g via INTRAVENOUS

## 2024-11-30 NOTE — Telephone Encounter (Addendum)
"  ° °  [facility] Telephone Note     Recommendations:  Andrea Brooks is a 89 y.o. female that has been following with me for abdominal pain, N/V, diarrhea.   I called the patient to discuss her CT enterography results showing multiple dilated loops of small bowel measuring up to 4.7 cm in diameter. Some of these loops are seen proximal to areas of short segment circumferential wall thickening/enhancement, concerning for inflammation and/or possible strictures resulting in at least a partial obstruction.  I discussed these findings with Dr. Brenita and Dr. Lucian and we ultimately decided to start budesonide given possibility of small bowel inflammation contributing to partial obstructions. We considered capsule endoscopy, though high likelihood of capsule retention. We would like to avoid surgery as well given her age and history of adhesive disease. We will start budesonide 9 mg daily for 3 months and then potentially reduce to 6 mg or 3 mg. If she improves with budesonide, she may need to be maintained on the lowest effective dose. I will have her follow up in clinic in 3 months before we begin the taper. I also asked her to send me a MyChart message in 1-2 weeks to let me know if symptoms have improved.   For her SIBO, I recommended continuing metronidazole  once daily. She developed neuropathy with BID dosing.   Patient expressed understanding and had all questions answered. Medications sent to [facility] per patient request.  Neil Polio Gastroenterology and Hepatology Fellow Pager: [phone] "

## 2024-12-01 ENCOUNTER — Other Ambulatory Visit: Payer: Self-pay

## 2024-12-01 NOTE — Patient Instructions (Signed)
 Visit Information  Thank you for taking time to visit with me today. Please don't hesitate to contact me if I can be of assistance to you before our next scheduled telephone appointment.  Following are the goals we discussed today:   Goals Addressed             This Visit's Progress    [facility] Transitions of Care (TOC) Care Plan       Problems:  Recent Hospitalization for treatment of Small bowel obstruction  11/22/2024  Feeling well. Reports that she had episodes of bloating and diarrhea yesterday. Feels normal today.   Had magnesium  infusion. 12/01/2024-  patient reports feeling well. Got her results of CT and spoke with [facility] MD and new RX for budesonide.  Waiting for delivery of medication.   Several days of loose stools but no pain. Concerns about not gaining weight.  Goal:  Over the next 30 days, the patient will not experience hospital readmission  Interventions:  Transitions of Care: Doctor Visits  - discussed the importance of doctor visits Reviewed Signs and symptoms of infection Reviewed all medication and encouraged patient to take as prescribed- reviewed pending start of new medication.  Encouraged bland diet, reviewed use of protein powder and or carination instant breakfast for additional calories.   Encouraged patient to be observant of bowel movements and color.- loose stools recently Encouraged patient to monitor BP frequently since BP medication on hold. Encouraged patient to call MD for abnormal readings.- has been self monitoring and BP in a good range. BP range 95-105/50-60.  Diovan  still on hold. Reviewed medications.  Is currently taking mirlax as needed depending on bowel function Encouraged hydration.  Offered a supportive listening ear as it is very frustrating trying to figure out what to eat- daughter is assisting.   Patient Self Care Activities:  Attend all scheduled provider appointments Call pharmacy for medication refills 3-7 days in advance of running out  of medications Call provider office for new concerns or questions  Notify RN Care Manager of TOC call rescheduling needs Participate in Transition of Care Program/Attend TOC scheduled calls Perform all self care activities independently  Perform IADL's (shopping, preparing meals, housekeeping, managing finances) independently Take medications as prescribed   Take new medication when it arrives Continues to add calories to diet  Plan:  Telephone follow up appointment with care management team member scheduled for:  12/08/2024 at 10 am.          Our next appointment is by telephone on 12/08/2024 at 10 am  Please call the care guide team at [phone] if you need to cancel or reschedule your appointment.   If you are experiencing a Mental Health or Behavioral Health Crisis or need someone to talk to, please call the Suicide and Crisis Lifeline: 988 call the [place]  National Suicide Prevention Lifeline: 763-387-7857 or TTY: 662-212-0866 TTY (339)248-2987) to talk to a trained counselor call 1-800-273-TALK (toll free, 24 hour hotline) call 911   Care plan and visit instructions communicated with the patient verbally today. Patient agrees to receive a copy in MyChart. Active MyChart status and patient understanding of how to access instructions and care plan via MyChart confirmed with patient.     Alan Ee, RN, BSN, CEN [employer]- Transition of Care Team.  [facility] [phone]

## 2024-12-01 NOTE — Transitions of Care (Post Inpatient/ED Visit) (Signed)
 " Transition of Care week 3  Visit Note  12/01/2024  Name: Andrea Brooks MRN: [id number]          DOB: [birth year]  Situation: Patient enrolled in [facility] 30-day program. Visit completed with patient by telephone.   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 No abdominal pain and no NV.  Taking it day by day.  Monitors BP at home and has been on the low side.  Patient Reported Symptoms: Cognitive Cognitive Status: Able to follow simple commands, Alert and oriented to person, place, and time, Normal speech and language skills      Neurological Neurological Review of Symptoms: No symptoms reported Neurological Management Strategies: Routine screening Neurological Self-Management Outcome: 5 (very good)  HEENT HEENT Symptoms Reported: No symptoms reported HEENT Management Strategies: Routine screening    Cardiovascular Cardiovascular Symptoms Reported: No symptoms reported Does patient have uncontrolled Hypertension?: No Cardiovascular Management Strategies: Routine screening, Medication therapy, Medical device Weight: 122 lb (55.3 kg) Cardiovascular Self-Management Outcome: 5 (very good)  Respiratory Respiratory Symptoms Reported: No symptoms reported Respiratory Self-Management Outcome: 5 (very good)  Endocrine Endocrine Symptoms Reported: No symptoms reported Is patient diabetic?:  No Endocrine Self-Management Outcome: 5 (very good)  Gastrointestinal Gastrointestinal Symptoms Reported: No symptoms reported Additional Gastrointestinal Details: no abdominal pain,loose stools recently. Gastrointestinal Management Strategies: Medication therapy Gastrointestinal Self-Management Outcome: 5 (very good)    Genitourinary Genitourinary Symptoms Reported: No symptoms reported    Integumentary Integumentary Symptoms Reported: No symptoms reported    Musculoskeletal Musculoskelatal Symptoms Reviewed: No symptoms reported        Psychosocial Psychosocial Symptoms Reported: Not assessed         Today's Vitals   12/01/24 1017  BP: (!) 95/52  Weight: 122 lb (55.3 kg)   Pain Scale: 0-10 Pain Score: 0-No pain  Medications Reviewed Today     Reviewed by Rumalda Alan PENNER, RN (Registered Nurse) on 12/01/24 at 1016  Med List Status: <None>   Medication Order Taking? Sig Documenting Provider Last Dose Status Informant  acetaminophen  (TYLENOL ) 500 MG tablet [id number] Yes Take 1 tablet (500 mg total) by mouth every 8 (eight) hours as needed for moderate pain. Singh, Prashant K, MD  Active Self  aspirin  EC 81 MG tablet [id number] Yes Take 81 mg by mouth daily. [provider]  Active Self  calcium  carbonate (TUMS - DOSED IN MG ELEMENTAL CALCIUM ) 500 MG chewable tablet [id number] Yes Chew 2 tablets (400 mg of elemental calcium  total) by mouth daily as needed for indigestion or heartburn. Odell Celinda Balo, MD  Active Self  LORazepam  (ATIVAN ) 1 MG tablet [id number] Yes TAKE 1/2 TO 1 TABLET AS    NEEDED FOR ANXIETY (MAXIMUM ONCE DAILY) Chandra Toribio POUR, MD  Active Self  magnesium  sulfate 2 GM/50ML SOLN infusion [id number] Yes Infuse 2 grams IV at a  rate of 1 gram per hour Chandra Toribio POUR, MD  Active Self           Med Note JACKOLYN WADDELL VEAR Pablo Nov 13, 2024 10:06 AM) Infusions. Currently receiving once weekly. Is on a PRN basis.   metoprolol  tartrate (LOPRESSOR ) 25 MG tablet  [id number] Yes Take 0.5 tablets (12.5 mg total) by mouth 2 (two) times daily. West, Katlyn D, NP  Active Self  metroNIDAZOLE  (FLAGYL ) 250 MG tablet [id number] Yes Take 250 mg by mouth every morning. [provider]  Active Self  Multiple Vitamins-Minerals (MULTI FOR HER 50+) TABS [id number] Yes Take 1 tablet by mouth daily. [provider]  Active Self  ondansetron  (ZOFRAN -ODT) 4 MG disintegrating tablet [id number] Yes Take 1 tablet (4 mg total) by mouth every 8 (eight) hours as needed for nausea or vomiting. Rai, Nydia POUR, MD  Active   pantoprazole  (PROTONIX ) 40 MG tablet [id number] Yes Take 1 tablet (40 mg total) by mouth daily before breakfast. Rai, Ripudeep K, MD  Active   polyethylene glycol powder (GLYCOLAX /MIRALAX ) 17 GM/SCOOP powder [id number] Yes Take 17 g by mouth daily. [provider]  Active Self  pyridostigmine  (MESTINON ) 60 MG tablet [id number]  Take 30 mg by mouth daily.  Patient not taking: Reported on 12/01/2024   [provider]  Active Self           Med Note JACKOLYN WADDELL VEAR Pablo Nov 13, 2024 10:08 AM) Is on hold for 2 weeks. Last dose 12/18.  simvastatin  (ZOCOR ) 20 MG tablet [id number] Yes Take 1 tablet (20 mg total) by mouth at bedtime. [name], MD  Active Self  valsartan  (DIOVAN ) 320 MG tablet [id number]  Take 1 tablet (320 mg total) by mouth daily.  Patient not taking: Reported on 12/01/2024   [name], MD  Active Self            Goals Addressed             This Visit's Progress    [facility] Transitions of Care (TOC) Care Plan       Problems:  Recent Hospitalization for treatment of Small bowel obstruction  11/22/2024  Feeling well. Reports that she had episodes of bloating and diarrhea yesterday. Feels normal today.   Had magnesium  infusion. 12/01/2024-  patient reports feeling well. Got her results of CT and spoke with [facility] MD and new RX for budesonide.  Waiting for delivery of medication.   Several days of loose stools  but no pain. Concerns about not gaining weight.  Goal:  Over the next 30 days, the patient will not experience hospital readmission  Interventions:  Transitions of Care: Doctor Visits  - discussed the importance of doctor visits Reviewed Signs and symptoms of infection Reviewed all medication and encouraged patient to take as prescribed- reviewed pending start of new medication.  Encouraged bland diet, reviewed use of protein powder and or carination instant breakfast for additional calories.   Encouraged patient to be observant of bowel movements and color.- loose stools recently Encouraged patient to monitor BP frequently since BP medication on hold. Encouraged patient to call MD for abnormal readings.- has been self monitoring and BP in a good range. BP range 95-105/50-60.  Diovan  still on hold. Reviewed medications.  Is currently taking mirlax as needed depending on bowel function Encouraged hydration.  Offered a supportive listening ear as it is very frustrating trying to figure out what to eat- daughter is assisting.  Patient Self Care Activities:  Attend all scheduled provider appointments Call pharmacy for medication refills 3-7 days in advance of running out of medications Call provider office for new concerns or questions  Notify RN Care Manager of TOC call rescheduling needs Participate in Transition of Care Program/Attend TOC scheduled calls Perform all self care activities independently  Perform IADL's (shopping, preparing meals, housekeeping, managing finances) independently Take medications as prescribed   Take new medication when it arrives Continues to add calories to diet  Plan:  Telephone follow up appointment with care management team member scheduled for:  12/08/2024 at 10 am.         Recommendation:   Continue Current Plan of Care  Follow Up Plan:   Telephone follow up appointment date/time:  12/08/2024 at 10 am  Alan Ee, RN, BSN, [employer]- Transition of Care Team.  [facility] [phone]     "

## 2024-12-05 ENCOUNTER — Encounter (HOSPITAL_COMMUNITY)
Admission: RE | Admit: 2024-12-05 | Discharge: 2024-12-05 | Disposition: A | Source: Ambulatory Visit | Attending: Family Medicine

## 2024-12-05 MED ORDER — MAGNESIUM SULFATE 2 GM/50ML IV SOLN
INTRAVENOUS | Status: AC
  Filled 2024-12-05: qty 50

## 2024-12-05 MED ORDER — MAGNESIUM SULFATE 2 GM/50ML IV SOLN
2.0000 g | Freq: Once | INTRAVENOUS | Status: AC
  Administered 2024-12-05: 2 g via INTRAVENOUS

## 2024-12-08 ENCOUNTER — Other Ambulatory Visit: Payer: Self-pay

## 2024-12-08 NOTE — Transitions of Care (Post Inpatient/ED Visit) (Signed)
 " Transition of Care week 4  Visit Note  12/08/2024  Name: Andrea Brooks MRN: [id number]          DOB: 04/23/1936  Situation: Patient enrolled in [facility] 30-day program. Visit completed with patient by telephone.   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  Unknown reason.  No other symptoms or concerns. No changes in weight.  Patient Reported Symptoms: Cognitive Cognitive Status: Able to follow simple commands, Alert and oriented to person, place, and time, Normal speech and language skills      Neurological Neurological Review of Symptoms: No symptoms reported    HEENT HEENT Symptoms Reported: No symptoms reported      Cardiovascular Cardiovascular Symptoms Reported: No symptoms reported    Respiratory Respiratory Symptoms Reported: No symptoms reported    Endocrine Endocrine Symptoms Reported: No symptoms reported Is patient diabetic?: No Endocrine Self-Management Outcome: 5 (very good)  Gastrointestinal Gastrointestinal Symptoms Reported: Other Other Gastrointestinal Symptoms: Slimmy stools yesterday - clear in color and a rabbit pellet of stool that forcefully came out.   No nausea, vomiting or pain. Gastrointestinal Management Strategies: Medication therapy Gastrointestinal Self-Management Outcome: 4 (good)     Genitourinary Genitourinary Symptoms Reported: No symptoms reported    Integumentary Integumentary Symptoms Reported: No symptoms reported    Musculoskeletal Musculoskelatal Symptoms Reviewed: No symptoms reported        Psychosocial Psychosocial Symptoms Reported: No symptoms reported         There were no vitals filed for this visit. Pain Scale: 0-10 Pain Score: 0-No pain  Medications Reviewed Today     Reviewed by Rumalda Alan PENNER, RN (Registered Nurse) on 12/08/24 at 1004  Med List Status: <None>   Medication Order Taking? Sig Documenting Provider Last Dose Status Informant  acetaminophen  (TYLENOL ) 500 MG tablet [id number] Yes Take 1 tablet (500 mg total) by mouth every 8 (eight) hours as needed for moderate pain. Singh, Prashant K, MD  Active Self  aspirin  EC 81 MG tablet [id number] Yes Take 81 mg by mouth daily. [provider]  Active Self  calcium  carbonate (TUMS - DOSED IN MG ELEMENTAL CALCIUM ) 500 MG chewable tablet [id number] Yes Chew 2 tablets (400 mg of elemental calcium  total) by mouth daily as needed for indigestion or heartburn. Odell Celinda Balo, MD  Active Self  LORazepam  (ATIVAN ) 1 MG tablet [id number] Yes TAKE 1/2 TO 1 TABLET AS    NEEDED FOR ANXIETY (MAXIMUM ONCE DAILY) Chandra Toribio POUR, MD  Active Self  magnesium  sulfate 2 GM/50ML SOLN infusion [id number] Yes Infuse 2 grams IV at a rate of 1 gram per hour Chandra Toribio POUR, MD  Active Self           Med Note JACKOLYN WADDELL VEAR Pablo Nov 13, 2024 10:06 AM) Infusions. Currently receiving once weekly. Is on a PRN basis.   metoprolol  tartrate (LOPRESSOR ) 25 MG tablet [id number] Yes Take 0.5 tablets (12.5 mg total) by mouth 2 (two) times daily. West, Katlyn D, NP  Active Self  metroNIDAZOLE  (FLAGYL ) 250 MG tablet [id number] Yes Take 250 mg by mouth every morning. [provider]  Active Self  Multiple Vitamins-Minerals (MULTI FOR HER 50+) TABS [id number] Yes Take 1 tablet by mouth daily. [provider]  Active Self  ondansetron  (ZOFRAN -ODT) 4 MG disintegrating tablet [id number] Yes Take 1 tablet (4 mg total) by mouth every 8 (eight) hours as needed for nausea or vomiting. Rai, Nydia POUR, MD  Active   pantoprazole  (PROTONIX ) 40 MG tablet [id number] Yes Take 1 tablet (40 mg total) by mouth daily before breakfast. Rai, Ripudeep K, MD  Active   polyethylene glycol powder (GLYCOLAX /MIRALAX ) 17 GM/SCOOP powder [id number] Yes Take 17 g by mouth daily. [provider]  Active Self  pyridostigmine  (MESTINON ) 60 MG tablet [id number] Yes Take 30 mg by mouth daily. [provider]  Active Self           Med Note JACKOLYN WADDELL VEAR Pablo Nov 13, 2024 10:08 AM) Is on hold for 2 weeks. Last dose 12/18.  simvastatin  (ZOCOR ) 20 MG tablet [id number] Yes Take 1 tablet (20 mg total) by mouth at bedtime. [name], MD  Active Self  valsartan  (DIOVAN ) 320 MG tablet [id number] Yes Take 1 tablet (320 mg total) by mouth daily. [name], MD  Active Self            Goals Addressed             This Visit's Progress    [facility] Transitions of Care (TOC) Care Plan       Problems:  Recent Hospitalization for treatment of Small bowel obstruction  11/22/2024  Feeling well. Reports that she had episodes of bloating and diarrhea yesterday. Feels normal today.   Had magnesium  infusion. 12/01/2024-  patient reports feeling well. Got her results of CT and spoke with [facility] MD and new RX for budesonide.  Waiting for delivery of medication.   Several days of loose stools but no pain. Concerns about not gaining weight. 12/08/2024  Reports slimmy stools X2 yesterday.   No nausea, denies fever.  No abdominal pain.  Goal:  Over the next 30 days, the patient will not experience hospital readmission  Interventions:  Transitions of Care: Doctor Visits  - discussed the importance of doctor visits Reviewed Signs and symptoms of infection Reviewed all medication and encouraged patient to take as  prescribed- reviewed pending start of new medication.  Encouraged bland diet, reviewed use of protein powder and or carination instant breakfast for additional calories.   Encouraged patient to be observant of bowel movements and color.- unknown reason for slmiy stools yesterday.  Encouraged patient to monitor BP frequently since BP medication on hold.  Reviewed medications.  Is currently taking mirlax as needed depending on bowel function- took miralax  this am Encouraged hydration.  Encouraged patient to call MD about slimmy stool , reviewed warning signs of fever, NV or severe abdominal pain. Reviewed no change in weight  Patient Self Care Activities:  Attend all scheduled provider appointments Call pharmacy for medication refills 3-7 days in advance of running out of medications Call provider office for new concerns or questions  Notify RN Care Manager of [facility] call rescheduling needs Participate in Transition of Care Program/Attend  TOC scheduled calls Perform all self care activities independently  Perform IADL's (shopping, preparing meals, housekeeping, managing finances) independently Take medications as prescribed   Take new medication when it arrives- still has not arrived yet. Patient to call MD office today to inquire.    Plan:  Telephone follow up appointment with care management team member scheduled for:  12/14/2024 at 11am.         Recommendation:   Continue Current Plan of Care  Follow Up Plan:   Telephone follow up appointment date/time:  12/14/2024 at 11 am  Alan Ee, RN, BSN, [employer]- Transition of Care Team.  [facility] [phone]     "

## 2024-12-08 NOTE — Patient Instructions (Signed)
 Visit Information  Thank you for taking time to visit with me today. Please don't hesitate to contact me if I can be of assistance to you before our next scheduled telephone appointment.  Following are the goals we discussed today:   Goals Addressed             This Visit's Progress    [facility] Transitions of Care (TOC) Care Plan       Problems:  Recent Hospitalization for treatment of Small bowel obstruction  11/22/2024  Feeling well. Reports that she had episodes of bloating and diarrhea yesterday. Feels normal today.   Had magnesium  infusion. 12/01/2024-  patient reports feeling well. Got her results of CT and spoke with [facility] MD and new RX for budesonide.  Waiting for delivery of medication.   Several days of loose stools but no pain. Concerns about not gaining weight. 12/08/2024  Reports slimmy stools X2 yesterday.   No nausea, denies fever.  No abdominal pain.  Goal:  Over the next 30 days, the patient will not experience hospital readmission  Interventions:  Transitions of Care: Doctor Visits  - discussed the importance of doctor visits Reviewed Signs and symptoms of infection Reviewed all medication and encouraged patient to take as prescribed- reviewed pending start of new medication.  Encouraged bland diet, reviewed use of protein powder and or carination instant breakfast for additional calories.   Encouraged patient to be observant of bowel movements and color.- unknown reason for slmiy stools yesterday.  Encouraged patient to monitor BP frequently since BP medication on hold.  Reviewed medications.  Is currently taking mirlax as needed depending on bowel function- took miralax  this am Encouraged hydration.  Encouraged patient to call MD about slimmy stool , reviewed warning signs of fever, NV or severe abdominal pain. Reviewed no change in weight  Patient Self Care Activities:  Attend all scheduled provider appointments Call pharmacy for medication refills 3-7 days in advance  of running out of medications Call provider office for new concerns or questions  Notify RN Care Manager of TOC call rescheduling needs Participate in Transition of Care Program/Attend TOC scheduled calls Perform all self care activities independently  Perform IADL's (shopping, preparing meals, housekeeping, managing finances) independently Take medications as prescribed   Take new medication when it arrives- still has not arrived yet. Patient to call MD office today to inquire.    Plan:  Telephone follow up appointment with care management team member scheduled for:  12/14/2024 at 11am.          Our next appointment is by telephone on 12/14/2024 at 11 am  Please call the care guide team at [phone] if you need to cancel or reschedule your appointment.   If you are experiencing a Mental Health or Behavioral Health Crisis or need someone to talk to, please call the Suicide and Crisis Lifeline: 988 call the [place]  National Suicide Prevention Lifeline: 385-307-7923 or TTY: 424-247-3540 TTY (678)528-8323) to talk to a trained counselor call 1-800-273-TALK (toll free, 24 hour hotline) call 911   Care plan and visit instructions communicated with the patient verbally today. Patient agrees to receive a copy in MyChart. Active MyChart status and patient understanding of how to access instructions and care plan via MyChart confirmed with patient.     Alan Ee, RN, BSN, CEN [employer]- Transition of Care Team.  [facility] [phone]

## 2024-12-12 ENCOUNTER — Encounter (HOSPITAL_COMMUNITY)
Admission: RE | Admit: 2024-12-12 | Discharge: 2024-12-12 | Disposition: A | Source: Ambulatory Visit | Attending: Family Medicine

## 2024-12-12 MED ORDER — MAGNESIUM SULFATE 2 GM/50ML IV SOLN
INTRAVENOUS | Status: AC
  Filled 2024-12-12: qty 50

## 2024-12-12 MED ORDER — MAGNESIUM SULFATE 2 GM/50ML IV SOLN
2.0000 g | Freq: Once | INTRAVENOUS | Status: AC
  Administered 2024-12-12: 2 g via INTRAVENOUS

## 2024-12-13 NOTE — Telephone Encounter (Addendum)
 Patient called asking for instructions for Budesonide. She wrote them down, but lost them.  Instructions sent to patient via MyChart. Also called patient and spoke to her. Explained Budesonide 9 mg daily. Potentially decrease dose if able, but she should be on 9 mg for 3 months per Dr. Laurin. Stated understanding.

## 2024-12-14 ENCOUNTER — Other Ambulatory Visit: Payer: Self-pay

## 2024-12-14 NOTE — Patient Instructions (Signed)
 Visit Information  Thank you for taking time to visit with me today.   Following are the goals we discussed today:   Goals Addressed             This Visit's Progress    COMPLETED: [facility] Transitions of Care (TOC) Care Plan       Problems:  Recent Hospitalization for treatment of Small bowel obstruction  11/22/2024  Feeling well. Reports that she had episodes of bloating and diarrhea yesterday. Feels normal today.   Had magnesium  infusion. 12/01/2024-  patient reports feeling well. Got her results of CT and spoke with [facility] MD and new RX for budesonide.  Waiting for delivery of medication.   Several days of loose stools but no pain. Concerns about not gaining weight. 12/08/2024  Reports slimmy stools X2 yesterday.   No nausea, denies fever.  No abdominal pain. 12/14/2024  Patient reports that she is feeling well. Has had 3 BM's today and has started the Budesonide/ Encort on 12/08/2024.  Reports that she has crampy pain when she has a BM but no other concerns.  Reports no fever and NV.  Has stopped her smoothies at this time.  Weight unchanged.   Goal:  Over the next 30 days, the patient will not experience hospital readmission  Interventions:  Transitions of Care: Doctor Visits  - discussed the importance of doctor visits Reviewed Signs and symptoms of infection Reviewed all medication and encouraged patient to take as prescribed- reviewed that patient has started her new medication and is tolerating well.    Encouraged patient to be observant of bowel movements and color.- 3 BM's today Encouraged patient to monitor BP frequently since BP medication on hold.- BP medications remain on hold. BP readings are good per patient report. Reviewed medications.  Is currently taking mirlax as needed depending on bowel function-  Encouraged hydration.  Reviewed with patient that she has completed 30 day TOC program successful.  Reviewed ability to have a nurse complex case manager and patient declines. She  states that she has a good relationship with all her MD and can call them anytime she needs.    Patient Self Care Activities:  Attend all scheduled provider appointments Call pharmacy for medication refills 3-7 days in advance of running out of medications Call provider office for new concerns or questions  Perform all self care activities independently  Perform IADL's (shopping, preparing meals, housekeeping, managing finances) independently Take medications as prescribed     Plan:  Patient has completed TOC program successfully and declined CCM. .          If you are experiencing a Mental Health or Behavioral Health Crisis or need someone to talk to, please call the Suicide and Crisis Lifeline: 988 call the [place]  National Suicide Prevention Lifeline: (757)790-9158 or TTY: 419-604-4248 TTY (405)169-4410) to talk to a trained counselor call 1-800-273-TALK (toll free, 24 hour hotline) call 911   Care plan and visit instructions communicated with the patient verbally today. Patient agrees to receive a copy in MyChart. Active MyChart status and patient understanding of how to access instructions and care plan via MyChart confirmed with patient.     Alan Ee, RN, BSN, CEN [employer]- Transition of Care Team.  [facility] [phone]

## 2024-12-14 NOTE — Transitions of Care (Post Inpatient/ED Visit) (Signed)
 " Transition of Care Week 5  Visit Note  12/14/2024  Name: Andrea Brooks MRN: [id number]          DOB: Mar 25, 1936  Situation: Patient enrolled in [facility] 30-day program. Visit completed with patient by telephone.   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 Denies any new problems or concerns today.  Has completed TOC program successfully.  Patient Reported Symptoms: Cognitive Cognitive Status: Able to follow simple commands, Alert and oriented to person, place, and time, Normal speech and language skills      Neurological Neurological Review of Symptoms: No symptoms reported    HEENT HEENT Symptoms Reported: No symptoms reported      Cardiovascular Cardiovascular Symptoms Reported: No symptoms reported Does patient have uncontrolled Hypertension?: No Weight: 122 lb 12.8 oz (55.7 kg) Cardiovascular Self-Management Outcome: 5 (very good)  Respiratory Respiratory Symptoms Reported: No symptoms reported Respiratory Management Strategies: Routine screening  Endocrine Endocrine Symptoms Reported: No symptoms reported Is patient diabetic?: No Endocrine Self-Management Outcome: 5 (very good)  Gastrointestinal Gastrointestinal Symptoms Reported: Other Other Gastrointestinal Symptoms: BM this am X3. large stools.   Has started a new medication of encort and bowels are  moving well. Has stopped smoothies Gastrointestinal Management Strategies: Diet modification, Medication therapy Gastrointestinal Self-Management Outcome: 5 (very good)    Genitourinary Genitourinary Symptoms Reported: No symptoms reported Genitourinary Self-Management Outcome: 5 (very good)  Integumentary Integumentary Symptoms Reported: No symptoms reported Skin Management Strategies: Routine screening Skin Self-Management Outcome: 5 (very good)  Musculoskeletal Musculoskelatal Symptoms Reviewed: No symptoms reported Musculoskeletal Management Strategies: Routine screening Musculoskeletal Self-Management Outcome: 5 (very good)      Psychosocial Psychosocial Symptoms Reported: Not assessed         Today's Vitals   12/14/24 1104  Weight: 122 lb 12.8 oz (55.7 kg)   Pain Scale: 0-10 Pain Score: 0-No pain  Medications Reviewed Today     Reviewed by Rumalda Alan PENNER, RN (Registered Nurse) on 12/14/24 at 1106  Med List Status: <None>   Medication Order Taking? Sig Documenting Provider Last Dose Status Informant  acetaminophen  (TYLENOL ) 500 MG tablet [id number] Yes Take 1 tablet (500 mg total) by mouth every 8 (eight) hours as needed for moderate pain. Singh, Prashant K, MD  Active Self  aspirin  EC 81 MG tablet [id number] Yes Take 81 mg by mouth daily. [provider]  Active Self  budesonide (ENTOCORT EC) 3 MG 24 hr capsule [id number] Yes Take 9 mg by mouth daily. [provider]  Active   calcium  carbonate (TUMS - DOSED IN MG ELEMENTAL CALCIUM ) 500 MG chewable tablet [id number] Yes Chew 2 tablets (400 mg of elemental calcium  total) by mouth daily as needed for indigestion or heartburn. Odell Celinda Balo, MD  Active Self  furosemide  (LASIX ) 20 MG tablet [id number] Yes Take 20 mg  by mouth daily. [provider]  Active   LORazepam  (ATIVAN ) 1 MG tablet [id number] Yes TAKE 1/2 TO 1 TABLET AS    NEEDED FOR ANXIETY (MAXIMUM ONCE DAILY) Chandra Toribio POUR, MD  Active Self   magnesium  sulfate 2 GM/50ML SOLN infusion [id number] Yes Infuse 2 grams IV at a rate of 1 gram per hour Chandra Toribio POUR, MD  Active Self           Med Note JACKOLYN WADDELL VEAR Pablo Nov 13, 2024 10:06 AM) Infusions. Currently receiving once weekly. Is on a PRN basis.   metoprolol  tartrate (LOPRESSOR ) 25 MG tablet [id number] Yes Take 0.5 tablets (12.5 mg total) by mouth 2 (two) times daily. West, Katlyn D, NP  Active Self  metroNIDAZOLE  (FLAGYL ) 250 MG tablet [id number] Yes Take 250 mg by mouth every morning. [provider]  Active Self  Multiple Vitamins-Minerals (MULTI FOR HER 50+) TABS [id number] Yes Take 1 tablet by mouth daily. [provider]  Active Self  ondansetron  (ZOFRAN -ODT) 4 MG disintegrating tablet [id number] Yes Take 1 tablet (4 mg total) by mouth every 8 (eight) hours as needed for nausea or vomiting. Rai, Nydia POUR, MD  Active   pantoprazole  (PROTONIX ) 40 MG tablet [id number] Yes Take 1 tablet (40 mg total) by mouth daily before breakfast. Rai, Ripudeep K, MD  Active   polyethylene glycol powder (GLYCOLAX /MIRALAX ) 17 GM/SCOOP powder [id number] Yes Take 17 g by mouth daily. [provider]  Active Self  pyridostigmine  (MESTINON ) 60 MG tablet [id number]  Take 30 mg by mouth daily.  Patient not taking: Reported on 12/14/2024   [provider]  Active Self           Med Note JACKOLYN WADDELL VEAR Pablo Nov 13, 2024 10:08 AM) Is on hold for 2 weeks. Last dose 12/18.  simvastatin  (ZOCOR ) 20 MG tablet [id number] Yes Take 1 tablet (20 mg total) by mouth at bedtime. [name], MD  Active Self  valsartan  (DIOVAN ) 320 MG tablet [id number]  Take 1 tablet (320 mg total) by mouth daily.  Patient not taking: Reported on 12/14/2024   [name], MD  Active Self            Goals Addressed             This Visit's Progress    COMPLETED: [facility] Transitions of Care (TOC) Care Plan       Problems:  Recent Hospitalization for treatment of Small bowel  obstruction  11/22/2024  Feeling well. Reports that she had episodes of bloating and diarrhea yesterday. Feels normal today.   Had magnesium  infusion. 12/01/2024-  patient reports feeling well. Got her results of CT and spoke with [facility] MD and new RX for budesonide.  Waiting for delivery of medication.   Several days of loose stools but no pain. Concerns about not gaining weight. 12/08/2024  Reports slimmy stools X2 yesterday.   No nausea, denies fever.  No abdominal pain. 12/14/2024  Patient reports that she is feeling well. Has had 3 BM's today and has started the Budesonide/ Encort on 12/08/2024.  Reports that she has crampy pain when she has a BM but no other concerns.  Reports no fever and NV.  Has stopped her smoothies at this time.  Weight unchanged.   Goal:  Over the next 30 days, the patient will not experience hospital readmission  Interventions:  Transitions of Care: Doctor Visits  - discussed the importance of doctor visits Reviewed  Signs and symptoms of infection Reviewed all medication and encouraged patient to take as prescribed- reviewed that patient has started her new medication and is tolerating well.    Encouraged patient to be observant of bowel movements and color.- 3 BM's today Encouraged patient to monitor BP frequently since BP medication on hold.- BP medications remain on hold. BP readings are good per patient report. Reviewed medications.  Is currently taking mirlax as needed depending on bowel function-  Encouraged hydration.  Reviewed with patient that she has completed 30 day TOC program successful.  Reviewed ability to have a nurse complex case manager and patient declines. She states that she has a good relationship with all her MD and can call them anytime she needs.    Patient Self Care Activities:  Attend all scheduled provider appointments Call pharmacy for medication refills 3-7 days in advance of running out of medications Call provider office for new concerns or  questions  Perform all self care activities independently  Perform IADL's (shopping, preparing meals, housekeeping, managing finances) independently Take medications as prescribed     Plan:  Patient has completed TOC program successfully and declined CCM. SABRA          Recommendation:   Continue Current Plan of Care  Follow Up Plan:   Closing From:  Transitions of Care Program  Alan Ee, RN, BSN, [employer]- Transition of Care Team.  [facility] [phone]     "

## 2024-12-19 ENCOUNTER — Inpatient Hospital Stay (HOSPITAL_COMMUNITY)
Admission: RE | Admit: 2024-12-19 | Discharge: 2024-12-19 | Disposition: A | Source: Ambulatory Visit | Attending: Family Medicine

## 2024-12-19 ENCOUNTER — Ambulatory Visit: Payer: Self-pay | Admitting: Family Medicine

## 2024-12-19 LAB — MAGNESIUM: Magnesium: 1.4 mg/dL — ABNORMAL LOW (ref 1.7–2.4)

## 2024-12-19 MED ORDER — MAGNESIUM SULFATE 2 GM/50ML IV SOLN
2.0000 g | Freq: Once | INTRAVENOUS | Status: AC
  Administered 2024-12-19: 2 g via INTRAVENOUS

## 2024-12-19 MED ORDER — MAGNESIUM SULFATE 2 GM/50ML IV SOLN
INTRAVENOUS | Status: AC
  Filled 2024-12-19: qty 50

## 2024-12-21 ENCOUNTER — Other Ambulatory Visit (HOSPITAL_COMMUNITY): Payer: Self-pay

## 2024-12-26 ENCOUNTER — Inpatient Hospital Stay (HOSPITAL_COMMUNITY)
Admission: RE | Admit: 2024-12-26 | Discharge: 2024-12-26 | Disposition: A | Source: Ambulatory Visit | Attending: Family Medicine

## 2024-12-26 MED ORDER — MAGNESIUM SULFATE 2 GM/50ML IV SOLN
2.0000 g | Freq: Once | INTRAVENOUS | Status: AC
  Administered 2024-12-26: 2 g via INTRAVENOUS

## 2024-12-26 MED ORDER — MAGNESIUM SULFATE 2 GM/50ML IV SOLN
INTRAVENOUS | Status: AC
  Filled 2024-12-26: qty 50

## 2025-01-02 ENCOUNTER — Encounter (HOSPITAL_COMMUNITY)

## 2025-01-09 ENCOUNTER — Encounter (HOSPITAL_COMMUNITY)

## 2025-01-16 ENCOUNTER — Encounter (HOSPITAL_COMMUNITY)

## 2025-01-24 ENCOUNTER — Ambulatory Visit: Admitting: Internal Medicine

## 2025-01-29 ENCOUNTER — Ambulatory Visit: Admitting: Family Medicine

## 2025-06-26 ENCOUNTER — Ambulatory Visit: Admitting: Dermatology

## 2025-09-06 ENCOUNTER — Ambulatory Visit
